# Patient Record
Sex: Male | Born: 1940 | Race: White | Hispanic: No | Smoking: Current every day smoker
Health system: Southern US, Community
[De-identification: ages and names within clinical notes are randomized; demographics above are authoritative.]

## PROBLEM LIST (undated history)

## (undated) DIAGNOSIS — K219 Gastro-esophageal reflux disease without esophagitis: Secondary | ICD-10-CM

## (undated) DIAGNOSIS — N400 Enlarged prostate without lower urinary tract symptoms: Secondary | ICD-10-CM

## (undated) DIAGNOSIS — D494 Neoplasm of unspecified behavior of bladder: Secondary | ICD-10-CM

## (undated) DIAGNOSIS — J449 Chronic obstructive pulmonary disease, unspecified: Secondary | ICD-10-CM

## (undated) DIAGNOSIS — Z8719 Personal history of other diseases of the digestive system: Secondary | ICD-10-CM

## (undated) DIAGNOSIS — Z8711 Personal history of peptic ulcer disease: Secondary | ICD-10-CM

## (undated) DIAGNOSIS — F419 Anxiety disorder, unspecified: Secondary | ICD-10-CM

## (undated) DIAGNOSIS — M549 Dorsalgia, unspecified: Secondary | ICD-10-CM

## (undated) DIAGNOSIS — R51 Headache: Secondary | ICD-10-CM

## (undated) DIAGNOSIS — G8929 Other chronic pain: Secondary | ICD-10-CM

## (undated) DIAGNOSIS — E7800 Pure hypercholesterolemia, unspecified: Secondary | ICD-10-CM

## (undated) DIAGNOSIS — Z720 Tobacco use: Secondary | ICD-10-CM

## (undated) DIAGNOSIS — E785 Hyperlipidemia, unspecified: Secondary | ICD-10-CM

## (undated) DIAGNOSIS — C801 Malignant (primary) neoplasm, unspecified: Secondary | ICD-10-CM

## (undated) DIAGNOSIS — R911 Solitary pulmonary nodule: Secondary | ICD-10-CM

## (undated) DIAGNOSIS — F32A Depression, unspecified: Secondary | ICD-10-CM

## (undated) DIAGNOSIS — J439 Emphysema, unspecified: Secondary | ICD-10-CM

## (undated) DIAGNOSIS — F329 Major depressive disorder, single episode, unspecified: Secondary | ICD-10-CM

## (undated) HISTORY — PX: CHOLECYSTECTOMY: SHX55

## (undated) HISTORY — DX: Major depressive disorder, single episode, unspecified: F32.9

## (undated) HISTORY — DX: Personal history of other diseases of the digestive system: Z87.19

## (undated) HISTORY — PX: FRACTURE SURGERY: SHX138

## (undated) HISTORY — DX: Depression, unspecified: F32.A

## (undated) HISTORY — DX: Personal history of peptic ulcer disease: Z87.11

---

## 1999-01-22 ENCOUNTER — Encounter: Admission: RE | Admit: 1999-01-22 | Discharge: 1999-02-01 | Payer: Self-pay

## 2000-08-18 ENCOUNTER — Emergency Department (HOSPITAL_COMMUNITY): Admission: EM | Admit: 2000-08-18 | Discharge: 2000-08-19 | Payer: Self-pay | Admitting: Emergency Medicine

## 2000-08-19 ENCOUNTER — Encounter (INDEPENDENT_AMBULATORY_CARE_PROVIDER_SITE_OTHER): Payer: Self-pay | Admitting: Specialist

## 2000-08-19 ENCOUNTER — Inpatient Hospital Stay (HOSPITAL_COMMUNITY): Admission: EM | Admit: 2000-08-19 | Discharge: 2000-08-22 | Payer: Self-pay

## 2000-08-19 ENCOUNTER — Encounter: Payer: Self-pay | Admitting: Pulmonary Disease

## 2000-08-20 ENCOUNTER — Encounter: Payer: Self-pay | Admitting: Pulmonary Disease

## 2000-09-18 ENCOUNTER — Ambulatory Visit: Admission: RE | Admit: 2000-09-18 | Discharge: 2000-09-18 | Payer: Self-pay | Admitting: Pulmonary Disease

## 2000-09-18 ENCOUNTER — Encounter: Payer: Self-pay | Admitting: Pulmonary Disease

## 2002-07-06 ENCOUNTER — Encounter: Payer: Self-pay | Admitting: *Deleted

## 2002-07-07 ENCOUNTER — Encounter: Payer: Self-pay | Admitting: Pulmonary Disease

## 2002-07-07 ENCOUNTER — Inpatient Hospital Stay (HOSPITAL_COMMUNITY): Admission: EM | Admit: 2002-07-07 | Discharge: 2002-07-11 | Payer: Self-pay | Admitting: Emergency Medicine

## 2002-07-08 ENCOUNTER — Encounter (INDEPENDENT_AMBULATORY_CARE_PROVIDER_SITE_OTHER): Payer: Self-pay | Admitting: *Deleted

## 2002-07-08 ENCOUNTER — Encounter: Payer: Self-pay | Admitting: Pulmonary Disease

## 2002-07-22 ENCOUNTER — Encounter: Payer: Self-pay | Admitting: Orthopedic Surgery

## 2002-07-22 ENCOUNTER — Observation Stay (HOSPITAL_COMMUNITY): Admission: RE | Admit: 2002-07-22 | Discharge: 2002-07-23 | Payer: Self-pay | Admitting: Orthopedic Surgery

## 2003-06-02 ENCOUNTER — Observation Stay (HOSPITAL_COMMUNITY): Admission: EM | Admit: 2003-06-02 | Discharge: 2003-06-02 | Payer: Self-pay | Admitting: Emergency Medicine

## 2003-12-29 ENCOUNTER — Observation Stay (HOSPITAL_COMMUNITY): Admission: EM | Admit: 2003-12-29 | Discharge: 2003-12-30 | Payer: Self-pay | Admitting: Emergency Medicine

## 2004-07-15 ENCOUNTER — Observation Stay (HOSPITAL_COMMUNITY): Admission: EM | Admit: 2004-07-15 | Discharge: 2004-07-16 | Payer: Self-pay | Admitting: Emergency Medicine

## 2004-08-03 ENCOUNTER — Ambulatory Visit: Payer: Self-pay | Admitting: Internal Medicine

## 2004-08-03 ENCOUNTER — Emergency Department (HOSPITAL_COMMUNITY): Admission: EM | Admit: 2004-08-03 | Discharge: 2004-08-03 | Payer: Self-pay | Admitting: Emergency Medicine

## 2004-08-10 ENCOUNTER — Encounter: Payer: Self-pay | Admitting: Pulmonary Disease

## 2004-08-10 ENCOUNTER — Ambulatory Visit: Payer: Self-pay | Admitting: Internal Medicine

## 2005-04-17 ENCOUNTER — Inpatient Hospital Stay (HOSPITAL_COMMUNITY): Admission: EM | Admit: 2005-04-17 | Discharge: 2005-04-18 | Payer: Self-pay | Admitting: Emergency Medicine

## 2005-04-17 ENCOUNTER — Ambulatory Visit: Payer: Self-pay | Admitting: Cardiology

## 2009-12-16 ENCOUNTER — Encounter: Payer: Self-pay | Admitting: Pulmonary Disease

## 2009-12-16 ENCOUNTER — Emergency Department (HOSPITAL_COMMUNITY): Admission: EM | Admit: 2009-12-16 | Discharge: 2009-12-16 | Payer: Self-pay | Admitting: Emergency Medicine

## 2009-12-21 ENCOUNTER — Encounter: Payer: Self-pay | Admitting: Pulmonary Disease

## 2009-12-24 ENCOUNTER — Emergency Department (HOSPITAL_COMMUNITY): Admission: EM | Admit: 2009-12-24 | Discharge: 2009-12-24 | Payer: Self-pay | Admitting: Emergency Medicine

## 2009-12-26 DIAGNOSIS — J449 Chronic obstructive pulmonary disease, unspecified: Secondary | ICD-10-CM

## 2009-12-26 DIAGNOSIS — I10 Essential (primary) hypertension: Secondary | ICD-10-CM | POA: Insufficient documentation

## 2009-12-26 DIAGNOSIS — J4489 Other specified chronic obstructive pulmonary disease: Secondary | ICD-10-CM | POA: Insufficient documentation

## 2009-12-26 DIAGNOSIS — M1990 Unspecified osteoarthritis, unspecified site: Secondary | ICD-10-CM | POA: Insufficient documentation

## 2009-12-27 ENCOUNTER — Ambulatory Visit: Payer: Self-pay | Admitting: Pulmonary Disease

## 2009-12-27 DIAGNOSIS — J984 Other disorders of lung: Secondary | ICD-10-CM

## 2010-01-01 ENCOUNTER — Ambulatory Visit (HOSPITAL_COMMUNITY): Admission: RE | Admit: 2010-01-01 | Discharge: 2010-01-01 | Payer: Self-pay | Admitting: Pulmonary Disease

## 2010-01-03 ENCOUNTER — Encounter: Payer: Self-pay | Admitting: Pulmonary Disease

## 2010-01-09 ENCOUNTER — Ambulatory Visit (HOSPITAL_COMMUNITY): Admission: RE | Admit: 2010-01-09 | Discharge: 2010-01-09 | Payer: Self-pay | Admitting: Pulmonary Disease

## 2010-01-15 ENCOUNTER — Ambulatory Visit: Payer: Self-pay | Admitting: Pulmonary Disease

## 2010-01-19 ENCOUNTER — Ambulatory Visit: Payer: Self-pay | Admitting: Pulmonary Disease

## 2010-01-22 ENCOUNTER — Telehealth: Payer: Self-pay | Admitting: Pulmonary Disease

## 2010-01-29 ENCOUNTER — Telehealth: Payer: Self-pay | Admitting: Pulmonary Disease

## 2010-01-30 ENCOUNTER — Ambulatory Visit: Payer: Self-pay | Admitting: Cardiology

## 2010-01-31 ENCOUNTER — Ambulatory Visit: Payer: Self-pay | Admitting: Emergency Medicine

## 2010-01-31 ENCOUNTER — Ambulatory Visit (HOSPITAL_COMMUNITY): Admission: RE | Admit: 2010-01-31 | Discharge: 2010-01-31 | Payer: Self-pay | Admitting: Emergency Medicine

## 2010-02-08 ENCOUNTER — Telehealth (INDEPENDENT_AMBULATORY_CARE_PROVIDER_SITE_OTHER): Payer: Self-pay | Admitting: *Deleted

## 2010-02-09 ENCOUNTER — Ambulatory Visit: Payer: Self-pay | Admitting: Pulmonary Disease

## 2010-05-04 ENCOUNTER — Other Ambulatory Visit: Payer: Self-pay | Admitting: Pulmonary Disease

## 2010-05-04 DIAGNOSIS — R911 Solitary pulmonary nodule: Secondary | ICD-10-CM

## 2010-05-05 ENCOUNTER — Encounter: Payer: Self-pay | Admitting: Pulmonary Disease

## 2010-05-17 NOTE — Miscellaneous (Signed)
Summary: Orders Update   Clinical Lists Changes  Orders: Added new Referral order of Radiology Referral (Radiology) - Signed 

## 2010-05-17 NOTE — Assessment & Plan Note (Signed)
Summary: consult for RUL nodule.   Visit Type:  Initial Consult Copy to:  Prudy Feeler Primary Provider/Referring Provider:  Prudy Feeler MD  CC:  pulmonary consult. Pt finished prednisone taper yesterday.  History of Present Illness: The pt is a 70y/o male who I have been asked to see for a pulmonary nodule.  He recently presented to the ER for hoarseness, and underwent a ct neck that including a few cuts thru the lung apices.  This revealed a 7mm nodular density in the right apex with surrounding significant emphysematous changes.  He was subsequently referred for consultation.  The pt has a long h/o smoking, and continues to do so.  He has had a history of hemoptysis in the distant past, but has not been an issue recently.  He denies any weight loss or anorexia.  He denies any h/o known TB exposure, and has had a negative skin test in the past.  He has never lived outside of [place].  He has known moderate emphysema by pfts in 2002.  He has not been tested since.    Preventive Screening-Counseling & Management  Alcohol-Tobacco     Smoking Status: current     Smoking Cessation Counseling: yes     Packs/Day: 0.5     Tobacco Counseling: to quit use of tobacco products  Current Medications (verified): 1)  Xanax 1 Mg Tabs (Alprazolam) .... One Tab Two Times A Day 2)  Nitrostat 0.4 Mg Subl (Nitroglycerin) .... As Directed 3)  Prilosec 20 Mg Cpdr (Omeprazole) .... One Tab Two Times A Day 4)  Lortab 10-500 Mg Tabs (Hydrocodone-Acetaminophen) .... One Tab Three Times A Day 5)  Combivent 18-103 Mcg/act Aero (Ipratropium-Albuterol) .... 2 Puffs Four Times A Day As Needed 6)  Symbicort 160-4.5 Mcg/act Aero (Budesonide-Formoterol Fumarate) .... 2 Puffs Two Times A Day 7)  Mobic 7.5 Mg Tabs (Meloxicam) .... One Tab Once Daily 8)  Albuterol Sulfate (2.5 Mg/52ml) 0.083% Nebu (Albuterol Sulfate) .... Use One Vial Once Daily 9)  Amitriptyline Hcl 50 Mg Tabs (Amitriptyline Hcl) .Marland Kitchen.. 1 Tablet At Bedtime 10)   Calcium-Vitamin D 600-200 Mg-Unit Tabs (Calcium-Vitamin D) .... Once Daily 11)  Vitamin D3 1000 Unit Caps (Cholecalciferol) .... Once Daily 12)  Simvastatin 40 Mg Tabs (Simvastatin) .... Once Daily 13)  Garlic Oil 1000 Mg Caps (Garlic) .... Once Daily  Allergies (verified): 1)  ! * Ivp Dye  Past History:  Past Medical History: OSTEOARTHRITIS (ICD-715.90) HYPERTENSION (ICD-401.9) Moderate emphysema--pfts 2002 with Fev1% 60.  Past Surgical History: Finger surgery--pin placed in finger  Family History: Reviewed history and no changes required. Asthma--daughter  Social History: Reviewed history and no changes required. 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 Start 1952. 2-3 ppd. Pt states currently smokes <1/2ppd Smoking Status:  current Packs/Day:  0.5  Review of Systems       The patient complains of shortness of breath with activity, shortness of breath at rest, productive cough, sore throat, itching, and rash.  The patient denies non-productive cough, coughing up blood, chest pain, irregular heartbeats, acid heartburn, indigestion, loss of appetite, weight change, abdominal pain, difficulty swallowing, tooth/dental problems, headaches, nasal congestion/difficulty breathing through nose, sneezing, ear ache, anxiety, depression, hand/feet swelling, joint stiffness or pain, change in color of mucus, and fever.    Vital Signs:  Patient profile:   70 year old male Height:      64 inches Weight:      166 pounds BMI:     28.60 O2 Sat:  93 % on Room air Temp:     97.9 degrees F oral Pulse rate:   76 / minute BP sitting:   110 / 60  (right arm) Cuff size:   regular  Vitals Entered By: Carver Fila (December 27, 2009 2:13 PM)  O2 Flow:  Room air CC: pulmonary consult. Pt finished prednisone taper yesterday Comments meds and allergies updated Phone number updated Carver Fila  December 27, 2009 2:13 PM    Physical Exam  General:   wd male in nad Eyes:  PERRLA and EOMI.   Nose:  deviation to left without purulence or obstruction Mouth:  clear  Neck:  no jvd, tmg, LN Lungs:  decreased bs throughout, no wheezing  mild rhonchi Heart:  rrr, no mrg Abdomen:  soft and nontender, bs+ Extremities:  no edema noted, no cyanosis  pulses decreased but present Neurologic:  alert and oriented, moves all 4.   Impression & Recommendations:  Problem # 1:  PULMONARY NODULE (ICD-518.89) the pt has a 7mm nodule in right apex on ct neck as an incidental finding.  It is unclear whether this is a scar vs possible early bronchogenic cancer.  I have outlined the 2 paths for management, including surveillance vs biopsy.  It is too small to be accurately characterized by PET, but the pt does need to have a complete ct chest to make sure no other abnormal findings in the chest.  If his abnormalities are isolated to those seen on neck ct, I have recommened a f/u ct chest at 4mos, then 6mos, then one year.  If he wished to be more aggressive, I would be hesitant to do TTNA due to high ptx risk, would not do surgical wedge resection without knowing it was definitely a cancer, and could approach with ENB (which would still have ptx risk with surrounding emphysematous changes).  After a long discussion with the pt and his son, the decision was made to do full ct chest, then do surveillance if nothing new is found.  He understands that we cannot exclude cancer, and that he needs close f/u.  I have also talked with him about his smoking, and asked him to quit.  Medications Added to Medication List This Visit: 1)  Amitriptyline Hcl 50 Mg Tabs (Amitriptyline hcl) .Marland Kitchen.. 1 tablet at bedtime 2)  Calcium-vitamin D 600-200 Mg-unit Tabs (Calcium-vitamin d) .... Once daily 3)  Vitamin D3 1000 Unit Caps (Cholecalciferol) .... Once daily 4)  Simvastatin 40 Mg Tabs (Simvastatin) .... Once daily 5)  Garlic Oil 1000 Mg Caps (Garlic) .... Once daily  Other  Orders: Consultation Level IV (66440) Radiology Referral (Radiology)  Patient Instructions: 1)  will set up for full ct chest.  Will call you with results.  If unrremarkable except for your known spot, will do followup scan in 4mos.  Will call to discuss with you 2)  stop smoking.   Immunization History:  Influenza Immunization History:    Influenza:  historical (01/13/2009)   Appended Document: consult for RUL nodule. the pt's ct neck showed the pulmonary nodule was 11mm in diameter, not 7mm (that was thyroid lesion). await complete ct chest.

## 2010-05-17 NOTE — Miscellaneous (Signed)
Summary: Orders Update-pft charges//jwr  Clinical Lists Changes  Orders: Added new Service order of Carbon Monoxide diffusing w/capacity (94720) - Signed Added new Service order of Lung Volumes (94240) - Signed Added new Service order of Spirometry (Pre & Post) (94060) - Signed 

## 2010-05-17 NOTE — Assessment & Plan Note (Signed)
Summary: rov for copd, RUL nodules   Visit Type:  Follow-up Copy to:  Prudy Feeler Primary Yanni Quiroa/Referring Coretta Leisey:  Prudy Feeler MD  CC:  pt here to discuss options. pt is not having any problems. pt currently smokes 1/2 ppd. .  History of Present Illness: The pt comes in today for f/u of his RUL abnormality on ct chest.  He has recently undergone ENB, which was nondiagnostic, and is here to review further options for treatment. He has not had any complications from his recent procedure, and feels he is breathing well.  Current Medications (verified): 1)  Xanax 1 Mg Tabs (Alprazolam) .... One Tab Two Times A Day 2)  Nitrostat 0.4 Mg Subl (Nitroglycerin) .... As Directed 3)  Prilosec 20 Mg Cpdr (Omeprazole) .... One Tab Two Times A Day 4)  Lortab 10-500 Mg Tabs (Hydrocodone-Acetaminophen) .... One Tab Three Times A Day 5)  Combivent 18-103 Mcg/act Aero (Ipratropium-Albuterol) .... 2 Puffs Four Times A Day As Needed 6)  Symbicort 160-4.5 Mcg/act Aero (Budesonide-Formoterol Fumarate) .... 2 Puffs Two Times A Day 7)  Mobic 7.5 Mg Tabs (Meloxicam) .... One Tab Once Daily 8)  Albuterol Sulfate (2.5 Mg/26ml) 0.083% Nebu (Albuterol Sulfate) .... Use One Vial Once Daily 9)  Amitriptyline Hcl 50 Mg Tabs (Amitriptyline Hcl) .Marland Kitchen.. 1 Tablet At Bedtime 10)  Calcium-Vitamin D 600-200 Mg-Unit Tabs (Calcium-Vitamin D) .... Once Daily 11)  Vitamin D3 1000 Unit Caps (Cholecalciferol) .... Once Daily 12)  Simvastatin 40 Mg Tabs (Simvastatin) .... Once Daily 13)  Garlic Oil 1000 Mg Caps (Garlic) .... Once Daily  Allergies (verified): 1)  ! * Ivp Dye  Review of Systems       The patient complains of productive cough, non-productive cough, and nasal congestion/difficulty breathing through nose.  The patient denies shortness of breath with activity, shortness of breath at rest, coughing up blood, chest pain, irregular heartbeats, acid heartburn, indigestion, loss of appetite, weight change, abdominal pain,  difficulty swallowing, sore throat, tooth/dental problems, headaches, sneezing, itching, ear ache, anxiety, depression, hand/feet swelling, joint stiffness or pain, rash, change in color of mucus, and fever.    Vital Signs:  Patient profile:   70 year old male Height:      64 inches Weight:      163.50 pounds BMI:     28.17 O2 Sat:      93 % on Room air Temp:     97.7 degrees F oral Pulse rate:   75 / minute BP sitting:   120 / 70  (left arm) Cuff size:   regular  Vitals Entered By: Carver Fila (February 09, 2010 2:40 PM)  O2 Flow:  Room air CC: pt here to discuss options. pt is not having any problems. pt currently smokes 1/2 ppd.  Comments meds and allergies updated Phone number updated Carver Fila  February 09, 2010 2:43 PM    Physical Exam  General:  wd male in nad Lungs:  decreased bs but no wheezing Heart:  rrr Extremities:  no edema or cyanosis  Neurologic:  alert and oriented, moves all 4.   Impression & Recommendations:  Problem # 1:  PULMONARY NODULE (ICD-518.89) the pt has a RUL nodule that appears more inflammatory on ct chest, but does have an increased PET signature (though at lower level).  His ENB was nondiagnostic, and would not do a TTNA given his severe emphysematous changes with high risk for PTX and BPF.  He is not a surgical candidate currently given his severe  obstruction on pfts, but these may improve if he were to totally quit smoking.  At this point, would do surveillance in a short time interval.  I am still not convinced this represents a malignancy, but will follow closely.  The pt is totally agreeable to this approach   Problem # 2:  C O P D (ICD-496) the pt has severe disease by pfts, but is still smoking.  I have suggested to him that his pfts would probably improve with smoking cessation, and may make him more of a surgical candidate in the future.  I have asked him to continue on his current meds.  Other Orders: Est. Patient Level III  (62952) Radiology Referral (Radiology)  Patient Instructions: 1)  will do a followup ct chest in 4mos.  Will call you with results. 2)  stop smoking.

## 2010-05-17 NOTE — Assessment & Plan Note (Signed)
Summary: rov to discuss pet scan/ms   Primary Provider/Referring Provider:  Prudy Feeler MD  CC:  pt is here to discuss his pet scan results. Pt states he smoke 2-3 cigarettes a day. Marland Kitchen  History of Present Illness: the pt comes in today for f/u of his recent PET scan.  He has 2 areas in the RUL, but the more concerning one more anteriorly in RUL was not PET +,while the lesion more posteriorly in RUL did have increased uptake.  I have discussed this at great length with the pt, and answered all of his questions.  I have also reviewed the images with him and his family member.  Current Medications (verified): 1)  Xanax 1 Mg Tabs (Alprazolam) .... One Tab Two Times A Day 2)  Nitrostat 0.4 Mg Subl (Nitroglycerin) .... As Directed 3)  Prilosec 20 Mg Cpdr (Omeprazole) .... One Tab Two Times A Day 4)  Lortab 10-500 Mg Tabs (Hydrocodone-Acetaminophen) .... One Tab Three Times A Day 5)  Combivent 18-103 Mcg/act Aero (Ipratropium-Albuterol) .... 2 Puffs Four Times A Day As Needed 6)  Symbicort 160-4.5 Mcg/act Aero (Budesonide-Formoterol Fumarate) .... 2 Puffs Two Times A Day 7)  Mobic 7.5 Mg Tabs (Meloxicam) .... One Tab Once Daily 8)  Albuterol Sulfate (2.5 Mg/69ml) 0.083% Nebu (Albuterol Sulfate) .... Use One Vial Once Daily 9)  Amitriptyline Hcl 50 Mg Tabs (Amitriptyline Hcl) .Marland Kitchen.. 1 Tablet At Bedtime 10)  Calcium-Vitamin D 600-200 Mg-Unit Tabs (Calcium-Vitamin D) .... Once Daily 11)  Vitamin D3 1000 Unit Caps (Cholecalciferol) .... Once Daily 12)  Simvastatin 40 Mg Tabs (Simvastatin) .... Once Daily 13)  Garlic Oil 1000 Mg Caps (Garlic) .... Once Daily  Allergies (verified): 1)  ! * Ivp Dye  Review of Systems       The patient complains of productive cough and sneezing.  The patient denies shortness of breath with activity, shortness of breath at rest, non-productive cough, coughing up blood, chest pain, irregular heartbeats, acid heartburn, indigestion, loss of appetite, weight change, abdominal  pain, difficulty swallowing, sore throat, tooth/dental problems, headaches, nasal congestion/difficulty breathing through nose, ear ache, anxiety, depression, hand/feet swelling, joint stiffness or pain, rash, change in color of mucus, and fever.    Vital Signs:  Patient profile:   70 year old male Height:      64 inches Weight:      162.38 pounds O2 Sat:      95 % on Room air Temp:     97.8 degrees F oral Pulse rate:   81 / minute BP sitting:   118 / 72  (right arm) Cuff size:   regular  Vitals Entered By: Carver Fila (January 15, 2010 1:26 PM)  O2 Flow:  Room air CC: pt is here to discuss his pet scan results. Pt states he smoke 2-3 cigarettes a day.  Comments meds and allergies updated Phone number updated Carver Fila  January 15, 2010 1:31 PM    Physical Exam  General:  wd male in nad   Impression & Recommendations:  Problem # 1:  PULMONARY NODULE (ICD-518.89) the pt has 2 abnormal areas in the RUL, with one showing increased uptake on PET.  This is very concerning for a malignancy.  I have reviewed the various approaches with the pt, including surveillance, TTNA, ENB, and surgery.  He obviously has significant emphysema by xray and on clinical grounds, therefore will need a tissue diagnosis if possible before considering any type of surgical intervention (will also need  pfts to see if even a candidate).  I think he is at high risk for ptx and possible persistent air leak with TTNA, given the surrounding blebs btw lesion and chest wall.  I think the safest and best approach is ENB.  I have reviewed the procedure with him, and he agrees.  He understands there is still a risk of ptx, but much less than TTNA.  Time spent with pt discussing the above was .  Other Orders: Est. Patient Level III (96295) Pulmonary Referral (Pulmonary)  Patient Instructions: 1)  will schedule for breathing studies, and will let you know the results. 2)  will schedule for bronchoscopy with GPS  guidance.  I will call you with the scheduling date.   Immunization History:  Pneumovax Immunization History:    Pneumovax:  historical (01/13/2009)

## 2010-05-17 NOTE — Progress Notes (Signed)
Summary: pt coming in for ov  Phone Note Call from Patient Call back at Home Phone [phone]   Caller: Patient Call For: clance Summary of Call: returning call to mindy Initial call taken by: Lacinda Axon,  February 08, 2010 12:54 PM  Follow-up for Phone Call        called and spoke with pt an trying to figure out a time for pt to come in and see [facility]. sent a flag to k[facility] and awaiting for reply.  Carver Fila  February 08, 2010 1:41 PM   Additional Follow-up for Phone Call Additional follow up Details #1::        pt coming in on 10/28 at 2:45. pt aware of ov Carver Fila  February 08, 2010 4:46 PM

## 2010-05-17 NOTE — Progress Notes (Signed)
  Phone Note Call from Patient Call back at Home Phone [phone]   Caller: Archie Patten- FRIEND OF PT Call For: [facility] Summary of Call: REQUESTS TO SPEAK TO LIBBY. PT SAYS SOMEONE FROM [facility] CALLED RE: "LUNG" SURGERY DATE FOR 10/19/? AND SAYS THAT PT IS UNAWARE OF PENDING SURGERY.( PT WAS TALKING IN THE BACKGROUND AS TANYA WAS SPEAKING TO ME) Initial call taken by: Tivis Ringer, CNA,  January 22, 2010 12:07 PM  Follow-up for Phone Call        someone fr[facility] called this am and needed info from him for surgery on 01/31/10 and they are upset don't know what is going on ct [email or web address]  01/25/10 Follow-up by: Oneita Jolly,  January 22, 2010 12:15 PM  Additional Follow-up for Phone Call Additional follow up Details #1::        called and left message for patient on machine explaining that surgery didn't mean an operation.  explained this was his bronchoscopy that we have already discussed.  Will try to call him back. Additional Follow-up by: Barbaraann Share MD,  January 22, 2010 4:48 PM

## 2010-05-17 NOTE — Progress Notes (Signed)
Summary: ? about appt-pt and son called  Phone Note Call from Patient Call back at [facility] Phone [phone]   Caller: Patient Call For: clance Summary of Call: pt have questions about were he is suppose to have appt and labs. Initial call taken by: Rickard Patience,  January 29, 2010 11:13 AM  Follow-up for Phone Call        son tuff clabo called back re: same. says pt believes he is to have labs done today. is this correct? call same # (pt. home). Tivis Ringer, CNA  January 29, 2010 11:38 AM  Dr. Shelle Iron is aware of this and is takin gcare of this pt.Jennifer [facility] CMA  January 29, 2010 12:45 PM

## 2010-05-17 NOTE — Progress Notes (Signed)
Summary: [facility]   Imported By: Sherian Rein 12/28/2009 09:15:33  _____________________________________________________________________  External Attachment:    Type:   Image     Comment:   External Document

## 2010-05-25 ENCOUNTER — Telehealth (INDEPENDENT_AMBULATORY_CARE_PROVIDER_SITE_OTHER): Payer: Self-pay | Admitting: *Deleted

## 2010-05-28 ENCOUNTER — Other Ambulatory Visit (HOSPITAL_COMMUNITY): Payer: Self-pay

## 2010-05-31 NOTE — Progress Notes (Signed)
Summary: needs physician orders faxed for CT on 2.13.12  Phone Note Other Incoming Call back at [phone]   Caller: Rebecka Apley w/  [facility] radiology Summary of Call: CT scheduled for Monday @ 1130.  needs physician orders faxed to [phone].  per EMR, order was faxed on 1.30.12, but Rebecka Apley states with all the "transitions" it was not received.  [facility] pt. Initial call taken by: Boone Master CNA/MA,  May 25, 2010 2:38 PM  Follow-up for Phone Call        will forward message to [facility] to resend order for CT chest.  Arman Filter LPN  May 25, 2010 3:00 PM   Additional Follow-up for Phone Call Additional follow up Details #1::        order was faxed 02/09/10 but refaxed it to [phone] Additional Follow-up by: Oneita Jolly,  May 25, 2010 3:24 PM

## 2010-06-15 ENCOUNTER — Other Ambulatory Visit (HOSPITAL_COMMUNITY): Payer: Self-pay

## 2010-06-21 ENCOUNTER — Ambulatory Visit (HOSPITAL_COMMUNITY)
Admission: RE | Admit: 2010-06-21 | Discharge: 2010-06-21 | Disposition: A | Discharge status: Home / Self Care | Payer: Medicare Other | Source: Ambulatory Visit | Attending: Pulmonary Disease | Admitting: Pulmonary Disease

## 2010-06-21 DIAGNOSIS — J984 Other disorders of lung: Secondary | ICD-10-CM | POA: Insufficient documentation

## 2010-06-21 DIAGNOSIS — R911 Solitary pulmonary nodule: Secondary | ICD-10-CM

## 2010-06-21 DIAGNOSIS — J438 Other emphysema: Secondary | ICD-10-CM | POA: Insufficient documentation

## 2010-06-27 LAB — BASIC METABOLIC PANEL
BUN: 5 mg/dL — ABNORMAL LOW (ref 6–23)
CO2: 31 mEq/L (ref 19–32)
Calcium: 9.7 mg/dL (ref 8.4–10.5)
GFR calc non Af Amer: >60 mL/min (ref >60)
Glucose, Bld: 86 mg/dL (ref 70–99)
Potassium: 4.3 mEq/L (ref 3.5–5.1)

## 2010-06-27 LAB — CBC
HCT: 47 % (ref 39.0–52.0)
Hemoglobin: 16.2 g/dL (ref 13.0–17.0)
MCH: 32.3 pg (ref 26.0–34.0)
MCHC: 34.5 g/dL (ref 30.0–36.0)
RDW: 13.9 % (ref 11.5–15.5)

## 2010-06-27 LAB — AFB STAIN

## 2010-06-27 LAB — PROTIME-INR: INR: 0.88 (ref 0.00–1.49)

## 2010-06-28 LAB — BASIC METABOLIC PANEL
BUN: 13 mg/dL (ref 6–23)
Creatinine, Ser: 0.89 mg/dL (ref 0.4–1.5)
GFR calc non Af Amer: >60 mL/min (ref >60)
Potassium: 3.5 mEq/L (ref 3.5–5.1)

## 2010-06-28 LAB — GLUCOSE, CAPILLARY: Glucose-Capillary: 109 mg/dL — ABNORMAL HIGH (ref 70–99)

## 2010-06-28 LAB — CBC
HCT: 46 % (ref 39.0–52.0)
Platelets: 208 10*3/uL (ref 150–400)
RDW: 14.6 % (ref 11.5–15.5)
WBC: 12 10*3/uL — ABNORMAL HIGH (ref 4.0–10.5)

## 2010-06-28 LAB — DIFFERENTIAL
Basophils Absolute: 0.1 10*3/uL (ref 0.0–0.1)
Lymphocytes Relative: 32 % (ref 12–46)
Neutro Abs: 6.9 10*3/uL (ref 1.7–7.7)
Neutrophils Relative %: 58 % (ref 43–77)

## 2010-07-11 ENCOUNTER — Emergency Department (HOSPITAL_COMMUNITY)
Admission: EM | Admit: 2010-07-11 | Discharge: 2010-07-11 | Disposition: A | Discharge status: Home / Self Care | Payer: Medicare Other | Attending: Emergency Medicine | Admitting: Emergency Medicine

## 2010-07-11 ENCOUNTER — Emergency Department (HOSPITAL_COMMUNITY): Payer: Medicare Other

## 2010-07-11 DIAGNOSIS — E7800 Pure hypercholesterolemia, unspecified: Secondary | ICD-10-CM | POA: Insufficient documentation

## 2010-07-11 DIAGNOSIS — Z79899 Other long term (current) drug therapy: Secondary | ICD-10-CM | POA: Insufficient documentation

## 2010-07-11 DIAGNOSIS — R0602 Shortness of breath: Secondary | ICD-10-CM | POA: Insufficient documentation

## 2010-07-11 DIAGNOSIS — R209 Unspecified disturbances of skin sensation: Secondary | ICD-10-CM | POA: Insufficient documentation

## 2010-07-11 DIAGNOSIS — J438 Other emphysema: Secondary | ICD-10-CM | POA: Insufficient documentation

## 2010-07-11 DIAGNOSIS — F411 Generalized anxiety disorder: Secondary | ICD-10-CM | POA: Insufficient documentation

## 2010-07-11 DIAGNOSIS — J45909 Unspecified asthma, uncomplicated: Secondary | ICD-10-CM | POA: Insufficient documentation

## 2010-07-11 DIAGNOSIS — K219 Gastro-esophageal reflux disease without esophagitis: Secondary | ICD-10-CM | POA: Insufficient documentation

## 2010-07-11 DIAGNOSIS — R079 Chest pain, unspecified: Secondary | ICD-10-CM | POA: Insufficient documentation

## 2010-07-11 LAB — POCT CARDIAC MARKERS
CKMB, poc: 1.8 ng/mL (ref 1.0–8.0)
CKMB, poc: 1.1 ng/mL (ref 1.0–8.0)
Myoglobin, poc: 93.3 ng/mL (ref 12–200)
Troponin i, poc: <0.05 ng/mL (ref 0.00–0.09)

## 2010-07-11 LAB — CBC
MCHC: 35.2 g/dL (ref 30.0–36.0)
MCV: 90.9 fL (ref 78.0–100.0)
Platelets: 215 10*3/uL (ref 150–400)
RDW: 14.4 % (ref 11.5–15.5)
WBC: 9.4 10*3/uL (ref 4.0–10.5)

## 2010-07-11 LAB — BASIC METABOLIC PANEL
BUN: 9 mg/dL (ref 6–23)
Creatinine, Ser: 0.82 mg/dL (ref 0.4–1.5)
GFR calc non Af Amer: >60 mL/min (ref >60)

## 2010-07-11 LAB — DIFFERENTIAL
Basophils Relative: 1 % (ref 0–1)
Eosinophils Absolute: 0.3 10*3/uL (ref 0.0–0.7)
Lymphs Abs: 3.6 10*3/uL (ref 0.7–4.0)
Monocytes Absolute: 1.2 10*3/uL — ABNORMAL HIGH (ref 0.1–1.0)
Neutrophils Relative %: 45 % (ref 43–77)

## 2010-07-12 ENCOUNTER — Emergency Department (HOSPITAL_COMMUNITY)
Admission: EM | Admit: 2010-07-12 | Discharge: 2010-07-12 | Disposition: A | Discharge status: Home / Self Care | Payer: Medicare Other | Attending: Emergency Medicine | Admitting: Emergency Medicine

## 2010-07-12 DIAGNOSIS — R071 Chest pain on breathing: Secondary | ICD-10-CM | POA: Insufficient documentation

## 2010-07-12 DIAGNOSIS — F411 Generalized anxiety disorder: Secondary | ICD-10-CM | POA: Insufficient documentation

## 2010-07-12 DIAGNOSIS — J9801 Acute bronchospasm: Secondary | ICD-10-CM | POA: Insufficient documentation

## 2010-07-12 LAB — COMPREHENSIVE METABOLIC PANEL
BUN: 9 mg/dL (ref 6–23)
CO2: 33 mEq/L — ABNORMAL HIGH (ref 19–32)
Calcium: 9.8 mg/dL (ref 8.4–10.5)
Creatinine, Ser: 0.82 mg/dL (ref 0.4–1.5)
GFR calc non Af Amer: >60 mL/min (ref >60)
Glucose, Bld: 112 mg/dL — ABNORMAL HIGH (ref 70–99)
Sodium: 128 mEq/L — ABNORMAL LOW (ref 135–145)
Total Protein: 7 g/dL (ref 6.0–8.3)

## 2010-07-12 LAB — DIFFERENTIAL
Basophils Absolute: 0 10*3/uL (ref 0.0–0.1)
Eosinophils Relative: 1 % (ref 0–5)
Lymphocytes Relative: 30 % (ref 12–46)
Monocytes Absolute: 0.8 10*3/uL (ref 0.1–1.0)
Monocytes Relative: 8 % (ref 3–12)

## 2010-07-12 LAB — CBC
HCT: 46.6 % (ref 39.0–52.0)
MCH: 31.5 pg (ref 26.0–34.0)
MCHC: 35.2 g/dL (ref 30.0–36.0)
MCV: 89.4 fL (ref 78.0–100.0)
RDW: 14 % (ref 11.5–15.5)

## 2010-07-12 LAB — POCT CARDIAC MARKERS
CKMB, poc: 1.3 ng/mL (ref 1.0–8.0)
Myoglobin, poc: 97.6 ng/mL (ref 12–200)
Troponin i, poc: <0.05 ng/mL (ref 0.00–0.09)

## 2010-08-31 NOTE — Cardiovascular Report (Signed)
NAMEUSBALDO, PANNONE                            ACCOUNT NO.:  [id number]   MEDICAL RECORD NO.:  [id number]                   PATIENT TYPE:  INP   LOCATION:  6524                                 FACILITY:  [facility]   PHYSICIAN:  Richard A. Alanda Amass, M.D.          DATE OF BIRTH:  1941/01/17   DATE OF PROCEDURE:  12/29/2003  DATE OF DISCHARGE:                              CARDIAC CATHETERIZATION   PROCEDURE:  Retrograde central aortic catheterization, left coronary  angiography via Judkins technique, left coronary angiogram, RAO LAO  projection, abdominal angiogram hand injection.  IC nitroglycerin  administration.   BRIEF HISTORY:  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 Sung Amabile with hemoptysis March 2004.  He is  followed by Dr. Michell Heinrich for atypical chest pain, that has been  intermittent.  He also has a history of hyperlipidemia on medical therapy,  and depression and anxiety.  He had a negative Cardiolite for ischemia  February 2005.  He was admitted to the hospital by Dr. Effie Shy on December 29, 2003 for prolonged substernal pressure and burning, myocardial  infarction was ruled out by serial enzymes and EKGs and he was referred for  a catheterization.  Informed consent was obtained, preoperative laboratory  was normal.  The patient was brought to the Second Floor CP Lab in a post  absorptive state after 5 mg of Valium  p.o.  He was given 2 mg of Versed for  sedation in the laboratory.  1% Xylocaine was used for local anesthesia.  The CFRA was entered with single anterior puncture using an 18 thin wall  needle and a 6 [place] __________ sheath was inserted without difficulty.  Catheterization was done with 6 French 4 cm taper, preformed coronary and  pigtail catheters.  LV angiogram was done in the RAO and LAO projection at  25 mL, 14 mL per second and 20 mL,  12 mL per second.  Pullback pressure to  CA was performed and showed no gradient across the aortic valve.  Hand  injection of the abdominal aorta showed single normal patent renal arteries  bilaterally with mild infrarenal atherosclerotic disease, but no evidence of  aneurysm or stenosis, unlimited injection.  The patient was given IC  nitroglycerin 200 mcg during the diagnostic procedure followed with repeat  left coronary angiogram.  The patient tolerated the procedure well.  He was  transferred to the holding area for sheath removal with pressure hemostasis  in stable condition.   Pressures:  LV:  120/0; LVEDP 16 mmHg   CA:  120/60/80 mmHg.   There was no gradient across the aortic valve on catheter pullback.   LV angiogram in the RAO and LAO projection showing a vigorously contracting  ventricle with EF approximately 60% and no mitral regurgitation.   Fluoroscopy showed +1 calcification of  the LAD and proximal RCA.  There was  no intracardiac or valvular calcification.   The main left coronary was normal.   The left anterior descending artery had 40-50% fairly smooth, mildly  segmental, systolic narrowing.  In diastole there was approximately 20-30%  in systole, there was 40-50%.  There was good flow to the distal LAD which  coursed to the apex of the heart and bifurcated and was large.   The first diagonal branch was large and bifurcated twice and had no  significant stenosis arising after the first septal perforator.   The circumflex was moderate size, but nondominant.  There was a normal small  OM-1.  There was 30-40% narrowing that was fairly smooth between OM-1 and OM-  2 and the proximal circumflex and another 40% or less narrowing of the  circumflex beyond OM-2.  The remainder of the vessel bifurcated distally  into two moderately large marginal branches that were normal.  The OM-2 had  40% proximal narrowing.   The right coronary was a large dominant vessel.  There  was triple tandem 30%  eccentric, but relatively smooth lesions in the proximal third with  excellent residual lumen in the large vessel.  The remainder of the vessel  was large, widely patent with a normal bifurcating PLA and a normal PDA and  RV branches.   DISCUSSION:  This 70 year old gentleman has chronic sinusitis and a history  of chest pains some of which are compatible with reflux.  He has a  longstanding history of smoking and has chronic bronchitis and chronic  persistent cough.   Catheterization shows noncritical coronary disease with minor disease of the  proximal right circumflex and OM-2 and minor disease of the mid LAD with 40-  50% systolic narrowing (possibly systolic bridging) of the mid LAD.  I would  try to treat this patient with low dose beta-blockers for his midsystolic  left anterior descending artery narrowing if he can tolerate it from the  chronic obstructive pulmonary disease standpoint.  If he cannot, then we  might want to try a low dose calcium blocker.  I do not think this is the  cause of his chest pain however, and I think his chest pain is more likely  of other etiology, and possibly gastrointestinal.  We will institute empiric  therapy now.  Strong counseling of discontinuation of smoking which is  obviously has also been done in the past.   CATHETERIZATION DIAGNOSES:  1.  Chest pain etiology not determined.  2.  Noncritical coronary disease as outlined above.  3.  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  4.  Normal left ventricular function.  5.  Chronic obstructive pulmonary disease, chronic bronchitis -- chronic      cough, past history of hemoptysis felt to be asthmatic bronchitis with      hospitalization March 2004.  6.  Hyperlipidemia.  7.  Continued cigarette abuse.  8.  Gastroesophageal reflux disease.                                              Richard A. Alanda Amass, M.D.    RAW/MEDQ   D:  12/29/2003  T:  12/29/2003  Job:  [id number]   cc:   Oley Balm. Sung Amabile, M.D. [facility]   Prudy Feeler, MD  Kristine Garbe  [place]   Dani Gobble, MD  Fax: [phone]

## 2010-08-31 NOTE — Discharge Summary (Signed)
NAMEMarland Kitchen  [name], [name] NO.:  [id number]   MEDICAL RECORD NO.:  [id number]          PATIENT TYPE:  OBV   LOCATION:  2037                         FACILITY:  [facility]   PHYSICIAN:  Lezlie Octave, N.P.     DATE OF BIRTH:  [birth year]   DATE OF ADMISSION:  07/15/2004  DATE OF DISCHARGE:  07/16/2004                                 DISCHARGE SUMMARY   HISTORY OF PRESENT ILLNESS:  [name] is a 70 year old, white male  patient with a history of hyperlipidemia, ongoing tobacco use, COPD, and  chronic bronchitis.  He went to the emergency room because of on and off  chest pain for three days.  He was seen by Dr. Lenise Herald in the  emergency room.  It was decided that he should be admitted and ruled out for  an MI.  If his CK-MBs were negative, he would undergo outpatient cardiac  Cardiolite test.  If they were positive, he would undergo a cardiac  catheterization.  His pain was not typical for coronary artery disease.  His  CK-MBs and troponins all came back negative, and he was set for outpatient  Cardiolite test.   LABORATORY DATA:  Hemoglobin 14.6, hematocrit 42, WBCs 9, platelets 234.  Sodium 139, potassium 4.2, BUN 12, creatinine 1.2.  Chest x-ray showed  chronic-appearing pulmonary changes with no evidence of acute abnormality.   DISCHARGE MEDICATIONS:  1.  Coated aspirin once a day.  2.  Combivent inhaler two puffs four times per day.  3.  Tricor 145 mg once a day.  4.  Imdur 30 mg once a day.  5.  Advair Discus as taken at home.  6.  Nitroglycerin if needed.  7.  Protonix 40 mg once a day x1 month.   SPECIAL INSTRUCTIONS:  1.  Persantine-Cardiolite shall be done on Thursday at 1 p.m., July 19, 2004, in our office.  2.  He should stop his Prevacid while he is on the Protonix.  3.  He is to see Dr. Domingo Sep in followup on July 23, 2004, at 1:45 p.m. in      the [place] office.   DISCHARGE DIAGNOSES:  1.  Chest pain, somewhat atypical, negative  cardiac enzymes and troponin.  2.  History of tobacco use.  3.  Chronic obstructive pulmonary disease with chronic bronchitis.  4.  History of hyperlipidemia.  5.  Gastroesophageal reflux disease.       BB/MEDQ  D:  10/03/2004  T:  10/03/2004  Job:  [id number]   cc:   Prudy Feeler, M.D.

## 2010-08-31 NOTE — H&P (Signed)
[name] [name], WENKER NO.:  [id number]   MEDICAL RECORD NO.:  [id number]                   PATIENT TYPE:  INP   LOCATION:  5733                                 FACILITY:  [facility]   PHYSICIAN:  Danice Goltz, M.D. [facility]            DATE OF BIRTH:  01/12/1941   DATE OF ADMISSION:  07/07/2002  DATE OF DISCHARGE:                                HISTORY & PHYSICAL   HISTORY OF PRESENT ILLNESS:  This is a 70 year old white male, a second  admission for this patient with a chief complaint of hemoptysis.  The  patient has had a similar admission in May of 2002.  The patient apparently  had been in his usual state of relatively good health, until approximately  2200 hours on the night of admission, when he noticed that he was coughing  up bright red blood.  He states that he felt the blood pool in his throat,  and then coughed it up.  He has had only streaky hemoptysis since arriving  at the ED, but did have another episode of approximately a quarter of a cup  of blood documented by the ED physician.   The patient underwent evaluation in the ED, showing him to be mildly  hypoxemic with saturations of 88%; he also was noted to have significant  wheezing.  Chest x-ray revealed no active infiltrate, however, CT scan of  the chest showed no PE, however, the patient did have peribronchial  infiltrates and questionable area of alveolar filling which could be early  pneumonitis versus alveolitis.   The patient denies any other symptomatology.  As noted above, the patient  has had a prior admission for the same finding in May of 2002 and was  admitted by Dr. Onalee Hua B. Simonds.  He had evaluation of the airways at that  time which showed only copious blood and no definite endobronchial lesion.  The patient did require ICU-level admission at that time.   The patient denies any chest pain.  He has, however, had some  gastroesophageal reflux symptoms.  He denies any  hematemesis.  He also had  no epistaxis.  Denies any recent nasal congestion.  Also, the patient has  had no fevers, chills or sweats.   PAST MEDICAL HISTORY:  Past medical history is remarkable for  hyperlipidemia.  The patient also has had a prior history of hemoptysis.  He  does have a history of asthmatic bronchitis and chronic bronchitis.   CURRENT MEDICATIONS:  1. Tricor 160 mg daily.  2. Baby aspirin one daily.  3. Centrum vitamins -- one daily.  4. The patient uses Combivent two inhalations twice a day.   SOCIAL HISTORY:  The patient is a Haematologist, he works with [employer].  He  denies alcohol abuse.  He, however, smokes 1-1/2 to 2 packs of cigarettes  per day and has done so of longstanding and has  an over 50-pack-year history  of smoking. 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   REVIEW OF SYSTEMS:  Review of systems is positive for injury to the right  hand which occurred at work prior to arriving at the emergency room; this  hand now is swollen and painful; this apparently occurred while the patient  was working and struck the hand with a steel pipe.   FAMILY HISTORY:  Noncontributory.  In particular, there is no bleeding  source and no history of chronic lung diseases in the family.   PHYSICAL EXAMINATION:  GENERAL:  Physical examination reveals an alert, well-  developed, well-nourished white male who appears somewhat younger than his  70 years of age.  VITAL SIGNS:  Blood pressure is 161/80, pulse is 73, respiratory rate of 20,  initial oxygen saturation on room air is 88% and a temperature of 97.2.  HEENT:  Examination is remarkable for the patient being edentulous.  Pharynx  is clear.  NECK:  Neck is supple.  No adenopathy noted.  No JVD.  LUNGS:  Lungs are with end-expiratory wheezes throughout.  There are faint  crackles of upper lobes.  CARDIAC:  Regular rate and rhythm.  Normal upstrokes with Doppler are heard.  ABDOMEN:   Benign.  EXTREMITIES:  No cyanosis, clubbing or edema noted.  NEUROLOGIC:  Examination is grossly nonfocal.   LABORATORY DATA:  CT scan revealed no PE.  The patient did have  peribronchial infiltrates.   Chest x-ray was clear, as noted previously.   White count was 9.9, H&H were 15.1 and 43.2, platelet count was 251,000;  differential was benign.  PT was 12.0 with an INR of 0.8.  Chemistries were  within normal limits.  Arterial blood gas revealed a PACO2 of 56.7, pH was  normal.   IMPRESSION:  1. Hemoptysis of undetermined etiology, likely secondary to pneumonia versus     hemorrhagic bronchitis versus early alveolitis.  The patient has had     negative bronchoscopy in the past.  2. Chronic obstructive pulmonary disease with asthmatic bronchitic component     and exacerbation likely secondary to tracheobronchitis versus early     pneumonitis.  3. Tobacco abuse and dependence.  4. Injury to the right hand, rule out fracture; this occurred as a work-     related injury.   PLAN:  1. Plan will be to admit the patient for IV antibiotics and IV steroids.  2. We will obtain serologies with regards to his vasculitis.  3. We will also offer the patient antitussives.  4. We will also evaluate the patient's right hand injury.  5. Also obtain arterial blood gas on room air.  6. The patient will likely need reexamination of his airway.  7. Further plans pending prognosis from the above.                                               Danice Goltz, M.D. [facility]    LG/MEDQ  D:  07/07/2002  T:  07/07/2002  Job:  [id number]

## 2010-08-31 NOTE — H&P (Signed)
NAMEMarland Kitchen  [name] [name], [name] [name] NO.:  [id number]   MEDICAL RECORD NO.:  [id number]          PATIENT TYPE:  EMS   LOCATION:  MAJO                         FACILITY:  [facility]   PHYSICIAN:  Willa Rough, M.D.     DATE OF BIRTH:  1941/03/19   DATE OF ADMISSION:  04/17/2005  DATE OF DISCHARGE:                                HISTORY & PHYSICAL   PRIMARY CARDIOLOGIST:  Willa Rough, M.D. (new)   REASON FOR ADMISSION:  [name] [name] is a 70 year old male, with history of  nonobstructive coronary artery disease by prior cardiac catheterization  September 2005 by Dr. Susa Griffins, who now presents to the emergency  room with complaint of left upper extremity and left upper chest pain.   The patient was hospitalized here at [facility] in March 2006.  He  presented with intermittent chest pain, reported relieved with  nitroglycerin, and ruled out for myocardial infarction.  He was referred for  followup outpatient Persantine Cardiolite which he reports today as being  reportedly negative.  Of note, he has since been discharged from the  [facility] practice and is now referred to [place] for  admission.   The patient presents with a history of longstanding intermittent left upper  chest and arm pain.  However, he seems to feel that this was somewhat worse  these past 2 days and, given his concern, he presented himself to the  emergency room.  Specifically, however, he states that these symptoms ARE  NOT precipitated by exertion; i.e., walking.  They seem to occur with use of  his left arm.  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  He also reports that this  can happen when walking his dog and using the left arm to hold the leash.  He also had an episode today while driving his girlfriend to the doctor's  office.  In all of these cases, his symptoms resolve when changing from  using his left  arm to his right hand.  However, he also uses oxycodone and  reports that the symptoms resolve faster with the pain medicine than with  rest.   Of note, the patient does not use nitroglycerin, reportedly being fearful of  this.  He does not, however, report any history of allergic reaction to this  or associated significant drop in blood pressure.   On presenting to the emergency room, the patient states that his symptoms  had already resolved.  He has not been treated with any nitroglycerin.  His  initial electrocardiogram shows normal sinus rhythm with no ischemic  changes.  Initial cardiac markers are all within normal limits.   ALLERGIES:  No known drug allergies.   MEDICATIONS PRIOR TO ADMISSION:  1.  Aspirin 81 daily.  2.  Advair twice daily.  3.  Nexium.  4.  Combivent MDI p.r.n.  5.  Oxycodone/APAP p.r.n.  6.  Lipitor 40 daily.  7.  TriCor 145 daily.   PAST MEDICAL HISTORY:  1.  Nonobstructive coronary artery disease.  1.  Status post cardiac catheterization September 2005: 40-50% mid left          anterior descending artery disease (question systolic bridging);          less than 40% circumflex; multiple 30% RCA lesion.      2.  EF 60%; no mitral regurgitation.  2.  COPD.  3.  Ongoing tobacco.  4.  Gastroesophageal reflux disease.  5.  Hyperlipidemia.   PAST SURGICAL HISTORY:  The patient denies any surgeries.   SOCIAL HISTORY:  The patient lives in [place] with his girlfriend.  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  He stands on his legs for 12  hours at a time.  He has been smoking anywhere from 1 to 2 packs a day for  the past 45 years.  He denies alcohol or illicit drug use.   FAMILY HISTORY:  Mother deceased at age 2, complications of kidney failure.  Father deceased age 79, undetermined causes.  Siblings have no known  coronary artery disease.   REVIEW OF SYSTEMS:  The patient denies any remote or recent history of  exertional  chest discomfort.  He denies paroxysmal nocturnal dyspnea,  orthopnea, or lower extremity edema.  He has intermittent discomfort of both  legs with prolonged standing at work for which he takes oxycodone.  He has  occasional heartburn and is on Nexium, but also takes Tums as needed.  Remainder of Review of Systems is negative.   PHYSICAL EXAMINATION:  VITAL SIGNS:  Blood pressure 109/70, pulse 75 and  regular, respirations 20, temperature 97, O2 saturation 96% on room air.  GENERAL: A 70 year old male in no apparent distress.  HEENT:  Normocephalic and atraumatic.  NECK:  Equal carotid pulses without bruits.  No infraclavicular or  supraclavicular bruits.  LUNGS:  Diminished breath sounds throughout with faint wheezes anteriorly,  no crackles.  HEART:  Regular rate and rhythm (diminished heart sounds) with no  appreciable murmur.  ABDOMEN: Soft, nontender with intact bowel sounds.  EXTREMITIES:  Preserved bilateral femoral and distal pulses without edema.  Preserved bilateral brachial and radial pulses.  NEUROLOGIC:  No focal deficits.   Admission electrocardiogram: Normal sinus rhythm at 70 beats per minute with  normal axis; no ischemic changes.   LABORATORY DATA:  Cardiac enzymes (point-of-care): CPK-MB 1.1, less than 1;  troponin I less than 0.05 (x2).  Hemoglobin 15.7, hematocrit 46, WBC 9,  platelets 251.  Sodium 141, potassium 4.3, BUN 9, creatinine 1, glucose 95.  INR 0.9.   IMPRESSION:  [name] [name] is a 70 year old male, with history of nonobstructive  coronary artery disease and normal left ventricular function by prior  cardiac catheterization in September 2005, with a subsequent reportedly  negative pharmacologic stress Cardiolite in April 2006, with multiple  cardiac risk factors including ongoing tobacco smoking, who presents to the  emergency room with atypical left upper extremity and chest pain, which was not precipitated by exertion such as walking, but is  associated with the use  of the left arm.  He states that this is a chronic condition, and he  typically takes oxycodone for relief.  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   On presentation to the emergency room, the patient reported no further left  upper chest or  arm discomfort.  Admission electrocardiogram is normal, and  initial cardiac markers are normal as well.   PLAN:  The patient will be admitted for 24-hour observation and rule out of  myocardial infarction.  If serial cardiac markers are negative, then  recommendation is for patient to be discharged the following morning with  instructions to follow up with his primary care giver in [place].  If,  however, cardiac markers are abnormal, the recommendation is to proceed with  cardiac catheterization.      Gene Serpe, P.A. [facility]    ______________________________  Willa Rough, M.D.    GS/MEDQ  D:  04/17/2005  T:  04/17/2005  Job:  [id number]   cc:   Prudy Feeler, M.D.  [employer]

## 2010-08-31 NOTE — Op Note (Signed)
   [name], [name] [name]                            ACCOUNT NO.:  [id number]   MEDICAL RECORD NO.:  [id number]                   PATIENT TYPE:  INP   LOCATION:  5733                                 FACILITY:  [facility]   PHYSICIAN:  Oley Balm. Sung Amabile, M.D. [facility]          DATE OF BIRTH:  Aug 03, 1940   DATE OF PROCEDURE:  07/08/2002  DATE OF DISCHARGE:  07/11/2002                                 OPERATIVE REPORT   PROCEDURE:  Bronchoscopy.   INDICATIONS FOR PROCEDURE:  Hemoptysis.   PREMEDICATION:  1. Fentanyl 100 microgram IV.  2. Versed 4 mg IV.   ANESTHESIA:  Topical anesthesia was applied to the nose and throat and 40 to  50 cc of 1% Lidocaine were used during the course of the procedure.   DESCRIPTION OF PROCEDURE:  After adequate sedation and anesthesia the  bronchoscope was introduced via the right nares and advanced into the  posterior pharynx. This demonstrated normal upper airway anatomy. Vocal  cords moved symmetrically. Further anesthesia was achieved with 1% Lidocaine  and the scope was advanced into the trachea.   Complete airway anesthesia was achieved with 1% Lidocaine and an airway  examination was performed. This demonstrated normal segmental airway  anatomy. There were diffuse changes of chronic bronchitis. There was also  old blood pooling in dependent lung zones bilaterally. There was no evidence  of endobronchial tumors, masses or foreign bodies. There was no evidence of  active bleeding.   Throughout the course of the procedure airways were washed with normal  saline. The washings were collected in a specimen trap and a specimen was  sent for cytology. The patient tolerated the procedure well without  complications.                                               Oley Balm Sung Amabile, M.D. [facility]    DBS/MEDQ  D:  07/30/2002  T:  07/31/2002  Job:  [id number]

## 2010-08-31 NOTE — Discharge Summary (Signed)
[name] [name], [name] [name] NO.:  [id number]   MEDICAL RECORD NO.:  [id number]                   PATIENT TYPE:  OBV   LOCATION:  6524                                 FACILITY:  [facility]   PHYSICIAN:  Nanetta Batty, M.D.                DATE OF BIRTH:  August 05, 1940   DATE OF ADMISSION:  12/28/2003  DATE OF DISCHARGE:  12/30/2003                                 DISCHARGE SUMMARY   [name] is a 70 year old white male patient of Dr. Michell Heinrich  who comes to the hospital because of chest pain, he was seen by Dr. Effie Shy  who was on call, he was admitted to rule out MI and further cardiac  evaluation.  He was put on IV heparin, his cardiac markers were negative x3,  he underwent cardiac catheterization on December 29, 2003, he had some left  main irregulars 30-40% proximal circumflex, a dominant RCA with 30% proximal  stenosis, and LAD had 40-50% stenosis in the mid.  It was decided that his  chest pain was probably not cardiac in origin and then he was placed on  increased PPI medicine, with recommendations to be seen by GI as an  outpatient.  On the morning of December 30, 2003 his blood pressure was  100/52, his heart rate is 64, his O2 sats were 91%.  He was also recommended  to quit smoking.  He is already on two inhalers.  His total cholesterol was  133, triglycerides 73, HDL 47 and LDL was 71.   DISCHARGE MEDICATIONS:  1.  Aspirin 81 mg 1 tablet per day.  2.  Tricor 145 mg 1 tablet a day.  3.  Lipitor 20 mg 1 tablet a day.  4.  Toprol XL 12.5 mg 1 tablet per day.  5.  Advair and Combivent MDI.  6.  Prevacid 30 mg twice per day.   ACTIVITY:  As tolerated.   FOLLOW UP:  He should follow up with his primary care doctor, Dr. Prudy Feeler and he will follow up with Dr. Laurena Slimmer on January 19, 2004 at 2:30.  He was recommended to see a GI doctor, he has not seen one in the past, as  an outpatient, he prefers to see Dr. Laurena Slimmer first.   DISCHARGE DIAGNOSES:  1.  Chest pain not cardiac ischemia in etiology.  Status post cardiac      catheterization without any high grade stenosis.  2.  Coronary artery disease.  3.  Hyperlipidemia controlled on current medications of Tricor and Lipitor.  4.  Chronic obstructive pulmonary disease.  5.  Indigestion, gastroesophageal reflux disease.       BB/MEDQ  D:  12/30/2003  T:  01/01/2004  Job:  [id number]   cc:   Dani Gobble, MD  Fax: [phone]   Prudy Feeler, MD   Oley Balm Sung Amabile, M.D. [facility]

## 2010-08-31 NOTE — Discharge Summary (Signed)
[name] [name], [name] [name]                            ACCOUNT NO.:  [id number]   MEDICAL RECORD NO.:  [id number]                   PATIENT TYPE:  INP   LOCATION:  5733                                 FACILITY:  [facility]   PHYSICIAN:  Oley Balm. Sung Amabile, M.D. [facility]          DATE OF BIRTH:  [birth year]   DATE OF ADMISSION:  07/06/2002  DATE OF DISCHARGE:  07/11/2002                                 DISCHARGE SUMMARY   ADMISSION DIAGNOSIS:  Hemoptysis of unclear etiology.   DISCHARGE DIAGNOSIS:  Hemoptysis secondary to acute exacerbation of chronic  asthmatic bronchitis.   HISTORY OF PRESENT ILLNESS:  Please refer to the admission History and  Physical for this patient's initial presentation.  Briefly, he is a 70-year-  old smoker admitted by Dr. Jayme Cloud for hemoptysis.  He had a similar  episode two years ago.  At that time, I had taken care of him and,  therefore, I assumed his care on the day following admission.  His chest x-  ray was nonspecific and demonstrated diffuse increased markings and changes  of COPD on admission.   HOSPITAL COURSE:  He was treated as a COPD exacerbation with  steroids,antibiotics, nebulized bronchodilators.  A CT scan of the chest  demonstrated patchy bilateral, undefined, ground glass opacities.  ANCA was  negative.  Sed rate was normal. ANA was normal.  Anti-glomerular basement  membrane antibody was normal.  He underwent bronchoscopy, and this  demonstrated changes of chronic bronchitis with no tumors or masses.  There  was noted to be old blood emanating from dependent lung cells bilaterally.  Blood clots were suctioned out effectively.  The patient did have further  hemoptysis after the bronchoscopy including on the day prior to discharge.  However at this point, all the blood he is coughing up appears to be very  old blood with no fresh hemoptysis.  His respiratory status has been stable,  and room air saturations are 92 and 93%.  Therefore, he is discharged  in  much improved condition with the above diagnoses.  During the  hospitalization, I did counsel him extensively regarding the need for  smoking cessation.  He seems committed to remaining abstinent at this time.   DISCHARGE MEDICATIONS:  1. TriCor 160 mg p.o. daily.  2. Advair 250/50 one inhalation q.12h.  3. Combivent q.4-6h. p.r.n.  4. Avelox 400 mg p.o. daily for 3 days.  5. Prednisone 60 mg a day x 3, 40 mg a day x 3, 20 mg a day x 3, stop.  6. Percocet 1 to 2 p.o. q.4-6h. p.r.n. pain.  7. Wellbutrin XL 300 mg p.o. q.a.m. for smoking cessation.   FOLLOW UP:  He is instructed to call my office on the day following this  discharge to arrange an appointment with me in two to four weeks.  At that  time, a chest x-ray will be obtained.  He will also need  a repeat CT scan to  ensure that the changes seen previously have resolved.                                               Oley Balm Sung Amabile, M.D. [facility]    DBS/MEDQ  D:  07/11/2002  T:  07/12/2002  Job:  [id number]

## 2010-08-31 NOTE — H&P (Signed)
[place]. [facility]  Patient:    [name] [name], [name] [name]                         MRN: [id number] Adm. Date:  [id number] Attending:  Merwyn Katos                         History and Physical  DATE OF BIRTH: [birth year]  ADMISSION DIAGNOSIS: Hemoptysis.  HISTORY OF PRESENT ILLNESS: Mr. Pheasant is a 70 year old gentleman with an extensive smoking history, but no prior pulmonary history, who presented to the emergency department the evening of Aug 18, 2000 with hemoptysis.  He had difficulty quantifying the exact amount.  Apparently he had been coughing up blood over the course of the day; however, he had been in the emergency room for several hours without any recurrence and was discharged home with arrangements to follow up with [facility] pulmonology.  Unfortunately, he had recurrent hemoptysis and returned to the emergency department, at which time I was called to see him.  An arterial blood gas was performed in the emergency department which also demonstrated hypoxemia with a pO2 of 46 on room air. The patient denies fever and chest pain.  He has had no lower extremity edema or calf tenderness.  No nausea, vomiting, or diarrhea.  He thinks that maybe there was an episode of epistaxis but is pretty certain that most of the blood that he has coughed up has come from his lungs.  PAST MEDICAL HISTORY:  1. Hyperlipidemia.  2. "Nerves."  3. Dyspepsia.  CURRENT MEDICATIONS:  1. Tricor 160 mg q.d.  2. Paxil 30 mg q.d.  3. Prevacid 15 mg b.i.d.  4. Enteric-coated aspirin.  SOCIAL HISTORY: He smokes up to two packs of cigarettes per day.  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  FAMILY HISTORY: This has been reviewed and it notable only for emphysema in two brothers.  REVIEW OF SYSTEMS: As per the History of Present Illness.  PHYSICAL EXAMINATION:  VITAL SIGNS: Afebrile.  Normal vital signs.  GENERAL: He is in  no acute cardiac or respiratory distress.  HEENT: No acute abnormalities.  Nasal passages are patent without evidence of blood accumulated in the nares.  Oropharynx reveals no erythema or exudate. The tongue is slightly discolored from recent hemoptysis.  NECK: Supple, without adenopathy or jugular venous distention.  CHEST: Moderately diminished breath sounds, with very faint crackles in both bases; otherwise, no adventitious sounds.  CARDIAC: Regular rate and rhythm with no murmurs.  ABDOMEN: Soft, nontender, normal bowel sounds.  EXTREMITIES: Without clubbing, cyanosis, or edema.  NEUROLOGIC: Grossly intact.  LABORATORY DATA: Chest x-ray reveals an extremely vague left lower lobe infiltrate, which probably represents blood pooling in the left lower lobe.  IMPRESSION/PLAN: Hemoptysis in a heavy smoker - this most likely represents bronchitis.  However, he has significant compromise in gas exchange and has had recurrent episodes throughout the course of the day.  Therefore he needs hospitalization for treatment of bronchitis and for airway examination.  I have counseled him extensively regarding smoking cessation, and he seems to be fairly committed to such given the scare experienced earlier today. DD:  08/19/00 TD:  08/19/00 Job: [id number]

## 2010-08-31 NOTE — Op Note (Signed)
[place]. [facility]  Patient:    [name] [name], RUNKLES                         MRN: [id number] Proc. Date: 08/19/00 Adm. Date:  [id number] Disc. Date: [id number] Attending:  Doug Sou CC:         Oley Balm. Sung Amabile, M.D. [facility]   Operative Report  PROCEDURE PERFORMED:  Bronchoscopy.  OPERATOR:  Charlcie Cradle. Delford Field, M.D. [facility]  CHIEF COMPLAINT:  Hemoptysis.  ANESTHESIA:  1% Xylocaine local.  PREOP MEDICATIONS:  Demerol 50 mg IV, Phenergan 12.5 mg IV, Versed 3 mg IV push.  DESCRIPTION OF PROCEDURE:  The Olympus video bronchoscope was introduced through the oropharynx.  The nares were too small to allow for passage of the scope.  This procedure was very difficult.  The patient was very agitated, desaturating to the low 80% range despite 100% nonrebreather mask given to the patient.  The patient had diffuse airway bleeding seen in the lower airway. No specific endobronchial lesion was seen.  There was blood seen oozing from all orifices including specifically the right upper lobe apical segment, right middle lobe, right lower lobe, superior segment, left upper lobe apical posterior segment, left lower lobe superior segment.  No endobronchial lesions were identified.  We were only able to wash the lungs out and give topical epinephrine.  Bronchial washings were obtained for microbiologic and cytologic data.  No biopsies were obtained.  The case was very difficult and was suspended prematurely due to significant desaturation and agitation on the patients behalf.  COMPLICATIONS:  Significant agitation and desaturation resulting in early termination of the procedure.  IMPRESSION:  Diffuse airway bleeding of unknown cause.  No real evidence of active purulence of tracheobronchitis.  No evidence of endobronchial lesion.  RECOMMENDATIONS:  Because of significant desaturation and fear of alveolar hemorrhage syndrome, will move patient to intensive care on a stat  basis. Support with oxygen administration.  May yet require mechanical ventilation. Recheck coagulation studies.  Check antiglomerulo ____________ membrane serology and follow up with this patient. DD:  08/19/00 TD:  08/19/00 Job: [id number]

## 2010-08-31 NOTE — Discharge Summary (Signed)
[place]. [facility]  Patient:    [name] [name], [name] [name]                         MRN: [id number] Adm. Date:  [id number] Disc. Date: [id number] Attending:  Merwyn Katos                           Discharge Summary  DATE OF BIRTH:  05-25-1940  ADMISSION DIAGNOSES: 1. Hemoptysis. 2. Smoker. 3. Hypoxemia.  DISCHARGE DIAGNOSES: 1. Hemoptysis, exacerbation of bronchitis versus alveolar hemorrhage syndrome. 2. Smoker. 3. Hypoxemia.  HISTORY OF PRESENT ILLNESS:  Please refer to Admission History and Physical for this patients initial presentation.  He is a 70 year old gentleman with no prior pulmonary history who presented to the emergency department on the evening of May 6 with scant hemoptysis.  He was sent home with instructions to follow up with [facility] Pulmonology; however, he had recurrent hemoptysis which, per his description, was large volume and presented to the emergency department again on the morning of May 7.  He was also found to be hypoxemic with a pO2 of 46 on room air.  Therefore, he was admitted for treatment of a presumed exacerbation of chronic bronchitis and also so that a bronchoscopic airway examination could be performed.  HOSPITAL COURSE:  His initial treatment included antibiotics, nebulized bronchodilators, oxygen, and intravenous corticosteroids.  He underwent bronchoscopy performed by Dr. Delford Field on the day of Aug 19, 2000, and there were no endobronchial lesions seen.  Dr. Delford Field did find bleeding from multiple segments and raised the concern for alveolar hemorrhage.  There was significant enough bleeding that the patient required transfer to the ICU postbronchoscopy for stabilization.  A CT scan of the chest was performed which simply demonstrated patchy infiltrates in both lungs, more on the left than the right.  The patients condition stabilized nicely, and on the morning of Aug 20, 2000, he was transferred to a regular floor.  The  rest of his hospitalization was uncomplicated, and he had no further hemoptysis.  He was discharged home on Aug 22, 2000, in markedly improved condition with a room air oxygen saturation of 94%.  It is noted that an antiglomerular basement membrane antibody is pending at the time of this discharge.  DISCHARGE MEDICATIONS: 1. Tricor 160 mg p.o. q.d. 2. Paxil 30 mg p.o. q.d. 3. Prevacid 15 mg p.o. b.i.d. 4. Enteric-coated aspirin - hold until seen in the office in two to three    weeks. 5. Ceftin 250 mg p.o. q.12h. 6. Prednisone 20 mg - three p.o. q.d. x 3, two q.d. x 3, one q.d. x 3, stop.  DISCHARGE INSTRUCTIONS:  He is to call Dr. Sung Amabile to arrange for a follow-up in two to three weeks.  We have discussed the smoking issue, and he has been counseled extensively regarding the need of smoking cessation.  He seemed quite committed to quitting. DD:  08/22/00 TD:  08/25/00 Job: [id number]

## 2010-08-31 NOTE — Discharge Summary (Signed)
[name] [name], [name] [name] NO.:  [id number]   MEDICAL RECORD NO.:  [id number]                   PATIENT TYPE:  INP   LOCATION:  A224                                 FACILITY:  [facility]   PHYSICIAN:  Vania Rea, M.D.              DATE OF BIRTH:  July 14, 1940   DATE OF ADMISSION:  06/01/2003  DATE OF DISCHARGE:  06/02/2003                                 DISCHARGE SUMMARY   PRIMARY CARE PHYSICIAN:  Dalene Seltzer, D.O.   DISCHARGE DIAGNOSES:  1. Acute exacerbation of chronic obstructive pulmonary disease.  2. History of hyperlipidemia.  3. Chronic obstructive pulmonary disease.  4. History of recurrent hemoptysis.  5. Tobacco abuse.   DISPOSITION:  Discharged home.   DISCHARGE CONDITION:  Stable.   DISCHARGE MEDICATIONS:  1. Tricor 180 mg daily.  2. Prevacid 30 mg daily.  3. Aspirin 81 mg daily.  4. Combivent inhaler 2 puffs twice daily.  5. Advair 1 puff twice daily.  6. Zithromax 500 mg daily for 1 week.  7. Prednisone tapering dose, starting at 50 mg reduce by 10 mg daily.  8. Nicotine patch 21 mg daily.   HOSPITAL COURSE:  Please refer to history and physical earlier today.  This  is a 70 year old Caucasian man with a 75-pack-year tobacco history and  hyperlipidemia.  He has been investigated previously for recurrent episodes  of hemoptysis, but who is in a physically demanding job. He says that he can  walk 1-4 miles quite easily, goes up and down 2 flights of stairs at work  every day.  Goes up and down stairs at home without difficulty and without  chest pain.  While driving he had a sudden flash of chest pain across his  chest lasting 1 second.  He got panicky and decided to come to the emergency  room.  In the emergency room he was evaluated for chest pain and started on  the chest pain rule out protocol.  His admission was called in.   Examination of the patient revealed that he had prolonged expirations  and  diffuse  wheezing.  The patient was comfortable and having no pains. He gave  no history of nausea, diaphoresis, sweating, or radiation of the pain.  Apart from the 1-second flash of pain there has been no recurrence.   The patient's 3 sets of cardiac enzymes were all completely negative. His  serial EKGs showed no evidence of ischemia.  The patient was treated for a  mild acute exacerbation of COPD.  He was anxious to go home and was  discharged within 24 hours with advice to follow up with his primary care  physician and to make an appointment to see Dr. Domingo Sep of the [facility] to follow up on his cardiac status.     ___________________________________________  Vania Rea, M.D.   LC/MEDQ  D:  06/06/2003  T:  06/06/2003  Job:  [id number]

## 2010-08-31 NOTE — H&P (Signed)
NAMEMarland Kitchen  [name] [name], [name] [name] NO.:  [id number]   MEDICAL RECORD NO.:  [id number]                   PATIENT TYPE:  INP   LOCATION:  ED99                                 FACILITY:  [facility]   PHYSICIAN:  Vania Rea, M.D.              DATE OF BIRTH:  15-May-1940   DATE OF ADMISSION:  06/02/2003  DATE OF DISCHARGE:                                HISTORY & PHYSICAL   PRIMARY CARE PHYSICIAN:  Aniceto Boss, P.A./Barry Dewaine Conger, D.O.   CHIEF COMPLAINT:  Sudden flash of chest pain.   HISTORY OF PRESENT ILLNESS:  This is a 70 year old Caucasian man with a 75  pack year tobacco history, COPD and hyperlipidemia, who is in a physically  demanding job, is up and down stairs daily without difficulty, who had a  sudden flash of pain across his chest lasting one second.  Occurred while he  was driving, caused him to become scared and he came to the emergency room  for evaluation.  There was no associated dizziness, sweating, palpitations  or nausea.  He denies orthopnea, PND, or leg edema.   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  He denies any fever but has a chronic cough from smoking.   He has a past history of being investigated for hemoptysis in 2002 and 2004.  Evaluation was negative for anything specific and it was diagnosed as  related to acute exacerbation of COPD.  He has had no chest pain since  admission.  He denies nausea, vomiting, diarrhea or constipation or weight  changes. He denies any urinary symptoms.  He has occasional headaches and  occasional attacks of sinusitis. The patient uses Viagra occasionally but is  unwilling to state his last use since his girlfriend is in the room.   PAST MEDICAL HISTORY:  1. Hyperlipidemia.  2. Emphysema diagnosed five years ago.  3. Recurrent episodes of hemoptysis 2002, 2004.  4. Tobacco abuse.   MEDICATIONS:  1. Aspirin 81 mg daily.  2. Prevacid 30 mg daily.  3.  Tricor 180 mg daily.  4. Centrum vitamins daily.  5. Albuterol inhaler p.r.n. about once or twice per week.  6. Viagra p.r.n.   ALLERGIES:  No known drug allergies.   SOCIAL HISTORY:  Tobacco 1 1/2 to 2 packs per day for the past 50 years.  Denies alcohol or drug use.  Lives with his girlfriend, has never been  married, has five children ages 24 to 5 who are  healthy and never had  heart disease, hypertension or diabetes.  He has four siblings alive and  three siblings who had died. The four siblings alive as far as he knows are  healthy, no high blood pressure, diabetes or heart disease.  Three siblings  all died from emphysema.  His mother died at the age of 74 from kidney  failure  and he denies hypertension, diabetes or heart disease in this  parent.  His father was physically very active up to age 16, died at age 88  from old age.  No history of hypertension, diabetes or heart disease.   REVIEW OF SYMPTOMS:  Otherwise noncontributory.   PHYSICAL EXAMINATION:  GENERAL:  A middle-aged Caucasian man sitting up in  the stretcher in no acute distress.  He is able to lie completely flat  without any dyspnea.  VITAL SIGNS:  When first seen in the emergency room, temperature 97.2, blood  pressure 118/72, pulse 73, respirations 22.  Currently having received some  metoprolol, his blood pressure is 99/66, his pulse is 66, his respirations  is 18, he is saturating at 92% on 2 liters, 95% on room air.  HEENT:  Pink and anicteric.  Pupils are equal and reactive to light. There  is no lymphadenopathy, no JVD.  CHEST:  He has a barrel chest, prolonged expiration, diffuse wheezing, mild  bronchospasm, no crackles.  CARDIOVASCULAR:  He has distant heart sounds.  No rubs, gallops or murmurs.  ABDOMEN:  Soft, obese, nontender, no organomegaly.  EXTREMITIES:  No edema, 3+ pulses.  CNS:  He is alert and oriented x3. No focal deficit.   LABORATORY DATA:  Hemoglobin 16.2, hematocrit 46.6, white  count 11.1,  platelets 276.  His LFTs are completely normal. His chem 7, sodium 136,  potassium 4.2, chloride 102, CO2 28, BUN 15, creatinine 0.9, glucose 106,  calcium 9.9.  He had two sets of cardiac enzymes so far. The CK total has  moved from 133 to 96, MB 4.1 to 3.1, and the troponin persists at 0.01.   His EKG, the first one showed normal sinus rhythm with right axis  __________with axis. The second EKG eight hours later after having received  metoprolol, the only new change was a first degree AV block otherwise the  same.   ASSESSMENT:  An elderly man with a history of hyperlipidemia, tobacco abuse,  chronic obstructive pulmonary disease presents with a flash of chest pain  which is not typical for cardiac pain and physical exam reveals evidence of  a mild acute exacerbation of chronic obstructive pulmonary disease.  We will  treat his acute chronic obstructive pulmonary disease exacerbation. We will  complete his rule out myocardial infarction protocol and this gentleman will  be discharged after he is ruled out with outpatient medication for chronic  obstructive pulmonary disease and referral to cardiologist and his primary  care physician.  We will discontinue the metoprolol and nitroglycerin  because of the Viagra and the chronic obstructive pulmonary disease.  We  will continue his other medications.  He will probably go out on Zithromax  and Humibid.     ___________________________________________                                         Vania Rea, M.D.   LC/MEDQ  D:  06/02/2003  T:  06/02/2003  Job:  [id number]

## 2010-08-31 NOTE — H&P (Signed)
NAMEPHILBERT, OCALLAGHAN                            ACCOUNT NO.:  [id number]   MEDICAL RECORD NO.:  [id number]                   PATIENT TYPE:  EMS   LOCATION:  MAJO                                 FACILITY:  [facility]   PHYSICIAN:  Quita Skye. Waldon Reining, MD             DATE OF BIRTH:  June 08, 1940   DATE OF ADMISSION:  12/28/2003  DATE OF DISCHARGE:                                HISTORY & PHYSICAL   [name] [name] is a 70 year old white male who is admitted to [facility] for further evaluation of chest pain.   The patient, who has no past history of documented cardiac disease,  presented to the emergency department with a two-day history of chest pain.  He has experienced intermittent episodes of chest pain over these last two  days. The chest pain is described as a burning in both sides of his anterior  chest. It radiates down both legs. The episodes occur at random and appear  not to be related to position, activity, meals, or respirations. There were  no exacerbating or ameliorating factors. It appears not to be related to  position, activity, meals, or respirations. The episodes last five to ten  minutes each and resolve spontaneously. He finds it is difficult to  quantitate how many episodes he has experienced in the last two days. The  patient is free of chest pain at this time.  There is no dyspnea or nausea  associated with the episodes, though he has noted some diaphoresis with  them.   As noted, the patient has no past history of cardiac disease including no  history of myocardial infarction, coronary artery disease, congestive heart  failure, or arrhythmia. The patient was hospitalized at [facility] for chest  pain approximately six months ago. A follow-up nuclear stress test at Dr.  Roque Lias office, according to the patient, was normal.   The patient has a number of risk factors for coronary artery disease  including dyslipidemia and smoking. He continues to smoke one  and a half  packs of cigarettes per day. There is no history of diabetes mellitus,  hypertension, or family history of early coronary artery disease. The  patient has a history of gastroesophageal reflux and emphysema.   MEDICATIONS:  Doxycycline, aspirin, Lipitor, Advair, and Prevacid.   MEDICATION ALLERGIES:  None.   The patient works in Sales promotion account executive. He lives with his wife. He does  not drink. He smokes one and a half packs of cigarettes per day as described  above.  He does not use any illicit drugs.   FAMILY HISTORY:  His father's medical history is unknown. His mother died of  renal failure. His siblings are alive and well.   REVIEW OF SYSTEMS:  No new problems related to his head, eyes, ears, nose,  mouth, throat, lungs, gastrointestinal system, genitourinary system, or  extremities. There is no history of neurologic or psychiatric disorder.  There is no fever, chills, or weight loss.   PHYSICAL EXAMINATION:  VITAL SIGNS: Blood pressure is 131/87, pulse 70 and  regular, respirations 18, temperature 98.0.  GENERAL: The patient is an elderly white male in no discomfort. He is alert,  oriented, appropriate, and responsive.  HEENT: Normal.  NECK: Without thyromegaly or adenopathy. Carotid pulses palpable bilaterally  without bruits.  CARDIAC: Normal S1 and S2. There is no S3, S4, murmur, rub, or click.  Cardiac rhythm is regular. No chest wall tenderness noted.  LUNGS: Clear.  ABDOMEN: Soft and nontender. There is no mass, hepatosplenomegaly, bruits,  distention, rebound, guarding, or rigidity. Bowel sounds normal.  RECTAL/GENITAL EXAM: Not performed and not pertinent to reason for acute  care hospitalization.  EXTREMITIES: Without edema, deviation, or deformity. Radial and dorsalis  pedis pulses are palpable bilaterally. Brief screening neurologic survey is  unremarkable.   The electrocardiogram is normal. The chest radiograph report is pending at  the time of this  dictation. The initial set of cardiac markers revealed a  myoglobin of 74.4, CK-MB 1.1, and troponin less than 0.05. Potassium was  3.7, BUN 13, creatinine 1.4. The remaining studies are pending at the time  of this dictation.   IMPRESSION:  1.  Chest pain, rule out coronary artery disease.  2.  Dyslipidemia.  3.  Gastroesophageal reflux disease.  4.  Emphysema.   PLAN:  1.  Telemetry.  2.  Serial cardiac enzymes.  3.  Aspirin.  4.  Intravenous heparin.  5.  Intravenous nitroglycerin.  6.  Discontinue smoking.  7.  Further measures per Dr. Domingo Sep.                                                Quita Skye. Waldon Reining, MD    MSC/MEDQ  D:  12/29/2003  T:  12/29/2003  Job:  [id number]   cc:   Dani Gobble, MD  Fax: [phone]

## 2010-08-31 NOTE — Discharge Summary (Signed)
NAMEMarland [name]  MAXIMILIEN, HAYASHI NO.:  [id number]   MEDICAL RECORD NO.:  [id number]          PATIENT TYPE:  OBV   LOCATION:  2037                         FACILITY:  [facility]   PHYSICIAN:  Darlin Priestly, MD  DATE OF BIRTH:  December 16, 1940   DATE OF ADMISSION:  07/15/2004  DATE OF DISCHARGE:  07/16/2004                                 DISCHARGE SUMMARY   Mr. [name] is a 70 year old male patient with COPD, chronic  bronchitis. He apparently was having chest pain on and off for about three  days. He came to the emergency room. He did get relief with sublingual  nitroglycerin. He did have a catheterization in September 2005 revealing  some CAD, LAD had 50% mid, left circumflex had 40%, RCA 30% with a normal  EF. Last Cardiolite was February 2005 without any ischemia and a normal EF.  His initial markers in the ER were negative. It was decided that he should  be admitted to rule out MI. If he had a negative CK-MB then he could be  discharged the following day with an outpatient Cardiolite. Dr. Jenne Campus  wanted to change his Prevacid to Protonix. On July 16, 2004, he was seen by  Dr. Elsie Lincoln. His CK-MB and troponin were negative. His blood pressure was  98/66 to 110/70, heart rate was 60s to 70s, sinus rhythm, and he was  considered to be stable to be discharged home and have an outpatient  Cardiolite.   DISCHARGE MEDICATIONS:  Enteric-coated aspirin once a day, Combivent inhaler  two puffs 4 times a day, Tricor 145 mg once a day, Imdur 30 mg once a day,  Advair Diskus as tolerated, home nitroglycerin as needed, Protonix 40 mg  once a day for a month. He will undergo a Persantine Cardiolite on Thursday,  July 19, 2004, at 1 p.m., in our office and he will  follow up with Dr.  Domingo Sep on July 23, 2004, at 1:45 in the regional office.   DISCHARGE DIAGNOSES:  1.  Chest pain with negative CK-MBs in the past. Only had moderate coronary      artery disease. Thus, he will be worked  up as an outpatient.  2.  Positive gastroesophageal reflux disease. Protonix replaced by Protonix.  3.  Chronic obstructive pulmonary disease.  4.  Hyperlipidemia.       BB/MEDQ  D:  10/05/2004  T:  10/06/2004  Job:  [id number]

## 2010-08-31 NOTE — Discharge Summary (Signed)
NAMEOBINNA, EHRESMAN                ACCOUNT NO.:  [id number]   MEDICAL RECORD NO.:  [id number]          PATIENT TYPE:  INP   LOCATION:  4733                         FACILITY:  [facility]   PHYSICIAN:  Willa Rough, M.D.     DATE OF BIRTH:  26-Nov-1940   DATE OF ADMISSION:  04/17/2005  DATE OF DISCHARGE:  04/18/2005                                 DISCHARGE SUMMARY   PRINCIPAL DIAGNOSIS:  Atypical left arm and chest pain.   OTHER DIAGNOSES:  1.  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  2.  Chronic obstructive pulmonary disease.  3.  Normal left ventricular function with ejection fraction 60%.  4.  Ongoing tobacco abuse.  5.  Gastroesophageal reflux disease.  6.  Hyperlipidemia.   ALLERGIES:  NO KNOWN DRUG ALLERGIES.   PROCEDURES:  None.   HISTORY OF PRESENT ILLNESS:  A 70 year old male with a prior history of  nonobstructive coronary artery disease by cardiac catheterization September  2005 with subsequent negative functional study in March 2006 performed  secondary to intermittent chest pain.  The patient presented to the ED on  April 17, 2005 with a longstanding history of intermittent left upper chest  and arm pain which had been somewhat worse in the two days prior to  admission.  The patient reported that symptoms were specifically associated  with use of the left arm such as holding a leash when walking a dog, or  steering his steering wheel with his left hand, and completely resolved by  either switching hands/arms to the right, or taking oxycodone.  The  patient's EKG in the ER showed no acute abnormalities and he was admitted  for observation.   HOSPITAL COURSE:  Cardiac markers were negative x3, and without use of the  left arm the patient has not had any recurrent symptoms.  He is being  discharged home today and has been advised to follow-up with his primary  care physician in  [place].   DISCHARGE LABS:  Hemoglobin 16.3, hematocrit 48.0, WBC 9.0, platelets 251,  sodium 141, potassium 3.6, chloride 105, CO2 29, BUN 8, creatinine 1.1,  glucose 90, alkaline phosphatase 68, total bilirubin 1.0, AST 26, ALT 24,  total protein 6.5, albumin 4.0, calcium 9.6, cardiac markers negative x3,  TSH 0.655.   DISPOSITION:  The patient is being discharged home today in good condition.   FOLLOWUP PLANS AND APPOINTMENTS:  The patient is advised to follow-up with  his primary care physician Dr. Prudy Feeler in [place], in the next week.  We would also recommend pulmonology followup and smoking cessation for  significant COPD.  We will also schedule him with an appointment to follow-  up with Dr. Willa Rough in the next 1-2 weeks (the patient may be seen by  Dr. Henrietta Hoover __________ PA).   DISCHARGE MEDICATIONS:  1.  Tricor 145 mg daily.  2.  Aspirin 81 mg daily.  3.  Lipitor 40 mg daily.  4.  Oxycodone/APAP 5/500 mg as previously prescribed.  5.  Nexium 40 mg daily.  6.  Advair as previously prescribed.  7.  Combivent as previously prescribed.  8.  Nitroglycerin 0.4 mg sublingual p.r.n. chest pain.   OUTSTANDING LAB STUDIES:  None.   DURATION OF DISCHARGE ENCOUNTER:  20 minutes including physician time.      Ok Anis, NP    ______________________________  Willa Rough, M.D.    CRB/MEDQ  D:  04/18/2005  T:  04/18/2005  Job:  [id number]

## 2010-08-31 NOTE — Op Note (Signed)
NAMEEMONTE, DIEUJUSTE                            ACCOUNT NO.:  [id number]   MEDICAL RECORD NO.:  [id number]                   PATIENT TYPE:  OBV   LOCATION:  0444                                 [facility]ACILITY[facility]  [facility]   PHYSICIAN:  Dionne Ano. Everlene Other, M.D.         DATE OF BIRTH:  October 21, 1940   DATE OF PROCEDURE:  07/22/2002  DATE OF DISCHARGE:  07/23/2002                                 OPERATIVE REPORT   PREOPERATIVE DIAGNOSIS:  Displaced right ring finger metacarpal fracture.   POSTOPERATIVE DIAGNOSIS:  Displaced right ring finger metacarpal fracture.   PROCEDURES:  1. ORIF right ring finger metacarpal fracture with intermedullary fixation.  2. Stress x-rays.   SURGEON:  Dionne Ano. Amanda Pea, M.D.   ASSISTANT:  Karie Chimera, P.A.-C.   COMPLICATIONS:  None.   ANESTHESIA:  Wrist block with IV sedation.   TOURNIQUET TIME:  Zero.   DRAINS:  None.   OPERATIVE INDICATIONS:  This patient is a 70 year old male, who presents  with the above-mentioned diagnosis.  He has a poorly-angulated ring finger  metacarpal fracture.  He has failed closed reduction attempts and presents  for ORIF.  He understands the risks and benefits of surgery, including the  risk of infection, bleeding, anesthesia, damage to normal structures, and  failure of the surgery to accomplish its intended goals of relieving  symptoms and restoring function.  With this in mind, he desires to proceed.  All questions have been encouraged and answered, preoperative radiograph.   OPERATIVE FINDINGS:  The patient had a ring finger metacarpal fracture,  transverse in nature.  This was  significantly displaced and angulated.  He  underwent ORIF with intermedullary fixation without difficulty.  He  tolerated the procedure well, and there were no complications.  Skin  integrity was excellent throughout the case.   OPERATION IN DETAIL:  The patient was seen by myself and anesthesia, given  preoperative antibiotics and a  breathing treatment.  Following this, I  administered the wrist block for anesthetic purposes.  Following this, he  was taken to the operative suite and underwent IV sedation and then  underwent a thorough prep and drape with Betadine scrub and paint, followed  by securing a sterile field with drapes.  Following this, a longitudinal  incision was made at the base of the [facility] joint every fourth CMC region.  Dissection was carried down to the proximal portion of the metacarpal base.  A drill hole was made with drill bit and following this, a 2 mm blunt tip  Kirschner wire, long in nature, was placed.  It was pre-bent.  It was  threaded intermedullary across the fracture site and was tapped and then  seated well nicely distally.  I bent it 90 degrees and then gauged it so  that there would be no pointy or sharp ends against the extensor apparatus.  This was then turned 90 degrees and placed flush against the  bone.  Following this, x-rays were taken, AP, lateral, and oblique for permanent  documentation and hard copies.  This was noted to have excellent position  and splay of the metacarpals.  The metacarpal was nicely reduced.  Fingers  closed excellent, and there were no complications.  The patient then had the  wound copiously irrigated and closed with interrupted Prolene suture.  I did  not enter the fracture site during the course of the dissection, reduction,  and fixation attempts.  The patient had an excellent reduction.  He  introduced __________ perfectly and had a nice finger splay.  Following  closure of the wound, he was splinted in a volar plaster splint.  He was  taken to the recovery room.  He will be elevated  and admitted overnight for  IV antibiotics, elevation, and postop pain management.  We will allow him to  go home tomorrow if he is doing well.  All questions have been encouraged  and answered.                                               Dionne Ano. Everlene Other,  M.D.    Nash Mantis  D:  07/22/2002  T:  07/23/2002  Job:  [id number]

## 2010-11-11 ENCOUNTER — Emergency Department (HOSPITAL_COMMUNITY)
Admission: EM | Admit: 2010-11-11 | Discharge: 2010-11-12 | Disposition: A | Discharge status: Home / Self Care | Payer: Medicare Other | Attending: Emergency Medicine | Admitting: Emergency Medicine

## 2010-11-11 ENCOUNTER — Encounter (HOSPITAL_COMMUNITY): Payer: Self-pay | Admitting: Emergency Medicine

## 2010-11-11 ENCOUNTER — Other Ambulatory Visit: Payer: Self-pay

## 2010-11-11 ENCOUNTER — Emergency Department (HOSPITAL_COMMUNITY): Payer: Medicare Other

## 2010-11-11 DIAGNOSIS — R062 Wheezing: Secondary | ICD-10-CM | POA: Insufficient documentation

## 2010-11-11 DIAGNOSIS — F17200 Nicotine dependence, unspecified, uncomplicated: Secondary | ICD-10-CM | POA: Insufficient documentation

## 2010-11-11 DIAGNOSIS — R0602 Shortness of breath: Secondary | ICD-10-CM | POA: Insufficient documentation

## 2010-11-11 DIAGNOSIS — J441 Chronic obstructive pulmonary disease with (acute) exacerbation: Secondary | ICD-10-CM | POA: Insufficient documentation

## 2010-11-11 DIAGNOSIS — Z79899 Other long term (current) drug therapy: Secondary | ICD-10-CM | POA: Insufficient documentation

## 2010-11-11 HISTORY — DX: Chronic obstructive pulmonary disease, unspecified: J44.9

## 2010-11-11 LAB — BASIC METABOLIC PANEL
BUN: 12 mg/dL (ref 6–23)
CO2: 31 mEq/L (ref 19–32)
Chloride: 93 mEq/L — ABNORMAL LOW (ref 96–112)
Creatinine, Ser: 0.73 mg/dL (ref 0.50–1.35)
Glucose, Bld: 151 mg/dL — ABNORMAL HIGH (ref 70–99)
Potassium: 4.9 mEq/L (ref 3.5–5.1)

## 2010-11-11 LAB — CBC
HCT: 45.9 % (ref 39.0–52.0)
Hemoglobin: 15.5 g/dL (ref 13.0–17.0)
MCHC: 33.8 g/dL (ref 30.0–36.0)
RBC: 4.85 MIL/uL (ref 4.22–5.81)
WBC: 15.3 10*3/uL — ABNORMAL HIGH (ref 4.0–10.5)

## 2010-11-11 LAB — URINALYSIS, ROUTINE W REFLEX MICROSCOPIC
Bilirubin Urine: NEGATIVE
Hgb urine dipstick: NEGATIVE
Specific Gravity, Urine: <1.005 — ABNORMAL LOW (ref 1.005–1.030)
pH: 6.5 (ref 5.0–8.0)

## 2010-11-11 LAB — DIFFERENTIAL
Lymphocytes Relative: 16 % (ref 12–46)
Lymphs Abs: 2.4 10*3/uL (ref 0.7–4.0)
Monocytes Absolute: 1.7 10*3/uL — ABNORMAL HIGH (ref 0.1–1.0)
Monocytes Relative: 11 % (ref 3–12)
Neutro Abs: 11.1 10*3/uL — ABNORMAL HIGH (ref 1.7–7.7)
Neutrophils Relative %: 73 % (ref 43–77)

## 2010-11-11 MED ORDER — ALBUTEROL SULFATE (5 MG/ML) 0.5% IN NEBU: 2.5000 mg | INHALATION_SOLUTION | Freq: Once | RESPIRATORY_TRACT | Status: DC

## 2010-11-11 MED ORDER — METHYLPREDNISOLONE SODIUM SUCC 125 MG IJ SOLR
125.0000 mg | Freq: Once | INTRAMUSCULAR | Status: AC
  Administered 2010-11-11: 125 mg via INTRAVENOUS
  Filled 2010-11-11: qty 2

## 2010-11-11 MED ORDER — ALBUTEROL SULFATE (5 MG/ML) 0.5% IN NEBU
20.0000 mg | INHALATION_SOLUTION | Freq: Once | RESPIRATORY_TRACT | Status: AC
  Administered 2010-11-11: 20 mg via RESPIRATORY_TRACT
  Filled 2010-11-11 (×2): qty 4

## 2010-11-11 NOTE — ED Notes (Signed)
Resting quietly in bed. Continues to deny pain. Resp even and unlabored. Breathing tx in progress.

## 2010-11-11 NOTE — ED Notes (Signed)
Pt states he has had difficulty breathing today.  Pt family member stated he was cyanotic around the lips before she brought him in.  Pt has expiratory wheezes.

## 2010-11-11 NOTE — ED Provider Notes (Signed)
Scribed for Dr. Effie Shy, the patient was seen in room 14. This chart was scribed by Hillery Hunter. This patient's care was started at 20:32.   History     Chief Complaint  Patient presents with  . Shortness of Breath  . Wheezing   Patient is a 70 y.o. male presenting with shortness of breath. The history is provided by the patient.  Shortness of Breath  The current episode started 5 to 7 days ago. The onset was gradual. The problem occurs frequently. Progression since onset: waxing and waning. Associated symptoms include cough, shortness of breath and wheezing. Pertinent negatives include no chest pain and no fever.   Patient reports shortness of breath over the last six days. He was diagnosed with pneumonia at that time and given prednisone taper and avalox and has been compliant with medications but feels the symptoms have not improved. He reports shortness of breath is persistent, non-exertional, waxes and wanes without apparent modifying factors.  At home treatments: Takes Albuterol breathing treatment every four hours (Combivent and Simbacort). On supplemental O2 at home. He reports hx COPD, emphysema, CAD dx five years ago. Continues to smoke despite these issues and being told by his physicians that he must quit.  Past Medical History  Diagnosis Date  . COPD (chronic obstructive pulmonary disease)   . Asthma     Past Surgical History  Procedure Date  . Fracture surgery     No family history on file.  History  Substance Use Topics  . Smoking status: Current Everyday Smoker -- 0.5 packs/day  . Smokeless tobacco: Never Used  . Alcohol Use: No      Review of Systems  Constitutional: Negative for fever.  HENT: Negative for congestion.        Daughter reports his lips turned blue earlier today, now resolved.  Respiratory: Positive for cough, shortness of breath and wheezing.   Cardiovascular: Negative for chest pain, palpitations and leg swelling.    Gastrointestinal: Negative for vomiting and abdominal pain.  Genitourinary: Negative for dysuria and difficulty urinating.  Musculoskeletal: Negative for back pain.  Neurological: Negative for dizziness and light-headedness.  Psychiatric/Behavioral: Negative for confusion.  All other systems reviewed and are negative.    Physical Exam  BP 150/84  Pulse 98  Temp(Src) 98.2 F (36.8 C) (Oral)  Resp 24  Ht 5\' 4"  (1.626 m)  Wt 168 lb (76.204 kg)  BMI 28.84 kg/m2  SpO2 97%  Physical Exam  Constitutional: He is oriented to person, place, and time. He appears well-developed and well-nourished. No distress.  HENT:  Head: Normocephalic and atraumatic.  Mouth/Throat: Oropharynx is clear and moist.  Eyes: Conjunctivae are normal. Pupils are equal, round, and reactive to light. No scleral icterus.  Neck: Neck supple. No thyromegaly present.  Cardiovascular: Regular rhythm and normal heart sounds.  Tachycardia present.   Pulmonary/Chest: He has wheezes (diffuse). He has no rales.  Abdominal: Soft. There is no tenderness.  Musculoskeletal: He exhibits no edema and no tenderness.  Neurological: He is alert and oriented to person, place, and time.  Skin: Skin is warm and dry. He is not diaphoretic.  Psychiatric: He has a normal mood and affect. His behavior is normal.    ED Course  Procedures  OTHER DATA REVIEWED: Nursing notes, vital signs, and past medical records reviewed.   DIAGNOSTIC STUDIES: Oxygen Saturation is 88% on RA, hypoxia by my interpretation.   Ordered breathing treatment and supplemental O2, see course of care for repeat pulse ox  interpretation.   Date: 11/11/2010  Rate: 87  Rhythm: normal sinus rhythm  QRS Axis: normal  Intervals: normal  ST/T Wave abnormalities: normal  Conduction Disutrbances:none  Narrative Interpretation: Left atrial enlargement  Old EKG Reviewed: unchanged and compared with 07/12/10   LABS / RADIOLOGY:  Results for orders placed  during the hospital encounter of 11/11/10  CBC      Component Value Range   WBC 15.3 (*) 4.0 - 10.5 (K/uL)   RBC 4.85  4.22 - 5.81 (MIL/uL)   Hemoglobin 15.5  13.0 - 17.0 (g/dL)   HCT 40.9  81.1 - 91.4 (%)   MCV 94.6  78.0 - 100.0 (fL)   MCH 32.0  26.0 - 34.0 (pg)   MCHC 33.8  30.0 - 36.0 (g/dL)   RDW 78.2  95.6 - 21.3 (%)   Platelets 281  150 - 400 (K/uL)  DIFFERENTIAL      Component Value Range   Neutrophils Relative 73  43 - 77 (%)   Neutro Abs 11.1 (*) 1.7 - 7.7 (K/uL)   Lymphocytes Relative 16  12 - 46 (%)   Lymphs Abs 2.4  0.7 - 4.0 (K/uL)   Monocytes Relative 11  3 - 12 (%)   Monocytes Absolute 1.7 (*) 0.1 - 1.0 (K/uL)   Eosinophils Relative 0  0 - 5 (%)   Eosinophils Absolute 0.0  0.0 - 0.7 (K/uL)   Basophils Relative 1  0 - 1 (%)   Basophils Absolute 0.1  0.0 - 0.1 (K/uL)  BASIC METABOLIC PANEL      Component Value Range   Sodium 134 (*) 135 - 145 (mEq/L)   Potassium 4.9  3.5 - 5.1 (mEq/L)   Chloride 93 (*) 96 - 112 (mEq/L)   CO2 31  19 - 32 (mEq/L)   Glucose, Bld 151 (*) 70 - 99 (mg/dL)   BUN 12  6 - 23 (mg/dL)   Creatinine, Ser 0.86  0.50 - 1.35 (mg/dL)   Calcium 57.8  8.4 - 10.5 (mg/dL)   GFR calc non Af Amer >60  >60 (mL/min)   GFR calc Af Amer >60  >60 (mL/min)  TROPONIN I      Component Value Range   Troponin I <0.30  <0.30 (ng/mL)  URINALYSIS, ROUTINE W REFLEX MICROSCOPIC      Component Value Range   Color, Urine YELLOW  YELLOW    Appearance CLEAR  CLEAR    Specific Gravity, Urine <1.005 (*) 1.005 - 1.030    pH 6.5  5.0 - 8.0    Glucose, UA NEGATIVE  NEGATIVE (mg/dL)   Hgb urine dipstick NEGATIVE  NEGATIVE    Bilirubin Urine NEGATIVE  NEGATIVE    Ketones, ur NEGATIVE  NEGATIVE (mg/dL)   Protein, ur NEGATIVE  NEGATIVE (mg/dL)   Urobilinogen, UA 0.2  0.0 - 1.0 (mg/dL)   Nitrite NEGATIVE  NEGATIVE    Leukocytes, UA NEGATIVE  NEGATIVE    XR CHEST - 2 VIEW  Comparison: 07/11/2010  Findings: Chronic changes of COPD remains stable. No evidence  of acute or superimposed infiltrate. No evidence of pleural effusion. Heart size is normal. No mass or lymphadenopathy identified.  IMPRESSION: Stable COPD. No acute findings.  Original Report Authenticated By: Danae Orleans, M.D.   ED COURSE / COORDINATION OF CARE: 19:42. RA pulse ox 88% per nurse, ordered supplemental O2, will repeat 20:56. Ordered CBC, BMP, Troponin I, U/A, 2-view CXR, Albuterol nebulizer given by RT, Solu-Medrol 125mg   1:52 AM Reevaluation with update  and discussion. After initial assessment and treatment, an updated evaluation reveals patient is improved, feels comfortable and feels like he is ready to go home. Repeat vital signs are normal and stable with oxygen saturation in the mid 90s on 4 L nasal cannula oxygen. Patient has oxygen and medications at home to treat his acute problem. He is currently on Avelox for infection. His family is with him and his given the instructions. Patient is strongly encouraged to stop smoking.Mancel Bale L   MDM: COPD with acute bronchitis, improved in the ED with aggressive nebulizer therapy. Doubt pneumonia ACS PE or metabolic instability.    IMPRESSION:     Final diagnoses: COPD exaerbation     PLAN: discharge The patient is to return the emergency department if there is any worsening of symptoms. I have reviewed the discharge instructions with the patient and family.   CONDITION ON DISCHARGE: stable   MEDICATIONS GIVEN IN THE E.D.  Medications  omega-3 fish oil (MAXEPA) 1000 MG CAPS capsule (not administered)  Cholecalciferol (VITAMIN D3) 1000 UNITS CAPS (not administered)  Garlic 1000 MG CAPS (not administered)  budesonide-formoterol (SYMBICORT) 160-4.5 MCG/ACT inhaler (not administered)  albuterol-ipratropium (COMBIVENT) 18-103 MCG/ACT inhaler (not administered)  omeprazole (PRILOSEC) 20 MG capsule (not administered)  Multiple Vitamins-Minerals (CENTRUM) tablet (not administered)  ALPRAZolam (XANAX) 1 MG  tablet (not administered)  HYDROcodone-acetaminophen (NORCO) 10-325 MG per tablet (not administered)  simvastatin (ZOCOR) 40 MG tablet (not administered)  methylPREDNISolone sodium succinate (SOLU-MEDROL) injection 125 mg (125 mg Intravenous Given 11/11/10 2100)  albuterol (PROVENTIL) (5 MG/ML) 0.5% nebulizer solution 20 mg (20 mg Nebulization Given 11/11/10 2141)     DISCHARGE MEDICATIONS: New Prescriptions   No medications on file         Flint Melter, MD 11/12/10 0201

## 2011-04-21 ENCOUNTER — Other Ambulatory Visit: Payer: Self-pay

## 2011-04-21 ENCOUNTER — Inpatient Hospital Stay (HOSPITAL_COMMUNITY)
Admission: EM | Admit: 2011-04-21 | Discharge: 2011-04-25 | DRG: 189 | Disposition: A | Discharge status: Home / Self Care | Payer: Medicare Other | Attending: Internal Medicine | Admitting: Internal Medicine

## 2011-04-21 ENCOUNTER — Emergency Department (HOSPITAL_COMMUNITY): Payer: Medicare Other

## 2011-04-21 ENCOUNTER — Encounter (HOSPITAL_COMMUNITY): Payer: Self-pay | Admitting: *Deleted

## 2011-04-21 DIAGNOSIS — I10 Essential (primary) hypertension: Secondary | ICD-10-CM | POA: Diagnosis present

## 2011-04-21 DIAGNOSIS — J9620 Acute and chronic respiratory failure, unspecified whether with hypoxia or hypercapnia: Principal | ICD-10-CM | POA: Diagnosis present

## 2011-04-21 DIAGNOSIS — I2510 Atherosclerotic heart disease of native coronary artery without angina pectoris: Secondary | ICD-10-CM | POA: Diagnosis present

## 2011-04-21 DIAGNOSIS — F411 Generalized anxiety disorder: Secondary | ICD-10-CM | POA: Diagnosis present

## 2011-04-21 DIAGNOSIS — F17200 Nicotine dependence, unspecified, uncomplicated: Secondary | ICD-10-CM | POA: Diagnosis present

## 2011-04-21 DIAGNOSIS — R0602 Shortness of breath: Secondary | ICD-10-CM | POA: Diagnosis present

## 2011-04-21 DIAGNOSIS — J4489 Other specified chronic obstructive pulmonary disease: Secondary | ICD-10-CM | POA: Diagnosis present

## 2011-04-21 DIAGNOSIS — J449 Chronic obstructive pulmonary disease, unspecified: Secondary | ICD-10-CM | POA: Diagnosis present

## 2011-04-21 DIAGNOSIS — R911 Solitary pulmonary nodule: Secondary | ICD-10-CM | POA: Diagnosis present

## 2011-04-21 DIAGNOSIS — J441 Chronic obstructive pulmonary disease with (acute) exacerbation: Secondary | ICD-10-CM | POA: Diagnosis present

## 2011-04-21 LAB — COMPREHENSIVE METABOLIC PANEL
ALT: 44 U/L (ref 0–53)
Alkaline Phosphatase: 84 U/L (ref 39–117)
BUN: 12 mg/dL (ref 6–23)
CO2: 38 mEq/L — ABNORMAL HIGH (ref 19–32)
GFR calc Af Amer: >90 mL/min (ref >90)
GFR calc non Af Amer: >90 mL/min (ref >90)
Glucose, Bld: 184 mg/dL — ABNORMAL HIGH (ref 70–99)
Potassium: 4.2 mEq/L (ref 3.5–5.1)
Sodium: 135 mEq/L (ref 135–145)
Total Bilirubin: 0.2 mg/dL — ABNORMAL LOW (ref 0.3–1.2)
Total Protein: 7.1 g/dL (ref 6.0–8.3)

## 2011-04-21 LAB — CBC
Hemoglobin: 15.3 g/dL (ref 13.0–17.0)
MCH: 31.7 pg (ref 26.0–34.0)
MCV: 98.3 fL (ref 78.0–100.0)
Platelets: 215 10*3/uL (ref 150–400)
RBC: 4.83 MIL/uL (ref 4.22–5.81)
WBC: 15.8 10*3/uL — ABNORMAL HIGH (ref 4.0–10.5)

## 2011-04-21 LAB — BLOOD GAS, ARTERIAL
Acid-Base Excess: 9.5 mmol/L — ABNORMAL HIGH (ref 0.0–2.0)
O2 Content: 2 L/min
O2 Saturation: 86.8 %
Patient temperature: 37
TCO2: 32 mmol/L (ref 0–100)

## 2011-04-21 LAB — DIFFERENTIAL
Eosinophils Absolute: 0 10*3/uL (ref 0.0–0.7)
Lymphocytes Relative: 13 % (ref 12–46)
Lymphs Abs: 2 10*3/uL (ref 0.7–4.0)
Monocytes Relative: 5 % (ref 3–12)
Neutrophils Relative %: 82 % — ABNORMAL HIGH (ref 43–77)

## 2011-04-21 MED ORDER — ALBUTEROL SULFATE (5 MG/ML) 0.5% IN NEBU
2.5000 mg | INHALATION_SOLUTION | Freq: Once | RESPIRATORY_TRACT | Status: AC
  Administered 2011-04-21: 2.5 mg via RESPIRATORY_TRACT
  Filled 2011-04-21: qty 0.5

## 2011-04-21 MED ORDER — IPRATROPIUM BROMIDE 0.02 % IN SOLN
0.5000 mg | Freq: Once | RESPIRATORY_TRACT | Status: AC
  Administered 2011-04-21: 0.5 mg via RESPIRATORY_TRACT
  Filled 2011-04-21: qty 2.5

## 2011-04-21 MED ORDER — METHYLPREDNISOLONE SODIUM SUCC 125 MG IJ SOLR
125.0000 mg | Freq: Once | INTRAMUSCULAR | Status: AC
  Administered 2011-04-21: 125 mg via INTRAVENOUS
  Filled 2011-04-21: qty 2

## 2011-04-21 MED ORDER — MOXIFLOXACIN HCL IN NACL 400 MG/250ML IV SOLN
400.0000 mg | Freq: Once | INTRAVENOUS | Status: AC
  Administered 2011-04-22: 400 mg via INTRAVENOUS
  Filled 2011-04-21: qty 250

## 2011-04-21 NOTE — ED Notes (Signed)
Pt reporting some increased ease of breathing following nebulizer treatment.

## 2011-04-21 NOTE — ED Provider Notes (Addendum)
History   This chart was scribed for [employer]. Colon Branch, MD by Sofie Rower. The patient was seen in room APA16A/APA16A and the patient's care was started at 9:18PM.    CSN: [id number]  Arrival date & time 04/21/11  2022   First MD Initiated Contact with Patient 04/21/11 2044      Chief Complaint  Patient presents with  . Shortness of Breath    (Consider location/radiation/quality/duration/timing/severity/associated sxs/prior treatment) HPI  [name] [name] is a 71 y.o. male who presents to the Emergency Department complaining of moderate, constant SOB onset three days with associated symptoms of vomiting, productive cough, fever, sweats. Pt is on 2 L of oxygen at home. Pt states that he is able to sleep with three pillows. Pt has taken prednisone pills yesterday. Pt was seen previously for similar symptoms by Dr. Prudy Feeler. Pt denies any other medical problems.  PCP is Dr. Prudy Feeler.   Past Medical History  Diagnosis Date  . COPD (chronic obstructive pulmonary disease)   . Asthma     Past Surgical History  Procedure Date  . Fracture surgery     History reviewed. No pertinent family history.  History  Substance Use Topics  . Smoking status: Current Everyday Smoker -- 0.5 packs/day  . Smokeless tobacco: Never Used  . Alcohol Use: No      Review of Systems  10 Systems reviewed and are negative for acute change except as noted in the HPI.   Allergies  Iohexol  Home Medications   Current Outpatient Rx  Name Route Sig Dispense Refill  . IPRATROPIUM-ALBUTEROL 18-103 MCG/ACT IN AERO Inhalation Inhale 2 puffs into the lungs 4 (four) times daily.      Marland Kitchen ALPRAZOLAM 1 MG PO TABS Oral Take 1 mg by mouth 2 (two) times daily as needed. anxiety     . BUDESONIDE-FORMOTEROL FUMARATE 160-4.5 MCG/ACT IN AERO Inhalation Inhale 2 puffs into the lungs 2 (two) times daily.      Marland Kitchen VITAMIN D3 1000 UNITS PO CAPS Oral Take 1 capsule by mouth daily.      Marland Kitchen GARLIC 1000 MG PO CAPS Oral Take  1 capsule by mouth daily.      Marland Kitchen HYDROCODONE-ACETAMINOPHEN 10-325 MG PO TABS Oral Take 1 tablet by mouth 4 (four) times daily as needed. pain     . CENTRUM PO TABS Oral Take 1 tablet by mouth daily.      . OMEGA-3 FISH OIL 1000 MG PO CAPS Oral Take 1 capsule by mouth daily.      Marland Kitchen OMEPRAZOLE 20 MG PO CPDR Oral Take 20 mg by mouth 2 (two) times daily.      Marland Kitchen SIMVASTATIN 40 MG PO TABS Oral Take 40 mg by mouth at bedtime.        BP 135/92  Temp(Src) 97.8 F (36.6 C) (Oral)  Resp 24  Ht 5\' 5"  (1.651 m)  Wt 164 lb (74.39 kg)  BMI 27.29 kg/m2  SpO2 97%  Physical Exam  Nursing note and vitals reviewed. Constitutional: He is oriented to person, place, and time. He appears well-developed and well-nourished. No distress.  HENT:  Head: Normocephalic and atraumatic.  Nose: Nose normal.  Eyes: EOM are normal. Pupils are equal, round, and reactive to light.  Neck: Normal range of motion. Neck supple. No tracheal deviation present.  Cardiovascular: Normal rate.        Tachycardic.   Pulmonary/Chest: Effort normal. No respiratory distress. He has wheezes.  Increased effort, use of expiratory muscles, diffuse wheezing throughout all fields, productive cough.  Abdominal: Soft. He exhibits no distension.  Musculoskeletal: Normal range of motion. He exhibits no edema.  Neurological: He is alert and oriented to person, place, and time. No sensory deficit.  Skin: Skin is warm and dry.  Psychiatric: He has a normal mood and affect. His behavior is normal.    ED Course  Procedures (including critical care time)  DIAGNOSTIC STUDIES: Oxygen Saturation is 97% on [place], adequate by my interpretation.    COORDINATION OF CARE:  Results for orders placed during the hospital encounter of 04/21/11  CBC      Component Value Range   WBC 15.8 (*) 4.0 - 10.5 (K/uL)   RBC 4.83  4.22 - 5.81 (MIL/uL)   Hemoglobin 15.3  13.0 - 17.0 (g/dL)   HCT 16.1  09.6 - 04.5 (%)   MCV 98.3  78.0 - 100.0 (fL)   MCH  31.7  26.0 - 34.0 (pg)   MCHC 32.2  30.0 - 36.0 (g/dL)   RDW 40.9  81.1 - 91.4 (%)   Platelets 215  150 - 400 (K/uL)  DIFFERENTIAL      Component Value Range   Neutrophils Relative 82 (*) 43 - 77 (%)   Neutro Abs 13.0 (*) 1.7 - 7.7 (K/uL)   Lymphocytes Relative 13  12 - 46 (%)   Lymphs Abs 2.0  0.7 - 4.0 (K/uL)   Monocytes Relative 5  3 - 12 (%)   Monocytes Absolute 0.8  0.1 - 1.0 (K/uL)   Eosinophils Relative 0  0 - 5 (%)   Eosinophils Absolute 0.0  0.0 - 0.7 (K/uL)   Basophils Relative 0  0 - 1 (%)   Basophils Absolute 0.0  0.0 - 0.1 (K/uL)  COMPREHENSIVE METABOLIC PANEL      Component Value Range   Sodium 135  135 - 145 (mEq/L)   Potassium 4.2  3.5 - 5.1 (mEq/L)   Chloride 94 (*) 96 - 112 (mEq/L)   CO2 38 (*) 19 - 32 (mEq/L)   Glucose, Bld 184 (*) 70 - 99 (mg/dL)   BUN 12  6 - 23 (mg/dL)   Creatinine, Ser 7.82  0.50 - 1.35 (mg/dL)   Calcium 95.6  8.4 - 10.5 (mg/dL)   Total Protein 7.1  6.0 - 8.3 (g/dL)   Albumin 3.7  3.5 - 5.2 (g/dL)   AST 30  0 - 37 (U/L)   ALT 44  0 - 53 (U/L)   Alkaline Phosphatase 84  39 - 117 (U/L)   Total Bilirubin 0.2 (*) 0.3 - 1.2 (mg/dL)   GFR calc non Af Amer >90  >90 (mL/min)   GFR calc Af Amer >90  >90 (mL/min)  BLOOD GAS, ARTERIAL      Component Value Range   O2 Content 2.0     Delivery systems NASAL CANNULA     pH, Arterial 7.293 (*) 7.350 - 7.450    pCO2 arterial 76.9 (*) 35.0 - 45.0 (mmHg)   pO2, Arterial 54.1 (*) 80.0 - 100.0 (mmHg)   Bicarbonate 36.0 (*) 20.0 - 24.0 (mEq/L)   TCO2 32.0  0 - 100 (mmol/L)   Acid-Base Excess 9.5 (*) 0.0 - 2.0 (mmol/L)   O2 Saturation 86.8     Patient temperature 37.0     Collection site LEFT RADIAL     Drawn by COLLECTED BY RT     Sample type ARTERIAL     Allens test (pass/fail)  PASS  PASS    Dg Chest Portable 1 View  04/21/2011  *RADIOLOGY REPORT*  Clinical Data: Short of breath.  Chest pain.  PORTABLE CHEST - 1 VIEW  Comparison: 11/11/2010.  Findings: Emphysema is present with the chronically  increased interstitial markings.  There is no superimposed airspace disease. No pleural effusion.  Lung volumes slightly lower than on the prior exam. Monitoring leads are projected over the chest.  The cardiopericardial silhouette appears within normal limits. Saber sheath trachea is present.  IMPRESSION: Emphysema and chronic interstitial changes without acute cardiopulmonary disease.  Original Report Authenticated By: Andreas Newport, M.D.    Date: 04/21/2011  2029  [id number]  Rhythm: normal sinus rhythm  QRS Axis: normal  Intervals: normal  ST/T Wave abnormalities: normal  Conduction Disutrbances:none  Narrative Interpretation:   Old EKG Reviewed: unchanged c/w 11/11/10   MDM  Patient with COPD here with shortness of breath that has gotten worse over several days. Has used nebulizer at home with no change. Here with increased work of breathing. ABG reflects O2 retention. Given nebulizer treatment and begun on BIPAP with improvement.Arranged for admission.Pt feels improved after observation and/or treatment in ED.Patient / Family / Caregiver informed of clinical course, understand medical decision-making process, and agree with plan.Pt stable in ED with no significant deterioration in condition.The patient appears reasonably stabilized for admission considering the current resources, flow, and capabilities available in the ED at this time, and I doubt any other [facility] requiring further screening and/or treatment in the ED prior to admission.  9:25PM- EDP at bedside discusses treatment plan. 2350 Spoke with Dr.David, hospitalist who will admit the patient to the hospital.  CRITICAL CARE Performed by: Annamarie Dawley.   Total critical care time: 50 Critical care time was exclusive of separately billable procedures and treating other patients.  Critical care was necessary to treat or prevent imminent or life-threatening deterioration.  Critical care was time spent personally by me on the following  activities: development of treatment plan with patient and/or surrogate as well as nursing, discussions with consultants, evaluation of patient's response to treatment, examination of patient, obtaining history from patient or surrogate, ordering and performing treatments and interventions, ordering and review of laboratory studies, ordering and review of radiographic studies, pulse oximetry and re-evaluation of patient's condition.      Nicoletta Dress. Colon Branch, MD 04/22/11 0134  Nicoletta Dress. Colon Branch, MD 04/22/11 3329

## 2011-04-21 NOTE — ED Notes (Signed)
Respiratory therapy at bedside.

## 2011-04-21 NOTE — ED Notes (Addendum)
Pt reports SOB began about 3 days ago.  Bronchitis has been in household. Pt reports began taking breathing treatments and steroids 3 days ago , after seeing PCP.  Denies any improvement in symptoms.  Per EMS, pt has had 3 breathing treatments in last 2 hours with no relief.

## 2011-04-22 ENCOUNTER — Encounter (HOSPITAL_COMMUNITY): Payer: Self-pay

## 2011-04-22 LAB — BASIC METABOLIC PANEL
BUN: 11 mg/dL (ref 6–23)
Chloride: 95 mEq/L — ABNORMAL LOW (ref 96–112)
GFR calc Af Amer: >90 mL/min (ref >90)
GFR calc non Af Amer: >90 mL/min (ref >90)
Glucose, Bld: 141 mg/dL — ABNORMAL HIGH (ref 70–99)
Potassium: 4.4 mEq/L (ref 3.5–5.1)
Sodium: 137 mEq/L (ref 135–145)

## 2011-04-22 LAB — CBC
HCT: 47.7 % (ref 39.0–52.0)
Hemoglobin: 15.2 g/dL (ref 13.0–17.0)
MCH: 31.1 pg (ref 26.0–34.0)
RBC: 4.88 MIL/uL (ref 4.22–5.81)

## 2011-04-22 MED ORDER — ALPRAZOLAM 0.5 MG PO TABS
1.0000 mg | ORAL_TABLET | Freq: Once | ORAL | Status: AC
  Administered 2011-04-22: 1 mg via ORAL
  Filled 2011-04-22: qty 2

## 2011-04-22 MED ORDER — IPRATROPIUM BROMIDE 0.02 % IN SOLN
0.5000 mg | Freq: Four times a day (QID) | RESPIRATORY_TRACT | Status: DC
  Administered 2011-04-22 – 2011-04-25 (×13): 0.5 mg via RESPIRATORY_TRACT
  Filled 2011-04-22 (×13): qty 2.5

## 2011-04-22 MED ORDER — SODIUM CHLORIDE 0.9 % IJ SOLN
3.0000 mL | Freq: Two times a day (BID) | INTRAMUSCULAR | Status: DC
  Administered 2011-04-22 – 2011-04-24 (×3): 3 mL via INTRAVENOUS
  Filled 2011-04-22 (×3): qty 3

## 2011-04-22 MED ORDER — PANTOPRAZOLE SODIUM 40 MG PO TBEC
40.0000 mg | DELAYED_RELEASE_TABLET | Freq: Every day | ORAL | Status: DC
  Administered 2011-04-22 – 2011-04-24 (×3): 40 mg via ORAL
  Filled 2011-04-22 (×3): qty 1

## 2011-04-22 MED ORDER — METHYLPREDNISOLONE SODIUM SUCC 125 MG IJ SOLR
125.0000 mg | Freq: Four times a day (QID) | INTRAMUSCULAR | Status: DC
  Administered 2011-04-22 – 2011-04-23 (×4): 125 mg via INTRAVENOUS
  Filled 2011-04-22 (×4): qty 2

## 2011-04-22 MED ORDER — METHYLPREDNISOLONE SODIUM SUCC 125 MG IJ SOLR
80.0000 mg | Freq: Four times a day (QID) | INTRAMUSCULAR | Status: DC
  Administered 2011-04-22: 80 mg via INTRAVENOUS
  Filled 2011-04-22: qty 2

## 2011-04-22 MED ORDER — ALBUTEROL SULFATE (5 MG/ML) 0.5% IN NEBU
2.5000 mg | INHALATION_SOLUTION | Freq: Four times a day (QID) | RESPIRATORY_TRACT | Status: DC
  Administered 2011-04-22 – 2011-04-25 (×13): 2.5 mg via RESPIRATORY_TRACT
  Filled 2011-04-22 (×13): qty 0.5

## 2011-04-22 MED ORDER — HYDROCODONE-ACETAMINOPHEN 10-325 MG PO TABS
1.0000 | ORAL_TABLET | ORAL | Status: DC | PRN
  Administered 2011-04-22 – 2011-04-24 (×7): 1 via ORAL
  Filled 2011-04-22 (×7): qty 1

## 2011-04-22 MED ORDER — SODIUM CHLORIDE 0.9 % IV SOLN: 250.0000 mL | INTRAVENOUS | Status: DC | PRN

## 2011-04-22 MED ORDER — ALPRAZOLAM 0.5 MG PO TABS
1.0000 mg | ORAL_TABLET | Freq: Two times a day (BID) | ORAL | Status: DC | PRN
  Administered 2011-04-22 – 2011-04-23 (×4): 1 mg via ORAL
  Filled 2011-04-22: qty 2
  Filled 2011-04-22 (×2): qty 1
  Filled 2011-04-22 (×2): qty 2

## 2011-04-22 MED ORDER — ALBUTEROL SULFATE (5 MG/ML) 0.5% IN NEBU
2.5000 mg | INHALATION_SOLUTION | RESPIRATORY_TRACT | Status: DC | PRN
  Administered 2011-04-25: 2.5 mg via RESPIRATORY_TRACT
  Filled 2011-04-22: qty 0.5

## 2011-04-22 MED ORDER — MOXIFLOXACIN HCL IN NACL 400 MG/250ML IV SOLN
400.0000 mg | INTRAVENOUS | Status: DC
  Administered 2011-04-23: 400 mg via INTRAVENOUS
  Filled 2011-04-22: qty 250

## 2011-04-22 MED ORDER — SIMVASTATIN 20 MG PO TABS
40.0000 mg | ORAL_TABLET | Freq: Every day | ORAL | Status: DC
  Administered 2011-04-22 – 2011-04-24 (×3): 40 mg via ORAL
  Filled 2011-04-22 (×3): qty 2

## 2011-04-22 MED ORDER — GUAIFENESIN-DM 100-10 MG/5ML PO SYRP
5.0000 mL | ORAL_SOLUTION | ORAL | Status: DC | PRN
  Administered 2011-04-22 – 2011-04-25 (×2): 5 mL via ORAL
  Filled 2011-04-22 (×2): qty 5

## 2011-04-22 MED ORDER — SODIUM CHLORIDE 0.9 % IJ SOLN: 3.0000 mL | INTRAMUSCULAR | Status: DC | PRN

## 2011-04-22 NOTE — H&P (Signed)
Chief Complaint:  Worsening shortness of breath and wheezing for several days  HPI: 71 year old male with chronic respiratory failure secondary COPD and tobacco abuse was on 2 L nasal cannula at home chronically who comes in with 2-3 days of worsening shortness of breath and wheezing. He denies any fevers nausea vomiting chest pain or abdominal pain. He denies any sick contacts. He continues to smoke tobacco products. He's received Solu-Medrol frequent mural nebs and BiPAP in the emergency department and feels much better already.  Review of Systems:  Otherwise negative and  Past Medical History: Past Medical History  Diagnosis Date  . COPD (chronic obstructive pulmonary disease)   . Asthma    Past Surgical History  Procedure Date  . Fracture surgery     Medications: Prior to Admission medications   Medication Sig Start Date End Date Taking? Authorizing Provider  albuterol-ipratropium (COMBIVENT) 18-103 MCG/ACT inhaler Inhale 2 puffs into the lungs 4 (four) times daily.      Historical Provider, MD  ALPRAZolam Prudy Feeler) 1 MG tablet Take 1 mg by mouth 2 (two) times daily as needed. anxiety     Historical Provider, MD  budesonide-formoterol (SYMBICORT) 160-4.5 MCG/ACT inhaler Inhale 2 puffs into the lungs 2 (two) times daily.      Historical Provider, MD  Cholecalciferol (VITAMIN D3) 1000 UNITS CAPS Take 1 capsule by mouth daily.      Historical Provider, MD  Garlic 1000 MG CAPS Take 1 capsule by mouth daily.      Historical Provider, MD  HYDROcodone-acetaminophen (NORCO) 10-325 MG per tablet Take 1 tablet by mouth 4 (four) times daily as needed. pain     Historical Provider, MD  Multiple Vitamins-Minerals (CENTRUM) tablet Take 1 tablet by mouth daily.      Historical Provider, MD  omega-3 fish oil (MAXEPA) 1000 MG CAPS capsule Take 1 capsule by mouth daily.      Historical Provider, MD  omeprazole (PRILOSEC) 20 MG capsule Take 20 mg by mouth 2 (two) times daily.      Historical  Provider, MD  simvastatin (ZOCOR) 40 MG tablet Take 40 mg by mouth at bedtime.      Historical Provider, MD    Allergies:   Allergies  Allergen Reactions  . Iohexol      Code: HIVES, Desc: PT STATES HE BROKE OUT IN HIVES AND RASH AFTER CT NECK EARLY SEPT 2011; NO RESP PROBLEMS; NEEDS PRE-MEDS; MKS, Onset Date: [id number]     Social History:  reports that he has been smoking.  He has never used smokeless tobacco. He reports that he uses illicit drugs about 3 times per week. He reports that he does not drink alcohol.  Family History: History reviewed. No pertinent family history.  Physical Exam: Filed Vitals:   04/21/11 2023 04/21/11 2135 04/21/11 2234 04/22/11 0124  BP: 135/92  162/87 143/73  Pulse:   91 96  Temp: 97.8 F (36.6 C)   97.7 F (36.5 C)  TempSrc: Oral   Oral  Resp: 24  40 22  Height: 5\' 5"  (1.651 m)     Weight: 74.39 kg (164 lb)     SpO2: 97% 100% 99% 92%   BP 143/73  Pulse 96  Temp(Src) 97.7 F (36.5 C) (Oral)  Resp 22  Ht 5\' 5"  (1.651 m)  Wt 74.39 kg (164 lb)  BMI 27.29 kg/m2  SpO2 92% General appearance: alert, cooperative and mild distress Lungs: wheezes bilaterally Heart: tachycardic RR, no m/r/g Abdomen: soft, non-tender; bowel sounds normal;  no masses,  no organomegaly Extremities: extremities normal, atraumatic, no cyanosis or edema Pulses: 2+ and symmetric Skin: Skin color, texture, turgor normal. No rashes or lesions Neurologic: Grossly normal    Labs on Admission:   [facility] 04/21/11 2142  NA 135  K 4.2  CL 94*  CO2 38*  GLUCOSE 184*  BUN 12  CREATININE 0.72  CALCIUM 10.1  MG --  PHOS --    Basename 04/21/11 2142  AST 30  ALT 44  ALKPHOS 84  BILITOT 0.2*  PROT 7.1  ALBUMIN 3.7    Basename 04/21/11 2142  WBC 15.8*  NEUTROABS 13.0*  HGB 15.3  HCT 47.5  MCV 98.3  PLT 215     Radiological Exams on Admission: Dg Chest Portable 1 View  04/21/2011  *RADIOLOGY REPORT*  Clinical Data: Short of breath.  Chest pain.   PORTABLE CHEST - 1 VIEW  Comparison: 11/11/2010.  Findings: Emphysema is present with the chronically increased interstitial markings.  There is no superimposed airspace disease. No pleural effusion.  Lung volumes slightly lower than on the prior exam. Monitoring leads are projected over the chest.  The cardiopericardial silhouette appears within normal limits. Saber sheath trachea is present.  IMPRESSION: Emphysema and chronic interstitial changes without acute cardiopulmonary disease.  Original Report Authenticated By: Andreas Newport, M.D.    Assessment/Plan Present on Admission:  71 year old male with acute on chronic respiratory failure secondary COPD exacerbation  .Acute and chronic respiratory failure (acute-on-chronic) IV Solu-Medrol frequent nebulizers Avelox BiPAP placed in stepped-down overnight for close observation patient is full code  .HYPERTENSION .C O P D .SOB (shortness of breath)  [name] [name] A [phone] 04/22/2011, 2:29 AM

## 2011-04-22 NOTE — ED Notes (Signed)
Pt's son provided his contact information.  Greely Atiyeh:  Home [phone].  Cell [phone]

## 2011-04-22 NOTE — Progress Notes (Signed)
Subjective: This man feels better than when he was admitted already. He describes a 2 day history of cough productive of yellow/brown sputum associated with dyspnea/wheezing. He has continued to be a smoker. He also has a right upper lobe nodule which is being monitored closely by his pulmonologist.           Physical Exam: Blood pressure 148/100, pulse 88, temperature 97.4 F (36.3 C), temperature source Oral, resp. rate 26, height 5\' 5"  (1.651 m), weight 75.6 kg (166 lb 10.7 oz), SpO2 94.00%. He looks systemically well and is not toxic or septic. He appears to have slight increased work of breathing. He is able to talk in sentences. There is no peripheral or central cyanosis. Lung fields show bilateral wheezing which is somewhat tight. Heart sounds are present and normal and in sinus rhythm. He is alert and orientated.   Investigations:  Recent Results (from the past 240 hour(s))  MRSA PCR SCREENING     Status: Normal   Collection Time   04/22/11  2:27 AM      Component Value Range Status Comment   MRSA by PCR NEGATIVE  NEGATIVE  Final      Basic Metabolic Panel:  Basename 04/22/11 0445 04/21/11 2142  NA 137 135  K 4.4 4.2  CL 95* 94*  CO2 39* 38*  GLUCOSE 141* 184*  BUN 11 12  CREATININE 0.66 0.72  CALCIUM 10.3 10.1  MG -- --  PHOS -- --   Liver Function Tests:  [facility] 04/21/11 2142  AST 30  ALT 44  ALKPHOS 84  BILITOT 0.2*  PROT 7.1  ALBUMIN 3.7     CBC:  Basename 04/22/11 0445 04/21/11 2142  WBC 11.8* 15.8*  NEUTROABS -- 13.0*  HGB 15.2 15.3  HCT 47.7 47.5  MCV 97.7 98.3  PLT 210 215    Dg Chest Portable 1 View  04/21/2011  *RADIOLOGY REPORT*  Clinical Data: Short of breath.  Chest pain.  PORTABLE CHEST - 1 VIEW  Comparison: 11/11/2010.  Findings: Emphysema is present with the chronically increased interstitial markings.  There is no superimposed airspace disease. No pleural effusion.  Lung volumes slightly lower than on the prior exam. Monitoring  leads are projected over the chest.  The cardiopericardial silhouette appears within normal limits. Saber sheath trachea is present.  IMPRESSION: Emphysema and chronic interstitial changes without acute cardiopulmonary disease.  Original Report Authenticated By: Andreas Newport, M.D.      Medications: I have reviewed the patient's current medications.  Impression: 1. Acute exacerbation of COPD. 2. Hypertension. 3. Right upper lobe pulmonary nodule, stable. 4. Nonobstructive coronary artery disease, stable.     Plan: 1. Increase IV steroids for the time being. 2. Continue with all other medications.     LOS: 1 day   Wilson Singer Pager [phone]  04/22/2011, 7:52 AM

## 2011-04-23 LAB — COMPREHENSIVE METABOLIC PANEL
ALT: 45 U/L (ref 0–53)
BUN: 15 mg/dL (ref 6–23)
CO2: 41 mEq/L (ref 19–32)
Calcium: 9.9 mg/dL (ref 8.4–10.5)
GFR calc Af Amer: >90 mL/min (ref >90)
GFR calc non Af Amer: >90 mL/min (ref >90)
Glucose, Bld: 182 mg/dL — ABNORMAL HIGH (ref 70–99)
Total Protein: 6.1 g/dL (ref 6.0–8.3)

## 2011-04-23 LAB — CBC
HCT: 46.7 % (ref 39.0–52.0)
Hemoglobin: 15.1 g/dL (ref 13.0–17.0)
MCH: 31.1 pg (ref 26.0–34.0)
MCHC: 32.3 g/dL (ref 30.0–36.0)
MCV: 96.1 fL (ref 78.0–100.0)
RBC: 4.86 MIL/uL (ref 4.22–5.81)

## 2011-04-23 LAB — INFLUENZA PANEL BY PCR (TYPE A & B)
H1N1 flu by pcr: NOT DETECTED
Influenza A By PCR: NEGATIVE

## 2011-04-23 MED ORDER — METHYLPREDNISOLONE SODIUM SUCC 125 MG IJ SOLR
125.0000 mg | Freq: Two times a day (BID) | INTRAMUSCULAR | Status: DC
  Administered 2011-04-23 (×2): 125 mg via INTRAVENOUS
  Filled 2011-04-23 (×2): qty 2

## 2011-04-23 MED ORDER — MOXIFLOXACIN HCL 400 MG PO TABS
400.0000 mg | ORAL_TABLET | Freq: Every day | ORAL | Status: DC
  Administered 2011-04-23 – 2011-04-24 (×2): 400 mg via ORAL
  Filled 2011-04-23 (×2): qty 1

## 2011-04-23 MED ORDER — MOXIFLOXACIN HCL IN NACL 400 MG/250ML IV SOLN
INTRAVENOUS | Status: AC
  Filled 2011-04-23: qty 250

## 2011-04-23 NOTE — Progress Notes (Addendum)
Subjective: This man continues to feel better although he still continues to wheeze significantly. He says that he is not back to his baseline yet.           Physical Exam: Blood pressure 136/76, pulse 77, temperature 97.7 F (36.5 C), temperature source Oral, resp. rate 23, height 5\' 5"  (1.651 m), weight 75.3 kg (166 lb 0.1 oz), SpO2 96.00%. He looks systemically well and is not toxic or septic. He appears to have slight increased work of breathing. He is able to talk in sentences. There is no peripheral or central cyanosis. Lung fields show bilateral wheezing which is now not as tight as yesterday. Heart sounds are present and normal and in sinus rhythm. He is alert and orientated.   Investigations:  Recent Results (from the past 240 hour(s))  MRSA PCR SCREENING     Status: Normal   Collection Time   04/22/11  2:27 AM      Component Value Range Status Comment   MRSA by PCR NEGATIVE  NEGATIVE  Final      Basic Metabolic Panel:  Basename 04/23/11 0450 04/22/11 0445  NA 142 137  K 3.9 4.4  CL 97 95*  CO2 41* 39*  GLUCOSE 182* 141*  BUN 15 11  CREATININE 0.66 0.66  CALCIUM 9.9 10.3  MG -- --  PHOS -- --   Liver Function Tests:  [facility] 04/23/11 0450 04/21/11 2142  AST 24 30  ALT 45 44  ALKPHOS 68 84  BILITOT 0.3 0.2*  PROT 6.1 7.1  ALBUMIN 3.1* 3.7     CBC:  Basename 04/23/11 0450 04/22/11 0445 04/21/11 2142  WBC 14.9* 11.8* --  NEUTROABS -- -- 13.0*  HGB 15.1 15.2 --  HCT 46.7 47.7 --  MCV 96.1 97.7 --  PLT 195 210 --    Dg Chest Portable 1 View  04/21/2011  *RADIOLOGY REPORT*  Clinical Data: Short of breath.  Chest pain.  PORTABLE CHEST - 1 VIEW  Comparison: 11/11/2010.  Findings: Emphysema is present with the chronically increased interstitial markings.  There is no superimposed airspace disease. No pleural effusion.  Lung volumes slightly lower than on the prior exam. Monitoring leads are projected over the chest.  The cardiopericardial silhouette  appears within normal limits. Saber sheath trachea is present.  IMPRESSION: Emphysema and chronic interstitial changes without acute cardiopulmonary disease.  Original Report Authenticated By: Andreas Newport, M.D.      Medications: I have reviewed the patient's current medications.  Impression: 1. Acute exacerbation of COPD, clinically improving. 2. Hypertension. 3. Right upper lobe pulmonary nodule, stable. 4. Nonobstructive coronary artery disease, stable.     Plan: 1. Reduce IV steroids to every 12 hours now. Continue with all other medications. Convert Avelox to oral. 2. Check influenza test. 3. Possible discharge home tomorrow if continues to improve in this fashion.     LOS: 2 days   Wilson Singer Pager [phone]  04/23/2011, 7:32 AM

## 2011-04-23 NOTE — Progress Notes (Signed)
Paged Critical to Onalee Hua at [phone]. CO2 41. Awaiting return call.

## 2011-04-24 MED ORDER — PREDNISONE 20 MG PO TABS
40.0000 mg | ORAL_TABLET | Freq: Every day | ORAL | Status: DC
  Administered 2011-04-24: 40 mg via ORAL
  Filled 2011-04-24: qty 2

## 2011-04-24 MED ORDER — FLUTICASONE-SALMETEROL 250-50 MCG/DOSE IN AEPB
1.0000 | INHALATION_SPRAY | Freq: Two times a day (BID) | RESPIRATORY_TRACT | Status: DC
  Administered 2011-04-24 (×2): 1 via RESPIRATORY_TRACT
  Filled 2011-04-24: qty 14

## 2011-04-24 MED ORDER — ALPRAZOLAM 0.5 MG PO TABS
1.0000 mg | ORAL_TABLET | Freq: Two times a day (BID) | ORAL | Status: DC | PRN
  Administered 2011-04-24 (×2): 1 mg via ORAL
  Filled 2011-04-24 (×2): qty 2

## 2011-04-24 NOTE — Progress Notes (Addendum)
Subjective: This man is stabilizing and somewhat improving but slowly. He has anxiety and he is actually hoping to delay discharge because of his home situation. His wife is sick and he also has a sick grandchild at home.           Physical Exam: Blood pressure 126/74, pulse 65, temperature 98.2 F (36.8 C), temperature source Oral, resp. rate 23, height 5\' 5"  (1.651 m), weight 74.9 kg (165 lb 2 oz), SpO2 94.00%. He looks systemically well and does not appear to have any significant increased work of breathing at rest. His saturations are 92-93% on 2-3 L of oxygen at rest. There is no peripheral central cyanosis. Lung fields show bilateral wheezing which is not particular type. His resting heart rate is in the 60s and 70s. He is alert and orientated.  Investigations:  Recent Results (from the past 240 hour(s))  MRSA PCR SCREENING     Status: Normal   Collection Time   04/22/11  2:27 AM      Component Value Range Status Comment   MRSA by PCR NEGATIVE  NEGATIVE  Final      Basic Metabolic Panel:  Basename 04/23/11 0450 04/22/11 0445  NA 142 137  K 3.9 4.4  CL 97 95*  CO2 41* 39*  GLUCOSE 182* 141*  BUN 15 11  CREATININE 0.66 0.66  CALCIUM 9.9 10.3  MG -- --  PHOS -- --   Liver Function Tests:  [facility] 04/23/11 0450 04/21/11 2142  AST 24 30  ALT 45 44  ALKPHOS 68 84  BILITOT 0.3 0.2*  PROT 6.1 7.1  ALBUMIN 3.1* 3.7     CBC:  Basename 04/23/11 0450 04/22/11 0445 04/21/11 2142  WBC 14.9* 11.8* --  NEUTROABS -- -- 13.0*  HGB 15.1 15.2 --  HCT 46.7 47.7 --  MCV 96.1 97.7 --  PLT 195 210 --    No results found.    Medications: I have reviewed the patient's current medications.  Impression: 1. Acute exacerbation of COPD, clinically improving. 2. Hypertension. 3. Right upper lobe pulmonary nodule, stable. 4. Nonobstructive coronary artery disease, stable.     Plan: 1. Discontinue IV steroids. Start oral steroids. Start Advair inhaler. 2. If  clinically stable, hopefully discharge home tomorrow.     LOS: 3 days   Wilson Singer Pager [phone]  04/24/2011, 7:36 AM

## 2011-04-25 MED ORDER — GUAIFENESIN ER 600 MG PO TB12: 1200.0000 mg | ORAL_TABLET | Freq: Two times a day (BID) | ORAL | Status: DC

## 2011-04-25 MED ORDER — MOXIFLOXACIN HCL 400 MG PO TABS: 400.0000 mg | ORAL_TABLET | Freq: Every day | ORAL | Status: DC

## 2011-04-25 MED ORDER — PREDNISONE 20 MG PO TABS: ORAL_TABLET | ORAL | Status: DC

## 2011-04-25 MED ORDER — SODIUM CHLORIDE 0.9 % IN NEBU
INHALATION_SOLUTION | RESPIRATORY_TRACT | Status: AC
  Administered 2011-04-25: 3 mL
  Filled 2011-04-25: qty 3

## 2011-04-25 NOTE — Discharge Summary (Signed)
Physician Discharge Summary  Patient ID: [name] [name] MRN: [id number] DOB/AGE: 70/28/1942 70 y.o. Primary Care Physician:Dr Prudy Feeler Admit date: 04/21/2011 Discharge date: 04/25/2011    Discharge Diagnoses:  1. Exacerbation of COPD. 2. Ongoing tobacco abuse. 3. Hypertension. 4. Anxiety.  Current Discharge Medication List    START taking these medications   Details  guaiFENesin (MUCINEX) 600 MG 12 hr tablet Take 2 tablets (1,200 mg total) by mouth 2 (two) times daily. Qty: 30 tablet, Refills: 0    predniSONE (DELTASONE) 20 MG tablet Take 2 tablets daily for 3 days, then 1 tablet daily for 3 days, then half tablet daily for 3 days, then STOP. Qty: 12 tablet, Refills: 0      CONTINUE these medications which have CHANGED   Details  moxifloxacin (AVELOX) 400 MG tablet Take 1 tablet (400 mg total) by mouth daily. For 10 days started on 04-20-11 Qty: 5 tablet, Refills: 0      CONTINUE these medications which have NOT CHANGED   Details  albuterol (PROVENTIL) (2.5 MG/3ML) 0.083% nebulizer solution Take 2.5 mg by nebulization every 6 (six) hours as needed. For shortness of breath     albuterol-ipratropium (COMBIVENT) 18-103 MCG/ACT inhaler Inhale 2 puffs into the lungs 4 (four) times daily.      ALPRAZolam (XANAX) 1 MG tablet Take 1 mg by mouth 2 (two) times daily as needed. anxiety    aspirin EC 81 MG tablet Take 81 mg by mouth daily.      budesonide-formoterol (SYMBICORT) 160-4.5 MCG/ACT inhaler Inhale 2 puffs into the lungs 2 (two) times daily.      Cholecalciferol (VITAMIN D3) 1000 UNITS CAPS Take 1 capsule by mouth daily.      Garlic 1000 MG CAPS Take 1 capsule by mouth daily.      HYDROcodone-acetaminophen (NORCO) 10-325 MG per tablet Take 1 tablet by mouth 4 (four) times daily as needed. pain     Multiple Vitamins-Minerals (CENTRUM) tablet Take 1 tablet by mouth daily.     omega-3 fish oil (MAXEPA) 1000 MG CAPS capsule Take 1 capsule by mouth daily.      omeprazole  (PRILOSEC) 20 MG capsule Take 20 mg by mouth 2 (two) times daily.      simvastatin (ZOCOR) 40 MG tablet Take 40 mg by mouth at bedtime.        STOP taking these medications     predniSONE (STERAPRED UNI-PAK) 10 MG tablet         Discharged Condition: Stable and improved.    Consults: None.  Significant Diagnostic Studies: Dg Chest Portable 1 View  04/21/2011  *RADIOLOGY REPORT*  Clinical Data: Short of breath.  Chest pain.  PORTABLE CHEST - 1 VIEW  Comparison: 11/11/2010.  Findings: Emphysema is present with the chronically increased interstitial markings.  There is no superimposed airspace disease. No pleural effusion.  Lung volumes slightly lower than on the prior exam. Monitoring leads are projected over the chest.  The cardiopericardial silhouette appears within normal limits. Saber sheath trachea is present.  IMPRESSION: Emphysema and chronic interstitial changes without acute cardiopulmonary disease.  Original Report Authenticated By: Andreas Newport, M.D.    Lab Results: Basic Metabolic Panel:  Basename 04/23/11 0450  NA 142  K 3.9  CL 97  CO2 41*  GLUCOSE 182*  BUN 15  CREATININE 0.66  CALCIUM 9.9  MG --  PHOS --   Liver Function Tests:  Basename 04/23/11 0450  AST 24  ALT 45  ALKPHOS 68  BILITOT  0.3  PROT 6.1  ALBUMIN 3.1*     CBC:  Basename 04/23/11 0450  WBC 14.9*  NEUTROABS --  HGB 15.1  HCT 46.7  MCV 96.1  PLT 195    Recent Results (from the past 240 hour(s))  MRSA PCR SCREENING     Status: Normal   Collection Time   04/22/11  2:27 AM      Component Value Range Status Comment   MRSA by PCR NEGATIVE  NEGATIVE  Final      Hospital Course: This 71 year old man was admitted with symptoms of dyspnea and wheezing for several days. He has had a cough but has not been able to expectorate much. He normally uses 2 L oxygen per minute at home for his COPD. When he came to the emergency room he apparently required BiPAP and soon after felt better. He  was admitted to the intensive care unit and progressively did better, albeit in a steady fashion. He was treated with intravenous steroids and antibiotics. Yesterday was converted to oral steroids. He still continues to wheeze but I think this is probably his baseline. He is requiring 3-4 L oxygen per minute but I suspect this can be turned further down without any major problems. He certainly does not have increased work of breathing at rest and he is not tachycardic at rest. He has been counseled again about tobacco abuse and he is committed to trying to quit smoking now.  Discharge Exam: Blood pressure 131/91, pulse 79, temperature 98.3 F (36.8 C), temperature source Oral, resp. rate 24, height 5\' 5"  (1.651 m), weight 77 kg (169 lb 12.1 oz), SpO2 95.00%. He looks systemically well. Is not tachycardic at rest. He does not have increased work of breathing. There is no peripheral central cyanosis. He is alert and orientated and able to talk in sentences. Lung fields show bilateral wheezing which I believe is his chronic state. There are no crackles, bronchial breathing. Heart sounds are present and normal.  Disposition: Home. He will have a tapering course of prednisone and a further 5 day course of Avelox. She will need to followup with his primary care physician as well as his pulmonologist in [place].  Discharge Orders    Future Orders Please Complete By Expires   Diet - low sodium heart healthy      Increase activity slowly           SignedWilson Singer Pager [phone]  04/25/2011, 7:27 AM

## 2011-04-25 NOTE — Progress Notes (Signed)
04/25/2011  Pt d/c home with friend and his son. Pt has home o2.spoke to pts son who requested home health servies . Arranged ahc to follow for rn, copd management.

## 2011-04-25 NOTE — Progress Notes (Signed)
Pt discharged to home with son today. Instructions given in reference to diet, activity, and medications, and infection prevention.  Pt expressed understanding. Prescriptions given for mucinex, prednisone, and avelox. I.V removed prior to discharge.

## 2011-04-25 NOTE — Plan of Care (Signed)
Problem: Phase I Progression Outcomes Goal: Dyspnea controlled at rest Outcome: Progressing Improved since admission, but anxiety level can become high requiring higher oxygen demand Goal: Pain controlled Outcome: Completed/Met Date Met:  04/25/11 No c/o pain Goal: Progress activity as tolerated unless otherwise ordered Outcome: Progressing Patient oob to void on his own, steady Goal: Discharge plan established Outcome: Progressing Possible d/c in am.  Son to pick up patient, transfer to home

## 2011-04-29 NOTE — Progress Notes (Signed)
Utilization review completed.  

## 2011-06-10 ENCOUNTER — Encounter (HOSPITAL_COMMUNITY): Payer: Self-pay | Admitting: *Deleted

## 2011-06-10 ENCOUNTER — Emergency Department (HOSPITAL_COMMUNITY)
Admission: EM | Admit: 2011-06-10 | Discharge: 2011-06-10 | Disposition: A | Discharge status: Home / Self Care | Payer: Medicare Other | Attending: Emergency Medicine | Admitting: Emergency Medicine

## 2011-06-10 DIAGNOSIS — IMO0002 Reserved for concepts with insufficient information to code with codable children: Secondary | ICD-10-CM

## 2011-06-10 DIAGNOSIS — F17200 Nicotine dependence, unspecified, uncomplicated: Secondary | ICD-10-CM | POA: Insufficient documentation

## 2011-06-10 DIAGNOSIS — S51809A Unspecified open wound of unspecified forearm, initial encounter: Secondary | ICD-10-CM | POA: Insufficient documentation

## 2011-06-10 DIAGNOSIS — M79609 Pain in unspecified limb: Secondary | ICD-10-CM | POA: Insufficient documentation

## 2011-06-10 DIAGNOSIS — Z79899 Other long term (current) drug therapy: Secondary | ICD-10-CM | POA: Insufficient documentation

## 2011-06-10 DIAGNOSIS — J449 Chronic obstructive pulmonary disease, unspecified: Secondary | ICD-10-CM | POA: Insufficient documentation

## 2011-06-10 DIAGNOSIS — Z7982 Long term (current) use of aspirin: Secondary | ICD-10-CM | POA: Insufficient documentation

## 2011-06-10 DIAGNOSIS — W1809XA Striking against other object with subsequent fall, initial encounter: Secondary | ICD-10-CM | POA: Insufficient documentation

## 2011-06-10 DIAGNOSIS — J4489 Other specified chronic obstructive pulmonary disease: Secondary | ICD-10-CM | POA: Insufficient documentation

## 2011-06-10 MED ORDER — BACITRACIN-NEOMYCIN-POLYMYXIN 400-5-5000 EX OINT: TOPICAL_OINTMENT | Freq: Once | CUTANEOUS | Status: DC

## 2011-06-10 MED ORDER — BACITRACIN ZINC 500 UNIT/GM EX OINT
TOPICAL_OINTMENT | CUTANEOUS | Status: AC
  Administered 2011-06-10: 15:00:00
  Filled 2011-06-10: qty 0.9

## 2011-06-10 NOTE — ED Notes (Signed)
Pt is on 02.  Fell on Friday 3am when getting out of bed to check on his wife.  Skin tear to rt upper arm and contusion to rt flank.  Concerned he may get infection in skin tear.  Jim Desanctis

## 2011-06-10 NOTE — Discharge Instructions (Signed)
Gently wash once daily with mild soap and water,  Then pat dry,  Recover with neosporin ointment and cover as directed,  Using a new layer of the (yellow) xeroform dressing,  Then cover with the cotton cling.  Get rechecked if this does not start healing or if you develop any signs of infection (redness,  Swelling, or drainage of pus from the wound).

## 2011-06-10 NOTE — ED Notes (Signed)
Fell Friday night on furniture at home, denies hitting head, noted dsg to right upper arm due to skin tear per pt., c/o pain to right abdomen, wants an antibiotic for skin tear

## 2011-06-12 NOTE — ED Provider Notes (Signed)
History     CSN: [id number]  Arrival date & time 06/10/11  1125   First MD Initiated Contact with Patient 06/10/11 1405      Chief Complaint  Patient presents with  . Fall  . Arm Pain    (Consider location/radiation/quality/duration/timing/severity/associated sxs/prior treatment) HPI Comments: Patient presents for evaluation of a skin tear he sustained after a fall 3 days ago.  He has been using dressings at home, which he states stick to the wound, and causes increased pain and bleeding when he changes his dressings.  He is also concerned he may be developing an infection at the site and would like an antibiotic for treatment.  Patient is a 71 y.o. male presenting with fall. The history is provided by the patient.  Fall Incident onset: 3 days ago. Incident: Patient was trying to assist an ill family member with ambulation when he fell landing again a piece of furniture, causing a deep abrasion to his right upper arm. He landed on carpet. The volume of blood lost was minimal. Point of impact: Right arm. Pain location: Right arm. The pain is at a severity of 5/10. The pain is moderate. He was ambulatory at the scene. Pertinent negatives include no fever, no numbness, no abdominal pain, no nausea, no headaches, no loss of consciousness and no tingling. Associated symptoms comments: He did not injure his head during this fall and denies any other pain symptoms.. Treatments tried: Dressings. The treatment provided mild relief.    Past Medical History  Diagnosis Date  . COPD (chronic obstructive pulmonary disease)   . Asthma     Past Surgical History  Procedure Date  . Fracture surgery     History reviewed. No pertinent family history.  History  Substance Use Topics  . Smoking status: Current Everyday Smoker -- 0.5 packs/day    Types: Cigarettes  . Smokeless tobacco: Never Used  . Alcohol Use: No      Review of Systems  Constitutional: Negative for fever.  HENT: Negative  for congestion, sore throat and neck pain.   Eyes: Negative.   Respiratory: Negative for chest tightness and shortness of breath.   Cardiovascular: Negative for chest pain.  Gastrointestinal: Negative for nausea and abdominal pain.  Genitourinary: Negative.   Musculoskeletal: Negative for joint swelling and arthralgias.  Skin: Positive for wound. Negative for rash.  Neurological: Negative for dizziness, tingling, loss of consciousness, weakness, light-headedness, numbness and headaches.  Hematological: Negative.   Psychiatric/Behavioral: Negative.     Allergies  Iohexol  Home Medications   Current Outpatient Rx  Name Route Sig Dispense Refill  . ALBUTEROL SULFATE (2.5 MG/3ML) 0.083% IN NEBU Nebulization Take 2.5 mg by nebulization every 6 (six) hours as needed. For shortness of breath     . IPRATROPIUM-ALBUTEROL 18-103 MCG/ACT IN AERO Inhalation Inhale 2 puffs into the lungs 4 (four) times daily.      Marland Kitchen ALPRAZOLAM 1 MG PO TABS Oral Take 1 mg by mouth 2 (two) times daily as needed. anxiety    . ASPIRIN EC 81 MG PO TBEC Oral Take 81 mg by mouth daily.      . BUDESONIDE-FORMOTEROL FUMARATE 160-4.5 MCG/ACT IN AERO Inhalation Inhale 2 puffs into the lungs 2 (two) times daily.      Marland Kitchen VITAMIN D3 1000 UNITS PO CAPS Oral Take 1 capsule by mouth daily.      Marland Kitchen GARLIC 1000 MG PO CAPS Oral Take 1 capsule by mouth daily.      . GUAIFENESIN  ER 600 MG PO TB12 Oral Take 2 tablets (1,200 mg total) by mouth 2 (two) times daily. 30 tablet 0  . HYDROCODONE-ACETAMINOPHEN 10-325 MG PO TABS Oral Take 1 tablet by mouth 4 (four) times daily as needed. pain     . MOXIFLOXACIN HCL 400 MG PO TABS Oral Take 1 tablet (400 mg total) by mouth daily. For 10 days started on 04-20-11 5 tablet 0  . CENTRUM PO TABS Oral Take 1 tablet by mouth daily.     . OMEGA-3 FISH OIL 1000 MG PO CAPS Oral Take 1 capsule by mouth daily.      Marland Kitchen OMEPRAZOLE 20 MG PO CPDR Oral Take 20 mg by mouth 2 (two) times daily.      Marland Kitchen PREDNISONE 20 MG  PO TABS  Take 2 tablets daily for 3 days, then 1 tablet daily for 3 days, then half tablet daily for 3 days, then STOP. 12 tablet 0  . SIMVASTATIN 40 MG PO TABS Oral Take 40 mg by mouth at bedtime.        BP 132/82  Pulse 78  Temp(Src) 97.8 F (36.6 C) (Oral)  Resp 20  Ht 5\' 5"  (1.651 m)  Wt 173 lb (78.472 kg)  BMI 28.79 kg/m2  SpO2 92%  Physical Exam  Nursing note and vitals reviewed. Constitutional: He is oriented to person, place, and time. He appears well-developed and well-nourished.  HENT:  Head: Normocephalic and atraumatic.  Eyes: Conjunctivae are normal.  Neck: Normal range of motion.  Cardiovascular: Normal rate and intact distal pulses.   Pulmonary/Chest: Effort normal. He has no wheezes.  Abdominal: He exhibits no distension.  Musculoskeletal: Normal range of motion.  Neurological: He is alert and oriented to person, place, and time.  Skin: Skin is warm and dry.       3 cm x 1 cm skin tear with no retained flap on right lower lateral arm.  Hemostatic.  No drainage or surrounding erythema suggestive of significant infection.  No induration present.  Psychiatric: He has a normal mood and affect.    ED Course  Procedures (including critical care time)  Labs Reviewed - No data to display No results found.   1. Skin tear     Wound was cleansed with wound cleanser spray, then bacitracin antibiotic ointment applied.  Xeroform was placed over the lesion site followed by Telfa and Kling wrap.  Patient advised to clean wound daily, reapplying a small strip of Xeroform given until the scab has formed, then he may leave open as desired.  Encouraged to follow up with PCP if not improved over the next week.  MDM          Candis Musa, PA 06/12/11 1120

## 2011-06-14 NOTE — ED Provider Notes (Signed)
Medical screening examination/treatment/procedure(s) were performed by non-physician practitioner and as supervising physician I was immediately available for consultation/collaboration.  Nicoletta Dress. Colon Branch, MD 06/14/11 1735

## 2011-07-04 ENCOUNTER — Encounter (HOSPITAL_COMMUNITY): Payer: Self-pay | Admitting: *Deleted

## 2011-07-04 ENCOUNTER — Emergency Department (HOSPITAL_COMMUNITY)
Admission: EM | Admit: 2011-07-04 | Discharge: 2011-07-04 | Disposition: A | Discharge status: Home / Self Care | Payer: Medicare Other | Attending: Emergency Medicine | Admitting: Emergency Medicine

## 2011-07-04 ENCOUNTER — Emergency Department (HOSPITAL_COMMUNITY): Payer: Medicare Other

## 2011-07-04 ENCOUNTER — Other Ambulatory Visit: Payer: Self-pay

## 2011-07-04 DIAGNOSIS — J449 Chronic obstructive pulmonary disease, unspecified: Secondary | ICD-10-CM

## 2011-07-04 DIAGNOSIS — J4489 Other specified chronic obstructive pulmonary disease: Secondary | ICD-10-CM | POA: Insufficient documentation

## 2011-07-04 DIAGNOSIS — R0602 Shortness of breath: Secondary | ICD-10-CM | POA: Insufficient documentation

## 2011-07-04 DIAGNOSIS — F17200 Nicotine dependence, unspecified, uncomplicated: Secondary | ICD-10-CM | POA: Insufficient documentation

## 2011-07-04 DIAGNOSIS — Z79899 Other long term (current) drug therapy: Secondary | ICD-10-CM | POA: Insufficient documentation

## 2011-07-04 LAB — CBC
HCT: 43 % (ref 39.0–52.0)
MCHC: 33.7 g/dL (ref 30.0–36.0)
MCV: 92.9 fL (ref 78.0–100.0)
Platelets: 239 10*3/uL (ref 150–400)
RDW: 15 % (ref 11.5–15.5)
WBC: 13 10*3/uL — ABNORMAL HIGH (ref 4.0–10.5)

## 2011-07-04 LAB — DIFFERENTIAL
Basophils Absolute: 0.1 10*3/uL (ref 0.0–0.1)
Basophils Relative: 0 % (ref 0–1)
Eosinophils Relative: 0 % (ref 0–5)
Lymphocytes Relative: 16 % (ref 12–46)
Monocytes Absolute: 1.9 10*3/uL — ABNORMAL HIGH (ref 0.1–1.0)
Neutro Abs: 9 10*3/uL — ABNORMAL HIGH (ref 1.7–7.7)

## 2011-07-04 LAB — BASIC METABOLIC PANEL
Calcium: 9.8 mg/dL (ref 8.4–10.5)
Creatinine, Ser: 0.69 mg/dL (ref 0.50–1.35)
GFR calc Af Amer: >90 mL/min (ref >90)
Sodium: 134 mEq/L — ABNORMAL LOW (ref 135–145)

## 2011-07-04 MED ORDER — SODIUM CHLORIDE 0.9 % IV SOLN
Freq: Once | INTRAVENOUS | Status: AC
  Administered 2011-07-04: 20:00:00 via INTRAVENOUS

## 2011-07-04 MED ORDER — OXYCODONE-ACETAMINOPHEN 5-325 MG PO TABS
1.0000 | ORAL_TABLET | Freq: Once | ORAL | Status: AC
  Administered 2011-07-04: 1 via ORAL
  Filled 2011-07-04: qty 1

## 2011-07-04 MED ORDER — ALBUTEROL SULFATE (5 MG/ML) 0.5% IN NEBU
5.0000 mg | INHALATION_SOLUTION | Freq: Once | RESPIRATORY_TRACT | Status: AC
  Administered 2011-07-04: 5 mg via RESPIRATORY_TRACT
  Filled 2011-07-04: qty 1

## 2011-07-04 MED ORDER — IPRATROPIUM BROMIDE 0.02 % IN SOLN
0.5000 mg | Freq: Once | RESPIRATORY_TRACT | Status: AC
  Administered 2011-07-04: 0.5 mg via RESPIRATORY_TRACT
  Filled 2011-07-04: qty 2.5

## 2011-07-04 MED ORDER — METHYLPREDNISOLONE SODIUM SUCC 125 MG IJ SOLR
125.0000 mg | Freq: Once | INTRAMUSCULAR | Status: AC
  Administered 2011-07-04: 125 mg via INTRAVENOUS
  Filled 2011-07-04: qty 2

## 2011-07-04 NOTE — Discharge Instructions (Signed)

## 2011-07-04 NOTE — ED Notes (Signed)
Pt reports he has over exerted himself over the past couple of days, reports tonight he is having increased sob with exertion, pt uses 2lpm 02 at all times

## 2011-07-04 NOTE — ED Notes (Signed)
Pt reports he has taken 2 breathing tx at home without relief

## 2011-07-04 NOTE — ED Provider Notes (Addendum)
History     CSN: [id number]  Arrival date & time 07/04/11  7829   First MD Initiated Contact with Patient 07/04/11 1911      Chief Complaint  Patient presents with  . Shortness of Breath    (Consider location/radiation/quality/duration/timing/severity/associated sxs/prior treatment) Patient is a 71 y.o. male presenting with shortness of breath. The history is provided by the patient.  Shortness of Breath  Associated symptoms include shortness of breath.   patient here short of breath x3 days. History of COPD and has been using his home nebulizers without improvement. Nonproductive cough without fever. No chest pain or chest pressure. Denies any vomiting or diarrhea. Patient does have transient relief with nebulizers and nothing makes symptoms worse. History of multiple exacerbations of COPD.  Past Medical History  Diagnosis Date  . COPD (chronic obstructive pulmonary disease)   . Asthma     Past Surgical History  Procedure Date  . Fracture surgery     No family history on file.  History  Substance Use Topics  . Smoking status: Current Everyday Smoker -- 0.5 packs/day    Types: Cigarettes  . Smokeless tobacco: Never Used  . Alcohol Use: No      Review of Systems  Respiratory: Positive for shortness of breath.   All other systems reviewed and are negative.    Allergies  Iohexol  Home Medications   Current Outpatient Rx  Name Route Sig Dispense Refill  . ALBUTEROL SULFATE (2.5 MG/3ML) 0.083% IN NEBU Nebulization Take 2.5 mg by nebulization every 6 (six) hours as needed. For shortness of breath     . IPRATROPIUM-ALBUTEROL 18-103 MCG/ACT IN AERO Inhalation Inhale 2 puffs into the lungs 4 (four) times daily.      Marland Kitchen ALPRAZOLAM 1 MG PO TABS Oral Take 1 mg by mouth 2 (two) times daily as needed. anxiety    . ASPIRIN EC 81 MG PO TBEC Oral Take 81 mg by mouth daily.      . BUDESONIDE-FORMOTEROL FUMARATE 160-4.5 MCG/ACT IN AERO Inhalation Inhale 2 puffs into the lungs  2 (two) times daily.      Marland Kitchen VITAMIN D3 1000 UNITS PO CAPS Oral Take 1 capsule by mouth daily.      Marland Kitchen GARLIC 1000 MG PO CAPS Oral Take 1 capsule by mouth daily.      . GUAIFENESIN ER 600 MG PO TB12 Oral Take 2 tablets (1,200 mg total) by mouth 2 (two) times daily. 30 tablet 0  . HYDROCODONE-ACETAMINOPHEN 10-325 MG PO TABS Oral Take 1 tablet by mouth 4 (four) times daily as needed. pain     . MOXIFLOXACIN HCL 400 MG PO TABS Oral Take 1 tablet (400 mg total) by mouth daily. For 10 days started on 04-20-11 5 tablet 0  . CENTRUM PO TABS Oral Take 1 tablet by mouth daily.     . OMEGA-3 FISH OIL 1000 MG PO CAPS Oral Take 1 capsule by mouth daily.      Marland Kitchen OMEPRAZOLE 20 MG PO CPDR Oral Take 20 mg by mouth 2 (two) times daily.      Marland Kitchen PREDNISONE 20 MG PO TABS  Take 2 tablets daily for 3 days, then 1 tablet daily for 3 days, then half tablet daily for 3 days, then STOP. 12 tablet 0  . SIMVASTATIN 40 MG PO TABS Oral Take 40 mg by mouth at bedtime.        BP 136/72  Pulse 96  Temp(Src) 97.9 F (36.6 C) (Oral)  Resp  20  Ht 5\' 5"  (1.651 m)  Wt 168 lb (76.204 kg)  BMI 27.96 kg/m2  SpO2 94%  Physical Exam  Nursing note and vitals reviewed. Constitutional: He is oriented to person, place, and time. He appears well-developed and well-nourished.  Non-toxic appearance. No distress.  HENT:  Head: Normocephalic and atraumatic.  Eyes: Conjunctivae, EOM and lids are normal. Pupils are equal, round, and reactive to light.  Neck: Normal range of motion. Neck supple. No tracheal deviation present. No mass present.  Cardiovascular: Normal rate, regular rhythm and normal heart sounds.  Exam reveals no gallop.   No murmur heard. Pulmonary/Chest: No stridor. He is in respiratory distress. He has no decreased breath sounds. He has wheezes. He has no rhonchi. He has no rales.  Abdominal: Soft. Normal appearance and bowel sounds are normal. He exhibits no distension. There is no tenderness. There is no rebound and no CVA  tenderness.  Musculoskeletal: Normal range of motion. He exhibits no edema and no tenderness.  Neurological: He is alert and oriented to person, place, and time. He has normal strength. No cranial nerve deficit or sensory deficit. GCS eye subscore is 4. GCS verbal subscore is 5. GCS motor subscore is 6.  Skin: Skin is warm and dry. No abrasion and no rash noted.  Psychiatric: He has a normal mood and affect. His speech is normal and behavior is normal.    ED Course  Procedures (including critical care time)   Labs Reviewed  CBC  DIFFERENTIAL  BASIC METABOLIC PANEL   No results found.   No diagnosis found.    MDM  Patient given albuterol treatments here and Solu-Medrol. Patient will be admitted for COPD exacerbation   9:24 PM Patient seen by Dr. Orvan Falconer from the tract hospitalist service and patient now feels like he is at his baseline and does not want to be admitted to the hospital. He was given something for his headache. Lung exam repeated and lungs are much improved with only faint wheezing. Patient is chronically on 2 L of oxygen and we discharged home on that and he feels back to his baseline   Toy Baker, MD 07/04/11 2038  Toy Baker, MD 07/04/11 2125

## 2011-09-14 DIAGNOSIS — R911 Solitary pulmonary nodule: Secondary | ICD-10-CM

## 2011-09-14 HISTORY — DX: Solitary pulmonary nodule: R91.1

## 2011-09-28 ENCOUNTER — Emergency Department (HOSPITAL_COMMUNITY): Payer: Medicare Other

## 2011-09-28 ENCOUNTER — Encounter (HOSPITAL_COMMUNITY): Payer: Self-pay | Admitting: *Deleted

## 2011-09-28 ENCOUNTER — Inpatient Hospital Stay (HOSPITAL_COMMUNITY)
Admission: EM | Admit: 2011-09-28 | Discharge: 2011-10-02 | DRG: 669 | Disposition: A | Discharge status: Home / Self Care | Payer: Medicare Other | Attending: Internal Medicine | Admitting: Internal Medicine

## 2011-09-28 DIAGNOSIS — J4489 Other specified chronic obstructive pulmonary disease: Secondary | ICD-10-CM | POA: Diagnosis present

## 2011-09-28 DIAGNOSIS — Z7982 Long term (current) use of aspirin: Secondary | ICD-10-CM

## 2011-09-28 DIAGNOSIS — D494 Neoplasm of unspecified behavior of bladder: Secondary | ICD-10-CM

## 2011-09-28 DIAGNOSIS — F411 Generalized anxiety disorder: Secondary | ICD-10-CM | POA: Diagnosis present

## 2011-09-28 DIAGNOSIS — J9610 Chronic respiratory failure, unspecified whether with hypoxia or hypercapnia: Secondary | ICD-10-CM

## 2011-09-28 DIAGNOSIS — R310 Gross hematuria: Secondary | ICD-10-CM | POA: Diagnosis present

## 2011-09-28 DIAGNOSIS — M549 Dorsalgia, unspecified: Secondary | ICD-10-CM | POA: Diagnosis present

## 2011-09-28 DIAGNOSIS — J438 Other emphysema: Secondary | ICD-10-CM

## 2011-09-28 DIAGNOSIS — Z79899 Other long term (current) drug therapy: Secondary | ICD-10-CM

## 2011-09-28 DIAGNOSIS — M1990 Unspecified osteoarthritis, unspecified site: Secondary | ICD-10-CM | POA: Diagnosis present

## 2011-09-28 DIAGNOSIS — G8929 Other chronic pain: Secondary | ICD-10-CM | POA: Diagnosis present

## 2011-09-28 DIAGNOSIS — E785 Hyperlipidemia, unspecified: Secondary | ICD-10-CM | POA: Diagnosis present

## 2011-09-28 DIAGNOSIS — C679 Malignant neoplasm of bladder, unspecified: Secondary | ICD-10-CM

## 2011-09-28 DIAGNOSIS — D72829 Elevated white blood cell count, unspecified: Secondary | ICD-10-CM | POA: Diagnosis present

## 2011-09-28 DIAGNOSIS — J984 Other disorders of lung: Secondary | ICD-10-CM | POA: Diagnosis present

## 2011-09-28 DIAGNOSIS — F17200 Nicotine dependence, unspecified, uncomplicated: Secondary | ICD-10-CM | POA: Diagnosis present

## 2011-09-28 DIAGNOSIS — N400 Enlarged prostate without lower urinary tract symptoms: Secondary | ICD-10-CM | POA: Diagnosis present

## 2011-09-28 DIAGNOSIS — Z720 Tobacco use: Secondary | ICD-10-CM | POA: Diagnosis present

## 2011-09-28 DIAGNOSIS — R319 Hematuria, unspecified: Secondary | ICD-10-CM

## 2011-09-28 DIAGNOSIS — F1120 Opioid dependence, uncomplicated: Secondary | ICD-10-CM | POA: Diagnosis present

## 2011-09-28 DIAGNOSIS — J449 Chronic obstructive pulmonary disease, unspecified: Secondary | ICD-10-CM | POA: Diagnosis present

## 2011-09-28 HISTORY — DX: Pure hypercholesterolemia, unspecified: E78.00

## 2011-09-28 HISTORY — DX: Gastro-esophageal reflux disease without esophagitis: K21.9

## 2011-09-28 HISTORY — DX: Headache: R51

## 2011-09-28 HISTORY — DX: Tobacco use: Z72.0

## 2011-09-28 HISTORY — DX: Dorsalgia, unspecified: M54.9

## 2011-09-28 HISTORY — DX: Solitary pulmonary nodule: R91.1

## 2011-09-28 HISTORY — DX: Anxiety disorder, unspecified: F41.9

## 2011-09-28 HISTORY — DX: Neoplasm of unspecified behavior of bladder: D49.4

## 2011-09-28 HISTORY — DX: Other chronic pain: G89.29

## 2011-09-28 HISTORY — DX: Benign prostatic hyperplasia without lower urinary tract symptoms: N40.0

## 2011-09-28 LAB — BASIC METABOLIC PANEL
BUN: 10 mg/dL (ref 6–23)
CO2: 29 mEq/L (ref 19–32)
Calcium: 9.7 mg/dL (ref 8.4–10.5)
Creatinine, Ser: 0.68 mg/dL (ref 0.50–1.35)
Glucose, Bld: 99 mg/dL (ref 70–99)
Sodium: 135 mEq/L (ref 135–145)

## 2011-09-28 LAB — HEPATIC FUNCTION PANEL
Albumin: 3.9 g/dL (ref 3.5–5.2)
Alkaline Phosphatase: 79 U/L (ref 39–117)
Total Bilirubin: 0.2 mg/dL — ABNORMAL LOW (ref 0.3–1.2)

## 2011-09-28 LAB — PROTIME-INR: INR: 0.91 (ref 0.00–1.49)

## 2011-09-28 LAB — DIFFERENTIAL
Eosinophils Relative: 2 % (ref 0–5)
Lymphocytes Relative: 37 % (ref 12–46)
Lymphs Abs: 3.7 10*3/uL (ref 0.7–4.0)
Monocytes Absolute: 0.8 10*3/uL (ref 0.1–1.0)
Monocytes Relative: 9 % (ref 3–12)

## 2011-09-28 LAB — URINE MICROSCOPIC-ADD ON

## 2011-09-28 LAB — CBC
HCT: 44.4 % (ref 39.0–52.0)
Hemoglobin: 14.7 g/dL (ref 13.0–17.0)
MCV: 93.1 fL (ref 78.0–100.0)
RBC: 4.77 MIL/uL (ref 4.22–5.81)
WBC: 9.8 10*3/uL (ref 4.0–10.5)

## 2011-09-28 LAB — URINALYSIS, ROUTINE W REFLEX MICROSCOPIC
Ketones, ur: NEGATIVE mg/dL
Leukocytes, UA: NEGATIVE
Nitrite: NEGATIVE
Protein, ur: NEGATIVE mg/dL
Urobilinogen, UA: 0.2 mg/dL (ref 0.0–1.0)

## 2011-09-28 MED ORDER — DIPHENHYDRAMINE HCL 25 MG PO CAPS
50.0000 mg | ORAL_CAPSULE | Freq: Once | ORAL | Status: AC
  Administered 2011-09-28: 50 mg via ORAL
  Filled 2011-09-28: qty 2

## 2011-09-28 MED ORDER — HYDROMORPHONE HCL PF 1 MG/ML IJ SOLN
0.5000 mg | INTRAMUSCULAR | Status: DC | PRN
  Administered 2011-10-01 – 2011-10-02 (×4): 0.5 mg via INTRAVENOUS
  Filled 2011-09-28 (×4): qty 1

## 2011-09-28 MED ORDER — ALBUTEROL SULFATE (5 MG/ML) 0.5% IN NEBU: 2.5000 mg | INHALATION_SOLUTION | RESPIRATORY_TRACT | Status: DC | PRN

## 2011-09-28 MED ORDER — ALBUTEROL SULFATE (5 MG/ML) 0.5% IN NEBU
2.5000 mg | INHALATION_SOLUTION | RESPIRATORY_TRACT | Status: DC
  Administered 2011-09-28 – 2011-10-02 (×15): 2.5 mg via RESPIRATORY_TRACT
  Filled 2011-09-28 (×15): qty 0.5

## 2011-09-28 MED ORDER — OMEGA-3 FISH OIL 1000 MG PO CAPS: 1.0000 | ORAL_CAPSULE | Freq: Every day | ORAL | Status: DC

## 2011-09-28 MED ORDER — ACETAMINOPHEN 325 MG PO TABS: 650.0000 mg | ORAL_TABLET | ORAL | Status: DC | PRN

## 2011-09-28 MED ORDER — METHYLPREDNISOLONE SODIUM SUCC 125 MG IJ SOLR
125.0000 mg | Freq: Once | INTRAMUSCULAR | Status: DC
  Filled 2011-09-28: qty 2

## 2011-09-28 MED ORDER — ONDANSETRON HCL 4 MG PO TABS: 4.0000 mg | ORAL_TABLET | Freq: Four times a day (QID) | ORAL | Status: DC | PRN

## 2011-09-28 MED ORDER — BUDESONIDE-FORMOTEROL FUMARATE 160-4.5 MCG/ACT IN AERO
2.0000 | INHALATION_SPRAY | Freq: Two times a day (BID) | RESPIRATORY_TRACT | Status: DC
  Administered 2011-09-29 – 2011-10-02 (×7): 2 via RESPIRATORY_TRACT
  Filled 2011-09-28: qty 6

## 2011-09-28 MED ORDER — POTASSIUM CHLORIDE IN NACL 20-0.9 MEQ/L-% IV SOLN
INTRAVENOUS | Status: DC
  Administered 2011-09-28 – 2011-09-29 (×2): via INTRAVENOUS
  Administered 2011-09-30: 1000 mL via INTRAVENOUS

## 2011-09-28 MED ORDER — BISACODYL 5 MG PO TBEC: 5.0000 mg | DELAYED_RELEASE_TABLET | Freq: Every day | ORAL | Status: DC | PRN

## 2011-09-28 MED ORDER — TRAZODONE HCL 50 MG PO TABS: 50.0000 mg | ORAL_TABLET | Freq: Every evening | ORAL | Status: DC | PRN

## 2011-09-28 MED ORDER — ACETAMINOPHEN 650 MG RE SUPP: 650.0000 mg | Freq: Four times a day (QID) | RECTAL | Status: DC | PRN

## 2011-09-28 MED ORDER — BIOTENE DRY MOUTH MT LIQD
15.0000 mL | Freq: Two times a day (BID) | OROMUCOSAL | Status: DC
Start: 2011-09-29 — End: 2011-10-02
  Administered 2011-09-29 – 2011-10-02 (×7): 15 mL via OROMUCOSAL

## 2011-09-28 MED ORDER — OXYCODONE HCL 5 MG PO TABS
5.0000 mg | ORAL_TABLET | ORAL | Status: DC | PRN
  Administered 2011-09-29: 5 mg via ORAL
  Filled 2011-09-28: qty 1

## 2011-09-28 MED ORDER — DIPHENHYDRAMINE HCL 25 MG PO CAPS: 50.0000 mg | ORAL_CAPSULE | Freq: Once | ORAL | Status: DC

## 2011-09-28 MED ORDER — ONDANSETRON HCL 4 MG/2ML IJ SOLN: 4.0000 mg | Freq: Four times a day (QID) | INTRAMUSCULAR | Status: DC | PRN

## 2011-09-28 MED ORDER — PANTOPRAZOLE SODIUM 40 MG PO TBEC
40.0000 mg | DELAYED_RELEASE_TABLET | Freq: Two times a day (BID) | ORAL | Status: DC
  Administered 2011-09-29 – 2011-10-02 (×7): 40 mg via ORAL
  Filled 2011-09-28 (×8): qty 1

## 2011-09-28 MED ORDER — IPRATROPIUM BROMIDE 0.02 % IN SOLN
0.5000 mg | RESPIRATORY_TRACT | Status: DC | PRN
  Filled 2011-09-28: qty 2.5

## 2011-09-28 MED ORDER — OMEGA-3-ACID ETHYL ESTERS 1 G PO CAPS
1.0000 g | ORAL_CAPSULE | Freq: Every day | ORAL | Status: DC
  Administered 2011-09-29 – 2011-10-02 (×4): 1 g via ORAL
  Filled 2011-09-28 (×4): qty 1

## 2011-09-28 MED ORDER — IPRATROPIUM BROMIDE 0.02 % IN SOLN
0.5000 mg | RESPIRATORY_TRACT | Status: DC
  Administered 2011-09-28 – 2011-10-02 (×15): 0.5 mg via RESPIRATORY_TRACT
  Filled 2011-09-28 (×14): qty 2.5

## 2011-09-28 MED ORDER — SIMVASTATIN 20 MG PO TABS
40.0000 mg | ORAL_TABLET | Freq: Every day | ORAL | Status: DC
  Administered 2011-09-29 – 2011-10-01 (×3): 40 mg via ORAL
  Filled 2011-09-28 (×3): qty 2

## 2011-09-28 MED ORDER — PREDNISONE 10 MG PO TABS: 50.0000 mg | ORAL_TABLET | Freq: Four times a day (QID) | ORAL | Status: DC

## 2011-09-28 MED ORDER — FLEET ENEMA 7-19 GM/118ML RE ENEM: 1.0000 | ENEMA | Freq: Once | RECTAL | Status: AC | PRN

## 2011-09-28 NOTE — ED Notes (Signed)
States he has been passing blood in his urine for over a week, states he has been taking antibiotics without relief
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
## 2011-09-28 NOTE — ED Notes (Signed)
Patient refused IV contrast due to allergic reaction. Patient agreed to have it with premedication after explained. Radiology tech had told EDP patient refused, CT changed. Patient given option of oral contrast or IV contrast. Patient to have oral contrast instead. Solu-medrol wasted. Benadryl given prior to CT being changed.

## 2011-09-28 NOTE — ED Provider Notes (Signed)
History     CSN: [id number]  Arrival date & time 09/28/11  1417   First MD Initiated Contact with Patient 09/28/11 1427      Chief Complaint  Patient presents with  . Hematuria    (Consider location/radiation/quality/duration/timing/severity/associated sxs/prior treatment) HPI......Marland Kitchenhematuria for 2 weeks.  Seen by primary care dr.  Malena Peer "antibiotc".  Not helping.  No fever, sweat, chills, weight loss.  Nothing makes sxs better/worse. sxs are moderate  Past Medical History  Diagnosis Date  . COPD (chronic obstructive pulmonary disease)   . Asthma     Past Surgical History  Procedure Date  . Fracture surgery     No family history on file.  History  Substance Use Topics  . Smoking status: Current Everyday Smoker -- 0.5 packs/day    Types: Cigarettes  . Smokeless tobacco: Never Used  . Alcohol Use: No      Review of Systems  All other systems reviewed and are negative.    Allergies  Iohexol  Home Medications   Current Outpatient Rx  Name Route Sig Dispense Refill  . ALBUTEROL SULFATE (2.5 MG/3ML) 0.083% IN NEBU Nebulization Take 2.5 mg by nebulization every 6 (six) hours as needed. For shortness of breath     . IPRATROPIUM-ALBUTEROL 18-103 MCG/ACT IN AERO Inhalation Inhale 2 puffs into the lungs 4 (four) times daily.      Marland Kitchen ALPRAZOLAM 1 MG PO TABS Oral Take 1 mg by mouth 2 (two) times daily as needed. anxiety    . ASPIRIN EC 81 MG PO TBEC Oral Take 81 mg by mouth daily.      . BUDESONIDE-FORMOTEROL FUMARATE 160-4.5 MCG/ACT IN AERO Inhalation Inhale 2 puffs into the lungs 2 (two) times daily.      . CEPHALEXIN 500 MG PO CAPS Oral Take 500 mg by mouth 2 (two) times daily.    Marland Kitchen VITAMIN D3 1000 UNITS PO CAPS Oral Take 1 capsule by mouth daily.      Marland Kitchen GARLIC 1000 MG PO CAPS Oral Take 1 capsule by mouth daily.     . CENTRUM PO TABS Oral Take 1 tablet by mouth daily.     . OMEGA-3 FISH OIL 1000 MG PO CAPS Oral Take 1 capsule by mouth daily.      Marland Kitchen OMEPRAZOLE 20 MG PO  CPDR Oral Take 20 mg by mouth 2 (two) times daily.      Marland Kitchen SIMVASTATIN 40 MG PO TABS Oral Take 40 mg by mouth daily.       BP 140/79  Pulse 81  Temp 97.9 F (36.6 C) (Oral)  Resp 16  Ht 5\' 5"  (1.651 m)  Wt 170 lb (77.111 kg)  BMI 28.29 kg/m2  SpO2 98%  Physical Exam  Nursing note and vitals reviewed. Constitutional: He is oriented to person, place, and time. He appears well-developed and well-nourished.  HENT:  Head: Normocephalic and atraumatic.  Eyes: Conjunctivae and EOM are normal. Pupils are equal, round, and reactive to light.  Neck: Normal range of motion. Neck supple.  Cardiovascular: Normal rate and regular rhythm.   Pulmonary/Chest: Effort normal and breath sounds normal.  Abdominal: Soft. Bowel sounds are normal.       No suprapubic tenderness  Genitourinary:       No flank tenderness  Musculoskeletal: Normal range of motion.  Neurological: He is alert and oriented to person, place, and time.  Skin: Skin is warm and dry.  Psychiatric: He has a normal mood and affect.    ED  Course  Procedures (including critical care time)  Labs Reviewed  URINALYSIS, ROUTINE W REFLEX MICROSCOPIC - Abnormal; Notable for the following:    APPearance HAZY (*)     Specific Gravity, Urine <1.005 (*)     Hgb urine dipstick LARGE (*)     All other components within normal limits  URINE MICROSCOPIC-ADD ON  CBC  DIFFERENTIAL  BASIC METABOLIC PANEL   Ct Abdomen Pelvis Wo Contrast  09/28/2011  *RADIOLOGY REPORT*  Clinical Data: Hematuria for 2 weeks.  CT ABDOMEN AND PELVIS WITHOUT CONTRAST  Technique:  Multidetector CT imaging of the abdomen and pelvis was performed following the standard protocol without intravenous contrast.  Comparison: PET CT scan dated 01/09/2010  Findings: There is an irregular 2 cm mass in the right posterior lateral aspect of the bladder consistent with carcinoma.  There is diffuse chronic thickening of the bladder wall.  The prostate gland is enlarged.  There is  no visible abdominal or pelvic adenopathy.  The patient has chronic obstructive changes at the lung bases. Small nodule in the right middle lobe is stable since the prior PET CT scan.  The liver, spleen, pancreas, adrenal glands, and kidneys are normal.  The bowel is normal including the terminal ileum and appendix.  No diverticular disease.  No significant osseous abnormality.  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  Original Report Authenticated By: Gwynn Burly, M.D.     1. Hematuria       MDM  Ct scan suggests bladder ca.  Discussed c Dr Jerre Simon.  Admit to Hospitalist.        Donnetta Hutching, MD 09/28/11 2018

## 2011-09-28 NOTE — ED Notes (Signed)
Pt c/o blood in his urine and burning with urination for 2 weeks ago, denies any pain at present, pt and family updated on plan of care.

## 2011-09-29 ENCOUNTER — Encounter (HOSPITAL_COMMUNITY): Payer: Self-pay | Admitting: Internal Medicine

## 2011-09-29 DIAGNOSIS — R310 Gross hematuria: Secondary | ICD-10-CM

## 2011-09-29 DIAGNOSIS — J9610 Chronic respiratory failure, unspecified whether with hypoxia or hypercapnia: Secondary | ICD-10-CM

## 2011-09-29 DIAGNOSIS — J438 Other emphysema: Secondary | ICD-10-CM

## 2011-09-29 DIAGNOSIS — Z720 Tobacco use: Secondary | ICD-10-CM

## 2011-09-29 DIAGNOSIS — C679 Malignant neoplasm of bladder, unspecified: Secondary | ICD-10-CM

## 2011-09-29 HISTORY — DX: Tobacco use: Z72.0

## 2011-09-29 LAB — BASIC METABOLIC PANEL
CO2: 30 mEq/L (ref 19–32)
Chloride: 100 mEq/L (ref 96–112)
Potassium: 3.9 mEq/L (ref 3.5–5.1)
Sodium: 137 mEq/L (ref 135–145)

## 2011-09-29 LAB — CBC
HCT: 47.2 % (ref 39.0–52.0)
Hemoglobin: 15.9 g/dL (ref 13.0–17.0)
MCV: 92.7 fL (ref 78.0–100.0)
RBC: 5.09 MIL/uL (ref 4.22–5.81)
WBC: 8.5 10*3/uL (ref 4.0–10.5)

## 2011-09-29 LAB — PSA: PSA: 1.84 ng/mL (ref <=4.00)

## 2011-09-29 MED ORDER — NICOTINE 21 MG/24HR TD PT24
21.0000 mg | MEDICATED_PATCH | Freq: Every day | TRANSDERMAL | Status: DC
  Administered 2011-09-29 – 2011-10-02 (×4): 21 mg via TRANSDERMAL
  Filled 2011-09-29 (×4): qty 1

## 2011-09-29 MED ORDER — HYDROCODONE-ACETAMINOPHEN 5-325 MG PO TABS
1.0000 | ORAL_TABLET | Freq: Two times a day (BID) | ORAL | Status: DC
  Administered 2011-09-29 – 2011-10-02 (×7): 1 via ORAL
  Filled 2011-09-29 (×8): qty 1

## 2011-09-29 MED ORDER — ALPRAZOLAM 1 MG PO TABS
1.0000 mg | ORAL_TABLET | Freq: Two times a day (BID) | ORAL | Status: DC
  Administered 2011-09-29 – 2011-10-02 (×7): 1 mg via ORAL
  Filled 2011-09-29 (×7): qty 1

## 2011-09-29 NOTE — Consult Note (Signed)
[phone]

## 2011-09-29 NOTE — Progress Notes (Signed)
Subjective: Patient says his urine is not bloody this morning. He has no complaints of abdominal pain. He does have chronic low back pain from arthritis and is requesting his hydrocodone. He is also requesting Xanax for anxiousness.  Objective: Vital signs in last 24 hours: Filed Vitals:   09/28/11 2300 09/28/11 2306 09/29/11 0442 09/29/11 0451  BP: 121/75  121/83   Pulse: 73  80   Temp: 97.9 F (36.6 C)  97.7 F (36.5 C)   TempSrc: Oral  Oral   Resp: 18  20   Height:  5\' 5"  (1.651 m)    Weight:  66.6 kg (146 lb 13.2 oz) 72.303 kg (159 lb 6.4 oz)   SpO2: 95%  94% 95%    Intake/Output Summary (Last 24 hours) at 09/29/11 0856 Last data filed at 09/29/11 0500  Gross per 24 hour  Intake    806 ml  Output    600 ml  Net    206 ml    Weight change:   General: Pleasant 71 year old Caucasian man sitting up in bed, in no acute distress. Lungs: Faint bilateral wheezes. Breathing nonlabored. Heart: S1, S2, with no murmurs rubs or gallops. Abdomen: Positive bowel sounds, soft, nontender, nondistended. Extremities: No pedal edema. Neurologic: He is alert and oriented x3. Cranial nerves II through XII are intact. Psychiatric: He is slightly anxious but pleasant. His speech is clear and not pressured.  Lab Results: Basic Metabolic Panel:  Basename 09/29/11 0553 09/28/11 1653  NA 137 135  K 3.9 3.9  CL 100 97  CO2 30 29  GLUCOSE 102* 99  BUN 7 10  CREATININE 0.64 0.68  CALCIUM 9.8 9.7  MG -- --  PHOS -- --   Liver Function Tests:  Basename 09/28/11 1653  AST 20  ALT 17  ALKPHOS 79  BILITOT 0.2*  PROT 7.0  ALBUMIN 3.9   No results found for this basename: LIPASE:2,AMYLASE:2 in the last 72 hours No results found for this basename: AMMONIA:2 in the last 72 hours CBC:  Basename 09/29/11 0553 09/28/11 1653  WBC 8.5 9.8  NEUTROABS -- 5.1  HGB 15.9 14.7  HCT 47.2 44.4  MCV 92.7 93.1  PLT 195 183   Cardiac Enzymes: No results found for this basename:  CKTOTAL:3,CKMB:3,CKMBINDEX:3,TROPONINI:3 in the last 72 hours BNP: No results found for this basename: PROBNP:3 in the last 72 hours D-Dimer: No results found for this basename: DDIMER:2 in the last 72 hours CBG: No results found for this basename: GLUCAP:6 in the last 72 hours Hemoglobin A1C: No results found for this basename: HGBA1C in the last 72 hours Fasting Lipid Panel: No results found for this basename: CHOL,HDL,LDLCALC,TRIG,CHOLHDL,LDLDIRECT in the last 72 hours Thyroid Function Tests: No results found for this basename: TSH,T4TOTAL,FREET4,T3FREE,THYROIDAB in the last 72 hours Anemia Panel: No results found for this basename: VITAMINB12,FOLATE,FERRITIN,TIBC,IRON,RETICCTPCT in the last 72 hours Coagulation:  Basename 09/28/11 1653  LABPROT 12.4  INR 0.91   Urine Drug Screen: Drugs of Abuse  No results found for this basename: labopia,  cocainscrnur,  labbenz,  amphetmu,  thcu,  labbarb    Alcohol Level: No results found for this basename: ETH:2 in the last 72 hours Urinalysis:  Basename 09/28/11 1441  COLORURINE YELLOW  LABSPEC <1.005*  PHURINE 6.5  GLUCOSEU NEGATIVE  HGBUR LARGE*  BILIRUBINUR NEGATIVE  KETONESUR NEGATIVE  PROTEINUR NEGATIVE  UROBILINOGEN 0.2  NITRITE NEGATIVE  LEUKOCYTESUR NEGATIVE   Misc. Labs:   Micro: No results found for this or any previous visit (  from the past 240 hour(s)).  Studies/Results: Ct Abdomen Pelvis Wo Contrast  09/28/2011  *RADIOLOGY REPORT*  Clinical Data: Hematuria for 2 weeks.  CT ABDOMEN AND PELVIS WITHOUT CONTRAST  Technique:  Multidetector CT imaging of the abdomen and pelvis was performed following the standard protocol without intravenous contrast.  Comparison: PET CT scan dated 01/09/2010  Findings: There is an irregular 2 cm mass in the right posterior lateral aspect of the bladder consistent with carcinoma.  There is diffuse chronic thickening of the bladder wall.  The prostate gland is enlarged.  There is no  visible abdominal or pelvic adenopathy.  The patient has chronic obstructive changes at the lung bases. Small nodule in the right middle lobe is stable since the prior PET CT scan.  The liver, spleen, pancreas, adrenal glands, and kidneys are normal.  The bowel is normal including the terminal ileum and appendix.  No diverticular disease.  No significant osseous abnormality.  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  Original Report Authenticated By: Gwynn Burly, M.D.    Medications:  Scheduled:    . albuterol  2.5 mg Nebulization Q4H WA  . ALPRAZolam  1 mg Oral BID  . antiseptic oral rinse  15 mL Mouth Rinse BID  . budesonide-formoterol  2 puff Inhalation BID  . diphenhydrAMINE  50 mg Oral Once  . HYDROcodone-acetaminophen  1 tablet Oral BID  . ipratropium  0.5 mg Nebulization Q4H WA  . methylPREDNISolone (SOLU-MEDROL) injection  125 mg Intravenous Once  . nicotine  21 mg Transdermal Daily  . omega-3 acid ethyl esters  1 g Oral Daily  . pantoprazole  40 mg Oral BID AC  . simvastatin  40 mg Oral Daily  . DISCONTD: diphenhydrAMINE  50 mg Oral Once  . DISCONTD: omega-3 fish oil  1 capsule Oral Daily  . DISCONTD: predniSONE  50 mg Oral Q6H   Continuous:    . 0.9 % NaCl with KCl 20 mEq / L 50 mL/hr at 09/28/11 2356   HQI:[id number], acetaminophen, albuterol, bisacodyl, HYDROmorphone (DILAUDID) injection, ipratropium, ondansetron (ZOFRAN) IV, ondansetron, oxyCODONE, sodium phosphate, traZODone  Assessment: Principal Problem:  *Bladder tumor Active Problems:  C O P D  PULMONARY NODULE  OSTEOARTHRITIS  Chronic respiratory failure  Gross hematuria  Tobacco abuse   1. Bladder tumor, consistent with malignancy. Urology has been consulted.  Gross hematuria. Secondary to #1. Per his account, his urine is clear this morning. I imagine his gross hematuria is intermittent.  Chronic hypoxic respiratory failure secondary to COPD.  We'll continue oxygen. We'll continue every 4 hour albuterol/Atrovent nebulizer and twice a day dosing of Symbicort.   Tobacco abuse. The patient was strongly advised to quit. Nicotine replacement therapy will be ordered.  Stable lung nodule.   Chronic low back pain/osteoarthritis. We'll continue hydrocodone.  Chronic anxiety. We'll continue Xanax.  Plan: 1. Await evaluation by Dr. Jerre Simon. 2. We'll add nicotine patch. Tobacco cessation counseling will be ordered. 3. Continue supportive treatment. 4. We'll hold off on further staging until after Dr. Rudean Haskell evaluation and potential biopsy of the bladder mass.     LOS: 1 day   Maudene Stotler 09/29/2011, 8:56 AM

## 2011-09-30 ENCOUNTER — Encounter (HOSPITAL_COMMUNITY): Payer: Self-pay | Admitting: *Deleted

## 2011-09-30 ENCOUNTER — Encounter (HOSPITAL_COMMUNITY)
Admission: EM | Disposition: A | Discharge status: Home / Self Care | Payer: Self-pay | Source: Home / Self Care | Attending: Internal Medicine

## 2011-09-30 DIAGNOSIS — C679 Malignant neoplasm of bladder, unspecified: Secondary | ICD-10-CM

## 2011-09-30 DIAGNOSIS — J438 Other emphysema: Secondary | ICD-10-CM

## 2011-09-30 DIAGNOSIS — J9610 Chronic respiratory failure, unspecified whether with hypoxia or hypercapnia: Secondary | ICD-10-CM

## 2011-09-30 DIAGNOSIS — R310 Gross hematuria: Secondary | ICD-10-CM

## 2011-09-30 HISTORY — PX: CYSTOSCOPY: SHX5120

## 2011-09-30 LAB — SURGICAL PCR SCREEN: Staphylococcus aureus: NEGATIVE

## 2011-09-30 SURGERY — CYSTOSCOPY, FLEXIBLE
Anesthesia: LOCAL | Site: Bladder | Wound class: Clean Contaminated

## 2011-09-30 MED ORDER — SODIUM CHLORIDE 0.9 % IR SOLN
Status: DC | PRN
  Administered 2011-09-30: 3000 mL

## 2011-09-30 MED ORDER — LIDOCAINE HCL 2 % EX GEL
CUTANEOUS | Status: DC | PRN
  Administered 2011-09-30: 1 via URETHRAL

## 2011-09-30 MED ORDER — STERILE WATER FOR IRRIGATION IR SOLN
Status: DC | PRN
  Administered 2011-09-30: 1000 mL

## 2011-09-30 MED ORDER — LIDOCAINE HCL (CARDIAC) 20 MG/ML IV SOLN
INTRAVENOUS | Status: AC
  Filled 2011-09-30: qty 5

## 2011-09-30 SURGICAL SUPPLY — 18 items
CLOTH BEACON ORANGE TIMEOUT ST (SAFETY) ×2 IMPLANT
DRAPE LAPAROTOMY TRNSV 102X78 (DRAPE) ×2 IMPLANT
DRAPE PROXIMA HALF (DRAPES) ×2 IMPLANT
GLOVE BIO SURGEON STRL SZ7 (GLOVE) ×2 IMPLANT
GLOVE BIOGEL PI IND STRL 7.0 (GLOVE) ×2 IMPLANT
GLOVE BIOGEL PI INDICATOR 7.0 (GLOVE) ×2
GLOVE EXAM NITRILE MD LF STRL (GLOVE) ×1 IMPLANT
GLOVE OPTIFIT SS 6.5 STRL BRWN (GLOVE) ×2 IMPLANT
GOWN STRL REIN XL XLG (GOWN DISPOSABLE) ×4 IMPLANT
IV NS IRRIG 3000ML ARTHROMATIC (IV SOLUTION) ×2 IMPLANT
MARKER SKIN DUAL TIP RULER LAB (MISCELLANEOUS) ×2 IMPLANT
SET IRRIG Y TYPE TUR BLADDER L (SET/KITS/TRAYS/PACK) ×2 IMPLANT
SPONGE GAUZE 4X4 12PLY (GAUZE/BANDAGES/DRESSINGS) ×2 IMPLANT
TOWEL OR 17X26 4PK STRL BLUE (TOWEL DISPOSABLE) ×2 IMPLANT
TRAY FOLEY CATH 14FR (SET/KITS/TRAYS/PACK) IMPLANT
VALVE DISPOSABLE (MISCELLANEOUS) ×2 IMPLANT
WATER STERILE IRR 1000ML POUR (IV SOLUTION) ×2 IMPLANT
YANKAUER SUCT BULB TIP 10FT TU (MISCELLANEOUS) ×2 IMPLANT

## 2011-09-30 NOTE — Progress Notes (Signed)
UR Chart Review Completed  

## 2011-09-30 NOTE — Progress Notes (Signed)
Subjective: Patient says his urine is not bloody this morning. No complaints of urinary hesitancy. He has no complaints of abdominal pain.  Objective: Vital signs in last 24 hours: Filed Vitals:   09/30/11 0530 09/30/11 0746 09/30/11 0749 09/30/11 1130  BP: 123/82     Pulse: 90     Temp: 98.2 F (36.8 C)     TempSrc: Oral     Resp: 22     Height:      Weight:      SpO2: 91% 94% 94% 95%    Intake/Output Summary (Last 24 hours) at 09/30/11 1219 Last data filed at 09/30/11 1005  Gross per 24 hour  Intake 2003.5 ml  Output    430 ml  Net 1573.5 ml    Weight change: -5.011 kg (-11 lb 0.8 oz)  General: Pleasant 71 year old Caucasian man sitting up in bed, in no acute distress. Lungs: Clear to auscultation. Heart: S1, S2, with no murmurs rubs or gallops. Abdomen: Positive bowel sounds, soft, nontender, nondistended. Extremities: No pedal edema.  Lab Results: Basic Metabolic Panel:  Basename 09/29/11 0553 09/28/11 1653  NA 137 135  K 3.9 3.9  CL 100 97  CO2 30 29  GLUCOSE 102* 99  BUN 7 10  CREATININE 0.64 0.68  CALCIUM 9.8 9.7  MG -- --  PHOS -- --   Liver Function Tests:  Basename 09/28/11 1653  AST 20  ALT 17  ALKPHOS 79  BILITOT 0.2*  PROT 7.0  ALBUMIN 3.9   No results found for this basename: LIPASE:2,AMYLASE:2 in the last 72 hours No results found for this basename: AMMONIA:2 in the last 72 hours CBC:  Basename 09/29/11 0553 09/28/11 1653  WBC 8.5 9.8  NEUTROABS -- 5.1  HGB 15.9 14.7  HCT 47.2 44.4  MCV 92.7 93.1  PLT 195 183   Cardiac Enzymes: No results found for this basename: CKTOTAL:3,CKMB:3,CKMBINDEX:3,TROPONINI:3 in the last 72 hours BNP: No results found for this basename: PROBNP:3 in the last 72 hours D-Dimer: No results found for this basename: DDIMER:2 in the last 72 hours CBG: No results found for this basename: GLUCAP:6 in the last 72 hours Hemoglobin A1C: No results found for this basename: HGBA1C in the last 72  hours Fasting Lipid Panel: No results found for this basename: CHOL,HDL,LDLCALC,TRIG,CHOLHDL,LDLDIRECT in the last 72 hours Thyroid Function Tests:  Basename 09/29/11 0553  TSH 0.837  T4TOTAL --  FREET4 --  T3FREE --  THYROIDAB --   Anemia Panel: No results found for this basename: VITAMINB12,FOLATE,FERRITIN,TIBC,IRON,RETICCTPCT in the last 72 hours Coagulation:  Basename 09/28/11 1653  LABPROT 12.4  INR 0.91   Urine Drug Screen: Drugs of Abuse  No results found for this basename: labopia,  cocainscrnur,  labbenz,  amphetmu,  thcu,  labbarb    Alcohol Level: No results found for this basename: ETH:2 in the last 72 hours Urinalysis:  Basename 09/28/11 1441  COLORURINE YELLOW  LABSPEC <1.005*  PHURINE 6.5  GLUCOSEU NEGATIVE  HGBUR LARGE*  BILIRUBINUR NEGATIVE  KETONESUR NEGATIVE  PROTEINUR NEGATIVE  UROBILINOGEN 0.2  NITRITE NEGATIVE  LEUKOCYTESUR NEGATIVE   Misc. Labs:   Micro: Recent Results (from the past 240 hour(s))  SURGICAL PCR SCREEN     Status: Normal   Collection Time   09/30/11 12:32 AM      Component Value Range Status Comment   MRSA, PCR NEGATIVE  NEGATIVE Final    Staphylococcus aureus NEGATIVE  NEGATIVE Final     Studies/Results: Ct Abdomen Pelvis Wo Contrast  09/28/2011  *RADIOLOGY REPORT*  Clinical Data: Hematuria for 2 weeks.  CT ABDOMEN AND PELVIS WITHOUT CONTRAST  Technique:  Multidetector CT imaging of the abdomen and pelvis was performed following the standard protocol without intravenous contrast.  Comparison: PET CT scan dated 01/09/2010  Findings: There is an irregular 2 cm mass in the right posterior lateral aspect of the bladder consistent with carcinoma.  There is diffuse chronic thickening of the bladder wall.  The prostate gland is enlarged.  There is no visible abdominal or pelvic adenopathy.  The patient has chronic obstructive changes at the lung bases. Small nodule in the right middle lobe is stable since the prior PET CT scan.   The liver, spleen, pancreas, adrenal glands, and kidneys are normal.  The bowel is normal including the terminal ileum and appendix.  No diverticular disease.  No significant osseous abnormality.  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  Original Report Authenticated By: Gwynn Burly, M.D.    Medications:  Scheduled:    . albuterol  2.5 mg Nebulization Q4H WA  . ALPRAZolam  1 mg Oral BID  . antiseptic oral rinse  15 mL Mouth Rinse BID  . budesonide-formoterol  2 puff Inhalation BID  . HYDROcodone-acetaminophen  1 tablet Oral BID  . ipratropium  0.5 mg Nebulization Q4H WA  . methylPREDNISolone (SOLU-MEDROL) injection  125 mg Intravenous Once  . nicotine  21 mg Transdermal Daily  . omega-3 acid ethyl esters  1 g Oral Daily  . pantoprazole  40 mg Oral BID AC  . simvastatin  40 mg Oral Daily   Continuous:    . 0.9 % NaCl with KCl 20 mEq / L 50 mL/hr at 09/29/11 2026   ZOX:[id number], acetaminophen, albuterol, bisacodyl, HYDROmorphone (DILAUDID) injection, ipratropium, ondansetron (ZOFRAN) IV, ondansetron, oxyCODONE, traZODone  Assessment: Principal Problem:  *Bladder tumor Active Problems:  C O P D  PULMONARY NODULE  OSTEOARTHRITIS  Chronic respiratory failure  Gross hematuria  Tobacco abuse   1. Bladder tumor, consistent with malignancy. Dr. Jerre Simon plans a cystoscopy today. Appreciate his assistance.  Gross hematuria. Secondary to #1. Per his account, his urine is clear this morning. I imagine his gross hematuria is intermittent.  Chronic hypoxic respiratory failure secondary to COPD. We'll continue oxygen. We'll continue every 4 hour albuterol/Atrovent nebulizer and twice a day dosing of Symbicort.   Tobacco abuse. The patient was strongly advised to quit. Nicotine replacement therapy will be ordered.  Stable lung nodule.   Chronic low back pain/osteoarthritis. We'll continue hydrocodone.  Chronic anxiety.  We'll continue Xanax.  Plan: 1. Await the results of the cystoscopy.     LOS: 2 days   Everson Mott 09/30/2011, 12:19 PM

## 2011-09-30 NOTE — Op Note (Signed)
NAMEKASYN, ROLPH NO.:  [id number]  MEDICAL RECORD NO.:  [id number]  LOCATION:  A319                          FACILITY:  [facility]  PHYSICIAN:  Ky Barban, M.D.DATE OF BIRTH:  05/20/1940  DATE OF PROCEDURE: DATE OF DISCHARGE:                              OPERATIVE REPORT   SURGEON:  Ky Barban, MD  PREOPERATIVE DIAGNOSES:  Gross hematuria.  Possible bladder tumor.  POSTOPERATIVE DIAGNOSES:  Bladder tumor.  Benign prostatic hypertrophy.  PROCEDURE:  Cystoscopy.  ANESTHESIA:  Local.  PROCEDURE:  The patient in supine position.  After usual prep and drape, Xylocaine jelly was instilled into the urethra after waiting adequate time.  Flexible cystoscope was introduced under direct vision.  Anterior urethra looks normal.  Prostatic urethra was completely obstructed with the lateral lobe hypertrophy.  The bladder was 2+ trabeculated.  There was a solid tumor located on the right bladder wall just on the area of the right intramural ureter.  The rest of the bladder grossly looks normal.  Cystoscope was removed.  The patient left the operating room in satisfactory condition.     Ky Barban, M.D.     MIJ/MEDQ  D:  09/30/2011  T:  09/30/2011  Job:  [id number]

## 2011-09-30 NOTE — Care Management Note (Signed)
    Page 1 of 1   10/02/2011     3:10:46 PM   CARE MANAGEMENT NOTE 10/02/2011  Patient:  [name] [name], [name] [name]   Account Number:  [id number]  Date Initiated:  09/30/2011  Documentation initiated by:  Rosemary Holms  Subjective/Objective Assessment:   Pt admitted from home where he lives with his wife. Admitted with blood in his urine and a bladder tumor.Per Pt he has home Ow.     Action/Plan:   Per patient and confirmed in notes, cysto is scheduled for today. Pt will handle getting O2 here for DC home. No needs identified from CM   Anticipated DC Date:  10/02/2011   Anticipated DC Plan:  HOME W HOME HEALTH SERVICES      DC Planning Services  CM consult      Choice offered to / List presented to:          [facility] arranged  HH-1 RN      [facility] agency  [facility]   Status of service:  Completed, signed off Medicare Important Message given?   (If response is "NO", the following Medicare IM given date fields will be blank) Date Medicare IM given:   Date Additional Medicare IM given:    Discharge Disposition:  HOME W HOME HEALTH SERVICES  Per UR Regulation:    If discussed at Long Length of Stay Meetings, dates discussed:    Comments:  10/02/11 1300 Venissa Nappi RN BSN CM Pt dc'd with [facility] for foley care.  10/01/11 1330 Annmarie Plemmons RN BSN Cm Pt having a procedure to remove a mass found during his cysto yesterday. CM to follow.  09/30/11 1100 [place] Leanord Hawking RN BSN CM

## 2011-09-30 NOTE — Brief Op Note (Signed)
09/28/2011 - 09/30/2011  2:05 PM  PATIENT:  [name] [name]  71 y.o. male  PRE-OPERATIVE DIAGNOSIS:  bladder tumor  POST-OPERATIVE DIAGNOSIS:  bladder tumor, benign prostatic hypertrophy  PROCEDURE:  Procedure(s) (LRB): CYSTOSCOPY FLEXIBLE (N/A)  SURGEON:  Surgeon(s) and Role:    * Ky Barban, MD - Primary  PHYSICIAN ASSISTANT:   ASSISTANTS: none   ANESTHESIA:   local  EBL:  Total I/O In: -  Out: 430 [Urine:300; Stool:130]  BLOOD ADMINISTERED:none  DRAINS: none   LOCAL MEDICATIONS USED:  XYLOCAINE   SPECIMEN:  No Specimen  DISPOSITION OF SPECIMEN:  N/A  COUNTS:  YES  TOURNIQUET:  * No tourniquets in log *  DICTATION: .Other Dictation: Dictation Number  [id number]  PLAN OF CARE: Admit to inpatient   PATIENT DISPOSITION:  PACU - hemodynamically stable.   Delay start of Pharmacological VTE agent (>24hrs) due to surgical blood loss or risk of bleeding:

## 2011-09-30 NOTE — Consult Note (Signed)
[name] [name], [name] [name]                ACCOUNT NO.:  [id number]  MEDICAL RECORD NO.:  [id number]  LOCATION:                                 FACILITY:  PHYSICIAN:  Ky Barban, M.D.DATE OF BIRTH:  [birth year]  DATE OF CONSULTATION:  09/29/2011 DATE OF DISCHARGE:                                CONSULTATION   CHIEF COMPLAINT:  Gross total painless hematuria.  HISTORY:  A 71 year old gentleman was passing blood in his urine for the last one week, now he has stopped passing any blood.  He has no voiding difficulty.  No fever or chills.  No history of similar problems in the past.  He has a history of having smoking 50 pack a year since the age of 80.  He went to see his primary care physician, who did some blood workup and told him it was normal, he was started on antibiotics.  When the bleeding was getting worse, so he came to the emergency room yesterday.  CT of abdomen and pelvis without contrast showed 2 cm bladder tumor suggested of carcinoma.  The patient is admitted for further management and workup.  PAST MEDICAL HISTORY:  He denies history of having diabetes or hypertension.  He does have COPD and bronchial asthma.  MEDICATION:  He takes medicines for his anxiety and also takes medicine for high cholesterol and acid reflux and takes 1 aspirin every day.  No other medications.  REVIEW OF SYSTEMS:  Unremarkable.  EXAMINATION:  GENERAL: Moderately built male, not in acute distress, fully conscious. VITAL SIGNS: On examination, blood pressure 140/79, temperature 97.9. ABDOMEN: Soft, flat.  Liver, spleen, kidneys not palpable. BACK: No CVA tenderness. GU: External genitalia is unremarkable.  Rectal examination is deferred. EXTREMITIES: Normal.  LAB DATA:  His sodium is 137, potassium 3.9, chloride 100, CO2 is 30. BUN is 7, creatinine 0.64.  WBC count is 8.5, hematocrit 47.2.  IMPRESSION:  Gross hematuria, possible bladder tumor.  Recommend cystoscopy.  We will  cystoscope him tomorrow under local anesthesia.  I discussed this with the patient.  He understands and wants me to go ahead and proceed with that.     Ky Barban, M.D.     MIJ/MEDQ  D:  09/29/2011  T:  09/29/2011  Job:  [id number]

## 2011-10-01 ENCOUNTER — Encounter (HOSPITAL_COMMUNITY)
Admission: EM | Disposition: A | Discharge status: Home / Self Care | Payer: Self-pay | Source: Home / Self Care | Attending: Internal Medicine

## 2011-10-01 ENCOUNTER — Encounter (HOSPITAL_COMMUNITY): Payer: Self-pay | Admitting: Internal Medicine

## 2011-10-01 ENCOUNTER — Observation Stay (HOSPITAL_COMMUNITY): Payer: Medicare Other | Admitting: Anesthesiology

## 2011-10-01 ENCOUNTER — Encounter (HOSPITAL_COMMUNITY): Payer: Self-pay | Admitting: Anesthesiology

## 2011-10-01 DIAGNOSIS — J9610 Chronic respiratory failure, unspecified whether with hypoxia or hypercapnia: Secondary | ICD-10-CM

## 2011-10-01 DIAGNOSIS — N400 Enlarged prostate without lower urinary tract symptoms: Secondary | ICD-10-CM

## 2011-10-01 DIAGNOSIS — C679 Malignant neoplasm of bladder, unspecified: Secondary | ICD-10-CM

## 2011-10-01 DIAGNOSIS — R310 Gross hematuria: Secondary | ICD-10-CM

## 2011-10-01 DIAGNOSIS — J438 Other emphysema: Secondary | ICD-10-CM

## 2011-10-01 HISTORY — PX: TRANSURETHRAL RESECTION OF BLADDER TUMOR: SHX2575

## 2011-10-01 HISTORY — DX: Benign prostatic hyperplasia without lower urinary tract symptoms: N40.0

## 2011-10-01 LAB — CBC
Hemoglobin: 14.8 g/dL (ref 13.0–17.0)
MCH: 31 pg (ref 26.0–34.0)
MCV: 92.5 fL (ref 78.0–100.0)
RBC: 4.77 MIL/uL (ref 4.22–5.81)

## 2011-10-01 LAB — BASIC METABOLIC PANEL
CO2: 28 mEq/L (ref 19–32)
GFR calc non Af Amer: >90 mL/min (ref >90)
Glucose, Bld: 97 mg/dL (ref 70–99)
Potassium: 3.7 mEq/L (ref 3.5–5.1)
Sodium: 141 mEq/L (ref 135–145)

## 2011-10-01 SURGERY — TURBT (TRANSURETHRAL RESECTION OF BLADDER TUMOR)
Anesthesia: General | Site: Bladder | Wound class: Clean Contaminated

## 2011-10-01 MED ORDER — ROCURONIUM BROMIDE 50 MG/5ML IV SOLN
INTRAVENOUS | Status: AC
  Filled 2011-10-01: qty 1

## 2011-10-01 MED ORDER — NEOSTIGMINE METHYLSULFATE 1 MG/ML IJ SOLN
INTRAMUSCULAR | Status: AC
  Filled 2011-10-01: qty 10

## 2011-10-01 MED ORDER — MORPHINE SULFATE 4 MG/ML IJ SOLN: 2.0000 mg | INTRAMUSCULAR | Status: DC | PRN

## 2011-10-01 MED ORDER — ONDANSETRON HCL 4 MG/2ML IJ SOLN: 4.0000 mg | Freq: Once | INTRAMUSCULAR | Status: DC | PRN

## 2011-10-01 MED ORDER — SUCCINYLCHOLINE CHLORIDE 20 MG/ML IJ SOLN
INTRAMUSCULAR | Status: DC | PRN
  Administered 2011-10-01: 140 mg via INTRAVENOUS

## 2011-10-01 MED ORDER — LACTATED RINGERS IV SOLN
INTRAVENOUS | Status: DC
  Administered 2011-10-01: 10:00:00 via INTRAVENOUS

## 2011-10-01 MED ORDER — SUCCINYLCHOLINE CHLORIDE 20 MG/ML IJ SOLN
INTRAMUSCULAR | Status: AC
  Filled 2011-10-01: qty 1

## 2011-10-01 MED ORDER — FENTANYL CITRATE 0.05 MG/ML IJ SOLN
25.0000 ug | INTRAMUSCULAR | Status: DC | PRN
  Administered 2011-10-01 (×2): 50 ug via INTRAVENOUS

## 2011-10-01 MED ORDER — STERILE WATER FOR IRRIGATION IR SOLN
Status: DC | PRN
  Administered 2011-10-01: 1000 mL

## 2011-10-01 MED ORDER — MIDAZOLAM HCL 2 MG/2ML IJ SOLN
INTRAMUSCULAR | Status: AC
  Administered 2011-10-01: 2 mg via INTRAVENOUS
  Filled 2011-10-01: qty 2

## 2011-10-01 MED ORDER — LIDOCAINE HCL 1 % IJ SOLN
INTRAMUSCULAR | Status: DC | PRN
  Administered 2011-10-01: 30 mg via INTRADERMAL

## 2011-10-01 MED ORDER — GLYCOPYRROLATE 0.2 MG/ML IJ SOLN
INTRAMUSCULAR | Status: DC | PRN
  Administered 2011-10-01: 0.2 mg via INTRAVENOUS

## 2011-10-01 MED ORDER — DEXTROSE-NACL 5-0.45 % IV SOLN
INTRAVENOUS | Status: DC
  Administered 2011-10-01: 1000 mL via INTRAVENOUS
  Administered 2011-10-01 – 2011-10-02 (×2): via INTRAVENOUS

## 2011-10-01 MED ORDER — ROCURONIUM BROMIDE 100 MG/10ML IV SOLN
INTRAVENOUS | Status: DC | PRN
  Administered 2011-10-01: 10 mg via INTRAVENOUS
  Administered 2011-10-01: 20 mg via INTRAVENOUS

## 2011-10-01 MED ORDER — GLYCOPYRROLATE 0.2 MG/ML IJ SOLN
INTRAMUSCULAR | Status: AC
  Filled 2011-10-01: qty 1

## 2011-10-01 MED ORDER — MIDAZOLAM HCL 5 MG/5ML IJ SOLN
INTRAMUSCULAR | Status: DC | PRN
  Administered 2011-10-01: 2 mg via INTRAVENOUS

## 2011-10-01 MED ORDER — PROPOFOL 10 MG/ML IV BOLUS
INTRAVENOUS | Status: DC | PRN
  Administered 2011-10-01: 150 mg via INTRAVENOUS

## 2011-10-01 MED ORDER — MIDAZOLAM HCL 2 MG/2ML IJ SOLN
1.0000 mg | INTRAMUSCULAR | Status: DC | PRN
  Administered 2011-10-01: 2 mg via INTRAVENOUS

## 2011-10-01 MED ORDER — LIDOCAINE HCL (PF) 1 % IJ SOLN
INTRAMUSCULAR | Status: AC
  Filled 2011-10-01: qty 5

## 2011-10-01 MED ORDER — FENTANYL CITRATE 0.05 MG/ML IJ SOLN
INTRAMUSCULAR | Status: DC | PRN
  Administered 2011-10-01 (×2): 50 ug via INTRAVENOUS

## 2011-10-01 MED ORDER — FENTANYL CITRATE 0.05 MG/ML IJ SOLN
INTRAMUSCULAR | Status: AC
  Administered 2011-10-01: 50 ug via INTRAVENOUS
  Filled 2011-10-01: qty 2

## 2011-10-01 MED ORDER — MIDAZOLAM HCL 2 MG/2ML IJ SOLN
INTRAMUSCULAR | Status: AC
  Filled 2011-10-01: qty 2

## 2011-10-01 MED ORDER — GLYCINE 1.5 % IR SOLN
Status: DC | PRN
  Administered 2011-10-01 (×5): 3000 mL

## 2011-10-01 MED ORDER — NEOSTIGMINE METHYLSULFATE 1 MG/ML IJ SOLN
INTRAMUSCULAR | Status: DC | PRN
  Administered 2011-10-01: 3 mg via INTRAVENOUS

## 2011-10-01 MED ORDER — PROPOFOL 10 MG/ML IV EMUL
INTRAVENOUS | Status: AC
  Filled 2011-10-01: qty 20

## 2011-10-01 MED ORDER — FENTANYL CITRATE 0.05 MG/ML IJ SOLN
INTRAMUSCULAR | Status: AC
  Filled 2011-10-01: qty 2

## 2011-10-01 SURGICAL SUPPLY — 30 items
BAG DECANTER FOR FLEXI CONT (MISCELLANEOUS) ×2 IMPLANT
BAG DRAIN URO TABLE W/ADPT NS (DRAPE) ×2 IMPLANT
BAG DRN 8 ADPR NS SKTRN CSTL (DRAPE) ×1
BAG DRN URN TUBE DRIP CHMBR (OSTOMY) ×1
BAG URINE DRAIN TURP 4L (OSTOMY) ×1 IMPLANT
BAG URINE DRAINAGE (UROLOGICAL SUPPLIES) ×2 IMPLANT
CABLE HI FREQUENCY MONOPOLAR (ELECTROSURGICAL) ×2 IMPLANT
CATH FOLEY 2WAY SLVR  5CC 20FR (CATHETERS)
CATH FOLEY 2WAY SLVR 5CC 20FR (CATHETERS) ×1 IMPLANT
CLOTH BEACON ORANGE TIMEOUT ST (SAFETY) ×2 IMPLANT
CONNECTOR 5 IN 1 STRAIGHT STRL (MISCELLANEOUS) ×2 IMPLANT
ELECT CUT LOOP C-MAX 27FR .012 (CUTTING LOOP) ×2
ELECTRODE CUT LP CMX 27FR .012 (CUTTING LOOP) ×1 IMPLANT
FORMALIN 10 PREFIL 120ML (MISCELLANEOUS) IMPLANT
GLOVE BIO SURGEON STRL SZ7 (GLOVE) ×2 IMPLANT
GLOVE INDICATOR 6.5 STRL GRN (GLOVE) ×1 IMPLANT
GLOVE SKINSENSE NS SZ7.0 (GLOVE) ×1
GLOVE SKINSENSE STRL SZ7.0 (GLOVE) IMPLANT
GLYCINE 1.5% IRRIG UROMATIC (IV SOLUTION) ×10 IMPLANT
GOWN STRL REIN XL XLG (GOWN DISPOSABLE) ×2 IMPLANT
IV NS IRRIG 3000ML ARTHROMATIC (IV SOLUTION) ×2 IMPLANT
KIT ROOM TURNOVER AP CYSTO (KITS) ×2 IMPLANT
MANIFOLD NEPTUNE II (INSTRUMENTS) ×2 IMPLANT
PACK CYSTO (CUSTOM PROCEDURE TRAY) ×2 IMPLANT
PAD ARMBOARD 7.5X6 YLW CONV (MISCELLANEOUS) ×2 IMPLANT
PAD TELFA 3X4 1S STER (GAUZE/BANDAGES/DRESSINGS) IMPLANT
SET IRRIGATING DISP (SET/KITS/TRAYS/PACK) ×2 IMPLANT
SYR 30ML LL (SYRINGE) ×2 IMPLANT
TOWEL OR 17X26 4PK STRL BLUE (TOWEL DISPOSABLE) ×2 IMPLANT
YANKAUER SUCT BULB TIP 10FT TU (MISCELLANEOUS) ×2 IMPLANT

## 2011-10-01 NOTE — Progress Notes (Signed)
Subjective: The patient is back from surgery. He has some bladder discomfort. He denies nausea and vomiting.  Objective: Vital signs in last 24 hours: Filed Vitals:   10/01/11 1215 10/01/11 1226 10/01/11 1240 10/01/11 1325  BP: 104/67 95/66 104/64 102/63  Pulse: 65 62 63 62  Temp:  98.3 F (36.8 C) 98 F (36.7 C) 97.4 F (36.3 C)  TempSrc:   Oral Oral  Resp: 18 18 18 18   Height:      Weight:      SpO2: 90% 92% 91% 93%    Intake/Output Summary (Last 24 hours) at 10/01/11 1445 Last data filed at 10/01/11 1145  Gross per 24 hour  Intake    700 ml  Output    300 ml  Net    400 ml    Weight change:   General: Pleasant 71 year old Caucasian man sitting up in bed, in no acute distress. Lungs: Clear to auscultation. Heart: S1, S2, with no murmurs rubs or gallops. Abdomen/suprapubic: Positive bowel sounds, soft, moderately tender over the bladder/bilateral lower quadrant. GU: catheter is in place. CBI started. Extremities: No pedal edema.  Lab Results: Basic Metabolic Panel:  Basename 10/01/11 0520 09/29/11 0553  NA 141 137  K 3.7 3.9  CL 105 100  CO2 28 30  GLUCOSE 97 102*  BUN 10 7  CREATININE 0.71 0.64  CALCIUM 9.6 9.8  MG -- --  PHOS -- --   Liver Function Tests:  Basename 09/28/11 1653  AST 20  ALT 17  ALKPHOS 79  BILITOT 0.2*  PROT 7.0  ALBUMIN 3.9   No results found for this basename: LIPASE:2,AMYLASE:2 in the last 72 hours No results found for this basename: AMMONIA:2 in the last 72 hours CBC:  Basename 10/01/11 0520 09/29/11 0553 09/28/11 1653  WBC 13.6* 8.5 --  NEUTROABS -- -- 5.1  HGB 14.8 15.9 --  HCT 44.1 47.2 --  MCV 92.5 92.7 --  PLT 190 195 --   Cardiac Enzymes: No results found for this basename: CKTOTAL:3,CKMB:3,CKMBINDEX:3,TROPONINI:3 in the last 72 hours BNP: No results found for this basename: PROBNP:3 in the last 72 hours D-Dimer: No results found for this basename: DDIMER:2 in the last 72 hours CBG: No results found for  this basename: GLUCAP:6 in the last 72 hours Hemoglobin A1C: No results found for this basename: HGBA1C in the last 72 hours Fasting Lipid Panel: No results found for this basename: CHOL,HDL,LDLCALC,TRIG,CHOLHDL,LDLDIRECT in the last 72 hours Thyroid Function Tests:  Basename 09/29/11 0553  TSH 0.837  T4TOTAL --  FREET4 --  T3FREE --  THYROIDAB --   Anemia Panel: No results found for this basename: VITAMINB12,FOLATE,FERRITIN,TIBC,IRON,RETICCTPCT in the last 72 hours Coagulation:  Basename 09/28/11 1653  LABPROT 12.4  INR 0.91   Urine Drug Screen: Drugs of Abuse  No results found for this basename: labopia,  cocainscrnur,  labbenz,  amphetmu,  thcu,  labbarb    Alcohol Level: No results found for this basename: ETH:2 in the last 72 hours Urinalysis: No results found for this basename: COLORURINE:2,APPERANCEUR:2,LABSPEC:2,PHURINE:2,GLUCOSEU:2,HGBUR:2,BILIRUBINUR:2,KETONESUR:2,PROTEINUR:2,UROBILINOGEN:2,NITRITE:2,LEUKOCYTESUR:2 in the last 72 hours Misc. Labs:   Micro: Recent Results (from the past 240 hour(s))  SURGICAL PCR SCREEN     Status: Normal   Collection Time   09/30/11 12:32 AM      Component Value Range Status Comment   MRSA, PCR NEGATIVE  NEGATIVE Final    Staphylococcus aureus NEGATIVE  NEGATIVE Final     Studies/Results: No results found.  Medications:  Scheduled:    .  albuterol  2.5 mg Nebulization Q4H WA  . ALPRAZolam  1 mg Oral BID  . antiseptic oral rinse  15 mL Mouth Rinse BID  . budesonide-formoterol  2 puff Inhalation BID  . fentaNYL      . glycopyrrolate      . HYDROcodone-acetaminophen  1 tablet Oral BID  . ipratropium  0.5 mg Nebulization Q4H WA  . lidocaine      . methylPREDNISolone (SOLU-MEDROL) injection  125 mg Intravenous Once  . midazolam      . neostigmine      . nicotine  21 mg Transdermal Daily  . omega-3 acid ethyl esters  1 g Oral Daily  . pantoprazole  40 mg Oral BID AC  . propofol      . rocuronium      . simvastatin   40 mg Oral Daily  . succinylcholine       Continuous:    . 0.9 % NaCl with KCl 20 mEq / L 1,000 mL (09/30/11 2224)  . dextrose 5 % and 0.45% NaCl 100 mL/hr at 10/01/11 1409  . DISCONTD: lactated ringers 75 mL/hr at 10/01/11 1014   ZOX:WRUEAVWUJWJXB, acetaminophen, albuterol, bisacodyl, HYDROmorphone (DILAUDID) injection, ipratropium, morphine injection, ondansetron (ZOFRAN) IV, ondansetron, traZODone, DISCONTD: fentaNYL, DISCONTD: glycine, DISCONTD: midazolam, DISCONTD: ondansetron (ZOFRAN) IV, DISCONTD: oxyCODONE, DISCONTD: sodium chloride irrigation, DISCONTD: sterile water for irrigation  Assessment: Principal Problem:  *Bladder tumor Active Problems:  C O P D  PULMONARY NODULE  OSTEOARTHRITIS  Chronic respiratory failure  Gross hematuria  Tobacco abuse  BPH (benign prostatic hyperplasia)   1. Bladder tumor, consistent with malignancy, but pathology pending. The patient is status post bladder resection today and status post cystoscopy yesterday.  BPH. As above.  Gross hematuria. Secondary to #1.   Chronic hypoxic respiratory failure secondary to COPD. We'll continue oxygen. We'll continue every 4 hour albuterol/Atrovent nebulizer and twice a day dosing of Symbicort.   Tobacco abuse. The patient was strongly advised to quit. We'll continue the nicotine patch.  Stable lung nodule.   Chronic low back pain/osteoarthritis. We'll continue hydrocodone.  Chronic anxiety. We'll continue Xanax.  Plan: 1. Continue pain management. 2. Await results of pathology on the bladder tumor. 3. Further recommendations per Dr. Jerre Simon.     LOS: 3 days   Jeevan Kalla 10/01/2011, 2:45 PM

## 2011-10-01 NOTE — Brief Op Note (Signed)
09/28/2011 - 10/01/2011  11:40 AM  PATIENT:  [name] [name]  72 y.o. male  PRE-OPERATIVE DIAGNOSIS:  bladder tumor  POST-OPERATIVE DIAGNOSIS:  bladder tumor  PROCEDURE:  Procedure(s) (LRB): TRANSURETHRAL RESECTION OF BLADDER TUMOR (TURBT) (N/A)  SURGEON:  Surgeon(s) and Role:    * Ky Barban, MD - Primary  PHYSICIAN ASSISTANT:   ASSISTANTS: none   ANESTHESIA:   general  EBL:  Total I/O In: 500 [I.V.:500] Out: 300 [Urine:300]  BLOOD ADMINISTERED:none  DRAINS: Urinary Catheter (Foley)   LOCAL MEDICATIONS USED:  NONE  SPECIMEN:  Source of Specimen:  bladder tumor  DISPOSITION OF SPECIMEN:  PATHOLOGY  COUNTS:  YES  TOURNIQUET:  * No tourniquets in log *  DICTATION: .Other Dictation: Dictation Number report#[id number]  PLAN OF CARE: Admit to inpatient   PATIENT DISPOSITION:  PACU - hemodynamically stable.   Delay start of Pharmacological VTE agent (>24hrs) due to surgical blood loss or risk of bleeding:

## 2011-10-01 NOTE — Anesthesia Preprocedure Evaluation (Signed)
Anesthesia Evaluation  Patient identified by MRN, date of birth, ID band Patient awake    Reviewed: Allergy & Precautions, H&P , NPO status , Patient's Chart, lab work & pertinent test results, reviewed documented beta blocker date and time   History of Anesthesia Complications Negative for: history of anesthetic complications  Airway Mallampati: I      Dental  (+) Edentulous Upper and Edentulous Lower   Pulmonary shortness of breath and Long-Term Oxygen Therapy, asthma , COPD   + decreased breath sounds      Cardiovascular hypertension, Pt. on medications Rhythm:Regular Rate:Normal     Neuro/Psych  Headaches,    GI/Hepatic GERD-  Medicated and Controlled,  Endo/Other    Renal/GU      Musculoskeletal   Abdominal   Peds  Hematology   Anesthesia Other Findings   Reproductive/Obstetrics                           Anesthesia Physical Anesthesia Plan  ASA: III  Anesthesia Plan: General   Post-op Pain Management:    Induction: Intravenous, Rapid sequence and Cricoid pressure planned  Airway Management Planned: Oral ETT  Additional Equipment:   Intra-op Plan:   Post-operative Plan: Extubation in OR  Informed Consent: I have reviewed the patients History and Physical, chart, labs and discussed the procedure including the risks, benefits and alternatives for the proposed anesthesia with the patient or authorized representative who has indicated his/her understanding and acceptance.     Plan Discussed with:   Anesthesia Plan Comments:         Anesthesia Quick Evaluation

## 2011-10-01 NOTE — Anesthesia Postprocedure Evaluation (Addendum)
  Anesthesia Post-op Note  Patient: [name] [name]  Procedure(s) Performed: Procedure(s) (LRB): TRANSURETHRAL RESECTION OF BLADDER TUMOR (TURBT) (N/A)  Patient Location: PACU  Anesthesia Type: General  Level of Consciousness: awake, alert , oriented and patient cooperative  Airway and Oxygen Therapy: Patient Spontanous Breathing  Post-op Pain: none  Post-op Assessment: Post-op Vital signs reviewed, Patient's Cardiovascular Status Stable, Respiratory Function Stable, Patent Airway, No signs of Nausea or vomiting and Pain level controlled  Post-op Vital Signs: Reviewed and stable  Complications: No apparent anesthesia complications  10/02/11  Post-op follow up-  Patient doing well.  Denies recall or PONV.  VSS.  No apparent anesthesia complications.

## 2011-10-01 NOTE — Anesthesia Procedure Notes (Signed)
Procedure Name: Intubation Date/Time: 10/01/2011 10:59 AM Performed by: Despina Hidden Pre-anesthesia Checklist: Emergency Drugs available, Suction available, Patient being monitored and Patient identified Patient Re-evaluated:Patient Re-evaluated prior to inductionOxygen Delivery Method: Circle system utilized Preoxygenation: Pre-oxygenation with 100% oxygen Intubation Type: IV induction, Cricoid Pressure applied and Rapid sequence Ventilation: Mask ventilation without difficulty Laryngoscope Size: Mac and 3 Grade View: Grade I Tube type: Oral Tube size: 8.0 mm Number of attempts: 1 Airway Equipment and Method: Stylet Placement Confirmation: ETT inserted through vocal cords under direct vision,  positive ETCO2 and breath sounds checked- equal and bilateral Secured at: 22 cm Tube secured with: Tape Dental Injury: Teeth and Oropharynx as per pre-operative assessment

## 2011-10-01 NOTE — Op Note (Signed)
[name] [name], [name] [name]                ACCOUNT NO.:  [id number]  MEDICAL RECORD NO.:  [id number]  LOCATION:  [facility]                          FACILITY:  [facility]  PHYSICIAN:  Ky Barban, M.D.DATE OF BIRTH:  29-Sep-1940  DATE OF PROCEDURE: DATE OF DISCHARGE:                              OPERATIVE REPORT   PREOPERATIVE DIAGNOSIS:  Bladder tumor.  POSTOPERATIVE DIAGNOSIS:  Bladder procedure.  PROCEDURE:  TUR, bladder tumor.  ANESTHESIA:  General.  PROCEDURE:  The patient under general endotracheal anesthesia in lithotomy position.  After usual prep and drape, a #28 Iglesias resectoscope was introduced into the bladder.  It was inspected again. Bladder has multiple spicules and it is 2 to 3+ trabeculated.  There is a solid and papillary tumor located on the right bladder wall just above the left ureteral orifice.  I proceeded to resect the tumor.  The tumor was resected completely toward the base of the tumor and overlying muscle was completely cleared off the tumor.  Chips were evacuated. Bleeders were coagulated.  At the end, there is no tumor which has been completely resected and after making sure all the chips were out, prostatic urethra was partially obstructed with lateral lobe hypertrophy of the prostate.  Cystoscope was removed.  A 22 three-way Foley catheter left in for drainage.  CBI is clear.  The patient left the operating room in satisfactory condition.     Ky Barban, M.D.     MIJ/MEDQ  D:  10/01/2011  T:  10/01/2011  Job:  [id number]

## 2011-10-01 NOTE — Transfer of Care (Signed)
Immediate Anesthesia Transfer of Care Note  Patient: [name] [name]  Procedure(s) Performed: Procedure(s) (LRB): TRANSURETHRAL RESECTION OF BLADDER TUMOR (TURBT) (N/A)  Patient Location: PACU  Anesthesia Type: General  Level of Consciousness: awake, alert , oriented and patient cooperative  Airway & Oxygen Therapy: Patient Spontanous Breathing and Patient connected to face mask oxygen  Post-op Assessment: Report given to PACU RN, Post -op Vital signs reviewed and stable and Patient moving all extremities  Post vital signs: Reviewed and stable  Complications: No apparent anesthesia complications

## 2011-10-02 ENCOUNTER — Encounter (HOSPITAL_COMMUNITY): Payer: Self-pay | Admitting: Internal Medicine

## 2011-10-02 DIAGNOSIS — F411 Generalized anxiety disorder: Secondary | ICD-10-CM

## 2011-10-02 LAB — BASIC METABOLIC PANEL
Calcium: 9.4 mg/dL (ref 8.4–10.5)
Creatinine, Ser: 0.69 mg/dL (ref 0.50–1.35)
GFR calc non Af Amer: >90 mL/min (ref >90)
Sodium: 139 mEq/L (ref 135–145)

## 2011-10-02 LAB — CBC
MCH: 30.7 pg (ref 26.0–34.0)
MCHC: 33 g/dL (ref 30.0–36.0)
MCV: 93.2 fL (ref 78.0–100.0)
Platelets: 187 10*3/uL (ref 150–400)

## 2011-10-02 MED ORDER — CIPROFLOXACIN HCL 500 MG PO TABS: 500.0000 mg | ORAL_TABLET | Freq: Two times a day (BID) | ORAL | Status: AC

## 2011-10-02 MED ORDER — HYDROCODONE-ACETAMINOPHEN 10-500 MG PO TABS: 1.0000 | ORAL_TABLET | Freq: Four times a day (QID) | ORAL | Status: AC | PRN

## 2011-10-02 MED ORDER — ASPIRIN EC 81 MG PO TBEC: 81.0000 mg | DELAYED_RELEASE_TABLET | Freq: Every day | ORAL | Status: DC

## 2011-10-02 MED ORDER — CIPROFLOXACIN HCL 250 MG PO TABS
500.0000 mg | ORAL_TABLET | Freq: Two times a day (BID) | ORAL | Status: DC
  Administered 2011-10-02: 500 mg via ORAL
  Filled 2011-10-02: qty 2

## 2011-10-02 NOTE — Discharge Summary (Signed)
Physician Discharge Summary  [name] [name] MRN: [id number] DOB/AGE: 1940-10-22 71 y.o.  PCP: No primary provider on file.   Admit date: 09/28/2011 Discharge date: 10/02/2011  Discharge Diagnoses:  1. Newly diagnosed high-grade urothelial carcinoma of the bladder.          Status post cystoscopy on 09/30/2011. Status post TUR, bladder tumor on 10/01/2011 by Dr. Jerre Simon. 2. Gross hematuria secondary to #1. 3. Chronic hypoxic respiratory failure secondary to severe COPD. 4. BPH. 5. Tobacco abuse. The patient was advised to stop smoking. 6. Chronic anxiety. Benzodiazepine dependent. 7. Chronic low back pain, opiate dependent.    Medication List  As of 10/02/2011  3:41 PM   STOP taking these medications         cephALEXin 500 MG capsule         TAKE these medications         albuterol (2.5 MG/3ML) 0.083% nebulizer solution   Commonly known as: PROVENTIL   Take 2.5 mg by nebulization every 6 (six) hours as needed. For shortness of breath      albuterol-ipratropium 18-103 MCG/ACT inhaler   Commonly known as: COMBIVENT   Inhale 2 puffs into the lungs 4 (four) times daily.      ALPRAZolam 1 MG tablet   Commonly known as: XANAX   Take 1 mg by mouth 2 (two) times daily as needed. anxiety      aspirin EC 81 MG tablet   Take 1 tablet (81 mg total) by mouth daily. Do not take aspirin until after you see Dr. Jerre Simon.      budesonide-formoterol 160-4.5 MCG/ACT inhaler   Commonly known as: SYMBICORT   Inhale 2 puffs into the lungs 2 (two) times daily.      CENTRUM tablet   Take 1 tablet by mouth daily.      ciprofloxacin 500 MG tablet   Commonly known as: CIPRO   Take 1 tablet (500 mg total) by mouth 2 (two) times daily. Antibiotic.      Garlic 1000 MG Caps   Take 1 capsule by mouth daily.      HYDROcodone-acetaminophen 10-500 MG per tablet   Commonly known as: LORTAB   Take 1 tablet by mouth every 6 (six) hours as needed for pain.      omega-3 fish oil 1000 MG Caps  capsule   Commonly known as: MAXEPA   Take 1 capsule by mouth daily.      omeprazole 20 MG capsule   Commonly known as: PRILOSEC   Take 20 mg by mouth 2 (two) times daily.      simvastatin 40 MG tablet   Commonly known as: ZOCOR   Take 40 mg by mouth daily.      Vitamin D3 1000 UNITS Caps   Take 1 capsule by mouth daily.            Discharge Condition: Improved.  Disposition: 01-Home or Self Care   Consults: Urologist, Dr. Jerre Simon.   Significant Diagnostic Studies: Ct Abdomen Pelvis Wo Contrast  09/28/2011  *RADIOLOGY REPORT*  Clinical Data: Hematuria for 2 weeks.  CT ABDOMEN AND PELVIS WITHOUT CONTRAST  Technique:  Multidetector CT imaging of the abdomen and pelvis was performed following the standard protocol without intravenous contrast.  Comparison: PET CT scan dated 01/09/2010  Findings: There is an irregular 2 cm mass in the right posterior lateral aspect of the bladder consistent with carcinoma.  There is diffuse chronic thickening of the bladder wall.  The  prostate gland is enlarged.  There is no visible abdominal or pelvic adenopathy.  The patient has chronic obstructive changes at the lung bases. Small nodule in the right middle lobe is stable since the prior PET CT scan.  The liver, spleen, pancreas, adrenal glands, and kidneys are normal.  The bowel is normal including the terminal ileum and appendix.  No diverticular disease.  No significant osseous abnormality.  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  Original Report Authenticated By: Gwynn Burly, M.D.     Microbiology: Recent Results (from the past 240 hour(s))  SURGICAL PCR SCREEN     Status: Normal   Collection Time   09/30/11 12:32 AM      Component Value Range Status Comment   MRSA, PCR NEGATIVE  NEGATIVE Final    Staphylococcus aureus NEGATIVE  NEGATIVE Final      Labs: Results for orders placed during the hospital encounter of 09/28/11 (from the  past 48 hour(s))  BASIC METABOLIC PANEL     Status: Normal   Collection Time   10/01/11  5:20 AM      Component Value Range Comment   Sodium 141  135 - 145 mEq/L    Potassium 3.7  3.5 - 5.1 mEq/L    Chloride 105  96 - 112 mEq/L    CO2 28  19 - 32 mEq/L    Glucose, Bld 97  70 - 99 mg/dL    BUN 10  6 - 23 mg/dL    Creatinine, Ser 1.61  0.50 - 1.35 mg/dL    Calcium 9.6  8.4 - 09.6 mg/dL    GFR calc non Af Amer >90  >90 mL/min    GFR calc Af Amer >90  >90 mL/min   CBC     Status: Abnormal   Collection Time   10/01/11  5:20 AM      Component Value Range Comment   WBC 13.6 (*) 4.0 - 10.5 K/uL    RBC 4.77  4.22 - 5.81 MIL/uL    Hemoglobin 14.8  13.0 - 17.0 g/dL    HCT 04.5  40.9 - 81.1 %    MCV 92.5  78.0 - 100.0 fL    MCH 31.0  26.0 - 34.0 pg    MCHC 33.6  30.0 - 36.0 g/dL    RDW 91.4  78.2 - 95.6 %    Platelets 190  150 - 400 K/uL   CBC     Status: Abnormal   Collection Time   10/02/11  5:16 AM      Component Value Range Comment   WBC 13.1 (*) 4.0 - 10.5 K/uL    RBC 4.82  4.22 - 5.81 MIL/uL    Hemoglobin 14.8  13.0 - 17.0 g/dL    HCT 21.3  08.6 - 57.8 %    MCV 93.2  78.0 - 100.0 fL    MCH 30.7  26.0 - 34.0 pg    MCHC 33.0  30.0 - 36.0 g/dL    RDW 46.9  62.9 - 52.8 %    Platelets 187  150 - 400 K/uL   BASIC METABOLIC PANEL     Status: Abnormal   Collection Time   10/02/11  5:16 AM      Component Value Range Comment   Sodium 139  135 - 145 mEq/L    Potassium 3.8  3.5 - 5.1 mEq/L    Chloride 104  96 - 112  mEq/L    CO2 28  19 - 32 mEq/L    Glucose, Bld 114 (*) 70 - 99 mg/dL    BUN 8  6 - 23 mg/dL    Creatinine, Ser 4.09  0.50 - 1.35 mg/dL    Calcium 9.4  8.4 - 81.1 mg/dL    GFR calc non Af Amer >90  >90 mL/min    GFR calc Af Amer >90  >90 mL/min      HPI : The patient is a 71 year old man with a history significant for tobacco abuse, hyperlipidemia, and chronic low back pain, who presented to the emergency department on 09/28/2011 with a chief complaint of blood in his  urine for 12 days. In the emergency department, he was hemodynamically stable and afebrile. His urinalysis revealed large hemoglobin and 21-50 RBCs. CT scan of his abdomen revealed a 2 cm irregular mass in the right posterior lateral aspect of the bladder consistent with carcinoma and prostatic enlargement. He was admitted for further evaluation and management.  HOSPITAL COURSE: The patient was started on supportive treatment and IV fluids for volume repletion. He had bronchospasms on exam. He was given IV Solu-Medrol in the emergency department. No further steroids were initiated. He was continued on bronchodilator therapy. A nicotine patch was placed. He was strongly advised to stop smoking. Shortly following hospital admission, gross hematuria completely resolved. Urologist, Dr. Jerre Simon was consulted. Subsequently, he performed a cystoscopy. The results revealed a bladder tumor and benign prostatic hypertrophy. The following day, Dr. Jerre Simon performed a TUR of the bladder tumor. He was started on continuous bladder irrigation for 24 hours. His urine did completely clear of blood, however, when he ambulated, it became slightly blood tinged before discharge.Marland Kitchen He had some discomfort over his bladder which was treated appropriately with analgesics. The pathology results did not become apparent until after the patient was discharged. The patient wanted the results called to him via telephone call when they became available.  He was started on Cipro empirically postoperatively. He was discharged to home in improved and stable condition. Although he had a mild leukocytosis, he was hemodynamically stable and afebrile.   Discharge Exam: Blood pressure 116/72, pulse 82, temperature 98 F (36.7 C), temperature source Oral, resp. rate 18, height 5\' 5"  (1.651 m), weight 73.029 kg (161 lb), SpO2 96.00%.  Lungs: Occasional wheezes, breathing nonlabored. Heart: S1, S2, with no murmurs rubs or gallops. Abdomen: Positive  bowel sounds, soft, nontender, nondistended. GU: Foley catheter inserted with no surrounding erythema or bloody discharge around the urethra. Mild blood-tinged urine in the Foley bag. Extremities: No pedal edema.   Discharge Orders    Future Orders Please Complete By Expires   Diet - low sodium heart healthy      Increase activity slowly      Discharge instructions      Comments:   Leave the Foley catheter in until you see Dr. Jerre Simon.      Follow-up Information    Follow up with [facility]. (RN for foley Care [phone])       Follow up with Ky Barban, MD on 10/07/2011. (At 11:30 am)    Contact information:   [address] [place] [address] [phone]         Total discharge time: 35 minutes.   Signed: Tarren Sabree 10/02/2011, 3:41 PM

## 2011-10-02 NOTE — Plan of Care (Signed)
Problem: Discharge Progression Outcomes Goal: Other Discharge Outcomes/Goals Outcome: Completed/Met Date Met:  10/02/11 Bladder irrigation discontinued Foley collecting clear light pink urine pt without C/O pain Discharge instructions read to pt I demonstrated conection of leg bag.  Written instructions give as well Pt and family member verbalized understanding  Discharged to home with family

## 2011-10-02 NOTE — Addendum Note (Signed)
Addendum  created 10/02/11 0901 by Moshe Salisbury, CRNA   Modules edited:Notes Section

## 2011-10-07 ENCOUNTER — Encounter (HOSPITAL_COMMUNITY): Payer: Self-pay | Admitting: Urology

## 2012-02-22 ENCOUNTER — Encounter (HOSPITAL_COMMUNITY): Payer: Self-pay | Admitting: *Deleted

## 2012-02-22 ENCOUNTER — Emergency Department (HOSPITAL_COMMUNITY)
Admission: EM | Admit: 2012-02-22 | Discharge: 2012-02-22 | Disposition: A | Discharge status: Home / Self Care | Payer: Medicare Other | Attending: Emergency Medicine | Admitting: Emergency Medicine

## 2012-02-22 DIAGNOSIS — Z8551 Personal history of malignant neoplasm of bladder: Secondary | ICD-10-CM | POA: Insufficient documentation

## 2012-02-22 DIAGNOSIS — E7800 Pure hypercholesterolemia, unspecified: Secondary | ICD-10-CM | POA: Insufficient documentation

## 2012-02-22 DIAGNOSIS — G8929 Other chronic pain: Secondary | ICD-10-CM | POA: Insufficient documentation

## 2012-02-22 DIAGNOSIS — B360 Pityriasis versicolor: Secondary | ICD-10-CM | POA: Insufficient documentation

## 2012-02-22 DIAGNOSIS — F411 Generalized anxiety disorder: Secondary | ICD-10-CM | POA: Insufficient documentation

## 2012-02-22 DIAGNOSIS — J4489 Other specified chronic obstructive pulmonary disease: Secondary | ICD-10-CM | POA: Insufficient documentation

## 2012-02-22 DIAGNOSIS — J45909 Unspecified asthma, uncomplicated: Secondary | ICD-10-CM | POA: Insufficient documentation

## 2012-02-22 DIAGNOSIS — K219 Gastro-esophageal reflux disease without esophagitis: Secondary | ICD-10-CM | POA: Insufficient documentation

## 2012-02-22 DIAGNOSIS — J449 Chronic obstructive pulmonary disease, unspecified: Secondary | ICD-10-CM | POA: Insufficient documentation

## 2012-02-22 DIAGNOSIS — Z87891 Personal history of nicotine dependence: Secondary | ICD-10-CM | POA: Insufficient documentation

## 2012-02-22 DIAGNOSIS — Z79899 Other long term (current) drug therapy: Secondary | ICD-10-CM | POA: Insufficient documentation

## 2012-02-22 MED ORDER — SELENIUM SULFIDE 2.5 % EX LOTN: TOPICAL_LOTION | Freq: Every day | CUTANEOUS | Status: DC

## 2012-02-22 NOTE — ED Notes (Signed)
Pt states he used boric acid two days ago without using a mask. States pain to his tongue with redness and rash to chest noticed yesterday. Pt denies pain or itching to area.

## 2012-02-22 NOTE — ED Provider Notes (Addendum)
History   This chart was scribed for [name] Lyons, MD, by Marcina Millard scribe. The patient was seen in room APA03/APA03 and the patient's care was started at 1811.    CSN: [id number]  Arrival date & time 02/22/12  1757   First MD Initiated Contact with Patient 02/22/12 1811      Chief Complaint  Patient presents with  . Allergic Reaction    (Consider location/radiation/quality/duration/timing/severity/associated sxs/prior treatment) HPI Comments: Ercil Gurkin [place] is a 71 y.o. male who presents to the Emergency Department complaining of constant, gradually worsening rash on his chest that began yesterday. He denies any associated itching, pain, fever, or SOB. He also reports pain to his tongue that began yesterday after using boric acid as an insecticide without a mask.       Patient is a 71 y.o. male presenting with allergic reaction.  Allergic Reaction    Past Medical History  Diagnosis Date  . COPD (chronic obstructive pulmonary disease)   . Asthma   . Tobacco abuse 09/29/2011  . Hypercholesteremia   . GERD (gastroesophageal reflux disease)   . Headache   . BPH (benign prostatic hyperplasia) 10/01/2011    Per cystoscopy  . 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  . Pulmonary nodule 09/2011    Stable appearance  . Chronic back pain   . Chronic anxiety     Past Surgical History  Procedure Date  . Cardiac catheterization 2003    [facility]  . Fracture surgery     R ring finger, pin placed  . Cystoscopy 09/30/2011    Procedure: CYSTOSCOPY FLEXIBLE;  Surgeon: Ky Barban, MD;  Location: [facility];  Service: Urology;  Laterality: N/A;  . Transurethral resection of bladder tumor 10/01/2011    Procedure: TRANSURETHRAL RESECTION OF BLADDER TUMOR (TURBT);  Surgeon: Ky Barban, MD;  Location: [facility];  Service: Urology;  Laterality: N/A;    Family History  Problem Relation Age of Onset  . Emphysema Brother     was not close to family; does not  truly know their medical problems.  . Emphysema Brother   . Emphysema Brother   . Cancer Neg Hx     History  Substance Use Topics  . Smoking status: Former Smoker -- 1.0 packs/day for 58 years    Types: Cigarettes    Quit date: 07/04/2011  . Smokeless tobacco: Former Neurosurgeon  . Alcohol Use: No      Review of Systems  All other systems reviewed and are negative.    Allergies  Iohexol  Home Medications   Current Outpatient Rx  Name  Route  Sig  Dispense  Refill  . ALBUTEROL SULFATE (2.5 MG/3ML) 0.083% IN NEBU   Nebulization   Take 2.5 mg by nebulization every 6 (six) hours as needed. For shortness of breath          . IPRATROPIUM-ALBUTEROL 18-103 MCG/ACT IN AERO   Inhalation   Inhale 2 puffs into the lungs 4 (four) times daily.           Marland Kitchen ALPRAZOLAM 1 MG PO TABS   Oral   Take 1 mg by mouth 2 (two) times daily as needed. anxiety         . ASPIRIN EC 81 MG PO TBEC   Oral   Take 1 tablet (81 mg total) by mouth daily. Do not take aspirin until after you see Dr. Jerre Simon.         . BUDESONIDE-FORMOTEROL FUMARATE  160-4.5 MCG/ACT IN AERO   Inhalation   Inhale 2 puffs into the lungs 2 (two) times daily.           Marland Kitchen VITAMIN D3 1000 UNITS PO CAPS   Oral   Take 1 capsule by mouth daily.           Marland Kitchen GARLIC 1000 MG PO CAPS   Oral   Take 1 capsule by mouth daily.          . CENTRUM PO TABS   Oral   Take 1 tablet by mouth daily.          . OMEGA-3 FISH OIL 1000 MG PO CAPS   Oral   Take 1 capsule by mouth daily.           Marland Kitchen OMEPRAZOLE 20 MG PO CPDR   Oral   Take 20 mg by mouth 2 (two) times daily.           Marland Kitchen SIMVASTATIN 40 MG PO TABS   Oral   Take 40 mg by mouth daily.            BP 147/93  Pulse 76  Temp 98 F (36.7 C) (Oral)  Resp 20  Ht 5\' 5"  (1.651 m)  Wt 163 lb (73.936 kg)  BMI 27.12 kg/m2  SpO2 99%  Physical Exam  Nursing note and vitals reviewed. Constitutional: He is oriented to person, place, and time. He appears  well-developed and well-nourished. No distress.  HENT:  Head: Normocephalic and atraumatic.  Neck: Normal range of motion. Neck supple.  Abdominal: Soft. Bowel sounds are normal.  Neurological: He is alert and oriented to person, place, and time.  Skin: He is not diaphoretic.       There are multiple areas of raised, pale lesions to the chest.     A complete 10 system review of systems was obtained and all systems are negative except as noted in the HPI and PMH.   ED Course  Procedures (including critical care time)  DIAGNOSTIC STUDIES: Oxygen Saturation is 99% on [place], normal by my interpretation.    COORDINATION OF CARE:  18:16- Discussed planned course of treatment with the patient, including selenium sulfide, who is agreeable at this time.    Labs Reviewed - No data to display No results found.   No diagnosis found.    MDM  This appears to be tinea versicolor.  I seriously doubt any relation to using boric acid.  Will treat with selenium sulfide shampoo, follow up prn.    I personally performed the services described in this documentation, which was scribed in my presence. The recorded information has been reviewed and is accurate.          [name] Lyons, MD 02/22/12 [id number]  [name] Lyons, MD 03/06/12 [phone]

## 2012-03-11 ENCOUNTER — Emergency Department (HOSPITAL_COMMUNITY): Payer: Medicare Other

## 2012-03-11 ENCOUNTER — Inpatient Hospital Stay (HOSPITAL_COMMUNITY)
Admission: EM | Admit: 2012-03-11 | Discharge: 2012-03-17 | DRG: 189 | Disposition: A | Discharge status: Home / Self Care | Payer: Medicare Other | Attending: Internal Medicine | Admitting: Internal Medicine

## 2012-03-11 DIAGNOSIS — Z9181 History of falling: Secondary | ICD-10-CM

## 2012-03-11 DIAGNOSIS — Z7982 Long term (current) use of aspirin: Secondary | ICD-10-CM

## 2012-03-11 DIAGNOSIS — R310 Gross hematuria: Secondary | ICD-10-CM

## 2012-03-11 DIAGNOSIS — F17200 Nicotine dependence, unspecified, uncomplicated: Secondary | ICD-10-CM | POA: Diagnosis present

## 2012-03-11 DIAGNOSIS — N400 Enlarged prostate without lower urinary tract symptoms: Secondary | ICD-10-CM | POA: Diagnosis present

## 2012-03-11 DIAGNOSIS — F411 Generalized anxiety disorder: Secondary | ICD-10-CM | POA: Diagnosis present

## 2012-03-11 DIAGNOSIS — E7800 Pure hypercholesterolemia, unspecified: Secondary | ICD-10-CM | POA: Diagnosis present

## 2012-03-11 DIAGNOSIS — F419 Anxiety disorder, unspecified: Secondary | ICD-10-CM | POA: Diagnosis present

## 2012-03-11 DIAGNOSIS — Z79899 Other long term (current) drug therapy: Secondary | ICD-10-CM

## 2012-03-11 DIAGNOSIS — Z720 Tobacco use: Secondary | ICD-10-CM | POA: Diagnosis present

## 2012-03-11 DIAGNOSIS — Z9981 Dependence on supplemental oxygen: Secondary | ICD-10-CM

## 2012-03-11 DIAGNOSIS — M1990 Unspecified osteoarthritis, unspecified site: Secondary | ICD-10-CM

## 2012-03-11 DIAGNOSIS — K219 Gastro-esophageal reflux disease without esophagitis: Secondary | ICD-10-CM | POA: Diagnosis present

## 2012-03-11 DIAGNOSIS — Z66 Do not resuscitate: Secondary | ICD-10-CM | POA: Diagnosis present

## 2012-03-11 DIAGNOSIS — E873 Alkalosis: Secondary | ICD-10-CM

## 2012-03-11 DIAGNOSIS — E669 Obesity, unspecified: Secondary | ICD-10-CM | POA: Diagnosis present

## 2012-03-11 DIAGNOSIS — Z6825 Body mass index (BMI) 25.0-25.9, adult: Secondary | ICD-10-CM

## 2012-03-11 DIAGNOSIS — E785 Hyperlipidemia, unspecified: Secondary | ICD-10-CM | POA: Diagnosis present

## 2012-03-11 DIAGNOSIS — I10 Essential (primary) hypertension: Secondary | ICD-10-CM

## 2012-03-11 DIAGNOSIS — J984 Other disorders of lung: Secondary | ICD-10-CM

## 2012-03-11 DIAGNOSIS — J9610 Chronic respiratory failure, unspecified whether with hypoxia or hypercapnia: Secondary | ICD-10-CM

## 2012-03-11 DIAGNOSIS — Z23 Encounter for immunization: Secondary | ICD-10-CM

## 2012-03-11 DIAGNOSIS — J441 Chronic obstructive pulmonary disease with (acute) exacerbation: Secondary | ICD-10-CM | POA: Diagnosis present

## 2012-03-11 DIAGNOSIS — J449 Chronic obstructive pulmonary disease, unspecified: Secondary | ICD-10-CM

## 2012-03-11 DIAGNOSIS — C679 Malignant neoplasm of bladder, unspecified: Secondary | ICD-10-CM | POA: Diagnosis present

## 2012-03-11 DIAGNOSIS — J45901 Unspecified asthma with (acute) exacerbation: Secondary | ICD-10-CM | POA: Diagnosis present

## 2012-03-11 DIAGNOSIS — J9620 Acute and chronic respiratory failure, unspecified whether with hypoxia or hypercapnia: Principal | ICD-10-CM | POA: Diagnosis present

## 2012-03-11 DIAGNOSIS — D494 Neoplasm of unspecified behavior of bladder: Secondary | ICD-10-CM

## 2012-03-11 HISTORY — DX: Hyperlipidemia, unspecified: E78.5

## 2012-03-11 HISTORY — DX: Anxiety disorder, unspecified: F41.9

## 2012-03-11 LAB — COMPREHENSIVE METABOLIC PANEL
ALT: 26 U/L (ref 0–53)
Alkaline Phosphatase: 75 U/L (ref 39–117)
BUN: 11 mg/dL (ref 6–23)
CO2: 36 mEq/L — ABNORMAL HIGH (ref 19–32)
GFR calc Af Amer: >90 mL/min (ref >90)
GFR calc non Af Amer: 85 mL/min — ABNORMAL LOW (ref >90)
Glucose, Bld: 121 mg/dL — ABNORMAL HIGH (ref 70–99)
Potassium: 4.1 mEq/L (ref 3.5–5.1)
Sodium: 140 mEq/L (ref 135–145)

## 2012-03-11 LAB — CBC WITH DIFFERENTIAL/PLATELET
Eosinophils Relative: 3 % (ref 0–5)
Hemoglobin: 13.9 g/dL (ref 13.0–17.0)
Lymphocytes Relative: 27 % (ref 12–46)
Lymphs Abs: 2.3 10*3/uL (ref 0.7–4.0)
MCV: 96.6 fL (ref 78.0–100.0)
Monocytes Relative: 5 % (ref 3–12)
Neutrophils Relative %: 65 % (ref 43–77)
Platelets: 152 10*3/uL (ref 150–400)
RBC: 4.42 MIL/uL (ref 4.22–5.81)
WBC: 8.5 10*3/uL (ref 4.0–10.5)

## 2012-03-11 MED ORDER — ALBUTEROL SULFATE (5 MG/ML) 0.5% IN NEBU
INHALATION_SOLUTION | RESPIRATORY_TRACT | Status: AC
  Filled 2012-03-11: qty 1

## 2012-03-11 MED ORDER — SODIUM CHLORIDE 0.9 % IV SOLN
INTRAVENOUS | Status: DC
  Administered 2012-03-11: 22:00:00 via INTRAVENOUS

## 2012-03-11 MED ORDER — ALBUTEROL (5 MG/ML) CONTINUOUS INHALATION SOLN
15.0000 mg/h | INHALATION_SOLUTION | Freq: Once | RESPIRATORY_TRACT | Status: AC
  Administered 2012-03-11: 15 mg/h via RESPIRATORY_TRACT
  Filled 2012-03-11: qty 20

## 2012-03-11 MED ORDER — ALBUTEROL SULFATE (5 MG/ML) 0.5% IN NEBU
5.0000 mg | INHALATION_SOLUTION | Freq: Once | RESPIRATORY_TRACT | Status: AC
  Administered 2012-03-11: 5 mg via RESPIRATORY_TRACT

## 2012-03-11 MED ORDER — IPRATROPIUM BROMIDE 0.02 % IN SOLN
0.5000 mg | Freq: Once | RESPIRATORY_TRACT | Status: AC
  Administered 2012-03-11: 0.5 mg via RESPIRATORY_TRACT

## 2012-03-11 MED ORDER — IPRATROPIUM BROMIDE 0.02 % IN SOLN
RESPIRATORY_TRACT | Status: AC
  Filled 2012-03-11: qty 2.5

## 2012-03-11 MED ORDER — IPRATROPIUM BROMIDE 0.02 % IN SOLN
0.5000 mg | Freq: Once | RESPIRATORY_TRACT | Status: AC
  Administered 2012-03-11: 0.5 mg via RESPIRATORY_TRACT
  Filled 2012-03-11: qty 2.5

## 2012-03-11 NOTE — ED Notes (Signed)
Pt doing well with BiPap in place, sats now at 98% on 40% FiO2.

## 2012-03-11 NOTE — ED Notes (Signed)
Placed on 15 lpm NRB o2 with improvement in 02 sats to 99%, decreased to 10 lpm via NRB with sats remaining 98%.

## 2012-03-11 NOTE — ED Notes (Signed)
Son at bedside states that the patient has been having increasing sleepiness at home, as well as weakness at times.  Son states that the patient fell while at home yesterday, states he did not appear to have any injuries and did not complain of anything later that day.  Pt makes no mention of this occurrence during assessment.  Son also states that he is the patients POA, but is unable to produce any documentation proving such.  Son states that the patient neglects himself to take care of his significant other.

## 2012-03-11 NOTE — ED Provider Notes (Signed)
History   This chart was scribed for [name], MD scribed by [name]. The patient was seen in room  at 22:02   CSN: [id number]  Arrival date & time     Chief Complaint  Patient presents with  . Shortness of Breath   Level 5 caveat for breathing problems.  (Consider location/radiation/quality/duration/timing/severity/associated sxs/prior treatment) Patient is a 71 y.o. male presenting with shortness of breath. The history is provided by the patient. No language interpreter was used.  Shortness of Breath  Associated symptoms include shortness of breath.  [name] [name] is a 71 y.o. male who presents to the Emergency Department via EMS complaining of constant SOB onset three weeks ago with associated wheezing, productive cough with yellow colored sputum, occasional CP at the left central and lower  chest, and occasional diarrhea. The patient also notes that he believes he had a fever, but did not take his temperature at home.   The patient reports that her wife has been sick at home and he has been taking care of her.  The patient says he uses a nebulizer at home, but explains it only is effective occasionally. He is reportedly on oxygen all the time, stating he uses between 2L-3L and that he will occasionally increase it at night. The patient was  last hospitalized in January 2013 for COPD.  The patient denies diaphoresis, nausea, and vomiting.  EMS gave patient 1 nebulizer of albuterol, one combivent and Solumedrol 125 mg IV en route to the ED.    PCP: Dr. Samuel Jester   Past Medical History  Diagnosis Date  . COPD (chronic obstructive pulmonary disease)   . Asthma   . Tobacco abuse 09/29/2011  . Hypercholesteremia   . GERD (gastroesophageal reflux disease)   . Headache   . BPH (benign prostatic hyperplasia) 10/01/2011    Per cystoscopy  . 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  . Pulmonary nodule 09/2011    Stable appearance  . Chronic back  pain   . Chronic anxiety     Past Surgical History  Procedure Date  . Cardiac catheterization 2003    [facility]  . Fracture surgery     R ring finger, pin placed  . Cystoscopy 09/30/2011    Procedure: CYSTOSCOPY FLEXIBLE;  Surgeon: Ky Barban, MD;  Location: [facility];  Service: Urology;  Laterality: N/A;  . Transurethral resection of bladder tumor 10/01/2011    Procedure: TRANSURETHRAL RESECTION OF BLADDER TUMOR (TURBT);  Surgeon: Ky Barban, MD;  Location: [facility];  Service: Urology;  Laterality: N/A;    Family History  Problem Relation Age of Onset  . Emphysema Brother     was not close to family; does not truly know their medical problems.  . Emphysema Brother   . Emphysema Brother   . Cancer Neg Hx     History  Substance Use Topics  . Smoking status: Former Smoker -- 1.0 packs/day for 58 years    Types: Cigarettes    Quit date: 07/04/2011  . Smokeless tobacco: Former Neurosurgeon  . Alcohol Use: No  The patient smokes 1-2 packs a day, but does not drink any alcohol.  Lives at home Lives with spouse Home oxygen 2.5-3 lpm [place] 24/7   Review of Systems  Respiratory: Positive for shortness of breath.   10 Systems reviewed and are negative for acute change except as noted in the HPI.  Allergies  Iohexol  Home Medications   Current  Outpatient Rx  Name  Route  Sig  Dispense  Refill  . ALBUTEROL SULFATE (2.5 MG/3ML) 0.083% IN NEBU   Nebulization   Take 2.5 mg by nebulization every 6 (six) hours as needed. For shortness of breath          . IPRATROPIUM-ALBUTEROL 18-103 MCG/ACT IN AERO   Inhalation   Inhale 2 puffs into the lungs 4 (four) times daily.           Marland Kitchen ALPRAZOLAM 1 MG PO TABS   Oral   Take 1 mg by mouth 2 (two) times daily as needed. anxiety         . ASPIRIN EC 81 MG PO TBEC   Oral   Take 1 tablet (81 mg total) by mouth daily. Do not take aspirin until after you see Dr. Jerre Simon.         . BUDESONIDE-FORMOTEROL FUMARATE 160-4.5 MCG/ACT IN AERO    Inhalation   Inhale 2 puffs into the lungs 2 (two) times daily.           Marland Kitchen VITAMIN D3 1000 UNITS PO CAPS   Oral   Take 1 capsule by mouth daily.           Marland Kitchen GARLIC 1000 MG PO CAPS   Oral   Take 1 capsule by mouth daily.          . CENTRUM PO TABS   Oral   Take 1 tablet by mouth daily.          . OMEGA-3 FISH OIL 1000 MG PO CAPS   Oral   Take 1 capsule by mouth daily.           Marland Kitchen OMEPRAZOLE 20 MG PO CPDR   Oral   Take 20 mg by mouth 2 (two) times daily.           . SELENIUM SULFIDE 2.5 % EX LOTN   Topical   Apply topically daily. Apply locally, leave on for one hour, then rinse off.  Repeat daily until gone.   118 mL   2   . SIMVASTATIN 40 MG PO TABS   Oral   Take 40 mg by mouth daily.            BP 111/75  Pulse 93  Temp 98.5 F (36.9 C) (Oral)  Resp 27  SpO2 100%  Vital signs normal except nurses report initial pulse ox was 78% en route air which is hypoxic   Physical Exam  Nursing note and vitals reviewed. Constitutional: He is oriented to person, place, and time. He appears well-developed and well-nourished. He appears distressed.       At time of exam patient had had 2 nebulizers by EMS and one in the ED.  HENT:  Head: Normocephalic and atraumatic.  Right Ear: External ear normal.  Left Ear: External ear normal.  Nose: Nose normal.  Mouth/Throat: Oropharynx is clear and moist.       Dry tongue  Eyes: Conjunctivae normal and EOM are normal. Pupils are equal, round, and reactive to light.  Neck: Normal range of motion. Neck supple. No tracheal deviation present.  Cardiovascular: Normal rate.  Exam reveals no friction rub.   Murmur heard. Pulmonary/Chest: He is in respiratory distress. He has wheezes.       Audible wheezing. Diffused wheezes in all lung fields and retractions  Abdominal: Soft. Bowel sounds are normal. He exhibits no distension. There is no tenderness. There is no rebound and no guarding.  Musculoskeletal: Normal range of  motion. He exhibits no edema and no tenderness.  Neurological: He is alert and oriented to person, place, and time. No sensory deficit.  Skin: Skin is warm and dry.       Very warm to touch  Psychiatric: He has a normal mood and affect. His behavior is normal.    ED Course  Procedures (including critical care time) DIAGNOSTIC STUDIES: Oxygen Saturation is 100% on room air, normal by my interpretation.    COORDINATION OF CARE: 22:08: Physical exam performed.  Pt started on continous nebulizer and started on BiPap by respiratory therapist.   23:12 Pt sleeping on bipap, still has diffuse expir wheezing on continuous nebulizer, sons in room, discussed need to be admitted.   23:33 Dr Oralia Manis, admit to ICU    Results for orders placed during the hospital encounter of 03/11/12  CBC WITH DIFFERENTIAL      Component Value Range   WBC 8.5  4.0 - 10.5 K/uL   RBC 4.42  4.22 - 5.81 MIL/uL   Hemoglobin 13.9  13.0 - 17.0 g/dL   HCT 16.1  09.6 - 04.5 %   MCV 96.6  78.0 - 100.0 fL   MCH 31.4  26.0 - 34.0 pg   MCHC 32.6  30.0 - 36.0 g/dL   RDW 40.9  81.1 - 91.4 %   Platelets 152  150 - 400 K/uL   Neutrophils Relative 65  43 - 77 %   Neutro Abs 5.5  1.7 - 7.7 K/uL   Lymphocytes Relative 27  12 - 46 %   Lymphs Abs 2.3  0.7 - 4.0 K/uL   Monocytes Relative 5  3 - 12 %   Monocytes Absolute 0.4  0.1 - 1.0 K/uL   Eosinophils Relative 3  0 - 5 %   Eosinophils Absolute 0.2  0.0 - 0.7 K/uL   Basophils Relative 0  0 - 1 %   Basophils Absolute 0.0  0.0 - 0.1 K/uL  COMPREHENSIVE METABOLIC PANEL      Component Value Range   Sodium 140  135 - 145 mEq/L   Potassium 4.1  3.5 - 5.1 mEq/L   Chloride 97  96 - 112 mEq/L   CO2 36 (*) 19 - 32 mEq/L   Glucose, Bld 121 (*) 70 - 99 mg/dL   BUN 11  6 - 23 mg/dL   Creatinine, Ser 7.82  0.50 - 1.35 mg/dL   Calcium 9.9  8.4 - 95.6 mg/dL   Total Protein 7.0  6.0 - 8.3 g/dL   Albumin 3.8  3.5 - 5.2 g/dL   AST 26  0 - 37 U/L   ALT 26  0 - 53 U/L   Alkaline  Phosphatase 75  39 - 117 U/L   Total Bilirubin 0.2 (*) 0.3 - 1.2 mg/dL   GFR calc non Af Amer 85 (*) >90 mL/min   GFR calc Af Amer >90  >90 mL/min   Laboratory interpretation all normal except metabolic alkalosis    Dg Chest Portable 1 View  03/11/2012  *RADIOLOGY REPORT*  Clinical Data: Shortness of breath.  PORTABLE CHEST - 1 VIEW  Comparison: Chest radiograph performed 07/04/2011  Findings: The lungs are well-aerated.  Mild chronic vascular congestion is noted, with superimposed chronic interstitial lung changes.  This is grossly unchanged from the prior study.  There is no evidence of pleural effusion or pneumothorax.  The cardiomediastinal silhouette is within normal limits.  No acute osseous abnormalities are  seen.  IMPRESSION: Chronic lung changes and underlying vascular congestion again noted; no acute cardiopulmonary process seen.   Original Report Authenticated By: Tonia Ghent, M.D.      Date: 03/11/2012  Rate: 96  Rhythm: normal sinus rhythm  QRS Axis: normal  Intervals: normal  ST/T Wave abnormalities: normal  Conduction Disutrbances:none  Narrative Interpretation:   Old EKG Reviewed: unchanged from 07/04/2011    1. COPD with acute exacerbation   2. Compensated metabolic alkalosis    Plan admission  Devoria Albe, MD, FACEP   CRITICAL CARE Performed by: Devoria Albe L   Total critical care time: 37 min Critical care time was exclusive of separately billable procedures and treating other patients.  Critical care was necessary to treat or prevent imminent or life-threatening deterioration.  Critical care was time spent personally by me on the following activities: development of treatment plan with patient and/or surrogate as well as nursing, discussions with consultants, evaluation of patient's response to treatment, examination of patient, obtaining history from patient or surrogate, ordering and performing treatments and interventions, ordering and review of  laboratory studies, ordering and review of radiographic studies, pulse oximetry and re-evaluation of patient's condition.    MDM   I personally performed the services described in this documentation, which was scribed in my presence. The recorded information has been reviewed and considered.  Devoria Albe, MD, Armando Gang         [name], MD 03/11/12 [phone]

## 2012-03-11 NOTE — ED Notes (Signed)
States he has been having increasing shortness of breath for 1 week.

## 2012-03-12 ENCOUNTER — Encounter (HOSPITAL_COMMUNITY): Payer: Self-pay | Admitting: Intensive Care

## 2012-03-12 DIAGNOSIS — F17200 Nicotine dependence, unspecified, uncomplicated: Secondary | ICD-10-CM

## 2012-03-12 DIAGNOSIS — I10 Essential (primary) hypertension: Secondary | ICD-10-CM

## 2012-03-12 DIAGNOSIS — J9620 Acute and chronic respiratory failure, unspecified whether with hypoxia or hypercapnia: Principal | ICD-10-CM

## 2012-03-12 DIAGNOSIS — J441 Chronic obstructive pulmonary disease with (acute) exacerbation: Secondary | ICD-10-CM

## 2012-03-12 LAB — COMPREHENSIVE METABOLIC PANEL
ALT: 24 U/L (ref 0–53)
AST: 23 U/L (ref 0–37)
Albumin: 3.6 g/dL (ref 3.5–5.2)
CO2: 34 mEq/L — ABNORMAL HIGH (ref 19–32)
Chloride: 96 mEq/L (ref 96–112)
Creatinine, Ser: 0.64 mg/dL (ref 0.50–1.35)
GFR calc non Af Amer: >90 mL/min (ref >90)
Potassium: 4.4 mEq/L (ref 3.5–5.1)
Sodium: 138 mEq/L (ref 135–145)
Total Bilirubin: 0.2 mg/dL — ABNORMAL LOW (ref 0.3–1.2)

## 2012-03-12 LAB — CBC
MCH: 31.3 pg (ref 26.0–34.0)
MCV: 95.9 fL (ref 78.0–100.0)
Platelets: 162 10*3/uL (ref 150–400)
RDW: 13.5 % (ref 11.5–15.5)
WBC: 5.1 10*3/uL (ref 4.0–10.5)

## 2012-03-12 LAB — URINALYSIS, ROUTINE W REFLEX MICROSCOPIC
Bilirubin Urine: NEGATIVE
Hgb urine dipstick: NEGATIVE
Nitrite: NEGATIVE
Protein, ur: NEGATIVE mg/dL
Specific Gravity, Urine: 1.02 (ref 1.005–1.030)
Urobilinogen, UA: 0.2 mg/dL (ref 0.0–1.0)

## 2012-03-12 LAB — HEMOGLOBIN A1C
Hgb A1c MFr Bld: 6.2 % — ABNORMAL HIGH (ref <5.7)
Mean Plasma Glucose: 131 mg/dL — ABNORMAL HIGH (ref <117)

## 2012-03-12 MED ORDER — SIMVASTATIN 20 MG PO TABS
40.0000 mg | ORAL_TABLET | Freq: Every day | ORAL | Status: DC
  Administered 2012-03-12 – 2012-03-17 (×6): 40 mg via ORAL
  Filled 2012-03-12 (×6): qty 2

## 2012-03-12 MED ORDER — OMEGA-3-ACID ETHYL ESTERS 1 G PO CAPS
1.0000 g | ORAL_CAPSULE | Freq: Every day | ORAL | Status: DC
  Administered 2012-03-12 – 2012-03-17 (×6): 1 g via ORAL
  Filled 2012-03-12 (×6): qty 1

## 2012-03-12 MED ORDER — ALPRAZOLAM 0.5 MG PO TABS
1.0000 mg | ORAL_TABLET | Freq: Two times a day (BID) | ORAL | Status: DC | PRN
  Administered 2012-03-12 – 2012-03-13 (×4): 1 mg via ORAL
  Filled 2012-03-12 (×4): qty 2

## 2012-03-12 MED ORDER — ACETAMINOPHEN 325 MG PO TABS: 650.0000 mg | ORAL_TABLET | Freq: Four times a day (QID) | ORAL | Status: DC | PRN

## 2012-03-12 MED ORDER — METHYLPREDNISOLONE SODIUM SUCC 125 MG IJ SOLR
125.0000 mg | Freq: Four times a day (QID) | INTRAMUSCULAR | Status: DC
  Administered 2012-03-12: 125 mg via INTRAVENOUS
  Filled 2012-03-12: qty 2

## 2012-03-12 MED ORDER — IPRATROPIUM BROMIDE 0.02 % IN SOLN
0.5000 mg | RESPIRATORY_TRACT | Status: DC
  Administered 2012-03-12 – 2012-03-17 (×24): 0.5 mg via RESPIRATORY_TRACT
  Filled 2012-03-12 (×23): qty 2.5

## 2012-03-12 MED ORDER — ENOXAPARIN SODIUM 40 MG/0.4ML ~~LOC~~ SOLN
40.0000 mg | SUBCUTANEOUS | Status: DC
  Administered 2012-03-12 – 2012-03-16 (×5): 40 mg via SUBCUTANEOUS
  Filled 2012-03-12 (×6): qty 0.4

## 2012-03-12 MED ORDER — POTASSIUM CHLORIDE IN NACL 20-0.9 MEQ/L-% IV SOLN
INTRAVENOUS | Status: DC
  Administered 2012-03-12: 1000 mL via INTRAVENOUS

## 2012-03-12 MED ORDER — PNEUMOCOCCAL VAC POLYVALENT 25 MCG/0.5ML IJ INJ
0.5000 mL | INJECTION | INTRAMUSCULAR | Status: AC
  Administered 2012-03-13: 0.5 mL via INTRAMUSCULAR
  Filled 2012-03-12: qty 0.5

## 2012-03-12 MED ORDER — LEVOFLOXACIN IN D5W 750 MG/150ML IV SOLN
INTRAVENOUS | Status: AC
  Filled 2012-03-12: qty 150

## 2012-03-12 MED ORDER — HYDROMORPHONE HCL PF 1 MG/ML IJ SOLN
0.5000 mg | INTRAMUSCULAR | Status: DC | PRN
  Administered 2012-03-12 – 2012-03-14 (×5): 0.5 mg via INTRAVENOUS
  Filled 2012-03-12 (×6): qty 1

## 2012-03-12 MED ORDER — LEVOFLOXACIN 750 MG PO TABS
750.0000 mg | ORAL_TABLET | Freq: Every day | ORAL | Status: AC
  Administered 2012-03-12 – 2012-03-15 (×4): 750 mg via ORAL
  Filled 2012-03-12 (×6): qty 1

## 2012-03-12 MED ORDER — ALBUTEROL SULFATE (5 MG/ML) 0.5% IN NEBU: 2.5000 mg | INHALATION_SOLUTION | RESPIRATORY_TRACT | Status: DC | PRN

## 2012-03-12 MED ORDER — TRAZODONE HCL 50 MG PO TABS
25.0000 mg | ORAL_TABLET | Freq: Every evening | ORAL | Status: DC | PRN
  Administered 2012-03-12: 25 mg via ORAL
  Filled 2012-03-12: qty 1

## 2012-03-12 MED ORDER — BISACODYL 5 MG PO TBEC
5.0000 mg | DELAYED_RELEASE_TABLET | Freq: Every day | ORAL | Status: DC | PRN
  Administered 2012-03-14: 5 mg via ORAL
  Filled 2012-03-12: qty 1

## 2012-03-12 MED ORDER — NICOTINE 21 MG/24HR TD PT24
21.0000 mg | MEDICATED_PATCH | Freq: Every day | TRANSDERMAL | Status: DC
  Administered 2012-03-12 – 2012-03-14 (×3): 21 mg via TRANSDERMAL
  Filled 2012-03-12 (×3): qty 1

## 2012-03-12 MED ORDER — BUDESONIDE-FORMOTEROL FUMARATE 160-4.5 MCG/ACT IN AERO
2.0000 | INHALATION_SPRAY | Freq: Two times a day (BID) | RESPIRATORY_TRACT | Status: DC
  Administered 2012-03-12 – 2012-03-17 (×11): 2 via RESPIRATORY_TRACT
  Filled 2012-03-12: qty 6

## 2012-03-12 MED ORDER — PANTOPRAZOLE SODIUM 40 MG PO TBEC
80.0000 mg | DELAYED_RELEASE_TABLET | Freq: Every day | ORAL | Status: DC
  Administered 2012-03-12 – 2012-03-17 (×6): 80 mg via ORAL
  Filled 2012-03-12 (×6): qty 2

## 2012-03-12 MED ORDER — ALBUTEROL SULFATE (5 MG/ML) 0.5% IN NEBU
2.5000 mg | INHALATION_SOLUTION | RESPIRATORY_TRACT | Status: DC
Start: 2012-03-12 — End: 2012-03-17
  Administered 2012-03-12 – 2012-03-17 (×24): 2.5 mg via RESPIRATORY_TRACT
  Filled 2012-03-12 (×22): qty 0.5

## 2012-03-12 MED ORDER — METHYLPREDNISOLONE SODIUM SUCC 125 MG IJ SOLR
60.0000 mg | Freq: Two times a day (BID) | INTRAMUSCULAR | Status: DC
Start: 2012-03-12 — End: 2012-03-14
  Administered 2012-03-12 – 2012-03-14 (×4): 60 mg via INTRAVENOUS
  Filled 2012-03-12 (×5): qty 2

## 2012-03-12 MED ORDER — OMEGA-3 FISH OIL 1000 MG PO CAPS: 1.0000 | ORAL_CAPSULE | Freq: Every day | ORAL | Status: DC

## 2012-03-12 MED ORDER — LEVOFLOXACIN IN D5W 750 MG/150ML IV SOLN
750.0000 mg | Freq: Every day | INTRAVENOUS | Status: DC
  Administered 2012-03-12: 750 mg via INTRAVENOUS
  Filled 2012-03-12 (×2): qty 150

## 2012-03-12 MED ORDER — FLEET ENEMA 7-19 GM/118ML RE ENEM: 1.0000 | ENEMA | Freq: Once | RECTAL | Status: AC | PRN

## 2012-03-12 MED ORDER — ASPIRIN EC 81 MG PO TBEC
81.0000 mg | DELAYED_RELEASE_TABLET | Freq: Every day | ORAL | Status: DC
  Administered 2012-03-12 – 2012-03-17 (×6): 81 mg via ORAL
  Filled 2012-03-12 (×6): qty 1

## 2012-03-12 MED ORDER — GUAIFENESIN ER 600 MG PO TB12
1200.0000 mg | ORAL_TABLET | Freq: Two times a day (BID) | ORAL | Status: DC
  Administered 2012-03-12 – 2012-03-17 (×12): 1200 mg via ORAL
  Filled 2012-03-12 (×12): qty 2

## 2012-03-12 MED ORDER — ONDANSETRON HCL 4 MG/2ML IJ SOLN: 4.0000 mg | Freq: Four times a day (QID) | INTRAMUSCULAR | Status: DC | PRN

## 2012-03-12 NOTE — H&P (Signed)
[facility] History and Physical  [name] [name]  WUJ:[id number]  DOB: December 29, 1940   DOA: 03/12/2012   PCP:   Remus Loffler, PA   Chief Complaint:  Progressively worsening shortness of breath for 2 weeks  HPI: [name] [name] is an 71 y.o. male.   Obese Caucasian gentleman with known COPD and and chronic 2 pack per day smoker, maintained on home oxygen and home nebulizer, has been having worsening cough productive of white-yellow sputum for the past 2 weeks. Not relieved by home nebulizers, and has been getting progressively. Fell at home today and did not sustain any injuries but was simply too weak to get up unaided because of his progressive. About 12 hours later he was persuaded to allow EMS to the called to be evaluated for his persistent cough and dyspnea. He received treatment for COPD exacerbation but by the time of arrival in the emergency room he required BiPAP for stabilization and continues to wheeze despite nebulizations and steroids..  He has been having chills but has not measured a fever. Denies frequency or dysuria denies nausea vomiting diarrhea constipation or jaundice.  Rewiew of Systems:   All systems negative except as marked bold or noted in the HPI;  Constitutional: positive positive for malaise, fever and chills. ;  Eyes: Negative for eye pain, redness and discharge. ;  ENMT: Negative for ear pain, hoarseness, nasal congestion, sinus pressure and sore throat. ;  Cardiovascular: Negative for chest pain, palpitations, diaphoresis, and peripheral edema. ;  Respiratory: Negative for, hemoptysis, wheezing . ;  Gastrointestinal: Negative for nausea, vomiting, diarrhea, constipation, abdominal pain, melena, blood in stool, hematemesis, jaundice and rectal bleeding. unusual weight loss..   Genitourinary: Negative for frequency, dysuria, incontinence,flank pain and hematuria; Musculoskeletal: Negative for swelling and trauma.;  Skin: . Negative for pruritus, rash,  abrasions, bruising and skin lesion.; ulcerations Neuro: Negative for headache, lightheadedness and neck stiffness altered level of consciousness , altered mental status, extremity weakness, burning feet, involuntary movement, seizure and syncope.  Psych: negative for depression, insomnia, tearfulness, panic attacks, hallucinations, paranoia, suicidal or homicidal ideation   Past Medical History  Diagnosis Date  . COPD (chronic obstructive pulmonary disease)   . Asthma   . Tobacco abuse 09/29/2011  . Hypercholesteremia   . GERD (gastroesophageal reflux disease)   . Headache   . BPH (benign prostatic hyperplasia) 10/01/2011    Per cystoscopy  . 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  . Pulmonary nodule 09/2011    Stable appearance  . Chronic back pain   . Chronic anxiety     Past Surgical History  Procedure Date  . Cardiac catheterization 2003    [facility]  . Fracture surgery     R ring finger, pin placed  . Cystoscopy 09/30/2011    Procedure: CYSTOSCOPY FLEXIBLE;  Surgeon: Ky Barban, MD;  Location: [facility];  Service: Urology;  Laterality: N/A;  . Transurethral resection of bladder tumor 10/01/2011    Procedure: TRANSURETHRAL RESECTION OF BLADDER TUMOR (TURBT);  Surgeon: Ky Barban, MD;  Location: [facility];  Service: Urology;  Laterality: N/A;    Medications:  HOME MEDS: Prior to Admission medications   Medication Sig Start Date End Date Taking? Authorizing Provider  albuterol (PROVENTIL) (2.5 MG/3ML) 0.083% nebulizer solution Take 2.5 mg by nebulization every 6 (six) hours as needed. For shortness of breath    Yes Historical Provider, MD  albuterol-ipratropium (COMBIVENT) 18-103 MCG/ACT inhaler Inhale 2 puffs into the lungs  4 (four) times daily.     Yes Historical Provider, MD  ALPRAZolam Prudy Feeler) 1 MG tablet Take 1 mg by mouth 2 (two) times daily as needed. anxiety   Yes Historical Provider, MD  aspirin EC 81 MG tablet Take 81 mg by mouth daily.  10/02/11  Yes Elliot Cousin, MD  budesonide-formoterol [facility]) 160-4.5 MCG/ACT inhaler Inhale 2 puffs into the lungs 2 (two) times daily.     Yes Historical Provider, MD  Cholecalciferol (VITAMIN D3) 1000 UNITS CAPS Take 1 capsule by mouth daily.     Yes Historical Provider, MD  Garlic 1000 MG CAPS Take 1 capsule by mouth daily.    Yes Historical Provider, MD  Multiple Vitamins-Minerals (CENTRUM) tablet Take 1 tablet by mouth daily.    Yes Historical Provider, MD  omega-3 fish oil (MAXEPA) 1000 MG CAPS capsule Take 1 capsule by mouth daily.     Yes Historical Provider, MD  omeprazole (PRILOSEC) 20 MG capsule Take 20 mg by mouth 2 (two) times daily.     Yes Historical Provider, MD  simvastatin (ZOCOR) 40 MG tablet Take 40 mg by mouth daily.    Yes Historical Provider, MD  selenium sulfide (SELSUN) 2.5 % shampoo Apply topically daily. Apply locally, leave on for one hour, then rinse off.  Repeat daily until gone. 02/22/12   Geoffery Lyons, MD     Allergies:  Allergies  Allergen Reactions  . Iohexol      Code: HIVES, Desc: PT STATES HE BROKE OUT IN HIVES AND RASH AFTER CT NECK EARLY SEPT 2011; NO RESP PROBLEMS; NEEDS PRE-MEDS; MKS, Onset Date: [id number]     Social History:   r. His smoking use included Cigarettes. He has a 58 pack-year smoking history. He has quit using smokeless tobacco. He reports that he does not drink alcohol or use illicit drugs.he has resumed smoking one to 2 packs per day  Family History: Family History  Problem Relation Age of Onset  . Emphysema Brother     was not close to family; does not truly know their medical problems.  . Emphysema Brother   . Emphysema Brother   . Cancer Neg Hx      Physical Exam: Filed Vitals:   03/11/12 2315 03/11/12 2319 03/11/12 2330 03/11/12 2345  BP: 119/72 119/72 116/69 119/68  Pulse: 92 91 91 88  Temp:      TempSrc:      Resp: 27 24 26 22   SpO2: 95% 93% 95% 95%   Blood pressure 119/68, pulse 88, temperature 98.1 F (36.7  C), temperature source Oral, resp. rate 22, SpO2 95.00%.  GEN:  Pleasant elderly Caucasian man lying in the stretcher in no acute distress, on BiPAP 12/6; cooperative with exam PSYCH:  alert and oriented x4; does not appear anxious or depressed; affect is appropriate. HEENT: Mucous membranes pink and anicteric; PERRLA; EOM intact; no cervical lymphadenopathy nor thyromegaly or carotid bruit; ; Breasts:: Not examined CHEST WALL: No tenderness CHEST: Tachypnea, diffuse bilateral wheezing. HEART:no CVA tenderness ABDOMEN: Obese, soft non-tender; no masses, no organomegaly, normal abdominal bowel sounds; ; no intertriginous candida. Rectal Exam: Not done EXTREMITIES:; age-appropriate arthropathy of the hands and knees; no edema; no ulcerations. Genitalia: not examined PULSES: 2+ and symmetric SKIN: Normal hydration no rash or ulceration CNS: Cranial nerves 2-12 grossly intact no focal lateralizing neurologic deficit   Labs on Admission:  Basic Metabolic Panel:  Lab 03/11/12 5409  NA 140  K 4.1  CL 97  CO2 36*  GLUCOSE  121*  BUN 11  CREATININE 0.88  CALCIUM 9.9  MG --  PHOS --   Liver Function Tests:  Lab 03/11/12 2221  AST 26  ALT 26  ALKPHOS 75  BILITOT 0.2*  PROT 7.0  ALBUMIN 3.8   No results found for this basename: LIPASE:5,AMYLASE:5 in the last 168 hours No results found for this basename: AMMONIA:5 in the last 168 hours CBC:  Lab 03/11/12 2221  WBC 8.5  NEUTROABS 5.5  HGB 13.9  HCT 42.7  MCV 96.6  PLT 152   Cardiac Enzymes:  Lab 03/11/12 2308  CKTOTAL --  CKMB --  CKMBINDEX --  TROPONINI <0.30   BNP: No components found with this basename: POCBNP:5 D-dimer: No components found with this basename: D-DIMER:5 CBG: No results found for this basename: GLUCAP:5 in the last 168 hours  Radiological Exams on Admission: Dg Chest Portable 1 View  03/11/2012  *RADIOLOGY REPORT*  Clinical Data: Shortness of breath.  PORTABLE CHEST - 1 VIEW  Comparison:  Chest radiograph performed 07/04/2011  Findings: The lungs are well-aerated.  Mild chronic vascular congestion is noted, with superimposed chronic interstitial lung changes.  This is grossly unchanged from the prior study.  There is no evidence of pleural effusion or pneumothorax.  The cardiomediastinal silhouette is within normal limits.  No acute osseous abnormalities are seen.  IMPRESSION: Chronic lung changes and underlying vascular congestion again noted; no acute cardiopulmonary process seen.   Original Report Authenticated By: Tonia Ghent, M.D.     EKG: Independently reviewed. Normal sinus rhythm, no ST segment changes   Assessment/Plan Present on Admission:  . Acute and chronic respiratory failure (acute-on-chronic) . Tobacco abuse . COPD exacerbation   PLAN: We'll continue this patient on BiPAP and admitted to the step down unit for oxygen supplementation, serial nebulizations, antibiotics and steroids  Other plans as per orders.  Code Status: DO NOT INTUBATE, status, confirmed. Family Communication: His son and his housemates are present at the interview and examination.; His power of attorney remains his girlfriend noted in the H&P of June 15 Disposition Plan: Depending on response to therapy  Critical care time: 60 minutes.   Coreyon Nicotra Nocturnist [facility] Pager [phone]  03/12/2012, 12:37 AM

## 2012-03-12 NOTE — ED Notes (Signed)
Contact number for son - cell phone [phone]

## 2012-03-12 NOTE — ED Notes (Signed)
Pt appears much more comfortable, respiratory rate decreased to 22, BiPap in place and patient tolerating well.

## 2012-03-12 NOTE — Progress Notes (Signed)
Patient admitted after midnight.  Chart reviewed. Patient examined. Patient feels better and is off BiPAP. Continue step on monitoring, steroids, antibiotics, bronchodilators.

## 2012-03-12 NOTE — Progress Notes (Signed)
Patient is stilling doing well off the BIPAP and will continue to monitor him.If patient status changes, he will be placed back on BIPAP.

## 2012-03-13 ENCOUNTER — Encounter (HOSPITAL_COMMUNITY): Payer: Self-pay | Admitting: Internal Medicine

## 2012-03-13 DIAGNOSIS — E785 Hyperlipidemia, unspecified: Secondary | ICD-10-CM

## 2012-03-13 DIAGNOSIS — F419 Anxiety disorder, unspecified: Secondary | ICD-10-CM

## 2012-03-13 DIAGNOSIS — F411 Generalized anxiety disorder: Secondary | ICD-10-CM

## 2012-03-13 HISTORY — DX: Anxiety disorder, unspecified: F41.9

## 2012-03-13 HISTORY — DX: Hyperlipidemia, unspecified: E78.5

## 2012-03-13 NOTE — Progress Notes (Signed)
Pt stated he only wanted to wear BIPAP if he became SOB or if his sats dropped.

## 2012-03-13 NOTE — Progress Notes (Signed)
Remained off BiPAP overnight. Per nursing staff, was breathing easily until he got up to wash up.  Subjective: Felt confused last night. Breathing easier until he got out of bed recently. Now very short of breath.  Objective: Vital signs in last 24 hours: Filed Vitals:   03/13/12 0733 03/13/12 0800 03/13/12 0900 03/13/12 1000  BP:  83/49 112/60 123/71  Pulse:  90 86 89  Temp:  97.9 F (36.6 C)    TempSrc:  Oral    Resp:  21 28 20   Height:      Weight:      SpO2: 97% 93% 97% 95%   Weight change: 0 kg (0 lb)  Intake/Output Summary (Last 24 hours) at 03/13/12 1116 Last data filed at 03/13/12 1000  Gross per 24 hour  Intake   1310 ml  Output    400 ml  Net    910 ml   General: Moderate respiratory distress with truncated sentences. Alert and oriented. Appears uncomfortable. Lungs: Diminished throughout with a prolonged expiratory phase and bilateral wheezes. No rhonchi or rales Cardiovascular regular rate rhythm without murmurs gallops rubs Abdomen soft nontender nondistended Extremities no clubbing cyanosis or edema  Lab Results: Basic Metabolic Panel:  Lab 03/12/12 1610 03/11/12 2221  NA 138 140  K 4.4 4.1  CL 96 97  CO2 34* 36*  GLUCOSE 196* 121*  BUN 11 11  CREATININE 0.64 0.88  CALCIUM 9.7 9.9  MG 2.0 --  PHOS -- --   Liver Function Tests:  Lab 03/12/12 0447 03/11/12 2221  AST 23 26  ALT 24 26  ALKPHOS 73 75  BILITOT 0.2* 0.2*  PROT 7.0 7.0  ALBUMIN 3.6 3.8   No results found for this basename: LIPASE:2,AMYLASE:2 in the last 168 hours No results found for this basename: AMMONIA:2 in the last 168 hours CBC:  Lab 03/12/12 0447 03/11/12 2221  WBC 5.1 8.5  NEUTROABS -- 5.5  HGB 13.6 13.9  HCT 41.6 42.7  MCV 95.9 96.6  PLT 162 152   Cardiac Enzymes:  Lab 03/11/12 2308  CKTOTAL --  CKMB --  CKMBINDEX --  TROPONINI <0.30   BNP:  Lab 03/11/12 2308  PROBNP 77.4   D-Dimer: No results found for this basename: DDIMER:2 in the last 168  hours CBG: No results found for this basename: GLUCAP:6 in the last 168 hours Hemoglobin A1C:  Lab 03/12/12 0146  HGBA1C 6.2*   Fasting Lipid Panel: No results found for this basename: CHOL,HDL,LDLCALC,TRIG,CHOLHDL,LDLDIRECT in the last 960 hours Thyroid Function Tests: No results found for this basename: TSH,T4TOTAL,FREET4,T3FREE,THYROIDAB in the last 168 hours Coagulation: No results found for this basename: LABPROT:4,INR:4 in the last 168 hours Anemia Panel: No results found for this basename: VITAMINB12,FOLATE,FERRITIN,TIBC,IRON,RETICCTPCT in the last 168 hours Urine Drug Screen: Drugs of Abuse  No results found for this basename: labopia, cocainscrnur, labbenz, amphetmu, thcu, labbarb    Alcohol Level: No results found for this basename: ETH:2 in the last 168 hours Urinalysis:  Lab 03/12/12 0354  COLORURINE YELLOW  LABSPEC 1.020  PHURINE 6.0  GLUCOSEU NEGATIVE  HGBUR NEGATIVE  BILIRUBINUR NEGATIVE  KETONESUR NEGATIVE  PROTEINUR NEGATIVE  UROBILINOGEN 0.2  NITRITE NEGATIVE  LEUKOCYTESUR NEGATIVE   Micro Results: Recent Results (from the past 240 hour(s))  MRSA PCR SCREENING     Status: Normal   Collection Time   03/12/12  2:02 AM      Component Value Range Status Comment   MRSA by PCR NEGATIVE  NEGATIVE Final  Studies/Results: Dg Chest Portable 1 View  03/11/2012  *RADIOLOGY REPORT*  Clinical Data: Shortness of breath.  PORTABLE CHEST - 1 VIEW  Comparison: Chest radiograph performed 07/04/2011  Findings: The lungs are well-aerated.  Mild chronic vascular congestion is noted, with superimposed chronic interstitial lung changes.  This is grossly unchanged from the prior study.  There is no evidence of pleural effusion or pneumothorax.  The cardiomediastinal silhouette is within normal limits.  No acute osseous abnormalities are seen.  IMPRESSION: Chronic lung changes and underlying vascular congestion again noted; no acute cardiopulmonary process seen.   Original  Report Authenticated By: Tonia Ghent, M.D.    Scheduled Meds:   . albuterol  2.5 mg Nebulization Q4H WA  . aspirin EC  81 mg Oral Daily  . budesonide-formoterol  2 puff Inhalation BID  . enoxaparin (LOVENOX) injection  40 mg Subcutaneous Q24H  . guaiFENesin  1,200 mg Oral BID  . ipratropium  0.5 mg Nebulization Q4H WA  . levofloxacin  750 mg Oral Daily  . methylPREDNISolone (SOLU-MEDROL) injection  60 mg Intravenous Q12H  . nicotine  21 mg Transdermal Daily  . omega-3 acid ethyl esters  1 g Oral Daily  . pantoprazole  80 mg Oral Daily  . [COMPLETED] pneumococcal 23 valent vaccine  0.5 mL Intramuscular Tomorrow-1000  . simvastatin  40 mg Oral Daily   Continuous Infusions:  PRN Meds:.acetaminophen, albuterol, ALPRAZolam, bisacodyl, HYDROmorphone (DILAUDID) injection, ondansetron (ZOFRAN) IV, [EXPIRED] sodium phosphate, traZODone Assessment/Plan: Principal Problem:  *Acute and chronic respiratory failure (acute-on-chronic): Work of breathing much worse today after minimal exertion. May need to go back on BiPAP, but patient refuses at this time. Continue steroids, antibiotics, step down monitoring, bronchodilators Active Problems:  COPD exacerbation  Tobacco abuse  Anxiety: Nurse has given patient a dose of Xanax. Hopefully this will help with his respiratory distress per  Hyperlipidemia   LOS: 2 days   Conner Muegge L 03/13/2012, 11:16 AM

## 2012-03-13 NOTE — Progress Notes (Signed)
UR chart review completed.  

## 2012-03-14 MED ORDER — TRAZODONE HCL 50 MG PO TABS
50.0000 mg | ORAL_TABLET | Freq: Every evening | ORAL | Status: DC | PRN
  Administered 2012-03-14: 50 mg via ORAL
  Filled 2012-03-14: qty 1

## 2012-03-14 MED ORDER — ALPRAZOLAM 1 MG PO TABS
1.0000 mg | ORAL_TABLET | Freq: Three times a day (TID) | ORAL | Status: DC | PRN
  Administered 2012-03-14 – 2012-03-17 (×7): 1 mg via ORAL
  Filled 2012-03-14: qty 2
  Filled 2012-03-14 (×2): qty 1
  Filled 2012-03-14: qty 2
  Filled 2012-03-14 (×2): qty 1
  Filled 2012-03-14: qty 2

## 2012-03-14 MED ORDER — HYDROCODONE-ACETAMINOPHEN 5-325 MG PO TABS
1.0000 | ORAL_TABLET | Freq: Four times a day (QID) | ORAL | Status: DC | PRN
  Administered 2012-03-14 – 2012-03-17 (×9): 1 via ORAL
  Filled 2012-03-14 (×9): qty 1

## 2012-03-14 MED ORDER — PREDNISONE 20 MG PO TABS
40.0000 mg | ORAL_TABLET | Freq: Two times a day (BID) | ORAL | Status: DC
  Administered 2012-03-14 – 2012-03-17 (×7): 40 mg via ORAL
  Filled 2012-03-14 (×7): qty 2

## 2012-03-14 NOTE — Progress Notes (Signed)
PT REQUESTED THAT DR SULLIVAN BE CALLED TO DISCUSS W/ HIM THE POSSIBILITY THAT HIS NICODERM PATCH MAY BE CAUSING HIM TO HAVE NIGHTMARES. HE IS CONCERNED THAT THE DOSE MAY BE TOO HIGH.DR SULLIVAN NOTIFIED BY TEXT. PT  TOLD BY THIS RN THAT IF HIS CONCERN WAS SEVERE,THE NICODERM PATCH COULD BE REMOVED. PT DECIDED TO LEAVE IT ON AND SEE IF NIGHTMARES REOCCURRED TONIGHT.

## 2012-03-14 NOTE — Progress Notes (Signed)
Patient was on BiPAP for a brief time yesterday. He was off it last night.  Subjective: Dyspnea improved. He has been able to go to the bedside commode and had less respiratory distress per his report. Slept well off BiPAP. Patient wishes to quit smoking. Takes hydrocodone as needed for leg pain at home  Objective: Vital signs in last 24 hours: Filed Vitals:   03/14/12 0600 03/14/12 0615 03/14/12 0620 03/14/12 0718  BP: 124/62 131/74 147/91   Pulse: 65 66 89   Temp:      TempSrc:      Resp: 24 22 27    Height:      Weight:      SpO2: 96% 96% 91% 98%   Weight change: -0.5 kg (-1 lb 1.6 oz)  Intake/Output Summary (Last 24 hours) at 03/14/12 0844 Last data filed at 03/14/12 0600  Gross per 24 hour  Intake  40981 ml  Output   1500 ml  Net  12120 ml   General: Appears more comfortable. Breathing easy. Visiting with family. Alert and oriented. Lungs: Diminished throughout with a prolonged expiratory phase and bilateral wheezes. No rhonchi or rales Cardiovascular regular rate rhythm without murmurs gallops rubs Abdomen soft nontender nondistended Extremities no clubbing cyanosis or edema  Lab Results: Basic Metabolic Panel:  Lab 03/12/12 1914 03/11/12 2221  NA 138 140  K 4.4 4.1  CL 96 97  CO2 34* 36*  GLUCOSE 196* 121*  BUN 11 11  CREATININE 0.64 0.88  CALCIUM 9.7 9.9  MG 2.0 --  PHOS -- --   Liver Function Tests:  Lab 03/12/12 0447 03/11/12 2221  AST 23 26  ALT 24 26  ALKPHOS 73 75  BILITOT 0.2* 0.2*  PROT 7.0 7.0  ALBUMIN 3.6 3.8   No results found for this basename: LIPASE:2,AMYLASE:2 in the last 168 hours No results found for this basename: AMMONIA:2 in the last 168 hours CBC:  Lab 03/12/12 0447 03/11/12 2221  WBC 5.1 8.5  NEUTROABS -- 5.5  HGB 13.6 13.9  HCT 41.6 42.7  MCV 95.9 96.6  PLT 162 152   Cardiac Enzymes:  Lab 03/11/12 2308  CKTOTAL --  CKMB --  CKMBINDEX --  TROPONINI <0.30   BNP:  Lab 03/11/12 2308  PROBNP 77.4   D-Dimer: No  results found for this basename: DDIMER:2 in the last 168 hours CBG: No results found for this basename: GLUCAP:6 in the last 168 hours Hemoglobin A1C:  Lab 03/12/12 0146  HGBA1C 6.2*   Fasting Lipid Panel: No results found for this basename: CHOL,HDL,LDLCALC,TRIG,CHOLHDL,LDLDIRECT in the last 782 hours Thyroid Function Tests: No results found for this basename: TSH,T4TOTAL,FREET4,T3FREE,THYROIDAB in the last 168 hours Coagulation: No results found for this basename: LABPROT:4,INR:4 in the last 168 hours Anemia Panel: No results found for this basename: VITAMINB12,FOLATE,FERRITIN,TIBC,IRON,RETICCTPCT in the last 168 hours Urine Drug Screen: Drugs of Abuse  No results found for this basename: labopia,  cocainscrnur,  labbenz,  amphetmu,  thcu,  labbarb    Alcohol Level: No results found for this basename: ETH:2 in the last 168 hours Urinalysis:  Lab 03/12/12 0354  COLORURINE YELLOW  LABSPEC 1.020  PHURINE 6.0  GLUCOSEU NEGATIVE  HGBUR NEGATIVE  BILIRUBINUR NEGATIVE  KETONESUR NEGATIVE  PROTEINUR NEGATIVE  UROBILINOGEN 0.2  NITRITE NEGATIVE  LEUKOCYTESUR NEGATIVE   Micro Results: Recent Results (from the past 240 hour(s))  MRSA PCR SCREENING     Status: Normal   Collection Time   03/12/12  2:02 AM  Component Value Range Status Comment   MRSA by PCR NEGATIVE  NEGATIVE Final    Studies/Results: No results found. Scheduled Meds:    . albuterol  2.5 mg Nebulization Q4H WA  . aspirin EC  81 mg Oral Daily  . budesonide-formoterol  2 puff Inhalation BID  . enoxaparin (LOVENOX) injection  40 mg Subcutaneous Q24H  . guaiFENesin  1,200 mg Oral BID  . ipratropium  0.5 mg Nebulization Q4H WA  . levofloxacin  750 mg Oral Daily  . methylPREDNISolone (SOLU-MEDROL) injection  60 mg Intravenous Q12H  . nicotine  21 mg Transdermal Daily  . omega-3 acid ethyl esters  1 g Oral Daily  . pantoprazole  80 mg Oral Daily  . [COMPLETED] pneumococcal 23 valent vaccine  0.5 mL  Intramuscular Tomorrow-1000  . simvastatin  40 mg Oral Daily   Continuous Infusions:  PRN Meds:.acetaminophen, albuterol, ALPRAZolam, bisacodyl, HYDROmorphone (DILAUDID) injection, ondansetron (ZOFRAN) IV, traZODone Assessment/Plan: Principal Problem:  *Acute and chronic respiratory failure (acute-on-chronic): Slightly improved today. Will increase activity as tolerated. Change steroids to by mouth. Continue other management. Continue step down monitoring Active Problems:  COPD exacerbation  Tobacco abuse, will have nursing staff provide tobacco cessation counseling.  Anxiety: Change Xanax to 1 mg every 8 hours as needed.  Hyperlipidemia   LOS: 3 days   [name] [name] 03/14/2012, 8:44 AM

## 2012-03-15 MED ORDER — NICOTINE 14 MG/24HR TD PT24
14.0000 mg | MEDICATED_PATCH | Freq: Every day | TRANSDERMAL | Status: DC
  Administered 2012-03-15 – 2012-03-17 (×3): 14 mg via TRANSDERMAL
  Filled 2012-03-15 (×3): qty 1

## 2012-03-15 NOTE — Progress Notes (Signed)
Pt not on bipap  

## 2012-03-15 NOTE — Progress Notes (Signed)
Pt transfering to room 311. Pt alert and oriented. Breath sounds are diminished. No wheezing at this time. o2 in progress at 3.5l/min via [place]. Rt forearm nsl patent.headache is beginning to subside.transfer report given to [facility] on 300. Transferred via w/c

## 2012-03-15 NOTE — Progress Notes (Signed)
Off BiPAP for over 36 hours.  Subjective: Feels better. Was able to sit in the chair. Believes the nicotine patches giving him nightmares and requesting it be decreased to 14 mg daily. Has not ambulated much.  Objective: Vital signs in last 24 hours: Filed Vitals:   03/15/12 0600 03/15/12 0700 03/15/12 0714 03/15/12 0800  BP: 120/67 125/61  126/85  Pulse: 56 59  101  Temp:    97.9 F (36.6 C)  TempSrc:    Oral  Resp: 25 21  25   Height:      Weight:      SpO2: 96% 96% 96% 93%   Weight change:   Intake/Output Summary (Last 24 hours) at 03/15/12 0844 Last data filed at 03/15/12 0700  Gross per 24 hour  Intake   2040 ml  Output      0 ml  Net   2040 ml   General: Appears more comfortable. Breathing easy. Visiting with family. Alert and oriented. Lungs: Better air movement today. Diminished throughout. Prolonged expiratory phase. No rhonchi or rales or wheeze. Cardiovascular regular rate rhythm without murmurs gallops rubs Abdomen soft nontender nondistended Extremities no clubbing cyanosis or edema  Lab Results: Basic Metabolic Panel:  Lab 03/12/12 8119 03/11/12 2221  NA 138 140  K 4.4 4.1  CL 96 97  CO2 34* 36*  GLUCOSE 196* 121*  BUN 11 11  CREATININE 0.64 0.88  CALCIUM 9.7 9.9  MG 2.0 --  PHOS -- --   Liver Function Tests:  Lab 03/12/12 0447 03/11/12 2221  AST 23 26  ALT 24 26  ALKPHOS 73 75  BILITOT 0.2* 0.2*  PROT 7.0 7.0  ALBUMIN 3.6 3.8   No results found for this basename: LIPASE:2,AMYLASE:2 in the last 168 hours No results found for this basename: AMMONIA:2 in the last 168 hours CBC:  Lab 03/12/12 0447 03/11/12 2221  WBC 5.1 8.5  NEUTROABS -- 5.5  HGB 13.6 13.9  HCT 41.6 42.7  MCV 95.9 96.6  PLT 162 152   Cardiac Enzymes:  Lab 03/11/12 2308  CKTOTAL --  CKMB --  CKMBINDEX --  TROPONINI <0.30   BNP:  Lab 03/11/12 2308  PROBNP 77.4   D-Dimer: No results found for this basename: DDIMER:2 in the last 168 hours CBG: No results  found for this basename: GLUCAP:6 in the last 168 hours Hemoglobin A1C:  Lab 03/12/12 0146  HGBA1C 6.2*   Urinalysis:  Lab 03/12/12 0354  COLORURINE YELLOW  LABSPEC 1.020  PHURINE 6.0  GLUCOSEU NEGATIVE  HGBUR NEGATIVE  BILIRUBINUR NEGATIVE  KETONESUR NEGATIVE  PROTEINUR NEGATIVE  UROBILINOGEN 0.2  NITRITE NEGATIVE  LEUKOCYTESUR NEGATIVE   Micro Results: Recent Results (from the past 240 hour(s))  MRSA PCR SCREENING     Status: Normal   Collection Time   03/12/12  2:02 AM      Component Value Range Status Comment   MRSA by PCR NEGATIVE  NEGATIVE Final    Studies/Results: No results found. Scheduled Meds:    . albuterol  2.5 mg Nebulization Q4H WA  . aspirin EC  81 mg Oral Daily  . budesonide-formoterol  2 puff Inhalation BID  . enoxaparin (LOVENOX) injection  40 mg Subcutaneous Q24H  . guaiFENesin  1,200 mg Oral BID  . ipratropium  0.5 mg Nebulization Q4H WA  . levofloxacin  750 mg Oral Daily  . nicotine  21 mg Transdermal Daily  . omega-3 acid ethyl esters  1 g Oral Daily  . pantoprazole  80  mg Oral Daily  . predniSONE  40 mg Oral BID  . simvastatin  40 mg Oral Daily  . [DISCONTINUED] methylPREDNISolone (SOLU-MEDROL) injection  60 mg Intravenous Q12H   Continuous Infusions:  PRN Meds:.acetaminophen, albuterol, ALPRAZolam, bisacodyl, HYDROcodone-acetaminophen, HYDROmorphone (DILAUDID) injection, ondansetron (ZOFRAN) IV, traZODone, [DISCONTINUED] ALPRAZolam, [DISCONTINUED] traZODone Assessment/Plan: Principal Problem:  *Acute and chronic respiratory failure (acute-on-chronic): Transferred to MedSurg. Increase activity. Continue steroids. Last day of levofloxacin should be today. Active Problems:  COPD exacerbation improving  Tobacco abuse, will have nursing staff provide tobacco cessation counseling. Decreased nicotine patch to 14 mg per patient request.  Anxiety: Change Xanax to 1 mg every 8 hours as needed.  Hyperlipidemia   LOS: 4 days   [name] [name]  L 03/15/2012, 8:44 AM

## 2012-03-16 LAB — BASIC METABOLIC PANEL
BUN: 20 mg/dL (ref 6–23)
CO2: 31 mEq/L (ref 19–32)
Calcium: 10.1 mg/dL (ref 8.4–10.5)
Glucose, Bld: 147 mg/dL — ABNORMAL HIGH (ref 70–99)
Sodium: 142 mEq/L (ref 135–145)

## 2012-03-16 LAB — CBC
Hemoglobin: 14.9 g/dL (ref 13.0–17.0)
MCH: 31.1 pg (ref 26.0–34.0)
MCV: 92.7 fL (ref 78.0–100.0)
RBC: 4.79 MIL/uL (ref 4.22–5.81)
RDW: 13.3 % (ref 11.5–15.5)

## 2012-03-16 NOTE — Progress Notes (Signed)
Pt provided with information on smoking cessation.  Pt stated that I could "put it in the trash".  Nurse left the information with the patient, in case he changed his mind.  Pt stated "ok".  Nurse asked patient about ambulating in hallway before lunch.  Pt stated that he preferred to wait until after lunch to ambulate.  Nurse stated that she would check back with patient after lunch to ambulate in hallway.

## 2012-03-16 NOTE — Care Management Note (Signed)
    Page 1 of 1   03/17/2012     2:07:28 PM   CARE MANAGEMENT NOTE 03/17/2012  Patient:  [name] [name], [name] [name]   Account Number:  [id number]  Date Initiated:  03/16/2012  Documentation initiated by:  Rosemary Holms  Subjective/Objective Assessment:   Pt admitted with COPD needing BIPAP. Now on telemetry and slow to progress. Lives at home with wife.     Action/Plan:   Anticipated DC Date:  03/17/2012   Anticipated DC Plan:  HOME/SELF CARE      DC Planning Services  CM consult      Choice offered to / List presented to:  C-3 Spouse        HH arranged  HH-1 RN  HH-10 DISEASE MANAGEMENT  HH-2 PT      HH agency  [facility]   Status of service:  Completed, signed off Medicare Important Message given?  YES (If response is "NO", the following Medicare IM given date fields will be blank) Date Medicare IM given:  03/16/2012 Date Additional Medicare IM given:    Discharge Disposition:  HOME W HOME HEALTH SERVICES  Per UR Regulation:    If discussed at Long Length of Stay Meetings, dates discussed:   03/17/2012    Comments:  03/17/12 Rosemary Holms RN BSN CM Verified with pt that he still wants [facility] PT and RN. [facility] notified  03/16/12 1410 Jaquelinne Glendening Leanord Hawking RN BSN CM Spoke to significant other who selected [facility] for RN and PT. Pt states he is not ready for DC. Was able to walk some but requests additional assistance at DC

## 2012-03-16 NOTE — Progress Notes (Addendum)
Subjective: Shortness of breath continues to improve slowly, but does not yet feel well enough to go home. He has not yet felt well enough to ambulate in the hallway. She reports that he is usually fairly ambulatory with oxygen at home. Nightmares have improved on a lower dose of nicotine patch.  Objective: Vital signs in last 24 hours: Filed Vitals:   03/16/12 0205 03/16/12 0358 03/16/12 0723 03/16/12 1130  BP: 128/71 120/83    Pulse: 63 77    Temp: 98.5 F (36.9 C) 98.1 F (36.7 C)    TempSrc: Oral Oral    Resp: 20 20    Height:      Weight:  72.3 kg (159 lb 6.3 oz)    SpO2: 95% 95% 94% 94%   Weight change:   Intake/Output Summary (Last 24 hours) at 03/16/12 1248 Last data filed at 03/16/12 0800  Gross per 24 hour  Intake    960 ml  Output    425 ml  Net    535 ml   General: Fairly comfortable sitting in a chair. Does get slightly tachypneic with speaking, appears occasionally anxious. Lungs: Moderate air movement. Diminished throughout. Prolonged expiratory phase. No rhonchi or rales. Mild wheeze Cardiovascular regular rate rhythm without murmurs gallops rubs Abdomen soft nontender nondistended Extremities no clubbing cyanosis or edema  Lab Results: Basic Metabolic Panel:  Lab 03/16/12 1610 03/12/12 0447  NA 142 138  K 3.8 4.4  CL 100 96  CO2 31 34*  GLUCOSE 147* 196*  BUN 20 11  CREATININE 0.79 0.64  CALCIUM 10.1 9.7  MG -- 2.0  PHOS -- --   Liver Function Tests:  Lab 03/12/12 0447 03/11/12 2221  AST 23 26  ALT 24 26  ALKPHOS 73 75  BILITOT 0.2* 0.2*  PROT 7.0 7.0  ALBUMIN 3.6 3.8   No results found for this basename: LIPASE:2,AMYLASE:2 in the last 168 hours No results found for this basename: AMMONIA:2 in the last 168 hours CBC:  Lab 03/16/12 0511 03/12/12 0447 03/11/12 2221  WBC 15.0* 5.1 --  NEUTROABS -- -- 5.5  HGB 14.9 13.6 --  HCT 44.4 41.6 --  MCV 92.7 95.9 --  PLT 217 162 --   Cardiac Enzymes:  Lab 03/11/12 2308  CKTOTAL --    CKMB --  CKMBINDEX --  TROPONINI <0.30   BNP:  Lab 03/11/12 2308  PROBNP 77.4   D-Dimer: No results found for this basename: DDIMER:2 in the last 168 hours CBG: No results found for this basename: GLUCAP:6 in the last 168 hours Hemoglobin A1C:  Lab 03/12/12 0146  HGBA1C 6.2*   Urinalysis:  Lab 03/12/12 0354  COLORURINE YELLOW  LABSPEC 1.020  PHURINE 6.0  GLUCOSEU NEGATIVE  HGBUR NEGATIVE  BILIRUBINUR NEGATIVE  KETONESUR NEGATIVE  PROTEINUR NEGATIVE  UROBILINOGEN 0.2  NITRITE NEGATIVE  LEUKOCYTESUR NEGATIVE   Micro Results: Recent Results (from the past 240 hour(s))  MRSA PCR SCREENING     Status: Normal   Collection Time   03/12/12  2:02 AM      Component Value Range Status Comment   MRSA by PCR NEGATIVE  NEGATIVE Final    Studies/Results: No results found. Scheduled Meds:    . albuterol  2.5 mg Nebulization Q4H WA  . aspirin EC  81 mg Oral Daily  . budesonide-formoterol  2 puff Inhalation BID  . enoxaparin (LOVENOX) injection  40 mg Subcutaneous Q24H  . guaiFENesin  1,200 mg Oral BID  . ipratropium  0.5  mg Nebulization Q4H WA  . [COMPLETED] levofloxacin  750 mg Oral Daily  . nicotine  14 mg Transdermal Daily  . omega-3 acid ethyl esters  1 g Oral Daily  . pantoprazole  80 mg Oral Daily  . predniSONE  40 mg Oral BID  . simvastatin  40 mg Oral Daily   Continuous Infusions:  PRN Meds:.acetaminophen, albuterol, ALPRAZolam, bisacodyl, HYDROcodone-acetaminophen, HYDROmorphone (DILAUDID) injection, ondansetron (ZOFRAN) IV, traZODone Assessment/Plan: Principal Problem:  *Acute and chronic respiratory failure (acute-on-chronic): Patient has completed a course of levofloxacin. Continue steroids, bronchodilators. Encouraged patient to ambulate in the hall today with oxygen. Hopefully, if patient continues to improve, home in a day or 2. Active Problems:  COPD exacerbation improving  Tobacco abuse, will have nursing staff provide tobacco cessation counseling.  Decreased nicotine patch to 14 mg per patient request. Patient interested in quitting.  Anxiety: Seems to play a part in his dyspnea. Continue Xanax as needed. . Hyperlipidemia Will consult PT for home needs.   LOS: 5 days   Ayson Cherubini L 03/16/2012, 12:48 PM

## 2012-03-17 MED ORDER — PREDNISONE 20 MG PO TABS: ORAL_TABLET | ORAL | Status: DC

## 2012-03-17 MED ORDER — NICOTINE 14 MG/24HR TD PT24: 1.0000 | MEDICATED_PATCH | Freq: Every day | TRANSDERMAL | Status: DC

## 2012-03-17 NOTE — Progress Notes (Signed)
Pt discharged home today per Dr. Karilyn Cota. Pt's IV site D/C'd and WNL. Pt's VS stable at this time. Pt provided with home medication list, discharge instructions and prescriptions. Pt verbalized understanding. Pt left floor via WC in stable condition accompanied by NT.

## 2012-03-17 NOTE — Discharge Summary (Signed)
Physician Discharge Summary  [name] [name] GNF:[id number] DOB: [birth year] DOA: 03/11/2012  PCP: Remus Loffler, PA  Admit date: 03/11/2012 Discharge date: 03/17/2012  Time spent: Greater than 30 minutes  Recommendations for Outpatient Follow-up:  1. Follow with primary care physician in a week or 2.   Discharge Diagnoses:  1. Acute COPD exacerbation, improved. 2. Tobacco abuse. 3. Anxiety.   Discharge Condition: Stable and improved.  Diet recommendation: Regular.  Filed Weights   03/14/12 0500 03/16/12 0358 03/17/12 0621  Weight: 71.9 kg (158 lb 8.2 oz) 72.3 kg (159 lb 6.3 oz) 70.761 kg (156 lb)    History of present illness:  This 71 year old anxious man came to the hospital with symptoms of aggressive dyspnea for 2 weeks. Please see initial history as outlined below: [name] [name] is an 71 y.o. male. Obese Caucasian gentleman with known COPD and and chronic 2 pack per day smoker, maintained on home oxygen and home nebulizer, has been having worsening cough productive of white-yellow sputum for the past 2 weeks. Not relieved by home nebulizers, and has been getting progressively. Fell at home today and did not sustain any injuries but was simply too weak to get up unaided because of his progressive. About 12 hours later he was persuaded to allow EMS to the called to be evaluated for his persistent cough and dyspnea. He received treatment for COPD exacerbation but by the time of arrival in the emergency room he required BiPAP for stabilization and continues to wheeze despite nebulizations and steroids..  He has been having chills but has not measured a fever. Denies frequency or dysuria denies nausea vomiting diarrhea constipation or jaundice.  Hospital Course:  The patient was admitted and started on intravenous steroids and antibiotics as well as bronchodilators. He has made a slow and gradual improvement. I think there is a significant anxiety component to his dyspnea. He is now  back at his baseline oxygen requirement which he has at home. He feels stronger and is keen to go home. He has finished a course of antibiotics and remains on oral  steroids. He will need a tapering course of steroids. He is very keen to quit smoking now. There is no evidence of pneumonia on chest x-ray or clinically.  Procedures:  None.   Consultations:  None.  Discharge Exam: Filed Vitals:   03/17/12 0125 03/17/12 0621 03/17/12 0734 03/17/12 1018  BP: 108/70 118/77  129/81  Pulse: 71 76  95  Temp: 98.2 F (36.8 C) 97.2 F (36.2 C)  97.9 F (36.6 C)  TempSrc: Oral Oral  Oral  Resp: 19 20  20   Height:      Weight:  70.761 kg (156 lb)    SpO2: 93% 95% 94% 93%    General: He looks systemically well. There is no increased work of breathing. There is no peripheral central cyanosis. Cardiovascular: Heart sounds are present without gallop rhythm. Respiratory: Lung fields are clear with scattered wheezing which is probably his baseline. There is no bronchial breathing or crackles. He is alert and oriented.  Discharge Instructions  Discharge Orders    Future Orders Please Complete By Expires   Diet - low sodium heart healthy      Increase activity slowly          Medication List     As of 03/17/2012 10:19 AM    TAKE these medications         albuterol (2.5 MG/3ML) 0.083% nebulizer solution   Commonly known  as: PROVENTIL   Take 2.5 mg by nebulization every 6 (six) hours as needed. For shortness of breath      albuterol-ipratropium 18-103 MCG/ACT inhaler   Commonly known as: COMBIVENT   Inhale 2 puffs into the lungs 4 (four) times daily.      ALPRAZolam 1 MG tablet   Commonly known as: XANAX   Take 1 mg by mouth 2 (two) times daily as needed. anxiety      aspirin EC 81 MG tablet   Take 81 mg by mouth daily.      budesonide-formoterol 160-4.5 MCG/ACT inhaler   Commonly known as: SYMBICORT   Inhale 2 puffs into the lungs 2 (two) times daily.      CENTRUM tablet    Take 1 tablet by mouth daily.      Garlic 1000 MG Caps   Take 1 capsule by mouth daily.      nicotine 14 mg/24hr patch   Commonly known as: NICODERM CQ - dosed in mg/24 hours   Place 1 patch onto the skin daily.      omega-3 fish oil 1000 MG Caps capsule   Commonly known as: MAXEPA   Take 1 capsule by mouth daily.      omeprazole 20 MG capsule   Commonly known as: PRILOSEC   Take 20 mg by mouth 2 (two) times daily.      predniSONE 20 MG tablet   Commonly known as: DELTASONE   Take 2 tablets daily for 3 days, then 1 tablet daily for 3 days, then half tablet daily for 3 days, then STOP.      selenium sulfide 2.5 % shampoo   Commonly known as: SELSUN   Apply topically daily. Apply locally, leave on for one hour, then rinse off.  Repeat daily until gone.      simvastatin 40 MG tablet   Commonly known as: ZOCOR   Take 40 mg by mouth daily.      Vitamin D3 1000 UNITS Caps   Take 1 capsule by mouth daily.          The results of significant diagnostics from this hospitalization (including imaging, microbiology, ancillary and laboratory) are listed below for reference.    Significant Diagnostic Studies: Dg Chest Portable 1 View  03/11/2012  *RADIOLOGY REPORT*  Clinical Data: Shortness of breath.  PORTABLE CHEST - 1 VIEW  Comparison: Chest radiograph performed 07/04/2011  Findings: The lungs are well-aerated.  Mild chronic vascular congestion is noted, with superimposed chronic interstitial lung changes.  This is grossly unchanged from the prior study.  There is no evidence of pleural effusion or pneumothorax.  The cardiomediastinal silhouette is within normal limits.  No acute osseous abnormalities are seen.  IMPRESSION: Chronic lung changes and underlying vascular congestion again noted; no acute cardiopulmonary process seen.   Original Report Authenticated By: Tonia Ghent, M.D.     Microbiology: Recent Results (from the past 240 hour(s))  MRSA PCR SCREENING     Status:  Normal   Collection Time   03/12/12  2:02 AM      Component Value Range Status Comment   MRSA by PCR NEGATIVE  NEGATIVE Final      Labs: Basic Metabolic Panel:  Lab 03/16/12 4098 03/12/12 0447 03/11/12 2221  NA 142 138 140  K 3.8 4.4 4.1  CL 100 96 97  CO2 31 34* 36*  GLUCOSE 147* 196* 121*  BUN 20 11 11   CREATININE 0.79 0.64 0.88  CALCIUM 10.1 9.7  9.9  MG -- 2.0 --  PHOS -- -- --   Liver Function Tests:  Lab 03/12/12 0447 03/11/12 2221  AST 23 26  ALT 24 26  ALKPHOS 73 75  BILITOT 0.2* 0.2*  PROT 7.0 7.0  ALBUMIN 3.6 3.8     CBC:  Lab 03/16/12 0511 03/12/12 0447 03/11/12 2221  WBC 15.0* 5.1 8.5  NEUTROABS -- -- 5.5  HGB 14.9 13.6 13.9  HCT 44.4 41.6 42.7  MCV 92.7 95.9 96.6  PLT 217 162 152   Cardiac Enzymes:  Lab 03/11/12 2308  CKTOTAL --  CKMB --  CKMBINDEX --  TROPONINI <0.30   BNP: BNP (last 3 results)  Basename 03/11/12 2308  PROBNP 77.4         Signed:  GOSRANI,NIMISH C  [facility] 03/17/2012, 10:19 AM

## 2012-10-18 ENCOUNTER — Emergency Department (HOSPITAL_COMMUNITY): Payer: Medicare Other

## 2012-10-18 ENCOUNTER — Encounter (HOSPITAL_COMMUNITY): Payer: Self-pay | Admitting: *Deleted

## 2012-10-18 ENCOUNTER — Emergency Department (HOSPITAL_COMMUNITY)
Admission: EM | Admit: 2012-10-18 | Discharge: 2012-10-18 | Disposition: A | Discharge status: Home / Self Care | Payer: Medicare Other | Attending: Emergency Medicine | Admitting: Emergency Medicine

## 2012-10-18 DIAGNOSIS — K219 Gastro-esophageal reflux disease without esophagitis: Secondary | ICD-10-CM | POA: Insufficient documentation

## 2012-10-18 DIAGNOSIS — R05 Cough: Secondary | ICD-10-CM | POA: Insufficient documentation

## 2012-10-18 DIAGNOSIS — H539 Unspecified visual disturbance: Secondary | ICD-10-CM | POA: Insufficient documentation

## 2012-10-18 DIAGNOSIS — Z79899 Other long term (current) drug therapy: Secondary | ICD-10-CM | POA: Insufficient documentation

## 2012-10-18 DIAGNOSIS — Z87448 Personal history of other diseases of urinary system: Secondary | ICD-10-CM | POA: Insufficient documentation

## 2012-10-18 DIAGNOSIS — Z87891 Personal history of nicotine dependence: Secondary | ICD-10-CM | POA: Insufficient documentation

## 2012-10-18 DIAGNOSIS — Z8669 Personal history of other diseases of the nervous system and sense organs: Secondary | ICD-10-CM | POA: Insufficient documentation

## 2012-10-18 DIAGNOSIS — F411 Generalized anxiety disorder: Secondary | ICD-10-CM | POA: Insufficient documentation

## 2012-10-18 DIAGNOSIS — IMO0002 Reserved for concepts with insufficient information to code with codable children: Secondary | ICD-10-CM | POA: Insufficient documentation

## 2012-10-18 DIAGNOSIS — E785 Hyperlipidemia, unspecified: Secondary | ICD-10-CM | POA: Insufficient documentation

## 2012-10-18 DIAGNOSIS — J45901 Unspecified asthma with (acute) exacerbation: Secondary | ICD-10-CM | POA: Insufficient documentation

## 2012-10-18 DIAGNOSIS — J441 Chronic obstructive pulmonary disease with (acute) exacerbation: Secondary | ICD-10-CM | POA: Insufficient documentation

## 2012-10-18 DIAGNOSIS — Z7982 Long term (current) use of aspirin: Secondary | ICD-10-CM | POA: Insufficient documentation

## 2012-10-18 DIAGNOSIS — R079 Chest pain, unspecified: Secondary | ICD-10-CM | POA: Insufficient documentation

## 2012-10-18 DIAGNOSIS — Z9889 Other specified postprocedural states: Secondary | ICD-10-CM | POA: Insufficient documentation

## 2012-10-18 DIAGNOSIS — E7800 Pure hypercholesterolemia, unspecified: Secondary | ICD-10-CM | POA: Insufficient documentation

## 2012-10-18 DIAGNOSIS — G8929 Other chronic pain: Secondary | ICD-10-CM | POA: Insufficient documentation

## 2012-10-18 DIAGNOSIS — R61 Generalized hyperhidrosis: Secondary | ICD-10-CM | POA: Insufficient documentation

## 2012-10-18 DIAGNOSIS — R059 Cough, unspecified: Secondary | ICD-10-CM | POA: Insufficient documentation

## 2012-10-18 MED ORDER — ALBUTEROL SULFATE (5 MG/ML) 0.5% IN NEBU
2.5000 mg | INHALATION_SOLUTION | Freq: Once | RESPIRATORY_TRACT | Status: AC
  Administered 2012-10-18: 2.5 mg via RESPIRATORY_TRACT
  Filled 2012-10-18: qty 0.5

## 2012-10-18 MED ORDER — IPRATROPIUM BROMIDE 0.02 % IN SOLN
0.5000 mg | Freq: Once | RESPIRATORY_TRACT | Status: AC
  Administered 2012-10-18: 0.5 mg via RESPIRATORY_TRACT
  Filled 2012-10-18: qty 2.5

## 2012-10-18 MED ORDER — PREDNISONE 10 MG PO TABS: 40.0000 mg | ORAL_TABLET | Freq: Every day | ORAL | Status: DC

## 2012-10-18 MED ORDER — PREDNISONE 50 MG PO TABS
60.0000 mg | ORAL_TABLET | Freq: Once | ORAL | Status: AC
  Administered 2012-10-18: 60 mg via ORAL
  Filled 2012-10-18: qty 1

## 2012-10-18 NOTE — ED Provider Notes (Signed)
History    CSN: [id number] Arrival date & time 10/18/12  2023  First MD Initiated Contact with Patient 10/18/12 2152     Chief Complaint  Patient presents with  . Shortness of Breath   (Consider location/radiation/quality/duration/timing/severity/associated sxs/prior Treatment) HPI HPI Comments: [name] [name] is a 72 y.o. male who presents to the Emergency Department complaining of SOB onset of today. O2 levels little. Pt reports NP cough. Pt uses albuterol when at home and had a dose this morning when symptoms presented.  Per relative, uses Combivent and symbicort. Pt reports having a dose and feeling relief . Pt hx of sym* and f   Dr Yetta Barre of [facility] [place]  Past Medical History  Diagnosis Date  . COPD (chronic obstructive pulmonary disease)   . Asthma   . Tobacco abuse 09/29/2011  . Hypercholesteremia   . GERD (gastroesophageal reflux disease)   . Headache(784.0)   . BPH (benign prostatic hyperplasia) 10/01/2011    Per cystoscopy  . 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  . Pulmonary nodule 09/2011    Stable appearance  . Chronic back pain   . Chronic anxiety   . Hyperlipidemia 03/13/2012  . Anxiety 03/13/2012   Past Surgical History  Procedure Laterality Date  . Cardiac catheterization  2003    [facility]  . Fracture surgery      R ring finger, pin placed  . Cystoscopy  09/30/2011    Procedure: CYSTOSCOPY FLEXIBLE;  Surgeon: Ky Barban, MD;  Location: [facility];  Service: Urology;  Laterality: N/A;  . Transurethral resection of bladder tumor  10/01/2011    Procedure: TRANSURETHRAL RESECTION OF BLADDER TUMOR (TURBT);  Surgeon: Ky Barban, MD;  Location: [facility];  Service: Urology;  Laterality: N/A;   Family History  Problem Relation Age of Onset  . Emphysema Brother     was not close to family; does not truly know their medical problems.  . Emphysema Brother   . Emphysema Brother   . Cancer Neg Hx    History  Substance Use Topics  .  Smoking status: Former Smoker -- 1.00 packs/day for 58 years    Types: Cigarettes    Quit date: 07/04/2011  . Smokeless tobacco: Former Neurosurgeon  . Alcohol Use: No    Review of Systems  Constitutional: Negative for fever.  HENT: Negative for congestion and rhinorrhea.   Eyes: Positive for visual disturbance.  Respiratory: Positive for cough (intermitten film).   Cardiovascular: Positive for chest pain.  Gastrointestinal: Negative for vomiting, abdominal pain, constipation, blood in stool and rectal pain.  Skin: Negative for rash.  Neurological: Negative for dizziness and headaches.  Hematological: Bruises/bleeds easily (bruises easily).    Allergies  Iohexol  Home Medications   Current Outpatient Rx  Name  Route  Sig  Dispense  Refill  . albuterol (PROVENTIL) (2.5 MG/3ML) 0.083% nebulizer solution   Nebulization   Take 2.5 mg by nebulization every 6 (six) hours as needed. For shortness of breath          . albuterol-ipratropium (COMBIVENT) 18-103 MCG/ACT inhaler   Inhalation   Inhale 2 puffs into the lungs 4 (four) times daily.           Marland Kitchen ALPRAZolam (XANAX) 1 MG tablet   Oral   Take 1 mg by mouth 2 (two) times daily as needed. anxiety         . aspirin EC 81 MG tablet   Oral   Take  81 mg by mouth daily.         . budesonide-formoterol (SYMBICORT) 160-4.5 MCG/ACT inhaler   Inhalation   Inhale 2 puffs into the lungs 2 (two) times daily.           . Cholecalciferol (VITAMIN D3) 1000 UNITS CAPS   Oral   Take 1 capsule by mouth daily.           . Garlic 1000 MG CAPS   Oral   Take 1 capsule by mouth daily.          Marland Kitchen HYDROcodone-acetaminophen (NORCO) 10-325 MG per tablet   Oral   Take 1 tablet by mouth 4 (four) times daily as needed for pain.         . Multiple Vitamins-Minerals (CENTRUM) tablet   Oral   Take 1 tablet by mouth daily.          Marland Kitchen omega-3 fish oil (MAXEPA) 1000 MG CAPS capsule   Oral   Take 1 capsule by mouth daily.            Marland Kitchen omeprazole (PRILOSEC) 20 MG capsule   Oral   Take 20 mg by mouth 2 (two) times daily.           . simvastatin (ZOCOR) 40 MG tablet   Oral   Take 40 mg by mouth daily.          . predniSONE (DELTASONE) 10 MG tablet   Oral   Take 4 tablets (40 mg total) by mouth daily.   20 tablet   0    BP 123/84  Pulse 83  Temp(Src) 97.8 F (36.6 C) (Oral)  Resp 22  Ht 5\' 5"  (1.651 m)  Wt 160 lb (72.576 kg)  BMI 26.63 kg/m2  SpO2 93% Physical Exam  Constitutional: He is oriented to person, place, and time. He appears well-developed and well-nourished. He appears distressed.  HENT:  Head: Normocephalic and atraumatic.  Right Ear: External ear normal.  Left Ear: External ear normal.  Eyes: EOM are normal. Pupils are equal, round, and reactive to light.  Neck: Normal range of motion. Neck supple.  Cardiovascular: Normal rate, regular rhythm and normal heart sounds.   Pulmonary/Chest: He has wheezes (bilateral).  Stats varies between 86-91 on 2L of O2  Abdominal: Soft. Bowel sounds are normal. There is no tenderness.  Musculoskeletal: Normal range of motion. He exhibits no edema.  Neurological: He is alert and oriented to person, place, and time. No cranial nerve deficit. He exhibits normal muscle tone. Coordination normal.  Skin: Skin is warm. No rash noted. He is diaphoretic.  Psychiatric: He has a normal mood and affect.    ED Course  Procedures (including critical care time) Labs Reviewed - No data to display Dg Chest Portable 1 View  10/18/2012   *RADIOLOGY REPORT*  Clinical Data: Shortness of breath.  Hypertension.  COPD. Pulmonary nodules.  Acute and chronic respiratory failure.  PORTABLE CHEST - 1 VIEW  Comparison: 03/11/2012  Findings: Borderline heart size and pulmonary vascularity.  Diffuse interstitial changes throughout the lungs is similar to previous study and probably represents chronic fibrosis.  Scattered calcified granulomas in the lungs.  No definite evidence of  any superimposed consolidation.  No blunting of costophrenic angles. No pneumothorax.  Stable appearance since previous study.  IMPRESSION: Diffuse pulmonary fibrosis, stable since previous study.  No definite acute changes.   Original Report Authenticated By: Burman Nieves, M.D.   1. COPD exacerbation     MDM  After second albuterol Atrovent breathing treatment. Patient's wheezing completely resolved. Patient satting in the 90s on his 2 L of oxygen. Patient feels much better. Patient started on prednisone here tonight given 60 mg. Will continue on prednisone for the next 5 days. Will use his albuterol Combivent inhaler every 6 hours for the next 7 days. Followup with his regular Dr. if not better in a few days to return here for any new or worse symptoms or any worse breathing problems. Patient is nontoxic no acute distress at discharge. Patient feeling much better.  Shelda Jakes, MD 10/18/12 2322

## 2012-10-18 NOTE — Progress Notes (Signed)
Decreased PT's Oxygen to 3 lpm/[place] from 4 as pt has a history of high PCO2. Pt appears to have been smoking.

## 2012-10-18 NOTE — ED Notes (Signed)
Pt reporting increased SOB today.  Reports he has used home breathing treatments with no relief.  States he was previously on 2 LPM Oxygen, but increased it to 4 LPM due to SOB.

## 2012-10-18 NOTE — ED Notes (Signed)
Pt with SOB all day, per wife states sats was 80% at home, pt admits to increasing his home O2 from 2 L/M to 4 L/M, denies pain

## 2013-02-16 ENCOUNTER — Inpatient Hospital Stay (HOSPITAL_COMMUNITY)
Admission: EM | Admit: 2013-02-16 | Discharge: 2013-02-20 | DRG: 871 | Disposition: A | Discharge status: Home Health | Payer: Medicare Other | Attending: Internal Medicine | Admitting: Internal Medicine

## 2013-02-16 ENCOUNTER — Emergency Department (HOSPITAL_COMMUNITY): Payer: Medicare Other

## 2013-02-16 ENCOUNTER — Encounter (HOSPITAL_COMMUNITY): Payer: Self-pay | Admitting: General Practice

## 2013-02-16 DIAGNOSIS — Z8559 Personal history of malignant neoplasm of other urinary tract organ: Secondary | ICD-10-CM

## 2013-02-16 DIAGNOSIS — Z720 Tobacco use: Secondary | ICD-10-CM

## 2013-02-16 DIAGNOSIS — D494 Neoplasm of unspecified behavior of bladder: Secondary | ICD-10-CM

## 2013-02-16 DIAGNOSIS — K219 Gastro-esophageal reflux disease without esophagitis: Secondary | ICD-10-CM | POA: Diagnosis present

## 2013-02-16 DIAGNOSIS — E785 Hyperlipidemia, unspecified: Secondary | ICD-10-CM | POA: Diagnosis present

## 2013-02-16 DIAGNOSIS — Z9981 Dependence on supplemental oxygen: Secondary | ICD-10-CM

## 2013-02-16 DIAGNOSIS — F419 Anxiety disorder, unspecified: Secondary | ICD-10-CM

## 2013-02-16 DIAGNOSIS — J441 Chronic obstructive pulmonary disease with (acute) exacerbation: Secondary | ICD-10-CM | POA: Diagnosis present

## 2013-02-16 DIAGNOSIS — E7800 Pure hypercholesterolemia, unspecified: Secondary | ICD-10-CM | POA: Diagnosis present

## 2013-02-16 DIAGNOSIS — J189 Pneumonia, unspecified organism: Secondary | ICD-10-CM | POA: Diagnosis present

## 2013-02-16 DIAGNOSIS — J4489 Other specified chronic obstructive pulmonary disease: Secondary | ICD-10-CM | POA: Diagnosis present

## 2013-02-16 DIAGNOSIS — J449 Chronic obstructive pulmonary disease, unspecified: Secondary | ICD-10-CM | POA: Diagnosis present

## 2013-02-16 DIAGNOSIS — J9620 Acute and chronic respiratory failure, unspecified whether with hypoxia or hypercapnia: Secondary | ICD-10-CM | POA: Diagnosis present

## 2013-02-16 DIAGNOSIS — F411 Generalized anxiety disorder: Secondary | ICD-10-CM

## 2013-02-16 DIAGNOSIS — I10 Essential (primary) hypertension: Secondary | ICD-10-CM | POA: Diagnosis present

## 2013-02-16 DIAGNOSIS — A419 Sepsis, unspecified organism: Principal | ICD-10-CM | POA: Diagnosis present

## 2013-02-16 DIAGNOSIS — Z87891 Personal history of nicotine dependence: Secondary | ICD-10-CM

## 2013-02-16 DIAGNOSIS — N400 Enlarged prostate without lower urinary tract symptoms: Secondary | ICD-10-CM | POA: Diagnosis present

## 2013-02-16 DIAGNOSIS — M1990 Unspecified osteoarthritis, unspecified site: Secondary | ICD-10-CM | POA: Diagnosis present

## 2013-02-16 LAB — BASIC METABOLIC PANEL
CO2: 29 mEq/L (ref 19–32)
Calcium: 9.8 mg/dL (ref 8.4–10.5)
Chloride: 95 mEq/L — ABNORMAL LOW (ref 96–112)
Creatinine, Ser: 0.9 mg/dL (ref 0.50–1.35)
GFR calc non Af Amer: 84 mL/min — ABNORMAL LOW (ref >90)
Glucose, Bld: 129 mg/dL — ABNORMAL HIGH (ref 70–99)

## 2013-02-16 LAB — BLOOD GAS, ARTERIAL
Acid-Base Excess: 2 mmol/L (ref 0.0–2.0)
Bicarbonate: 26.7 mEq/L — ABNORMAL HIGH (ref 20.0–24.0)
O2 Saturation: 94.2 %
TCO2: 23.8 mmol/L (ref 0–100)
pCO2 arterial: 47 mmHg — ABNORMAL HIGH (ref 35.0–45.0)
pH, Arterial: 7.373 (ref 7.350–7.450)
pO2, Arterial: 71.3 mmHg — ABNORMAL LOW (ref 80.0–100.0)

## 2013-02-16 LAB — CBC
MCH: 31.7 pg (ref 26.0–34.0)
MCHC: 33.4 g/dL (ref 30.0–36.0)
MCV: 95 fL (ref 78.0–100.0)
Platelets: 187 10*3/uL (ref 150–400)
RBC: 4.41 MIL/uL (ref 4.22–5.81)
RDW: 14.2 % (ref 11.5–15.5)
WBC: 22.7 10*3/uL — ABNORMAL HIGH (ref 4.0–10.5)

## 2013-02-16 LAB — TROPONIN I: Troponin I: <0.3 ng/mL (ref <0.30)

## 2013-02-16 MED ORDER — METHYLPREDNISOLONE SODIUM SUCC 125 MG IJ SOLR: 125.0000 mg | Freq: Once | INTRAMUSCULAR | Status: DC

## 2013-02-16 MED ORDER — IPRATROPIUM BROMIDE 0.02 % IN SOLN
0.5000 mg | RESPIRATORY_TRACT | Status: DC | PRN
  Administered 2013-02-16: 0.5 mg via RESPIRATORY_TRACT
  Filled 2013-02-16: qty 2.5

## 2013-02-16 MED ORDER — ONDANSETRON HCL 4 MG PO TABS: 4.0000 mg | ORAL_TABLET | Freq: Four times a day (QID) | ORAL | Status: DC | PRN

## 2013-02-16 MED ORDER — ACETAMINOPHEN 650 MG RE SUPP: 650.0000 mg | Freq: Four times a day (QID) | RECTAL | Status: DC | PRN

## 2013-02-16 MED ORDER — MORPHINE SULFATE 2 MG/ML IJ SOLN: 2.0000 mg | INTRAMUSCULAR | Status: DC | PRN

## 2013-02-16 MED ORDER — ALBUTEROL SULFATE (5 MG/ML) 0.5% IN NEBU: 2.5000 mg | INHALATION_SOLUTION | RESPIRATORY_TRACT | Status: AC | PRN

## 2013-02-16 MED ORDER — LEVOFLOXACIN IN D5W 500 MG/100ML IV SOLN
500.0000 mg | Freq: Once | INTRAVENOUS | Status: AC
  Administered 2013-02-16: 500 mg via INTRAVENOUS
  Filled 2013-02-16: qty 100

## 2013-02-16 MED ORDER — ACETAMINOPHEN 325 MG PO TABS: 650.0000 mg | ORAL_TABLET | Freq: Four times a day (QID) | ORAL | Status: DC | PRN

## 2013-02-16 MED ORDER — ALBUTEROL SULFATE (5 MG/ML) 0.5% IN NEBU: 2.5000 mg | INHALATION_SOLUTION | RESPIRATORY_TRACT | Status: DC | PRN

## 2013-02-16 MED ORDER — ALBUTEROL SULFATE (5 MG/ML) 0.5% IN NEBU
2.5000 mg | INHALATION_SOLUTION | RESPIRATORY_TRACT | Status: DC | PRN
  Administered 2013-02-16: 2.5 mg via RESPIRATORY_TRACT
  Filled 2013-02-16: qty 0.5

## 2013-02-16 MED ORDER — IPRATROPIUM BROMIDE 0.02 % IN SOLN
0.5000 mg | RESPIRATORY_TRACT | Status: DC
  Administered 2013-02-16 – 2013-02-20 (×21): 0.5 mg via RESPIRATORY_TRACT
  Filled 2013-02-16 (×21): qty 2.5

## 2013-02-16 MED ORDER — IPRATROPIUM BROMIDE 0.02 % IN SOLN
0.5000 mg | Freq: Once | RESPIRATORY_TRACT | Status: AC
  Administered 2013-02-16: 0.5 mg via RESPIRATORY_TRACT
  Filled 2013-02-16: qty 2.5

## 2013-02-16 MED ORDER — SODIUM CHLORIDE 0.9 % IJ SOLN
3.0000 mL | Freq: Two times a day (BID) | INTRAMUSCULAR | Status: DC
  Administered 2013-02-18: 3 mL via INTRAVENOUS

## 2013-02-16 MED ORDER — ACETAMINOPHEN 650 MG RE SUPP
650.0000 mg | Freq: Once | RECTAL | Status: AC
  Administered 2013-02-16: 650 mg via RECTAL

## 2013-02-16 MED ORDER — IPRATROPIUM BROMIDE 0.02 % IN SOLN: 0.5000 mg | RESPIRATORY_TRACT | Status: DC

## 2013-02-16 MED ORDER — HEPARIN SODIUM (PORCINE) 5000 UNIT/ML IJ SOLN
5000.0000 [IU] | Freq: Three times a day (TID) | INTRAMUSCULAR | Status: DC
  Administered 2013-02-16: 5000 [IU] via SUBCUTANEOUS
  Filled 2013-02-16 (×2): qty 1

## 2013-02-16 MED ORDER — ACETAMINOPHEN 650 MG RE SUPP
RECTAL | Status: AC
  Administered 2013-02-16: 650 mg via RECTAL
  Filled 2013-02-16: qty 1

## 2013-02-16 MED ORDER — ONDANSETRON HCL 4 MG/2ML IJ SOLN: 4.0000 mg | Freq: Four times a day (QID) | INTRAMUSCULAR | Status: DC | PRN

## 2013-02-16 MED ORDER — ALBUTEROL SULFATE (5 MG/ML) 0.5% IN NEBU
2.5000 mg | INHALATION_SOLUTION | RESPIRATORY_TRACT | Status: DC
  Administered 2013-02-16 – 2013-02-20 (×21): 2.5 mg via RESPIRATORY_TRACT
  Filled 2013-02-16 (×21): qty 0.5

## 2013-02-16 MED ORDER — HYDROCODONE-ACETAMINOPHEN 10-325 MG PO TABS
1.0000 | ORAL_TABLET | Freq: Four times a day (QID) | ORAL | Status: DC | PRN
  Administered 2013-02-16 – 2013-02-20 (×10): 1 via ORAL
  Filled 2013-02-16 (×10): qty 1

## 2013-02-16 MED ORDER — LEVOFLOXACIN IN D5W 500 MG/100ML IV SOLN
500.0000 mg | INTRAVENOUS | Status: DC
  Administered 2013-02-17: 500 mg via INTRAVENOUS
  Filled 2013-02-16: qty 100

## 2013-02-16 MED ORDER — ALPRAZOLAM 1 MG PO TABS
1.0000 mg | ORAL_TABLET | Freq: Two times a day (BID) | ORAL | Status: DC | PRN
  Administered 2013-02-16 – 2013-02-20 (×8): 1 mg via ORAL
  Filled 2013-02-16 (×8): qty 1

## 2013-02-16 MED ORDER — PANTOPRAZOLE SODIUM 40 MG PO TBEC
40.0000 mg | DELAYED_RELEASE_TABLET | Freq: Every day | ORAL | Status: DC
  Administered 2013-02-16 – 2013-02-20 (×5): 40 mg via ORAL
  Filled 2013-02-16 (×5): qty 1

## 2013-02-16 MED ORDER — PREDNISONE 20 MG PO TABS
60.0000 mg | ORAL_TABLET | Freq: Every day | ORAL | Status: DC
  Administered 2013-02-17: 60 mg via ORAL
  Filled 2013-02-16: qty 3

## 2013-02-16 MED ORDER — ALBUTEROL SULFATE (5 MG/ML) 0.5% IN NEBU
2.5000 mg | INHALATION_SOLUTION | Freq: Once | RESPIRATORY_TRACT | Status: AC
  Administered 2013-02-16: 2.5 mg via RESPIRATORY_TRACT
  Filled 2013-02-16: qty 0.5

## 2013-02-16 MED ORDER — SODIUM CHLORIDE 0.9 % IV SOLN
INTRAVENOUS | Status: DC
  Administered 2013-02-18: 22:00:00 via INTRAVENOUS

## 2013-02-16 MED ORDER — ASPIRIN EC 81 MG PO TBEC
81.0000 mg | DELAYED_RELEASE_TABLET | Freq: Every day | ORAL | Status: DC
  Administered 2013-02-17 – 2013-02-20 (×4): 81 mg via ORAL
  Filled 2013-02-16 (×4): qty 1

## 2013-02-16 MED ORDER — SIMVASTATIN 20 MG PO TABS
40.0000 mg | ORAL_TABLET | Freq: Every day | ORAL | Status: DC
  Administered 2013-02-16 – 2013-02-19 (×4): 40 mg via ORAL
  Filled 2013-02-16 (×4): qty 2

## 2013-02-16 NOTE — ED Notes (Signed)
Pt confused at this time. Per family the AMS is new that just started today.

## 2013-02-16 NOTE — ED Provider Notes (Signed)
CSN: [id number]     Arrival date & time 02/16/13  1315 History   This chart was scribed for Glynn Octave, MD, by Yevette Edwards, ED Scribe. This patient was seen in room [facility] and the patient's care was started at 1:50 PM.  First MD Initiated Contact with Patient 02/16/13 1338     Chief Complaint  Patient presents with  . Shortness of Breath  . Cough  . Altered Mental Status    The history is provided by the patient and a relative. No language interpreter was used.   HPI Comments: [name] [name] is a 72 y.o. male, with a h/o COPD and asthma,  who was brought in via EMS to the Emergency Department, complaining of two days of gradually-increasing SOB associated with increased confusion, a non-productive cough, fever, headache, and a decreased appetite. He denies any pain to his legs or swelling to his legs. He states he is continually on 2 liters of oxygen at home.  The pt was admitted to the hospital last year for similar symptoms. The pt recently returned from [place].  He denies any sick contacts. He is a former smoker.   Dr. Prudy Feeler is his PCP.  Past Medical History  Diagnosis Date  . COPD (chronic obstructive pulmonary disease)   . Asthma   . Tobacco abuse 09/29/2011  . Hypercholesteremia   . GERD (gastroesophageal reflux disease)   . Headache(784.0)   . BPH (benign prostatic hyperplasia) 10/01/2011    Per cystoscopy  . 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  . Pulmonary nodule 09/2011    Stable appearance  . Chronic back pain   . Chronic anxiety   . Hyperlipidemia 03/13/2012  . Anxiety 03/13/2012   Past Surgical History  Procedure Laterality Date  . Cardiac catheterization  2003    [facility]  . Fracture surgery      R ring finger, pin placed  . Cystoscopy  09/30/2011    Procedure: CYSTOSCOPY FLEXIBLE;  Surgeon: Ky Barban, MD;  Location: [facility];  Service: Urology;  Laterality: N/A;  . Transurethral resection of bladder tumor   10/01/2011    Procedure: TRANSURETHRAL RESECTION OF BLADDER TUMOR (TURBT);  Surgeon: Ky Barban, MD;  Location: [facility];  Service: Urology;  Laterality: N/A;   Family History  Problem Relation Age of Onset  . Emphysema Brother     was not close to family; does not truly know their medical problems.  . Emphysema Brother   . Emphysema Brother   . Cancer Neg Hx    History  Substance Use Topics  . Smoking status: Former Smoker -- 1.00 packs/day for 58 years    Types: Cigarettes    Quit date: 07/04/2011  . Smokeless tobacco: Former Neurosurgeon  . Alcohol Use: No    Review of Systems  Respiratory: Positive for cough and shortness of breath.   Cardiovascular: Negative for leg swelling.  Musculoskeletal: Negative for myalgias.  Neurological: Positive for headaches.    Allergies  Iohexol  Home Medications   No current outpatient prescriptions on file. Triage Vitals: BP 129/90  Pulse 111  Temp(Src) 104.5 F (40.3 C) (Rectal)  Resp 26  Ht 5\' 7"  (1.702 m)  Wt 160 lb (72.576 kg)  BMI 25.05 kg/m2  SpO2 96%  Physical Exam  Nursing note and vitals reviewed. Constitutional: He is oriented to person, place, and time. He appears well-developed and well-nourished. No distress.  HENT:  Head: Normocephalic and atraumatic.  Eyes: EOM are normal.  Neck: Neck supple. No tracheal deviation present.  Cardiovascular: Regular rhythm and normal heart sounds.   Tachycardic.   Pulmonary/Chest: No respiratory distress. He has wheezes.  Increased work at breathing with increased expiratory wheezing. Tachypnic.   Musculoskeletal: Normal range of motion. He exhibits no edema.  No lower extremity edema.   Neurological: He is alert and oriented to person, place, and time.  Skin: Skin is warm and dry.  Psychiatric: He has a normal mood and affect. His behavior is normal.    ED Course  Procedures (including critical care time)  DIAGNOSTIC STUDIES: Oxygen Saturation is 96% on room air, normal  by my interpretation.    COORDINATION OF CARE:  1:52 PM- Discussed treatment plan with patient, and the patient agreed to the plan.   Labs Review Labs Reviewed  CBC - Abnormal; Notable for the following:    WBC 22.7 (*)    All other components within normal limits  BASIC METABOLIC PANEL - Abnormal; Notable for the following:    Chloride 95 (*)    Glucose, Bld 129 (*)    GFR calc non Af Amer 84 (*)    All other components within normal limits  BLOOD GAS, ARTERIAL - Abnormal; Notable for the following:    pCO2 arterial 47.0 (*)    pO2, Arterial 71.3 (*)    Bicarbonate 26.7 (*)    All other components within normal limits  CULTURE, EXPECTORATED SPUTUM-ASSESSMENT  CULTURE, BLOOD (ROUTINE X 2)  CULTURE, BLOOD (ROUTINE X 2)  TROPONIN I  COMPREHENSIVE METABOLIC PANEL  CBC   Imaging Review Dg Chest Portable 1 View  02/16/2013   CLINICAL DATA:  Cough.  Altered mental status.  EXAM: PORTABLE CHEST - 1 VIEW  COMPARISON:  10/18/2012.  FINDINGS: Prominent new infiltrate present in the right lower lobe consistent with pneumonia. Mild diffuse interstitial prominence is again noted. Heart size is borderline enlarged, no pulmonary venous congestion or pleural effusion.  IMPRESSION: Right lower lobe pneumonia. This is a new finding from prior study of 10/18/2012.   Electronically Signed   By: Maisie Fus  Register   On: 02/16/2013 14:30    EKG Interpretation     Ventricular Rate:  113 PR Interval:  166 QRS Duration: 80 QT Interval:  298 QTC Calculation: 408 R Axis:   94 Text Interpretation:  Sinus tachycardia Rightward axis Borderline ECG When compared with ECG of 12-Mar-2012 04:15, No significant change was found No significant change was found            MDM   1. COPD exacerbation   2. CAP (community acquired pneumonia)   3. Acute and chronic respiratory failure (acute-on-chronic)   4. Anxiety   5. Bladder tumor   6. BPH (benign prostatic hyperplasia)    2 days of cough, SOB,  fever.  Hx COPD, no increase in O2 requirement. EKG with sinus tachycardia, fever 104. Wheezing on exam.  Nebs, steroids, O2, ABG, CXR. PNA on CXR.  No Co2 retention on ABG. Levaquin started. Wheezing and WOB improved after nebs, PE considered but clinical picture more consistent with pneumonia.   CRITICAL CARE Performed by: Glynn Octave Total critical care time: 30 Critical care time was exclusive of separately billable procedures and treating other patients. Critical care was necessary to treat or prevent imminent or life-threatening deterioration. Critical care was time spent personally by me on the following activities: development of treatment plan with patient and/or surrogate as well as nursing, discussions with consultants,  evaluation of patient's response to treatment, examination of patient, obtaining history from patient or surrogate, ordering and performing treatments and interventions, ordering and review of laboratory studies, ordering and review of radiographic studies, pulse oximetry and re-evaluation of patient's condition.    I personally performed the services described in this documentation, which was scribed in my presence. The recorded information has been reviewed and is accurate.    Glynn Octave, MD 02/16/13 [phone]

## 2013-02-16 NOTE — ED Notes (Signed)
Pt has HX of COPD, complains of SOB and productive cough x1 week. Pt on chronic 2L [place] O2, Pt sats 95% on 4L, pt given 125mg  Solumedrol IV by EMS and attempted 1 neb, pt unable to tolerate, RR 45.

## 2013-02-16 NOTE — H&P (Signed)
[facility] History and Physical  JEX STRAUSBAUGH BJY:[id number] DOB: 08-04-1940 DOA: 02/16/2013  Referring physician: Emergency department PCP: Remus Loffler, PA-C  Specialists:   Chief Complaint: SOB  HPI: [name] [name] is a 72 y.o. male  With a hx of COPD who presents to the hospital with coughing, sob, wheezing and fevers. Pt is on 2L home o2. In the Ed, the patient was found to have a RLL pneumonia with a wbc of over 20K, fevers, and tachycardia. The patient was started on empiric abx and the hospitalist consulted for admission.  Review of Systems: Per above, the remainder of the 10pt ros reviewed and are neg  Past Medical History  Diagnosis Date  . COPD (chronic obstructive pulmonary disease)   . Asthma   . Tobacco abuse 09/29/2011  . Hypercholesteremia   . GERD (gastroesophageal reflux disease)   . Headache(784.0)   . BPH (benign prostatic hyperplasia) 10/01/2011    Per cystoscopy  . 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  . Pulmonary nodule 09/2011    Stable appearance  . Chronic back pain   . Chronic anxiety   . Hyperlipidemia 03/13/2012  . Anxiety 03/13/2012   Past Surgical History  Procedure Laterality Date  . Cardiac catheterization  2003    [facility]  . Fracture surgery      R ring finger, pin placed  . Cystoscopy  09/30/2011    Procedure: CYSTOSCOPY FLEXIBLE;  Surgeon: Ky Barban, MD;  Location: [facility];  Service: Urology;  Laterality: N/A;  . Transurethral resection of bladder tumor  10/01/2011    Procedure: TRANSURETHRAL RESECTION OF BLADDER TUMOR (TURBT);  Surgeon: Ky Barban, MD;  Location: [facility];  Service: Urology;  Laterality: N/A;   Social History:  reports that he quit smoking about 19 months ago. His smoking use included Cigarettes. He has a 58 pack-year smoking history. He has quit using smokeless tobacco. He reports that he does not drink alcohol or use illicit drugs.  where does patient live--home, ALF, SNF? and  with whom if at home?  Can patient participate in ADLs?  Allergies  Allergen Reactions  . Iohexol Hives     Code: HIVES, Desc: PT STATES HE BROKE OUT IN HIVES AND RASH AFTER CT NECK EARLY SEPT 2011; NO RESP PROBLEMS; NEEDS PRE-MEDS; MKS, Onset Date: [id number]     Family History  Problem Relation Age of Onset  . Emphysema Brother     was not close to family; does not truly know their medical problems.  . Emphysema Brother   . Emphysema Brother   . Cancer Neg Hx     (be sure to complete)  Prior to Admission medications   Medication Sig Start Date End Date Taking? Authorizing Provider  albuterol (PROVENTIL) (2.5 MG/3ML) 0.083% nebulizer solution Take 2.5 mg by nebulization every 4 (four) hours as needed. For shortness of breath   Yes Historical Provider, MD  albuterol-ipratropium (COMBIVENT) 18-103 MCG/ACT inhaler Inhale 2 puffs into the lungs 4 (four) times daily.     Yes Historical Provider, MD  ALPRAZolam Prudy Feeler) 1 MG tablet Take 1 mg by mouth 2 (two) times daily as needed. anxiety   Yes Historical Provider, MD  aspirin EC 81 MG tablet Take 81 mg by mouth daily. 10/02/11  Yes Elliot Cousin, MD  budesonide-formoterol [facility]) 160-4.5 MCG/ACT inhaler Inhale 2 puffs into the lungs 2 (two) times daily.     Yes Historical Provider, MD  Cholecalciferol (VITAMIN D3) 1000  UNITS CAPS Take 1 capsule by mouth daily.    Yes Historical Provider, MD  Garlic 1000 MG CAPS Take 1 capsule by mouth daily.    Yes Historical Provider, MD  HYDROcodone-acetaminophen (NORCO) 10-325 MG per tablet Take 1 tablet by mouth 4 (four) times daily as needed for pain.   Yes Historical Provider, MD  Multiple Vitamins-Minerals (CENTRUM) tablet Take 1 tablet by mouth daily.    Yes Historical Provider, MD  Omega-3 Fatty Acids (FISH OIL PO) Take 1 capsule by mouth daily.   Yes Historical Provider, MD  pantoprazole (PROTONIX) 40 MG tablet Take 40 mg by mouth daily.   Yes Historical Provider, MD  simvastatin (ZOCOR) 40 MG  tablet Take 40 mg by mouth daily.    Yes Historical Provider, MD   Physical Exam: Filed Vitals:   02/16/13 1324 02/16/13 1349  BP: 129/90   Pulse: 111   Temp: 104.5 F (40.3 C)   TempSrc: Rectal   Resp: 26   Height: 5\' 7"  (1.702 m)   Weight: 72.576 kg (160 lb)   SpO2: 96% 95%     General:  Awake, in nad  Eyes: PERRL B  ENT: membranes dry, dentition fair  Neck: trachea midline, neck supple  Cardiovascular: regular, s1, s2  Respiratory: normal resp effort, mod wheezing  Abdomen: soft, nondistended  Skin: normal skin turgor, no abnormal skin lesions seen  Musculoskeletal: perfused, no clubbing  Psychiatric: mood/affect normal // no auditory/visual hallucinations  Neurologic: cn2-12 grossly intact, strength/sensation intact  Labs on Admission:  Basic Metabolic Panel:  Recent Labs Lab 02/16/13 1350  NA 135  K 3.9  CL 95*  CO2 29  GLUCOSE 129*  BUN 11  CREATININE 0.90  CALCIUM 9.8   Liver Function Tests: No results found for this basename: AST, ALT, ALKPHOS, BILITOT, PROT, ALBUMIN,  in the last 168 hours No results found for this basename: LIPASE, AMYLASE,  in the last 168 hours No results found for this basename: AMMONIA,  in the last 168 hours CBC:  Recent Labs Lab 02/16/13 1350  WBC 22.7*  HGB 14.0  HCT 41.9  MCV 95.0  PLT 187   Cardiac Enzymes:  Recent Labs Lab 02/16/13 1350  TROPONINI <0.30    BNP (last 3 results)  Recent Labs  03/11/12 2308  PROBNP 77.4   CBG: No results found for this basename: GLUCAP,  in the last 168 hours  Radiological Exams on Admission: Dg Chest Portable 1 View  02/16/2013   CLINICAL DATA:  Cough.  Altered mental status.  EXAM: PORTABLE CHEST - 1 VIEW  COMPARISON:  10/18/2012.  FINDINGS: Prominent new infiltrate present in the right lower lobe consistent with pneumonia. Mild diffuse interstitial prominence is again noted. Heart size is borderline enlarged, no pulmonary venous congestion or pleural  effusion.  IMPRESSION: Right lower lobe pneumonia. This is a new finding from prior study of 10/18/2012.   Electronically Signed   By: Maisie Fus  Register   On: 02/16/2013 14:30    Assessment/Plan Principal Problem:   Sepsis Active Problems:   HYPERTENSION   C O P D   OSTEOARTHRITIS   BPH (benign prostatic hyperplasia)   CAP (community acquired pneumonia)   1. Sepsis with CAP 1. Admit to med/tele 2. Cont on levaquin as tolerated 3. Follow pan cultures 4. Cont o2 as needed 2. HTN 1. BP stable and controlled 2. Cont home meds 3. COPD 1. Wheezing on exam 2. Will cont on PRN duonebs 3. Cont on PO prednisone 4.  BPH 1. Stable 5. Osteoarthritis 1. Stable 6. DVT prophylaxis 1. Heparin subQ  Code Status: Full (must indicate code status--if unknown or must be presumed, indicate so) Family Communication: Pt in room (indicate person spoken with, if applicable, with phone number if by telephone) Disposition Plan: Pending (indicate anticipated LOS)  Time spent:  Zykeem Bauserman K [facility] Pager [phone]  If 7PM-7AM, please contact night-coverage [email or web address] Password [email or web address] 02/16/2013, 3:09 PM

## 2013-02-17 DIAGNOSIS — A419 Sepsis, unspecified organism: Secondary | ICD-10-CM

## 2013-02-17 DIAGNOSIS — J189 Pneumonia, unspecified organism: Secondary | ICD-10-CM

## 2013-02-17 DIAGNOSIS — F17200 Nicotine dependence, unspecified, uncomplicated: Secondary | ICD-10-CM

## 2013-02-17 LAB — COMPREHENSIVE METABOLIC PANEL
ALT: 20 U/L (ref 0–53)
Albumin: 3.1 g/dL — ABNORMAL LOW (ref 3.5–5.2)
BUN: 13 mg/dL (ref 6–23)
CO2: 31 mEq/L (ref 19–32)
Calcium: 9.8 mg/dL (ref 8.4–10.5)
Chloride: 98 mEq/L (ref 96–112)
Creatinine, Ser: 0.78 mg/dL (ref 0.50–1.35)
GFR calc Af Amer: >90 mL/min (ref >90)
GFR calc non Af Amer: 89 mL/min — ABNORMAL LOW (ref >90)
Glucose, Bld: 179 mg/dL — ABNORMAL HIGH (ref 70–99)
Total Bilirubin: 0.5 mg/dL (ref 0.3–1.2)
Total Protein: 7.1 g/dL (ref 6.0–8.3)

## 2013-02-17 LAB — CBC
MCH: 31.5 pg (ref 26.0–34.0)
MCHC: 33.1 g/dL (ref 30.0–36.0)
Platelets: 186 10*3/uL (ref 150–400)
RBC: 4.16 MIL/uL — ABNORMAL LOW (ref 4.22–5.81)
WBC: 25.2 10*3/uL — ABNORMAL HIGH (ref 4.0–10.5)

## 2013-02-17 MED ORDER — METHYLPREDNISOLONE SODIUM SUCC 125 MG IJ SOLR
60.0000 mg | Freq: Two times a day (BID) | INTRAMUSCULAR | Status: DC
  Administered 2013-02-17 – 2013-02-18 (×3): 60 mg via INTRAVENOUS
  Filled 2013-02-17 (×3): qty 2

## 2013-02-17 NOTE — Progress Notes (Signed)
[facility] PROGRESS NOTE  [name] [name] UJW:[id number] DOB: [birth year] DOA: 02/16/2013 PCP: Remus Loffler, PA-C  Assessment/Plan: 1. Sepsis with CAP  -improving, cut down IVF, fevers down -Cont levaquin -cultures negative to date -wean O2  2. HTN  -BP stable and controlled -Cont home meds  3. COPD with exacerbation -change prednisone to IV solumedrol,  -nebs-scheduled and PRN  4. BPH  - Stable  5. Osteoarthritis -Stable  DVT prophylaxis  -Heparin subQ   Antibiotics:  levaquin  HPI/Subjective: Breathing a little better  Objective: Filed Vitals:   02/17/13 1430  BP: 104/68  Pulse: 86  Temp: 97.6 F (36.4 C)  Resp: 20    Intake/Output Summary (Last 24 hours) at 02/17/13 1634 Last data filed at 02/17/13 1240  Gross per 24 hour  Intake 2501.67 ml  Output    900 ml  Net 1601.67 ml   Filed Weights   02/16/13 1324 02/16/13 1630  Weight: 72.576 kg (160 lb) 74.2 kg (163 lb 9.3 oz)    Exam:   General: AAOx3  Cardiovascular: S1S2/RRR  Respiratory: scattered ronchi  Abdomen:soft, Nt, BS present  Musculoskeletal: no edema c/c  Data Reviewed: Basic Metabolic Panel:  Recent Labs Lab 02/16/13 1350 02/17/13 0545  NA 135 137  K 3.9 3.6  CL 95* 98  CO2 29 31  GLUCOSE 129* 179*  BUN 11 13  CREATININE 0.90 0.78  CALCIUM 9.8 9.8   Liver Function Tests:  Recent Labs Lab 02/17/13 0545  AST 26  ALT 20  ALKPHOS 87  BILITOT 0.5  PROT 7.1  ALBUMIN 3.1*   No results found for this basename: LIPASE, AMYLASE,  in the last 168 hours No results found for this basename: AMMONIA,  in the last 168 hours CBC:  Recent Labs Lab 02/16/13 1350 02/17/13 0545  WBC 22.7* 25.2*  HGB 14.0 13.1  HCT 41.9 39.6  MCV 95.0 95.2  PLT 187 186   Cardiac Enzymes:  Recent Labs Lab 02/16/13 1350  TROPONINI <0.30   BNP (last 3 results)  Recent Labs  03/11/12 2308  PROBNP 77.4   CBG: No results found for this basename: GLUCAP,  in the last  168 hours  Recent Results (from the past 240 hour(s))  CULTURE, BLOOD (ROUTINE X 2)     Status: None   Collection Time    02/16/13  3:58 PM      Result Value Range Status   Specimen Description BLOOD RIGHT HAND   Final   Special Requests BOTTLES DRAWN AEROBIC AND ANAEROBIC 5CC   Final   Culture NO GROWTH 1 DAY   Final   Report Status PENDING   Incomplete  CULTURE, BLOOD (ROUTINE X 2)     Status: None   Collection Time    02/16/13  3:58 PM      Result Value Range Status   Specimen Description BLOOD RIGHT ANTECUBITAL   Final   Special Requests     Final   Value: BOTTLES DRAWN AEROBIC AND ANAEROBIC AEB=8CC ANA=6CC   Culture NO GROWTH 1 DAY   Final   Report Status PENDING   Incomplete     Studies: Dg Chest Portable 1 View  02/16/2013   CLINICAL DATA:  Cough.  Altered mental status.  EXAM: PORTABLE CHEST - 1 VIEW  COMPARISON:  10/18/2012.  FINDINGS: Prominent new infiltrate present in the right lower lobe consistent with pneumonia. Mild diffuse interstitial prominence is again noted. Heart size is borderline enlarged, no pulmonary venous congestion or  pleural effusion.  IMPRESSION: Right lower lobe pneumonia. This is a new finding from prior study of 10/18/2012.   Electronically Signed   By: Maisie Fus  Register   On: 02/16/2013 14:30    Scheduled Meds: . albuterol  2.5 mg Nebulization Q4H   And  . ipratropium  0.5 mg Nebulization Q4H  . aspirin EC  81 mg Oral Daily  . levofloxacin (LEVAQUIN) IV  500 mg Intravenous Q24H  . methylPREDNISolone (SOLU-MEDROL) injection  60 mg Intravenous Q12H  . pantoprazole  40 mg Oral Daily  . simvastatin  40 mg Oral q1800  . sodium chloride  3 mL Intravenous Q12H   Continuous Infusions: . sodium chloride 100 mL/hr at 02/16/13 1515    Principal Problem:   Sepsis Active Problems:   HYPERTENSION   C O P D   OSTEOARTHRITIS   BPH (benign prostatic hyperplasia)   CAP (community acquired pneumonia)    Time spent:     [facility]  [facility] Pager [phone]. If 7PM-7AM, please contact night-coverage at [email or web address], password [facility] 02/17/2013, 4:34 PM  LOS: 1 day

## 2013-02-17 NOTE — Evaluation (Signed)
Physical Therapy Evaluation Patient Details Name: [name] [name] MRN: [id number] DOB: 1941-01-02 Today's Date: 02/17/2013 Time: 1191-4782 PT Time Calculation (min): 23 min  PT Assessment / Plan / Recommendation History of Present Illness  Pt with a hx of COPD/asthma and O2 dependent on 2 L O2 is admitted with pneumonia.  He is now on 4 L O2.  He lives with a friend and is independent at home.  Clinical Impression  Pt was seen for evaluation.  He is very pleasant and cooperative, feels fairly well.  O2 sat on 4 L O2 was 93%.  His strength and mobility was found to be WNL.  He should not have any difficulty transitioning to home.  Will ask nursing service to ambulate him in the hall.   PT Assessment  Patent does not need any further PT services    Follow Up Recommendations  No PT follow up    Does the patient have the potential to tolerate intense rehabilitation      Barriers to Discharge        Equipment Recommendations  None recommended by PT    Recommendations for Other Services     Frequency      Precautions / Restrictions Precautions Precautions: None Restrictions Weight Bearing Restrictions: No   Pertinent Vitals/Pain       Mobility  Bed Mobility Bed Mobility: Supine to Sit;Sit to Supine Supine to Sit: 7: Independent Sit to Supine: 7: Independent Transfers Transfers: Sit to Stand;Stand to Sit Sit to Stand: 7: Independent;From bed;With upper extremity assist Stand to Sit: 7: Independent;To bed;With upper extremity assist Ambulation/Gait Ambulation/Gait Assistance: 6: Modified independent (Device/Increase time) Ambulation Distance (Feet): 200 Feet Assistive device: None Gait Pattern: Within Functional Limits Gait velocity: WNL Stairs: No Wheelchair Mobility Wheelchair Mobility: No    Exercises     PT Diagnosis:    PT Problem List:   PT Treatment Interventions:       PT Goals(Current goals can be found in the care plan section) Acute Rehab PT  Goals PT Goal Formulation: No goals set, d/c therapy  Visit Information  Last PT Received On: 02/17/13 History of Present Illness: Pt with a hx of COPD/asthma and O2 dependent on 2 L O2 is admitted with pneumonia.  He is now on 4 L O2.  He lives with a friend and is independent at home.       Prior Functioning  Home Living Family/patient expects to be discharged to:: Private residence Living Arrangements: Spouse/significant other;Children Available Help at Discharge: Family;Available 24 hours/day Type of Home: House Home Access: Ramped entrance Home Layout: One level Home Equipment: None Prior Function Level of Independence: Independent Communication Communication: No difficulties    Cognition  Cognition Arousal/Alertness: Awake/alert Behavior During Therapy: WFL for tasks assessed/performed Overall Cognitive Status: Within Functional Limits for tasks assessed    Extremity/Trunk Assessment Lower Extremity Assessment Lower Extremity Assessment: Overall WFL for tasks assessed Cervical / Trunk Assessment Cervical / Trunk Assessment: Normal   Balance Balance Balance Assessed: No (WNL by functional observation)  End of Session PT - End of Session Equipment Utilized During Treatment: Gait belt Activity Tolerance: Patient tolerated treatment well Patient left: in bed;with call bell/phone within reach;with bed alarm set;with family/visitor present (declines sitting in chair)  GP     Myrlene Broker L 02/17/2013, 2:04 PM

## 2013-02-18 MED ORDER — LEVOFLOXACIN 500 MG PO TABS
500.0000 mg | ORAL_TABLET | Freq: Every day | ORAL | Status: DC
  Administered 2013-02-18 – 2013-02-20 (×3): 500 mg via ORAL
  Filled 2013-02-18 (×3): qty 1

## 2013-02-18 MED ORDER — PREDNISONE 20 MG PO TABS
50.0000 mg | ORAL_TABLET | Freq: Every day | ORAL | Status: DC
  Administered 2013-02-19: 50 mg via ORAL
  Filled 2013-02-18: qty 1
  Filled 2013-02-18: qty 2

## 2013-02-18 MED ORDER — METHYLPREDNISOLONE SODIUM SUCC 125 MG IJ SOLR: 60.0000 mg | Freq: Three times a day (TID) | INTRAMUSCULAR | Status: DC

## 2013-02-18 MED ORDER — METHYLPREDNISOLONE SODIUM SUCC 125 MG IJ SOLR
60.0000 mg | Freq: Three times a day (TID) | INTRAMUSCULAR | Status: AC
  Administered 2013-02-18 (×2): 60 mg via INTRAVENOUS
  Filled 2013-02-18 (×2): qty 2

## 2013-02-18 NOTE — Progress Notes (Signed)
UR Chart Review Completed  

## 2013-02-18 NOTE — Progress Notes (Addendum)
[facility] PROGRESS NOTE  [name] [name] ZOX:[id number] DOB: Sep 16, 1940 DOA: 02/16/2013 PCP: Remus Loffler, PA-C  Assessment/Plan: 1. Sepsis with CAP  -improving, cut down IVF, fevers down -Cont levaquin, change to PO -cultures negative to date -wean O2  2. HTN  -BP stable and controlled -Cont home meds  3. COPD with exacerbation -continue IV solumedrol today, change to Prednisone tomorrow -nebs-scheduled and PRN -uses 2.5L at home  4. BPH  - Stable  5. Leukocytosis -worse due to increased steroids, otherwise clinically improving  DVT prophylaxis  -Heparin subQ   Antibiotics:  levaquin  HPI/Subjective: Breathing a little better  Objective: Filed Vitals:   02/18/13 0530  BP: 111/63  Pulse: 65  Temp: 97.5 F (36.4 C)  Resp: 20    Intake/Output Summary (Last 24 hours) at 02/18/13 1155 Last data filed at 02/17/13 1240  Gross per 24 hour  Intake    240 ml  Output      0 ml  Net    240 ml   Filed Weights   02/16/13 1324 02/16/13 1630  Weight: 72.576 kg (160 lb) 74.2 kg (163 lb 9.3 oz)    Exam:   General: AAOx3  Cardiovascular: S1S2/RRR  Respiratory: scattered ronchi  Abdomen:soft, Nt, BS present  Musculoskeletal: no edema c/c  Data Reviewed: Basic Metabolic Panel:  Recent Labs Lab 02/16/13 1350 02/17/13 0545  NA 135 137  K 3.9 3.6  CL 95* 98  CO2 29 31  GLUCOSE 129* 179*  BUN 11 13  CREATININE 0.90 0.78  CALCIUM 9.8 9.8   Liver Function Tests:  Recent Labs Lab 02/17/13 0545  AST 26  ALT 20  ALKPHOS 87  BILITOT 0.5  PROT 7.1  ALBUMIN 3.1*   No results found for this basename: LIPASE, AMYLASE,  in the last 168 hours No results found for this basename: AMMONIA,  in the last 168 hours CBC:  Recent Labs Lab 02/16/13 1350 02/17/13 0545  WBC 22.7* 25.2*  HGB 14.0 13.1  HCT 41.9 39.6  MCV 95.0 95.2  PLT 187 186   Cardiac Enzymes:  Recent Labs Lab 02/16/13 1350  TROPONINI <0.30   BNP (last 3  results)  Recent Labs  03/11/12 2308  PROBNP 77.4   CBG: No results found for this basename: GLUCAP,  in the last 168 hours  Recent Results (from the past 240 hour(s))  CULTURE, BLOOD (ROUTINE X 2)     Status: None   Collection Time    02/16/13  3:58 PM      Result Value Range Status   Specimen Description BLOOD RIGHT HAND   Final   Special Requests BOTTLES DRAWN AEROBIC AND ANAEROBIC 5CC   Final   Culture NO GROWTH 2 DAYS   Final   Report Status PENDING   Incomplete  CULTURE, BLOOD (ROUTINE X 2)     Status: None   Collection Time    02/16/13  3:58 PM      Result Value Range Status   Specimen Description BLOOD RIGHT ANTECUBITAL   Final   Special Requests     Final   Value: BOTTLES DRAWN AEROBIC AND ANAEROBIC AEB=8CC ANA=6CC   Culture NO GROWTH 2 DAYS   Final   Report Status PENDING   Incomplete     Studies: Dg Chest Portable 1 View  02/16/2013   CLINICAL DATA:  Cough.  Altered mental status.  EXAM: PORTABLE CHEST - 1 VIEW  COMPARISON:  10/18/2012.  FINDINGS: Prominent new infiltrate present in  the right lower lobe consistent with pneumonia. Mild diffuse interstitial prominence is again noted. Heart size is borderline enlarged, no pulmonary venous congestion or pleural effusion.  IMPRESSION: Right lower lobe pneumonia. This is a new finding from prior study of 10/18/2012.   Electronically Signed   By: Maisie Fus  Register   On: 02/16/2013 14:30    Scheduled Meds: . albuterol  2.5 mg Nebulization Q4H   And  . ipratropium  0.5 mg Nebulization Q4H  . aspirin EC  81 mg Oral Daily  . levofloxacin  500 mg Oral Daily  . methylPREDNISolone (SOLU-MEDROL) injection  60 mg Intravenous TID  . pantoprazole  40 mg Oral Daily  . simvastatin  40 mg Oral q1800  . sodium chloride  3 mL Intravenous Q12H   Continuous Infusions: . sodium chloride 100 mL/hr at 02/16/13 1515    Principal Problem:   Sepsis Active Problems:   HYPERTENSION   C O P D   OSTEOARTHRITIS   BPH (benign prostatic  hyperplasia)   CAP (community acquired pneumonia)    Time spent:    [facility]  [facility] Pager [phone]. If 7PM-7AM, please contact night-coverage at [email or web address], password [facility] 02/18/2013, 11:55 AM  LOS: 2 days

## 2013-02-19 LAB — CBC
HCT: 37.2 % — ABNORMAL LOW (ref 39.0–52.0)
Hemoglobin: 12.4 g/dL — ABNORMAL LOW (ref 13.0–17.0)
MCH: 31.7 pg (ref 26.0–34.0)
Platelets: 216 10*3/uL (ref 150–400)
RBC: 3.91 MIL/uL — ABNORMAL LOW (ref 4.22–5.81)
WBC: 19.3 10*3/uL — ABNORMAL HIGH (ref 4.0–10.5)

## 2013-02-19 MED ORDER — METHYLPREDNISOLONE SODIUM SUCC 125 MG IJ SOLR
60.0000 mg | Freq: Four times a day (QID) | INTRAMUSCULAR | Status: DC
  Administered 2013-02-19 – 2013-02-20 (×4): 60 mg via INTRAVENOUS
  Filled 2013-02-19 (×4): qty 2

## 2013-02-19 NOTE — Progress Notes (Signed)
[facility] PROGRESS NOTE  [name] [name] ZOX:[id number] DOB: 12-25-1940 DOA: 02/16/2013 PCP: Remus Loffler, PA-C  Assessment/Plan: 1. Sepsis with CAP  -improving, cut down IVF, fevers down -Cont levaquin, change to PO -cultures negative to date -wean O2, uses 2.5L at home  2. HTN  -BP stable and controlled -Cont home meds  3. COPD with exacerbation -finally improving -continue IV solumedrol today, 60mg  Q6 -nebs-scheduled and PRN -uses 2.5L at home  4. BPH  - Stable  5. Leukocytosis -improving - due to increased steroids, otherwise clinically improving  DVT prophylaxis  -Heparin subQ   Antibiotics:  levaquin  HPI/Subjective: Breathing a little better  Objective: Filed Vitals:   02/19/13 0652  BP: 149/75  Pulse: 66  Temp: 98 F (36.7 C)  Resp: 20    Intake/Output Summary (Last 24 hours) at 02/19/13 1254 Last data filed at 02/19/13 0800  Gross per 24 hour  Intake    720 ml  Output    400 ml  Net    320 ml   Filed Weights   02/16/13 1324 02/16/13 1630  Weight: 72.576 kg (160 lb) 74.2 kg (163 lb 9.3 oz)    Exam:   General: AAOx3  Cardiovascular: S1S2/RRR  Respiratory: scattered ronchi  Abdomen:soft, Nt, BS present  Musculoskeletal: no edema c/c  Data Reviewed: Basic Metabolic Panel:  Recent Labs Lab 02/16/13 1350 02/17/13 0545  NA 135 137  K 3.9 3.6  CL 95* 98  CO2 29 31  GLUCOSE 129* 179*  BUN 11 13  CREATININE 0.90 0.78  CALCIUM 9.8 9.8   Liver Function Tests:  Recent Labs Lab 02/17/13 0545  AST 26  ALT 20  ALKPHOS 87  BILITOT 0.5  PROT 7.1  ALBUMIN 3.1*   No results found for this basename: LIPASE, AMYLASE,  in the last 168 hours No results found for this basename: AMMONIA,  in the last 168 hours CBC:  Recent Labs Lab 02/16/13 1350 02/17/13 0545 02/19/13 0527  WBC 22.7* 25.2* 19.3*  HGB 14.0 13.1 12.4*  HCT 41.9 39.6 37.2*  MCV 95.0 95.2 95.1  PLT 187 186 216   Cardiac Enzymes:  Recent  Labs Lab 02/16/13 1350  TROPONINI <0.30   BNP (last 3 results)  Recent Labs  03/11/12 2308  PROBNP 77.4   CBG: No results found for this basename: GLUCAP,  in the last 168 hours  Recent Results (from the past 240 hour(s))  CULTURE, BLOOD (ROUTINE X 2)     Status: None   Collection Time    02/16/13  3:58 PM      Result Value Range Status   Specimen Description BLOOD RIGHT HAND   Final   Special Requests BOTTLES DRAWN AEROBIC AND ANAEROBIC 5CC   Final   Culture NO GROWTH 3 DAYS   Final   Report Status PENDING   Incomplete  CULTURE, BLOOD (ROUTINE X 2)     Status: None   Collection Time    02/16/13  3:58 PM      Result Value Range Status   Specimen Description BLOOD RIGHT ANTECUBITAL   Final   Special Requests     Final   Value: BOTTLES DRAWN AEROBIC AND ANAEROBIC AEB=8CC ANA=6CC   Culture NO GROWTH 3 DAYS   Final   Report Status PENDING   Incomplete     Studies: No results found.  Scheduled Meds: . albuterol  2.5 mg Nebulization Q4H   And  . ipratropium  0.5 mg Nebulization Q4H  .  aspirin EC  81 mg Oral Daily  . levofloxacin  500 mg Oral Daily  . methylPREDNISolone (SOLU-MEDROL) injection  60 mg Intravenous Q6H  . pantoprazole  40 mg Oral Daily  . simvastatin  40 mg Oral q1800  . sodium chloride  3 mL Intravenous Q12H   Continuous Infusions: . sodium chloride 20 mL/hr at 02/18/13 2137    Principal Problem:   Sepsis Active Problems:   HYPERTENSION   C O P D   OSTEOARTHRITIS   BPH (benign prostatic hyperplasia)   CAP (community acquired pneumonia)    Time spent:    [facility]  [facility] Pager [phone]. If 7PM-7AM, please contact night-coverage at [email or web address], password [facility] 02/19/2013, 12:54 PM  LOS: 3 days

## 2013-02-19 NOTE — Care Management Note (Unsigned)
    Page 1 of 2   02/19/2013     5:06:08 PM   CARE MANAGEMENT NOTE 02/19/2013  Patient:  [name] [name]   Account Number:  [id number]  Date Initiated:  02/19/2013  Documentation initiated by:  [name] [name]  Subjective/Objective Assessment:   patient admitted from home with PNA. patient lives with family wife and children.He is on 3 L of oxygen at  home. he has a nebulizer machine that this is broken and he would like to have a new one.     Action/Plan:   patient would benefit from an RN to monitor O2 sats and cardiopulmonary status following discharge   Anticipated DC Date:  02/20/2013   Anticipated DC Plan:  HOME W HOME HEALTH SERVICES      DC Planning Services  CM consult      [facility] Choice  HOME HEALTH  DURABLE MEDICAL EQUIPMENT   Choice offered to / List presented to:  C-1 Patient   DME arranged  NEBULIZER MACHINE      DME agency  [facility]     [facility] arranged  HH-1 RN      [facility] agency  [facility]   Status of service:  Completed, signed off Medicare Important Message given?   (If response is "NO", the following Medicare IM given date fields will be blank) Date Medicare IM given:   Date Additional Medicare IM given:    Discharge Disposition:  HOME W HOME HEALTH SERVICES  Per UR Regulation:    If discussed at Long Length of Stay Meetings, dates discussed:    Comments:  02/19/13 1640 [name] Cobbs RN/CM

## 2013-02-20 MED ORDER — ALPRAZOLAM 1 MG PO TABS: 1.0000 mg | ORAL_TABLET | Freq: Two times a day (BID) | ORAL | Status: DC | PRN

## 2013-02-20 MED ORDER — HYDROCODONE-ACETAMINOPHEN 10-325 MG PO TABS: 1.0000 | ORAL_TABLET | Freq: Four times a day (QID) | ORAL | Status: DC | PRN

## 2013-02-20 MED ORDER — PREDNISONE 20 MG PO TABS: ORAL_TABLET | ORAL | Status: DC

## 2013-02-20 MED ORDER — LEVOFLOXACIN 500 MG PO TABS: 500.0000 mg | ORAL_TABLET | Freq: Every day | ORAL | Status: DC

## 2013-02-20 NOTE — Progress Notes (Signed)
IV removed, site WNL.  Pt given d/c instructions and new prescriptions.  Discussed home care with patient and discussed home medications, patient verbalizes understanding, teachback completed. F/U appointment to be made by pt when offices open, pt state he will make and keep appointment. Pt is stable at this time.

## 2013-02-20 NOTE — Discharge Summary (Signed)
Physician Discharge Summary  [name] [name]:[id number] DOB: 1940-07-05 DOA: 02/16/2013  PCP: Remus Loffler, PA-C  Admit date: 02/16/2013 Discharge date: 02/20/2013  Time spent: 45 minutes  Recommendations for Outpatient Follow-up:  1. 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 male  With a hx of COPD who presents to the hospital with coughing, sob, wheezing and fevers. Pt is on 2L home o2. In the Ed, the patient was found to have a RLL pneumonia with a wbc of over 20K, fevers, and tachycardia. The patient was started on empiric abx and the hospitalist consulted for admission.  Hospital Course:  1. Sepsis with CAP  -improving, cut down IVF, fevers down  -Cont levaquin, change to PO, continue at discharge for 3 more days -cultures negative to date  -wean O2, uses 2.5L at home  -O2 requirement back to baseline  2. HTN  -BP stable and controlled  -Cont home meds   3. COPD with exacerbation/chronic resp failure on 2.5L Home O2 -finally improving  -treated with IV solumedrol then transitioned to PO prednisone  -nebs-scheduled and PRN  -uses 2.5L O2 at home   4. BPH  - Stable   5. Leukocytosis  -improving  - due to increased steroids, otherwise clinically improving      Discharge Exam: Filed Vitals:   02/20/13 0700  BP: 161/90  Pulse: 78  Temp: 97.8 F (36.6 C)  Resp: 18    General: AAOx3,  Cardiovascular: S1S2/RRR Respiratory: CTAB  Discharge Instructions  Discharge Orders   Future Orders  Complete By Expires   Diet - low sodium heart healthy  As directed    Increase activity slowly  As directed        Medication List         albuterol (2.5 MG/3ML) 0.083% nebulizer solution  Commonly known as:  PROVENTIL  Take 2.5 mg by nebulization every 4 (four) hours as needed. For shortness of breath     albuterol-ipratropium 18-103 MCG/ACT inhaler  Commonly known as:  COMBIVENT  Inhale 2 puffs into the lungs 4 (four) times daily.     ALPRAZolam 1 MG tablet  Commonly known as:  XANAX  Take 1 tablet (1 mg total) by mouth 2 (two) times daily as needed. anxiety     aspirin EC 81 MG tablet  Take 81 mg by mouth daily.     budesonide-formoterol 160-4.5 MCG/ACT inhaler  Commonly known as:  SYMBICORT  Inhale 2 puffs into the lungs 2 (two) times daily.     CENTRUM tablet  Take 1 tablet by mouth daily.     FISH OIL PO  Take 1 capsule by mouth daily.     Garlic 1000 MG Caps  Take 1 capsule by mouth daily.     HYDROcodone-acetaminophen 10-325 MG per tablet  Commonly known as:  NORCO  Take 1 tablet by mouth every 6 (six) hours as needed.     levofloxacin 500 MG tablet  Commonly  known as:  LEVAQUIN  Take 1 tablet (500 mg total) by mouth daily. For 3 days     pantoprazole 40 MG tablet  Commonly known as:  PROTONIX  Take 40 mg by mouth daily.     predniSONE 20 MG tablet  Commonly known as:  DELTASONE  Take 60mg  for 2 days then 40mg  for 2 days then 20mg  for 2 days then STOP     simvastatin 40 MG tablet  Commonly known as:  ZOCOR  Take 40 mg by mouth daily.     Vitamin D3 1000 UNITS Caps  Take 1 capsule by mouth daily.       Allergies  Allergen Reactions  . Iohexol Hives     Code: HIVES, Desc: PT STATES HE BROKE OUT IN HIVES AND RASH AFTER CT NECK EARLY SEPT 2011; NO RESP PROBLEMS; NEEDS PRE-MEDS; MKS, Onset Date: [id number]        Follow-up Information   Follow up with Remus Loffler, PA-C. Schedule an appointment as soon as possible for a visit in 1 week.    Specialty:  General Practice   Contact information:   [address] [place] [address] [phone]        The results of significant diagnostics from this hospitalization (including imaging, microbiology, ancillary and laboratory) are listed below for reference.    Significant Diagnostic Studies: Dg Chest Portable 1 View  02/16/2013   CLINICAL DATA:  Cough.  Altered mental status.  EXAM: PORTABLE CHEST - 1 VIEW  COMPARISON:  10/18/2012.  FINDINGS: Prominent new infiltrate present in the right lower lobe consistent with pneumonia. Mild diffuse interstitial prominence is again noted. Heart size is borderline enlarged, no pulmonary venous congestion or pleural effusion.  IMPRESSION: Right lower lobe pneumonia. This is a new finding from prior study of 10/18/2012.   Electronically Signed   By: Maisie Fus  Register   On: 02/16/2013 14:30    Microbiology: Recent Results (from the past 240 hour(s))  CULTURE, BLOOD (ROUTINE X 2)     Status: None   Collection Time    02/16/13  3:58 PM      Result Value Range Status   Specimen Description BLOOD RIGHT HAND   Final   Special Requests BOTTLES DRAWN AEROBIC AND ANAEROBIC 5CC   Final   Culture NO GROWTH 3 DAYS   Final   Report Status PENDING   Incomplete  CULTURE, BLOOD (ROUTINE X 2)     Status: None   Collection Time    02/16/13  3:58 PM      Result Value Range Status   Specimen Description BLOOD RIGHT ANTECUBITAL   Final   Special Requests     Final   Value: BOTTLES DRAWN AEROBIC AND ANAEROBIC AEB=8CC ANA=6CC   Culture NO GROWTH 3 DAYS   Final   Report Status PENDING   Incomplete     Labs: Basic Metabolic Panel:  Recent Labs Lab 02/16/13 1350 02/17/13 0545  NA 135 137  K 3.9 3.6  CL 95* 98  CO2 29 31  GLUCOSE 129* 179*  BUN 11 13  CREATININE 0.90 0.78  CALCIUM 9.8 9.8   Liver Function Tests:  Recent Labs Lab 02/17/13 0545  AST 26  ALT 20  ALKPHOS 87  BILITOT 0.5  PROT 7.1  ALBUMIN 3.1*   No results found for this  basename: LIPASE, AMYLASE,  in the last 168 hours No results found for this basename: AMMONIA,  in the last 168 hours CBC:  Recent  Labs Lab 02/16/13 1350 02/17/13 0545 02/19/13 0527  WBC 22.7* 25.2* 19.3*  HGB 14.0 13.1 12.4*  HCT 41.9 39.6 37.2*  MCV 95.0 95.2 95.1  PLT 187 186 216   Cardiac Enzymes:  Recent Labs Lab 02/16/13 1350  TROPONINI <0.30   BNP: BNP (last 3 results)  Recent Labs  03/11/12 2308  PROBNP 77.4   CBG: No results found for this basename: GLUCAP,  in the last 168 hours     Signed:  Hennesy Sobalvarro  [facility] 02/20/2013, 9:54 AM

## 2013-02-21 LAB — CULTURE, BLOOD (ROUTINE X 2)
Culture: NO GROWTH
Culture: NO GROWTH

## 2013-03-25 ENCOUNTER — Inpatient Hospital Stay (HOSPITAL_COMMUNITY)
Admission: EM | Admit: 2013-03-25 | Discharge: 2013-03-30 | DRG: 190 | Disposition: A | Discharge status: Home / Self Care | Payer: Medicare Other | Attending: Internal Medicine | Admitting: Internal Medicine

## 2013-03-25 ENCOUNTER — Encounter (HOSPITAL_COMMUNITY): Payer: Self-pay | Admitting: Emergency Medicine

## 2013-03-25 ENCOUNTER — Emergency Department (HOSPITAL_COMMUNITY): Payer: Medicare Other

## 2013-03-25 DIAGNOSIS — R7309 Other abnormal glucose: Secondary | ICD-10-CM | POA: Diagnosis present

## 2013-03-25 DIAGNOSIS — J40 Bronchitis, not specified as acute or chronic: Secondary | ICD-10-CM

## 2013-03-25 DIAGNOSIS — J9620 Acute and chronic respiratory failure, unspecified whether with hypoxia or hypercapnia: Secondary | ICD-10-CM

## 2013-03-25 DIAGNOSIS — J4489 Other specified chronic obstructive pulmonary disease: Secondary | ICD-10-CM

## 2013-03-25 DIAGNOSIS — R0902 Hypoxemia: Secondary | ICD-10-CM

## 2013-03-25 DIAGNOSIS — J449 Chronic obstructive pulmonary disease, unspecified: Secondary | ICD-10-CM

## 2013-03-25 DIAGNOSIS — M549 Dorsalgia, unspecified: Secondary | ICD-10-CM | POA: Diagnosis present

## 2013-03-25 DIAGNOSIS — F419 Anxiety disorder, unspecified: Secondary | ICD-10-CM

## 2013-03-25 DIAGNOSIS — T380X5A Adverse effect of glucocorticoids and synthetic analogues, initial encounter: Secondary | ICD-10-CM | POA: Diagnosis present

## 2013-03-25 DIAGNOSIS — E7800 Pure hypercholesterolemia, unspecified: Secondary | ICD-10-CM | POA: Diagnosis present

## 2013-03-25 DIAGNOSIS — Z9981 Dependence on supplemental oxygen: Secondary | ICD-10-CM

## 2013-03-25 DIAGNOSIS — F411 Generalized anxiety disorder: Secondary | ICD-10-CM | POA: Diagnosis present

## 2013-03-25 DIAGNOSIS — D494 Neoplasm of unspecified behavior of bladder: Secondary | ICD-10-CM

## 2013-03-25 DIAGNOSIS — Z79899 Other long term (current) drug therapy: Secondary | ICD-10-CM

## 2013-03-25 DIAGNOSIS — Z8559 Personal history of malignant neoplasm of other urinary tract organ: Secondary | ICD-10-CM

## 2013-03-25 DIAGNOSIS — J9610 Chronic respiratory failure, unspecified whether with hypoxia or hypercapnia: Secondary | ICD-10-CM

## 2013-03-25 DIAGNOSIS — J441 Chronic obstructive pulmonary disease with (acute) exacerbation: Principal | ICD-10-CM

## 2013-03-25 DIAGNOSIS — J984 Other disorders of lung: Secondary | ICD-10-CM

## 2013-03-25 DIAGNOSIS — J189 Pneumonia, unspecified organism: Secondary | ICD-10-CM

## 2013-03-25 DIAGNOSIS — Z8701 Personal history of pneumonia (recurrent): Secondary | ICD-10-CM

## 2013-03-25 DIAGNOSIS — E785 Hyperlipidemia, unspecified: Secondary | ICD-10-CM

## 2013-03-25 DIAGNOSIS — A419 Sepsis, unspecified organism: Secondary | ICD-10-CM

## 2013-03-25 DIAGNOSIS — K219 Gastro-esophageal reflux disease without esophagitis: Secondary | ICD-10-CM | POA: Diagnosis present

## 2013-03-25 DIAGNOSIS — I10 Essential (primary) hypertension: Secondary | ICD-10-CM

## 2013-03-25 DIAGNOSIS — R310 Gross hematuria: Secondary | ICD-10-CM

## 2013-03-25 DIAGNOSIS — F17200 Nicotine dependence, unspecified, uncomplicated: Secondary | ICD-10-CM | POA: Diagnosis present

## 2013-03-25 DIAGNOSIS — M1990 Unspecified osteoarthritis, unspecified site: Secondary | ICD-10-CM

## 2013-03-25 DIAGNOSIS — G8929 Other chronic pain: Secondary | ICD-10-CM

## 2013-03-25 DIAGNOSIS — R739 Hyperglycemia, unspecified: Secondary | ICD-10-CM

## 2013-03-25 DIAGNOSIS — N400 Enlarged prostate without lower urinary tract symptoms: Secondary | ICD-10-CM

## 2013-03-25 DIAGNOSIS — Z720 Tobacco use: Secondary | ICD-10-CM

## 2013-03-25 LAB — COMPREHENSIVE METABOLIC PANEL
ALT: 12 U/L (ref 0–53)
AST: 15 U/L (ref 0–37)
CO2: 32 mEq/L (ref 19–32)
Calcium: 10 mg/dL (ref 8.4–10.5)
Chloride: 95 mEq/L — ABNORMAL LOW (ref 96–112)
GFR calc Af Amer: >90 mL/min (ref >90)
GFR calc non Af Amer: 88 mL/min — ABNORMAL LOW (ref >90)
Glucose, Bld: 97 mg/dL (ref 70–99)
Potassium: 3.9 mEq/L (ref 3.5–5.1)
Sodium: 137 mEq/L (ref 135–145)
Total Bilirubin: 0.4 mg/dL (ref 0.3–1.2)
Total Protein: 7.7 g/dL (ref 6.0–8.3)

## 2013-03-25 LAB — CBC WITH DIFFERENTIAL/PLATELET
Basophils Absolute: 0 10*3/uL (ref 0.0–0.1)
Eosinophils Relative: 0 % (ref 0–5)
HCT: 40.6 % (ref 39.0–52.0)
Lymphocytes Relative: 15 % (ref 12–46)
Lymphs Abs: 3.2 10*3/uL (ref 0.7–4.0)
MCV: 96.2 fL (ref 78.0–100.0)
Monocytes Absolute: 2.5 10*3/uL — ABNORMAL HIGH (ref 0.1–1.0)
Neutro Abs: 15.4 10*3/uL — ABNORMAL HIGH (ref 1.7–7.7)
Platelets: 247 10*3/uL (ref 150–400)
RBC: 4.22 MIL/uL (ref 4.22–5.81)
WBC: 21.2 10*3/uL — ABNORMAL HIGH (ref 4.0–10.5)

## 2013-03-25 MED ORDER — ALBUTEROL SULFATE (5 MG/ML) 0.5% IN NEBU: 2.5000 mg | INHALATION_SOLUTION | RESPIRATORY_TRACT | Status: DC | PRN

## 2013-03-25 MED ORDER — VANCOMYCIN HCL IN DEXTROSE 1-5 GM/200ML-% IV SOLN
1000.0000 mg | Freq: Two times a day (BID) | INTRAVENOUS | Status: DC
  Administered 2013-03-26 – 2013-03-28 (×5): 1000 mg via INTRAVENOUS
  Filled 2013-03-25 (×7): qty 200

## 2013-03-25 MED ORDER — ACETAMINOPHEN 650 MG RE SUPP: 650.0000 mg | Freq: Four times a day (QID) | RECTAL | Status: DC | PRN

## 2013-03-25 MED ORDER — VANCOMYCIN HCL IN DEXTROSE 1-5 GM/200ML-% IV SOLN
INTRAVENOUS | Status: AC
  Filled 2013-03-25: qty 200

## 2013-03-25 MED ORDER — PIPERACILLIN-TAZOBACTAM 3.375 G IVPB
3.3750 g | Freq: Once | INTRAVENOUS | Status: AC
  Administered 2013-03-25: 3.375 g via INTRAVENOUS
  Filled 2013-03-25: qty 50

## 2013-03-25 MED ORDER — BUDESONIDE-FORMOTEROL FUMARATE 160-4.5 MCG/ACT IN AERO
2.0000 | INHALATION_SPRAY | Freq: Two times a day (BID) | RESPIRATORY_TRACT | Status: DC
  Administered 2013-03-26 – 2013-03-30 (×9): 2 via RESPIRATORY_TRACT
  Filled 2013-03-25: qty 6

## 2013-03-25 MED ORDER — SODIUM CHLORIDE 0.9 % IV SOLN: 250.0000 mL | INTRAVENOUS | Status: DC | PRN

## 2013-03-25 MED ORDER — SODIUM CHLORIDE 0.9 % IJ SOLN: 3.0000 mL | INTRAMUSCULAR | Status: DC | PRN

## 2013-03-25 MED ORDER — ACETAMINOPHEN 325 MG PO TABS: 650.0000 mg | ORAL_TABLET | Freq: Four times a day (QID) | ORAL | Status: DC | PRN

## 2013-03-25 MED ORDER — NITROGLYCERIN 0.4 MG SL SUBL: 0.4000 mg | SUBLINGUAL_TABLET | SUBLINGUAL | Status: DC | PRN

## 2013-03-25 MED ORDER — IPRATROPIUM BROMIDE 0.02 % IN SOLN
0.5000 mg | Freq: Four times a day (QID) | RESPIRATORY_TRACT | Status: DC
  Administered 2013-03-25 – 2013-03-27 (×7): 0.5 mg via RESPIRATORY_TRACT
  Filled 2013-03-25 (×7): qty 2.5

## 2013-03-25 MED ORDER — ALBUTEROL (5 MG/ML) CONTINUOUS INHALATION SOLN
15.0000 mg/h | INHALATION_SOLUTION | Freq: Once | RESPIRATORY_TRACT | Status: AC
  Administered 2013-03-25: 15 mg/h via RESPIRATORY_TRACT
  Filled 2013-03-25: qty 20

## 2013-03-25 MED ORDER — VANCOMYCIN HCL IN DEXTROSE 1-5 GM/200ML-% IV SOLN
1000.0000 mg | Freq: Once | INTRAVENOUS | Status: AC
  Administered 2013-03-25: 1000 mg via INTRAVENOUS
  Filled 2013-03-25: qty 200

## 2013-03-25 MED ORDER — PIPERACILLIN-TAZOBACTAM 3.375 G IVPB
3.3750 g | Freq: Three times a day (TID) | INTRAVENOUS | Status: DC
  Administered 2013-03-26 – 2013-03-30 (×13): 3.375 g via INTRAVENOUS
  Filled 2013-03-25 (×18): qty 50

## 2013-03-25 MED ORDER — SODIUM CHLORIDE 0.9 % IJ SOLN
3.0000 mL | Freq: Two times a day (BID) | INTRAMUSCULAR | Status: DC
  Administered 2013-03-26 – 2013-03-29 (×3): 3 mL via INTRAVENOUS

## 2013-03-25 MED ORDER — SODIUM CHLORIDE 0.9 % IV SOLN
INTRAVENOUS | Status: DC
Start: 2013-03-25 — End: 2013-03-25
  Administered 2013-03-25: 16:00:00 via INTRAVENOUS

## 2013-03-25 MED ORDER — ALPRAZOLAM 1 MG PO TABS
1.0000 mg | ORAL_TABLET | Freq: Two times a day (BID) | ORAL | Status: DC | PRN
  Administered 2013-03-26 – 2013-03-29 (×5): 1 mg via ORAL
  Filled 2013-03-25 (×5): qty 1

## 2013-03-25 MED ORDER — METHYLPREDNISOLONE SODIUM SUCC 125 MG IJ SOLR
125.0000 mg | Freq: Once | INTRAMUSCULAR | Status: AC
  Administered 2013-03-25: 125 mg via INTRAVENOUS
  Filled 2013-03-25: qty 2

## 2013-03-25 MED ORDER — PANTOPRAZOLE SODIUM 40 MG PO TBEC
40.0000 mg | DELAYED_RELEASE_TABLET | Freq: Every day | ORAL | Status: DC
  Administered 2013-03-26 – 2013-03-30 (×5): 40 mg via ORAL
  Filled 2013-03-25 (×5): qty 1

## 2013-03-25 MED ORDER — ASPIRIN EC 81 MG PO TBEC
81.0000 mg | DELAYED_RELEASE_TABLET | Freq: Every day | ORAL | Status: DC
  Administered 2013-03-26 – 2013-03-30 (×5): 81 mg via ORAL
  Filled 2013-03-25 (×5): qty 1

## 2013-03-25 MED ORDER — BUDESONIDE-FORMOTEROL FUMARATE 160-4.5 MCG/ACT IN AERO
INHALATION_SPRAY | RESPIRATORY_TRACT | Status: AC
  Filled 2013-03-25: qty 6

## 2013-03-25 MED ORDER — PIPERACILLIN-TAZOBACTAM 3.375 G IVPB
INTRAVENOUS | Status: AC
  Filled 2013-03-25: qty 50

## 2013-03-25 MED ORDER — SODIUM CHLORIDE 0.9 % IV BOLUS (SEPSIS)
500.0000 mL | Freq: Once | INTRAVENOUS | Status: AC
  Administered 2013-03-25: 500 mL via INTRAVENOUS

## 2013-03-25 MED ORDER — IPRATROPIUM BROMIDE 0.02 % IN SOLN
0.5000 mg | Freq: Once | RESPIRATORY_TRACT | Status: AC
  Administered 2013-03-25: 0.5 mg via RESPIRATORY_TRACT
  Filled 2013-03-25: qty 2.5

## 2013-03-25 MED ORDER — SIMVASTATIN 20 MG PO TABS
40.0000 mg | ORAL_TABLET | Freq: Every day | ORAL | Status: DC
  Administered 2013-03-25 – 2013-03-29 (×5): 40 mg via ORAL
  Filled 2013-03-25 (×5): qty 2

## 2013-03-25 MED ORDER — ALBUTEROL SULFATE (5 MG/ML) 0.5% IN NEBU
2.5000 mg | INHALATION_SOLUTION | Freq: Four times a day (QID) | RESPIRATORY_TRACT | Status: DC
  Administered 2013-03-25 – 2013-03-27 (×7): 2.5 mg via RESPIRATORY_TRACT
  Filled 2013-03-25 (×7): qty 0.5

## 2013-03-25 NOTE — Progress Notes (Addendum)
ANTIBIOTIC CONSULT NOTE - INITIAL  Pharmacy Consult for Vancomycin & Zosyn Indication: pneumonia  Allergies  Allergen Reactions  . Iohexol Hives     Code: HIVES, Desc: PT STATES HE BROKE OUT IN HIVES AND RASH AFTER CT NECK EARLY SEPT 2011; NO RESP PROBLEMS; NEEDS PRE-MEDS; MKS, Onset Date: [id number]     Patient Measurements: Height: 5\' 5"  (165.1 cm) Weight: 167 lb (75.751 kg) IBW/kg (Calculated) : 61.5  Vital Signs: Temp: 98.1 F (36.7 C) (12/11 2224) Temp src: Oral (12/11 2224) BP: 105/66 mmHg (12/11 2224) Pulse Rate: 91 (12/11 2224) Intake/Output from previous day:   Intake/Output from this shift:    Labs:  Recent Labs  03/25/13 1550  WBC 21.2*  HGB 13.2  PLT 247  CREATININE 0.79   Estimated Creatinine Clearance: 80.5 ml/min (by C-G formula based on Cr of 0.79). No results found for this basename: VANCOTROUGH, VANCOPEAK, VANCORANDOM, GENTTROUGH, GENTPEAK, GENTRANDOM, TOBRATROUGH, TOBRAPEAK, TOBRARND, AMIKACINPEAK, AMIKACINTROU, AMIKACIN,  in the last 72 hours   Microbiology: No results found for this or any previous visit (from the past 720 hour(s)).  Medical History: Past Medical History  Diagnosis Date  . COPD (chronic obstructive pulmonary disease)   . Asthma   . Tobacco abuse 09/29/2011  . Hypercholesteremia   . GERD (gastroesophageal reflux disease)   . Headache(784.0)   . BPH (benign prostatic hyperplasia) 10/01/2011    Per cystoscopy  . 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  . Pulmonary nodule 09/2011    Stable appearance  . Chronic back pain   . Chronic anxiety   . Hyperlipidemia 03/13/2012  . Anxiety 03/13/2012    Medications:  Scheduled:  . albuterol  2.5 mg Nebulization Q6H  . [START ON 03/26/2013] aspirin EC  81 mg Oral Daily  . budesonide-formoterol  2 puff Inhalation BID  . ipratropium  0.5 mg Nebulization Q6H  . [START ON 03/26/2013] pantoprazole  40 mg Oral Daily  . piperacillin-tazobactam  3.375 g Intravenous  Once  . simvastatin  40 mg Oral q1800  . sodium chloride  3 mL Intravenous Q12H   Assessment: 72 yo M with RLL PNA per CXR.  He was admitted at [facility] ~ 1 month ago with PNA.   Today he has low grade fever & elevated WBC on admission.   Renal function is at patient's baseline.   Vancomycin 12/11>> Zosyn 12/11>>  Goal of Therapy:  Vancomycin trough level 15-20 mcg/ml  Plan:  Zosyn 3.375gm IV Q8h to be infused over 4hrs Vancomycin 1gm IV q12h Check Vancomycin trough at steady state Monitor renal function and cx data   Elson Clan 03/25/2013,10:42 PM

## 2013-03-25 NOTE — Progress Notes (Signed)
Sputum specimen cup given to patient, explained to patient when he felt like he could cough up something to cough up in the trap.

## 2013-03-25 NOTE — ED Notes (Signed)
Pt ambulated on 3L O2 [place], sats constant at 87%

## 2013-03-25 NOTE — H&P (Signed)
PCP:   Remus Loffler, PA-C   Chief Complaint:  sob  HPI: 72 yo male h/o CRP from copd on 3 L [place] at home continuous comes in with worsening sob, low grade fever, coughing.  On steroids and abx a lot.  No n/v/d.  No dysuria.  Feels better with several nebs in ED.  No rashes.  No nasal congestion.  Review of Systems:  Positive and negative as per HPI otherwise all other systems are negative  Past Medical History: Past Medical History  Diagnosis Date  . COPD (chronic obstructive pulmonary disease)   . Asthma   . Tobacco abuse 09/29/2011  . Hypercholesteremia   . GERD (gastroesophageal reflux disease)   . Headache(784.0)   . BPH (benign prostatic hyperplasia) 10/01/2011    Per cystoscopy  . 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  . Pulmonary nodule 09/2011    Stable appearance  . Chronic back pain   . Chronic anxiety   . Hyperlipidemia 03/13/2012  . Anxiety 03/13/2012   Past Surgical History  Procedure Laterality Date  . Cardiac catheterization  2003    [facility]  . Fracture surgery      R ring finger, pin placed  . Cystoscopy  09/30/2011    Procedure: CYSTOSCOPY FLEXIBLE;  Surgeon: Ky Barban, MD;  Location: [facility];  Service: Urology;  Laterality: N/A;  . Transurethral resection of bladder tumor  10/01/2011    Procedure: TRANSURETHRAL RESECTION OF BLADDER TUMOR (TURBT);  Surgeon: Ky Barban, MD;  Location: [facility];  Service: Urology;  Laterality: N/A;    Medications: Prior to Admission medications   Medication Sig Start Date End Date Taking? Authorizing Provider  albuterol (PROVENTIL) (2.5 MG/3ML) 0.083% nebulizer solution Take 2.5 mg by nebulization every 4 (four) hours as needed. For shortness of breath   Yes Historical Provider, MD  albuterol-ipratropium (COMBIVENT) 18-103 MCG/ACT inhaler Inhale 2 puffs into the lungs 4 (four) times daily.     Yes Historical Provider, MD  ALPRAZolam Prudy Feeler) 1 MG tablet Take 1 tablet (1 mg total) by mouth 2  (two) times daily as needed. anxiety 02/20/13  Yes Zannie Cove, MD  aspirin EC 81 MG tablet Take 81 mg by mouth daily. 10/02/11  Yes Elliot Cousin, MD  budesonide-formoterol [facility]) 160-4.5 MCG/ACT inhaler Inhale 2 puffs into the lungs 2 (two) times daily.     Yes Historical Provider, MD  Cholecalciferol (VITAMIN D3) 1000 UNITS CAPS Take 1 capsule by mouth daily.    Yes Historical Provider, MD  Garlic 1000 MG CAPS Take 1 capsule by mouth daily.    Yes Historical Provider, MD  Multiple Vitamins-Minerals (CENTRUM) tablet Take 1 tablet by mouth daily.    Yes Historical Provider, MD  Omega-3 Fatty Acids (FISH OIL PO) Take 1 capsule by mouth daily.   Yes Historical Provider, MD  pantoprazole (PROTONIX) 40 MG tablet Take 40 mg by mouth daily.   Yes Historical Provider, MD  simvastatin (ZOCOR) 40 MG tablet Take 40 mg by mouth daily.    Yes Historical Provider, MD  NITROSTAT 0.4 MG SL tablet Place 1 tablet under the tongue as needed for chest pain.  02/22/13   Historical Provider, MD    Allergies:   Allergies  Allergen Reactions  . Iohexol Hives     Code: HIVES, Desc: PT STATES HE BROKE OUT IN HIVES AND RASH AFTER CT NECK EARLY SEPT 2011; NO RESP PROBLEMS; NEEDS PRE-MEDS; MKS, Onset Date: [id number]  Social History:  reports that he quit smoking about 20 months ago. His smoking use included Cigarettes. He has a 58 pack-year smoking history. He has quit using smokeless tobacco. He reports that he does not drink alcohol or use illicit drugs.  Family History: Family History  Problem Relation Age of Onset  . Emphysema Brother     was not close to family; does not truly know their medical problems.  . Emphysema Brother   . Emphysema Brother   . Cancer Neg Hx     Physical Exam: Filed Vitals:   03/25/13 1451 03/25/13 1618 03/25/13 1931  BP: 114/80  111/67  Pulse: 92  98  Temp: 98 F (36.7 C)  98.2 F (36.8 C)  TempSrc: Oral  Oral  Resp: 18  24  Height: 5\' 5"  (1.651 m)    Weight:  75.751 kg (167 lb)    SpO2: 95% 92% 92%   General appearance: alert, cooperative and no distress Head: Normocephalic, without obvious abnormality, atraumatic Eyes: negative Nose: Nares normal. Septum midline. Mucosa normal. No drainage or sinus tenderness. Neck: no JVD and supple, symmetrical, trachea midline Lungs: diminished breath sounds bilaterally Heart: regular rate and rhythm, S1, S2 normal, no murmur, click, rub or gallop Abdomen: soft, non-tender; bowel sounds normal; no masses,  no organomegaly Extremities: extremities normal, atraumatic, no cyanosis or edema Pulses: 2+ and symmetric Skin: Skin color, texture, turgor normal. No rashes or lesions Neurologic: Grossly normal    Labs on Admission:   Recent Labs  03/25/13 1550  NA 137  K 3.9  CL 95*  CO2 32  GLUCOSE 97  BUN 7  CREATININE 0.79  CALCIUM 10.0    Recent Labs  03/25/13 1550  AST 15  ALT 12  ALKPHOS 88  BILITOT 0.4  PROT 7.7  ALBUMIN 3.4*    Recent Labs  03/25/13 1550  WBC 21.2*  NEUTROABS 15.4*  HGB 13.2  HCT 40.6  MCV 96.2  PLT 247    Radiological Exams on Admission: Dg Chest 2 View  03/25/2013   CLINICAL DATA:  Cough, shortness of breath, and fever.  EXAM: CHEST  2 VIEW  COMPARISON:  02/16/2013  FINDINGS: The cardiomediastinal silhouette is within normal limits. The patient has taken a slightly greater inspiration. Right lower lobe airspace opacity on the prior study has nearly completely resolved. Diffuse interstitial opacity throughout both lungs is similar to the 10/18/2012 exam and compatible with chronic underlying lung disease. There is no evidence of new segmental airspace consolidation, edema, or pleural effusion. No acute osseous abnormality is seen.  IMPRESSION: Interval clearing right lower lobe pneumonia. Chronic interstitial changes without definite evidence of new airspace disease.   Electronically Signed   By: Sebastian Ache   On: 03/25/2013 16:12    Assessment/Plan  72 yo  male with acute on chronic resp failure  Principal Problem:   Acute and chronic respiratory failure (acute-on-chronic)- cover empirically for pna with vanco and zosyn.  sput cx pending.  Nebs. Lungs sound quite clear now, will hold of on any further steroids at this time.  Oxygen.    Active Problems:   HYPERTENSION   C O P D   COPD exacerbation   Anxiety   Hyperlipidemia   Chronic back pain    Lyncoln Maskell A 03/25/2013, 7:51 PM

## 2013-03-25 NOTE — ED Provider Notes (Signed)
CSN: [id number]     Arrival date & time 03/25/13  1430 History   First MD Initiated Contact with Patient 03/25/13 1456   This chart was scribed for [name], MD by Valera Castle, ED Scribe. This patient was seen in room APA19/APA19 and the patient's care was started at 3:38 PM.    No chief complaint on file.   The history is provided by the patient. No language interpreter was used.   HPI Comments: [name] [name] is a 72 y.o. male brought in with his wife, with h/o COPD who presents to the Emergency Department complaining of wheezing, weakness, and fatigue, onset 2 days ago. He states his nebulizer helps but it doesn't last long His wife reports pt has had fever, max temperature today 100.4. He reports cough for some time, productive of gray, white sputum chronically, but the past few days it is yellow. He reports rhinorrhea, with white discharge, along with sneezing. He reports he has been eating and drinking normally. He reports being discharged from the hospital last month for pneumonia. He reports being worried he will be told today his has pneumonia. He reports being on O2 at home, and wearing 3-4 LPM [place], onset 2 days ago due to trouble breathing. He reports he is usually on 2 LPM [place]. He denies being on Prednisone regularly, stating he only takes it when he is sick. He denies chills, emesis, diarrhea, sore throat, LE edema, and any other associated symptoms. He reports smoking 1 PPD.   PCP - Remus Loffler PA-C   Past Medical History  Diagnosis Date  . COPD (chronic obstructive pulmonary disease)   . Asthma   . Tobacco abuse 09/29/2011  . Hypercholesteremia   . GERD (gastroesophageal reflux disease)   . Headache(784.0)   . BPH (benign prostatic hyperplasia) 10/01/2011    Per cystoscopy  . 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  . Pulmonary nodule 09/2011    Stable appearance  . Chronic back pain   . Chronic anxiety   . Hyperlipidemia 03/13/2012  . Anxiety  03/13/2012   Past Surgical History  Procedure Laterality Date  . Cardiac catheterization  2003    [facility]  . Fracture surgery      R ring finger, pin placed  . Cystoscopy  09/30/2011    Procedure: CYSTOSCOPY FLEXIBLE;  Surgeon: Ky Barban, MD;  Location: [facility];  Service: Urology;  Laterality: N/A;  . Transurethral resection of bladder tumor  10/01/2011    Procedure: TRANSURETHRAL RESECTION OF BLADDER TUMOR (TURBT);  Surgeon: Ky Barban, MD;  Location: [facility];  Service: Urology;  Laterality: N/A;   Family History  Problem Relation Age of Onset  . Emphysema Brother     was not close to family; does not truly know their medical problems.  . Emphysema Brother   . Emphysema Brother   . Cancer Neg Hx    History  Substance Use Topics  . Smoking status: Former Smoker -- 1.00 packs/day for 58 years    Types: Cigarettes    Quit date: 07/04/2011  . Smokeless tobacco: Former Neurosurgeon  . Alcohol Use: No  lives at home Lives with spouse Uses oxygen 2 lpm  Smokes 1 ppd  Review of Systems  Constitutional: Positive for fever. Negative for chills and appetite change.  HENT: Positive for rhinorrhea (white discharge) and sneezing. Negative for sore throat.   Respiratory: Positive for cough (gray, white sputum), shortness of breath and wheezing.  Cardiovascular: Negative for leg swelling.  Gastrointestinal: Negative for nausea, vomiting and diarrhea.  All other systems reviewed and are negative.    Allergies  Iohexol  Home Medications   Current Outpatient Rx  Name  Route  Sig  Dispense  Refill  . albuterol (PROVENTIL) (2.5 MG/3ML) 0.083% nebulizer solution   Nebulization   Take 2.5 mg by nebulization every 4 (four) hours as needed. For shortness of breath         . albuterol-ipratropium (COMBIVENT) 18-103 MCG/ACT inhaler   Inhalation   Inhale 2 puffs into the lungs 4 (four) times daily.           Marland Kitchen ALPRAZolam (XANAX) 1 MG tablet   Oral   Take 1 tablet (1 mg total)  by mouth 2 (two) times daily as needed. anxiety   20 tablet   0   . aspirin EC 81 MG tablet   Oral   Take 81 mg by mouth daily.         . budesonide-formoterol (SYMBICORT) 160-4.5 MCG/ACT inhaler   Inhalation   Inhale 2 puffs into the lungs 2 (two) times daily.           . Cholecalciferol (VITAMIN D3) 1000 UNITS CAPS   Oral   Take 1 capsule by mouth daily.          . Garlic 1000 MG CAPS   Oral   Take 1 capsule by mouth daily.          Marland Kitchen HYDROcodone-acetaminophen (NORCO) 10-325 MG per tablet   Oral   Take 1 tablet by mouth every 6 (six) hours as needed.   30 tablet   0   . levofloxacin (LEVAQUIN) 500 MG tablet   Oral   Take 1 tablet (500 mg total) by mouth daily. For 3 days   3 tablet   0   . Multiple Vitamins-Minerals (CENTRUM) tablet   Oral   Take 1 tablet by mouth daily.          . Omega-3 Fatty Acids (FISH OIL PO)   Oral   Take 1 capsule by mouth daily.         . pantoprazole (PROTONIX) 40 MG tablet   Oral   Take 40 mg by mouth daily.         . predniSONE (DELTASONE) 20 MG tablet      Take 60mg  for 2 days then 40mg  for 2 days then 20mg  for 2 days then STOP   12 tablet   0   . simvastatin (ZOCOR) 40 MG tablet   Oral   Take 40 mg by mouth daily.           BP 114/80  Pulse 92  Temp(Src) 98 F (36.7 C) (Oral)  Resp 18  Ht 5\' 5"  (1.651 m)  Wt 167 lb (75.751 kg)  BMI 27.79 kg/m2  SpO2 95%  Vital signs normal    Physical Exam  Nursing note and vitals reviewed. Constitutional: He is oriented to person, place, and time. He appears well-developed and well-nourished.  Non-toxic appearance. He does not appear ill. No distress.  HENT:  Head: Normocephalic and atraumatic.  Right Ear: External ear normal.  Left Ear: External ear normal.  Nose: Nose normal. No mucosal edema or rhinorrhea.  Mouth/Throat: Oropharynx is clear and moist and mucous membranes are normal. No dental abscesses or uvula swelling.  Tongue is dry.  Eyes:  Conjunctivae and EOM are normal. Pupils are equal, round, and reactive to light.  Neck: Normal range of motion and full passive range of motion without pain. Neck supple.  Cardiovascular: Normal rate, regular rhythm and normal heart sounds.  Exam reveals no gallop and no friction rub.   No murmur heard. Pulmonary/Chest: Effort normal. No respiratory distress. He has wheezes (diffuse wheezing). He has no rhonchi. He has no rales. He exhibits no tenderness and no crepitus.  Abdominal: Soft. Normal appearance and bowel sounds are normal. He exhibits no distension. There is no tenderness. There is no rebound and no guarding.  Musculoskeletal: Normal range of motion. He exhibits no edema and no tenderness.  Moves all extremities well.   Neurological: He is alert and oriented to person, place, and time. He has normal strength. No cranial nerve deficit.  Skin: Skin is warm, dry and intact. No rash noted. No erythema. No pallor.  Psychiatric: He has a normal mood and affect. His speech is normal and behavior is normal. His mood appears not anxious.    ED Course  Procedures (including critical care time)  Medications  0.9 %  sodium chloride infusion ( Intravenous New Bag/Given 03/25/13 1558)  vancomycin (VANCOCIN) IVPB 1000 mg/200 mL premix (1,000 mg Intravenous New Bag/Given 03/25/13 1928)  piperacillin-tazobactam (ZOSYN) IVPB 3.375 g (not administered)  methylPREDNISolone sodium succinate (SOLU-MEDROL) 125 mg/2 mL injection 125 mg (125 mg Intravenous Given 03/25/13 1558)  albuterol (PROVENTIL,VENTOLIN) solution continuous neb (15 mg/hr Nebulization Given 03/25/13 1618)  ipratropium (ATROVENT) nebulizer solution 0.5 mg (0.5 mg Nebulization Given 03/25/13 1617)  sodium chloride 0.9 % bolus 500 mL (0 mLs Intravenous Stopped 03/25/13 1834)     DIAGNOSTIC STUDIES: Oxygen Saturation is 95% on oxygen, normal by my interpretation.    COORDINATION OF CARE: 3:46 PM-Discussed treatment plan which  includes blood work, CXR, IV steriods, IV fluids, and breathing treatment with pt at bedside and pt agreed to plan.   4:38 PM - Discussed imaging results with pt, stating xray negative for pneumonia. Discussed with pt that his WBC is high.   Pt is feeling better after the nebulizer. He has improved air movement, rare scattered wheeze.   6:27 PM - Pt ambulated by nursing staff. Pt's highest pulse ox when walking was 87% on 3 lpm [place], he states his baseline is 90%.   19:27 Dr Onalee Hua, will see patient and do her own orders  Labs Review Results for orders placed during the hospital encounter of 03/25/13  CBC WITH DIFFERENTIAL      Result Value Range   WBC 21.2 (*) 4.0 - 10.5 K/uL   RBC 4.22  4.22 - 5.81 MIL/uL   Hemoglobin 13.2  13.0 - 17.0 g/dL   HCT 46.9  62.9 - 52.8 %   MCV 96.2  78.0 - 100.0 fL   MCH 31.3  26.0 - 34.0 pg   MCHC 32.5  30.0 - 36.0 g/dL   RDW 41.3  24.4 - 01.0 %   Platelets 247  150 - 400 K/uL   Neutrophils Relative % 73  43 - 77 %   Neutro Abs 15.4 (*) 1.7 - 7.7 K/uL   Lymphocytes Relative 15  12 - 46 %   Lymphs Abs 3.2  0.7 - 4.0 K/uL   Monocytes Relative 12  3 - 12 %   Monocytes Absolute 2.5 (*) 0.1 - 1.0 K/uL   Eosinophils Relative 0  0 - 5 %   Eosinophils Absolute 0.1  0.0 - 0.7 K/uL   Basophils Relative 0  0 - 1 %   Basophils  Absolute 0.0  0.0 - 0.1 K/uL  COMPREHENSIVE METABOLIC PANEL      Result Value Range   Sodium 137  135 - 145 mEq/L   Potassium 3.9  3.5 - 5.1 mEq/L   Chloride 95 (*) 96 - 112 mEq/L   CO2 32  19 - 32 mEq/L   Glucose, Bld 97  70 - 99 mg/dL   BUN 7  6 - 23 mg/dL   Creatinine, Ser 1.61  0.50 - 1.35 mg/dL   Calcium 09.6  8.4 - 04.5 mg/dL   Total Protein 7.7  6.0 - 8.3 g/dL   Albumin 3.4 (*) 3.5 - 5.2 g/dL   AST 15  0 - 37 U/L   ALT 12  0 - 53 U/L   Alkaline Phosphatase 88  39 - 117 U/L   Total Bilirubin 0.4  0.3 - 1.2 mg/dL   GFR calc non Af Amer 88 (*) >90 mL/min   GFR calc Af Amer >90  >90 mL/min   Laboratory interpretation all  normal except leukocytosis (elevated for the past year).    Imaging Review Dg Chest 2 View  03/25/2013   CLINICAL DATA:  Cough, shortness of breath, and fever.  EXAM: CHEST  2 VIEW  COMPARISON:  02/16/2013  FINDINGS: The cardiomediastinal silhouette is within normal limits. The patient has taken a slightly greater inspiration. Right lower lobe airspace opacity on the prior study has nearly completely resolved. Diffuse interstitial opacity throughout both lungs is similar to the 10/18/2012 exam and compatible with chronic underlying lung disease. There is no evidence of new segmental airspace consolidation, edema, or pleural effusion. No acute osseous abnormality is seen.  IMPRESSION: Interval clearing right lower lobe pneumonia. Chronic interstitial changes without definite evidence of new airspace disease.   Electronically Signed   By: Sebastian Ache   On: 03/25/2013 16:12    EKG Interpretation   None         MDM   1. COPD with exacerbation   2. Bronchitis   3. Hypoxia     Plan admission  Devoria Albe, MD, FACEP   I personally performed the services described in this documentation, which was scribed in my presence. The recorded information has been reviewed and considered.  Devoria Albe, MD, Armando Gang   [name], MD 03/25/13 [phone]

## 2013-03-25 NOTE — ED Notes (Signed)
Temp of 100, and wife paniced per pt. Pt states thermostat was setting on 80. Pt states he seems to be breathing harder than usual. Recently discharged from hospital for pneumonia. Pt states he is on O2 at 3L at all times. NAD at this time.

## 2013-03-26 DIAGNOSIS — R739 Hyperglycemia, unspecified: Secondary | ICD-10-CM | POA: Diagnosis present

## 2013-03-26 DIAGNOSIS — J9620 Acute and chronic respiratory failure, unspecified whether with hypoxia or hypercapnia: Secondary | ICD-10-CM

## 2013-03-26 DIAGNOSIS — T50904A Poisoning by unspecified drugs, medicaments and biological substances, undetermined, initial encounter: Secondary | ICD-10-CM

## 2013-03-26 DIAGNOSIS — J441 Chronic obstructive pulmonary disease with (acute) exacerbation: Secondary | ICD-10-CM

## 2013-03-26 DIAGNOSIS — J189 Pneumonia, unspecified organism: Secondary | ICD-10-CM | POA: Diagnosis present

## 2013-03-26 DIAGNOSIS — R7309 Other abnormal glucose: Secondary | ICD-10-CM

## 2013-03-26 LAB — CBC
HCT: 37.5 % — ABNORMAL LOW (ref 39.0–52.0)
Platelets: 268 10*3/uL (ref 150–400)
RBC: 3.91 MIL/uL — ABNORMAL LOW (ref 4.22–5.81)
RDW: 14 % (ref 11.5–15.5)
WBC: 17.8 10*3/uL — ABNORMAL HIGH (ref 4.0–10.5)

## 2013-03-26 LAB — EXPECTORATED SPUTUM ASSESSMENT W GRAM STAIN, RFLX TO RESP C

## 2013-03-26 LAB — GLUCOSE, CAPILLARY: Glucose-Capillary: 216 mg/dL — ABNORMAL HIGH (ref 70–99)

## 2013-03-26 LAB — HEMOGLOBIN A1C: Hgb A1c MFr Bld: 5.7 % — ABNORMAL HIGH (ref <5.7)

## 2013-03-26 LAB — BASIC METABOLIC PANEL
CO2: 29 mEq/L (ref 19–32)
Calcium: 9.8 mg/dL (ref 8.4–10.5)
Chloride: 99 mEq/L (ref 96–112)
GFR calc Af Amer: >90 mL/min (ref >90)
GFR calc non Af Amer: >90 mL/min (ref >90)
Glucose, Bld: 208 mg/dL — ABNORMAL HIGH (ref 70–99)
Potassium: 4.8 mEq/L (ref 3.5–5.1)
Sodium: 136 mEq/L (ref 135–145)

## 2013-03-26 MED ORDER — METHYLPREDNISOLONE SODIUM SUCC 125 MG IJ SOLR
60.0000 mg | Freq: Four times a day (QID) | INTRAMUSCULAR | Status: DC
  Administered 2013-03-26 – 2013-03-30 (×16): 60 mg via INTRAVENOUS
  Filled 2013-03-26 (×16): qty 2

## 2013-03-26 MED ORDER — HYDROCODONE-ACETAMINOPHEN 10-325 MG PO TABS
1.0000 | ORAL_TABLET | Freq: Four times a day (QID) | ORAL | Status: DC | PRN
  Administered 2013-03-26 – 2013-03-30 (×7): 1 via ORAL
  Filled 2013-03-26 (×7): qty 1

## 2013-03-26 MED ORDER — INSULIN ASPART 100 UNIT/ML ~~LOC~~ SOLN
0.0000 [IU] | Freq: Every day | SUBCUTANEOUS | Status: DC
  Administered 2013-03-26: 2 [IU] via SUBCUTANEOUS

## 2013-03-26 MED ORDER — INSULIN ASPART 100 UNIT/ML ~~LOC~~ SOLN
0.0000 [IU] | Freq: Three times a day (TID) | SUBCUTANEOUS | Status: DC
  Administered 2013-03-26: 3 [IU] via SUBCUTANEOUS
  Administered 2013-03-26: 1 [IU] via SUBCUTANEOUS
  Administered 2013-03-27: 3 [IU] via SUBCUTANEOUS
  Administered 2013-03-27: 2 [IU] via SUBCUTANEOUS

## 2013-03-26 NOTE — Progress Notes (Signed)
Utilization Review Complete  

## 2013-03-26 NOTE — Care Management Note (Addendum)
    Page 1 of 1   03/29/2013     1:15:27 PM   CARE MANAGEMENT NOTE 03/29/2013  Patient:  [name] [name], [name] [name]   Account Number:  [id number]  Date Initiated:  03/26/2013  Documentation initiated by:  Rosemary Holms  Subjective/Objective Assessment:   Pt admitted from home where he lives with his wife. Pt declined home health. "Waste of money"     Action/Plan:   Anticipated DC Date:  03/28/2013   Anticipated DC Plan:  HOME/SELF CARE      DC Planning Services  CM consult      Choice offered to / List presented to:             Status of service:  Completed, signed off Medicare Important Message given?   (If response is "NO", the following Medicare IM given date fields will be blank) Date Medicare IM given:   Date Additional Medicare IM given:    Discharge Disposition:    Per UR Regulation:    If discussed at Long Length of Stay Meetings, dates discussed:    Comments:  03/29/13 Rosemary Holms RN BSN CM Pt still declines HH. [facility] does home O2.  03/26/13 Rosemary Holms RN BSN CM

## 2013-03-26 NOTE — Progress Notes (Signed)
[facility] PROGRESS NOTE  [name] [name] ZOX:[id number] DOB: 09-10-1940 DOA: 03/25/2013 PCP: Remus Loffler, PA-C    Code Status: Full code Family Communication: Family not available Disposition Plan: Discharge to home when clinically appropriate.   Consultants:  None  Procedures:  None  Antibiotics:  Vancomycin 03/26/2013  Zosyn 03/26/2013  HPI/Subjective: The patient says he is breathing better. He still has some chest congestion. He has a productive cough with yellow sputum. He denies subjective fever or chills.  Objective: Filed Vitals:   03/26/13 1439  BP: 142/63  Pulse: 90  Temp: 97.8 F (36.6 C)  Resp: 20   Oxygen saturation 94% on 2.5 L.   Intake/Output Summary (Last 24 hours) at 03/26/13 1608 Last data filed at 03/26/13 1300  Gross per 24 hour  Intake    483 ml  Output   1750 ml  Net  -1267 ml   Filed Weights   03/25/13 1451 03/25/13 2224  Weight: 75.751 kg (167 lb) 76.9 kg (169 lb 8.5 oz)    Exam:   General:  Alert 72 year old Caucasian man sitting up in bed, in no acute distress.  Cardiovascular: S1, S2, with no murmurs rubs or gallops.  Respiratory: Scattered crackles and wheezes, good air movement down to the bases.  Abdomen: Positive bowel sounds, soft, nontender, nondistended.  Musculoskeletal/extremities: Pedal pulses palpable. No pedal edema.  Neurologic: He is alert and oriented x3. Cranial nerves II through XII are intact.  Data Reviewed: Basic Metabolic Panel:  Recent Labs Lab 03/25/13 1550 03/26/13 0532  NA 137 136  K 3.9 4.8  CL 95* 99  CO2 32 29  GLUCOSE 97 208*  BUN 7 11  CREATININE 0.79 0.71  CALCIUM 10.0 9.8   Liver Function Tests:  Recent Labs Lab 03/25/13 1550  AST 15  ALT 12  ALKPHOS 88  BILITOT 0.4  PROT 7.7  ALBUMIN 3.4*   No results found for this basename: LIPASE, AMYLASE,  in the last 168 hours No results found for this basename: AMMONIA,  in the last 168 hours CBC:  Recent  Labs Lab 03/25/13 1550 03/26/13 0532  WBC 21.2* 17.8*  NEUTROABS 15.4*  --   HGB 13.2 12.8*  HCT 40.6 37.5*  MCV 96.2 95.9  PLT 247 268   Cardiac Enzymes: No results found for this basename: CKTOTAL, CKMB, CKMBINDEX, TROPONINI,  in the last 168 hours BNP (last 3 results) No results found for this basename: PROBNP,  in the last 8760 hours CBG:  Recent Labs Lab 03/26/13 1306  GLUCAP 216*    Recent Results (from the past 240 hour(s))  CULTURE, EXPECTORATED SPUTUM-ASSESSMENT     Status: None   Collection Time    03/26/13  9:33 AM      Result Value Range Status   Specimen Description SPU   Final   Special Requests NONE   Final   Sputum evaluation     Final   Value: THIS SPECIMEN IS ACCEPTABLE. RESPIRATORY CULTURE REPORT TO FOLLOW.   Report Status 03/26/2013 FINAL   Final     Studies: Dg Chest 2 View  03/25/2013   CLINICAL DATA:  Cough, shortness of breath, and fever.  EXAM: CHEST  2 VIEW  COMPARISON:  02/16/2013  FINDINGS: The cardiomediastinal silhouette is within normal limits. The patient has taken a slightly greater inspiration. Right lower lobe airspace opacity on the prior study has nearly completely resolved. Diffuse interstitial opacity throughout both lungs is similar to the 10/18/2012 exam and compatible with  chronic underlying lung disease. There is no evidence of new segmental airspace consolidation, edema, or pleural effusion. No acute osseous abnormality is seen.  IMPRESSION: Interval clearing right lower lobe pneumonia. Chronic interstitial changes without definite evidence of new airspace disease.   Electronically Signed   By: Sebastian Ache   On: 03/25/2013 16:12    Scheduled Meds: . albuterol  2.5 mg Nebulization Q6H  . aspirin EC  81 mg Oral Daily  . budesonide-formoterol  2 puff Inhalation BID  . insulin aspart  0-5 Units Subcutaneous QHS  . insulin aspart  0-9 Units Subcutaneous TID WC  . ipratropium  0.5 mg Nebulization Q6H  . methylPREDNISolone  (SOLU-MEDROL) injection  60 mg Intravenous Q6H  . pantoprazole  40 mg Oral Daily  . piperacillin-tazobactam (ZOSYN)  IV  3.375 g Intravenous Q8H  . simvastatin  40 mg Oral q1800  . sodium chloride  3 mL Intravenous Q12H  . vancomycin  1,000 mg Intravenous Q12H   Continuous Infusions:   Assessment: The patient has improved clinically and symptomatically compared to last night. He is afebrile and his white blood cell count is elevated, but decreasing. In part, the leukocytosis is secondary to steroid therapy. His chest x-ray reveals interval clearing of right lower lobe pneumonia, but with chronic interstitial changes. He was given Solu-Medrol on admission. Solu-Medrol has been restarted as the patient has scattered bronchospasms on exam.  Principal Problem:   Acute and chronic respiratory failure (acute-on-chronic) Active Problems:   C O P D   COPD exacerbation   Anxiety   HYPERTENSION   Hyperlipidemia   Chronic back pain   Hyperglycemia, drug-induced    Plan:  1. As above. We'll continue IV Solu-Medrol, broad-spectrum antibiotic therapy with Zosyn and vancomycin. We'll continue nebulizers and change to every 4 hours. Continue Symbicort. 2. Sliding scale NovoLog has been added for steroid-induced hyperglycemia. 3. Sputum culture pending.  Time spent: 35 minutes.    [facility]  [facility] Pager [phone]. If 7PM-7AM, please contact night-coverage at [email or web address], password [facility] 03/26/2013, 4:08 PM  LOS: 1 day

## 2013-03-27 LAB — CBC
HCT: 36.6 % — ABNORMAL LOW (ref 39.0–52.0)
MCHC: 33.6 g/dL (ref 30.0–36.0)
Platelets: 283 10*3/uL (ref 150–400)
RBC: 3.86 MIL/uL — ABNORMAL LOW (ref 4.22–5.81)
RDW: 14.1 % (ref 11.5–15.5)

## 2013-03-27 LAB — GLUCOSE, CAPILLARY: Glucose-Capillary: 239 mg/dL — ABNORMAL HIGH (ref 70–99)

## 2013-03-27 LAB — BASIC METABOLIC PANEL
GFR calc Af Amer: >90 mL/min (ref >90)
GFR calc non Af Amer: >90 mL/min (ref >90)
Potassium: 4.3 mEq/L (ref 3.5–5.1)
Sodium: 140 mEq/L (ref 135–145)

## 2013-03-27 MED ORDER — DM-GUAIFENESIN ER 30-600 MG PO TB12
1.0000 | ORAL_TABLET | Freq: Two times a day (BID) | ORAL | Status: DC
  Administered 2013-03-27 – 2013-03-30 (×7): 1 via ORAL
  Filled 2013-03-27 (×7): qty 1

## 2013-03-27 MED ORDER — IPRATROPIUM BROMIDE 0.02 % IN SOLN
0.5000 mg | RESPIRATORY_TRACT | Status: DC
  Administered 2013-03-27 – 2013-03-30 (×13): 0.5 mg via RESPIRATORY_TRACT
  Filled 2013-03-27 (×17): qty 2.5

## 2013-03-27 MED ORDER — BENZONATATE 100 MG PO CAPS
100.0000 mg | ORAL_CAPSULE | Freq: Three times a day (TID) | ORAL | Status: DC
  Administered 2013-03-27 – 2013-03-30 (×8): 100 mg via ORAL
  Filled 2013-03-27 (×8): qty 1

## 2013-03-27 MED ORDER — INSULIN ASPART 100 UNIT/ML ~~LOC~~ SOLN
0.0000 [IU] | Freq: Three times a day (TID) | SUBCUTANEOUS | Status: DC
  Administered 2013-03-27: 2 [IU] via SUBCUTANEOUS
  Administered 2013-03-28: 5 [IU] via SUBCUTANEOUS
  Administered 2013-03-28: 3 [IU] via SUBCUTANEOUS

## 2013-03-27 MED ORDER — INSULIN ASPART 100 UNIT/ML ~~LOC~~ SOLN
0.0000 [IU] | Freq: Every day | SUBCUTANEOUS | Status: DC
  Administered 2013-03-27: 2 [IU] via SUBCUTANEOUS

## 2013-03-27 MED ORDER — ALBUTEROL SULFATE (5 MG/ML) 0.5% IN NEBU
2.5000 mg | INHALATION_SOLUTION | RESPIRATORY_TRACT | Status: DC
  Administered 2013-03-27 – 2013-03-30 (×13): 2.5 mg via RESPIRATORY_TRACT
  Filled 2013-03-27 (×17): qty 0.5

## 2013-03-27 MED ORDER — INSULIN GLARGINE 100 UNIT/ML ~~LOC~~ SOLN
12.0000 [IU] | Freq: Two times a day (BID) | SUBCUTANEOUS | Status: DC
  Administered 2013-03-27 – 2013-03-28 (×3): 12 [IU] via SUBCUTANEOUS
  Filled 2013-03-27 (×5): qty 0.12

## 2013-03-27 NOTE — Progress Notes (Signed)
[facility] PROGRESS NOTE  [name] [name] AVW:[id number] DOB: 06/12/1940 DOA: 03/25/2013 PCP: Remus Loffler, PA-C    Code Status: Full code Family Communication: Family not available Disposition Plan: Discharge to home when clinically appropriate.   Consultants:  None  Procedures:  None  Antibiotics:  Vancomycin 03/26/2013  Zosyn 03/26/2013  HPI/Subjective: The patient says he is breathing better. He still has a rattly cough. He is less short of breath overall.  Objective: Filed Vitals:   03/27/13 0435  BP: 116/63  Pulse: 68  Temp: 98 F (36.7 C)  Resp: 20   Oxygen saturation 96% on 2 L of oxygen.   Intake/Output Summary (Last 24 hours) at 03/27/13 1327 Last data filed at 03/27/13 0500  Gross per 24 hour  Intake   1380 ml  Output      0 ml  Net   1380 ml   Filed Weights   03/25/13 1451 03/25/13 2224  Weight: 75.751 kg (167 lb) 76.9 kg (169 lb 8.5 oz)    Exam:   General:  Alert 72 year old Caucasian man sitting up in bed, in no acute distress.  Cardiovascular: S1, S2, with no murmurs rubs or gallops.  Respiratory: fine wheezes throughout all lung fields. Breathing is nonlabored.  Abdomen: Positive bowel sounds, soft, nontender, nondistended.  Musculoskeletal/extremities: Pedal pulses palpable. No pedal edema.  Neurologic: He is alert and oriented x3. Cranial nerves II through XII are intact.  Data Reviewed: Basic Metabolic Panel:  Recent Labs Lab 03/25/13 1550 03/26/13 0532 03/27/13 0540  NA 137 136 140  K 3.9 4.8 4.3  CL 95* 99 100  CO2 32 29 31  GLUCOSE 97 208* 233*  BUN 7 11 12   CREATININE 0.79 0.71 0.71  CALCIUM 10.0 9.8 9.9   Liver Function Tests:  Recent Labs Lab 03/25/13 1550  AST 15  ALT 12  ALKPHOS 88  BILITOT 0.4  PROT 7.7  ALBUMIN 3.4*   No results found for this basename: LIPASE, AMYLASE,  in the last 168 hours No results found for this basename: AMMONIA,  in the last 168 hours CBC:  Recent  Labs Lab 03/25/13 1550 03/26/13 0532 03/27/13 0540  WBC 21.2* 17.8* 23.8*  NEUTROABS 15.4*  --   --   HGB 13.2 12.8* 12.3*  HCT 40.6 37.5* 36.6*  MCV 96.2 95.9 94.8  PLT 247 268 283   Cardiac Enzymes: No results found for this basename: CKTOTAL, CKMB, CKMBINDEX, TROPONINI,  in the last 168 hours BNP (last 3 results) No results found for this basename: PROBNP,  in the last 8760 hours CBG:  Recent Labs Lab 03/26/13 1306 03/26/13 1635 03/26/13 2120 03/27/13 1155  GLUCAP 216* 132* 220* 223*    Recent Results (from the past 240 hour(s))  CULTURE, EXPECTORATED SPUTUM-ASSESSMENT     Status: None   Collection Time    03/26/13  9:33 AM      Result Value Range Status   Specimen Description SPU   Final   Special Requests NONE   Final   Sputum evaluation     Final   Value: THIS SPECIMEN IS ACCEPTABLE. RESPIRATORY CULTURE REPORT TO FOLLOW.   Report Status 03/26/2013 FINAL   Final  CULTURE, RESPIRATORY (NON-EXPECTORATED)     Status: None   Collection Time    03/26/13  9:33 AM      Result Value Range Status   Specimen Description SPU   Final   Special Requests NONE   Final   Gram Stain  Final   Value: ABUNDANT WBC PRESENT,BOTH PMN AND MONONUCLEAR     RARE SQUAMOUS EPITHELIAL CELLS PRESENT     MODERATE GRAM POSITIVE COCCI IN PAIRS     RARE GRAM POSITIVE RODS     Performed at [facility]   Culture PENDING   Incomplete   Report Status PENDING   Incomplete     Studies: Dg Chest 2 View  03/25/2013   CLINICAL DATA:  Cough, shortness of breath, and fever.  EXAM: CHEST  2 VIEW  COMPARISON:  02/16/2013  FINDINGS: The cardiomediastinal silhouette is within normal limits. The patient has taken a slightly greater inspiration. Right lower lobe airspace opacity on the prior study has nearly completely resolved. Diffuse interstitial opacity throughout both lungs is similar to the 10/18/2012 exam and compatible with chronic underlying lung disease. There is no evidence of new  segmental airspace consolidation, edema, or pleural effusion. No acute osseous abnormality is seen.  IMPRESSION: Interval clearing right lower lobe pneumonia. Chronic interstitial changes without definite evidence of new airspace disease.   Electronically Signed   By: Sebastian Ache   On: 03/25/2013 16:12    Scheduled Meds: . albuterol  2.5 mg Nebulization Q6H  . aspirin EC  81 mg Oral Daily  . benzonatate  100 mg Oral TID  . budesonide-formoterol  2 puff Inhalation BID  . dextromethorphan-guaiFENesin  1 tablet Oral BID  . insulin aspart  0-5 Units Subcutaneous QHS  . insulin aspart  0-9 Units Subcutaneous TID WC  . ipratropium  0.5 mg Nebulization Q6H  . methylPREDNISolone (SOLU-MEDROL) injection  60 mg Intravenous Q6H  . pantoprazole  40 mg Oral Daily  . piperacillin-tazobactam (ZOSYN)  IV  3.375 g Intravenous Q8H  . simvastatin  40 mg Oral q1800  . sodium chloride  3 mL Intravenous Q12H  . vancomycin  1,000 mg Intravenous Q12H   Continuous Infusions:   Assessment:   Principal Problem:   Acute and chronic respiratory failure (acute-on-chronic) Active Problems:   C O P D   COPD exacerbation   Anxiety   HYPERTENSION   Hyperlipidemia   Chronic back pain   Hyperglycemia, drug-induced    1. Acute on chronic respiratory failure with hypoxia, secondary to COPD with exacerbation. He is currently stable without much improvement compared to yesterday. We'll continue Solu-Medrol, Zosyn, vancomycin (given for recent hospitalization and recent pneumonia), bronchodilators, and oxygen. Continue Symbicort. Will increase the frequency of albuterol/Atrovent to every 4 hours. Sputum culture pending.  Cough. Have already ordered Mucinex and Tessalon Perles.  Hyperglycemia, steroid induced. Sliding scale NovoLog started yesterday. Will increase the scale to moderate and add Lantus twice a day.  Leukocytosis, secondary to steroid therapy in part.  Hypertension. Currently controlled.  Chronic  anxiety. Will continue when necessary Xanax.  Plan:  1. As above. We'll increase the frequency of albuterol/Atrovent every 4 hours.  Time spent: 25 minutes.    [facility]  [facility] Pager [phone]. If 7PM-7AM, please contact night-coverage at [email or web address], password [facility] 03/27/2013, 1:27 PM  LOS: 2 days

## 2013-03-28 DIAGNOSIS — I10 Essential (primary) hypertension: Secondary | ICD-10-CM

## 2013-03-28 LAB — GLUCOSE, CAPILLARY
Glucose-Capillary: 163 mg/dL — ABNORMAL HIGH (ref 70–99)
Glucose-Capillary: 211 mg/dL — ABNORMAL HIGH (ref 70–99)
Glucose-Capillary: 188 mg/dL — ABNORMAL HIGH (ref 70–99)
Glucose-Capillary: 161 mg/dL — ABNORMAL HIGH (ref 70–99)

## 2013-03-28 MED ORDER — INSULIN GLARGINE 100 UNIT/ML ~~LOC~~ SOLN
10.0000 [IU] | Freq: Two times a day (BID) | SUBCUTANEOUS | Status: DC
  Administered 2013-03-28 – 2013-03-29 (×2): 10 [IU] via SUBCUTANEOUS
  Filled 2013-03-28 (×4): qty 0.1

## 2013-03-28 MED ORDER — INSULIN ASPART 100 UNIT/ML ~~LOC~~ SOLN
0.0000 [IU] | Freq: Three times a day (TID) | SUBCUTANEOUS | Status: DC
  Administered 2013-03-28: 4 [IU] via SUBCUTANEOUS
  Administered 2013-03-29: 11 [IU] via SUBCUTANEOUS
  Administered 2013-03-29 (×2): 4 [IU] via SUBCUTANEOUS
  Administered 2013-03-30: 7 [IU] via SUBCUTANEOUS
  Administered 2013-03-30: 3 [IU] via SUBCUTANEOUS

## 2013-03-28 MED ORDER — INSULIN ASPART 100 UNIT/ML ~~LOC~~ SOLN: 0.0000 [IU] | Freq: Every day | SUBCUTANEOUS | Status: DC

## 2013-03-28 MED ORDER — VANCOMYCIN HCL 10 G IV SOLR
1250.0000 mg | Freq: Two times a day (BID) | INTRAVENOUS | Status: DC
  Administered 2013-03-28 – 2013-03-30 (×4): 1250 mg via INTRAVENOUS
  Filled 2013-03-28 (×6): qty 1250

## 2013-03-28 NOTE — Progress Notes (Signed)
[facility] PROGRESS NOTE  [name] [name] WNU:[id number] DOB: 1940-07-05 DOA: 03/25/2013 PCP: Remus Loffler, PA-C    Code Status: Full code Family Communication: Family not available Disposition Plan: Discharge to home when clinically appropriate.   Consultants:  None  Procedures:  None  Antibiotics:  Vancomycin 03/26/2013  Zosyn 03/26/2013  HPI/Subjective: The patient says he is breathing about the same. He had 2 loose bowel movements this morning and 3 loose bowel movements yesterday. He denies abdominal pain. He denies black tarry stools or bright red blood in the stools. He denies nausea vomiting.  Objective: Filed Vitals:   03/28/13 0425  BP: 118/83  Pulse: 57  Temp: 97.5 F (36.4 C)  Resp: 20   Oxygen saturation 93% on 2 L of oxygen.   Intake/Output Summary (Last 24 hours) at 03/28/13 1210 Last data filed at 03/27/13 1930  Gross per 24 hour  Intake    240 ml  Output      0 ml  Net    240 ml   Filed Weights   03/25/13 1451 03/25/13 2224  Weight: 75.751 kg (167 lb) 76.9 kg (169 lb 8.5 oz)    Exam:   General:  Alert 72 year old Caucasian man sitting up in bed, in no acute distress.  Cardiovascular: S1, S2, with no murmurs rubs or gallops.  Respiratory: Scattered fine wheezes bilaterally. Breathing is nonlabored.  Abdomen: Positive bowel sounds, soft, nontender, nondistended.  Musculoskeletal/extremities: Pedal pulses palpable. No pedal edema.  Neurologic: He is alert and oriented x3. Cranial nerves II through XII are intact.  Data Reviewed: Basic Metabolic Panel:  Recent Labs Lab 03/25/13 1550 03/26/13 0532 03/27/13 0540  NA 137 136 140  K 3.9 4.8 4.3  CL 95* 99 100  CO2 32 29 31  GLUCOSE 97 208* 233*  BUN 7 11 12   CREATININE 0.79 0.71 0.71  CALCIUM 10.0 9.8 9.9   Liver Function Tests:  Recent Labs Lab 03/25/13 1550  AST 15  ALT 12  ALKPHOS 88  BILITOT 0.4  PROT 7.7  ALBUMIN 3.4*   No results found for this  basename: LIPASE, AMYLASE,  in the last 168 hours No results found for this basename: AMMONIA,  in the last 168 hours CBC:  Recent Labs Lab 03/25/13 1550 03/26/13 0532 03/27/13 0540  WBC 21.2* 17.8* 23.8*  NEUTROABS 15.4*  --   --   HGB 13.2 12.8* 12.3*  HCT 40.6 37.5* 36.6*  MCV 96.2 95.9 94.8  PLT 247 268 283   Cardiac Enzymes: No results found for this basename: CKTOTAL, CKMB, CKMBINDEX, TROPONINI,  in the last 168 hours BNP (last 3 results) No results found for this basename: PROBNP,  in the last 8760 hours CBG:  Recent Labs Lab 03/26/13 2120 03/27/13 1155 03/27/13 1629 03/27/13 2104 03/28/13 0752  GLUCAP 220* 223* 148* 239* 163*    Recent Results (from the past 240 hour(s))  CULTURE, EXPECTORATED SPUTUM-ASSESSMENT     Status: None   Collection Time    03/26/13  9:33 AM      Result Value Range Status   Specimen Description SPU   Final   Special Requests NONE   Final   Sputum evaluation     Final   Value: THIS SPECIMEN IS ACCEPTABLE. RESPIRATORY CULTURE REPORT TO FOLLOW.   Report Status 03/26/2013 FINAL   Final  CULTURE, RESPIRATORY (NON-EXPECTORATED)     Status: None   Collection Time    03/26/13  9:33 AM  Result Value Range Status   Specimen Description SPU   Final   Special Requests NONE   Final   Gram Stain     Final   Value: ABUNDANT WBC PRESENT,BOTH PMN AND MONONUCLEAR     RARE SQUAMOUS EPITHELIAL CELLS PRESENT     MODERATE GRAM POSITIVE COCCI IN PAIRS     RARE GRAM POSITIVE RODS     Performed at [facility]   Culture     Final   Value: Culture reincubated for better growth     Performed at [facility]   Report Status PENDING   Incomplete     Studies: No results found.  Scheduled Meds: . albuterol  2.5 mg Nebulization Q4H  . aspirin EC  81 mg Oral Daily  . benzonatate  100 mg Oral TID  . budesonide-formoterol  2 puff Inhalation BID  . dextromethorphan-guaiFENesin  1 tablet Oral BID  . insulin aspart  0-15 Units  Subcutaneous TID WC  . insulin aspart  0-5 Units Subcutaneous QHS  . insulin glargine  12 Units Subcutaneous BID  . ipratropium  0.5 mg Nebulization Q4H  . methylPREDNISolone (SOLU-MEDROL) injection  60 mg Intravenous Q6H  . pantoprazole  40 mg Oral Daily  . piperacillin-tazobactam (ZOSYN)  IV  3.375 g Intravenous Q8H  . simvastatin  40 mg Oral q1800  . sodium chloride  3 mL Intravenous Q12H  . vancomycin  1,250 mg Intravenous Q12H   Continuous Infusions:   Assessment:   Principal Problem:   Acute and chronic respiratory failure (acute-on-chronic) Active Problems:   C O P D   COPD exacerbation   Anxiety   HYPERTENSION   Hyperlipidemia   Chronic back pain   Hyperglycemia, drug-induced    1. Acute on chronic respiratory failure with hypoxia, secondary to COPD with exacerbation. He is currently stable without much improvement compared to yesterday. We'll continue Solu-Medrol, Zosyn, vancomycin (given for recent hospitalization and recent pneumonia), bronchodilators, and oxygen. Continue Symbicort.  Frequency of albuterol/Atrovent increased to every 4 hours. Sputum culture pending.  Cough. Continue Mucinex and Tessalon Perles.  Hyperglycemia, steroid induced. Sliding scale NovoLog started. Will increase the scale to resistant and continue Lantus twice a day.  Leukocytosis, secondary to steroid therapy in part.  Hypertension. Currently controlled.  Chronic anxiety. Will continue when necessary Xanax.  Loose stools. Will order a C. difficile PCR.  Plan:  1. We'll order a C. difficile PCR. Consider narrowing antibiotic therapy tomorrow.  2. Will increase sliding scale NovoLog to resistive scale..  Time spent: 25 minutes.    [facility]  [facility] Pager [phone]. If 7PM-7AM, please contact night-coverage at [email or web address], password [facility] 03/28/2013, 12:10 PM  LOS: 3 days

## 2013-03-28 NOTE — Progress Notes (Signed)
ANTIBIOTIC CONSULT NOTE  Pharmacy Consult for Vancomycin & Zosyn Indication: pneumonia  Allergies  Allergen Reactions  . Iohexol Hives     Code: HIVES, Desc: PT STATES HE BROKE OUT IN HIVES AND RASH AFTER CT NECK EARLY SEPT 2011; NO RESP PROBLEMS; NEEDS PRE-MEDS; MKS, Onset Date: [id number]     Patient Measurements: Height: 5\' 5"  (165.1 cm) Weight: 169 lb 8.5 oz (76.9 kg) IBW/kg (Calculated) : 61.5  Vital Signs: Temp: 97.5 F (36.4 C) (12/14 0425) Temp src: Oral (12/14 0425) BP: 118/83 mmHg (12/14 0425) Pulse Rate: 57 (12/14 0425) Intake/Output from previous day: 12/13 0701 - 12/14 0700 In: 720 [P.O.:720] Out: -  Intake/Output from this shift:    Labs:  Recent Labs  03/25/13 1550 03/26/13 0532 03/27/13 0540  WBC 21.2* 17.8* 23.8*  HGB 13.2 12.8* 12.3*  PLT 247 268 283  CREATININE 0.79 0.71 0.71   Estimated Creatinine Clearance: 81.1 ml/min (by C-G formula based on Cr of 0.71).  Recent Labs  03/27/13 1912  VANCOTROUGH 10.7     Microbiology: Recent Results (from the past 720 hour(s))  CULTURE, EXPECTORATED SPUTUM-ASSESSMENT     Status: None   Collection Time    03/26/13  9:33 AM      Result Value Range Status   Specimen Description SPU   Final   Special Requests NONE   Final   Sputum evaluation     Final   Value: THIS SPECIMEN IS ACCEPTABLE. RESPIRATORY CULTURE REPORT TO FOLLOW.   Report Status 03/26/2013 FINAL   Final  CULTURE, RESPIRATORY (NON-EXPECTORATED)     Status: None   Collection Time    03/26/13  9:33 AM      Result Value Range Status   Specimen Description SPU   Final   Special Requests NONE   Final   Gram Stain     Final   Value: ABUNDANT WBC PRESENT,BOTH PMN AND MONONUCLEAR     RARE SQUAMOUS EPITHELIAL CELLS PRESENT     MODERATE GRAM POSITIVE COCCI IN PAIRS     RARE GRAM POSITIVE RODS     Performed at [facility]   Culture     Final   Value: Culture reincubated for better growth     Performed at [facility]   Report Status PENDING   Incomplete    Medical History: Past Medical History  Diagnosis Date  . COPD (chronic obstructive pulmonary disease)   . Asthma   . Tobacco abuse 09/29/2011  . Hypercholesteremia   . GERD (gastroesophageal reflux disease)   . Headache(784.0)   . BPH (benign prostatic hyperplasia) 10/01/2011    Per cystoscopy  . 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  . Pulmonary nodule 09/2011    Stable appearance  . Chronic back pain   . Chronic anxiety   . Hyperlipidemia 03/13/2012  . Anxiety 03/13/2012    Medications:  Scheduled:  . albuterol  2.5 mg Nebulization Q4H  . aspirin EC  81 mg Oral Daily  . benzonatate  100 mg Oral TID  . budesonide-formoterol  2 puff Inhalation BID  . dextromethorphan-guaiFENesin  1 tablet Oral BID  . insulin aspart  0-15 Units Subcutaneous TID WC  . insulin aspart  0-5 Units Subcutaneous QHS  . insulin glargine  12 Units Subcutaneous BID  . ipratropium  0.5 mg Nebulization Q4H  . methylPREDNISolone (SOLU-MEDROL) injection  60 mg Intravenous Q6H  . pantoprazole  40 mg Oral Daily  . piperacillin-tazobactam (ZOSYN)  IV  3.375 g Intravenous Q8H  . simvastatin  40 mg Oral q1800  . sodium chloride  3 mL Intravenous Q12H  . vancomycin  1,000 mg Intravenous Q12H   Assessment: 72 yo M with RLL PNA per CXR.  He was admitted at [facility] ~ 1 month ago with PNA.   Continued elevated WBC, afebrile.   Renal function is at patient's baseline. Trough below goal.  Vancomycin 12/11>> Zosyn 12/11>>  Goal of Therapy:  Vancomycin trough level 15-20 mcg/ml  Plan:  Continue Zosyn 3.375gm IV Q8h to be infused over 4 hrs. Increase Vancomycin to 1250mg  IV q 12 h Repeat Vancomycin trough at steady state Monitor renal function and cx data   Mady Gemma 03/28/2013,10:51 AM

## 2013-03-29 LAB — BASIC METABOLIC PANEL
Chloride: 98 mEq/L (ref 96–112)
Creatinine, Ser: 0.81 mg/dL (ref 0.50–1.35)
GFR calc Af Amer: >90 mL/min (ref >90)
GFR calc non Af Amer: 87 mL/min — ABNORMAL LOW (ref >90)
Potassium: 3.4 mEq/L — ABNORMAL LOW (ref 3.5–5.1)

## 2013-03-29 LAB — CULTURE, RESPIRATORY W GRAM STAIN: Culture: NORMAL

## 2013-03-29 LAB — URINALYSIS, ROUTINE W REFLEX MICROSCOPIC
Bilirubin Urine: NEGATIVE
Glucose, UA: NEGATIVE mg/dL
Ketones, ur: NEGATIVE mg/dL
Leukocytes, UA: NEGATIVE
Nitrite: NEGATIVE
Protein, ur: NEGATIVE mg/dL
Specific Gravity, Urine: 1.02 (ref 1.005–1.030)
Urobilinogen, UA: 0.2 mg/dL (ref 0.0–1.0)

## 2013-03-29 LAB — CBC
HCT: 37.1 % — ABNORMAL LOW (ref 39.0–52.0)
Hemoglobin: 12.1 g/dL — ABNORMAL LOW (ref 13.0–17.0)
MCH: 30.9 pg (ref 26.0–34.0)
MCHC: 32.6 g/dL (ref 30.0–36.0)
Platelets: 270 10*3/uL (ref 150–400)
RDW: 14.4 % (ref 11.5–15.5)
WBC: 22.4 10*3/uL — ABNORMAL HIGH (ref 4.0–10.5)

## 2013-03-29 LAB — GLUCOSE, CAPILLARY
Glucose-Capillary: 163 mg/dL — ABNORMAL HIGH (ref 70–99)
Glucose-Capillary: 189 mg/dL — ABNORMAL HIGH (ref 70–99)
Glucose-Capillary: 281 mg/dL — ABNORMAL HIGH (ref 70–99)
Glucose-Capillary: 158 mg/dL — ABNORMAL HIGH (ref 70–99)

## 2013-03-29 LAB — CULTURE, RESPIRATORY

## 2013-03-29 MED ORDER — INSULIN GLARGINE 100 UNIT/ML ~~LOC~~ SOLN
17.0000 [IU] | Freq: Two times a day (BID) | SUBCUTANEOUS | Status: DC
  Administered 2013-03-29 – 2013-03-30 (×2): 17 [IU] via SUBCUTANEOUS
  Filled 2013-03-29 (×4): qty 0.17

## 2013-03-29 MED ORDER — POTASSIUM CHLORIDE CRYS ER 20 MEQ PO TBCR
20.0000 meq | EXTENDED_RELEASE_TABLET | Freq: Two times a day (BID) | ORAL | Status: AC
  Administered 2013-03-29 (×2): 20 meq via ORAL
  Filled 2013-03-29 (×2): qty 1

## 2013-03-29 NOTE — Progress Notes (Signed)
Inpatient Diabetes Program Recommendations  AACE/ADA: New Consensus Statement on Inpatient Glycemic Control (2013)  Target Ranges:  Prepandial:   less than 140 mg/dL      Peak postprandial:   less than 180 mg/dL (1-2 hours)      Critically ill patients:  140 - 180 mg/dL  Results for DELQUAN, POUCHER (MRN [id number]) as of 03/29/2013 08:05  Ref. Range 03/28/2013 07:52 03/28/2013 12:00 03/28/2013 17:10 03/28/2013 21:01 03/29/2013 07:18  Glucose-Capillary Latest Range: 70-99 mg/dL 782 (H) 956 (H) 213 (H) 161 (H) 189 (H)    Inpatient Diabetes Program Recommendations Insulin - Basal: If steroids are not decreased, please consider increasing Lantus to 11 units BID. Diet: May want to consider adding Carb Modified to current heart healthy diet.  Note: Patient does not have a documented history of diabetes and A1C was 5.7% on 03/26/13.  Noted Lantus was decreased from 12 units BID to 10 units BID yesterday and Novolog correction was increased to resistant correction scale yesterday.  Fasting glucose this morning noted to be 189 mg/dl.  Please consider increasing Lantus to 11 units BID (if steroids are not decreased today) and may want to consider adding Carb Modified to current heart healthy diet.  Will continue to follow.  Thanks, Orlando Penner, RN, MSN, CCRN Diabetes Coordinator Inpatient Diabetes Program [phone] (Team Pager) [phone] ([facility] office) [phone] [facility] office)

## 2013-03-29 NOTE — Progress Notes (Signed)
03/29/13 0904 Patient denies loose stools this morning. c-diff PCR sent to lab per night shift RN, result pending. Educated patient on enteric precautions until c-diff PCR resulted. Pt stated "i'm not contagious", but understands precautions. Earnstine Regal, RN

## 2013-03-29 NOTE — Progress Notes (Signed)
[facility] PROGRESS NOTE  BECKY BERBERIAN ZOX:[id number] DOB: Aug 25, 1940 DOA: 03/25/2013 PCP: Remus Loffler, PA-C    Code Status: Full code Family Communication: Family not available Disposition Plan: Discharge to home when clinically appropriate.   Consultants:  None  Procedures:  None  Antibiotics:  Vancomycin 03/26/2013  Zosyn 03/26/2013  HPI/Subjective: The patient says he is breathing better this morning. He has no more loose stools. He says that his urine is malodorous, but he denies pain with urination.  Objective: Filed Vitals:   03/29/13 0406  BP: 137/67  Pulse: 78  Temp: 97.9 F (36.6 C)  Resp: 20   Oxygen saturation 89-91 % on 2 L of oxygen.   Intake/Output Summary (Last 24 hours) at 03/29/13 1239 Last data filed at 03/29/13 1204  Gross per 24 hour  Intake   1603 ml  Output      0 ml  Net   1603 ml   Filed Weights   03/25/13 1451 03/25/13 2224 03/29/13 0406  Weight: 75.751 kg (167 lb) 76.9 kg (169 lb 8.5 oz) 76.9 kg (169 lb 8.5 oz)    Exam:   General:  Alert 72 year old Caucasian man sitting up in bed, in no acute distress.  Cardiovascular: S1, S2, with no murmurs rubs or gallops.  Respiratory: Fewer scattered fine wheezes compared to yesterday. Breathing is nonlabored.  Abdomen: Positive bowel sounds, soft, nontender, nondistended.  Musculoskeletal/extremities: Pedal pulses palpable. No pedal edema.  Neurologic: He is alert and oriented x3. Cranial nerves II through XII are intact.  Data Reviewed: Basic Metabolic Panel:  Recent Labs Lab 03/25/13 1550 03/26/13 0532 03/27/13 0540 03/29/13 0534  NA 137 136 140 139  K 3.9 4.8 4.3 3.4*  CL 95* 99 100 98  CO2 32 29 31 33*  GLUCOSE 97 208* 233* 217*  BUN 7 11 12 19   CREATININE 0.79 0.71 0.71 0.81  CALCIUM 10.0 9.8 9.9 9.7   Liver Function Tests:  Recent Labs Lab 03/25/13 1550  AST 15  ALT 12  ALKPHOS 88  BILITOT 0.4  PROT 7.7  ALBUMIN 3.4*   No results found  for this basename: LIPASE, AMYLASE,  in the last 168 hours No results found for this basename: AMMONIA,  in the last 168 hours CBC:  Recent Labs Lab 03/25/13 1550 03/26/13 0532 03/27/13 0540 03/29/13 0534  WBC 21.2* 17.8* 23.8* 22.4*  NEUTROABS 15.4*  --   --   --   HGB 13.2 12.8* 12.3* 12.1*  HCT 40.6 37.5* 36.6* 37.1*  MCV 96.2 95.9 94.8 94.6  PLT 247 268 283 270   Cardiac Enzymes: No results found for this basename: CKTOTAL, CKMB, CKMBINDEX, TROPONINI,  in the last 168 hours BNP (last 3 results) No results found for this basename: PROBNP,  in the last 8760 hours CBG:  Recent Labs Lab 03/28/13 1200 03/28/13 1710 03/28/13 2101 03/29/13 0718 03/29/13 1118  GLUCAP 211* 188* 161* 189* 281*    Recent Results (from the past 240 hour(s))  CULTURE, EXPECTORATED SPUTUM-ASSESSMENT     Status: None   Collection Time    03/26/13  9:33 AM      Result Value Range Status   Specimen Description SPU   Final   Special Requests NONE   Final   Sputum evaluation     Final   Value: THIS SPECIMEN IS ACCEPTABLE. RESPIRATORY CULTURE REPORT TO FOLLOW.   Report Status 03/26/2013 FINAL   Final  CULTURE, RESPIRATORY (NON-EXPECTORATED)     Status: None  Collection Time    03/26/13  9:33 AM      Result Value Range Status   Specimen Description SPU   Final   Special Requests NONE   Final   Gram Stain     Final   Value: ABUNDANT WBC PRESENT,BOTH PMN AND MONONUCLEAR     RARE SQUAMOUS EPITHELIAL CELLS PRESENT     MODERATE GRAM POSITIVE COCCI IN PAIRS     RARE GRAM POSITIVE RODS     Performed at [facility]   Culture     Final   Value: NORMAL OROPHARYNGEAL FLORA     Performed at [facility]   Report Status 03/29/2013 FINAL   Final  CLOSTRIDIUM DIFFICILE BY PCR     Status: None   Collection Time    03/29/13  5:55 AM      Result Value Range Status   C difficile by pcr NEGATIVE  NEGATIVE Final     Studies: No results found.  Scheduled Meds: . albuterol  2.5 mg  Nebulization Q4H  . aspirin EC  81 mg Oral Daily  . benzonatate  100 mg Oral TID  . budesonide-formoterol  2 puff Inhalation BID  . dextromethorphan-guaiFENesin  1 tablet Oral BID  . insulin aspart  0-20 Units Subcutaneous TID WC  . insulin aspart  0-5 Units Subcutaneous QHS  . insulin glargine  10 Units Subcutaneous BID  . ipratropium  0.5 mg Nebulization Q4H  . methylPREDNISolone (SOLU-MEDROL) injection  60 mg Intravenous Q6H  . pantoprazole  40 mg Oral Daily  . piperacillin-tazobactam (ZOSYN)  IV  3.375 g Intravenous Q8H  . potassium chloride  20 mEq Oral BID  . simvastatin  40 mg Oral q1800  . sodium chloride  3 mL Intravenous Q12H  . vancomycin  1,250 mg Intravenous Q12H   Continuous Infusions:   Assessment:   Principal Problem:   Acute and chronic respiratory failure (acute-on-chronic) Active Problems:   C O P D   COPD exacerbation   Anxiety   HYPERTENSION   Hyperlipidemia   Chronic back pain   Hyperglycemia, drug-induced    1. Acute on chronic respiratory failure with hypoxia, secondary to COPD with exacerbation. He is improving clinically and symptomatically. We'll continue Solu-Medrol, Zosyn, vancomycin (given for recent hospitalization and recent pneumonia), bronchodilators, and oxygen. Continue Symbicort.  Frequency of albuterol/Atrovent increased to every 4 hours. Sputum culture revealed normal flora.  Cough. Continue Mucinex and Tessalon Perles.  Hyperglycemia, steroid induced. Sliding scale NovoLog started increased to the resistive scale and Lantus was increased. Will adjust accordingly.  Leukocytosis, secondary to steroid therapy in part.  Hypertension. Currently controlled.  Chronic anxiety. Will continue when necessary Xanax.  Loose stools.  C. difficile PCR was negative.  Plan:  1.Will increase Lantus to 17 units twice a day. 2. Replete/supplement potassium chloride. 3. Check a urinalysis to rule out infection.  Time spent: 25  minutes.    [facility]  [facility] Pager [phone]. If 7PM-7AM, please contact night-coverage at [email or web address], password [facility] 03/29/2013, 12:39 PM  LOS: 4 days

## 2013-03-30 LAB — BASIC METABOLIC PANEL
BUN: 19 mg/dL (ref 6–23)
Chloride: 97 mEq/L (ref 96–112)
Creatinine, Ser: 0.81 mg/dL (ref 0.50–1.35)
GFR calc Af Amer: >90 mL/min (ref >90)
GFR calc non Af Amer: 87 mL/min — ABNORMAL LOW (ref >90)
Glucose, Bld: 188 mg/dL — ABNORMAL HIGH (ref 70–99)
Potassium: 3.2 mEq/L — ABNORMAL LOW (ref 3.5–5.1)

## 2013-03-30 LAB — GLUCOSE, CAPILLARY
Glucose-Capillary: 125 mg/dL — ABNORMAL HIGH (ref 70–99)
Glucose-Capillary: 205 mg/dL — ABNORMAL HIGH (ref 70–99)

## 2013-03-30 LAB — CBC
HCT: 39.1 % (ref 39.0–52.0)
Hemoglobin: 13.1 g/dL (ref 13.0–17.0)
MCH: 31.3 pg (ref 26.0–34.0)
MCHC: 33.5 g/dL (ref 30.0–36.0)
MCV: 93.3 fL (ref 78.0–100.0)
Platelets: 277 10*3/uL (ref 150–400)
RDW: 14 % (ref 11.5–15.5)
WBC: 22.4 10*3/uL — ABNORMAL HIGH (ref 4.0–10.5)

## 2013-03-30 MED ORDER — BENZONATATE 100 MG PO CAPS: 100.0000 mg | ORAL_CAPSULE | Freq: Three times a day (TID) | ORAL | Status: DC

## 2013-03-30 MED ORDER — PREDNISONE 10 MG PO TABS: ORAL_TABLET | ORAL | Status: DC

## 2013-03-30 MED ORDER — POTASSIUM CHLORIDE CRYS ER 20 MEQ PO TBCR
40.0000 meq | EXTENDED_RELEASE_TABLET | Freq: Two times a day (BID) | ORAL | Status: DC
  Administered 2013-03-30: 40 meq via ORAL
  Filled 2013-03-30: qty 2

## 2013-03-30 MED ORDER — IPRATROPIUM-ALBUTEROL 18-103 MCG/ACT IN AERO: 2.0000 | INHALATION_SPRAY | Freq: Four times a day (QID) | RESPIRATORY_TRACT | Status: DC

## 2013-03-30 MED ORDER — LEVOFLOXACIN 750 MG PO TABS: 750.0000 mg | ORAL_TABLET | Freq: Every day | ORAL | Status: DC

## 2013-03-30 MED ORDER — PREDNISONE 20 MG PO TABS: 60.0000 mg | ORAL_TABLET | Freq: Two times a day (BID) | ORAL | Status: DC

## 2013-03-30 MED ORDER — POTASSIUM CHLORIDE CRYS ER 20 MEQ PO TBCR: 20.0000 meq | EXTENDED_RELEASE_TABLET | Freq: Two times a day (BID) | ORAL | Status: DC

## 2013-03-30 MED ORDER — BUDESONIDE-FORMOTEROL FUMARATE 160-4.5 MCG/ACT IN AERO: 2.0000 | INHALATION_SPRAY | Freq: Two times a day (BID) | RESPIRATORY_TRACT | Status: DC

## 2013-03-30 NOTE — Discharge Summary (Signed)
Physician Discharge Summary  [name] [name] JYN:[id number] DOB: 12-Jan-1941 DOA: 03/25/2013  PCP: Remus Loffler, PA-C  Admit date: 03/25/2013 Discharge date: 03/30/2013  Time spent: Greater than 30 minutes  Recommendations for Outpatient Follow-up:  1. He will need his WBC reassessed at the hospital follow up.  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   Discharge Condition: Improved  Diet recommendation: Heart healthy  Filed Weights   03/25/13 2224 03/29/13 0406 03/30/13 0519  Weight: 76.9 kg (169 lb 8.5 oz) 76.9 kg (169 lb 8.5 oz) 76.4 kg (168 lb 6.9 oz)    History of present illness:  The patient is a 72 yo male with h/o chronic respiratory failure with 02 dependent COPD requiring 3-4 liters of 02, who presented with worsening sob, low grade fever, and productive coughing. He is frequently treated with steroids and abx in the outpatient setting and during hospitalizations. No n/v/d. No dysuria. No rashes. No nasal congestion. His CXR revealed chronic changes and clearing of RLL pneumonia. He was admitted for further treatment.   Hospital Course:   1. Acute on chronic respiratory failure with hypoxia, secondary to COPD with exacerbation. The patient was started on Solu-Medrol, Zosyn, vancomycin (given for recent hospitalization and recent pneumonia), bronchodilators, and oxygen. Symbicort was continued. His cough was treated with Tessalon Perles and Mucinex. His sputum culture revealed normal flora. He improved clinically and symptomatically He was discharged on Levaquin, prednisone taper, 02, and chronic bronchodilators.   Hyperglycemia, steroid induced.  Sliding scale NovoLog and Lantus were given and adjusted. His HbA1c was 5.7.    Leukocytosis. His WBC was 21.2 on admission after receiving IV Solumedrol. It increased to 22.4, but he remained afebrile. The leukocytosis was thought to be secondary to steroid therapy in part.   Loose stools. The patient developed loose stools for 1 day. C. difficile PCR was negative.   Procedures:  None  Consultations:  None  Discharge Exam: Filed Vitals:   03/30/13 0519  BP: 146/72  Pulse: 63  Temp: 98 F (36.7 C)  Resp: 20   General: Alert 72 year old Caucasian man sitting up in bed, in no acute distress.  Cardiovascular: S1, S2, with no murmurs rubs or gallops.  Respiratory: Rare scattered fine wheezes. Breathing is nonlabored.  Abdomen: Positive bowel sounds, soft, nontender, nondistended.  Musculoskeletal/extremities: Pedal pulses palpable. No pedal edema.  Neurologic: He is alert and oriented x3. Cranial nerves II through XII are intact.   Discharge Instructions  Discharge Orders   Future Orders Complete By Expires   Diet - low sodium heart healthy  As directed    Discharge instructions  As directed    Comments:     While you're on prednisone, eat a low sugar diet. Avoid drinking sugary beverages.   Increase activity slowly  As directed        Medication List         albuterol (2.5 MG/3ML) 0.083% nebulizer solution  Commonly known as:  PROVENTIL  Take 2.5 mg by nebulization every 4 (four) hours as needed. For shortness of breath     albuterol-ipratropium 18-103 MCG/ACT inhaler  Commonly known as:  COMBIVENT  Inhale 2 puffs into the lungs 4 (four) times daily.     ALPRAZolam 1 MG tablet  Commonly  known as:  XANAX  Take 1 tablet (1 mg total) by mouth 2 (two) times daily as needed. anxiety     aspirin EC 81 MG tablet  Take 81 mg by mouth daily.     benzonatate 100 MG capsule  Commonly known as:  TESSALON  Take 1 capsule (100 mg total) by mouth 3 (three) times daily.     budesonide-formoterol 160-4.5 MCG/ACT inhaler  Commonly known as:   SYMBICORT  Inhale 2 puffs into the lungs 2 (two) times daily.     CENTRUM tablet  Take 1 tablet by mouth daily.     FISH OIL PO  Take 1 capsule by mouth daily.     Garlic 1000 MG Caps  Take 1 capsule by mouth daily.     levofloxacin 750 MG tablet  Commonly known as:  LEVAQUIN  Take 1 tablet (750 mg total) by mouth daily. Antibiotic. Start taking tomorrow.     NITROSTAT 0.4 MG SL tablet  Generic drug:  nitroGLYCERIN  Place 1 tablet under the tongue as needed for chest pain.     pantoprazole 40 MG tablet  Commonly known as:  PROTONIX  Take 40 mg by mouth daily.     potassium chloride SA 20 MEQ tablet  Commonly known as:  K-DUR,KLOR-CON  Take 1 tablet (20 mEq total) by mouth 2 (two) times daily. Take potassium supplement for 2 more weeks for your low potassium level.     predniSONE 10 MG tablet  Commonly known as:  DELTASONE  Take prednisone taper as follows: Take 6 tablets daily for 2 days in a row starting tomorrow; then take 5 tablets daily for 2 days in a row; then take 4 tablets the next day for one day; then take 3 tablets the next day for one day; then take 2 tablets the next day for one day ; then take 1 tablet the next day for one day; then stop.     simvastatin 40 MG tablet  Commonly known as:  ZOCOR  Take 40 mg by mouth daily.     Vitamin D3 1000 UNITS Caps  Take 1 capsule by mouth daily.       Allergies  Allergen Reactions  . Iohexol Hives     Code: HIVES, Desc: PT STATES HE BROKE OUT IN HIVES AND RASH AFTER CT NECK EARLY SEPT 2011; NO RESP PROBLEMS; NEEDS PRE-MEDS; MKS, Onset Date: [id number]        Follow-up Information   Follow up with Remus Loffler, PA-C On 04/06/2013. (at 2:00 pm)    Specialty:  General Practice   Contact information:   [address] [place] [address] [phone]        The results of significant diagnostics from this hospitalization (including imaging, microbiology, ancillary and laboratory) are listed below for  reference.    Significant Diagnostic Studies: Dg Chest 2 View  03/25/2013   CLINICAL DATA:  Cough, shortness of breath, and fever.  EXAM: CHEST  2 VIEW  COMPARISON:  02/16/2013  FINDINGS: The cardiomediastinal silhouette is within normal limits. The patient has taken a slightly greater inspiration. Right lower lobe airspace opacity on the prior study has nearly completely resolved. Diffuse interstitial opacity throughout both lungs is similar to the 10/18/2012 exam and compatible with chronic underlying lung disease. There is no evidence of new segmental airspace consolidation, edema, or pleural effusion. No acute osseous abnormality is seen.  IMPRESSION: Interval clearing right lower lobe pneumonia. Chronic interstitial changes without definite evidence  of new airspace disease.   Electronically Signed   By: Sebastian Ache   On: 03/25/2013 16:12    Microbiology: Recent Results (from the past 240 hour(s))  CULTURE, EXPECTORATED SPUTUM-ASSESSMENT     Status: None   Collection Time    03/26/13  9:33 AM      Result Value Range Status   Specimen Description SPU   Final   Special Requests NONE   Final   Sputum evaluation     Final   Value: THIS SPECIMEN IS ACCEPTABLE. RESPIRATORY CULTURE REPORT TO FOLLOW.   Report Status 03/26/2013 FINAL   Final  CULTURE, RESPIRATORY (NON-EXPECTORATED)     Status: None   Collection Time    03/26/13  9:33 AM      Result Value Range Status   Specimen Description SPU   Final   Special Requests NONE   Final   Gram Stain     Final   Value: ABUNDANT WBC PRESENT,BOTH PMN AND MONONUCLEAR     RARE SQUAMOUS EPITHELIAL CELLS PRESENT     MODERATE GRAM POSITIVE COCCI IN PAIRS     RARE GRAM POSITIVE RODS     Performed at [facility]   Culture     Final   Value: NORMAL OROPHARYNGEAL FLORA     Performed at [facility]   Report Status 03/29/2013 FINAL   Final  CLOSTRIDIUM DIFFICILE BY PCR     Status: None   Collection Time    03/29/13  5:55 AM       Result Value Range Status   C difficile by pcr NEGATIVE  NEGATIVE Final     Labs: Basic Metabolic Panel:  Recent Labs Lab 03/25/13 1550 03/26/13 0532 03/27/13 0540 03/29/13 0534 03/30/13 0542  NA 137 136 140 139 141  K 3.9 4.8 4.3 3.4* 3.2*  CL 95* 99 100 98 97  CO2 32 29 31 33* 31  GLUCOSE 97 208* 233* 217* 188*  BUN 7 11 12 19 19   CREATININE 0.79 0.71 0.71 0.81 0.81  CALCIUM 10.0 9.8 9.9 9.7 9.8  MG  --   --   --   --  2.3   Liver Function Tests:  Recent Labs Lab 03/25/13 1550  AST 15  ALT 12  ALKPHOS 88  BILITOT 0.4  PROT 7.7  ALBUMIN 3.4*   No results found for this basename: LIPASE, AMYLASE,  in the last 168 hours No results found for this basename: AMMONIA,  in the last 168 hours CBC:  Recent Labs Lab 03/25/13 1550 03/26/13 0532 03/27/13 0540 03/29/13 0534 03/30/13 0542  WBC 21.2* 17.8* 23.8* 22.4* 22.4*  NEUTROABS 15.4*  --   --   --   --   HGB 13.2 12.8* 12.3* 12.1* 13.1  HCT 40.6 37.5* 36.6* 37.1* 39.1  MCV 96.2 95.9 94.8 94.6 93.3  PLT 247 268 283 270 277   Cardiac Enzymes: No results found for this basename: CKTOTAL, CKMB, CKMBINDEX, TROPONINI,  in the last 168 hours BNP: BNP (last 3 results) No results found for this basename: PROBNP,  in the last 8760 hours CBG:  Recent Labs Lab 03/29/13 1118 03/29/13 1558 03/29/13 2137 03/30/13 0733 03/30/13 1105  GLUCAP 281* 158* 189* 125* 205*       Signed:  Rathana Viveros  [facility] 03/30/2013, 7:29 PM

## 2013-03-30 NOTE — Progress Notes (Signed)
IV removed, site WNL.  Pt given d/c instructions and new prescriptions, instructed when to take next dose.  Discussed home care with patient and discussed home medications, patient verbalizes understanding, teachback completed. F/U appointment in place, pt states they will keep appointment. Pt is stable at this time, expecting his ride to get her around 2pm.

## 2013-06-18 ENCOUNTER — Emergency Department (HOSPITAL_COMMUNITY): Payer: Medicare Other

## 2013-06-18 ENCOUNTER — Emergency Department (HOSPITAL_COMMUNITY)
Admission: EM | Admit: 2013-06-18 | Discharge: 2013-06-18 | Disposition: A | Discharge status: Home / Self Care | Payer: Medicare Other | Attending: Emergency Medicine | Admitting: Emergency Medicine

## 2013-06-18 ENCOUNTER — Encounter (HOSPITAL_COMMUNITY): Payer: Self-pay | Admitting: Emergency Medicine

## 2013-06-18 DIAGNOSIS — R0902 Hypoxemia: Secondary | ICD-10-CM

## 2013-06-18 DIAGNOSIS — E785 Hyperlipidemia, unspecified: Secondary | ICD-10-CM

## 2013-06-18 DIAGNOSIS — F411 Generalized anxiety disorder: Secondary | ICD-10-CM | POA: Insufficient documentation

## 2013-06-18 DIAGNOSIS — J984 Other disorders of lung: Secondary | ICD-10-CM

## 2013-06-18 DIAGNOSIS — E7800 Pure hypercholesterolemia, unspecified: Secondary | ICD-10-CM | POA: Insufficient documentation

## 2013-06-18 DIAGNOSIS — I10 Essential (primary) hypertension: Secondary | ICD-10-CM

## 2013-06-18 DIAGNOSIS — R51 Headache: Secondary | ICD-10-CM | POA: Insufficient documentation

## 2013-06-18 DIAGNOSIS — J9610 Chronic respiratory failure, unspecified whether with hypoxia or hypercapnia: Secondary | ICD-10-CM

## 2013-06-18 DIAGNOSIS — Z720 Tobacco use: Secondary | ICD-10-CM

## 2013-06-18 DIAGNOSIS — G8929 Other chronic pain: Secondary | ICD-10-CM

## 2013-06-18 DIAGNOSIS — M1990 Unspecified osteoarthritis, unspecified site: Secondary | ICD-10-CM

## 2013-06-18 DIAGNOSIS — Z9981 Dependence on supplemental oxygen: Secondary | ICD-10-CM | POA: Insufficient documentation

## 2013-06-18 DIAGNOSIS — J441 Chronic obstructive pulmonary disease with (acute) exacerbation: Secondary | ICD-10-CM

## 2013-06-18 DIAGNOSIS — M549 Dorsalgia, unspecified: Secondary | ICD-10-CM | POA: Insufficient documentation

## 2013-06-18 DIAGNOSIS — J189 Pneumonia, unspecified organism: Secondary | ICD-10-CM

## 2013-06-18 DIAGNOSIS — A419 Sepsis, unspecified organism: Secondary | ICD-10-CM

## 2013-06-18 DIAGNOSIS — J45909 Unspecified asthma, uncomplicated: Secondary | ICD-10-CM | POA: Insufficient documentation

## 2013-06-18 DIAGNOSIS — F419 Anxiety disorder, unspecified: Secondary | ICD-10-CM

## 2013-06-18 DIAGNOSIS — F17200 Nicotine dependence, unspecified, uncomplicated: Secondary | ICD-10-CM | POA: Insufficient documentation

## 2013-06-18 DIAGNOSIS — J449 Chronic obstructive pulmonary disease, unspecified: Secondary | ICD-10-CM

## 2013-06-18 DIAGNOSIS — J9620 Acute and chronic respiratory failure, unspecified whether with hypoxia or hypercapnia: Secondary | ICD-10-CM

## 2013-06-18 DIAGNOSIS — T50905A Adverse effect of unspecified drugs, medicaments and biological substances, initial encounter: Secondary | ICD-10-CM

## 2013-06-18 DIAGNOSIS — J4489 Other specified chronic obstructive pulmonary disease: Secondary | ICD-10-CM

## 2013-06-18 DIAGNOSIS — D494 Neoplasm of unspecified behavior of bladder: Secondary | ICD-10-CM

## 2013-06-18 DIAGNOSIS — K219 Gastro-esophageal reflux disease without esophagitis: Secondary | ICD-10-CM | POA: Insufficient documentation

## 2013-06-18 DIAGNOSIS — R739 Hyperglycemia, unspecified: Secondary | ICD-10-CM

## 2013-06-18 DIAGNOSIS — R911 Solitary pulmonary nodule: Secondary | ICD-10-CM | POA: Insufficient documentation

## 2013-06-18 DIAGNOSIS — R310 Gross hematuria: Secondary | ICD-10-CM

## 2013-06-18 DIAGNOSIS — N400 Enlarged prostate without lower urinary tract symptoms: Secondary | ICD-10-CM

## 2013-06-18 LAB — CBC WITH DIFFERENTIAL/PLATELET
BASOS ABS: 0 10*3/uL (ref 0.0–0.1)
Basophils Relative: 0 % (ref 0–1)
EOS ABS: 0.4 10*3/uL (ref 0.0–0.7)
EOS PCT: 3 % (ref 0–5)
HCT: 44 % (ref 39.0–52.0)
Hemoglobin: 14.2 g/dL (ref 13.0–17.0)
Lymphocytes Relative: 27 % (ref 12–46)
Lymphs Abs: 3.2 10*3/uL (ref 0.7–4.0)
MCH: 31.9 pg (ref 26.0–34.0)
MCHC: 32.3 g/dL (ref 30.0–36.0)
MCV: 98.9 fL (ref 78.0–100.0)
MONO ABS: 0.8 10*3/uL (ref 0.1–1.0)
Monocytes Relative: 7 % (ref 3–12)
Neutro Abs: 7.4 10*3/uL (ref 1.7–7.7)
Neutrophils Relative %: 63 % (ref 43–77)
PLATELETS: 241 10*3/uL (ref 150–400)
RBC: 4.45 MIL/uL (ref 4.22–5.81)
RDW: 14.1 % (ref 11.5–15.5)
WBC: 11.9 10*3/uL — AB (ref 4.0–10.5)

## 2013-06-18 LAB — URINALYSIS, ROUTINE W REFLEX MICROSCOPIC
Bilirubin Urine: NEGATIVE
GLUCOSE, UA: NEGATIVE mg/dL
KETONES UR: NEGATIVE mg/dL
Leukocytes, UA: NEGATIVE
Nitrite: NEGATIVE
PROTEIN: NEGATIVE mg/dL
Specific Gravity, Urine: <1.005 — ABNORMAL LOW (ref 1.005–1.030)
UROBILINOGEN UA: 0.2 mg/dL (ref 0.0–1.0)
pH: 6 (ref 5.0–8.0)

## 2013-06-18 LAB — BASIC METABOLIC PANEL
BUN: 8 mg/dL (ref 6–23)
CALCIUM: 9.8 mg/dL (ref 8.4–10.5)
CO2: 36 mEq/L — ABNORMAL HIGH (ref 19–32)
CREATININE: 0.81 mg/dL (ref 0.50–1.35)
Chloride: 97 mEq/L (ref 96–112)
GFR calc Af Amer: >90 mL/min (ref >90)
GFR, EST NON AFRICAN AMERICAN: 87 mL/min — AB (ref >90)
Glucose, Bld: 111 mg/dL — ABNORMAL HIGH (ref 70–99)
Potassium: 4.8 mEq/L (ref 3.7–5.3)
SODIUM: 140 meq/L (ref 137–147)

## 2013-06-18 LAB — LACTIC ACID, PLASMA: LACTIC ACID, VENOUS: 0.7 mmol/L (ref 0.5–2.2)

## 2013-06-18 LAB — URINE MICROSCOPIC-ADD ON

## 2013-06-18 MED ORDER — PREDNISONE 50 MG PO TABS
60.0000 mg | ORAL_TABLET | Freq: Once | ORAL | Status: AC
  Administered 2013-06-18: 60 mg via ORAL
  Filled 2013-06-18 (×2): qty 1

## 2013-06-18 MED ORDER — METHYLPREDNISOLONE SODIUM SUCC 125 MG IJ SOLR
125.0000 mg | Freq: Once | INTRAMUSCULAR | Status: DC
  Filled 2013-06-18: qty 2

## 2013-06-18 MED ORDER — LEVOFLOXACIN 500 MG PO TABS: 500.0000 mg | ORAL_TABLET | Freq: Every day | ORAL | Status: AC

## 2013-06-18 MED ORDER — IPRATROPIUM BROMIDE 0.02 % IN SOLN
1.0000 mg | Freq: Once | RESPIRATORY_TRACT | Status: AC
  Administered 2013-06-18: 1 mg via RESPIRATORY_TRACT
  Filled 2013-06-18: qty 5

## 2013-06-18 MED ORDER — ALBUTEROL (5 MG/ML) CONTINUOUS INHALATION SOLN
10.0000 mg/h | INHALATION_SOLUTION | Freq: Once | RESPIRATORY_TRACT | Status: AC
  Administered 2013-06-18: 10 mg/h via RESPIRATORY_TRACT
  Filled 2013-06-18: qty 20

## 2013-06-18 MED ORDER — PREDNISONE 20 MG PO TABS: ORAL_TABLET | ORAL | Status: DC

## 2013-06-18 NOTE — Consult Note (Signed)
[facility] Medical Consultation  [name] [name] WGN:[id number] DOB: 02/13/1941 DOA: 06/18/2013 PCP: Theodoro Clock   Requesting physician: Dr Thurnell Garbe  Date of consultation: 06/18/13  Reason for consultation: Dyspnoea  Impression/Recommendations    1. COPD exacerbation,improving,almost back to baseline. 2. Hypertension  In my opinion,this patient can be discharged home with tapering course of steroids and one week course of antibiotics.  Chief Complaint: Dyspnoea with no significant cough,no fever.  HPI:  This is a 73yr old man who has oxygen dependent COPD who has had more wheezing than usual over the last few days.No fever,no productive cough.  Review of Systems:  Apart from symptoms above,no other symptoms in all systems reveiwed.  Past Medical History  Diagnosis Date  . COPD (chronic obstructive pulmonary disease)   . Asthma   . Tobacco abuse 09/29/2011  . Hypercholesteremia   . GERD (gastroesophageal reflux disease)   . Headache(784.0)   . BPH (benign prostatic hyperplasia) 10/01/2011    Per cystoscopy  . 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  . Pulmonary nodule 09/2011    Stable appearance  . Chronic back pain   . Chronic anxiety   . Hyperlipidemia 03/13/2012  . Anxiety 03/13/2012  . On home O2     2L N/C continuous   Past Surgical History  Procedure Laterality Date  . Cystoscopy  09/30/2011    Procedure: CYSTOSCOPY FLEXIBLE;  Surgeon: Marissa Nestle, MD;  Location: [facility];  Service: Urology;  Laterality: N/A;  . Transurethral resection of bladder tumor  10/01/2011    Procedure: TRANSURETHRAL RESECTION OF BLADDER TUMOR (TURBT);  Surgeon: Marissa Nestle, MD;  Location: [facility];  Service: Urology;  Laterality: N/A;  . Fracture surgery      R ring finger, pin placed  . Cardiac catheterization  2003    [facility]   Social History:  reports that he has been smoking Cigarettes.  He has a 58 pack-year smoking history. He does not have  any smokeless tobacco history on file. He reports that he does not drink alcohol or use illicit drugs.  Allergies  Allergen Reactions  . Iohexol Hives     Code: HIVES, Desc: PT STATES HE BROKE OUT IN HIVES AND RASH AFTER CT NECK EARLY SEPT 2011; NO RESP PROBLEMS; NEEDS PRE-MEDS; MKS, Onset Date: [id number]    Family History  Problem Relation Age of Onset  . Emphysema Brother     was not close to family; does not truly know their medical problems.  . Emphysema Brother   . Emphysema Brother   . Cancer Neg Hx     Prior to Admission medications   Medication Sig Start Date End Date Taking? Authorizing Provider  albuterol (PROVENTIL) (2.5 MG/3ML) 0.083% nebulizer solution Take 2.5 mg by nebulization every 4 (four) hours as needed. For shortness of breath   Yes Historical Provider, MD  albuterol-ipratropium (COMBIVENT) 18-103 MCG/ACT inhaler Inhale 2 puffs into the lungs 4 (four) times daily. 03/30/13  Yes Rexene Alberts, MD  ALPRAZolam Duanne Moron) 1 MG tablet Take 1 tablet (1 mg total) by mouth 2 (two) times daily as needed. anxiety 02/20/13  Yes Domenic Polite, MD  aspirin EC 81 MG tablet Take 81 mg by mouth daily. 10/02/11  Yes Rexene Alberts, MD  budesonide-formoterol [facility]) 160-4.5 MCG/ACT inhaler Inhale 2 puffs into the lungs 2 (two) times daily. 03/30/13  Yes Rexene Alberts, MD  Cholecalciferol (VITAMIN D3) 1000 UNITS CAPS Take 1 capsule by mouth daily.  Yes Historical Provider, MD  Garlic 1937 MG CAPS Take 1 capsule by mouth daily.    Yes Historical Provider, MD  HYDROcodone-acetaminophen (NORCO) 10-325 MG per tablet Take 1 tablet by mouth every 6 (six) hours as needed. For pain 06/14/13  Yes Historical Provider, MD  Multiple Vitamins-Minerals (CENTRUM) tablet Take 1 tablet by mouth daily.    Yes Historical Provider, MD  NITROSTAT 0.4 MG SL tablet Place 1 tablet under the tongue as needed for chest pain.  02/22/13  Yes Historical Provider, MD  Omega-3 Fatty Acids (FISH OIL PO) Take 1 capsule  by mouth daily.   Yes Historical Provider, MD  pantoprazole (PROTONIX) 40 MG tablet Take 40 mg by mouth daily.   Yes Historical Provider, MD  simvastatin (ZOCOR) 40 MG tablet Take 40 mg by mouth daily.    Yes Historical Provider, MD  levofloxacin (LEVAQUIN) 500 MG tablet Take 1 tablet (500 mg total) by mouth daily. 06/18/13 06/28/13  Lynelle Weiler Luther Parody, MD  predniSONE (DELTASONE) 20 MG tablet Take 2 tabs daily for 3 days,1 tab daily for 3 days,1/2 tab daily for 3 days,then STOP 06/18/13   Doree Albee, MD   Physical Exam: Blood pressure 111/69, pulse 98, temperature 98.1 F (36.7 C), temperature source Oral, resp. rate 20, height 5\' 5"  (1.651 m), weight 76.204 kg (168 lb), SpO2 89.00%. Filed Vitals:   06/18/13 2021  BP: 111/69  Pulse: 98  Temp:   Resp:      General:  Looks well,non-toxic  Eyes: WNL  ENT: WNL  Neck: No lymphadenopathy  Cardiovascular: Heart sounds normal,no gallup,no rub  Respiratory: Scattered wheezing,not tight,no crackles,no brochial breathing  Abdomen: soft,non-tender  Skin: no rash  Musculoskeletal: no acute abnormalities  Psychiatric: appropriate affect  Neurologic: No focal signs,alert and orientated  Labs on Admission:  Basic Metabolic Panel:  Recent Labs Lab 06/18/13 1713  NA 140  K 4.8  CL 97  CO2 36*  GLUCOSE 111*  BUN 8  CREATININE 0.81  CALCIUM 9.8       CBC:  Recent Labs Lab 06/18/13 1713  WBC 11.9*  NEUTROABS 7.4  HGB 14.2  HCT 44.0  MCV 98.9  PLT 241      Radiological Exams on Admission: Dg Chest 2 View  06/18/2013   CLINICAL DATA:  Shortness of breath, cough.  EXAM: CHEST  2 VIEW  COMPARISON:  03/25/2013  FINDINGS: There is hyperinflation of the lungs compatible with COPD. Chronic increased interstitial markings throughout the lungs. Scarring in the right upper lobe, stable. No acute opacities or effusions. Heart is normal size. No acute bony abnormality.  IMPRESSION: COPD/chronic changes.  No active disease.    Electronically Signed   By: Rolm Baptise M.D.   On: 06/18/2013 17:21      Time spent: 30 mins  [facility]   If 7PM-7AM, please contact night-coverage [email or web address] Password [facility] 06/18/2013, 9:45 PM

## 2013-06-18 NOTE — ED Provider Notes (Signed)
CSN: [id number]     Arrival date & time 06/18/13  1620 History   First MD Initiated Contact with Patient 06/18/13 1801     Chief Complaint  Patient presents with  . Shortness of Breath      HPI Pt was seen at 1825.  Per pt and his family, c/o gradual onset and worsening of persistent cough, wheezing and SOB for the past 2 to 3 days.  Describes his symptoms as "my COPD is acting up."  Has been using home MDI and nebs with transient relief. Has been associated with home fevers to "101," as well as runny/stuffy nose, sinus congestion, ears "congestion" and frontal headache for the past 2 to 3 days. Denies CP/palpitations, no back pain, no abd pain, no N/V/D, no rash.     Past Medical History  Diagnosis Date  . COPD (chronic obstructive pulmonary disease)   . Asthma   . Tobacco abuse 09/29/2011  . Hypercholesteremia   . GERD (gastroesophageal reflux disease)   . Headache(784.0)   . BPH (benign prostatic hyperplasia) 10/01/2011    Per cystoscopy  . 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  . Pulmonary nodule 09/2011    Stable appearance  . Chronic back pain   . Chronic anxiety   . Hyperlipidemia 03/13/2012  . Anxiety 03/13/2012  . On home O2     2L N/C continuous   Past Surgical History  Procedure Laterality Date  . Cystoscopy  09/30/2011    Procedure: CYSTOSCOPY FLEXIBLE;  Surgeon: Marissa Nestle, MD;  Location: [facility];  Service: Urology;  Laterality: N/A;  . Transurethral resection of bladder tumor  10/01/2011    Procedure: TRANSURETHRAL RESECTION OF BLADDER TUMOR (TURBT);  Surgeon: Marissa Nestle, MD;  Location: [facility];  Service: Urology;  Laterality: N/A;  . Fracture surgery      R ring finger, pin placed  . Cardiac catheterization  2003    [facility]   Family History  Problem Relation Age of Onset  . Emphysema Brother     was not close to family; does not truly know their medical problems.  . Emphysema Brother   . Emphysema Brother   . Cancer Neg Hx     History  Substance Use Topics  . Smoking status: Current Every Day Smoker -- 1.00 packs/day for 58 years    Types: Cigarettes  . Smokeless tobacco: Not on file  . Alcohol Use: No    Review of Systems ROS: Statement: All systems negative except as marked or noted in the HPI; Constitutional: +fever and chills. ; ; Eyes: Negative for eye pain, redness and discharge. ; ; ENMT: Negative for ear pain, hoarseness, sore throat. +ears congestion, rhinorrhea, nasal congestion, sinus pressure. ; ; Cardiovascular: Negative for chest pain, palpitations, diaphoresis and peripheral edema. ; ; Respiratory: +cough, wheezing, SOB. Negative for stridor. ; ; Gastrointestinal: Negative for nausea, vomiting, diarrhea, abdominal pain, blood in stool, hematemesis, jaundice and rectal bleeding. . ; ; Genitourinary: Negative for dysuria, flank pain and hematuria. ; ; Musculoskeletal: Negative for back pain and neck pain. Negative for swelling and trauma.; ; Skin: Negative for pruritus, rash, abrasions, blisters, bruising and skin lesion.; ; Neuro: Negative for headache, lightheadedness and neck stiffness. Negative for weakness, altered level of consciousness , altered mental status, extremity weakness, paresthesias, involuntary movement, seizure and syncope.      Allergies  Iohexol  Home Medications   Current Outpatient Rx  Name  Route  Sig  Dispense  Refill  . albuterol (PROVENTIL) (2.5 MG/3ML) 0.083% nebulizer solution   Nebulization   Take 2.5 mg by nebulization every 4 (four) hours as needed. For shortness of breath         . albuterol-ipratropium (COMBIVENT) 18-103 MCG/ACT inhaler   Inhalation   Inhale 2 puffs into the lungs 4 (four) times daily.   1 Inhaler   12   . ALPRAZolam (XANAX) 1 MG tablet   Oral   Take 1 tablet (1 mg total) by mouth 2 (two) times daily as needed. anxiety   20 tablet   0   . aspirin EC 81 MG tablet   Oral   Take 81 mg by mouth daily.         . budesonide-formoterol  (SYMBICORT) 160-4.5 MCG/ACT inhaler   Inhalation   Inhale 2 puffs into the lungs 2 (two) times daily.   1 Inhaler   12   . Cholecalciferol (VITAMIN D3) 1000 UNITS CAPS   Oral   Take 1 capsule by mouth daily.          . Garlic 123XX123 MG CAPS   Oral   Take 1 capsule by mouth daily.          Marland Kitchen HYDROcodone-acetaminophen (NORCO) 10-325 MG per tablet   Oral   Take 1 tablet by mouth every 6 (six) hours as needed. For pain         . Multiple Vitamins-Minerals (CENTRUM) tablet   Oral   Take 1 tablet by mouth daily.          Marland Kitchen NITROSTAT 0.4 MG SL tablet   Sublingual   Place 1 tablet under the tongue as needed for chest pain.          . Omega-3 Fatty Acids (FISH OIL PO)   Oral   Take 1 capsule by mouth daily.         . pantoprazole (PROTONIX) 40 MG tablet   Oral   Take 40 mg by mouth daily.         . simvastatin (ZOCOR) 40 MG tablet   Oral   Take 40 mg by mouth daily.           BP 95/58  Pulse 88  Temp(Src) 98.1 F (36.7 C) (Oral)  Resp 20  Ht 5\' 5"  (1.651 m)  Wt 168 lb (76.204 kg)  BMI 27.96 kg/m2  SpO2 100% Physical Exam 1830: Physical examination:  Nursing notes reviewed; Vital signs and O2 SAT reviewed;  Constitutional: Well developed, Well nourished, Well hydrated, In no acute distress; Head:  Normocephalic, atraumatic; Eyes: EOMI, PERRL, No scleral icterus; ENMT: TM's clear bilat. +edemetous nasal turbinates bilat with clear rhinorrhea. Mouth and pharynx normal, Mucous membranes moist; Neck: Supple, Full range of motion, No lymphadenopathy; Cardiovascular: Regular rate and rhythm, No gallop; Respiratory: Breath sounds diminished & equal bilaterally, scattered insp/exp wheezes, occasional audible wheezing. Speaking short sentences. Sitting upright. Mild tachypnea. No retrax or access mm use.; Chest: Nontender, Movement normal; Abdomen: Soft, Nontender, Nondistended, Normal bowel sounds; Genitourinary: No CVA tenderness; Extremities: Pulses normal, No  tenderness, No edema, No calf edema or asymmetry.; Neuro: AA&Ox3, Major CN grossly intact.  Speech clear. No gross focal motor or sensory deficits in extremities.; Skin: Color normal, Warm, Dry.   ED Course  Procedures    [id number]:  Pt has significant hx of COPD, on home O2 2L N/C. Pt sitting upright, wheezing on arrival: will start hour long neb and dose IV solumedrol.  1930:  Pt  refuses IV placement for solumedrol. Will dose PO prednisone.   2030:  Neb completed. Pt ambulated with his usual O2 2L N/C: Sats dropped to 86%, and pt had increasing RR and insp/exp wheezing bilat. Remains afebrile. Dx and testing d/w pt and family.  Questions answered.  Verb understanding, agreeable to observation admit. T/C to [facility] Dr. Anastasio Champion, case discussed, including:  HPI, pertinent PM/SHx, VS/PE, dx testing, ED course and treatment:  Agreeable to come to ED for eval to admit.       EKG Interpretation None      MDM  MDM Reviewed: previous chart, nursing note and vitals Reviewed previous: labs Interpretation: labs and x-ray Total time providing critical care: 30-74 minutes. This excludes time spent performing separately reportable procedures and services. Consults: admitting MD   CRITICAL CARE Performed by: Alfonzo Feller Total critical care time: 35 Critical care time was exclusive of separately billable procedures and treating other patients. Critical care was necessary to treat or prevent imminent or life-threatening deterioration. Critical care was time spent personally by me on the following activities: development of treatment plan with patient and/or surrogate as well as nursing, discussions with consultants, evaluation of patient's response to treatment, examination of patient, obtaining history from patient or surrogate, ordering and performing treatments and interventions, ordering and review of laboratory studies, ordering and review of radiographic studies, pulse oximetry and  re-evaluation of patient's condition.   Results for orders placed during the hospital encounter of 06/18/13  CBC WITH DIFFERENTIAL      Result Value Ref Range   WBC 11.9 (*) 4.0 - 10.5 K/uL   RBC 4.45  4.22 - 5.81 MIL/uL   Hemoglobin 14.2  13.0 - 17.0 g/dL   HCT 44.0  39.0 - 52.0 %   MCV 98.9  78.0 - 100.0 fL   MCH 31.9  26.0 - 34.0 pg   MCHC 32.3  30.0 - 36.0 g/dL   RDW 14.1  11.5 - 15.5 %   Platelets 241  150 - 400 K/uL   Neutrophils Relative % 63  43 - 77 %   Neutro Abs 7.4  1.7 - 7.7 K/uL   Lymphocytes Relative 27  12 - 46 %   Lymphs Abs 3.2  0.7 - 4.0 K/uL   Monocytes Relative 7  3 - 12 %   Monocytes Absolute 0.8  0.1 - 1.0 K/uL   Eosinophils Relative 3  0 - 5 %   Eosinophils Absolute 0.4  0.0 - 0.7 K/uL   Basophils Relative 0  0 - 1 %   Basophils Absolute 0.0  0.0 - 0.1 K/uL  BASIC METABOLIC PANEL      Result Value Ref Range   Sodium 140  137 - 147 mEq/L   Potassium 4.8  3.7 - 5.3 mEq/L   Chloride 97  96 - 112 mEq/L   CO2 36 (*) 19 - 32 mEq/L   Glucose, Bld 111 (*) 70 - 99 mg/dL   BUN 8  6 - 23 mg/dL   Creatinine, Ser 0.81  0.50 - 1.35 mg/dL   Calcium 9.8  8.4 - 10.5 mg/dL   GFR calc non Af Amer 87 (*) >90 mL/min   GFR calc Af Amer >90  >90 mL/min  URINALYSIS, ROUTINE W REFLEX MICROSCOPIC      Result Value Ref Range   Color, Urine YELLOW  YELLOW   APPearance CLEAR  CLEAR   Specific Gravity, Urine <1.005 (*) 1.005 - 1.030   pH 6.0  5.0 -  8.0   Glucose, UA NEGATIVE  NEGATIVE mg/dL   Hgb urine dipstick TRACE (*) NEGATIVE   Bilirubin Urine NEGATIVE  NEGATIVE   Ketones, ur NEGATIVE  NEGATIVE mg/dL   Protein, ur NEGATIVE  NEGATIVE mg/dL   Urobilinogen, UA 0.2  0.0 - 1.0 mg/dL   Nitrite NEGATIVE  NEGATIVE   Leukocytes, UA NEGATIVE  NEGATIVE  LACTIC ACID, PLASMA      Result Value Ref Range   Lactic Acid, Venous 0.7  0.5 - 2.2 mmol/L  URINE MICROSCOPIC-ADD ON      Result Value Ref Range   RBC / HPF 0-2  <3 RBC/hpf   Dg Chest 2 View 06/18/2013   CLINICAL DATA:   Shortness of breath, cough.  EXAM: CHEST  2 VIEW  COMPARISON:  03/25/2013  FINDINGS: There is hyperinflation of the lungs compatible with COPD. Chronic increased interstitial markings throughout the lungs. Scarring in the right upper lobe, stable. No acute opacities or effusions. Heart is normal size. No acute bony abnormality.  IMPRESSION: COPD/chronic changes.  No active disease.   Electronically Signed   By: Rolm Baptise M.D.   On: 06/18/2013 17:21      Alfonzo Feller, DO 06/21/13 1542

## 2013-06-18 NOTE — ED Notes (Signed)
Dr. Anastasio Champion gave this nurse prescriptions and stated patient was okay to be discharged home at this time. This nurse instructed by Dr. Thurnell Garbe for patient to follow up with MD on Monday and to return to ED for new or worsening symptoms.

## 2013-06-18 NOTE — ED Notes (Signed)
Sob, headache, fever.cough,  Home 02 at 3 L

## 2013-06-18 NOTE — ED Notes (Signed)
Pt. States he is unable to void due to lack of fluids today.

## 2013-07-24 ENCOUNTER — Emergency Department (HOSPITAL_COMMUNITY)
Admission: EM | Admit: 2013-07-24 | Discharge: 2013-07-24 | Disposition: A | Discharge status: Home / Self Care | Payer: Medicare Other | Attending: Emergency Medicine | Admitting: Emergency Medicine

## 2013-07-24 ENCOUNTER — Encounter (HOSPITAL_COMMUNITY): Payer: Self-pay | Admitting: Emergency Medicine

## 2013-07-24 ENCOUNTER — Emergency Department (HOSPITAL_COMMUNITY): Payer: Medicare Other

## 2013-07-24 ENCOUNTER — Other Ambulatory Visit: Payer: Self-pay

## 2013-07-24 DIAGNOSIS — Z8781 Personal history of (healed) traumatic fracture: Secondary | ICD-10-CM | POA: Insufficient documentation

## 2013-07-24 DIAGNOSIS — J441 Chronic obstructive pulmonary disease with (acute) exacerbation: Secondary | ICD-10-CM | POA: Insufficient documentation

## 2013-07-24 DIAGNOSIS — R51 Headache: Secondary | ICD-10-CM | POA: Insufficient documentation

## 2013-07-24 DIAGNOSIS — J45901 Unspecified asthma with (acute) exacerbation: Principal | ICD-10-CM

## 2013-07-24 DIAGNOSIS — Z79899 Other long term (current) drug therapy: Secondary | ICD-10-CM | POA: Insufficient documentation

## 2013-07-24 DIAGNOSIS — Z87448 Personal history of other diseases of urinary system: Secondary | ICD-10-CM | POA: Insufficient documentation

## 2013-07-24 DIAGNOSIS — G8929 Other chronic pain: Secondary | ICD-10-CM | POA: Insufficient documentation

## 2013-07-24 DIAGNOSIS — J449 Chronic obstructive pulmonary disease, unspecified: Secondary | ICD-10-CM

## 2013-07-24 DIAGNOSIS — Z7982 Long term (current) use of aspirin: Secondary | ICD-10-CM | POA: Insufficient documentation

## 2013-07-24 DIAGNOSIS — E785 Hyperlipidemia, unspecified: Secondary | ICD-10-CM | POA: Insufficient documentation

## 2013-07-24 DIAGNOSIS — F17200 Nicotine dependence, unspecified, uncomplicated: Secondary | ICD-10-CM | POA: Insufficient documentation

## 2013-07-24 DIAGNOSIS — F411 Generalized anxiety disorder: Secondary | ICD-10-CM | POA: Insufficient documentation

## 2013-07-24 DIAGNOSIS — M549 Dorsalgia, unspecified: Secondary | ICD-10-CM | POA: Insufficient documentation

## 2013-07-24 DIAGNOSIS — J40 Bronchitis, not specified as acute or chronic: Secondary | ICD-10-CM

## 2013-07-24 DIAGNOSIS — K219 Gastro-esophageal reflux disease without esophagitis: Secondary | ICD-10-CM | POA: Insufficient documentation

## 2013-07-24 DIAGNOSIS — Z9981 Dependence on supplemental oxygen: Secondary | ICD-10-CM | POA: Insufficient documentation

## 2013-07-24 DIAGNOSIS — Z9889 Other specified postprocedural states: Secondary | ICD-10-CM | POA: Insufficient documentation

## 2013-07-24 HISTORY — DX: Emphysema, unspecified: J43.9

## 2013-07-24 LAB — COMPREHENSIVE METABOLIC PANEL
ALBUMIN: 3.8 g/dL (ref 3.5–5.2)
ALT: 16 U/L (ref 0–53)
AST: 20 U/L (ref 0–37)
Alkaline Phosphatase: 82 U/L (ref 39–117)
BUN: 11 mg/dL (ref 6–23)
CO2: 34 mEq/L — ABNORMAL HIGH (ref 19–32)
CREATININE: 0.84 mg/dL (ref 0.50–1.35)
Calcium: 9.6 mg/dL (ref 8.4–10.5)
Chloride: 98 mEq/L (ref 96–112)
GFR calc Af Amer: >90 mL/min (ref >90)
GFR calc non Af Amer: 86 mL/min — ABNORMAL LOW (ref >90)
Glucose, Bld: 104 mg/dL — ABNORMAL HIGH (ref 70–99)
Potassium: 4.5 mEq/L (ref 3.7–5.3)
Sodium: 139 mEq/L (ref 137–147)
Total Bilirubin: 0.3 mg/dL (ref 0.3–1.2)
Total Protein: 7.2 g/dL (ref 6.0–8.3)

## 2013-07-24 LAB — CBC WITH DIFFERENTIAL/PLATELET
BASOS ABS: 0.1 10*3/uL (ref 0.0–0.1)
BASOS PCT: 1 % (ref 0–1)
EOS PCT: 3 % (ref 0–5)
Eosinophils Absolute: 0.3 10*3/uL (ref 0.0–0.7)
HEMATOCRIT: 45.5 % (ref 39.0–52.0)
Hemoglobin: 15 g/dL (ref 13.0–17.0)
Lymphocytes Relative: 42 % (ref 12–46)
Lymphs Abs: 3.6 10*3/uL (ref 0.7–4.0)
MCH: 31.6 pg (ref 26.0–34.0)
MCHC: 33 g/dL (ref 30.0–36.0)
MCV: 96 fL (ref 78.0–100.0)
MONO ABS: 0.9 10*3/uL (ref 0.1–1.0)
Monocytes Relative: 11 % (ref 3–12)
Neutro Abs: 3.8 10*3/uL (ref 1.7–7.7)
Neutrophils Relative %: 44 % (ref 43–77)
Platelets: 232 10*3/uL (ref 150–400)
RBC: 4.74 MIL/uL (ref 4.22–5.81)
RDW: 13.9 % (ref 11.5–15.5)
WBC: 8.6 10*3/uL (ref 4.0–10.5)

## 2013-07-24 MED ORDER — LEVOFLOXACIN 500 MG PO TABS
500.0000 mg | ORAL_TABLET | Freq: Once | ORAL | Status: AC
  Administered 2013-07-24: 500 mg via ORAL
  Filled 2013-07-24: qty 1

## 2013-07-24 MED ORDER — ALBUTEROL SULFATE (2.5 MG/3ML) 0.083% IN NEBU
2.5000 mg | INHALATION_SOLUTION | Freq: Once | RESPIRATORY_TRACT | Status: AC
  Administered 2013-07-24: 2.5 mg via RESPIRATORY_TRACT
  Filled 2013-07-24: qty 3

## 2013-07-24 MED ORDER — IPRATROPIUM-ALBUTEROL 0.5-2.5 (3) MG/3ML IN SOLN
3.0000 mL | Freq: Once | RESPIRATORY_TRACT | Status: AC
Start: 2013-07-24 — End: 2013-07-24
  Administered 2013-07-24: 3 mL via RESPIRATORY_TRACT
  Filled 2013-07-24: qty 3

## 2013-07-24 MED ORDER — PREDNISONE 10 MG PO TABS: 20.0000 mg | ORAL_TABLET | Freq: Every day | ORAL | Status: DC

## 2013-07-24 MED ORDER — LEVOFLOXACIN 500 MG PO TABS: 500.0000 mg | ORAL_TABLET | Freq: Every day | ORAL | Status: DC

## 2013-07-24 MED ORDER — IPRATROPIUM-ALBUTEROL 0.5-2.5 (3) MG/3ML IN SOLN
3.0000 mL | Freq: Once | RESPIRATORY_TRACT | Status: AC
  Administered 2013-07-24: 3 mL via RESPIRATORY_TRACT
  Filled 2013-07-24: qty 3

## 2013-07-24 MED ORDER — METHYLPREDNISOLONE SODIUM SUCC 125 MG IJ SOLR
INTRAMUSCULAR | Status: AC
  Filled 2013-07-24: qty 2

## 2013-07-24 MED ORDER — METHYLPREDNISOLONE SODIUM SUCC 125 MG IJ SOLR
125.0000 mg | Freq: Once | INTRAMUSCULAR | Status: AC
  Administered 2013-07-24: 125 mg via INTRAVENOUS
  Filled 2013-07-24: qty 2

## 2013-07-24 NOTE — Discharge Instructions (Signed)
Follow up with your md this week for recheck.  Return this weekend if needed

## 2013-07-24 NOTE — ED Provider Notes (Signed)
CSN: [id number]     Arrival date & time 07/24/13  1113 History  This chart was scribed for Maudry Diego, MD by Terressa Koyanagi, ED Scribe. This patient was seen in room APA19/APA19 and the patient's care was started at 12:28 PM.  PCP: [name], PA-C   Chief Complaint  Patient presents with  . Shortness of Breath   Patient is a 73 y.o. male presenting with shortness of breath. The history is provided by the patient (the pt complains of sob). No language interpreter was used.  Shortness of Breath Severity:  Moderate Onset quality:  Sudden Timing:  Constant Progression:  Waxing and waning Chronicity:  Recurrent Context: activity   Relieved by:  Inhaler Worsened by:  Nothing tried Associated symptoms: cough   Associated symptoms: no abdominal pain, no chest pain, no headaches and no rash    HPI Comments: [name] [name] is a 73 y.o. male, with a history of COPD, emphysema, asthma, tobacco use, HLD, HA, high cholesterol, and high grade urothelial carcinoma, who presents to the Emergency Department complaining of worsening SOB onset this morning. Pt reports that when he woke up this morning he had a very difficult time breathing. Pt also complains of an associated productive cough. Pt reports that he has had similar symptoms before and has been hospitalized in the past for the same.   Pt is a current every day smoker that smokes 1 ppd.   Past Medical History  Diagnosis Date  . COPD (chronic obstructive pulmonary disease)   . Asthma   . Tobacco abuse 09/29/2011  . Hypercholesteremia   . GERD (gastroesophageal reflux disease)   . Headache(784.0)   . BPH (benign prostatic hyperplasia) 10/01/2011    Per cystoscopy  . 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  . Pulmonary nodule 09/2011    Stable appearance  . Chronic back pain   . Chronic anxiety   . Hyperlipidemia 03/13/2012  . Anxiety 03/13/2012  . On home O2     2L N/C continuous  . Emphysema lung    Past  Surgical History  Procedure Laterality Date  . Cystoscopy  09/30/2011    Procedure: CYSTOSCOPY FLEXIBLE;  Surgeon: Marissa Nestle, MD;  Location: [facility];  Service: Urology;  Laterality: N/A;  . Transurethral resection of bladder tumor  10/01/2011    Procedure: TRANSURETHRAL RESECTION OF BLADDER TUMOR (TURBT);  Surgeon: Marissa Nestle, MD;  Location: [facility];  Service: Urology;  Laterality: N/A;  . Fracture surgery      R ring finger, pin placed  . Cardiac catheterization  2003    [facility]   Family History  Problem Relation Age of Onset  . Emphysema Brother     was not close to family; does not truly know their medical problems.  . Emphysema Brother   . Emphysema Brother   . Cancer Neg Hx    History  Substance Use Topics  . Smoking status: Current Every Day Smoker -- 1.00 packs/day for 58 years    Types: Cigarettes  . Smokeless tobacco: Not on file  . Alcohol Use: No    Review of Systems  Constitutional: Negative for appetite change and fatigue.  HENT: Negative for congestion, ear discharge and sinus pressure.   Eyes: Negative for discharge.  Respiratory: Positive for cough and shortness of breath.   Cardiovascular: Negative for chest pain.  Gastrointestinal: Negative for abdominal pain and diarrhea.  Genitourinary: Negative for frequency and hematuria.  Musculoskeletal:  Negative for back pain.  Skin: Negative for rash.  Neurological: Negative for seizures and headaches.  Psychiatric/Behavioral: Negative for hallucinations.    A complete 10 system review of systems was obtained and all systems are negative except as noted in the HPI and PMH.   Allergies  Iohexol  Home Medications   Current Outpatient Rx  Name  Route  Sig  Dispense  Refill  . albuterol (PROVENTIL) (2.5 MG/3ML) 0.083% nebulizer solution   Nebulization   Take 2.5 mg by nebulization every 4 (four) hours as needed. For shortness of breath         . albuterol-ipratropium (COMBIVENT) 18-103 MCG/ACT  inhaler   Inhalation   Inhale 2 puffs into the lungs 4 (four) times daily.   1 Inhaler   12   . ALPRAZolam (XANAX) 1 MG tablet   Oral   Take 1 tablet (1 mg total) by mouth 2 (two) times daily as needed. anxiety   20 tablet   0   . aspirin EC 81 MG tablet   Oral   Take 81 mg by mouth daily.         . budesonide-formoterol (SYMBICORT) 160-4.5 MCG/ACT inhaler   Inhalation   Inhale 2 puffs into the lungs 2 (two) times daily.   1 Inhaler   12   . Cholecalciferol (VITAMIN D3) 1000 UNITS CAPS   Oral   Take 1 capsule by mouth daily.          . Garlic 0630 MG CAPS   Oral   Take 1 capsule by mouth daily.          Marland Kitchen HYDROcodone-acetaminophen (NORCO) 10-325 MG per tablet   Oral   Take 1 tablet by mouth every 6 (six) hours as needed. For pain         . Multiple Vitamins-Minerals (CENTRUM) tablet   Oral   Take 1 tablet by mouth daily.          Marland Kitchen NITROSTAT 0.4 MG SL tablet   Sublingual   Place 1 tablet under the tongue as needed for chest pain.          . Omega-3 Fatty Acids (FISH OIL PO)   Oral   Take 1 capsule by mouth daily.         . pantoprazole (PROTONIX) 40 MG tablet   Oral   Take 40 mg by mouth daily.         . predniSONE (DELTASONE) 20 MG tablet      Take 2 tabs daily for 3 days,1 tab daily for 3 days,1/2 tab daily for 3 days,then STOP   12 tablet   0   . simvastatin (ZOCOR) 40 MG tablet   Oral   Take 40 mg by mouth daily.           Triage Vitals: BP 121/83  Pulse 88  Temp(Src) 98 F (36.7 C) (Oral)  Resp 27  Ht 5\' 5"  (1.651 m)  Wt 165 lb (74.844 kg)  BMI 27.46 kg/m2  SpO2 96%  Physical Exam  Nursing note and vitals reviewed. Constitutional: He is oriented to person, place, and time. He appears well-developed and well-nourished. No distress.  HENT:  Head: Normocephalic and atraumatic.  Eyes: EOM are normal.  Neck: Neck supple. No tracheal deviation present.  Cardiovascular: Normal rate.   Pulmonary/Chest: Effort normal. No  respiratory distress. He has wheezes (moderate wheezing bilaterally ).  Musculoskeletal: Normal range of motion.  Neurological: He is alert and oriented to person,  place, and time.  Skin: Skin is warm and dry.  Psychiatric: He has a normal mood and affect. His behavior is normal.    ED Course  Procedures (including critical care time) DIAGNOSTIC STUDIES: Oxygen Saturation is 96% on RA, adequate by my interpretation.    COORDINATION OF CARE:  12:30 PM-Discussed treatment plan which includes meds, imaging and labs with pt at bedside and pt agreed to plan.   Labs Review Labs Reviewed - No data to display Imaging Review Dg Chest 2 View  07/24/2013   CLINICAL DATA:  Dyspnea, cough, and congestion with history of COPD on home oxygen ; currently still smoking.  EXAM: CHEST  2 VIEW  COMPARISON:  DG CHEST 2 VIEW dated 06/18/2013  FINDINGS: The lungs are mildly hyperinflated with hemidiaphragm flattening and increased AP dimension of the thorax. The interstitial markings are diffusely increased but stable. The cardiopericardial silhouette is normal in size. The pulmonary vascularity is not engorged. There is no pleural effusion or pneumothorax. The observed portions of the bony thorax appear normal.  IMPRESSION: There are chronic changes consistent with COPD and mild pulmonary fibrosis. There is no evidence of pneumonia nor CHF or other acute cardiopulmonary abnormality.   Electronically Signed   By: David  Martinique   On: 07/24/2013 11:58     EKG Interpretation None      MDM   Final diagnoses:  None    Pt improved with tx         The chart was scribed for me under my direct supervision.  I personally performed the history, physical, and medical decision making and all procedures in the evaluation of this patient.Marland Kitchen tx with steroids and antibiotics             The chart was scribed for me under my direct supervision.  I personally performed the history, physical, and medical decision making and  all procedures in the evaluation of this patient.Maudry Diego, MD 07/24/13 [phone]

## 2013-07-24 NOTE — ED Notes (Signed)
Pt c/o increased sob x 3 days. 

## 2013-07-30 ENCOUNTER — Other Ambulatory Visit: Payer: Self-pay

## 2013-07-30 ENCOUNTER — Emergency Department (HOSPITAL_COMMUNITY)
Admission: EM | Admit: 2013-07-30 | Discharge: 2013-07-30 | Disposition: A | Discharge status: Home / Self Care | Payer: Medicare Other | Attending: Emergency Medicine | Admitting: Emergency Medicine

## 2013-07-30 ENCOUNTER — Emergency Department (HOSPITAL_COMMUNITY): Payer: Medicare Other

## 2013-07-30 ENCOUNTER — Encounter (HOSPITAL_COMMUNITY): Payer: Self-pay | Admitting: Emergency Medicine

## 2013-07-30 DIAGNOSIS — Z9981 Dependence on supplemental oxygen: Secondary | ICD-10-CM | POA: Insufficient documentation

## 2013-07-30 DIAGNOSIS — E7800 Pure hypercholesterolemia, unspecified: Secondary | ICD-10-CM | POA: Insufficient documentation

## 2013-07-30 DIAGNOSIS — IMO0002 Reserved for concepts with insufficient information to code with codable children: Secondary | ICD-10-CM | POA: Insufficient documentation

## 2013-07-30 DIAGNOSIS — G8929 Other chronic pain: Secondary | ICD-10-CM | POA: Insufficient documentation

## 2013-07-30 DIAGNOSIS — J438 Other emphysema: Secondary | ICD-10-CM | POA: Insufficient documentation

## 2013-07-30 DIAGNOSIS — B370 Candidal stomatitis: Secondary | ICD-10-CM | POA: Insufficient documentation

## 2013-07-30 DIAGNOSIS — J449 Chronic obstructive pulmonary disease, unspecified: Secondary | ICD-10-CM | POA: Insufficient documentation

## 2013-07-30 DIAGNOSIS — F411 Generalized anxiety disorder: Secondary | ICD-10-CM | POA: Insufficient documentation

## 2013-07-30 DIAGNOSIS — J4489 Other specified chronic obstructive pulmonary disease: Secondary | ICD-10-CM | POA: Insufficient documentation

## 2013-07-30 DIAGNOSIS — Z7982 Long term (current) use of aspirin: Secondary | ICD-10-CM | POA: Insufficient documentation

## 2013-07-30 DIAGNOSIS — K219 Gastro-esophageal reflux disease without esophagitis: Secondary | ICD-10-CM | POA: Insufficient documentation

## 2013-07-30 DIAGNOSIS — F17200 Nicotine dependence, unspecified, uncomplicated: Secondary | ICD-10-CM | POA: Insufficient documentation

## 2013-07-30 DIAGNOSIS — D303 Benign neoplasm of bladder: Secondary | ICD-10-CM | POA: Insufficient documentation

## 2013-07-30 DIAGNOSIS — Z9889 Other specified postprocedural states: Secondary | ICD-10-CM | POA: Insufficient documentation

## 2013-07-30 LAB — URINALYSIS, ROUTINE W REFLEX MICROSCOPIC
Bilirubin Urine: NEGATIVE
Glucose, UA: NEGATIVE mg/dL
Hgb urine dipstick: NEGATIVE
Ketones, ur: NEGATIVE mg/dL
LEUKOCYTES UA: NEGATIVE
NITRITE: NEGATIVE
PH: 5.5 (ref 5.0–8.0)
Protein, ur: NEGATIVE mg/dL
Specific Gravity, Urine: 1.01 (ref 1.005–1.030)
Urobilinogen, UA: 0.2 mg/dL (ref 0.0–1.0)

## 2013-07-30 LAB — BASIC METABOLIC PANEL
BUN: 10 mg/dL (ref 6–23)
CO2: 32 meq/L (ref 19–32)
Calcium: 9.8 mg/dL (ref 8.4–10.5)
Chloride: 97 mEq/L (ref 96–112)
Creatinine, Ser: 0.79 mg/dL (ref 0.50–1.35)
GFR calc Af Amer: >90 mL/min (ref >90)
GFR, EST NON AFRICAN AMERICAN: 88 mL/min — AB (ref >90)
GLUCOSE: 125 mg/dL — AB (ref 70–99)
POTASSIUM: 4.1 meq/L (ref 3.7–5.3)
Sodium: 139 mEq/L (ref 137–147)

## 2013-07-30 LAB — CBC WITH DIFFERENTIAL/PLATELET
Basophils Absolute: 0 10*3/uL (ref 0.0–0.1)
Basophils Relative: 0 % (ref 0–1)
EOS ABS: 0 10*3/uL (ref 0.0–0.7)
Eosinophils Relative: 0 % (ref 0–5)
HCT: 43.2 % (ref 39.0–52.0)
HEMOGLOBIN: 14.8 g/dL (ref 13.0–17.0)
LYMPHS PCT: 20 % (ref 12–46)
Lymphs Abs: 2.7 10*3/uL (ref 0.7–4.0)
MCH: 32 pg (ref 26.0–34.0)
MCHC: 34.3 g/dL (ref 30.0–36.0)
MCV: 93.5 fL (ref 78.0–100.0)
MONOS PCT: 4 % (ref 3–12)
Monocytes Absolute: 0.6 10*3/uL (ref 0.1–1.0)
Neutro Abs: 10.4 10*3/uL — ABNORMAL HIGH (ref 1.7–7.7)
Neutrophils Relative %: 76 % (ref 43–77)
Platelets: 241 10*3/uL (ref 150–400)
RBC: 4.62 MIL/uL (ref 4.22–5.81)
RDW: 13.9 % (ref 11.5–15.5)
WBC: 13.6 10*3/uL — AB (ref 4.0–10.5)

## 2013-07-30 LAB — TROPONIN I

## 2013-07-30 LAB — PRO B NATRIURETIC PEPTIDE: Pro B Natriuretic peptide (BNP): 101.9 pg/mL (ref 0–125)

## 2013-07-30 MED ORDER — HYDROCODONE-ACETAMINOPHEN 5-325 MG PO TABS
2.0000 | ORAL_TABLET | Freq: Once | ORAL | Status: AC
  Administered 2013-07-30: 2 via ORAL
  Filled 2013-07-30: qty 2

## 2013-07-30 MED ORDER — NYSTATIN 100000 UNIT/ML MT SUSP: 500000.0000 [IU] | Freq: Four times a day (QID) | OROMUCOSAL | Status: DC

## 2013-07-30 MED ORDER — ALBUTEROL SULFATE (2.5 MG/3ML) 0.083% IN NEBU
5.0000 mg | INHALATION_SOLUTION | Freq: Once | RESPIRATORY_TRACT | Status: AC
  Administered 2013-07-30: 5 mg via RESPIRATORY_TRACT
  Filled 2013-07-30: qty 6

## 2013-07-30 MED ORDER — IPRATROPIUM BROMIDE 0.02 % IN SOLN
0.5000 mg | Freq: Once | RESPIRATORY_TRACT | Status: AC
  Administered 2013-07-30: 0.5 mg via RESPIRATORY_TRACT
  Filled 2013-07-30: qty 2.5

## 2013-07-30 MED ORDER — HYDROCODONE-ACETAMINOPHEN 5-325 MG PO TABS: 2.0000 | ORAL_TABLET | ORAL | Status: DC | PRN

## 2013-07-30 NOTE — ED Provider Notes (Signed)
CSN: [id number]     Arrival date & time 07/30/13  1704 History   First MD Initiated Contact with Patient 07/30/13 1733     Chief Complaint  Patient presents with  . Weakness     (Consider location/radiation/quality/duration/timing/severity/associated sxs/prior Treatment) HPI Comments: Patient presents to the ER for evaluation of multiple complaints. Patient has a history of COPD, was seen in the emergency room several days ago and started on steroids for COPD exacerbation. He was also placed on Levaquin. He has not seen any improvement. He has had progressively worsening shortness of breath and dyspnea on exertion. Now only walk across the room before he gets short of breath. He is not experiencing chest pain with the symptoms.  She reports that he has had dry mouth, soreness in his mouth. He has been gargling with Listerine and salt water it is not helping. He has not had any teeth. There is no swelling or lesions.  She reports generalized weakness. He has had a slight weight loss. He is simply not feeling like his normal self.  Patient is a 73 y.o. male presenting with weakness.  Weakness Associated symptoms include shortness of breath.    Past Medical History  Diagnosis Date  . COPD (chronic obstructive pulmonary disease)   . Asthma   . Tobacco abuse 09/29/2011  . Hypercholesteremia   . GERD (gastroesophageal reflux disease)   . Headache(784.0)   . BPH (benign prostatic hyperplasia) 10/01/2011    Per cystoscopy  . 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  . Pulmonary nodule 09/2011    Stable appearance  . Chronic back pain   . Chronic anxiety   . Hyperlipidemia 03/13/2012  . Anxiety 03/13/2012  . On home O2     2L N/C continuous  . Emphysema lung    Past Surgical History  Procedure Laterality Date  . Cystoscopy  09/30/2011    Procedure: CYSTOSCOPY FLEXIBLE;  Surgeon: Marissa Nestle, MD;  Location: [facility];  Service: Urology;  Laterality: N/A;  .  Transurethral resection of bladder tumor  10/01/2011    Procedure: TRANSURETHRAL RESECTION OF BLADDER TUMOR (TURBT);  Surgeon: Marissa Nestle, MD;  Location: [facility];  Service: Urology;  Laterality: N/A;  . Fracture surgery      R ring finger, pin placed  . Cardiac catheterization  2003    [facility]   Family History  Problem Relation Age of Onset  . Emphysema Brother     was not close to family; does not truly know their medical problems.  . Emphysema Brother   . Emphysema Brother   . Cancer Neg Hx    History  Substance Use Topics  . Smoking status: Current Every Day Smoker -- 1.00 packs/day for 58 years    Types: Cigarettes  . Smokeless tobacco: Not on file  . Alcohol Use: No    Review of Systems  Constitutional: Positive for unexpected weight change.  Respiratory: Positive for cough and shortness of breath.   Neurological: Positive for weakness.  All other systems reviewed and are negative.     Allergies  Iohexol  Home Medications   Prior to Admission medications   Medication Sig Start Date End Date Taking? Authorizing Provider  albuterol (PROVENTIL) (2.5 MG/3ML) 0.083% nebulizer solution Take 2.5 mg by nebulization every 4 (four) hours as needed. For shortness of breath    Historical Provider, MD  albuterol-ipratropium (COMBIVENT) 18-103 MCG/ACT inhaler Inhale 2 puffs into the lungs 4 (four) times daily.  03/30/13   Rexene Alberts, MD  ALPRAZolam Duanne Moron) 1 MG tablet Take 0.5-1 mg by mouth 2 (two) times daily as needed for anxiety. anxiety 02/20/13   Domenic Polite, MD  aspirin EC 81 MG tablet Take 81 mg by mouth daily. 10/02/11   Rexene Alberts, MD  budesonide-formoterol [facility]) 160-4.5 MCG/ACT inhaler Inhale 2 puffs into the lungs 2 (two) times daily. 03/30/13   Rexene Alberts, MD  Cholecalciferol (VITAMIN D3) 1000 UNITS CAPS Take 1 capsule by mouth daily.     Historical Provider, MD  Garlic 3329 MG CAPS Take 1 capsule by mouth daily.     Historical Provider, MD    HYDROcodone-acetaminophen (NORCO) 10-325 MG per tablet Take 1 tablet by mouth every 6 (six) hours as needed. For pain 06/14/13   Historical Provider, MD  levofloxacin (LEVAQUIN) 500 MG tablet Take 1 tablet (500 mg total) by mouth daily. X 7 days 07/24/13   Maudry Diego, MD  Multiple Vitamins-Minerals (CENTRUM) tablet Take 1 tablet by mouth daily.     Historical Provider, MD  NITROSTAT 0.4 MG SL tablet Place 1 tablet under the tongue as needed for chest pain.  02/22/13   Historical Provider, MD  pantoprazole (PROTONIX) 40 MG tablet Take 40 mg by mouth daily.    Historical Provider, MD  predniSONE (DELTASONE) 10 MG tablet Take 2 tablets (20 mg total) by mouth daily. 07/24/13   Maudry Diego, MD  simvastatin (ZOCOR) 40 MG tablet Take 40 mg by mouth daily.     Historical Provider, MD  tamsulosin (FLOMAX) 0.4 MG CAPS capsule Take 0.4 mg by mouth daily. 07/06/13   Historical Provider, MD   BP 118/69  Pulse 80  Temp(Src) 97.9 F (36.6 C) (Oral)  Resp 22  Ht 5\' 5"  (1.651 m)  Wt 167 lb 6 oz (75.921 kg)  BMI 27.85 kg/m2  SpO2 92% Physical Exam  Constitutional: He is oriented to person, place, and time. He appears well-developed and well-nourished. No distress.  HENT:  Head: Normocephalic and atraumatic.  Right Ear: Hearing normal.  Left Ear: Hearing normal.  Nose: Nose normal.  Mouth/Throat: Oropharynx is clear and moist and mucous membranes are normal.    Eyes: Conjunctivae and EOM are normal. Pupils are equal, round, and reactive to light.  Neck: Normal range of motion. Neck supple.  Cardiovascular: Regular rhythm, S1 normal and S2 normal.  Exam reveals no gallop and no friction rub.   No murmur heard. Pulmonary/Chest: Effort normal. No respiratory distress. He has wheezes. He exhibits no tenderness.  Abdominal: Soft. Normal appearance and bowel sounds are normal. There is no hepatosplenomegaly. There is no tenderness. There is no rebound, no guarding, no tenderness at McBurney's point and  negative Murphy's sign. No hernia.  Musculoskeletal: Normal range of motion.  Neurological: He is alert and oriented to person, place, and time. He has normal strength. No cranial nerve deficit or sensory deficit. Coordination normal. GCS eye subscore is 4. GCS verbal subscore is 5. GCS motor subscore is 6.  Skin: Skin is warm, dry and intact. No rash noted. No cyanosis.  Psychiatric: He has a normal mood and affect. His speech is normal and behavior is normal. Thought content normal.    ED Course  Procedures (including critical care time) Labs Review Labs Reviewed  CBC WITH DIFFERENTIAL - Abnormal; Notable for the following:    WBC 13.6 (*)    Neutro Abs 10.4 (*)    All other components within normal limits  BASIC METABOLIC PANEL -  Abnormal; Notable for the following:    Glucose, Bld 125 (*)    GFR calc non Af Amer 88 (*)    All other components within normal limits  PRO B NATRIURETIC PEPTIDE  TROPONIN I  URINALYSIS, ROUTINE W REFLEX MICROSCOPIC    Imaging Review Dg Chest Portable 1 View  07/30/2013   CLINICAL DATA:  Shortness of breath, weakness.  EXAM: PORTABLE CHEST - 1 VIEW  COMPARISON:  DG CHEST 2 VIEW dated 07/24/2013; CT CHEST W/O CM dated 06/21/2010; DG CHEST 1V PORT dated 07/11/2010  FINDINGS: Trachea is midline. Heart size stable. Lungs are emphysematous with probable scarring in the right upper lobe. No airspace consolidation or pleural fluid.  IMPRESSION: 1. Emphysema with probable scarring in the right upper lobe, similar to 07/11/2010. 2. No acute findings.   Electronically Signed   By: Lorin Picket M.D.   On: 07/30/2013 17:43     EKG Interpretation None      Date: 07/30/2013  Rate: 88  Rhythm: normal sinus rhythm  QRS Axis: indeterminate  Intervals: normal  ST/T Wave abnormalities: normal  Conduction Disutrbances:none  Narrative Interpretation:   Old EKG Reviewed: unchanged   MDM   Final diagnoses:  Thrush  COPD (chronic obstructive pulmonary disease)     Patient presented to ER for multiple complaints. Patient has had ongoing COPD exacerbation symptoms for 2 weeks. He was recently started on prednisone for this. He uses Symbicort, Combivent and albuterol nebulizers. He did have active wheezing upon arrival, but this resolved with albuterol 5 mg, Atrovent 0.5 mg. She is breathing much more comfortably, is normal oxygen. No hypoxia. Chest x-ray was clear, no pneumonia. The concern for congestive heart failure.  Patient is complaining of mouth and tongue pain. Examination reveals thrush. He is taking Symbicort as well as prednisone, likely the cause. He is not experiencing uncontrolled hyperglycemia at this time. We'll treat with nystatin.  Patient has a headache as well as other pains. He reports that he has been on Vicodin for for years intermittently, has none at home currently. Was given hydrocodone here in the ER with complete resolution of his headache.      Orpah Greek, MD 07/30/13 2200

## 2013-07-30 NOTE — ED Notes (Signed)
Ongoing weakness, cough x 2 wks.  Seen PCP x 3 days ago and placed on steroids.  Family reports pt has had increased irritability since starting med.  States is not feeling any better.

## 2013-07-30 NOTE — Discharge Instructions (Signed)
Chronic Obstructive Pulmonary Disease °Chronic obstructive pulmonary disease (COPD) is a common lung condition in which airflow from the lungs is limited. COPD is a general term that can be used to describe many different lung problems that limit airflow, including both chronic bronchitis and emphysema.  If you have COPD, your lung function will probably never return to normal, but there are measures you can take to improve lung function and make yourself feel better.  °CAUSES  °· Smoking (common).   °· Exposure to secondhand smoke.   °· Genetic problems. °· Chronic inflammatory lung diseases or recurrent infections. °SYMPTOMS  °· Shortness of breath, especially with physical activity.   °· Deep, persistent (chronic) cough with a large amount of thick mucus.   °· Wheezing.   °· Rapid breaths (tachypnea).   °· Gray or bluish discoloration (cyanosis) of the skin, especially in fingers, toes, or lips.   °· Fatigue.   °· Weight loss.   °· Frequent infections or episodes when breathing symptoms become much worse (exacerbations).   °· Chest tightness. °DIAGNOSIS  °Your healthcare provider will take a medical history and perform a physical examination to make the initial diagnosis.  Additional tests for COPD may include:  °· Lung (pulmonary) function tests. °· Chest X-ray. °· CT scan. °· Blood tests. °TREATMENT  °Treatment available to help you feel better when you have COPD include:  °· Inhaler and nebulizer medicines. These help manage the symptoms of COPD and make your breathing more comfortable °· Supplemental oxygen. Supplemental oxygen is only helpful if you have a low oxygen level in your blood.   °· Exercise and physical activity. These are beneficial for nearly all people with COPD. Some people may also benefit from a pulmonary rehabilitation program. °HOME CARE INSTRUCTIONS  °· Take all medicines (inhaled or pills) as directed by your health care provider. °· Only take over-the-counter or prescription medicines  for pain, fever, or discomfort as directed by your health care provider.   °· Avoid over-the-counter medicines or cough syrups that dry up your airway (such as antihistamines) and slow down the elimination of secretions unless instructed otherwise by your healthcare provider.   °· If you are a smoker, the most important thing that you can do is stop smoking. Continuing to smoke will cause further lung damage and breathing trouble. Ask your health care provider for help with quitting smoking. He or she can direct you to community resources or hospitals that provide support. °· Avoid exposure to irritants such as smoke, chemicals, and fumes that aggravate your breathing. °· Use oxygen therapy and pulmonary rehabilitation if directed by your health care provider. If you require home oxygen therapy, ask your healthcare provider whether you should purchase a pulse oximeter to measure your oxygen level at home.   °· Avoid contact with individuals who have a contagious illness. °· Avoid extreme temperature and humidity changes. °· Eat healthy foods. Eating smaller, more frequent meals and resting before meals may help you maintain your strength. °· Stay active, but balance activity with periods of rest. Exercise and physical activity will help you maintain your ability to do things you want to do. °· Preventing infection and hospitalization is very important when you have COPD. Make sure to receive all the vaccines your health care provider recommends, especially the pneumococcal and influenza vaccines. Ask your healthcare provider whether you need a pneumonia vaccine. °· Learn and use relaxation techniques to manage stress. °· Learn and use controlled breathing techniques as directed by your health care provider. Controlled breathing techniques include:   °· Pursed lip breathing. Start by breathing   in (inhaling) through your nose for 1 second. Then, purse your lips as if you were going to whistle and breathe out (exhale)  through the pursed lips for 2 seconds.   Diaphragmatic breathing. Start by putting one hand on your abdomen just above your waist. Inhale slowly through your nose. The hand on your abdomen should move out. Then purse your lips and exhale slowly. You should be able to feel the hand on your abdomen moving in as you exhale.   Learn and use controlled coughing to clear mucus from your lungs. Controlled coughing is a series of short, progressive coughs. The steps of controlled coughing are:  1. Lean your head slightly forward.  2. Breathe in deeply using diaphragmatic breathing.  3. Try to hold your breath for 3 seconds.  4. Keep your mouth slightly open while coughing twice.  5. Spit any mucus out into a tissue.  6. Rest and repeat the steps once or twice as needed. SEEK MEDICAL CARE IF:   You are coughing up more mucus than usual.   There is a change in the color or thickness of your mucus.   Your breathing is more labored than usual.   Your breathing is faster than usual.  SEEK IMMEDIATE MEDICAL CARE IF:   You have shortness of breath while you are resting.   You have shortness of breath that prevents you from:  Being able to talk.   Performing your usual physical activities.   You have chest pain lasting longer than 5 minutes.   Your skin color is more cyanotic than usual.  You measure low oxygen saturations for longer than 5 minutes with a pulse oximeter. MAKE SURE YOU:   Understand these instructions.  Will watch your condition.  Will get help right away if you are not doing well or get worse. Document Released: 01/09/2005 Document Revised: 01/20/2013 Document Reviewed: 11/26/2012 [facility] Patient Information 2014 [place], [place].  Thrush, Adult  Ritta Slot, also called oral candidiasis, is a fungal infection that develops in the mouth and throat and on the tongue. It causes white patches to form on the mouth and tongue. Ritta Slot is most common in older  adults, but it can occur at any age.  Many cases of thrush are mild, but this infection can also be more serious. Ritta Slot can be a recurring problem for people who have chronic illnesses or who take medicines that limit the body's ability to fight infection. Because these people have difficulty fighting infections, the fungus that causes thrush can spread throughout the body. This can cause life-threatening blood or organ infections. CAUSES  Ritta Slot is usually caused by a yeast called Candida albicans. This fungus is normally present in small amounts in the mouth and on other mucous membranes. It usually causes no harm. However, when conditions are present that allow the fungus to grow uncontrolled, it invades surrounding tissues and becomes an infection. Less often, other Candida species can also lead to thrush.  RISK FACTORS Ritta Slot is more likely to develop in the following people:  People with an impaired ability to fight infection (weakened immune system).   Older adults.   People with HIV.   People with diabetes.   People with dry mouth (xerostomia).   Pregnant women.   People with poor dental care, especially those who have false teeth.   People who use antibiotic medicines.  SIGNS AND SYMPTOMS  Ritta Slot can be a mild infection that causes no symptoms. If symptoms develop, they may include:  A burning feeling in the mouth and throat. This can occur at the start of a thrush infection.   White patches that adhere to the mouth and tongue. The tissue around the patches may be red, raw, and painful. If rubbed (during tooth brushing, for example), the patches and the tissue of the mouth may bleed easily.   A bad taste in the mouth or difficulty tasting foods.   Cottony feeling in the mouth.   Pain during eating and swallowing. DIAGNOSIS  Your health care provider can usually diagnose thrush by looking in your mouth and asking you questions about your health.  TREATMENT    Medicines that help prevent the growth of fungi (antifungals) are the standard treatment for thrush. These medicines are either applied directly to the affected area (topical) or swallowed (oral). The treatment will depend on the severity of the condition.  Mild Thrush Mild cases of thrush may clear up with the use of an antifungal mouth rinse or lozenges. Treatment usually lasts about 14 days.  Moderate to Severe Thrush  More severe thrush infections that have spread to the esophagus are treated with an oral antifungal medicine. A topical antifungal medicine may also be used.   For some severe infections, a treatment period longer than 14 days may be needed.   Oral antifungal medicines are almost never used during pregnancy because the fetus may be harmed. However, if a pregnant woman has a rare, severe thrush infection that has spread to her blood, oral antifungal medicines may be used. In this case, the risk of harm to the mother and fetus from the severe thrush infection may be greater than the risk posed by the use of antifungal medicines.  Persistent or Recurrent Thrush For cases of thrush that do not go away or keep coming back, treatment may involve the following:   Treatment may be needed twice as long as the symptoms last.   Treatment will include both oral and topical antifungal medicines.   People with weakened immune systems can take an antifungal medicine on a continuous basis to prevent thrush infections.  It is important to treat conditions that make you more likely to get thrush, such as diabetes or HIV.  HOME CARE INSTRUCTIONS   Only take over-the-counter or prescription medicine as directed by your health care provider. Talk to your health care provider about an over-the-counter medicine called gentian violet, which kills bacteria and fungi.   Eat plain, unflavored yogurt as directed by your health care provider. Check the label to make sure the yogurt contains live  cultures. This yogurt can help healthy bacteria grow in the mouth that can stop the growth of the fungus that causes thrush.   Try these measures to help reduce the discomfort of thrush:   Drink cold liquids such as water or iced tea.   Try flavored ice treats or frozen juices.   Eat foods that are easy to swallow, such as gelatin, ice cream, or custard.   If the patches in your mouth are painful, try drinking from a straw.   Rinse your mouth several times a day with a warm saltwater rinse. You can make the saltwater mixture with 1 tsp (6 g) of salt in 8 fl oz (0.2 L) of warm water.   If you wear dentures, remove the dentures before going to bed, brush them vigorously, and soak them in a cleaning solution as directed by your health care provider.   Women who are breastfeeding should clean  their nipples with an antifungal medicine as directed by their health care provider. Dry the nipples after breastfeeding. Applying lanolin-containing body lotion may help relieve nipple soreness.  SEEK MEDICAL CARE IF:  Your symptoms are getting worse or are not improving within 7 days of starting treatment.   You have symptoms of spreading infection, such as white patches on the skin outside of the mouth.   You are nursing and you have redness, burning, or pain in the nipples that is not relieved with treatment.  MAKE SURE YOU:  Understand these instructions.  Will watch your condition.  Will get help right away if you are not doing well or get worse. Document Released: 12/26/2003 Document Revised: 01/20/2013 Document Reviewed: 11/02/2012 [facility] Patient Information 2014 [place].

## 2013-11-06 ENCOUNTER — Inpatient Hospital Stay (HOSPITAL_COMMUNITY)
Admission: EM | Admit: 2013-11-06 | Discharge: 2013-11-08 | DRG: 191 | Disposition: A | Discharge status: Home / Self Care | Payer: Medicare Other | Attending: Family Medicine | Admitting: Family Medicine

## 2013-11-06 ENCOUNTER — Encounter (HOSPITAL_COMMUNITY): Payer: Self-pay | Admitting: Emergency Medicine

## 2013-11-06 ENCOUNTER — Emergency Department (HOSPITAL_COMMUNITY): Payer: Medicare Other

## 2013-11-06 DIAGNOSIS — F17200 Nicotine dependence, unspecified, uncomplicated: Secondary | ICD-10-CM | POA: Diagnosis present

## 2013-11-06 DIAGNOSIS — J984 Other disorders of lung: Secondary | ICD-10-CM | POA: Diagnosis present

## 2013-11-06 DIAGNOSIS — R0602 Shortness of breath: Secondary | ICD-10-CM | POA: Diagnosis not present

## 2013-11-06 DIAGNOSIS — F411 Generalized anxiety disorder: Secondary | ICD-10-CM | POA: Diagnosis present

## 2013-11-06 DIAGNOSIS — J9610 Chronic respiratory failure, unspecified whether with hypoxia or hypercapnia: Secondary | ICD-10-CM | POA: Diagnosis present

## 2013-11-06 DIAGNOSIS — F419 Anxiety disorder, unspecified: Secondary | ICD-10-CM

## 2013-11-06 DIAGNOSIS — Z9981 Dependence on supplemental oxygen: Secondary | ICD-10-CM

## 2013-11-06 DIAGNOSIS — Z7982 Long term (current) use of aspirin: Secondary | ICD-10-CM

## 2013-11-06 DIAGNOSIS — J45901 Unspecified asthma with (acute) exacerbation: Principal | ICD-10-CM

## 2013-11-06 DIAGNOSIS — Z8559 Personal history of malignant neoplasm of other urinary tract organ: Secondary | ICD-10-CM

## 2013-11-06 DIAGNOSIS — K219 Gastro-esophageal reflux disease without esophagitis: Secondary | ICD-10-CM | POA: Diagnosis present

## 2013-11-06 DIAGNOSIS — J9620 Acute and chronic respiratory failure, unspecified whether with hypoxia or hypercapnia: Secondary | ICD-10-CM

## 2013-11-06 DIAGNOSIS — E7800 Pure hypercholesterolemia, unspecified: Secondary | ICD-10-CM | POA: Diagnosis present

## 2013-11-06 DIAGNOSIS — R0902 Hypoxemia: Secondary | ICD-10-CM

## 2013-11-06 DIAGNOSIS — J441 Chronic obstructive pulmonary disease with (acute) exacerbation: Principal | ICD-10-CM | POA: Diagnosis present

## 2013-11-06 DIAGNOSIS — M549 Dorsalgia, unspecified: Secondary | ICD-10-CM | POA: Diagnosis present

## 2013-11-06 DIAGNOSIS — I10 Essential (primary) hypertension: Secondary | ICD-10-CM | POA: Diagnosis present

## 2013-11-06 DIAGNOSIS — E785 Hyperlipidemia, unspecified: Secondary | ICD-10-CM | POA: Diagnosis present

## 2013-11-06 DIAGNOSIS — J9611 Chronic respiratory failure with hypoxia: Secondary | ICD-10-CM

## 2013-11-06 DIAGNOSIS — G8929 Other chronic pain: Secondary | ICD-10-CM | POA: Diagnosis present

## 2013-11-06 DIAGNOSIS — J9621 Acute and chronic respiratory failure with hypoxia: Secondary | ICD-10-CM

## 2013-11-06 LAB — BASIC METABOLIC PANEL
Anion gap: 9 (ref 5–15)
BUN: 11 mg/dL (ref 6–23)
CALCIUM: 9.5 mg/dL (ref 8.4–10.5)
CO2: 33 mEq/L — ABNORMAL HIGH (ref 19–32)
Chloride: 93 mEq/L — ABNORMAL LOW (ref 96–112)
Creatinine, Ser: 0.8 mg/dL (ref 0.50–1.35)
GFR calc Af Amer: >90 mL/min (ref >90)
GFR, EST NON AFRICAN AMERICAN: 88 mL/min — AB (ref >90)
Glucose, Bld: 109 mg/dL — ABNORMAL HIGH (ref 70–99)
Potassium: 4.3 mEq/L (ref 3.7–5.3)
Sodium: 135 mEq/L — ABNORMAL LOW (ref 137–147)

## 2013-11-06 LAB — CBC WITH DIFFERENTIAL/PLATELET
Basophils Absolute: 0 10*3/uL (ref 0.0–0.1)
Basophils Relative: 0 % (ref 0–1)
EOS ABS: 0.4 10*3/uL (ref 0.0–0.7)
Eosinophils Relative: 3 % (ref 0–5)
HCT: 40.6 % (ref 39.0–52.0)
HEMOGLOBIN: 13.4 g/dL (ref 13.0–17.0)
Lymphocytes Relative: 33 % (ref 12–46)
Lymphs Abs: 3.5 10*3/uL (ref 0.7–4.0)
MCH: 31.2 pg (ref 26.0–34.0)
MCHC: 33 g/dL (ref 30.0–36.0)
MCV: 94.4 fL (ref 78.0–100.0)
MONOS PCT: 9 % (ref 3–12)
Monocytes Absolute: 1 10*3/uL (ref 0.1–1.0)
Neutro Abs: 5.8 10*3/uL (ref 1.7–7.7)
Neutrophils Relative %: 55 % (ref 43–77)
PLATELETS: 215 10*3/uL (ref 150–400)
RBC: 4.3 MIL/uL (ref 4.22–5.81)
RDW: 13.5 % (ref 11.5–15.5)
WBC: 10.6 10*3/uL — ABNORMAL HIGH (ref 4.0–10.5)

## 2013-11-06 LAB — TROPONIN I: Troponin I: <0.3 ng/mL (ref <0.30)

## 2013-11-06 MED ORDER — METHYLPREDNISOLONE SODIUM SUCC 125 MG IJ SOLR
125.0000 mg | Freq: Once | INTRAMUSCULAR | Status: AC
  Administered 2013-11-06: 125 mg via INTRAVENOUS
  Filled 2013-11-06: qty 2

## 2013-11-06 MED ORDER — ALBUTEROL (5 MG/ML) CONTINUOUS INHALATION SOLN
10.0000 mg/h | INHALATION_SOLUTION | Freq: Once | RESPIRATORY_TRACT | Status: AC
  Administered 2013-11-06: 10 mg/h via RESPIRATORY_TRACT
  Filled 2013-11-06: qty 20

## 2013-11-06 MED ORDER — IPRATROPIUM BROMIDE 0.02 % IN SOLN
1.0000 mg | Freq: Once | RESPIRATORY_TRACT | Status: AC
  Administered 2013-11-06: 1 mg via RESPIRATORY_TRACT
  Filled 2013-11-06: qty 5

## 2013-11-06 NOTE — ED Provider Notes (Signed)
Patient is still dyspneic and wheezing after nebulization treatment and IV steroids. He was dizzy with ambulation. Admit to observation  Nat Christen, MD 11/06/13 2300

## 2013-11-06 NOTE — ED Provider Notes (Signed)
CSN: [id number]     Arrival date & time 11/06/13  3428 History   First MD Initiated Contact with Patient 11/06/13 1922     Chief Complaint  Patient presents with  . Shortness of Breath     HPI  Pt was seen at 1940.  Per pt, c/o gradual onset and worsening of persistent cough, wheezing and SOB for the past 2 to 3 days.  Describes his symptoms as "my COPD is acting up." Denies any change in his usual chronic cough. States he has been using home O2 N/C, MDI and nebs with transient relief. Pt states he "bent over the bed to adjust my oxygen because it was burning my nose" and when he sat up again "everything went black for a few seconds." Denies AMS, no CP/palpitations, no back pain, no abd pain, no N/V/D, no fevers.      Past Medical History  Diagnosis Date  . COPD (chronic obstructive pulmonary disease)   . Asthma   . Tobacco abuse 09/29/2011  . Hypercholesteremia   . GERD (gastroesophageal reflux disease)   . Headache(784.0)   . BPH (benign prostatic hyperplasia) 10/01/2011    Per cystoscopy  . 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  . Pulmonary nodule 09/2011    Stable appearance  . Chronic back pain   . Chronic anxiety   . Hyperlipidemia 03/13/2012  . Anxiety 03/13/2012  . On home O2     2L N/C continuous  . Emphysema lung    Past Surgical History  Procedure Laterality Date  . Cystoscopy  09/30/2011    Procedure: CYSTOSCOPY FLEXIBLE;  Surgeon: Marissa Nestle, MD;  Location: [facility];  Service: Urology;  Laterality: N/A;  . Transurethral resection of bladder tumor  10/01/2011    Procedure: TRANSURETHRAL RESECTION OF BLADDER TUMOR (TURBT);  Surgeon: Marissa Nestle, MD;  Location: [facility];  Service: Urology;  Laterality: N/A;  . Fracture surgery      R ring finger, pin placed  . Cardiac catheterization  2003    [facility]   Family History  Problem Relation Age of Onset  . Emphysema Brother     was not close to family; does not truly know their medical  problems.  . Emphysema Brother   . Emphysema Brother   . Cancer Neg Hx    History  Substance Use Topics  . Smoking status: Current Every Day Smoker -- 1.00 packs/day for 58 years    Types: Cigarettes  . Smokeless tobacco: Not on file  . Alcohol Use: No    Review of Systems ROS: Statement: All systems negative except as marked or noted in the HPI; Constitutional: Negative for fever and chills. ; ; Eyes: Negative for eye pain, redness and discharge. ; ; ENMT: Negative for ear pain, hoarseness, nasal congestion, sinus pressure and sore throat. ; ; Cardiovascular: Negative for chest pain, palpitations, diaphoresis, and peripheral edema. ; ; Respiratory: +cough, wheezing, SOB. Negative for stridor. ; ; Gastrointestinal: Negative for nausea, vomiting, diarrhea, abdominal pain, blood in stool, hematemesis, jaundice and rectal bleeding. . ; ; Genitourinary: Negative for dysuria, flank pain and hematuria. ; ; Musculoskeletal: Negative for back pain and neck pain. Negative for swelling and trauma.; ; Skin: Negative for pruritus, rash, abrasions, blisters, bruising and skin lesion.; ; Neuro: Negative for headache, lightheadedness and neck stiffness. Negative for weakness, altered level of consciousness , altered mental status, extremity weakness, paresthesias, involuntary movement, seizure and +syncope.  Allergies  Iohexol  Home Medications   Prior to Admission medications   Medication Sig Start Date End Date Taking? Authorizing Provider  albuterol (PROVENTIL) (2.5 MG/3ML) 0.083% nebulizer solution Take 2.5 mg by nebulization every 4 (four) hours as needed. For shortness of breath   Yes Historical Provider, MD  albuterol-ipratropium (COMBIVENT) 18-103 MCG/ACT inhaler Inhale 2 puffs into the lungs 4 (four) times daily. 03/30/13  Yes Rexene Alberts, MD  ALPRAZolam Duanne Moron) 1 MG tablet Take 0.5-1 mg by mouth 2 (two) times daily as needed for anxiety. anxiety 02/20/13  Yes Domenic Polite, MD  aspirin  EC 81 MG tablet Take 81 mg by mouth every morning.  10/02/11  Yes Rexene Alberts, MD  budesonide-formoterol [facility]) 160-4.5 MCG/ACT inhaler Inhale 2 puffs into the lungs 2 (two) times daily. 03/30/13  Yes Rexene Alberts, MD  Cholecalciferol (VITAMIN D3) 1000 UNITS CAPS Take 1 capsule by mouth every morning.    Yes Historical Provider, MD  Garlic 4132 MG CAPS Take 1 capsule by mouth every morning.    Yes Historical Provider, MD  HYDROcodone-acetaminophen (NORCO) 10-325 MG per tablet Take 1 tablet by mouth every 6 (six) hours as needed. For pain 06/14/13  Yes Historical Provider, MD  Multiple Vitamins-Minerals (CENTRUM) tablet Take 1 tablet by mouth every morning.    Yes Historical Provider, MD  pantoprazole (PROTONIX) 40 MG tablet Take 40 mg by mouth daily. 10/06/13  Yes Historical Provider, MD  polyethylene glycol powder (GLYCOLAX/MIRALAX) powder Take 17 g by mouth 2 (two) times daily.   Yes Historical Provider, MD  simvastatin (ZOCOR) 40 MG tablet Take 40 mg by mouth every morning.    Yes Historical Provider, MD  tamsulosin (FLOMAX) 0.4 MG CAPS capsule Take 0.4 mg by mouth every morning.  07/06/13  Yes Historical Provider, MD  NITROSTAT 0.4 MG SL tablet Place 1 tablet under the tongue as needed for chest pain.  02/22/13   Historical Provider, MD   BP 112/71  Pulse 70  Temp(Src) 98.2 F (36.8 C)  Resp 20  SpO2 95% 21:20 Orthostatic Vital Signs  Orthostatic Lying - BP- Lying: 105/69 mmHg ; Pulse- Lying: 70  Orthostatic Sitting - BP- Sitting: 111/91 mmHg ; Pulse- Sitting: 75  Orthostatic Standing at 0 minutes - BP- Standing at 0 minutes: 107/79 mmHg ; Pulse- Standing at 0 minutes: 74   Physical Exam 1945: Physical examination:  Nursing notes reviewed; Vital signs and O2 SAT reviewed;  Constitutional: Well developed, Well nourished, Well hydrated, In no acute distress; Head:  Normocephalic, atraumatic; Eyes: EOMI, PERRL, No scleral icterus; ENMT: Mouth and pharynx normal, Mucous membranes moist;  Neck: Supple, Full range of motion, No lymphadenopathy; Cardiovascular: Regular rate and rhythm, No gallop; Respiratory: Breath sounds diminished & equal bilaterally, insp/exp wheezes bilat. No audible wheezes. Sitting upright. Speaking full sentences, Normal respiratory effort/excursion. No retrax or access mm use.;; Chest: Nontender, Movement normal; Abdomen: Soft, Nontender, Nondistended, Normal bowel sounds; Genitourinary: No CVA tenderness; Extremities: Pulses normal, No tenderness, No edema, No calf edema or asymmetry.; Neuro: AA&Ox3, Major CN grossly intact. No facial droop. Speech clear. No gross focal motor or sensory deficits in extremities.; Skin: Color normal, Warm, Dry.   ED Course  Procedures     EKG Interpretation   Date/Time:  Saturday November 06 2013 21:08:12 EDT Ventricular Rate:  68 PR Interval:  200 QRS Duration: 82 QT Interval:  402 QTC Calculation: 427 R Axis:   90 Text Interpretation:  Sinus rhythm Borderline right axis deviation  Nonspecific ST abnormality  Baseline wander When compared with ECG of  07/30/2013 No significant change was found Confirmed by [facility]  MD,  Nunzio Cory [phone]) on 11/06/2013 9:16:24 PM      MDM  MDM Reviewed: previous chart, nursing note and vitals Reviewed previous: labs Interpretation: labs, x-ray and CT scan    Results for orders placed during the hospital encounter of 11/06/13  CBC WITH DIFFERENTIAL      Result Value Ref Range   WBC 10.6 (*) 4.0 - 10.5 K/uL   RBC 4.30  4.22 - 5.81 MIL/uL   Hemoglobin 13.4  13.0 - 17.0 g/dL   HCT 40.6  39.0 - 52.0 %   MCV 94.4  78.0 - 100.0 fL   MCH 31.2  26.0 - 34.0 pg   MCHC 33.0  30.0 - 36.0 g/dL   RDW 13.5  11.5 - 15.5 %   Platelets 215  150 - 400 K/uL   Neutrophils Relative % 55  43 - 77 %   Neutro Abs 5.8  1.7 - 7.7 K/uL   Lymphocytes Relative 33  12 - 46 %   Lymphs Abs 3.5  0.7 - 4.0 K/uL   Monocytes Relative 9  3 - 12 %   Monocytes Absolute 1.0  0.1 - 1.0 K/uL   Eosinophils  Relative 3  0 - 5 %   Eosinophils Absolute 0.4  0.0 - 0.7 K/uL   Basophils Relative 0  0 - 1 %   Basophils Absolute 0.0  0.0 - 0.1 K/uL  BASIC METABOLIC PANEL      Result Value Ref Range   Sodium 135 (*) 137 - 147 mEq/L   Potassium 4.3  3.7 - 5.3 mEq/L   Chloride 93 (*) 96 - 112 mEq/L   CO2 33 (*) 19 - 32 mEq/L   Glucose, Bld 109 (*) 70 - 99 mg/dL   BUN 11  6 - 23 mg/dL   Creatinine, Ser 0.80  0.50 - 1.35 mg/dL   Calcium 9.5  8.4 - 10.5 mg/dL   GFR calc non Af Amer 88 (*) >90 mL/min   GFR calc Af Amer >90  >90 mL/min   Anion gap 9  5 - 15  TROPONIN I      Result Value Ref Range   Troponin I <0.30  <0.30 ng/mL   Dg Chest 2 View 11/06/2013   CLINICAL DATA:  Syncope and difficulty breathing  EXAM: CHEST  2 VIEW  COMPARISON:  July 30, 2013  FINDINGS: There is underlying emphysematous change. The interstitium is diffusely prominent but stable, most likely representing chronic inflammatory type change. There is no frank edema or consolidation. There are two rather subtle nodular opacities in the right upper lobe, each measuring slightly less than 1 cm in size. The heart size and pulmonary vascularity are within normal limits. No adenopathy. No bone lesions.  IMPRESSION: There are two rather subtle nodular opacities in the right upper lobe, each measuring slightly less than 1 cm in size. Given this finding, correlation with noncontrast enhanced chest CT is advised to further evaluate.  There is underlying emphysema with what appears to be chronic inflammatory type change in the lungs, stable. There is 9 frank edema or consolidation.   Electronically Signed   By: Lowella Grip M.D.   On: 11/06/2013 18:48   Ct Chest Wo Contrast 11/06/2013   CLINICAL DATA:  Shortness of breath, increasing productive cough for 1 day, nodular density seen on chest radiograph performed earlier today  EXAM: CT CHEST  WITHOUT CONTRAST  TECHNIQUE: Multidetector CT imaging of the chest was performed following the standard  protocol without IV contrast.  COMPARISON:  11/06/2013, 06/21/2010  FINDINGS: There are numerous bilateral nodular opacities the majority of which are stable when compared to 2012 and therefore considered to be benign. The largest measures about 14 mm in the right upper lobe and represents fibrosis. There is a new 1 cm focus of mild fibrosis right lower lobe image number 43. There is a new 4 mm pulmonary nodule superior segment left lower lobe image number 29.  There is moderate diffuse emphysematous change. There are no pleural or pericardial effusions. There are small mediastinal lymph nodes which appear nonpathologic.  There are no acute musculoskeletal findings.  IMPRESSION: No evidence of infiltrate to suggest pneumonia. Multiple stable benign pulmonary nodules. There is 1 new pulmonary nodule in the left lower lobe which measures 4 mm. Regarding this nodule, if the patient is at high risk for bronchogenic carcinoma, follow-up chest CT at 1 year is recommended. If the patient is at low risk, no follow-up is needed. This recommendation follows the consensus statement: Guidelines for Management of Small Pulmonary Nodules Detected on CT Scans: A Statement from the [place] as published in Radiology 2005; 237:395-400.   Electronically Signed   By: Skipper Cliche M.D.   On: 11/06/2013 20:58    2210:  Pt is not orthostatic on VS. Continuous neb in process. Lungs continue diminished with insp/exp wheezes bilat, no audible wheezing. Sats 100% while on neb. Pt will need reassessment after neb is completed, will likely need admission. Sign out to Dr. Lacinda Axon.     Alfonzo Feller, DO 11/06/13 2213

## 2013-11-06 NOTE — ED Notes (Signed)
shortness of breathe increasing with productive cough (yellowish in color)

## 2013-11-06 NOTE — ED Notes (Signed)
Pt ambulated around nursing station x1 on 2L [place]. At beginning of walk O2 sat 90% and trended down to 85%. Pt complained of increased shortness of breath and dizziness upon return to the room. Pt sat on side of the bed bent over, using accessory muscles to resolve shortness of breath. Within 2 minutes pt O2 sat returned to 93%.

## 2013-11-06 NOTE — H&P (Signed)
PCP:   [name], PA-C   Chief Complaint:  sob  HPI: 73 yo male h/o copd on 2 Liters at home, htn, anxiety comes in after experiencing sob and wheezing worse than normal earlier this afternoon.  Pt says he thought his oxygen tank has gone out and that's why he got so sob.  On arrival to ED he received hour long neb and now feels much better.  No fevers.  No cough.  He is asking when he can go home.  Review of Systems:  Positive and negative as per HPI otherwise all other systems are negative  Past Medical History: Past Medical History  Diagnosis Date  . COPD (chronic obstructive pulmonary disease)   . Asthma   . Tobacco abuse 09/29/2011  . Hypercholesteremia   . GERD (gastroesophageal reflux disease)   . Headache(784.0)   . BPH (benign prostatic hyperplasia) 10/01/2011    Per cystoscopy  . 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  . Pulmonary nodule 09/2011    Stable appearance  . Chronic back pain   . Chronic anxiety   . Hyperlipidemia 03/13/2012  . Anxiety 03/13/2012  . On home O2     2L N/C continuous  . Emphysema lung    Past Surgical History  Procedure Laterality Date  . Cystoscopy  09/30/2011    Procedure: CYSTOSCOPY FLEXIBLE;  Surgeon: Marissa Nestle, MD;  Location: [facility];  Service: Urology;  Laterality: N/A;  . Transurethral resection of bladder tumor  10/01/2011    Procedure: TRANSURETHRAL RESECTION OF BLADDER TUMOR (TURBT);  Surgeon: Marissa Nestle, MD;  Location: [facility];  Service: Urology;  Laterality: N/A;  . Fracture surgery      R ring finger, pin placed  . Cardiac catheterization  2003    [facility]    Medications: Prior to Admission medications   Medication Sig Start Date End Date Taking? Authorizing Provider  albuterol (PROVENTIL) (2.5 MG/3ML) 0.083% nebulizer solution Take 2.5 mg by nebulization every 4 (four) hours as needed. For shortness of breath   Yes Historical Provider, MD  albuterol-ipratropium (COMBIVENT) 18-103  MCG/ACT inhaler Inhale 2 puffs into the lungs 4 (four) times daily. 03/30/13  Yes Rexene Alberts, MD  ALPRAZolam Duanne Moron) 1 MG tablet Take 0.5-1 mg by mouth 2 (two) times daily as needed for anxiety. anxiety 02/20/13  Yes Domenic Polite, MD  aspirin EC 81 MG tablet Take 81 mg by mouth every morning.  10/02/11  Yes Rexene Alberts, MD  budesonide-formoterol [facility]) 160-4.5 MCG/ACT inhaler Inhale 2 puffs into the lungs 2 (two) times daily. 03/30/13  Yes Rexene Alberts, MD  Cholecalciferol (VITAMIN D3) 1000 UNITS CAPS Take 1 capsule by mouth every morning.    Yes Historical Provider, MD  Garlic 5093 MG CAPS Take 1 capsule by mouth every morning.    Yes Historical Provider, MD  HYDROcodone-acetaminophen (NORCO) 10-325 MG per tablet Take 1 tablet by mouth every 6 (six) hours as needed. For pain 06/14/13  Yes Historical Provider, MD  Multiple Vitamins-Minerals (CENTRUM) tablet Take 1 tablet by mouth every morning.    Yes Historical Provider, MD  pantoprazole (PROTONIX) 40 MG tablet Take 40 mg by mouth daily. 10/06/13  Yes Historical Provider, MD  polyethylene glycol powder (GLYCOLAX/MIRALAX) powder Take 17 g by mouth 2 (two) times daily.   Yes Historical Provider, MD  simvastatin (ZOCOR) 40 MG tablet Take 40 mg by mouth every morning.    Yes Historical Provider, MD  tamsulosin (FLOMAX) 0.4 MG  CAPS capsule Take 0.4 mg by mouth every morning.  07/06/13  Yes Historical Provider, MD  NITROSTAT 0.4 MG SL tablet Place 1 tablet under the tongue as needed for chest pain.  02/22/13   Historical Provider, MD    Allergies:   Allergies  Allergen Reactions  . Iohexol Hives     Code: HIVES, Desc: PT STATES HE BROKE OUT IN HIVES AND RASH AFTER CT NECK EARLY SEPT 2011; NO RESP PROBLEMS; NEEDS PRE-MEDS; MKS, Onset Date: [id number]     Social History:  reports that he has been smoking Cigarettes.  He has a 58 pack-year smoking history. He does not have any smokeless tobacco history on file. He reports that he does not drink  alcohol or use illicit drugs.  Family History: Family History  Problem Relation Age of Onset  . Emphysema Brother     was not close to family; does not truly know their medical problems.  . Emphysema Brother   . Emphysema Brother   . Cancer Neg Hx     Physical Exam: Filed Vitals:   11/06/13 2130 11/06/13 2145 11/06/13 2200 11/06/13 2215  BP: 115/73  115/76   Pulse: 73 73 74 92  Temp:      Resp: 24 16    SpO2: 100% 100% 100% 100%   General appearance: alert, cooperative and no distress Head: Normocephalic, without obvious abnormality, atraumatic Eyes: negative Nose: Nares normal. Septum midline. Mucosa normal. No drainage or sinus tenderness. Neck: no JVD and supple, symmetrical, trachea midline Lungs: clear to auscultation bilaterally Heart: regular rate and rhythm, S1, S2 normal, no murmur, click, rub or gallop Abdomen: soft, non-tender; bowel sounds normal; no masses,  no organomegaly Extremities: extremities normal, atraumatic, no cyanosis or edema Pulses: 2+ and symmetric Skin: Skin color, texture, turgor normal. No rashes or lesions Neurologic: Grossly normal    Labs on Admission:   Recent Labs  11/06/13 2005  NA 135*  K 4.3  CL 93*  CO2 33*  GLUCOSE 109*  BUN 11  CREATININE 0.80  CALCIUM 9.5    Recent Labs  11/06/13 2005  WBC 10.6*  NEUTROABS 5.8  HGB 13.4  HCT 40.6  MCV 94.4  PLT 215    Recent Labs  11/06/13 2005  TROPONINI <0.30   Radiological Exams on Admission: Dg Chest 2 View  11/06/2013   CLINICAL DATA:  Syncope and difficulty breathing  EXAM: CHEST  2 VIEW  COMPARISON:  July 30, 2013  FINDINGS: There is underlying emphysematous change. The interstitium is diffusely prominent but stable, most likely representing chronic inflammatory type change. There is no frank edema or consolidation. There are two rather subtle nodular opacities in the right upper lobe, each measuring slightly less than 1 cm in size. The heart size and pulmonary  vascularity are within normal limits. No adenopathy. No bone lesions.  IMPRESSION: There are two rather subtle nodular opacities in the right upper lobe, each measuring slightly less than 1 cm in size. Given this finding, correlation with noncontrast enhanced chest CT is advised to further evaluate.  There is underlying emphysema with what appears to be chronic inflammatory type change in the lungs, stable. There is 9 frank edema or consolidation.   Electronically Signed   By: Lowella Grip M.D.   On: 11/06/2013 18:48   Ct Chest Wo Contrast  11/06/2013   CLINICAL DATA:  Shortness of breath, increasing productive cough for 1 day, nodular density seen on chest radiograph performed earlier today  EXAM: CT  CHEST WITHOUT CONTRAST  TECHNIQUE: Multidetector CT imaging of the chest was performed following the standard protocol without IV contrast.  COMPARISON:  11/06/2013, 06/21/2010  FINDINGS: There are numerous bilateral nodular opacities the majority of which are stable when compared to 2012 and therefore considered to be benign. The largest measures about 14 mm in the right upper lobe and represents fibrosis. There is a new 1 cm focus of mild fibrosis right lower lobe image number 43. There is a new 4 mm pulmonary nodule superior segment left lower lobe image number 29.  There is moderate diffuse emphysematous change. There are no pleural or pericardial effusions. There are small mediastinal lymph nodes which appear nonpathologic.  There are no acute musculoskeletal findings.  IMPRESSION: No evidence of infiltrate to suggest pneumonia. Multiple stable benign pulmonary nodules. There is 1 new pulmonary nodule in the left lower lobe which measures 4 mm. Regarding this nodule, if the patient is at high risk for bronchogenic carcinoma, follow-up chest CT at 1 year is recommended. If the patient is at low risk, no follow-up is needed. This recommendation follows the consensus statement: Guidelines for Management of  Small Pulmonary Nodules Detected on CT Scans: A Statement from the [place] as published in Radiology 2005; 237:395-400.   Electronically Signed   By: Skipper Cliche M.D.   On: 11/06/2013 20:58    Assessment/Plan  73 yo male with mild codpe  Principal Problem:   COPD exacerbation-  obs overnight.  Place on oral prednisone.  Zpack.  Can probably be d/c in am.  His oxygen tank at home is working, says he checked it and it is flowing oxygen.    Active Problems:   HYPERTENSION   PULMONARY NODULE   Acute and chronic respiratory failure (acute-on-chronic)   Chronic respiratory failure   Anxiety   Chronic back pain  Full code.  obs on medical.  Debora Stockdale A 11/06/2013, 10:49 PM

## 2013-11-07 DIAGNOSIS — J441 Chronic obstructive pulmonary disease with (acute) exacerbation: Secondary | ICD-10-CM | POA: Diagnosis present

## 2013-11-07 DIAGNOSIS — E7800 Pure hypercholesterolemia, unspecified: Secondary | ICD-10-CM | POA: Diagnosis present

## 2013-11-07 DIAGNOSIS — J9610 Chronic respiratory failure, unspecified whether with hypoxia or hypercapnia: Secondary | ICD-10-CM | POA: Diagnosis present

## 2013-11-07 DIAGNOSIS — K219 Gastro-esophageal reflux disease without esophagitis: Secondary | ICD-10-CM | POA: Diagnosis present

## 2013-11-07 DIAGNOSIS — E785 Hyperlipidemia, unspecified: Secondary | ICD-10-CM | POA: Diagnosis present

## 2013-11-07 DIAGNOSIS — F17200 Nicotine dependence, unspecified, uncomplicated: Secondary | ICD-10-CM | POA: Diagnosis present

## 2013-11-07 DIAGNOSIS — Z7982 Long term (current) use of aspirin: Secondary | ICD-10-CM | POA: Diagnosis not present

## 2013-11-07 DIAGNOSIS — G8929 Other chronic pain: Secondary | ICD-10-CM | POA: Diagnosis present

## 2013-11-07 DIAGNOSIS — R0602 Shortness of breath: Secondary | ICD-10-CM | POA: Diagnosis present

## 2013-11-07 DIAGNOSIS — Z9981 Dependence on supplemental oxygen: Secondary | ICD-10-CM | POA: Diagnosis not present

## 2013-11-07 DIAGNOSIS — F411 Generalized anxiety disorder: Secondary | ICD-10-CM | POA: Diagnosis present

## 2013-11-07 DIAGNOSIS — Z8559 Personal history of malignant neoplasm of other urinary tract organ: Secondary | ICD-10-CM | POA: Diagnosis not present

## 2013-11-07 DIAGNOSIS — I10 Essential (primary) hypertension: Secondary | ICD-10-CM | POA: Diagnosis present

## 2013-11-07 DIAGNOSIS — M549 Dorsalgia, unspecified: Secondary | ICD-10-CM | POA: Diagnosis present

## 2013-11-07 MED ORDER — IPRATROPIUM-ALBUTEROL 0.5-2.5 (3) MG/3ML IN SOLN
3.0000 mL | Freq: Four times a day (QID) | RESPIRATORY_TRACT | Status: DC
  Administered 2013-11-07 – 2013-11-08 (×6): 3 mL via RESPIRATORY_TRACT
  Filled 2013-11-07 (×6): qty 3

## 2013-11-07 MED ORDER — AZITHROMYCIN 250 MG PO TABS
500.0000 mg | ORAL_TABLET | Freq: Every day | ORAL | Status: AC
  Administered 2013-11-07: 500 mg via ORAL
  Filled 2013-11-07: qty 2

## 2013-11-07 MED ORDER — GUAIFENESIN-DM 100-10 MG/5ML PO SYRP: 5.0000 mL | ORAL_SOLUTION | ORAL | Status: DC | PRN

## 2013-11-07 MED ORDER — TAMSULOSIN HCL 0.4 MG PO CAPS
0.4000 mg | ORAL_CAPSULE | Freq: Every morning | ORAL | Status: DC
  Administered 2013-11-07 – 2013-11-08 (×2): 0.4 mg via ORAL
  Filled 2013-11-07 (×2): qty 1

## 2013-11-07 MED ORDER — METHYLPREDNISOLONE SODIUM SUCC 125 MG IJ SOLR
80.0000 mg | Freq: Two times a day (BID) | INTRAMUSCULAR | Status: DC
  Filled 2013-11-07: qty 2

## 2013-11-07 MED ORDER — HYDROCODONE-ACETAMINOPHEN 10-325 MG PO TABS
1.0000 | ORAL_TABLET | Freq: Four times a day (QID) | ORAL | Status: DC | PRN
  Administered 2013-11-07 – 2013-11-08 (×5): 1 via ORAL
  Filled 2013-11-07 (×2): qty 1
  Filled 2013-11-07 (×2): qty 2
  Filled 2013-11-07: qty 1

## 2013-11-07 MED ORDER — SODIUM CHLORIDE 0.9 % IJ SOLN: 3.0000 mL | INTRAMUSCULAR | Status: DC | PRN

## 2013-11-07 MED ORDER — PANTOPRAZOLE SODIUM 40 MG PO TBEC
40.0000 mg | DELAYED_RELEASE_TABLET | Freq: Every day | ORAL | Status: DC
  Administered 2013-11-07 – 2013-11-08 (×2): 40 mg via ORAL
  Filled 2013-11-07 (×2): qty 1

## 2013-11-07 MED ORDER — IPRATROPIUM-ALBUTEROL 18-103 MCG/ACT IN AERO: 2.0000 | INHALATION_SPRAY | Freq: Four times a day (QID) | RESPIRATORY_TRACT | Status: DC

## 2013-11-07 MED ORDER — ALBUTEROL SULFATE (2.5 MG/3ML) 0.083% IN NEBU
2.5000 mg | INHALATION_SOLUTION | RESPIRATORY_TRACT | Status: DC | PRN
  Administered 2013-11-07: 2.5 mg via RESPIRATORY_TRACT
  Filled 2013-11-07: qty 3

## 2013-11-07 MED ORDER — NITROGLYCERIN 0.4 MG SL SUBL: 0.4000 mg | SUBLINGUAL_TABLET | SUBLINGUAL | Status: DC | PRN

## 2013-11-07 MED ORDER — SODIUM CHLORIDE 0.9 % IJ SOLN
3.0000 mL | Freq: Two times a day (BID) | INTRAMUSCULAR | Status: DC
  Administered 2013-11-07 – 2013-11-08 (×3): 3 mL via INTRAVENOUS

## 2013-11-07 MED ORDER — ALPRAZOLAM 0.5 MG PO TABS
0.5000 mg | ORAL_TABLET | Freq: Two times a day (BID) | ORAL | Status: DC | PRN
  Administered 2013-11-07: 1 mg via ORAL
  Administered 2013-11-07: 0.5 mg via ORAL
  Filled 2013-11-07: qty 2
  Filled 2013-11-07: qty 1

## 2013-11-07 MED ORDER — PREDNISONE 20 MG PO TABS
20.0000 mg | ORAL_TABLET | Freq: Two times a day (BID) | ORAL | Status: DC
  Administered 2013-11-07: 20 mg via ORAL
  Filled 2013-11-07: qty 1

## 2013-11-07 MED ORDER — AZITHROMYCIN 250 MG PO TABS
250.0000 mg | ORAL_TABLET | Freq: Every day | ORAL | Status: DC
  Administered 2013-11-08: 250 mg via ORAL
  Filled 2013-11-07: qty 1

## 2013-11-07 MED ORDER — IPRATROPIUM BROMIDE 0.02 % IN SOLN: 0.5000 mg | Freq: Four times a day (QID) | RESPIRATORY_TRACT | Status: DC

## 2013-11-07 MED ORDER — POLYETHYLENE GLYCOL 3350 17 GM/SCOOP PO POWD
17.0000 g | Freq: Two times a day (BID) | ORAL | Status: DC
  Filled 2013-11-07: qty 255

## 2013-11-07 MED ORDER — SIMVASTATIN 20 MG PO TABS
40.0000 mg | ORAL_TABLET | Freq: Every day | ORAL | Status: DC
  Administered 2013-11-07: 40 mg via ORAL
  Filled 2013-11-07: qty 2

## 2013-11-07 MED ORDER — ALBUTEROL SULFATE (2.5 MG/3ML) 0.083% IN NEBU: 2.5000 mg | INHALATION_SOLUTION | Freq: Four times a day (QID) | RESPIRATORY_TRACT | Status: DC

## 2013-11-07 MED ORDER — POLYETHYLENE GLYCOL 3350 17 G PO PACK
17.0000 g | PACK | Freq: Two times a day (BID) | ORAL | Status: DC
  Administered 2013-11-07 – 2013-11-08 (×3): 17 g via ORAL
  Filled 2013-11-07 (×3): qty 1

## 2013-11-07 MED ORDER — ASPIRIN EC 81 MG PO TBEC
81.0000 mg | DELAYED_RELEASE_TABLET | Freq: Every morning | ORAL | Status: DC
  Administered 2013-11-07 – 2013-11-08 (×2): 81 mg via ORAL
  Filled 2013-11-07 (×2): qty 1

## 2013-11-07 MED ORDER — METHYLPREDNISOLONE SODIUM SUCC 40 MG IJ SOLR
40.0000 mg | Freq: Four times a day (QID) | INTRAMUSCULAR | Status: DC
  Administered 2013-11-07 – 2013-11-08 (×4): 40 mg via INTRAVENOUS
  Filled 2013-11-07 (×4): qty 1

## 2013-11-07 MED ORDER — MOMETASONE FURO-FORMOTEROL FUM 200-5 MCG/ACT IN AERO
2.0000 | INHALATION_SPRAY | Freq: Two times a day (BID) | RESPIRATORY_TRACT | Status: DC
  Filled 2013-11-07: qty 8.8

## 2013-11-07 MED ORDER — SODIUM CHLORIDE 0.9 % IV SOLN: 250.0000 mL | INTRAVENOUS | Status: DC | PRN

## 2013-11-07 NOTE — Progress Notes (Signed)
PROGRESS NOTE  [name] [name] EQA:[id number] DOB: February 21, 1941 DOA: 11/06/2013 PCP: [name], PA-C  Summary: 73 year old man with history of COPD, chronic hypoxic respiratory failure on home oxygen presented with shortness of breath and wheezing. Admitted for COPD exacerbation.  Assessment/Plan: 1. COPD exacerbation. Improved but not yet ready for discharge. 2. COPD with chronic hypoxic respiratory failure on 2 L nasal cannula at home. 3. Tobacco dependence. Recommend cessation. 4. Chronic back pain 5. Chronic anxiety 6. Chronic pulmonary nodules with one new nodule. Consider repeat CT chest when you to followup.   Stable but still with significant wheezing. Change to oral steroids. Continue bronchodilators, oxygen and antibiotics.  Hopefully home 7/27.  Happy birthday!  Code Status: full code DVT prophylaxis: SCDs Family Communication: none present Disposition Plan: home  [name], MD  [facility]  Pager [phone] If 7PM-7AM, please contact night-coverage at [email or web address], password [facility] 11/07/2013, 9:54 AM  LOS: 1 day   Consultants:    Procedures:    Antibiotics:  Zithromax 7/27 >> 7/31  HPI/Subjective: He is feeling better but not 50% better. Still wheezing. It [birth year].  Objective: Filed Vitals:   11/06/13 2323 11/07/13 0117 11/07/13 0548 11/07/13 0608  BP: 114/69 107/78 105/57   Pulse: 83 75 78   Temp: 97.7 F (36.5 C) 97.9 F (36.6 C) 98 F (36.7 C)   TempSrc: Oral Oral Oral   Resp: 18 20 20    Weight:  78.6 kg (173 lb 4.5 oz)    SpO2: 92% 93% 93% 90%    Intake/Output Summary (Last 24 hours) at 11/07/13 0954 Last data filed at 11/07/13 0905  Gross per 24 hour  Intake      3 ml  Output      0 ml  Net      3 ml     Filed Weights   11/07/13 0117  Weight: 78.6 kg (173 lb 4.5 oz)    Exam:     Afebrile, vital signs stable. Stable hypoxia on 2 L. Gen. He appears calm and comfortable but ill.  Psych. Alert. Speech  fluent and clear. Grossly normal mentation.  Cardiovascular. Regular rate and rhythm. No murmur, gallop, rub. No lower extremity edema.  Respiratory. Bilateral wheezes with relatively poor air movement and prolonged expiration. Able to speak in full sentences. Moderate increased respiratory effort.  Data Reviewed:  Chemistry: Basic metabolic panel unremarkable. Troponin negative.  Heme: CBC with modest leukocytosis.  Other: EKG sinus rhythm. No acute changes.  Imaging: CT of chest no evidence of pneumonia, multiple benign stable pulmonary nodules, one new pulmonary nodule, followup CT in 1 year recommended.  Scheduled Meds: . aspirin EC  81 mg Oral q morning - 10a  . [START ON 11/08/2013] azithromycin  250 mg Oral Daily  . ipratropium-albuterol  3 mL Nebulization QID  . mometasone-formoterol  2 puff Inhalation Q12H  . pantoprazole  40 mg Oral Daily  . polyethylene glycol  17 g Oral BID  . predniSONE  20 mg Oral BID WC  . simvastatin  40 mg Oral QPC supper  . sodium chloride  3 mL Intravenous Q12H  . tamsulosin  0.4 mg Oral q morning - 10a   Continuous Infusions:   Principal Problem:   COPD exacerbation Active Problems:   HYPERTENSION   PULMONARY NODULE   Acute and chronic respiratory failure (acute-on-chronic)   Chronic respiratory failure   Anxiety   Chronic back pain   Tobacco dependence   Time spent 20 minutes

## 2013-11-08 MED ORDER — AZITHROMYCIN 250 MG PO TABS: 250.0000 mg | ORAL_TABLET | Freq: Every day | ORAL | Status: DC

## 2013-11-08 MED ORDER — PREDNISONE 10 MG PO TABS: ORAL_TABLET | ORAL | Status: DC

## 2013-11-08 NOTE — Care Management Note (Signed)
    Page 1 of 1   11/08/2013     10:52:09 AM CARE MANAGEMENT NOTE 11/08/2013  Patient:  [name] [name], [name] [name]   Account Number:  [id number]  Date Initiated:  11/08/2013  Documentation initiated by:  Theophilus Kinds  Subjective/Objective Assessment:   Pt admitted from home with COPD. Pt lives at home with his wife and will return home at discharge. Pt is independent with ADL's. Pt has home O2 with [employer] and neb machine for home use.     Action/Plan:   Pt discharged home today.  No CM needs noted.   Anticipated DC Date:  11/08/2013   Anticipated DC Plan:  [place]  CM consult      Choice offered to / List presented to:             Status of service:  Completed, signed off Medicare Important Message given?  NA - LOS <3 / Initial given by admissions (If response is "NO", the following Medicare IM given date fields will be blank) Date Medicare IM given:   Medicare IM given by:   Date Additional Medicare IM given:   Additional Medicare IM given by:    Discharge Disposition:  HOME/SELF CARE  Per UR Regulation:    If discussed at Long Length of Stay Meetings, dates discussed:    Comments:  11/08/13 [place], RN BSN CM

## 2013-11-08 NOTE — Progress Notes (Signed)
  PROGRESS NOTE  JUN OSMENT RKY:[id number] DOB: [birth year] DOA: 11/06/2013 PCP: [name], PA-C  Summary: 73 year old man with history of COPD, chronic hypoxic respiratory failure on home oxygen presented with shortness of breath and wheezing. Admitted for COPD exacerbation.  Assessment/Plan: 1. COPD exacerbation. Much improved. 2. COPD with chronic hypoxic respiratory failure on 2 L nasal cannula at home. 3. Tobacco dependence. Recommend cessation. 4. Chronic back pain stable. 5. Chronic anxiety. Stable. 6. Chronic pulmonary nodules with one new nodule. Consider repeat CT chest in one year to followup.   Much improved. Plan discharge home today, steroid taper, and Zithromax.  [name], MD  [facility]  Pager [phone] If 7PM-7AM, please contact night-coverage at [email or web address], password [facility] 11/08/2013, 10:31 AM  LOS: 2 days   Consultants:    Procedures:    Antibiotics:  Zithromax 7/27 >> 7/31  HPI/Subjective: Feeling better today. Breathing better today. No new issues.  Objective: Filed Vitals:   11/07/13 2317 11/07/13 2318 11/08/13 0600 11/08/13 0737  BP: 113/62  96/45   Pulse: 72  76   Temp: 98 F (36.7 C)  97.7 F (36.5 C)   TempSrc: Oral     Resp: 20  18   Height:      Weight:      SpO2: 92% 91% 91% 92%    Intake/Output Summary (Last 24 hours) at 11/08/13 1031 Last data filed at 11/07/13 1800  Gross per 24 hour  Intake    960 ml  Output      0 ml  Net    960 ml     Filed Weights   11/07/13 0117  Weight: 78.6 kg (173 lb 4.5 oz)    Exam:     Afebrile, vital signs stable. Stable hypoxia on 2 L. Gen. Appears comfortable, calm. Appears better. Psych. Alert. Speech fluent and clear. Grossly normal mentation. Cardiovascular. Regular rate and rhythm. No murmur, rub or gallop. Respiratory. Clear to auscultation bilaterally with decreased breath sounds. Decreased wheezing today. Decreased respiratory effort. No frank rhonchi or  rales.  Data Reviewed:  Scheduled Meds: . aspirin EC  81 mg Oral q morning - 10a  . azithromycin  250 mg Oral Daily  . ipratropium-albuterol  3 mL Nebulization QID  . methylPREDNISolone (SOLU-MEDROL) injection  40 mg Intravenous Q6H  . pantoprazole  40 mg Oral Daily  . polyethylene glycol  17 g Oral BID  . simvastatin  40 mg Oral QPC supper  . sodium chloride  3 mL Intravenous Q12H  . tamsulosin  0.4 mg Oral q morning - 10a   Continuous Infusions:   Principal Problem:   COPD exacerbation Active Problems:   HYPERTENSION   PULMONARY NODULE   Acute and chronic respiratory failure (acute-on-chronic)   Chronic respiratory failure   Anxiety   Chronic back pain   Tobacco dependence

## 2013-11-08 NOTE — Discharge Summary (Signed)
Physician Discharge Summary  [name] [name]:[id number] DOB: 1941-04-07 DOA: 11/06/2013  PCP: [name], PA-C  Admit date: 11/06/2013 Discharge date: 11/08/2013  Recommendations for Outpatient Follow-up:  1. Resolution of COPD exacerbation. 2. Continue to encourage smoking cessation. 3. Pulmonary nodules. Consider repeat CT 1 year followup.   Follow-up Information   Follow up with [name], PA-C. Schedule an appointment as soon as possible for a visit in 1 week.   Specialty:  General Practice   Contact information:   [address] [place] [address] [phone]      Discharge Diagnoses:  1.  COPD exacerbation 2. Chronic hypoxic respiratory failure on home oxygen 3. Tobacco dependence 4. Chronic pulmonary nodules with one new nodule   Discharge Condition:  improved. Disposition:  home.   Diet recommendation:  regular   Filed Weights   11/07/13 0117  Weight: 78.6 kg (173 lb 4.5 oz)    History of present illness:  73 year old man with history of COPD, chronic hypoxic respiratory failure on home oxygen presented with shortness of breath and wheezing. Admitted for COPD exacerbation.  Hospital Course:  [name] was treated with steroids, bronchodilators and antibiotics. His condition gradually improved and he is now stable for discharge. Hospitalization was uncomplicated. Individual issues as below.  1. COPD exacerbation. Much improved. 2. COPD with chronic hypoxic respiratory failure on 2 L nasal cannula at home. 3. Tobacco dependence. Recommend cessation. 4. Chronic back pain stable. 5. Chronic anxiety. Stable. 6. Chronic pulmonary nodules with one new nodule. Consider repeat CT chest in one year to followup.  Consultants: none Procedures: none Antibiotics:  Zithromax 7/27 >> 7/31  Discharge Instructions  Discharge Instructions   Diet general    Complete by:  As directed      Discharge instructions    Complete by:  As directed   Call  physician or seek immediate medical attention for increased shortness of breath, wheezing, cough or worsening of condition.     Increase activity slowly    Complete by:  As directed             Medication List         albuterol (2.5 MG/3ML) 0.083% nebulizer solution  Commonly known as:  PROVENTIL  Take 2.5 mg by nebulization every 4 (four) hours as needed. For shortness of breath     albuterol-ipratropium 18-103 MCG/ACT inhaler  Commonly known as:  COMBIVENT  Inhale 2 puffs into the lungs 4 (four) times daily.     ALPRAZolam 1 MG tablet  Commonly known as:  XANAX  Take 0.5-1 mg by mouth 2 (two) times daily as needed for anxiety. anxiety     aspirin EC 81 MG tablet  Take 81 mg by mouth every morning.     azithromycin 250 MG tablet  Commonly known as:  ZITHROMAX  Take 1 tablet (250 mg total) by mouth daily. Next dose 7/28 in AM.     budesonide-formoterol 160-4.5 MCG/ACT inhaler  Commonly known as:  SYMBICORT  Inhale 2 puffs into the lungs 2 (two) times daily.     CENTRUM tablet  Take 1 tablet by mouth every morning.     Garlic 6283 MG Caps  Take 1 capsule by mouth every morning.     HYDROcodone-acetaminophen 10-325 MG per tablet  Commonly known as:  NORCO  Take 1 tablet by mouth every 6 (six) hours as needed. For pain     NITROSTAT 0.4 MG SL tablet  Generic drug:  nitroGLYCERIN  Place 1 tablet under the tongue as needed for chest pain.     pantoprazole 40 MG tablet  Commonly known as:  PROTONIX  Take 40 mg by mouth daily.     polyethylene glycol powder powder  Commonly known as:  GLYCOLAX/MIRALAX  Take 17 g by mouth 2 (two) times daily.     predniSONE 10 MG tablet  Commonly known as:  DELTASONE  Start 7/28 in morning . Take 40 mg by mouth daily for 4 days, then take 20 mg by mouth daily for 4 days, then take 10 mg by mouth daily for 4 days, then stop.     simvastatin 40 MG tablet  Commonly known as:  ZOCOR  Take 40 mg by mouth every morning.      tamsulosin 0.4 MG Caps capsule  Commonly known as:  FLOMAX  Take 0.4 mg by mouth every morning.     Vitamin D3 1000 UNITS Caps  Take 1 capsule by mouth every morning.       Allergies  Allergen Reactions  . Iohexol Hives     Code: HIVES, Desc: PT STATES HE BROKE OUT IN HIVES AND RASH AFTER CT NECK EARLY SEPT 2011; NO RESP PROBLEMS; NEEDS PRE-MEDS; MKS, Onset Date: [id number]     The results of significant diagnostics from this hospitalization (including imaging, microbiology, ancillary and laboratory) are listed below for reference.    Significant Diagnostic Studies: Dg Chest 2 View  11/06/2013   CLINICAL DATA:  Syncope and difficulty breathing  EXAM: CHEST  2 VIEW  COMPARISON:  July 30, 2013  FINDINGS: There is underlying emphysematous change. The interstitium is diffusely prominent but stable, most likely representing chronic inflammatory type change. There is no frank edema or consolidation. There are two rather subtle nodular opacities in the right upper lobe, each measuring slightly less than 1 cm in size. The heart size and pulmonary vascularity are within normal limits. No adenopathy. No bone lesions.  IMPRESSION: There are two rather subtle nodular opacities in the right upper lobe, each measuring slightly less than 1 cm in size. Given this finding, correlation with noncontrast enhanced chest CT is advised to further evaluate.  There is underlying emphysema with what appears to be chronic inflammatory type change in the lungs, stable. There is 9 frank edema or consolidation.   Electronically Signed   By: Lowella Grip M.D.   On: 11/06/2013 18:48   Ct Chest Wo Contrast  11/06/2013   CLINICAL DATA:  Shortness of breath, increasing productive cough for 1 day, nodular density seen on chest radiograph performed earlier today  EXAM: CT CHEST WITHOUT CONTRAST  TECHNIQUE: Multidetector CT imaging of the chest was performed following the standard protocol without IV contrast.  COMPARISON:   11/06/2013, 06/21/2010  FINDINGS: There are numerous bilateral nodular opacities the majority of which are stable when compared to 2012 and therefore considered to be benign. The largest measures about 14 mm in the right upper lobe and represents fibrosis. There is a new 1 cm focus of mild fibrosis right lower lobe image number 43. There is a new 4 mm pulmonary nodule superior segment left lower lobe image number 29.  There is moderate diffuse emphysematous change. There are no pleural or pericardial effusions. There are small mediastinal lymph nodes which appear nonpathologic.  There are no acute musculoskeletal findings.  IMPRESSION: No evidence of infiltrate to suggest pneumonia. Multiple stable benign pulmonary nodules. There is 1 new pulmonary nodule in the left lower lobe  which measures 4 mm. Regarding this nodule, if the patient is at high risk for bronchogenic carcinoma, follow-up chest CT at 1 year is recommended. If the patient is at low risk, no follow-up is needed. This recommendation follows the consensus statement: Guidelines for Management of Small Pulmonary Nodules Detected on CT Scans: A Statement from the [employer] as published in Radiology 2005; 237:395-400.   Electronically Signed   By: Skipper Cliche M.D.   On: 11/06/2013 20:58   Labs: Basic Metabolic Panel:  Recent Labs Lab 11/06/13 2005  NA 135*  K 4.3  CL 93*  CO2 33*  GLUCOSE 109*  BUN 11  CREATININE 0.80  CALCIUM 9.5   CBC:  Recent Labs Lab 11/06/13 2005  WBC 10.6*  NEUTROABS 5.8  HGB 13.4  HCT 40.6  MCV 94.4  PLT 215   Cardiac Enzymes:  Recent Labs Lab 11/06/13 2005  TROPONINI <0.30     Principal Problem:   COPD exacerbation Active Problems:   HYPERTENSION   PULMONARY NODULE   Acute and chronic respiratory failure (acute-on-chronic)   Chronic respiratory failure   Anxiety   Chronic back pain   Tobacco dependence   Time coordinating discharge: 20 minutes  Signed:  [name], MD [facility] 11/08/2013, 10:34 AM

## 2014-01-20 ENCOUNTER — Emergency Department (HOSPITAL_COMMUNITY): Payer: Medicare Other

## 2014-01-20 ENCOUNTER — Encounter (HOSPITAL_COMMUNITY): Payer: Self-pay | Admitting: Emergency Medicine

## 2014-01-20 ENCOUNTER — Emergency Department (HOSPITAL_COMMUNITY)
Admission: EM | Admit: 2014-01-20 | Discharge: 2014-01-20 | Disposition: A | Discharge status: Home / Self Care | Payer: Medicare Other | Attending: Emergency Medicine | Admitting: Emergency Medicine

## 2014-01-20 DIAGNOSIS — G8929 Other chronic pain: Secondary | ICD-10-CM | POA: Insufficient documentation

## 2014-01-20 DIAGNOSIS — Z7982 Long term (current) use of aspirin: Secondary | ICD-10-CM | POA: Insufficient documentation

## 2014-01-20 DIAGNOSIS — Z9981 Dependence on supplemental oxygen: Secondary | ICD-10-CM | POA: Insufficient documentation

## 2014-01-20 DIAGNOSIS — J441 Chronic obstructive pulmonary disease with (acute) exacerbation: Secondary | ICD-10-CM | POA: Diagnosis not present

## 2014-01-20 DIAGNOSIS — Z79899 Other long term (current) drug therapy: Secondary | ICD-10-CM | POA: Insufficient documentation

## 2014-01-20 DIAGNOSIS — F419 Anxiety disorder, unspecified: Secondary | ICD-10-CM | POA: Insufficient documentation

## 2014-01-20 DIAGNOSIS — K219 Gastro-esophageal reflux disease without esophagitis: Secondary | ICD-10-CM | POA: Diagnosis not present

## 2014-01-20 DIAGNOSIS — R0602 Shortness of breath: Secondary | ICD-10-CM | POA: Diagnosis present

## 2014-01-20 DIAGNOSIS — Z7951 Long term (current) use of inhaled steroids: Secondary | ICD-10-CM | POA: Diagnosis not present

## 2014-01-20 DIAGNOSIS — E780 Pure hypercholesterolemia: Secondary | ICD-10-CM | POA: Insufficient documentation

## 2014-01-20 DIAGNOSIS — N400 Enlarged prostate without lower urinary tract symptoms: Secondary | ICD-10-CM | POA: Diagnosis not present

## 2014-01-20 LAB — CBC WITH DIFFERENTIAL/PLATELET
BASOS PCT: 0 % (ref 0–1)
Basophils Absolute: 0 10*3/uL (ref 0.0–0.1)
EOS PCT: 3 % (ref 0–5)
Eosinophils Absolute: 0.3 10*3/uL (ref 0.0–0.7)
HEMATOCRIT: 44 % (ref 39.0–52.0)
Hemoglobin: 14.1 g/dL (ref 13.0–17.0)
Lymphocytes Relative: 36 % (ref 12–46)
Lymphs Abs: 3.6 10*3/uL (ref 0.7–4.0)
MCH: 30.7 pg (ref 26.0–34.0)
MCHC: 32 g/dL (ref 30.0–36.0)
MCV: 95.9 fL (ref 78.0–100.0)
MONOS PCT: 9 % (ref 3–12)
Monocytes Absolute: 0.9 10*3/uL (ref 0.1–1.0)
NEUTROS ABS: 5.2 10*3/uL (ref 1.7–7.7)
Neutrophils Relative %: 52 % (ref 43–77)
Platelets: 195 10*3/uL (ref 150–400)
RBC: 4.59 MIL/uL (ref 4.22–5.81)
RDW: 13.5 % (ref 11.5–15.5)
WBC: 10 10*3/uL (ref 4.0–10.5)

## 2014-01-20 LAB — COMPREHENSIVE METABOLIC PANEL
ALBUMIN: 4.1 g/dL (ref 3.5–5.2)
ALT: 23 U/L (ref 0–53)
ANION GAP: 9 (ref 5–15)
AST: 22 U/L (ref 0–37)
Alkaline Phosphatase: 85 U/L (ref 39–117)
BUN: 13 mg/dL (ref 6–23)
CALCIUM: 9.5 mg/dL (ref 8.4–10.5)
CO2: 34 mEq/L — ABNORMAL HIGH (ref 19–32)
Chloride: 96 mEq/L (ref 96–112)
Creatinine, Ser: 0.81 mg/dL (ref 0.50–1.35)
GFR calc non Af Amer: 87 mL/min — ABNORMAL LOW (ref >90)
GLUCOSE: 90 mg/dL (ref 70–99)
Potassium: 4.4 mEq/L (ref 3.7–5.3)
Sodium: 139 mEq/L (ref 137–147)
Total Bilirubin: 0.3 mg/dL (ref 0.3–1.2)
Total Protein: 7.6 g/dL (ref 6.0–8.3)

## 2014-01-20 MED ORDER — IPRATROPIUM-ALBUTEROL 0.5-2.5 (3) MG/3ML IN SOLN
3.0000 mL | Freq: Once | RESPIRATORY_TRACT | Status: AC
  Administered 2014-01-20: 3 mL via RESPIRATORY_TRACT
  Filled 2014-01-20: qty 3

## 2014-01-20 MED ORDER — METHYLPREDNISOLONE SODIUM SUCC 125 MG IJ SOLR
125.0000 mg | Freq: Once | INTRAMUSCULAR | Status: AC
  Administered 2014-01-20: 125 mg via INTRAVENOUS
  Filled 2014-01-20: qty 2

## 2014-01-20 MED ORDER — PREDNISONE 10 MG PO TABS: 40.0000 mg | ORAL_TABLET | Freq: Every day | ORAL | Status: DC

## 2014-01-20 MED ORDER — SODIUM CHLORIDE 0.9 % IV SOLN
INTRAVENOUS | Status: DC
  Administered 2014-01-20: 19:00:00 via INTRAVENOUS

## 2014-01-20 NOTE — ED Provider Notes (Signed)
CSN: [id number]     Arrival date & time 01/20/14  1350 History   First MD Initiated Contact with Patient 01/20/14 1711     No chief complaint on file.    (Consider location/radiation/quality/duration/timing/severity/associated sxs/prior Treatment) The history is provided by the patient and the spouse.   patient with known COPD. Patient on oxygen 2-3 L all the time. Patient has felt tired short of breath for the past 3 days. Has occasional productive cough but no worsening of that. Denies fevers. Patient does use albuterol inhaler and nebulizers at home. Currently not on any steroids. Patient is [facility] by [facility].  Past Medical History  Diagnosis Date  . COPD (chronic obstructive pulmonary disease)   . Asthma   . Tobacco abuse 09/29/2011  . Hypercholesteremia   . GERD (gastroesophageal reflux disease)   . Headache(784.0)   . BPH (benign prostatic hyperplasia) 10/01/2011    Per cystoscopy  . 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  . Pulmonary nodule 09/2011    Stable appearance  . Chronic back pain   . Chronic anxiety   . Hyperlipidemia 03/13/2012  . Anxiety 03/13/2012  . On home O2     2L N/C continuous  . Emphysema lung    Past Surgical History  Procedure Laterality Date  . Cystoscopy  09/30/2011    Procedure: CYSTOSCOPY FLEXIBLE;  Surgeon: Marissa Nestle, MD;  Location: [facility];  Service: Urology;  Laterality: N/A;  . Transurethral resection of bladder tumor  10/01/2011    Procedure: TRANSURETHRAL RESECTION OF BLADDER TUMOR (TURBT);  Surgeon: Marissa Nestle, MD;  Location: [facility];  Service: Urology;  Laterality: N/A;  . Cardiac catheterization  2003    [facility]  . Fracture surgery      R ring finger, pin placed   Family History  Problem Relation Age of Onset  . Emphysema Brother     was not close to family; does not truly know their medical problems.  . Emphysema Brother   . Emphysema Brother   . Cancer Neg Hx    History   Substance Use Topics  . Smoking status: Current Every Day Smoker -- 1.00 packs/day for 58 years    Types: Cigarettes  . Smokeless tobacco: Not on file  . Alcohol Use: No    Review of Systems  Constitutional: Positive for fatigue. Negative for fever.  HENT: Negative for congestion.   Eyes: Negative for visual disturbance.  Respiratory: Positive for cough, shortness of breath and wheezing.   Cardiovascular: Positive for leg swelling. Negative for chest pain.  Gastrointestinal: Negative for nausea, vomiting and abdominal pain.  Genitourinary: Negative for dysuria.  Musculoskeletal: Negative for back pain.  Skin: Negative for rash.  Neurological: Negative for headaches.  Psychiatric/Behavioral: Negative for confusion.      Allergies  Iohexol  Home Medications   Prior to Admission medications   Medication Sig Start Date End Date Taking? Authorizing Provider  albuterol (PROVENTIL) (2.5 MG/3ML) 0.083% nebulizer solution Take 2.5 mg by nebulization every 4 (four) hours as needed. For shortness of breath   Yes Historical Provider, MD  albuterol-ipratropium (COMBIVENT) 18-103 MCG/ACT inhaler Inhale 2 puffs into the lungs 4 (four) times daily. 03/30/13  Yes Rexene Alberts, MD  ALPRAZolam Duanne Moron) 1 MG tablet Take 0.5-1 mg by mouth 2 (two) times daily as needed for anxiety. anxiety 02/20/13  Yes Domenic Polite, MD  aspirin EC 81 MG tablet Take 81 mg by mouth every morning.  10/02/11  Yes Rexene Alberts, MD  budesonide-formoterol [facility]) 160-4.5 MCG/ACT inhaler Inhale 2 puffs into the lungs 2 (two) times daily. 03/30/13  Yes Rexene Alberts, MD  Cholecalciferol (VITAMIN D3) 1000 UNITS CAPS Take 1 capsule by mouth every morning.    Yes Historical Provider, MD  Garlic 7169 MG CAPS Take 1 capsule by mouth every morning.    Yes Historical Provider, MD  HYDROcodone-acetaminophen (NORCO) 10-325 MG per tablet Take 1 tablet by mouth every 6 (six) hours as needed. For pain 06/14/13  Yes Historical  Provider, MD  Multiple Vitamins-Minerals (CENTRUM) tablet Take 1 tablet by mouth every morning.    Yes Historical Provider, MD  NEXIUM 40 MG capsule Take 1 capsule by mouth daily. 01/15/14  Yes Historical Provider, MD  NITROSTAT 0.4 MG SL tablet Place 1 tablet under the tongue as needed for chest pain.  02/22/13  Yes Historical Provider, MD  polyethylene glycol powder (GLYCOLAX/MIRALAX) powder Take 17 g by mouth 2 (two) times daily.   Yes Historical Provider, MD  simvastatin (ZOCOR) 40 MG tablet Take 40 mg by mouth every morning.    Yes Historical Provider, MD  tamsulosin (FLOMAX) 0.4 MG CAPS capsule Take 0.4 mg by mouth every morning.  07/06/13  Yes Historical Provider, MD  predniSONE (DELTASONE) 10 MG tablet Take 4 tablets (40 mg total) by mouth daily. 01/20/14   Fredia Sorrow, MD   BP 121/80  Pulse 67  Temp(Src) 97.6 F (36.4 C) (Oral)  Resp 21  Ht 5\' 5"  (1.651 m)  Wt 171 lb (77.565 kg)  BMI 28.46 kg/m2  SpO2 94% Physical Exam  Nursing note and vitals reviewed. Constitutional: He is oriented to person, place, and time. He appears well-developed and well-nourished. No distress.  HENT:  Head: Normocephalic and atraumatic.  Mouth/Throat: Oropharynx is clear and moist.  Eyes: Conjunctivae and EOM are normal. Pupils are equal, round, and reactive to light.  Neck: Normal range of motion.  Cardiovascular: Normal rate, regular rhythm and normal heart sounds.   No murmur heard. Pulmonary/Chest: Effort normal. He has wheezes.  Abdominal: Soft. Bowel sounds are normal. There is no tenderness.  Musculoskeletal: Normal range of motion. He exhibits no edema.  Neurological: He is alert and oriented to person, place, and time. No cranial nerve deficit. He exhibits normal muscle tone. Coordination normal.  Skin: Skin is warm. No rash noted.    ED Course  Procedures (including critical care time) Labs Review Labs Reviewed  COMPREHENSIVE METABOLIC PANEL - Abnormal; Notable for the following:     CO2 34 (*)    GFR calc non Af Amer 87 (*)    All other components within normal limits  CBC WITH DIFFERENTIAL   Results for orders placed during the hospital encounter of 01/20/14  CBC WITH DIFFERENTIAL      Result Value Ref Range   WBC 10.0  4.0 - 10.5 K/uL   RBC 4.59  4.22 - 5.81 MIL/uL   Hemoglobin 14.1  13.0 - 17.0 g/dL   HCT 44.0  39.0 - 52.0 %   MCV 95.9  78.0 - 100.0 fL   MCH 30.7  26.0 - 34.0 pg   MCHC 32.0  30.0 - 36.0 g/dL   RDW 13.5  11.5 - 15.5 %   Platelets 195  150 - 400 K/uL   Neutrophils Relative % 52  43 - 77 %   Neutro Abs 5.2  1.7 - 7.7 K/uL   Lymphocytes Relative 36  12 - 46 %   Lymphs Abs  3.6  0.7 - 4.0 K/uL   Monocytes Relative 9  3 - 12 %   Monocytes Absolute 0.9  0.1 - 1.0 K/uL   Eosinophils Relative 3  0 - 5 %   Eosinophils Absolute 0.3  0.0 - 0.7 K/uL   Basophils Relative 0  0 - 1 %   Basophils Absolute 0.0  0.0 - 0.1 K/uL   WBC Morphology ATYPICAL LYMPHOCYTES    COMPREHENSIVE METABOLIC PANEL      Result Value Ref Range   Sodium 139  137 - 147 mEq/L   Potassium 4.4  3.7 - 5.3 mEq/L   Chloride 96  96 - 112 mEq/L   CO2 34 (*) 19 - 32 mEq/L   Glucose, Bld 90  70 - 99 mg/dL   BUN 13  6 - 23 mg/dL   Creatinine, Ser 0.81  0.50 - 1.35 mg/dL   Calcium 9.5  8.4 - 10.5 mg/dL   Total Protein 7.6  6.0 - 8.3 g/dL   Albumin 4.1  3.5 - 5.2 g/dL   AST 22  0 - 37 U/L   ALT 23  0 - 53 U/L   Alkaline Phosphatase 85  39 - 117 U/L   Total Bilirubin 0.3  0.3 - 1.2 mg/dL   GFR calc non Af Amer 87 (*) >90 mL/min   GFR calc Af Amer >90  >90 mL/min   Anion gap 9  5 - 15   As an 8  Imaging Review Dg Chest 2 View  01/20/2014   CLINICAL DATA:  Productive cough for 2 weeks, yellow sputum, shortness of breath, wheezing  EXAM: CHEST  2 VIEW  COMPARISON:  CT chest 11/06/2013, 06/21/2010  FINDINGS: There is right upper lobe fibrosis. There is bilateral chronic interstitial thickening. There is no focal parenchymal opacity, pleural effusion, or pneumothorax. The heart  and mediastinal contours are unremarkable.  The osseous structures are unremarkable.  IMPRESSION: No active cardiopulmonary disease.   Electronically Signed   By: Kathreen Devoid   On: 01/20/2014 15:33     EKG Interpretation   Date/Time:  Thursday January 20 2014 18:51:12 EDT Ventricular Rate:  67 PR Interval:  210 QRS Duration: 79 QT Interval:  393 QTC Calculation: 415 R Axis:   94 Text Interpretation:  Sinus rhythm Right axis deviation Abnormal R-wave  progression, early transition Minimal ST elevation, inferior leads No  significant change since last tracing Confirmed by Elfriede Bonini  MD, Lashandra Arauz  ([id number]) on 01/20/2014 7:42:05 PM      MDM   Final diagnoses:  Chronic obstructive pulmonary disease with acute exacerbation   Patient with an exacerbation of COPD. Patient was satting on his normal amount of oxygen at about 88% room air. Following a duo neb patient cleared up significantly he also received 125 mg Solu-Medrol IV. No further wheezing. Patient now satting anywhere from 92% to 97%. Patient states that he feels completely fine. Chest x-ray negative for pneumonia. Labs without significant abnormalities. No significant leukocytosis or anemia. Patient has primary care Dr. to followup with. Will continue using albuterol every 6 hours. And we'll take the prednisone starting tomorrow for the next 5 days. Patient return for any newer worse symptoms.   Fredia Sorrow, MD 01/20/14 2033

## 2014-01-20 NOTE — ED Notes (Signed)
having SOB at rest.  C/o pain to back rates pain 10 when standing and pain decreases when laying down.

## 2014-01-20 NOTE — Discharge Instructions (Signed)
Continue your albuterol treatments every 6 hours. Take prednisone as directed starting tomorrow. Continue to use her oxygen at 2 or 3 L. Return for any new or worse symptoms. Make an appointment to followup with your record Dr.

## 2014-01-20 NOTE — ED Notes (Signed)
Feels weak, tired, for last 3 days.  Pt is on  02 2 L.  No NVD,   Cough,

## 2014-02-09 ENCOUNTER — Encounter (HOSPITAL_COMMUNITY): Payer: Self-pay | Admitting: Emergency Medicine

## 2014-02-09 ENCOUNTER — Emergency Department (HOSPITAL_COMMUNITY): Payer: Medicare Other

## 2014-02-09 ENCOUNTER — Inpatient Hospital Stay (HOSPITAL_COMMUNITY)
Admission: EM | Admit: 2014-02-09 | Discharge: 2014-02-13 | DRG: 190 | Disposition: A | Discharge status: Home / Self Care | Payer: Medicare Other | Attending: Family Medicine | Admitting: Family Medicine

## 2014-02-09 DIAGNOSIS — J9690 Respiratory failure, unspecified, unspecified whether with hypoxia or hypercapnia: Secondary | ICD-10-CM | POA: Diagnosis present

## 2014-02-09 DIAGNOSIS — F17210 Nicotine dependence, cigarettes, uncomplicated: Secondary | ICD-10-CM | POA: Diagnosis present

## 2014-02-09 DIAGNOSIS — E785 Hyperlipidemia, unspecified: Secondary | ICD-10-CM

## 2014-02-09 DIAGNOSIS — Z79899 Other long term (current) drug therapy: Secondary | ICD-10-CM | POA: Diagnosis not present

## 2014-02-09 DIAGNOSIS — J449 Chronic obstructive pulmonary disease, unspecified: Secondary | ICD-10-CM | POA: Diagnosis present

## 2014-02-09 DIAGNOSIS — F419 Anxiety disorder, unspecified: Secondary | ICD-10-CM | POA: Diagnosis present

## 2014-02-09 DIAGNOSIS — R0602 Shortness of breath: Secondary | ICD-10-CM | POA: Diagnosis present

## 2014-02-09 DIAGNOSIS — K219 Gastro-esophageal reflux disease without esophagitis: Secondary | ICD-10-CM | POA: Diagnosis present

## 2014-02-09 DIAGNOSIS — Z825 Family history of asthma and other chronic lower respiratory diseases: Secondary | ICD-10-CM

## 2014-02-09 DIAGNOSIS — T380X5A Adverse effect of glucocorticoids and synthetic analogues, initial encounter: Secondary | ICD-10-CM | POA: Diagnosis present

## 2014-02-09 DIAGNOSIS — E780 Pure hypercholesterolemia: Secondary | ICD-10-CM | POA: Diagnosis present

## 2014-02-09 DIAGNOSIS — Z7982 Long term (current) use of aspirin: Secondary | ICD-10-CM | POA: Diagnosis not present

## 2014-02-09 DIAGNOSIS — Z720 Tobacco use: Secondary | ICD-10-CM

## 2014-02-09 DIAGNOSIS — J441 Chronic obstructive pulmonary disease with (acute) exacerbation: Secondary | ICD-10-CM | POA: Diagnosis not present

## 2014-02-09 DIAGNOSIS — Z8551 Personal history of malignant neoplasm of bladder: Secondary | ICD-10-CM | POA: Diagnosis not present

## 2014-02-09 DIAGNOSIS — G8929 Other chronic pain: Secondary | ICD-10-CM | POA: Diagnosis present

## 2014-02-09 DIAGNOSIS — Z79891 Long term (current) use of opiate analgesic: Secondary | ICD-10-CM | POA: Diagnosis not present

## 2014-02-09 DIAGNOSIS — Z9119 Patient's noncompliance with other medical treatment and regimen: Secondary | ICD-10-CM | POA: Diagnosis present

## 2014-02-09 DIAGNOSIS — Z9981 Dependence on supplemental oxygen: Secondary | ICD-10-CM

## 2014-02-09 DIAGNOSIS — N400 Enlarged prostate without lower urinary tract symptoms: Secondary | ICD-10-CM | POA: Diagnosis present

## 2014-02-09 LAB — BLOOD GAS, ARTERIAL
Acid-Base Excess: 7.8 mmol/L — ABNORMAL HIGH (ref 0.0–2.0)
Bicarbonate: 33.7 mEq/L — ABNORMAL HIGH (ref 20.0–24.0)
Drawn by: 38235
FIO2: 0.4 %
O2 Saturation: 88.2 %
PCO2 ART: 66.9 mmHg — AB (ref 35.0–45.0)
PO2 ART: 54.2 mmHg — AB (ref 80.0–100.0)
Patient temperature: 37
TCO2: 30.6 mmol/L (ref 0–100)
pH, Arterial: 7.323 — ABNORMAL LOW (ref 7.350–7.450)

## 2014-02-09 LAB — CBC WITH DIFFERENTIAL/PLATELET
Basophils Absolute: 0.1 10*3/uL (ref 0.0–0.1)
Basophils Relative: 1 % (ref 0–1)
EOS ABS: 0.4 10*3/uL (ref 0.0–0.7)
Eosinophils Relative: 4 % (ref 0–5)
HCT: 42.8 % (ref 39.0–52.0)
HEMOGLOBIN: 13.5 g/dL (ref 13.0–17.0)
LYMPHS ABS: 3 10*3/uL (ref 0.7–4.0)
Lymphocytes Relative: 28 % (ref 12–46)
MCH: 30.7 pg (ref 26.0–34.0)
MCHC: 31.5 g/dL (ref 30.0–36.0)
MCV: 97.3 fL (ref 78.0–100.0)
MONOS PCT: 8 % (ref 3–12)
Monocytes Absolute: 0.9 10*3/uL (ref 0.1–1.0)
NEUTROS ABS: 6.4 10*3/uL (ref 1.7–7.7)
Neutrophils Relative %: 59 % (ref 43–77)
Platelets: 233 10*3/uL (ref 150–400)
RBC: 4.4 MIL/uL (ref 4.22–5.81)
RDW: 13.7 % (ref 11.5–15.5)
WBC: 10.7 10*3/uL — ABNORMAL HIGH (ref 4.0–10.5)

## 2014-02-09 LAB — BASIC METABOLIC PANEL
Anion gap: 6 (ref 5–15)
BUN: 9 mg/dL (ref 6–23)
CO2: 39 mEq/L — ABNORMAL HIGH (ref 19–32)
Calcium: 9.9 mg/dL (ref 8.4–10.5)
Chloride: 97 mEq/L (ref 96–112)
Creatinine, Ser: 0.78 mg/dL (ref 0.50–1.35)
GFR calc Af Amer: >90 mL/min (ref >90)
GFR calc non Af Amer: 88 mL/min — ABNORMAL LOW (ref >90)
GLUCOSE: 111 mg/dL — AB (ref 70–99)
Potassium: 4.5 mEq/L (ref 3.7–5.3)
Sodium: 142 mEq/L (ref 137–147)

## 2014-02-09 LAB — PRO B NATRIURETIC PEPTIDE: Pro B Natriuretic peptide (BNP): 115.9 pg/mL (ref 0–125)

## 2014-02-09 MED ORDER — ALBUTEROL SULFATE (2.5 MG/3ML) 0.083% IN NEBU
INHALATION_SOLUTION | RESPIRATORY_TRACT | Status: AC
  Filled 2014-02-09: qty 6

## 2014-02-09 MED ORDER — METHYLPREDNISOLONE SODIUM SUCC 125 MG IJ SOLR
60.0000 mg | Freq: Four times a day (QID) | INTRAMUSCULAR | Status: DC
  Administered 2014-02-09 – 2014-02-12 (×9): 60 mg via INTRAVENOUS
  Filled 2014-02-09 (×9): qty 2

## 2014-02-09 MED ORDER — POLYETHYLENE GLYCOL 3350 17 G PO PACK
17.0000 g | PACK | Freq: Every day | ORAL | Status: DC | PRN
Start: 2014-02-09 — End: 2014-02-13

## 2014-02-09 MED ORDER — PANTOPRAZOLE SODIUM 40 MG PO TBEC
80.0000 mg | DELAYED_RELEASE_TABLET | Freq: Every day | ORAL | Status: DC
  Administered 2014-02-10 – 2014-02-12 (×3): 80 mg via ORAL
  Filled 2014-02-09 (×3): qty 2

## 2014-02-09 MED ORDER — SENNA 8.6 MG PO TABS
1.0000 | ORAL_TABLET | Freq: Two times a day (BID) | ORAL | Status: DC
  Administered 2014-02-10 – 2014-02-13 (×6): 8.6 mg via ORAL
  Filled 2014-02-09 (×7): qty 1

## 2014-02-09 MED ORDER — ALBUTEROL SULFATE (2.5 MG/3ML) 0.083% IN NEBU
2.5000 mg | INHALATION_SOLUTION | Freq: Once | RESPIRATORY_TRACT | Status: AC
  Administered 2014-02-09: 2.5 mg via RESPIRATORY_TRACT
  Filled 2014-02-09: qty 3

## 2014-02-09 MED ORDER — ALBUTEROL SULFATE (2.5 MG/3ML) 0.083% IN NEBU
5.0000 mg | INHALATION_SOLUTION | Freq: Once | RESPIRATORY_TRACT | Status: AC
  Administered 2014-02-09: 5 mg via RESPIRATORY_TRACT

## 2014-02-09 MED ORDER — IPRATROPIUM-ALBUTEROL 0.5-2.5 (3) MG/3ML IN SOLN
3.0000 mL | Freq: Once | RESPIRATORY_TRACT | Status: AC
  Administered 2014-02-09: 3 mL via RESPIRATORY_TRACT
  Filled 2014-02-09: qty 3

## 2014-02-09 MED ORDER — SIMVASTATIN 20 MG PO TABS
40.0000 mg | ORAL_TABLET | Freq: Every morning | ORAL | Status: DC
  Administered 2014-02-10 – 2014-02-13 (×4): 40 mg via ORAL
  Filled 2014-02-09 (×4): qty 2

## 2014-02-09 MED ORDER — IPRATROPIUM-ALBUTEROL 0.5-2.5 (3) MG/3ML IN SOLN
3.0000 mL | RESPIRATORY_TRACT | Status: DC
  Administered 2014-02-09 – 2014-02-13 (×21): 3 mL via RESPIRATORY_TRACT
  Filled 2014-02-09 (×22): qty 3

## 2014-02-09 MED ORDER — ROFLUMILAST 500 MCG PO TABS
500.0000 ug | ORAL_TABLET | Freq: Every day | ORAL | Status: DC
  Administered 2014-02-10 – 2014-02-13 (×4): 500 ug via ORAL
  Filled 2014-02-09 (×5): qty 1

## 2014-02-09 MED ORDER — ONDANSETRON HCL 4 MG/2ML IJ SOLN: 4.0000 mg | Freq: Four times a day (QID) | INTRAMUSCULAR | Status: DC | PRN

## 2014-02-09 MED ORDER — HEPARIN SODIUM (PORCINE) 5000 UNIT/ML IJ SOLN
5000.0000 [IU] | Freq: Three times a day (TID) | INTRAMUSCULAR | Status: DC
Start: 2014-02-10 — End: 2014-02-13
  Administered 2014-02-10 – 2014-02-13 (×10): 5000 [IU] via SUBCUTANEOUS
  Filled 2014-02-09 (×10): qty 1

## 2014-02-09 MED ORDER — ASPIRIN EC 81 MG PO TBEC
81.0000 mg | DELAYED_RELEASE_TABLET | Freq: Every morning | ORAL | Status: DC
  Administered 2014-02-10 – 2014-02-13 (×4): 81 mg via ORAL
  Filled 2014-02-09 (×4): qty 1

## 2014-02-09 MED ORDER — METHYLPREDNISOLONE SODIUM SUCC 125 MG IJ SOLR
125.0000 mg | Freq: Once | INTRAMUSCULAR | Status: AC
Start: 2014-02-09 — End: 2014-02-09
  Administered 2014-02-09: 125 mg via INTRAVENOUS
  Filled 2014-02-09: qty 2

## 2014-02-09 MED ORDER — TAMSULOSIN HCL 0.4 MG PO CAPS
0.4000 mg | ORAL_CAPSULE | Freq: Every morning | ORAL | Status: DC
  Administered 2014-02-10 – 2014-02-13 (×4): 0.4 mg via ORAL
  Filled 2014-02-09 (×4): qty 1

## 2014-02-09 MED ORDER — HYDROCODONE-ACETAMINOPHEN 10-325 MG PO TABS
1.0000 | ORAL_TABLET | Freq: Four times a day (QID) | ORAL | Status: DC | PRN
  Administered 2014-02-09 – 2014-02-13 (×9): 1 via ORAL
  Filled 2014-02-09 (×9): qty 1

## 2014-02-09 MED ORDER — ONDANSETRON HCL 4 MG PO TABS: 4.0000 mg | ORAL_TABLET | Freq: Four times a day (QID) | ORAL | Status: DC | PRN

## 2014-02-09 MED ORDER — ALPRAZOLAM 0.5 MG PO TABS
0.5000 mg | ORAL_TABLET | Freq: Two times a day (BID) | ORAL | Status: DC | PRN
  Administered 2014-02-10: 1 mg via ORAL
  Filled 2014-02-09: qty 2

## 2014-02-09 MED ORDER — NICOTINE 21 MG/24HR TD PT24
21.0000 mg | MEDICATED_PATCH | Freq: Every day | TRANSDERMAL | Status: DC
  Filled 2014-02-09: qty 1

## 2014-02-09 NOTE — ED Notes (Signed)
CRITICAL VALUE ALERT  Critical value received:  CO2: 66.9  Date of notification:  02/09/14  Time of notification:  2015  Critical value read back: yes Nurse who received alert:  Cheri Rous  MD notified (1st page):  [place]  Time of first page:  2015  MD notified (2nd page):  Time of second page:  Responding MD:  Roderic Palau  Time MD responded:  2015

## 2014-02-09 NOTE — ED Notes (Addendum)
Pt 81% at home on O2 at 2L per EMS. Pt given 2 albuterol treatments and O2 at 4L, 95% upon ED arrival. Pt alert and oriented x 4 .

## 2014-02-09 NOTE — ED Provider Notes (Signed)
CSN: [id number]     Arrival date & time 02/09/14  1424 History  This chart was scribed for Maudry Diego, MD by Rayfield Citizen, ED Scribe. This patient was seen in room APA07/APA07 and the patient's care was started at 3:53 PM.    Chief Complaint  Patient presents with  . Shortness of Breath   Patient is a 73 y.o. male presenting with shortness of breath. The history is provided by the patient (pt complains of SOB). No language interpreter was used.  Shortness of Breath Severity:  Moderate Onset quality:  Gradual Timing:  Constant Chronicity:  Recurrent Context: activity   Relieved by:  Oxygen Worsened by:  Activity Associated symptoms: wheezing   Associated symptoms: no abdominal pain, no chest pain, no cough, no headaches and no rash     HPI Comments: [name] [name] is a 73 y.o. male with past medical history of COPD who presents to the Emergency Department complaining of wheezing and SOB. Patient's family reports that he has trouble ambulating (is "wobbly"/his "oxygen drops" when walking) and he has fallen twice. Patient is on 2-2.5L O2 at home.  He was previously admitted to the hospital for his breathing in July, 2015.   Past Medical History  Diagnosis Date  . COPD (chronic obstructive pulmonary disease)   . Asthma   . Tobacco abuse 09/29/2011  . Hypercholesteremia   . GERD (gastroesophageal reflux disease)   . Headache(784.0)   . BPH (benign prostatic hyperplasia) 10/01/2011    Per cystoscopy  . 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  . Pulmonary nodule 09/2011    Stable appearance  . Chronic back pain   . Chronic anxiety   . Hyperlipidemia 03/13/2012  . Anxiety 03/13/2012  . On home O2     2L N/C continuous  . Emphysema lung    Past Surgical History  Procedure Laterality Date  . Cystoscopy  09/30/2011    Procedure: CYSTOSCOPY FLEXIBLE;  Surgeon: Marissa Nestle, MD;  Location: [facility];  Service: Urology;  Laterality: N/A;  . Transurethral  resection of bladder tumor  10/01/2011    Procedure: TRANSURETHRAL RESECTION OF BLADDER TUMOR (TURBT);  Surgeon: Marissa Nestle, MD;  Location: [facility];  Service: Urology;  Laterality: N/A;  . Cardiac catheterization  2003    [facility]  . Fracture surgery      R ring finger, pin placed   Family History  Problem Relation Age of Onset  . Emphysema Brother     was not close to family; does not truly know their medical problems.  . Emphysema Brother   . Emphysema Brother   . Cancer Neg Hx    History  Substance Use Topics  . Smoking status: Current Every Day Smoker -- 1.00 packs/day for 58 years    Types: Cigarettes  . Smokeless tobacco: Not on file  . Alcohol Use: No    Review of Systems  Constitutional: Negative for appetite change and fatigue.  HENT: Negative for congestion, ear discharge and sinus pressure.   Eyes: Negative for discharge.  Respiratory: Positive for shortness of breath and wheezing. Negative for cough.   Cardiovascular: Negative for chest pain.  Gastrointestinal: Negative for abdominal pain and diarrhea.  Genitourinary: Negative for frequency and hematuria.  Musculoskeletal: Negative for back pain.  Skin: Negative for rash.  Neurological: Negative for seizures and headaches.  Psychiatric/Behavioral: Negative for hallucinations.    Allergies  Iohexol and Ivp dye  Home Medications   Prior  to Admission medications   Medication Sig Start Date End Date Taking? Authorizing Provider  albuterol (PROVENTIL) (2.5 MG/3ML) 0.083% nebulizer solution Take 2.5 mg by nebulization every 4 (four) hours as needed. For shortness of breath    Historical Provider, MD  albuterol-ipratropium (COMBIVENT) 18-103 MCG/ACT inhaler Inhale 2 puffs into the lungs 4 (four) times daily. 03/30/13   Rexene Alberts, MD  ALPRAZolam Duanne Moron) 1 MG tablet Take 0.5-1 mg by mouth 2 (two) times daily as needed for anxiety. anxiety 02/20/13   Domenic Polite, MD  aspirin EC 81 MG tablet Take 81 mg by mouth  every morning.  10/02/11   Rexene Alberts, MD  budesonide-formoterol [facility]) 160-4.5 MCG/ACT inhaler Inhale 2 puffs into the lungs 2 (two) times daily. 03/30/13   Rexene Alberts, MD  Cholecalciferol (VITAMIN D3) 1000 UNITS CAPS Take 1 capsule by mouth every morning.     Historical Provider, MD  DALIRESP 500 MCG TABS tablet Take 500 mcg by mouth daily. 01/26/14   Historical Provider, MD  Garlic 6440 MG CAPS Take 1 capsule by mouth every morning.     Historical Provider, MD  HYDROcodone-acetaminophen (NORCO) 10-325 MG per tablet Take 1 tablet by mouth every 6 (six) hours as needed. For pain 06/14/13   Historical Provider, MD  Multiple Vitamins-Minerals (CENTRUM) tablet Take 1 tablet by mouth every morning.     Historical Provider, MD  NEXIUM 40 MG capsule Take 1 capsule by mouth daily. 01/15/14   Historical Provider, MD  NITROSTAT 0.4 MG SL tablet Place 1 tablet under the tongue as needed for chest pain.  02/22/13   Historical Provider, MD  polyethylene glycol powder (GLYCOLAX/MIRALAX) powder Take 17 g by mouth 2 (two) times daily.    Historical Provider, MD  predniSONE (DELTASONE) 10 MG tablet Take 4 tablets (40 mg total) by mouth daily. 01/20/14   Fredia Sorrow, MD  simvastatin (ZOCOR) 40 MG tablet Take 40 mg by mouth every morning.     Historical Provider, MD  tamsulosin (FLOMAX) 0.4 MG CAPS capsule Take 0.4 mg by mouth every morning.  07/06/13   Historical Provider, MD   BP 104/65  Pulse 87  Temp(Src) 97.7 F (36.5 C)  Resp 19  Ht 5\' 5"  (1.651 m)  Wt 171 lb (77.565 kg)  BMI 28.46 kg/m2  SpO2 95% Physical Exam  Constitutional: He is oriented to person, place, and time. He appears well-developed.  HENT:  Head: Normocephalic.  Eyes: Conjunctivae and EOM are normal. No scleral icterus.  Neck: Neck supple. No thyromegaly present.  Cardiovascular: Normal rate and regular rhythm.  Exam reveals no gallop and no friction rub.   No murmur heard. Pulmonary/Chest: No stridor. Respiratory distress:  Mild bilateral wheezes. He has wheezes. He has no rales. He exhibits no tenderness.  Abdominal: He exhibits no distension. There is no tenderness. There is no rebound.  Musculoskeletal: Normal range of motion. He exhibits no edema.  Lymphadenopathy:    He has no cervical adenopathy.  Neurological: He is oriented to person, place, and time. He exhibits normal muscle tone. Coordination normal.  Skin: No rash noted. No erythema.  Psychiatric: He has a normal mood and affect. His behavior is normal.    ED Course  Procedures   DIAGNOSTIC STUDIES: Oxygen Saturation is 95% on RA, low by my interpretation.    COORDINATION OF CARE: 3:54 PM Discussed treatment plan with pt at bedside and pt agreed to plan.   Labs Review Labs Reviewed - No data to display  Imaging Review  Dg Chest 2 View  02/09/2014   CLINICAL DATA:  Shortness of breath, productive cough for 3 days. Brown yellow sputum. History of COPD, asthma.  EXAM: CHEST  2 VIEW  COMPARISON:  01/20/2014  FINDINGS: There is hyperinflation of the lungs compatible with COPD. Stable mild interstitial prominence, likely mild chronic interstitial changes. Heart is normal size. No confluent opacities or effusions. No acute bony abnormality.  IMPRESSION: COPD/chronic interstitial changes.  No acute findings.   Electronically Signed   By: Rolm Baptise M.D.   On: 02/09/2014 15:28     EKG Interpretation   Date/Time:  Wednesday February 09 2014 14:30:42 EDT Ventricular Rate:  91 PR Interval:  189 QRS Duration: 72 QT Interval:  348 QTC Calculation: 428 R Axis:   91 Text Interpretation:  Right and left arm electrode reversal,  interpretation assumes no reversal Sinus rhythm Right axis deviation  Minimal ST elevation, inferior leads Baseline wander in lead(s) II  Confirmed by Kaesyn Johnston  MD, Na Waldrip [phone]) on 02/09/2014 7:43:17 PM      MDM   Final diagnoses:  SOB (shortness of breath)    Admit copd  The chart was scribed for me under my direct  supervision.  I personally performed the history, physical, and medical decision making and all procedures in the evaluation of this patient.Maudry Diego, MD 02/09/14 [phone]

## 2014-02-09 NOTE — Progress Notes (Signed)
Patient's O2 sats were 84%, RT increased O2 and put the patient on cpap, Paged on-call MD to make him aware of patient's status, we are going to continue to monitor this patient on cpap, I will notify the MD and RT if there are any changes in the patients status.

## 2014-02-09 NOTE — H&P (Signed)
[facility] History and Physical  NYCHOLAS RAYNER LGX:[id number] DOB: July 04, 1940 DOA: 02/09/2014  Referring physician: Dr Roderic Palau - [facility] PCP: [name], PA-C   Chief Complaint: "losing oxygen'  HPI: [name] [name] is a 73 y.o. male  On oxygen at home, 2.5L. Increased to 3L today. Began feeling SOB this morning. Felt weak on the legs. Worse with ambulation. Pt did not take any additional breathing treatments to try and stop the attack. "Stopped smoking today." 1ppd.    Review of Systems:  Constitutional:  No weight loss, night sweats, Fevers, chills, fatigue.  HEENT:  No headaches, Difficulty swallowing,Tooth/dental problems,Sore throat,  No sneezing, itching, ear ache, nasal congestion, post nasal drip,  Cardio-vascular:  No chest pain, Orthopnea, PND, swelling in lower extremities, anasarca, dizziness, palpitations  GI:  No heartburn, indigestion, abdominal pain, nausea, vomiting, diarrhea, change in bowel habits, loss of appetite  Resp:  SOB at rest. No excess mucus, no productive cough,  Skin:  Intact and warm.  GU:  no dysuria, change in color of urine, no urgency or frequency. No flank pain.  Musculoskeletal:  No joint pain or swelling. No decreased range of motion. No back pain.  Psych:  No change in mood or affect. No depression or anxiety. No memory loss.   Past Medical History  Diagnosis Date  . COPD (chronic obstructive pulmonary disease)   . Asthma   . Tobacco abuse 09/29/2011  . Hypercholesteremia   . GERD (gastroesophageal reflux disease)   . Headache(784.0)   . BPH (benign prostatic hyperplasia) 10/01/2011    Per cystoscopy  . 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  . Pulmonary nodule 09/2011    Stable appearance  . Chronic back pain   . Chronic anxiety   . Hyperlipidemia 03/13/2012  . Anxiety 03/13/2012  . On home O2     2L N/C continuous  . Emphysema lung    Past Surgical History  Procedure Laterality Date  .  Cystoscopy  09/30/2011    Procedure: CYSTOSCOPY FLEXIBLE;  Surgeon: Marissa Nestle, MD;  Location: [facility];  Service: Urology;  Laterality: N/A;  . Transurethral resection of bladder tumor  10/01/2011    Procedure: TRANSURETHRAL RESECTION OF BLADDER TUMOR (TURBT);  Surgeon: Marissa Nestle, MD;  Location: [facility];  Service: Urology;  Laterality: N/A;  . Cardiac catheterization  2003    [facility]  . Fracture surgery      R ring finger, pin placed   Social History:  reports that he has been smoking Cigarettes.  He has a 58 pack-year smoking history. He does not have any smokeless tobacco history on file. He reports that he does not drink alcohol or use illicit drugs.  Allergies  Allergen Reactions  . Iohexol Hives     Code: HIVES, Desc: PT STATES HE BROKE OUT IN HIVES AND RASH AFTER CT NECK EARLY SEPT 2011; NO RESP PROBLEMS; NEEDS PRE-MEDS; MKS, Onset Date: [id number]   . Ivp Dye [Iodinated Diagnostic Agents]     Family History  Problem Relation Age of Onset  . Emphysema Brother     was not close to family; does not truly know their medical problems.  . Emphysema Brother   . Emphysema Brother   . Cancer Neg Hx      Prior to Admission medications   Medication Sig Start Date End Date Taking? Authorizing Provider  albuterol (PROVENTIL) (2.5 MG/3ML) 0.083% nebulizer solution Take 2.5 mg by nebulization every 4 (four) hours  as needed. For shortness of breath   Yes Historical Provider, MD  albuterol-ipratropium (COMBIVENT) 18-103 MCG/ACT inhaler Inhale 2 puffs into the lungs 4 (four) times daily. 03/30/13  Yes Rexene Alberts, MD  ALPRAZolam Duanne Moron) 1 MG tablet Take 0.5-1 mg by mouth 2 (two) times daily as needed for anxiety. anxiety 02/20/13  Yes Domenic Polite, MD  aspirin EC 81 MG tablet Take 81 mg by mouth every morning.  10/02/11  Yes Rexene Alberts, MD  budesonide-formoterol [facility]) 160-4.5 MCG/ACT inhaler Inhale 2 puffs into the lungs 2 (two) times daily. 03/30/13  Yes Rexene Alberts, MD    Cholecalciferol (VITAMIN D3) 1000 UNITS CAPS Take 1 capsule by mouth every morning.    Yes Historical Provider, MD  Cyanocobalamin (B-12) 2500 MCG TABS Take 1 tablet by mouth daily.   Yes Historical Provider, MD  DALIRESP 500 MCG TABS tablet Take 500 mcg by mouth daily. 01/26/14  Yes Historical Provider, MD  esomeprazole (NEXIUM) 40 MG capsule Take 40 mg by mouth every morning.   Yes Historical Provider, MD  Garlic 8657 MG CAPS Take 1 capsule by mouth every morning.    Yes Historical Provider, MD  HYDROcodone-acetaminophen (NORCO) 10-325 MG per tablet Take 1 tablet by mouth every 6 (six) hours as needed. For pain 06/14/13  Yes Historical Provider, MD  loratadine (CLARITIN) 10 MG tablet Take 10 mg by mouth every morning.   Yes Historical Provider, MD  Multiple Vitamin (MULTIVITAMIN WITH MINERALS) TABS tablet Take 1 tablet by mouth daily.   Yes Historical Provider, MD  NITROSTAT 0.4 MG SL tablet Place 1 tablet under the tongue as needed for chest pain.  02/22/13  Yes Historical Provider, MD  polyethylene glycol powder (GLYCOLAX/MIRALAX) powder Take 17 g by mouth daily as needed for mild constipation or moderate constipation.    Yes Historical Provider, MD  simvastatin (ZOCOR) 40 MG tablet Take 40 mg by mouth every morning.    Yes Historical Provider, MD  tamsulosin (FLOMAX) 0.4 MG CAPS capsule Take 0.4 mg by mouth every morning.  07/06/13  Yes Historical Provider, MD   Physical Exam: Filed Vitals:   02/09/14 1930 02/09/14 2000 02/09/14 2030 02/09/14 2114  BP: 127/71 128/86 128/78 107/80  Pulse: 94 96 92 94  Temp:    98.5 F (36.9 C)  TempSrc:    Oral  Resp:    20  Height:    5\' 5"  (1.651 m)  Weight:    77.202 kg (170 lb 3.2 oz)  SpO2: 91% 89% 91% 90%    Wt Readings from Last 3 Encounters:  02/09/14 77.202 kg (170 lb 3.2 oz)  01/20/14 77.565 kg (171 lb)  11/07/13 78.6 kg (173 lb 4.5 oz)    General: Appears calm and comfortable Eyes:  PERRL, normal lids, irises & conjunctiva ENT:   grossly normal hearing, lips & tongue Neck:  no LAD, masses or thyromegaly Cardiovascular: faint heart sounds no m/r/g. No LE edema. Telemetry: SR, no arrhythmias  Respiratory: diffuse wheezing w/o crackles or ronchi. Normal respiratory effort. On 4.5L [place] Abdomen:  soft, ntnd Skin: diffuse ecchymosis.  no rash or induration seen on limited exam Musculoskeletal:  grossly normal tone BUE/BLE Psychiatric:  grossly normal mood and affect, speech fluent and appropriate Neurologic:  grossly non-focal.          Labs on Admission:  Basic Metabolic Panel:  Recent Labs Lab 02/09/14 1611  NA 142  K 4.5  CL 97  CO2 39*  GLUCOSE 111*  BUN 9  CREATININE  0.78  CALCIUM 9.9   Liver Function Tests: No results found for this basename: AST, ALT, ALKPHOS, BILITOT, PROT, ALBUMIN,  in the last 168 hours No results found for this basename: LIPASE, AMYLASE,  in the last 168 hours No results found for this basename: AMMONIA,  in the last 168 hours CBC:  Recent Labs Lab 02/09/14 1611  WBC 10.7*  NEUTROABS 6.4  HGB 13.5  HCT 42.8  MCV 97.3  PLT 233   Cardiac Enzymes: No results found for this basename: CKTOTAL, CKMB, CKMBINDEX, TROPONINI,  in the last 168 hours  BNP (last 3 results)  Recent Labs  07/30/13 1810 02/09/14 1611  PROBNP 101.9 115.9   CBG: No results found for this basename: GLUCAP,  in the last 168 hours  Radiological Exams on Admission: Dg Chest 2 View  02/09/2014   CLINICAL DATA:  Shortness of breath, productive cough for 3 days. Brown yellow sputum. History of COPD, asthma.  EXAM: CHEST  2 VIEW  COMPARISON:  01/20/2014  FINDINGS: There is hyperinflation of the lungs compatible with COPD. Stable mild interstitial prominence, likely mild chronic interstitial changes. Heart is normal size. No confluent opacities or effusions. No acute bony abnormality.  IMPRESSION: COPD/chronic interstitial changes.  No acute findings.   Electronically Signed   By: Rolm Baptise M.D.   On:  02/09/2014 15:28    EKG: Independently reviewed. NSR, no change from previous  Assessment/Plan Active Problems:   COPD (chronic obstructive pulmonary disease) respiratory acidosis Tobacco abuse GERD BPH Anxiety HLD BPH  COPD: ABG on admission pH 7.323, CO2 66.9 and O2 54.2, Bicarb 33.7. Previous ABGs reviewed adn this appears to be more elevated for pt though pt likely is a chronic CO2 retainer. This is a mild exacerbation. Pt w/ an increased O2 requirement but no productive cough. Likely more of an asthma exacerbation. Given solumedrol 125 and 2 breathing treatment in the ED prior to admission.  - Admit - Obs - Solumedrol 60mg  Q6 (consider changing to Prednisone Qday in the am ) - Duonebs Q4 - Tobacco cessation as below - hold Doxy unless develops cough - CPAP prn - continue home Daliresp  Tobacco: Pt states he's going to quit for sure this time. Reasurance given - nicotine patch - cessation counseling  GERD: well controlled - continue PPI  BPH: no urinary retention or hesitency - continue flomax  Anxiety:  - continue PRN Xanax  Code Status: FULL DVT Prophylaxis: Hep [place] TID Family Communication: none Disposition Plan: pending improvement    Andalyn Heckstall, [facility] [email or web address] Password [email or web address]

## 2014-02-09 NOTE — ED Notes (Addendum)
Pt sat 82% on 3L [place]. Pt. O2 increased to 5L . Pt. Instructed to take deep breaths. Pt. O2 90%. Dr. Roderic Palau notified.

## 2014-02-10 ENCOUNTER — Encounter (HOSPITAL_COMMUNITY): Payer: Self-pay

## 2014-02-10 DIAGNOSIS — R0602 Shortness of breath: Secondary | ICD-10-CM

## 2014-02-10 LAB — BLOOD GAS, ARTERIAL
Acid-Base Excess: 7.5 mmol/L — ABNORMAL HIGH (ref 0.0–2.0)
BICARBONATE: 32.5 meq/L — AB (ref 20.0–24.0)
DRAWN BY: 27407
FIO2: 0.55 %
O2 Saturation: 87.8 %
Patient temperature: 37
TCO2: 29 mmol/L (ref 0–100)
pCO2 arterial: 55.2 mmHg — ABNORMAL HIGH (ref 35.0–45.0)
pH, Arterial: 7.387 (ref 7.350–7.450)
pO2, Arterial: 51.6 mmHg — ABNORMAL LOW (ref 80.0–100.0)

## 2014-02-10 LAB — BASIC METABOLIC PANEL
Anion gap: 7 (ref 5–15)
BUN: 11 mg/dL (ref 6–23)
CHLORIDE: 99 meq/L (ref 96–112)
CO2: 35 mEq/L — ABNORMAL HIGH (ref 19–32)
Calcium: 10 mg/dL (ref 8.4–10.5)
Creatinine, Ser: 0.68 mg/dL (ref 0.50–1.35)
GFR calc Af Amer: >90 mL/min (ref >90)
GFR calc non Af Amer: >90 mL/min (ref >90)
GLUCOSE: 186 mg/dL — AB (ref 70–99)
POTASSIUM: 4.8 meq/L (ref 3.7–5.3)
SODIUM: 141 meq/L (ref 137–147)

## 2014-02-10 LAB — CBC
HCT: 40.1 % (ref 39.0–52.0)
HEMOGLOBIN: 13.1 g/dL (ref 13.0–17.0)
MCH: 31.2 pg (ref 26.0–34.0)
MCHC: 32.7 g/dL (ref 30.0–36.0)
MCV: 95.5 fL (ref 78.0–100.0)
Platelets: 223 10*3/uL (ref 150–400)
RBC: 4.2 MIL/uL — ABNORMAL LOW (ref 4.22–5.81)
RDW: 13.5 % (ref 11.5–15.5)
WBC: 7.9 10*3/uL (ref 4.0–10.5)

## 2014-02-10 LAB — MRSA PCR SCREENING: MRSA by PCR: NEGATIVE

## 2014-02-10 MED ORDER — CHLORHEXIDINE GLUCONATE 0.12 % MT SOLN
15.0000 mL | Freq: Two times a day (BID) | OROMUCOSAL | Status: DC
  Administered 2014-02-10 – 2014-02-13 (×5): 15 mL via OROMUCOSAL
  Filled 2014-02-10 (×5): qty 15

## 2014-02-10 MED ORDER — CETYLPYRIDINIUM CHLORIDE 0.05 % MT LIQD
7.0000 mL | Freq: Two times a day (BID) | OROMUCOSAL | Status: DC
  Administered 2014-02-10 – 2014-02-12 (×4): 7 mL via OROMUCOSAL

## 2014-02-10 MED ORDER — DEXTROSE-NACL 5-0.45 % IV SOLN
INTRAVENOUS | Status: DC
  Administered 2014-02-10 – 2014-02-12 (×3): via INTRAVENOUS

## 2014-02-10 MED ORDER — DOXYCYCLINE HYCLATE 100 MG PO TABS
100.0000 mg | ORAL_TABLET | Freq: Two times a day (BID) | ORAL | Status: DC
  Administered 2014-02-10 – 2014-02-13 (×7): 100 mg via ORAL
  Filled 2014-02-10 (×7): qty 1

## 2014-02-10 MED ORDER — ALPRAZOLAM 1 MG PO TABS
1.0000 mg | ORAL_TABLET | Freq: Three times a day (TID) | ORAL | Status: DC
  Administered 2014-02-10 – 2014-02-13 (×9): 1 mg via ORAL
  Filled 2014-02-10 (×5): qty 2
  Filled 2014-02-10: qty 1
  Filled 2014-02-10: qty 2
  Filled 2014-02-10 (×2): qty 1

## 2014-02-10 MED ORDER — ALBUTEROL SULFATE (2.5 MG/3ML) 0.083% IN NEBU: 5.0000 mg | INHALATION_SOLUTION | RESPIRATORY_TRACT | Status: DC

## 2014-02-10 MED ORDER — MOMETASONE FURO-FORMOTEROL FUM 200-5 MCG/ACT IN AERO
2.0000 | INHALATION_SPRAY | Freq: Two times a day (BID) | RESPIRATORY_TRACT | Status: DC
  Administered 2014-02-11 – 2014-02-13 (×5): 2 via RESPIRATORY_TRACT
  Filled 2014-02-10: qty 8.8

## 2014-02-10 NOTE — Care Management Utilization Note (Signed)
UR completed 

## 2014-02-10 NOTE — Progress Notes (Signed)
[name] [name] CXK:[id number] DOB: 1940-09-27 DOA: 02/09/2014 PCP: [name], PA-C  Brief narrative: 73 y/o ? smoker, severe COPD with recent exacerbation 10/2013, steroid induced hyperglycemia, high grade urothelial ca bladder s/p TUR, chronic pain, prior episodic hemoptysis, Cardiac cath 2005 non-osbt, pulm nodule  Past medical history-As per Problem list Chart reviewed as below- Reviewed  Consultants:  None  Procedures:  Chest x-ray  Antibiotics:  Doxycycline 10/29   Subjective  Overnight events noted Patient still working hard to breathe on 15 L nonrebreather but able to verbalize and talk Thinks that his issues started with change of season and then a resultant cold Still smokes less than half pack a day No chest pain No lower extremity swelling No unilateral weakness   Objective    Interim History:   Telemetry: Sinus tachycardia   Objective: Filed Vitals:   02/09/14 2336 02/10/14 0354 02/10/14 0431 02/10/14 0651  BP:   126/64   Pulse: 90  85   Temp:   98.5 F (36.9 C)   TempSrc:   Oral   Resp: 18  18   Height:      Weight:      SpO2: 90% 85% 92% 89%   No intake or output data in the 24 hours ending 02/10/14 0909  Exam:  General: EOMI NCAT Cardiovascular: S1-S2 no murmur rub or gallop Respiratory: Wheeze and rales throughout lung fields posteriorly and anteriorly with increased work of breathing,~breathing 30 times a minute Abdomen: Soft nontender Skin no lower extremity edema Neuro grossly intact  Data Reviewed: Basic Metabolic Panel:  Recent Labs Lab 02/09/14 1611 02/10/14 0535  NA 142 141  K 4.5 4.8  CL 97 99  CO2 39* 35*  GLUCOSE 111* 186*  BUN 9 11  CREATININE 0.78 0.68  CALCIUM 9.9 10.0   Liver Function Tests: No results found for this basename: AST, ALT, ALKPHOS, BILITOT, PROT, ALBUMIN,  in the last 168 hours No results found for this basename: LIPASE, AMYLASE,  in the last 168 hours No results found for this  basename: AMMONIA,  in the last 168 hours CBC:  Recent Labs Lab 02/09/14 1611 02/10/14 0535  WBC 10.7* 7.9  NEUTROABS 6.4  --   HGB 13.5 13.1  HCT 42.8 40.1  MCV 97.3 95.5  PLT 233 223   Cardiac Enzymes: No results found for this basename: CKTOTAL, CKMB, CKMBINDEX, TROPONINI,  in the last 168 hours BNP: No components found with this basename: POCBNP,  CBG: No results found for this basename: GLUCAP,  in the last 168 hours  No results found for this or any previous visit (from the past 240 hour(s)).   Studies:              All Imaging reviewed and is as per above notation   Scheduled Meds: . antiseptic oral rinse  7 mL Mouth Rinse q12n4p  . aspirin EC  81 mg Oral q morning - 10a  . chlorhexidine  15 mL Mouth Rinse BID  . heparin  5,000 Units Subcutaneous 3 times per day  . ipratropium-albuterol  3 mL Nebulization Q4H  . methylPREDNISolone (SOLU-MEDROL) injection  60 mg Intravenous Q6H  . nicotine  21 mg Transdermal Daily  . pantoprazole  80 mg Oral Q1200  . roflumilast  500 mcg Oral Daily  . senna  1 tablet Oral BID  . simvastatin  40 mg Oral q morning - 10a  . tamsulosin  0.4 mg Oral q morning - 10a  Continuous Infusions:    Assessment/Plan: 1. Acute mixed Respiratory failure-patient to be sent to the stepdown unit for continuous BiPAP. Repeat ABG and reassess in a.m.. 2. Severe COPD still smoker-continue ipratropium nebs 3 minutes every 4 hourly, Solu-Medrol 60 mg every 6 hourly. hepatic every 4 hourly neb albuterol 5 mg. He will also continue his Roflumist 500 mcg daily.  Will add LABA-long acting steorid Dulera as well to meds.  Started doxycycline as he is having sputum  3.  smoker-patient is now on nicotine patch as he gets hallucinations. We'll withhold this. 4. History urothelial cancer-continue Flomax. Outpatient follow-up with urologist 5. History pulmonary nodule-recent CT scan 10/2013 1 new nodule in left lower lobe 4 mm-needs one-year follow-up 6. Fen-keep  NPO for now.  Re-evalaute in pm and may be able to give diet.  Saline 50 cc/hr 7. Contraction alkalosis-monitor  Code Status: [place] Family Communication: None present at bedside Disposition plan:  Transfer to step down unit.   Verneita Griffes, MD  [facility] Pager [phone] 02/10/2014, 9:09 AM    LOS: 1 day

## 2014-02-10 NOTE — Progress Notes (Signed)
Late entry:  Patient requested to notify his significant other Shirlean Mylar) whose name is on chart.  Called several times.  Left message to call on cell phone.

## 2014-02-10 NOTE — Plan of Care (Signed)
Problem: ICU Phase Progression Outcomes Goal: O2 sats trending toward baseline Outcome: Progressing Patient on Bipap tonight, tolerating well Goal: Pain controlled with appropriate interventions Outcome: Progressing Chronic back pain being relieved by ordered medication

## 2014-02-10 NOTE — Progress Notes (Signed)
Inpatient Diabetes Program Recommendations  AACE/ADA: New Consensus Statement on Inpatient Glycemic Control (2013)  Target Ranges:  Prepandial:   less than 140 mg/dL      Peak postprandial:   less than 180 mg/dL (1-2 hours)      Critically ill patients:  140 - 180 mg/dL   Results for ASHTEN, PRATS (MRN [id number]) as of 02/10/2014 08:41  Ref. Range 02/09/2014 16:11 02/10/2014 05:35  Glucose Latest Range: 70-99 mg/dL 111 (H) 186 (H)   Diabetes history: No  Outpatient Diabetes medications: NA Current orders for Inpatient glycemic control: None  Inpatient Diabetes Program Recommendations Correction (SSI): Patient has no documented history of diabetes. Fasting glucose 186 mg/dl this morning likely from steroids. While inpatient and ordered steroids, may want to consider ordering CBGs with Novolog correction if needed.  Thanks, Barnie Alderman, RN, MSN, CCRN Diabetes Coordinator Inpatient Diabetes Program [phone] (Team Pager) [phone] ([facility] office) [phone] [facility] office)

## 2014-02-10 NOTE — Progress Notes (Signed)
Dr. Verlon Au notified of ABG results via text page.

## 2014-02-11 LAB — BASIC METABOLIC PANEL
Anion gap: 10 (ref 5–15)
BUN: 16 mg/dL (ref 6–23)
CALCIUM: 9.9 mg/dL (ref 8.4–10.5)
CO2: 31 mEq/L (ref 19–32)
Chloride: 97 mEq/L (ref 96–112)
Creatinine, Ser: 0.66 mg/dL (ref 0.50–1.35)
GFR calc non Af Amer: >90 mL/min (ref >90)
GLUCOSE: 215 mg/dL — AB (ref 70–99)
POTASSIUM: 3.9 meq/L (ref 3.7–5.3)
SODIUM: 138 meq/L (ref 137–147)

## 2014-02-11 LAB — BLOOD GAS, ARTERIAL
ACID-BASE EXCESS: 5.7 mmol/L — AB (ref 0.0–2.0)
Bicarbonate: 29.8 mEq/L — ABNORMAL HIGH (ref 20.0–24.0)
Drawn by: 22179
FIO2: 45 %
O2 SAT: 91.6 %
PATIENT TEMPERATURE: 37
PO2 ART: 62.1 mmHg — AB (ref 80.0–100.0)
TCO2: 26.6 mmol/L (ref 0–100)
pCO2 arterial: 44.6 mmHg (ref 35.0–45.0)
pH, Arterial: 7.44 (ref 7.350–7.450)

## 2014-02-11 NOTE — Care Management Note (Unsigned)
    Page 1 of 1   02/11/2014     2:05:19 PM CARE MANAGEMENT NOTE 02/11/2014  Patient:  [name] [name], [name] [name]   Account Number:  [id number]  Date Initiated:  02/11/2014  Documentation initiated by:  Theophilus Kinds  Subjective/Objective Assessment:   Pt admitted from home with acute respiratory failure. Pt lives with his significant other and will return home at discharge. Pt statd that he is independent with ADL's. Pt has home O2 with [employer] and has a neb machine.     Action/Plan:   Will continue to follow for discharge planning needs.   Anticipated DC Date:  02/14/2014   Anticipated DC Plan:  [place]  CM consult      Choice offered to / List presented to:             Status of service:  In process, will continue to follow Medicare Important Message given?  YES (If response is "NO", the following Medicare IM given date fields will be blank) Date Medicare IM given:  02/11/2014 Medicare IM given by:  Theophilus Kinds Date Additional Medicare IM given:   Additional Medicare IM given by:    Discharge Disposition:    Per UR Regulation:    If discussed at Long Length of Stay Meetings, dates discussed:    Comments:  02/11/14 [place], RN BSN CM

## 2014-02-11 NOTE — Progress Notes (Signed)
[name] [name]:[id number] DOB: Sep 18, 1940 DOA: 02/09/2014 PCP: [name], PA-C  Brief narrative: 73 y/o ? 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  Tolerated nonRB overnight to some extent.   WOB seems less No CP or n/v   Objective    Interim History:   Telemetry: Sinus tachycardia   Objective: Filed Vitals:   02/11/14 0536 02/11/14 0700 02/11/14 0737 02/11/14 0751  BP:  132/70    Pulse: 88 75    Temp:    97.2 F (36.2 C)  TempSrc:    Oral  Resp: 28 26    Height:      Weight:      SpO2: 88% 89% 91%     Intake/Output Summary (Last 24 hours) at 02/11/14 3532 Last data filed at 02/11/14 0900  Gross per 24 hour  Intake 1623.33 ml  Output    650 ml  Net 973.33 ml    Exam:  General: EOMI NCAT Cardiovascular: S1-S2 no murmur rub or gallop Respiratory: Wheezes diminished Abdomen: Soft nontender Skin no lower extremity edema Neuro grossly intact  Data Reviewed: Basic Metabolic Panel:  Recent Labs Lab 02/09/14 1611 02/10/14 0535 02/11/14 0535  NA 142 141 138  K 4.5 4.8 3.9  CL 97 99 97  CO2 39* 35* 31  GLUCOSE 111* 186* 215*  BUN 9 11 16   CREATININE 0.78 0.68 0.66  CALCIUM 9.9 10.0 9.9   Liver Function Tests: No results found for this basename: AST, ALT, ALKPHOS, BILITOT, PROT, ALBUMIN,  in the last 168 hours No results found for this basename: LIPASE, AMYLASE,  in the last 168 hours No results found for this basename: AMMONIA,  in the last 168 hours CBC:  Recent Labs Lab 02/09/14 1611 02/10/14 0535  WBC 10.7* 7.9  NEUTROABS 6.4  --   HGB 13.5 13.1  HCT 42.8 40.1  MCV 97.3  95.5  PLT 233 223   Cardiac Enzymes: No results found for this basename: CKTOTAL, CKMB, CKMBINDEX, TROPONINI,  in the last 168 hours BNP: No components found with this basename: POCBNP,  CBG: No results found for this basename: GLUCAP,  in the last 168 hours  Recent Results (from the past 240 hour(s))  MRSA PCR SCREENING     Status: None   Collection Time    02/10/14 11:00 AM      Result Value Ref Range Status   MRSA by PCR NEGATIVE  NEGATIVE Final   Comment:            The GeneXpert MRSA Assay (FDA     approved for NASAL specimens     only), is one component of a     comprehensive MRSA colonization     surveillance program. It is not     intended to diagnose MRSA     infection nor to guide or     monitor treatment for     MRSA infections.     Studies:              All Imaging reviewed and is as per above notation   Scheduled Meds: . ALPRAZolam  1 mg Oral TID  . antiseptic oral rinse  7 mL Mouth Rinse q12n4p  . aspirin EC  81 mg Oral q morning - 10a  . chlorhexidine  15 mL Mouth Rinse BID  . doxycycline  100 mg Oral Q12H  . heparin  5,000 Units Subcutaneous 3 times per day  . ipratropium-albuterol  3 mL Nebulization Q4H  . methylPREDNISolone (SOLU-MEDROL) injection  60 mg Intravenous Q6H  . mometasone-formoterol  2 puff Inhalation BID  . pantoprazole  80 mg Oral Q1200  . roflumilast  500 mcg Oral Daily  . senna  1 tablet Oral BID  . simvastatin  40 mg Oral q morning - 10a  . tamsulosin  0.4 mg Oral q morning - 10a   Continuous Infusions: . dextrose 5 % and 0.45% NaCl 50 mL/hr at 02/11/14 0906     Assessment/Plan:  1. Acute mixed Respiratory failure-2/2 to COPD exacerbation in current smoker.  He is poorly compliant with Respiratory recs in terms of Bipap and other measures but is improving slowly 2. Severe COPD still smoker-continue ipratropium nebs 3 minutes every 4 hourly, Solu-Medrol 60 mg every 6 hourly. Q4 hourly neb albuterol 5 mg. He will also continue  his Roflumist 500 mcg daily.  Will add LABA-long acting steorid Dulera as well to meds.  Conitnuedoxycycline as he is having sputum 3.  smoker-patient is now on nicotine patch as he gets hallucinations. We'll withhold this.  Have counseled him strongly once again risk of smoking and he understands 4. History urothelial cancer-continue Flomax. Outpatient follow-up with urologist 5. History pulmonary nodule-recent CT scan 10/2013 1 new nodule in left lower lobe 4 mm-needs one-year follow-up 6. Fen-allow diet in between bipap.  Saline 50 cc/hr for now 7. Steroid induced hyperglycemia-hold Insulins for now.  Expect will resolve 8. Contraction alkalosis-monitor-is resolving  Code Status: Fulll Family Communication: None present at bedside Disposition plan:  Transfer to tele maybe in am.  Monitor on SDU for 1 more night   Verneita Griffes, MD  [facility] Pager [phone] 02/11/2014, 9:22 AM    LOS: 2 days

## 2014-02-12 ENCOUNTER — Encounter (HOSPITAL_COMMUNITY): Payer: Self-pay

## 2014-02-12 MED ORDER — PREDNISONE 20 MG PO TABS
60.0000 mg | ORAL_TABLET | Freq: Every day | ORAL | Status: DC
  Administered 2014-02-12 – 2014-02-13 (×2): 60 mg via ORAL
  Filled 2014-02-12 (×2): qty 3

## 2014-02-12 NOTE — Progress Notes (Signed)
Called report to Charlynn Court, RN.  Verbalized understanding.  Pt transferred to floor in safe and stable condition. Sch[place], Eulis Canner 02/12/2014

## 2014-02-12 NOTE — Progress Notes (Signed)
Patient taken off of venturi mask of 40% and weaned to  2.5 LPM.  Saturation is 92-95%.  Will continue to monitor.

## 2014-02-12 NOTE — Progress Notes (Signed)
[name] [name] [name]:[id number] DOB: 09/10/1940 DOA: 02/09/2014 PCP: [name], PA-C  Brief narrative: 73 y/o ? 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  He was transferred to the ICU and monitored closely on continued Bipap.  He made mod improvement over the next couple of days  Past medical history-As per Problem list Chart reviewed as below- Reviewed  Consultants:  None  Procedures:  Chest x-ray  Antibiotics:  Doxycycline 10/29   Subjective   Feels close to "9"/10.  [10/10 is the best he can feel] Still producing sputum.  Passing stool denies Cp No n/v No falls or weakness No fever    Objective    Interim History:   Telemetry: Sinus tachycardia   Objective: Filed Vitals:   02/12/14 0500 02/12/14 0600 02/12/14 0700 02/12/14 0704  BP: 134/70 132/72 132/73   Pulse: 78 73 85   Temp: 96.5 F (35.8 C)  97.5 F (36.4 C)   TempSrc: Axillary  Oral   Resp: 26 30 25    Height:      Weight:      SpO2: 96% 97% 87% 85%    Intake/Output Summary (Last 24 hours) at 02/12/14 0807 Last data filed at 02/12/14 0600  Gross per 24 hour  Intake   1580 ml  Output    400 ml  Net   1180 ml    Exam:  General: EOMI NCAT Cardiovascular: S1-S2 no murmur rub or gallop Respiratory: Wheezes diminished, but present lower posterior lobes Abdomen: Soft nontender Skin no lower extremity edema Neuro grossly intact  Data Reviewed: Basic Metabolic Panel:  Recent Labs Lab 02/09/14 1611 02/10/14 0535 02/11/14 0535  NA 142 141 138  K 4.5 4.8 3.9  CL 97 99 97  CO2 39* 35* 31  GLUCOSE 111* 186* 215*  BUN 9 11 16   CREATININE 0.78 0.68 0.66  CALCIUM 9.9 10.0 9.9   Liver Function Tests: No results found for this basename: AST, ALT, ALKPHOS, BILITOT, PROT,  ALBUMIN,  in the last 168 hours No results found for this basename: LIPASE, AMYLASE,  in the last 168 hours No results found for this basename: AMMONIA,  in the last 168 hours CBC:  Recent Labs Lab 02/09/14 1611 02/10/14 0535  WBC 10.7* 7.9  NEUTROABS 6.4  --   HGB 13.5 13.1  HCT 42.8 40.1  MCV 97.3 95.5  PLT 233 223   Cardiac Enzymes: No results found for this basename: CKTOTAL, CKMB, CKMBINDEX, TROPONINI,  in the last 168 hours BNP: No components found with this basename: POCBNP,  CBG: No results found for this basename: GLUCAP,  in the last 168 hours  Recent Results (from the past 240 hour(s))  MRSA PCR SCREENING     Status: None   Collection Time    02/10/14 11:00 AM      Result Value Ref Range Status   MRSA by PCR NEGATIVE  NEGATIVE Final   Comment:            The GeneXpert MRSA Assay (FDA     approved for NASAL specimens     only), is one component of a     comprehensive MRSA colonization     surveillance program. It is not     intended to diagnose MRSA     infection nor to guide or  monitor treatment for     MRSA infections.     Studies:              All Imaging reviewed and is as per above notation   Scheduled Meds: . ALPRAZolam  1 mg Oral TID  . antiseptic oral rinse  7 mL Mouth Rinse q12n4p  . aspirin EC  81 mg Oral q morning - 10a  . chlorhexidine  15 mL Mouth Rinse BID  . doxycycline  100 mg Oral Q12H  . heparin  5,000 Units Subcutaneous 3 times per day  . ipratropium-albuterol  3 mL Nebulization Q4H  . methylPREDNISolone (SOLU-MEDROL) injection  60 mg Intravenous Q6H  . mometasone-formoterol  2 puff Inhalation BID  . pantoprazole  80 mg Oral Q1200  . roflumilast  500 mcg Oral Daily  . senna  1 tablet Oral BID  . simvastatin  40 mg Oral q morning - 10a  . tamsulosin  0.4 mg Oral q morning - 10a   Continuous Infusions: . dextrose 5 % and 0.45% NaCl 50 mL/hr at 02/12/14 0600     Assessment/Plan:  1. Acute mixed Respiratory failure-2/2 to  COPD exacerbation in current smoker.  Improving significantly.  Would attempt to wean O2 from mask 40-50% to [place] 2 liters in next 24 hours.  At present I do not think he requires anything more than [place] oxygen 2. Severe COPD still smoker-continue ipratropium nebs 3 minutes every 4 hourly, Solu-Medrol 60 mg every 6 hourly--> to PO prednisone 60 daily 10/31. Q4 hourly neb albuterol 5 mg. He will also continue his Roflumist 500 mcg daily.  Will add LABA-long acting steroid Dulera as well to meds.  Stop date for doxycycline 02/14/14 3.  smoker-patient is now on nicotine patch as he gets hallucinations. We'll withhold this.  Have counseled him strongly once again risk of smoking and he understands 4. History urothelial cancer-continue Flomax. Outpatient follow-up with urologist 5. History pulmonary nodule-recent CT scan 10/2013 1 new nodule in left lower lobe 4 mm-needs one-year follow-up 6. Fen-d/c IV saline 50 cc/hr 10/28 7. Steroid induced hyperglycemia-hold Insulins for now.  Expect will resolve 8. Contraction alkalosis-monitor-is resolving  Code Status: [place] Family Communication: None present at bedside Disposition plan:  Transfer to tele.     Verneita Griffes, MD  [facility] Pager [phone] 02/12/2014, 8:07 AM    LOS: 3 days

## 2014-02-13 DIAGNOSIS — J441 Chronic obstructive pulmonary disease with (acute) exacerbation: Principal | ICD-10-CM

## 2014-02-13 DIAGNOSIS — J9601 Acute respiratory failure with hypoxia: Secondary | ICD-10-CM

## 2014-02-13 DIAGNOSIS — F419 Anxiety disorder, unspecified: Secondary | ICD-10-CM

## 2014-02-13 MED ORDER — DOXYCYCLINE HYCLATE 100 MG PO TABS: 100.0000 mg | ORAL_TABLET | Freq: Two times a day (BID) | ORAL | Status: DC

## 2014-02-13 MED ORDER — PREDNISONE 20 MG PO TABS: 60.0000 mg | ORAL_TABLET | Freq: Every day | ORAL | Status: DC

## 2014-02-13 NOTE — Plan of Care (Signed)
Pt stated he was ready to go home and he was in no pain.  IV and tele removed.  Pt educated on future diff breathing and how to handle at home.  Pt also instructed when to call Dr. Jaymes Graff return to the hospital.  Pt bringing home O2 from home to use on way home.  Pt will be wheeled to car when ride arrives.

## 2014-02-13 NOTE — Discharge Summary (Signed)
Physician Discharge Summary  [name] [name] GQQ:[id number] DOB: 1940-05-22 DOA: 02/09/2014  PCP: [name], PA-C  Admit date: 02/09/2014 Discharge date: 02/13/2014  Time spent: 25 minutes  Recommendations for Outpatient Follow-up:  1. recommend smoking cessation counseling to be followed up as an outpatient 2. Recommend continuation of doxycycline, steroids for 2-3 more days 3. He will need PFTs as an outpatient 4. Consider repeat CT scan of the chest to rule out cancer as an outpatient in 6 months  Discharge Diagnoses:  Active Problems:   COPD (chronic obstructive pulmonary disease)   Discharge Condition: good provided he can quit smoking  Diet recommendation: heart healthy  Filed Weights   02/09/14 2114 02/10/14 0953 02/11/14 0500  Weight: 77.202 kg (170 lb 3.2 oz) 76.6 kg (168 lb 14 oz) 78.7 kg (173 lb 8 oz)    History of present illness:  73 y/o ? 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 He was transferred to the ICU and monitored closely on continued Bipap. He made mod improvement over the next couple of days  Hospital Course:   1. Acute mixed Respiratory failure-2/2 to COPD exacerbation in current smoker. Iimproved significantlyon discharge. initially on nonrebreather mask 40-50% to [place] 2 litersat baseline he uses oxygen and will need to continue to an half to 3 L as an outpatient 2. Severe COPD still smoker-continue ipratropium nebs 3 minutes every 4 hourly, Solu-Medrol 60 mg every 6 hourly--> to PO prednisone 60 daily 10/31. Q4 hourly neb albuterol 5 mg. He will also continue his Roflumist 500 mcg daily. Will add LABA-long acting steroid Dulera as well to meds. Stop date for doxycycline 02/16/14 3. smoker-patient is not on nicotine patch as he gets  hallucinations. We'll withhold this. Have counseled him strongly once again risk of smoking and he understandsmaintenance 4. History urothelial cancer-continue Flomax. Outpatient follow-up with urologist 5. History pulmonary nodule-recent CT scan 10/2013 1 new nodule in left lower lobe 4 mm-needs one-year follow-up 6. Fen-d/c IV saline 50 cc/hr 10/28 7. Steroid induced hyperglycemia-hold Insulins for now. Expect will resolve 8. Contraction alkalosis-monitor-resolved on discharge 9. Anxiety continue Xanax as an outpatient  Consultants: 10. None  Procedures:  Chest x-ray  Antibiotics:  Doxycycline 10/29--> 02/16/14  Discharge Exam: Filed Vitals:   02/13/14 0620  BP: 134/66  Pulse: 75  Temp: 98.4 F (36.9 C)  Resp: 17    General: alert pleasant oriented no apparent distress Cardiovascular: S1-S2 no murmur rub or gallop c Respiratory: clinically clear  Discharge Instructions You were cared for by a hospitalist during your hospital stay. If you have any questions about your discharge medications or the care you received while you were in the hospital after you are discharged, you can call the unit and asked to speak with the hospitalist on call if the hospitalist that took care of you is not available. Once you are discharged, your primary care physician will handle any further medical issues. Please note that NO REFILLS for any discharge medications will be authorized once you are discharged, as it is imperative that you return to your primary care physician (or establish a relationship with a primary care physician if you do not have one) for your aftercare needs so that they can reassess your need for medications and monitor your lab values.  Discharge Instructions    Diet -  low sodium heart healthy    Complete by:  As directed      Discharge instructions    Complete by:  As directed   Recommend that you continue doxycycline the antibiotic as well as prednisone until 02/16/14. U  will need close follow-up with regular physician for management of COPD. We have strongly recommended that you quit smoking and thatU do not restart.     Increase activity slowly    Complete by:  As directed           Current Discharge Medication List    START taking these medications   Details  doxycycline (VIBRA-TABS) 100 MG tablet Take 1 tablet (100 mg total) by mouth every 12 (twelve) hours. Qty: 4 tablet, Refills: 0    predniSONE (DELTASONE) 20 MG tablet Take 3 tablets (60 mg total) by mouth daily before breakfast. Qty: 9 tablet, Refills: 0      CONTINUE these medications which have NOT CHANGED   Details  albuterol (PROVENTIL) (2.5 MG/3ML) 0.083% nebulizer solution Take 2.5 mg by nebulization every 4 (four) hours as needed. For shortness of breath    albuterol-ipratropium (COMBIVENT) 18-103 MCG/ACT inhaler Inhale 2 puffs into the lungs 4 (four) times daily. Qty: 1 Inhaler, Refills: 12    ALPRAZolam (XANAX) 1 MG tablet Take 0.5-1 mg by mouth 2 (two) times daily as needed for anxiety. anxiety    aspirin EC 81 MG tablet Take 81 mg by mouth every morning.     budesonide-formoterol (SYMBICORT) 160-4.5 MCG/ACT inhaler Inhale 2 puffs into the lungs 2 (two) times daily. Qty: 1 Inhaler, Refills: 12    Cholecalciferol (VITAMIN D3) 1000 UNITS CAPS Take 1 capsule by mouth every morning.     Cyanocobalamin (B-12) 2500 MCG TABS Take 1 tablet by mouth daily.    DALIRESP 500 MCG TABS tablet Take 500 mcg by mouth daily.    esomeprazole (NEXIUM) 40 MG capsule Take 40 mg by mouth every morning.    Garlic 1610 MG CAPS Take 1 capsule by mouth every morning.     HYDROcodone-acetaminophen (NORCO) 10-325 MG per tablet Take 1 tablet by mouth every 6 (six) hours as needed. For pain    Multiple Vitamin (MULTIVITAMIN WITH MINERALS) TABS tablet Take 1 tablet by mouth daily.    NITROSTAT 0.4 MG SL tablet Place 1 tablet under the tongue as needed for chest pain.     polyethylene glycol powder  (GLYCOLAX/MIRALAX) powder Take 17 g by mouth daily as needed for mild constipation or moderate constipation.     simvastatin (ZOCOR) 40 MG tablet Take 40 mg by mouth every morning.     tamsulosin (FLOMAX) 0.4 MG CAPS capsule Take 0.4 mg by mouth every morning.       STOP taking these medications     loratadine (CLARITIN) 10 MG tablet        Allergies  Allergen Reactions  . Iohexol Hives     Code: HIVES, Desc: PT STATES HE BROKE OUT IN HIVES AND RASH AFTER CT NECK EARLY SEPT 2011; NO RESP PROBLEMS; NEEDS PRE-MEDS; MKS, Onset Date: [id number]   . Ivp Dye [Iodinated Diagnostic Agents]       The results of significant diagnostics from this hospitalization (including imaging, microbiology, ancillary and laboratory) are listed below for reference.    Significant Diagnostic Studies: Dg Chest 2 View  02/09/2014   CLINICAL DATA:  Shortness of breath, productive cough for 3 days. Brown yellow sputum. History of COPD, asthma.  EXAM: CHEST  2  VIEW  COMPARISON:  01/20/2014  FINDINGS: There is hyperinflation of the lungs compatible with COPD. Stable mild interstitial prominence, likely mild chronic interstitial changes. Heart is normal size. No confluent opacities or effusions. No acute bony abnormality.  IMPRESSION: COPD/chronic interstitial changes.  No acute findings.   Electronically Signed   By: Rolm Baptise M.D.   On: 02/09/2014 15:28   Dg Chest 2 View  01/20/2014   CLINICAL DATA:  Productive cough for 2 weeks, yellow sputum, shortness of breath, wheezing  EXAM: CHEST  2 VIEW  COMPARISON:  CT chest 11/06/2013, 06/21/2010  FINDINGS: There is right upper lobe fibrosis. There is bilateral chronic interstitial thickening. There is no focal parenchymal opacity, pleural effusion, or pneumothorax. The heart and mediastinal contours are unremarkable.  The osseous structures are unremarkable.  IMPRESSION: No active cardiopulmonary disease.   Electronically Signed   By: Kathreen Devoid   On: 01/20/2014 15:33     Microbiology: Recent Results (from the past 240 hour(s))  MRSA PCR Screening     Status: None   Collection Time: 02/10/14 11:00 AM  Result Value Ref Range Status   MRSA by PCR NEGATIVE NEGATIVE Final    Comment:        The GeneXpert MRSA Assay (FDA approved for NASAL specimens only), is one component of a comprehensive MRSA colonization surveillance program. It is not intended to diagnose MRSA infection nor to guide or monitor treatment for MRSA infections.     Labs: Basic Metabolic Panel:  Recent Labs Lab 02/09/14 1611 02/10/14 0535 02/11/14 0535  NA 142 141 138  K 4.5 4.8 3.9  CL 97 99 97  CO2 39* 35* 31  GLUCOSE 111* 186* 215*  BUN 9 11 16   CREATININE 0.78 0.68 0.66  CALCIUM 9.9 10.0 9.9   Liver Function Tests: No results for input(s): AST, ALT, ALKPHOS, BILITOT, PROT, ALBUMIN in the last 168 hours. No results for input(s): LIPASE, AMYLASE in the last 168 hours. No results for input(s): AMMONIA in the last 168 hours. CBC:  Recent Labs Lab 02/09/14 1611 02/10/14 0535  WBC 10.7* 7.9  NEUTROABS 6.4  --   HGB 13.5 13.1  HCT 42.8 40.1  MCV 97.3 95.5  PLT 233 223   Cardiac Enzymes: No results for input(s): CKTOTAL, CKMB, CKMBINDEX, TROPONINI in the last 168 hours. BNP: BNP (last 3 results)  Recent Labs  07/30/13 1810 02/09/14 1611  PROBNP 101.9 115.9   CBG: No results for input(s): GLUCAP in the last 168 hours.     SignedNita Sells  [facility] 02/13/2014, 11:56 AM

## 2014-02-14 NOTE — Progress Notes (Signed)
UR chart review completed.  

## 2014-02-24 ENCOUNTER — Emergency Department (HOSPITAL_COMMUNITY): Payer: Medicare Other

## 2014-02-24 ENCOUNTER — Encounter (HOSPITAL_COMMUNITY): Payer: Self-pay | Admitting: *Deleted

## 2014-02-24 ENCOUNTER — Inpatient Hospital Stay (HOSPITAL_COMMUNITY)
Admission: EM | Admit: 2014-02-24 | Discharge: 2014-02-28 | DRG: 193 | Disposition: A | Discharge status: Home Health | Payer: Medicare Other | Attending: Family Medicine | Admitting: Family Medicine

## 2014-02-24 DIAGNOSIS — J441 Chronic obstructive pulmonary disease with (acute) exacerbation: Secondary | ICD-10-CM | POA: Diagnosis present

## 2014-02-24 DIAGNOSIS — I10 Essential (primary) hypertension: Secondary | ICD-10-CM | POA: Diagnosis not present

## 2014-02-24 DIAGNOSIS — E785 Hyperlipidemia, unspecified: Secondary | ICD-10-CM | POA: Diagnosis not present

## 2014-02-24 DIAGNOSIS — D494 Neoplasm of unspecified behavior of bladder: Secondary | ICD-10-CM | POA: Diagnosis not present

## 2014-02-24 DIAGNOSIS — Z7982 Long term (current) use of aspirin: Secondary | ICD-10-CM | POA: Diagnosis not present

## 2014-02-24 DIAGNOSIS — J189 Pneumonia, unspecified organism: Secondary | ICD-10-CM | POA: Diagnosis present

## 2014-02-24 DIAGNOSIS — G4700 Insomnia, unspecified: Secondary | ICD-10-CM | POA: Diagnosis not present

## 2014-02-24 DIAGNOSIS — J449 Chronic obstructive pulmonary disease, unspecified: Secondary | ICD-10-CM | POA: Diagnosis present

## 2014-02-24 DIAGNOSIS — J9622 Acute and chronic respiratory failure with hypercapnia: Secondary | ICD-10-CM

## 2014-02-24 DIAGNOSIS — R0602 Shortness of breath: Secondary | ICD-10-CM

## 2014-02-24 DIAGNOSIS — E872 Acidosis: Secondary | ICD-10-CM | POA: Diagnosis not present

## 2014-02-24 DIAGNOSIS — F17200 Nicotine dependence, unspecified, uncomplicated: Secondary | ICD-10-CM | POA: Diagnosis present

## 2014-02-24 DIAGNOSIS — K219 Gastro-esophageal reflux disease without esophagitis: Secondary | ICD-10-CM | POA: Diagnosis not present

## 2014-02-24 DIAGNOSIS — F419 Anxiety disorder, unspecified: Secondary | ICD-10-CM | POA: Diagnosis present

## 2014-02-24 DIAGNOSIS — E780 Pure hypercholesterolemia: Secondary | ICD-10-CM | POA: Diagnosis present

## 2014-02-24 DIAGNOSIS — J45909 Unspecified asthma, uncomplicated: Secondary | ICD-10-CM | POA: Diagnosis not present

## 2014-02-24 DIAGNOSIS — Z720 Tobacco use: Secondary | ICD-10-CM

## 2014-02-24 DIAGNOSIS — J9602 Acute respiratory failure with hypercapnia: Secondary | ICD-10-CM

## 2014-02-24 DIAGNOSIS — G8929 Other chronic pain: Secondary | ICD-10-CM | POA: Diagnosis not present

## 2014-02-24 DIAGNOSIS — I951 Orthostatic hypotension: Secondary | ICD-10-CM | POA: Diagnosis present

## 2014-02-24 DIAGNOSIS — N400 Enlarged prostate without lower urinary tract symptoms: Secondary | ICD-10-CM | POA: Diagnosis not present

## 2014-02-24 DIAGNOSIS — M549 Dorsalgia, unspecified: Secondary | ICD-10-CM | POA: Diagnosis not present

## 2014-02-24 DIAGNOSIS — J9610 Chronic respiratory failure, unspecified whether with hypoxia or hypercapnia: Secondary | ICD-10-CM | POA: Diagnosis present

## 2014-02-24 DIAGNOSIS — J9621 Acute and chronic respiratory failure with hypoxia: Secondary | ICD-10-CM

## 2014-02-24 LAB — CBC WITH DIFFERENTIAL/PLATELET
BASOS PCT: 0 % (ref 0–1)
Basophils Absolute: 0 10*3/uL (ref 0.0–0.1)
EOS ABS: 0.3 10*3/uL (ref 0.0–0.7)
Eosinophils Relative: 2 % (ref 0–5)
HEMATOCRIT: 41 % (ref 39.0–52.0)
Hemoglobin: 13.3 g/dL (ref 13.0–17.0)
Lymphocytes Relative: 20 % (ref 12–46)
Lymphs Abs: 3.2 10*3/uL (ref 0.7–4.0)
MCH: 31.1 pg (ref 26.0–34.0)
MCHC: 32.4 g/dL (ref 30.0–36.0)
MCV: 96 fL (ref 78.0–100.0)
MONO ABS: 1.5 10*3/uL — AB (ref 0.1–1.0)
Monocytes Relative: 9 % (ref 3–12)
NEUTROS PCT: 69 % (ref 43–77)
Neutro Abs: 11.3 10*3/uL — ABNORMAL HIGH (ref 1.7–7.7)
Platelets: 193 10*3/uL (ref 150–400)
RBC: 4.27 MIL/uL (ref 4.22–5.81)
RDW: 14.1 % (ref 11.5–15.5)
WBC: 16.4 10*3/uL — ABNORMAL HIGH (ref 4.0–10.5)

## 2014-02-24 LAB — URINALYSIS, ROUTINE W REFLEX MICROSCOPIC
Bilirubin Urine: NEGATIVE
Glucose, UA: NEGATIVE mg/dL
KETONES UR: NEGATIVE mg/dL
Leukocytes, UA: NEGATIVE
NITRITE: NEGATIVE
Protein, ur: NEGATIVE mg/dL
Specific Gravity, Urine: 1.02 (ref 1.005–1.030)
Urobilinogen, UA: 0.2 mg/dL (ref 0.0–1.0)
pH: 6.5 (ref 5.0–8.0)

## 2014-02-24 LAB — INFLUENZA PANEL BY PCR (TYPE A & B)
H1N1FLUPCR: NOT DETECTED
INFLAPCR: NEGATIVE
INFLBPCR: NEGATIVE

## 2014-02-24 LAB — URINE MICROSCOPIC-ADD ON

## 2014-02-24 LAB — BLOOD GAS, ARTERIAL
ACID-BASE EXCESS: 9.3 mmol/L — AB (ref 0.0–2.0)
BICARBONATE: 35.1 meq/L — AB (ref 20.0–24.0)
Drawn by: 23534
O2 Content: 2.5 L/min
O2 SAT: 90.5 %
PO2 ART: 59.7 mmHg — AB (ref 80.0–100.0)
Patient temperature: 37
TCO2: 32 mmol/L (ref 0–100)
pCO2 arterial: 67.1 mmHg (ref 35.0–45.0)
pH, Arterial: 7.339 — ABNORMAL LOW (ref 7.350–7.450)

## 2014-02-24 LAB — BASIC METABOLIC PANEL
Anion gap: 7 (ref 5–15)
BUN: 8 mg/dL (ref 6–23)
CO2: 38 mEq/L — ABNORMAL HIGH (ref 19–32)
CREATININE: 0.94 mg/dL (ref 0.50–1.35)
Calcium: 9.7 mg/dL (ref 8.4–10.5)
Chloride: 97 mEq/L (ref 96–112)
GFR calc non Af Amer: 82 mL/min — ABNORMAL LOW (ref >90)
Glucose, Bld: 135 mg/dL — ABNORMAL HIGH (ref 70–99)
Potassium: 4 mEq/L (ref 3.7–5.3)
Sodium: 142 mEq/L (ref 137–147)

## 2014-02-24 LAB — TROPONIN I: Troponin I: <0.3 ng/mL (ref <0.30)

## 2014-02-24 MED ORDER — POLYETHYLENE GLYCOL 3350 17 G PO PACK: 17.0000 g | PACK | Freq: Every day | ORAL | Status: DC | PRN

## 2014-02-24 MED ORDER — ALPRAZOLAM 0.5 MG PO TABS: 0.5000 mg | ORAL_TABLET | Freq: Two times a day (BID) | ORAL | Status: DC | PRN

## 2014-02-24 MED ORDER — ALBUTEROL SULFATE (2.5 MG/3ML) 0.083% IN NEBU
2.5000 mg | INHALATION_SOLUTION | Freq: Once | RESPIRATORY_TRACT | Status: AC
  Administered 2014-02-24: 2.5 mg via RESPIRATORY_TRACT
  Filled 2014-02-24: qty 3

## 2014-02-24 MED ORDER — NITROGLYCERIN 0.4 MG SL SUBL: 0.4000 mg | SUBLINGUAL_TABLET | SUBLINGUAL | Status: DC | PRN

## 2014-02-24 MED ORDER — IPRATROPIUM-ALBUTEROL 0.5-2.5 (3) MG/3ML IN SOLN
3.0000 mL | Freq: Once | RESPIRATORY_TRACT | Status: AC
  Administered 2014-02-24: 3 mL via RESPIRATORY_TRACT
  Filled 2014-02-24: qty 3

## 2014-02-24 MED ORDER — CEFTRIAXONE SODIUM 1 G IJ SOLR
INTRAMUSCULAR | Status: AC
  Filled 2014-02-24: qty 10

## 2014-02-24 MED ORDER — CETYLPYRIDINIUM CHLORIDE 0.05 % MT LIQD
7.0000 mL | Freq: Two times a day (BID) | OROMUCOSAL | Status: DC
  Administered 2014-02-24 – 2014-02-28 (×8): 7 mL via OROMUCOSAL

## 2014-02-24 MED ORDER — METHYLPREDNISOLONE SODIUM SUCC 125 MG IJ SOLR
125.0000 mg | Freq: Once | INTRAMUSCULAR | Status: AC
  Administered 2014-02-24: 125 mg via INTRAVENOUS
  Filled 2014-02-24: qty 2

## 2014-02-24 MED ORDER — ROFLUMILAST 500 MCG PO TABS
ORAL_TABLET | ORAL | Status: AC
  Filled 2014-02-24: qty 1

## 2014-02-24 MED ORDER — TAMSULOSIN HCL 0.4 MG PO CAPS
0.4000 mg | ORAL_CAPSULE | Freq: Every morning | ORAL | Status: DC
  Administered 2014-02-25 – 2014-02-28 (×4): 0.4 mg via ORAL
  Filled 2014-02-24 (×4): qty 1

## 2014-02-24 MED ORDER — DEXTROSE 5 % IV SOLN
1.0000 g | INTRAVENOUS | Status: DC
  Administered 2014-02-24 – 2014-02-27 (×4): 1 g via INTRAVENOUS
  Filled 2014-02-24 (×5): qty 10

## 2014-02-24 MED ORDER — PANTOPRAZOLE SODIUM 40 MG PO TBEC
40.0000 mg | DELAYED_RELEASE_TABLET | Freq: Every day | ORAL | Status: DC
  Administered 2014-02-24 – 2014-02-28 (×5): 40 mg via ORAL
  Filled 2014-02-24 (×5): qty 1

## 2014-02-24 MED ORDER — METHYLPREDNISOLONE SODIUM SUCC 125 MG IJ SOLR
125.0000 mg | Freq: Four times a day (QID) | INTRAMUSCULAR | Status: DC
  Administered 2014-02-24 – 2014-02-25 (×3): 125 mg via INTRAVENOUS
  Filled 2014-02-24 (×3): qty 2

## 2014-02-24 MED ORDER — IPRATROPIUM-ALBUTEROL 18-103 MCG/ACT IN AERO: 2.0000 | INHALATION_SPRAY | Freq: Four times a day (QID) | RESPIRATORY_TRACT | Status: DC

## 2014-02-24 MED ORDER — ROFLUMILAST 500 MCG PO TABS
500.0000 ug | ORAL_TABLET | Freq: Every day | ORAL | Status: DC
  Administered 2014-02-24 – 2014-02-28 (×5): 500 ug via ORAL
  Filled 2014-02-24 (×7): qty 1

## 2014-02-24 MED ORDER — INFLUENZA VAC SPLIT QUAD 0.5 ML IM SUSY
0.5000 mL | PREFILLED_SYRINGE | INTRAMUSCULAR | Status: AC
  Administered 2014-02-25: 0.5 mL via INTRAMUSCULAR
  Filled 2014-02-24: qty 0.5

## 2014-02-24 MED ORDER — ONDANSETRON HCL 4 MG/2ML IJ SOLN: 4.0000 mg | Freq: Four times a day (QID) | INTRAMUSCULAR | Status: DC | PRN

## 2014-02-24 MED ORDER — BUDESONIDE-FORMOTEROL FUMARATE 160-4.5 MCG/ACT IN AERO
INHALATION_SPRAY | RESPIRATORY_TRACT | Status: AC
  Filled 2014-02-24: qty 6

## 2014-02-24 MED ORDER — SODIUM CHLORIDE 0.9 % IV SOLN
INTRAVENOUS | Status: AC
  Administered 2014-02-24: 17:00:00 via INTRAVENOUS

## 2014-02-24 MED ORDER — ADULT MULTIVITAMIN W/MINERALS CH
1.0000 | ORAL_TABLET | Freq: Every day | ORAL | Status: DC
Start: 2014-02-24 — End: 2014-02-28
  Administered 2014-02-24 – 2014-02-28 (×5): 1 via ORAL
  Filled 2014-02-24 (×5): qty 1

## 2014-02-24 MED ORDER — VITAMIN D 1000 UNITS PO TABS
1000.0000 [IU] | ORAL_TABLET | Freq: Every morning | ORAL | Status: DC
  Administered 2014-02-25 – 2014-02-28 (×4): 1000 [IU] via ORAL
  Filled 2014-02-24 (×4): qty 1

## 2014-02-24 MED ORDER — VANCOMYCIN HCL 10 G IV SOLR
1500.0000 mg | Freq: Once | INTRAVENOUS | Status: DC
  Filled 2014-02-24: qty 1500

## 2014-02-24 MED ORDER — ASPIRIN EC 81 MG PO TBEC
81.0000 mg | DELAYED_RELEASE_TABLET | Freq: Every morning | ORAL | Status: DC
  Administered 2014-02-25 – 2014-02-28 (×4): 81 mg via ORAL
  Filled 2014-02-24 (×3): qty 1

## 2014-02-24 MED ORDER — SIMVASTATIN 20 MG PO TABS
40.0000 mg | ORAL_TABLET | Freq: Every morning | ORAL | Status: DC
  Administered 2014-02-25 – 2014-02-28 (×4): 40 mg via ORAL
  Filled 2014-02-24 (×4): qty 2

## 2014-02-24 MED ORDER — BUDESONIDE-FORMOTEROL FUMARATE 160-4.5 MCG/ACT IN AERO
2.0000 | INHALATION_SPRAY | Freq: Two times a day (BID) | RESPIRATORY_TRACT | Status: DC
  Administered 2014-02-24 – 2014-02-25 (×2): 2 via RESPIRATORY_TRACT
  Filled 2014-02-24: qty 6

## 2014-02-24 MED ORDER — ALBUTEROL SULFATE (2.5 MG/3ML) 0.083% IN NEBU: 2.5000 mg | INHALATION_SOLUTION | RESPIRATORY_TRACT | Status: DC

## 2014-02-24 MED ORDER — DEXTROSE 5 % IV SOLN
1.0000 g | Freq: Once | INTRAVENOUS | Status: AC
  Administered 2014-02-24: 1 g via INTRAVENOUS
  Filled 2014-02-24: qty 1

## 2014-02-24 MED ORDER — IPRATROPIUM-ALBUTEROL 0.5-2.5 (3) MG/3ML IN SOLN
3.0000 mL | Freq: Four times a day (QID) | RESPIRATORY_TRACT | Status: DC
Start: 2014-02-24 — End: 2014-02-28
  Administered 2014-02-24 – 2014-02-28 (×14): 3 mL via RESPIRATORY_TRACT
  Filled 2014-02-24 (×14): qty 3

## 2014-02-24 MED ORDER — DOXYCYCLINE HYCLATE 100 MG PO TABS
100.0000 mg | ORAL_TABLET | Freq: Two times a day (BID) | ORAL | Status: DC
  Administered 2014-02-24 – 2014-02-25 (×2): 100 mg via ORAL
  Filled 2014-02-24 (×2): qty 1

## 2014-02-24 MED ORDER — SODIUM CHLORIDE 0.9 % IV SOLN
INTRAVENOUS | Status: DC
  Administered 2014-02-24: 15:00:00 via INTRAVENOUS

## 2014-02-24 MED ORDER — VITAMIN B-12 1000 MCG PO TABS
2000.0000 ug | ORAL_TABLET | Freq: Every day | ORAL | Status: DC
  Administered 2014-02-24 – 2014-02-28 (×5): 2000 ug via ORAL
  Filled 2014-02-24 (×7): qty 2

## 2014-02-24 MED ORDER — ONDANSETRON HCL 4 MG PO TABS
4.0000 mg | ORAL_TABLET | Freq: Four times a day (QID) | ORAL | Status: DC | PRN
Start: 2014-02-24 — End: 2014-02-28

## 2014-02-24 MED ORDER — HYDROCODONE-ACETAMINOPHEN 10-325 MG PO TABS
1.0000 | ORAL_TABLET | Freq: Four times a day (QID) | ORAL | Status: DC | PRN
Start: 2014-02-24 — End: 2014-02-28
  Administered 2014-02-25 – 2014-02-28 (×7): 1 via ORAL
  Filled 2014-02-24 (×7): qty 1

## 2014-02-24 MED ORDER — ALBUTEROL SULFATE (2.5 MG/3ML) 0.083% IN NEBU: 2.5000 mg | INHALATION_SOLUTION | RESPIRATORY_TRACT | Status: DC | PRN

## 2014-02-24 NOTE — ED Notes (Signed)
Pt comes from home with SOB starting this morning.  Pt given 2 albuterol treatments in route with relief but wheezing still present. Per EMS patient was discharged from hospital on Thursday, pt is unable to state why he was here.

## 2014-02-24 NOTE — ED Provider Notes (Signed)
CSN: [id number]     Arrival date & time 02/24/14  1225 History   First MD Initiated Contact with Patient 02/24/14 1309     Chief Complaint  Patient presents with  . Shortness of Breath     HPI Pt was seen at 1310.  Per pt and his family, c/o gradual onset and worsening of persistent cough, wheezing and SOB for the past 3 days.  Describes his symptoms as "it might be my COPD."  Has been using home O2 N/C, MDI and nebs with transient relief. Pt's family states pt has been generally weak, to the point where he "can't walk he's so weak" today, as well as been intermittently "confused." Pt states he has stopped smoking since his last hospital admission 2 weeks ago. EMS gave neb treatment x2 en route to the ED. Denies falls, no syncope, no CP/palpitations, no back pain, no abd pain, no N/V/D, no fevers, no rash.     Past Medical History  Diagnosis Date  . COPD (chronic obstructive pulmonary disease)   . Asthma   . Tobacco abuse 09/29/2011  . Hypercholesteremia   . GERD (gastroesophageal reflux disease)   . Headache(784.0)   . BPH (benign prostatic hyperplasia) 10/01/2011    Per cystoscopy  . 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  . Pulmonary nodule 09/2011    Stable appearance  . Chronic back pain   . Chronic anxiety   . Hyperlipidemia 03/13/2012  . Anxiety 03/13/2012  . On home O2     2L N/C continuous  . Emphysema lung    Past Surgical History  Procedure Laterality Date  . Cystoscopy  09/30/2011    Procedure: CYSTOSCOPY FLEXIBLE;  Surgeon: Marissa Nestle, MD;  Location: [facility];  Service: Urology;  Laterality: N/A;  . Transurethral resection of bladder tumor  10/01/2011    Procedure: TRANSURETHRAL RESECTION OF BLADDER TUMOR (TURBT);  Surgeon: Marissa Nestle, MD;  Location: [facility];  Service: Urology;  Laterality: N/A;  . Cardiac catheterization  2003    [facility]  . Fracture surgery      R ring finger, pin placed   Family History  Problem Relation Age of  Onset  . Emphysema Brother     was not close to family; does not truly know their medical problems.  . Emphysema Brother   . Emphysema Brother   . Cancer Neg Hx    History  Substance Use Topics  . Smoking status: Former Smoker -- 1.00 packs/day for 58 years    Types: Cigarettes    Quit date: 02/17/2014  . Smokeless tobacco: Not on file  . Alcohol Use: No    Review of Systems ROS: Statement: All systems negative except as marked or noted in the HPI; Constitutional: Negative for fever and chills. +generalized weakness. ; ; Eyes: Negative for eye pain, redness and discharge. ; ; ENMT: Negative for ear pain, hoarseness, nasal congestion, sinus pressure and sore throat. ; ; Cardiovascular: Negative for chest pain, palpitations, diaphoresis, and peripheral edema. ; ; Respiratory: +cough, wheezing, SOB. Negative for stridor. ; ; Gastrointestinal: Negative for nausea, vomiting, diarrhea, abdominal pain, blood in stool, hematemesis, jaundice and rectal bleeding. . ; ; Genitourinary: Negative for dysuria, flank pain and hematuria. ; ; Musculoskeletal: Negative for back pain and neck pain. Negative for swelling and trauma.; ; Skin: Negative for pruritus, rash, abrasions, blisters, bruising and skin lesion.; ; Neuro: +confusion. Negative for headache, lightheadedness and neck stiffness. Negative for altered level  of consciousness , altered mental status, extremity weakness, paresthesias, involuntary movement, seizure and syncope.      Allergies  Iohexol and Ivp dye  Home Medications   Prior to Admission medications   Medication Sig Start Date End Date Taking? Authorizing Provider  albuterol (PROVENTIL) (2.5 MG/3ML) 0.083% nebulizer solution Take 2.5 mg by nebulization every 4 (four) hours as needed. For shortness of breath   Yes Historical Provider, MD  albuterol-ipratropium (COMBIVENT) 18-103 MCG/ACT inhaler Inhale 2 puffs into the lungs 4 (four) times daily. 03/30/13  Yes Rexene Alberts, MD   ALPRAZolam Duanne Moron) 1 MG tablet Take 0.5-1 mg by mouth 2 (two) times daily as needed for anxiety. anxiety 02/20/13  Yes Domenic Polite, MD  aspirin EC 81 MG tablet Take 81 mg by mouth every morning.  10/02/11  Yes Rexene Alberts, MD  budesonide-formoterol [facility]) 160-4.5 MCG/ACT inhaler Inhale 2 puffs into the lungs 2 (two) times daily. 03/30/13  Yes Rexene Alberts, MD  Cholecalciferol (VITAMIN D3) 1000 UNITS CAPS Take 1 capsule by mouth every morning.    Yes Historical Provider, MD  Cyanocobalamin (B-12) 2500 MCG TABS Take 1 tablet by mouth daily.   Yes Historical Provider, MD  DALIRESP 500 MCG TABS tablet Take 500 mcg by mouth daily. 01/26/14  Yes Historical Provider, MD  doxycycline (VIBRA-TABS) 100 MG tablet Take 1 tablet (100 mg total) by mouth every 12 (twelve) hours. 02/13/14  Yes Nita Sells, MD  esomeprazole (NEXIUM) 40 MG capsule Take 40 mg by mouth every morning.   Yes Historical Provider, MD  Garlic 1540 MG CAPS Take 1 capsule by mouth every morning.    Yes Historical Provider, MD  HYDROcodone-acetaminophen (NORCO) 10-325 MG per tablet Take 1 tablet by mouth every 6 (six) hours as needed. For pain 06/14/13  Yes Historical Provider, MD  levofloxacin (LEVAQUIN) 750 MG tablet Take 1 tablet by mouth daily. Starting 02/18/2014 x 10 days. 02/18/14  Yes Historical Provider, MD  Multiple Vitamin (MULTIVITAMIN WITH MINERALS) TABS tablet Take 1 tablet by mouth daily.   Yes Historical Provider, MD  NITROSTAT 0.4 MG SL tablet Place 1 tablet under the tongue as needed for chest pain.  02/22/13  Yes Historical Provider, MD  polyethylene glycol powder (GLYCOLAX/MIRALAX) powder Take 17 g by mouth daily as needed for mild constipation or moderate constipation.    Yes Historical Provider, MD  predniSONE (DELTASONE) 20 MG tablet Take 3 tablets (60 mg total) by mouth daily before breakfast. 02/13/14  Yes Nita Sells, MD  simvastatin (ZOCOR) 40 MG tablet Take 40 mg by mouth every morning.    Yes  Historical Provider, MD  tamsulosin (FLOMAX) 0.4 MG CAPS capsule Take 0.4 mg by mouth every morning.  07/06/13  Yes Historical Provider, MD   BP 110/66 mmHg  Pulse 117  Temp(Src) 98.4 F (36.9 C) (Oral)  Resp 28  Ht 5\' 5"  (1.651 m)  Wt 158 lb (71.668 kg)  BMI 26.29 kg/m2  SpO2 94%   14:20 Orthostatic Vital Signs VP  Orthostatic Lying  - BP- Lying: 100/63 mmHg ; Pulse- Lying: 98  Orthostatic Sitting - BP- Sitting: 100/67 mmHg ; Pulse- Sitting: 98  Orthostatic Standing at 0 minutes - BP- Standing at 0 minutes: 105/66 mmHg ; Pulse- Standing at 0 minutes: 108      Physical Exam 1315: Physical examination:  Nursing notes reviewed; Vital signs and O2 SAT reviewed;  Constitutional: Well developed, Well nourished, In no acute distress; Head:  Normocephalic, atraumatic; Eyes: EOMI, PERRL, No scleral icterus; ENMT:  Mouth and pharynx normal, Mucous membranes dry; Neck: Supple, Full range of motion, No lymphadenopathy; Cardiovascular: Tachycardic rate and rhythm, No gallop; Respiratory: Breath sounds coarse & equal bilaterally, scattered wheezes. No audible wheezing. Speaking full sentences with ease, Normal respiratory effort/excursion; Chest: Nontender, Movement normal; Abdomen: Soft, Nontender, Nondistended, Normal bowel sounds; Genitourinary: No CVA tenderness; Extremities: Pulses normal, No tenderness, No edema, No calf edema or asymmetry.; Neuro: AA&Ox3, mildly confused re: events. Major CN grossly intact.  Speech clear. No gross focal motor or sensory deficits in extremities.; Skin: Color normal, Warm, Dry.   ED Course  Procedures     EKG Interpretation   Date/Time:  Thursday February 24 2014 12:34:10 EST Ventricular Rate:  111 PR Interval:  169 QRS Duration: 75 QT Interval:  363 QTC Calculation: 493 R Axis:   97 Text Interpretation:  Sinus tachycardia Consider left atrial enlargement  Right axis deviation Borderline prolonged QT interval Rate faster  Confirmed by Wyvonnia Dusky  MD,  STEPHEN [phone]) on 02/24/2014 12:38:20 PM      MDM  MDM Reviewed: nursing note, previous chart and vitals Reviewed previous: labs and ECG Interpretation: labs, ECG and x-ray     Results for orders placed or performed during the hospital encounter of 02/24/14  CBC with Differential  Result Value Ref Range   WBC 16.4 (H) 4.0 - 10.5 K/uL   RBC 4.27 4.22 - 5.81 MIL/uL   Hemoglobin 13.3 13.0 - 17.0 g/dL   HCT 41.0 39.0 - 52.0 %   MCV 96.0 78.0 - 100.0 fL   MCH 31.1 26.0 - 34.0 pg   MCHC 32.4 30.0 - 36.0 g/dL   RDW 14.1 11.5 - 15.5 %   Platelets 193 150 - 400 K/uL   Neutrophils Relative % 69 43 - 77 %   Neutro Abs 11.3 (H) 1.7 - 7.7 K/uL   Lymphocytes Relative 20 12 - 46 %   Lymphs Abs 3.2 0.7 - 4.0 K/uL   Monocytes Relative 9 3 - 12 %   Monocytes Absolute 1.5 (H) 0.1 - 1.0 K/uL   Eosinophils Relative 2 0 - 5 %   Eosinophils Absolute 0.3 0.0 - 0.7 K/uL   Basophils Relative 0 0 - 1 %   Basophils Absolute 0.0 0.0 - 0.1 K/uL  Basic metabolic panel  Result Value Ref Range   Sodium 142 137 - 147 mEq/L   Potassium 4.0 3.7 - 5.3 mEq/L   Chloride 97 96 - 112 mEq/L   CO2 38 (H) 19 - 32 mEq/L   Glucose, Bld 135 (H) 70 - 99 mg/dL   BUN 8 6 - 23 mg/dL   Creatinine, Ser 0.94 0.50 - 1.35 mg/dL   Calcium 9.7 8.4 - 10.5 mg/dL   GFR calc non Af Amer 82 (L) >90 mL/min   GFR calc Af Amer >90 >90 mL/min   Anion gap 7 5 - 15  Troponin I  Result Value Ref Range   Troponin I <0.30 <0.30 ng/mL  Blood gas, arterial  Result Value Ref Range   O2 Content 2.5 L/min   Delivery systems NASAL CANNULA    pH, Arterial 7.339 (L) 7.350 - 7.450   pCO2 arterial 67.1 (HH) 35.0 - 45.0 mmHg   pO2, Arterial 59.7 (L) 80.0 - 100.0 mmHg   Bicarbonate 35.1 (H) 20.0 - 24.0 mEq/L   TCO2 32.0 0 - 100 mmol/L   Acid-Base Excess 9.3 (H) 0.0 - 2.0 mmol/L   O2 Saturation 90.5 %   Patient temperature 37.0  Collection site LEFT RADIAL    Drawn by [phone]    Sample type ARTERIAL    Allens test (pass/fail) PASS  PASS  Urinalysis, Routine w reflex microscopic  Result Value Ref Range   Color, Urine YELLOW YELLOW   APPearance CLEAR CLEAR   Specific Gravity, Urine 1.020 1.005 - 1.030   pH 6.5 5.0 - 8.0   Glucose, UA NEGATIVE NEGATIVE mg/dL   Hgb urine dipstick SMALL (A) NEGATIVE   Bilirubin Urine NEGATIVE NEGATIVE   Ketones, ur NEGATIVE NEGATIVE mg/dL   Protein, ur NEGATIVE NEGATIVE mg/dL   Urobilinogen, UA 0.2 0.0 - 1.0 mg/dL   Nitrite NEGATIVE NEGATIVE   Leukocytes, UA NEGATIVE NEGATIVE  Urine microscopic-add on  Result Value Ref Range   Squamous Epithelial / LPF RARE RARE   WBC, UA 11-20 <3 WBC/hpf   RBC / HPF 11-20 <3 RBC/hpf   Bacteria, UA RARE RARE   Dg Chest Portable 1 View 02/24/2014   CLINICAL DATA:  Shortness of breath beginning this morning. Wheezing. History of COPD and asthma. Smoking history.  EXAM: PORTABLE CHEST - 1 VIEW  COMPARISON:  02/09/2014  FINDINGS: The cardiomediastinal silhouette is within normal limits. Lung volumes are unchanged. Diffuse interstitial densities throughout both lungs have mildly increased from the prior study with mild confluent opacity in the right lung base. No pleural effusion or pneumothorax is identified. No acute osseous abnormality is identified.  IMPRESSION: Diffuse interstitial opacities, mildly increased from the prior study. This may reflect an acute exacerbation of underlying chronic interstitial lung disease or superimposed infectious pneumonitis.   Electronically Signed   By: Logan Bores   On: 02/24/2014 13:19    1520:  Pt given IV solumedrol and 2 neb treatments while in the ED. Lungs continue coarse bilat, Sats 93-94% on O2 2L N/C. Pt orthostatic on VS, unable to stand due to feeling "weak." ED staff attempted to walk pt with heavy assist x2 with Sats dropping to 86% despite pt wearing his usual home O2 N/C. ABG near pt's baseline. Will dose another neb treatment, start IV abx, admit. Dx and testing d/w pt and family.  Questions answered.  Verb  understanding, agreeable to admit. T/C to [facility]. Anastasio Champion, case discussed, including:  HPI, pertinent PM/SHx, VS/PE, dx testing, ED course and treatment:  Agreeable to admit, requests to write temporary orders, obtain medical bed.    Francine Graven, DO 02/26/14 [phone]

## 2014-02-24 NOTE — ED Notes (Signed)
Patient is unable to walk.  Patient was barely able to stand for ortho v/s.  Upon standing beside bed o2 sat on 3 lpm dropped to 86%

## 2014-02-24 NOTE — ED Notes (Signed)
RT made aware of pt needs.

## 2014-02-24 NOTE — ED Notes (Signed)
Respiratory called with critical lab value:  CO2: 67.1  Dr. Thurnell Garbe made aware.

## 2014-02-24 NOTE — ED Notes (Signed)
Pt needed two person assist to stand at bedside. While attempting to ambulate, pts 02 stat dropped to 86%.

## 2014-02-24 NOTE — Progress Notes (Signed)
ANTIBIOTIC CONSULT NOTE - INITIAL  Pharmacy Consult for Vancomycin Indication: pneumonia  Allergies  Allergen Reactions  . Iohexol Hives     Code: HIVES, Desc: PT STATES HE BROKE OUT IN HIVES AND RASH AFTER CT NECK EARLY SEPT 2011; NO RESP PROBLEMS; NEEDS PRE-MEDS; MKS, Onset Date: [id number]   . Ivp Dye [Iodinated Diagnostic Agents]    Patient Measurements: Height: 5\' 5"  (165.1 cm) Weight: 158 lb (71.668 kg) IBW/kg (Calculated) : 61.5  Vital Signs: Temp: 98.4 F (36.9 C) (11/12 1230) Temp Source: Oral (11/12 1230) BP: 112/70 mmHg (11/12 1400) Pulse Rate: 98 (11/12 1400) Intake/Output from previous day:   Intake/Output from this shift:    Labs:  Recent Labs  02/24/14 1253  WBC 16.4*  HGB 13.3  PLT 193  CREATININE 0.94   Estimated Creatinine Clearance: 61.8 mL/min (by C-G formula based on Cr of 0.94). No results for input(s): VANCOTROUGH, VANCOPEAK, VANCORANDOM, GENTTROUGH, GENTPEAK, GENTRANDOM, TOBRATROUGH, TOBRAPEAK, TOBRARND, AMIKACINPEAK, AMIKACINTROU, AMIKACIN in the last 72 hours.   Microbiology: Recent Results (from the past 720 hour(s))  MRSA PCR Screening     Status: None   Collection Time: 02/10/14 11:00 AM  Result Value Ref Range Status   MRSA by PCR NEGATIVE NEGATIVE Final    Comment:        The GeneXpert MRSA Assay (FDA approved for NASAL specimens only), is one component of a comprehensive MRSA colonization surveillance program. It is not intended to diagnose MRSA infection nor to guide or monitor treatment for MRSA infections.   Medical History: Past Medical History  Diagnosis Date  . COPD (chronic obstructive pulmonary disease)   . Asthma   . Tobacco abuse 09/29/2011  . Hypercholesteremia   . GERD (gastroesophageal reflux disease)   . Headache(784.0)   . BPH (benign prostatic hyperplasia) 10/01/2011    Per cystoscopy  . 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  . Pulmonary nodule 09/2011    Stable appearance  .  Chronic back pain   . Chronic anxiety   . Hyperlipidemia 03/13/2012  . Anxiety 03/13/2012  . On home O2     2L N/C continuous  . Emphysema lung    Anti-infectives    Start     Dose/Rate Route Frequency Ordered Stop   02/24/14 1600  vancomycin (VANCOCIN) 1,500 mg in sodium chloride 0.9 % 500 mL IVPB     1,500 mg250 mL/hr over 120 Minutes Intravenous  Once 02/24/14 1525     02/24/14 1530  ceFEPIme (MAXIPIME) 1 g in dextrose 5 % 50 mL IVPB     1 g100 mL/hr over 30 Minutes Intravenous  Once 02/24/14 1524       Assessment: 73yo male admitted via ED with SOB.  Asked to initiate Vancomycin for presumed pneumonia.  SCr at baseline. WBC is elevated.  Goal of Therapy:  Vancomycin trough level 15-20 mcg/ml  Plan:  Vancomycin 1500mg  IV now x 1 dose then Vancomycin 1000mg  IV q12hrs Check trough at steady state Monitor labs, progress, renal fxn, and cultures  Hart Robinsons A 02/24/2014,3:28 PM

## 2014-02-24 NOTE — H&P (Signed)
[facility] History and Physical  CHIBUIKE FLEEK GYJ:[id number] DOB: May 16, 1940 DOA: 02/24/2014  Referring physician: ER PCP: [name], PA-C   Chief Complaint: dyspnea and productive cough.  HPI: [name] [name] is a 73 y.o. male  This is a 73 year old man, well-known to the hospital, who has a history of COPD and continues to smoke cigarettes and presents now with 2-3 day history of dyspnea associated with cough productive of green sputum according to him. He has not been feeling feverish. He apparently has been feeling weak to the point where he is unable to him walk by himself.evaluation in the emergency room showed him to be dyspneic at rest and he was desaturating with soft blood pressure. He is now being admitted for further management.   Review of Systems:  Constitutional:  No weight loss, night sweats, Fevers, chills HEENT:  No headaches, Difficulty swallowing,Tooth/dental problems,Sore throat,  No sneezing, itching, ear ache, nasal congestion, post nasal drip,  Cardio-vascular:  No chest pain, Orthopnea, PND, swelling in lower extremities, anasarca, dizziness, palpitations  GI:  No heartburn, indigestion, abdominal pain, nausea, vomiting, diarrhea, change in bowel habits, loss of appetite    GU:  no dysuria, change in color of urine, no urgency or frequency. No flank pain.  Musculoskeletal:  No joint pain or swelling. No decreased range of motion. No back pain.  Psych:  No change in mood or affect. No depression or anxiety. No memory loss.   Past Medical History  Diagnosis Date  . COPD (chronic obstructive pulmonary disease)   . Asthma   . Tobacco abuse 09/29/2011  . Hypercholesteremia   . GERD (gastroesophageal reflux disease)   . Headache(784.0)   . BPH (benign prostatic hyperplasia) 10/01/2011    Per cystoscopy  . 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  . Pulmonary nodule 09/2011    Stable appearance  . Chronic back pain   .  Chronic anxiety   . Hyperlipidemia 03/13/2012  . Anxiety 03/13/2012  . On home O2     2L N/C continuous  . Emphysema lung    Past Surgical History  Procedure Laterality Date  . Cystoscopy  09/30/2011    Procedure: CYSTOSCOPY FLEXIBLE;  Surgeon: Marissa Nestle, MD;  Location: [facility];  Service: Urology;  Laterality: N/A;  . Transurethral resection of bladder tumor  10/01/2011    Procedure: TRANSURETHRAL RESECTION OF BLADDER TUMOR (TURBT);  Surgeon: Marissa Nestle, MD;  Location: [facility];  Service: Urology;  Laterality: N/A;  . Cardiac catheterization  2003    [facility]  . Fracture surgery      R ring finger, pin placed   Social History:  reports that he quit smoking 7 days ago. His smoking use included Cigarettes. He has a 58 pack-year smoking history. He does not have any smokeless tobacco history on file. He reports that he does not drink alcohol or use illicit drugs.  Allergies  Allergen Reactions  . Iohexol Hives     Code: HIVES, Desc: PT STATES HE BROKE OUT IN HIVES AND RASH AFTER CT NECK EARLY SEPT 2011; NO RESP PROBLEMS; NEEDS PRE-MEDS; MKS, Onset Date: [id number]   . Ivp Dye [Iodinated Diagnostic Agents]     Family History  Problem Relation Age of Onset  . Emphysema Brother     was not close to family; does not truly know their medical problems.  . Emphysema Brother   . Emphysema Brother   . Cancer Neg Hx  Prior to Admission medications   Medication Sig Start Date End Date Taking? Authorizing Provider  albuterol (PROVENTIL) (2.5 MG/3ML) 0.083% nebulizer solution Take 2.5 mg by nebulization every 4 (four) hours as needed. For shortness of breath   Yes Historical Provider, MD  albuterol-ipratropium (COMBIVENT) 18-103 MCG/ACT inhaler Inhale 2 puffs into the lungs 4 (four) times daily. 03/30/13  Yes Rexene Alberts, MD  ALPRAZolam Duanne Moron) 1 MG tablet Take 0.5-1 mg by mouth 2 (two) times daily as needed for anxiety. anxiety 02/20/13  Yes Domenic Polite, MD  aspirin EC 81 MG  tablet Take 81 mg by mouth every morning.  10/02/11  Yes Rexene Alberts, MD  budesonide-formoterol [facility]) 160-4.5 MCG/ACT inhaler Inhale 2 puffs into the lungs 2 (two) times daily. 03/30/13  Yes Rexene Alberts, MD  Cholecalciferol (VITAMIN D3) 1000 UNITS CAPS Take 1 capsule by mouth every morning.    Yes Historical Provider, MD  Cyanocobalamin (B-12) 2500 MCG TABS Take 1 tablet by mouth daily.   Yes Historical Provider, MD  DALIRESP 500 MCG TABS tablet Take 500 mcg by mouth daily. 01/26/14  Yes Historical Provider, MD  doxycycline (VIBRA-TABS) 100 MG tablet Take 1 tablet (100 mg total) by mouth every 12 (twelve) hours. 02/13/14  Yes Nita Sells, MD  esomeprazole (NEXIUM) 40 MG capsule Take 40 mg by mouth every morning.   Yes Historical Provider, MD  Garlic 5732 MG CAPS Take 1 capsule by mouth every morning.    Yes Historical Provider, MD  HYDROcodone-acetaminophen (NORCO) 10-325 MG per tablet Take 1 tablet by mouth every 6 (six) hours as needed. For pain 06/14/13  Yes Historical Provider, MD  levofloxacin (LEVAQUIN) 750 MG tablet Take 1 tablet by mouth daily. Starting 02/18/2014 x 10 days. 02/18/14  Yes Historical Provider, MD  Multiple Vitamin (MULTIVITAMIN WITH MINERALS) TABS tablet Take 1 tablet by mouth daily.   Yes Historical Provider, MD  NITROSTAT 0.4 MG SL tablet Place 1 tablet under the tongue as needed for chest pain.  02/22/13  Yes Historical Provider, MD  polyethylene glycol powder (GLYCOLAX/MIRALAX) powder Take 17 g by mouth daily as needed for mild constipation or moderate constipation.    Yes Historical Provider, MD  predniSONE (DELTASONE) 20 MG tablet Take 3 tablets (60 mg total) by mouth daily before breakfast. 02/13/14  Yes Nita Sells, MD  simvastatin (ZOCOR) 40 MG tablet Take 40 mg by mouth every morning.    Yes Historical Provider, MD  tamsulosin (FLOMAX) 0.4 MG CAPS capsule Take 0.4 mg by mouth every morning.  07/06/13  Yes Historical Provider, MD   Physical  Exam: Filed Vitals:   02/24/14 1400 02/24/14 1430 02/24/14 1546 02/24/14 1610  BP: 112/70 100/67  107/73  Pulse: 98 95  98  Temp:      TempSrc:      Resp:  25    Height:      Weight:      SpO2: 93% 90% 91% 89%    Wt Readings from Last 3 Encounters:  02/24/14 71.668 kg (158 lb)  02/11/14 78.7 kg (173 lb 8 oz)  01/20/14 77.565 kg (171 lb)    General:  Appears calm and comfortable.does not appear toxic. Eyes: PERRL, normal lids, irises & conjunctiva ENT: grossly normal hearing, lips & tongue Neck: no LAD, masses or thyromegaly Cardiovascular: RRR, no m/r/g. No LE edema. Telemetry: SR, no arrhythmias  Respiratory:bilateral wheezing, fairly tight. No bronchial breathing or significant crackles. Abdomen: soft, ntnd Skin: no rash or induration seen on limited exam Musculoskeletal: grossly  normal tone BUE/BLE Psychiatric: grossly normal mood and affect, speech fluent and appropriate Neurologic: grossly non-focal.          Labs on Admission:  Basic Metabolic Panel:  Recent Labs Lab 02/24/14 1253  NA 142  K 4.0  CL 97  CO2 38*  GLUCOSE 135*  BUN 8  CREATININE 0.94  CALCIUM 9.7   Liver Function Tests: No results for input(s): AST, ALT, ALKPHOS, BILITOT, PROT, ALBUMIN in the last 168 hours. No results for input(s): LIPASE, AMYLASE in the last 168 hours. No results for input(s): AMMONIA in the last 168 hours. CBC:  Recent Labs Lab 02/24/14 1253  WBC 16.4*  NEUTROABS 11.3*  HGB 13.3  HCT 41.0  MCV 96.0  PLT 193   Cardiac Enzymes:  Recent Labs Lab 02/24/14 1318  TROPONINI <0.30    BNP (last 3 results)  Recent Labs  07/30/13 1810 02/09/14 1611  PROBNP 101.9 115.9   CBG: No results for input(s): GLUCAP in the last 168 hours.  Radiological Exams on Admission: Dg Chest Portable 1 View  02/24/2014   CLINICAL DATA:  Shortness of breath beginning this morning. Wheezing. History of COPD and asthma. Smoking history.  EXAM: PORTABLE CHEST - 1 VIEW   COMPARISON:  02/09/2014  FINDINGS: The cardiomediastinal silhouette is within normal limits. Lung volumes are unchanged. Diffuse interstitial densities throughout both lungs have mildly increased from the prior study with mild confluent opacity in the right lung base. No pleural effusion or pneumothorax is identified. No acute osseous abnormality is identified.  IMPRESSION: Diffuse interstitial opacities, mildly increased from the prior study. This may reflect an acute exacerbation of underlying chronic interstitial lung disease or superimposed infectious pneumonitis.   Electronically Signed   By: Logan Bores   On: 02/24/2014 13:19    EKG: Independently reviewed. Prolonged QT but in sinus rhythm without any ST T-wave changes.  Assessment/Plan   1. COPD exacerbation. 2. Hypertension, blood pressure somewhat soft. 3. Acute on chronic respiratory failure secondary to #1. 4. Ongoing tobacco abuse.  Plan: 1. Admit medical floor. 2. Intravenous steroids. 3. Intravenous antibiotics although I'm not sure there is a significant infectious component here. 4. Bronchodilators as necessary. 5. IV fluids.  Further recommendations will depend on patient's hospital progress.   Code Status: full code.   DVT Prophylaxis:SCDs. Patient refused heparin or Lovenox.  Family Communication: I discussed the plan with the patient at the bedside.   Disposition Plan: home when medically stable.   Time spent: 60 minutes.  Doree Albee [facility] Pager [phone].

## 2014-02-25 DIAGNOSIS — J189 Pneumonia, unspecified organism: Secondary | ICD-10-CM | POA: Diagnosis not present

## 2014-02-25 DIAGNOSIS — J9602 Acute respiratory failure with hypercapnia: Secondary | ICD-10-CM

## 2014-02-25 DIAGNOSIS — J9621 Acute and chronic respiratory failure with hypoxia: Secondary | ICD-10-CM

## 2014-02-25 DIAGNOSIS — J9611 Chronic respiratory failure with hypoxia: Secondary | ICD-10-CM

## 2014-02-25 DIAGNOSIS — F17200 Nicotine dependence, unspecified, uncomplicated: Secondary | ICD-10-CM

## 2014-02-25 LAB — BLOOD GAS, ARTERIAL
ACID-BASE EXCESS: 7.6 mmol/L — AB (ref 0.0–2.0)
BICARBONATE: 32.3 meq/L — AB (ref 20.0–24.0)
Drawn by: 38235
FIO2: 0.28 %
O2 SAT: 90 %
PATIENT TEMPERATURE: 37
PO2 ART: 55.1 mmHg — AB (ref 80.0–100.0)
TCO2: 29 mmol/L (ref 0–100)
pCO2 arterial: 52.1 mmHg — ABNORMAL HIGH (ref 35.0–45.0)
pH, Arterial: 7.409 (ref 7.350–7.450)

## 2014-02-25 LAB — CBC
HCT: 39.2 % (ref 39.0–52.0)
HEMOGLOBIN: 12.8 g/dL — AB (ref 13.0–17.0)
MCH: 30.7 pg (ref 26.0–34.0)
MCHC: 32.7 g/dL (ref 30.0–36.0)
MCV: 94 fL (ref 78.0–100.0)
Platelets: 204 10*3/uL (ref 150–400)
RBC: 4.17 MIL/uL — ABNORMAL LOW (ref 4.22–5.81)
RDW: 13.9 % (ref 11.5–15.5)
WBC: 13.1 10*3/uL — ABNORMAL HIGH (ref 4.0–10.5)

## 2014-02-25 LAB — COMPREHENSIVE METABOLIC PANEL
ALT: 21 U/L (ref 0–53)
AST: 15 U/L (ref 0–37)
Albumin: 3.2 g/dL — ABNORMAL LOW (ref 3.5–5.2)
Alkaline Phosphatase: 70 U/L (ref 39–117)
Anion gap: 14 (ref 5–15)
BILIRUBIN TOTAL: 0.3 mg/dL (ref 0.3–1.2)
BUN: 12 mg/dL (ref 6–23)
CHLORIDE: 99 meq/L (ref 96–112)
CO2: 29 meq/L (ref 19–32)
Calcium: 9.7 mg/dL (ref 8.4–10.5)
Creatinine, Ser: 0.72 mg/dL (ref 0.50–1.35)
GFR calc non Af Amer: >90 mL/min (ref >90)
Glucose, Bld: 175 mg/dL — ABNORMAL HIGH (ref 70–99)
POTASSIUM: 3.9 meq/L (ref 3.7–5.3)
Sodium: 142 mEq/L (ref 137–147)
TOTAL PROTEIN: 6.6 g/dL (ref 6.0–8.3)

## 2014-02-25 LAB — URINE CULTURE
Colony Count: NO GROWTH
Culture: NO GROWTH

## 2014-02-25 MED ORDER — HEPARIN SODIUM (PORCINE) 5000 UNIT/ML IJ SOLN
5000.0000 [IU] | Freq: Three times a day (TID) | INTRAMUSCULAR | Status: DC
  Administered 2014-02-25 – 2014-02-28 (×8): 5000 [IU] via SUBCUTANEOUS
  Filled 2014-02-25 (×9): qty 1

## 2014-02-25 MED ORDER — ALPRAZOLAM 0.25 MG PO TABS
0.2500 mg | ORAL_TABLET | Freq: Two times a day (BID) | ORAL | Status: DC | PRN
  Administered 2014-02-25 – 2014-02-28 (×4): 0.25 mg via ORAL
  Filled 2014-02-25 (×4): qty 1

## 2014-02-25 MED ORDER — VANCOMYCIN HCL IN DEXTROSE 1-5 GM/200ML-% IV SOLN
1000.0000 mg | Freq: Two times a day (BID) | INTRAVENOUS | Status: DC
  Administered 2014-02-25: 1000 mg via INTRAVENOUS
  Filled 2014-02-25 (×7): qty 200

## 2014-02-25 MED ORDER — METHYLPREDNISOLONE SODIUM SUCC 125 MG IJ SOLR
60.0000 mg | Freq: Four times a day (QID) | INTRAMUSCULAR | Status: DC
  Administered 2014-02-25 – 2014-02-27 (×9): 60 mg via INTRAVENOUS
  Filled 2014-02-25 (×9): qty 2

## 2014-02-25 MED ORDER — AZITHROMYCIN 250 MG PO TABS
500.0000 mg | ORAL_TABLET | Freq: Every day | ORAL | Status: DC
  Administered 2014-02-25 – 2014-02-28 (×4): 500 mg via ORAL
  Filled 2014-02-25 (×4): qty 2

## 2014-02-25 NOTE — Evaluation (Signed)
Physical Therapy Evaluation Patient Details Name: [name] [name] MRN: [id number] DOB: Feb 04, 1941 Today's Date: 02/25/2014   History of Present Illness  This is a 73 year old man, well-known to the hospital, who has a history of COPD and continues to smoke cigarettes and presents now with 2-3 day history of dyspnea associated with cough productive of green sputum according to him. He has not been feeling feverish. He apparently has been feeling weak to the point where he is unable to him walk by himself.evaluation in the emergency room showed him to be dyspneic at rest and he was desaturating with soft blood pressure. He is now being admitted for further management.  Clinical Impression  Pt presents with dependencies in mobility affecting his Independence. Pt is currently experiencing SOB with activity and is not able to tolerate much gait. Pt lives with his girlfriend who is unable to provide him any assistance. Pt is requesting D/C home. Pt is currently min assist with gait 10Ft and O2 sats 88-92%. I educated on pursed lip breathing, but pt needs some reinforcement. I recommend d/c to SNF, but with pt refusal recommend HHPT and use of a RW. Pt is at a fall risk if he attempts to mobilize without assistance. Pt acknowledged my recommendations. Pt would benefit from continued acute PT for activity tolerance and increasing his Independence until d/c home.    Follow Up Recommendations Home health PT    Equipment Recommendations  Rolling walker with 5" wheels    Recommendations for Other Services       Precautions / Restrictions Precautions Precautions: Fall Restrictions Weight Bearing Restrictions: No      Mobility  Bed Mobility Overal bed mobility: Modified Independent             General bed mobility comments: HOB elevated, and use of rails  Transfers   Equipment used: None Transfers: Sit to/from Stand Sit to Stand: Min assist         General transfer comment: balance  support  Ambulation/Gait Ambulation/Gait assistance: Min assist Ambulation Distance (Feet): 10 Feet Assistive device: None Gait Pattern/deviations: Trunk flexed;Shuffle;Step-through pattern Gait velocity: decreased Gait velocity interpretation: Below normal speed for age/gender General Gait Details: frequent standing rest breaks, O2 sats monitored 88-92% with gait, also increased SOB, educated on pursed lip breathing.  Stairs            Wheelchair Mobility    Modified Rankin (Stroke Patients Only)       Balance Overall balance assessment: Needs assistance Sitting-balance support: No upper extremity supported;Feet supported Sitting balance-Leahy Scale: Good     Standing balance support: No upper extremity supported Standing balance-Leahy Scale: Poor                               Pertinent Vitals/Pain Pain Assessment: No/denies pain    Home Living Family/patient expects to be discharged to:: Private residence Living Arrangements: Spouse/significant other Available Help at Discharge: Other (Comment) (signifcant other not able to assist) Type of Home: House Home Access: Ramped entrance     Home Layout: One level Home Equipment: None Additional Comments: Pt reports his girlfriend has an aide who helps her and she is unable to assist him.    Prior Function Level of Independence: Independent               Hand Dominance        Extremity/Trunk Assessment   Upper Extremity Assessment: Defer to OT evaluation  Lower Extremity Assessment: Generalized weakness         Communication   Communication: No difficulties  Cognition Arousal/Alertness: Awake/alert Behavior During Therapy: WFL for tasks assessed/performed Overall Cognitive Status: Within Functional Limits for tasks assessed                      General Comments      Exercises        Assessment/Plan    PT Assessment Patient needs continued PT  services  PT Diagnosis Difficulty walking;Generalized weakness   PT Problem List Decreased strength;Decreased activity tolerance;Decreased balance;Decreased mobility;Decreased knowledge of use of DME;Decreased safety awareness;Cardiopulmonary status limiting activity  PT Treatment Interventions DME instruction;Gait training;Functional mobility training;Therapeutic activities;Therapeutic exercise;Balance training;Patient/family education   PT Goals (Current goals can be found in the Care Plan section) Acute Rehab PT Goals Patient Stated Goal: to go home PT Goal Formulation: With patient Time For Goal Achievement: 03/11/14 Potential to Achieve Goals: Good    Frequency Min 3X/week   Barriers to discharge Decreased caregiver support Pt wants to d/c home and agrees to HHPT.  Pt made aware he is not independent with his mobility but is refusing SNF.    Co-evaluation               End of Session Equipment Utilized During Treatment: Gait belt;Oxygen Activity Tolerance: Patient limited by fatigue (SOB) Patient left: in bed;with call bell/phone within reach Nurse Communication: Mobility status         Time: 1212-1243 PT Time Calculation (min) (ACUTE ONLY): 31 min   Charges:   PT Evaluation $Initial PT Evaluation Tier I: 1 Procedure PT Treatments $Gait Training: 8-22 mins   PT G Codes:          Lelon Mast 02/25/2014, 12:49 PM

## 2014-02-25 NOTE — Plan of Care (Signed)
Problem: Phase I Progression Outcomes Goal: O2 sats > or equal 90% or at baseline Outcome: Not Met (add Reason) Patient O2 sats between 87%-89%. Patient continues on O2 2l/min via nasal cannula.

## 2014-02-25 NOTE — Progress Notes (Signed)
UR chart review completed.  

## 2014-02-25 NOTE — Progress Notes (Signed)
PROGRESS NOTE  [name] [name] CWC:[id number] DOB: 10-Oct-1940 DOA: 02/24/2014 PCP: [name], PA-C  Summary: 73 year old man with history of COPD, chronic hypoxic respiratory failure on oxygen, ongoing cigarette smoking who presented to the emergency room with increasing shortness of breath and cough that was productive.  Assessment/Plan: 1. Acute on chronic hypoxic respiratory failure, acute hypercapnic respiratory failure with respiratory acidosis. Multifactorial, COPD exacerbation, pneumonia. 2. COPD exacerbation, no improvement yet. 3. Community acquired pneumonia. 4. Chronic hypoxic respiratory failure 5. Tobacco dependence. Recommend smoking cessation. 6. Illiterate    Respiratory acidosis has resolved, he now has compensated hypercapnia consistent with none COPD. He continues to have increased oxygen requirement nasal cannula over baseline. However, he remains stable for the floor and there is no indication for BiPAP.  Plan to continue treatment for COPD with steroids, antibiotics, nebulizer therapy and oxygen.  Continue empiric antibiotics for pneumonia.  Recommend smoking cessation.  Code Status: full code DVT prophylaxis: heparin Family Communication: none present Disposition Plan: home with [facility], rolling walker  [name], MD  [facility]  Pager [phone] If 7PM-7AM, please contact night-coverage at [email or web address], password [facility] 02/25/2014, 5:09 PM  LOS: 1 day   Consultants:  Physical therapy: Home health, rolling walker. (Patient refused skilled nursing facility)  Procedures:    Antibiotics:  Azithromycin 11/3 >>  Ceftriaxone 11/12 >>  HPI/Subjective: Feels about the same. Still short of breath. Feels very weak generally.  Physical therapy recommended skilled nursing facility. Patient refused.  Objective: Filed Vitals:   02/25/14 0813 02/25/14 1237 02/25/14 1431 02/25/14 1606  BP:    116/68  Pulse:    101  Temp:    97.6 F (36.4  C)  TempSrc:    Oral  Resp:    20  Height:      Weight:      SpO2: 90% 88% 88% 95%    Intake/Output Summary (Last 24 hours) at 02/25/14 1709 Last data filed at 02/25/14 0900  Gross per 24 hour  Intake    240 ml  Output      0 ml  Net    240 ml     Filed Weights   02/24/14 1230 02/24/14 1647  Weight: 71.668 kg (158 lb) 74.39 kg (164 lb)    Exam:     Afebrile, vital signs stable. Hypoxia somewhat worse, on 4 L per min nasal cannula.  General: appears mildly anxious. Comfortable.  Psych: alert. Speech fluent and clear. Speaks in full sentences. Oriented to self, location. Reports he is illiterate and is not well oriented to month or year  CV: regular rate and rhythm. No murmur, rub or gallop.  Respiratory: bilateral wheezes. No rhonchi or rales. Fair air movement. Able to speak in full sentences.  Data Reviewed:  Initial ABG 7.339/67/59. Repeat ABG 7.409/52/55.  Basic metabolic panel, hepatic function panel unremarkable.  Modest leukocytosis, currently on steroids. Hemoglobin stable 12.8.  Urinalysis positive. Urine culture pending. Influenza screen negative.  Chest x-ray with diffuse interstitial opacities likely reflecting exacerbation of chronic interstitial lung disease or possible superimposed infectious process.  Scheduled Meds: . antiseptic oral rinse  7 mL Mouth Rinse BID  . aspirin EC  81 mg Oral q morning - 10a  . azithromycin  500 mg Oral Daily  . cefTRIAXone (ROCEPHIN)  IV  1 g Intravenous Q24H  . cholecalciferol  1,000 Units Oral q morning - 10a  . ipratropium-albuterol  3 mL Nebulization Q6H  . methylPREDNISolone (SOLU-MEDROL) injection  60 mg Intravenous  Q6H  . multivitamin with minerals  1 tablet Oral Daily  . pantoprazole  40 mg Oral Daily  . roflumilast  500 mcg Oral Daily  . simvastatin  40 mg Oral q morning - 10a  . tamsulosin  0.4 mg Oral q morning - 10a  . vitamin B-12  2,000 mcg Oral Daily   Continuous Infusions: . sodium chloride 75  mL/hr at 02/24/14 1526    Principal Problem:   Acute respiratory failure with hypercapnia Active Problems:   Essential hypertension   Chronic respiratory failure   CAP (community acquired pneumonia)   Tobacco dependence   COPD (chronic obstructive pulmonary disease)   COPD with acute exacerbation   Acute on chronic respiratory failure with hypoxia   Time spent 20 minutes

## 2014-02-25 NOTE — Care Management Note (Addendum)
    Page 1 of 2   02/28/2014     11:13:59 AM CARE MANAGEMENT NOTE 02/28/2014  Patient:  DARL, KUSS   Account Number:  [id number]  Date Initiated:  02/25/2014  Documentation initiated by:  Theophilus Kinds  Subjective/Objective Assessment:   Pt admitted from home with COPD. Pt lives with his wife and will return home at discharge. Pt takes care of his wife who is bedbound. Pt has a hospital bed, home O2, and neb machine.     Action/Plan:   PT recommends SNF vs HH and pt refuses any placement. Pt did agree to [facility] RN and Pt, aide and rolling walker. HH with [facility]. Romualdo Bolk of [facility] is aware and will collect pts information from the chart. [place] services to start within 48 hours of   Anticipated DC Date:  02/28/2014   Anticipated DC Plan:  [place]  CM consult      PAC Choice  [place]   Choice offered to / List presented to:  C-1 Patient   DME arranged  [place]      DME agency  [place] arranged  HH-1 RN  [place].   Status of service:  Completed, signed off Medicare Important Message given?  YES (If response is "NO", the following Medicare IM given date fields will be blank) Date Medicare IM given:  02/25/2014 Medicare IM given by:  Christinia Gully C Date Additional Medicare IM given:  02/28/2014 Additional Medicare IM given by:  Theophilus Kinds  Discharge Disposition:  [place]  Per UR Regulation:    If discussed at Long Length of Stay Meetings, dates discussed:    Comments:  02/28/14 [place], RN BSN CM Pt discharged home today with [facility] Rn and PT (per pts choice). Romualdo Bolk of [facility] is aware and will collect pts information from the chart. [place] services to start within 48 hours of discharge. Pts rolling walker has been delivered to pts room from [facility]. Pts daughter will be coming to pick up pt. Pt and pts nurse aware of discharge arrangements.  02/25/14 [place], RN BSN CM d/c. Rolling walke to be delivered to pts room prior to d/c from [place]. Will also make referral to [facility].

## 2014-02-26 MED ORDER — TRAZODONE HCL 50 MG PO TABS
50.0000 mg | ORAL_TABLET | Freq: Every day | ORAL | Status: DC
  Administered 2014-02-26 – 2014-02-27 (×2): 50 mg via ORAL
  Filled 2014-02-26 (×2): qty 1

## 2014-02-26 MED ORDER — KCL IN DEXTROSE-NACL 40-5-0.45 MEQ/L-%-% IV SOLN
INTRAVENOUS | Status: AC
  Filled 2014-02-26: qty 1000

## 2014-02-26 NOTE — Progress Notes (Signed)
PROGRESS NOTE  [name] [name] LFY:[id number] DOB: [birth year] DOA: 02/24/2014 PCP: [name], PA-C  Summary: 73 year old man with history of COPD, chronic hypoxic respiratory failure on oxygen, ongoing cigarette smoking who presented to the emergency room with increasing shortness of breath and cough that was productive.  Assessment/Plan: 1. Acute on chronic hypoxic respiratory failure, acute hypercapnic respiratory failure with respiratory acidosis. Multifactorial, COPD exacerbation, pneumonia. Hypoxia persists but acute hypercapnia has resolved. 2. COPD exacerbation, anticipate improvement will be slow, no significant change compared to yesterday. 3. Community acquired pneumonia. 4. Chronic hypoxic respiratory failure 5. Tobacco dependence. Recommend smoking cessation. 6. Illiterate    Overall remained stable without significant improvement yet. Plan to continue empiric antibiotics, bronchodilators, steroids without change today.  Code Status: full code DVT prophylaxis: heparin Family Communication: none present Disposition Plan: home with [place] PT, rolling walker  [name], MD  [facility]  Pager [phone] If 7PM-7AM, please contact night-coverage at [email or web address], password [facility] 02/26/2014, 5:05 PM  LOS: 2 days   Consultants:  Physical therapy: Home health, rolling walker. (Patient refused skilled nursing facility)  Procedures:    Antibiotics:  Azithromycin 11/3 >>  Ceftriaxone 11/12 >>  HPI/Subjective: Slept poorly last night. He remains short of breath, does not feel much better. Anxious about going home while family members are sick.  Objective: Filed Vitals:   02/25/14 2024 02/26/14 0427 02/26/14 0658 02/26/14 1429  BP: 119/52 129/61  126/72  Pulse: 92 64  86  Temp: 98.3 F (36.8 C) 97.8 F (36.6 C)  98 F (36.7 C)  TempSrc: Oral Oral  Oral  Resp: 18 20  20   Height:      Weight:      SpO2: 96% 95% 92% 96%    Intake/Output Summary  (Last 24 hours) at 02/26/14 1705 Last data filed at 02/26/14 1429  Gross per 24 hour  Intake    960 ml  Output      0 ml  Net    960 ml     Filed Weights   02/24/14 1230 02/24/14 1647  Weight: 71.668 kg (158 lb) 74.39 kg (164 lb)    Exam:     Afebrile, vital signs are stable. Stable hypoxia on 4 L.  Gen. Appears mildly anxious, comfortable. Nontoxic.  Psychiatric. Speech fluent, clear. Alert.  Cardiovascular regular rate and rhythm. No murmur, rub or gallop.  Respiratory poor breath sounds bilaterally with poor air movement. No frank wheezes, rales or rhonchi. Able to speak in full sentences. Mild to moderate increased respiratory effort.  Scheduled Meds: . antiseptic oral rinse  7 mL Mouth Rinse BID  . aspirin EC  81 mg Oral q morning - 10a  . azithromycin  500 mg Oral Daily  . cefTRIAXone (ROCEPHIN)  IV  1 g Intravenous Q24H  . cholecalciferol  1,000 Units Oral q morning - 10a  . heparin subcutaneous  5,000 Units Subcutaneous 3 times per day  . ipratropium-albuterol  3 mL Nebulization Q6H  . methylPREDNISolone (SOLU-MEDROL) injection  60 mg Intravenous Q6H  . multivitamin with minerals  1 tablet Oral Daily  . pantoprazole  40 mg Oral Daily  . roflumilast  500 mcg Oral Daily  . simvastatin  40 mg Oral q morning - 10a  . tamsulosin  0.4 mg Oral q morning - 10a  . traZODone  50 mg Oral QHS  . vitamin B-12  2,000 mcg Oral Daily   Continuous Infusions:    Principal Problem:   Acute respiratory failure  with hypercapnia Active Problems:   Essential hypertension   Chronic respiratory failure   CAP (community acquired pneumonia)   Tobacco dependence   COPD (chronic obstructive pulmonary disease)   COPD with acute exacerbation   Acute on chronic respiratory failure with hypoxia   Time spent 20 minutes

## 2014-02-27 MED ORDER — PREDNISONE 20 MG PO TABS
40.0000 mg | ORAL_TABLET | Freq: Every day | ORAL | Status: DC
  Administered 2014-02-28: 40 mg via ORAL
  Filled 2014-02-27: qty 2

## 2014-02-27 NOTE — Progress Notes (Signed)
PROGRESS NOTE  [name] [name]:[id number] DOB: 08-01-1940 DOA: 02/24/2014 PCP: [name], PA-C  Summary: 73 year old man with history of COPD, chronic hypoxic respiratory failure on oxygen, ongoing cigarette smoking who presented to the emergency room with increasing shortness of breath and cough that was productive.  Assessment/Plan: 1. Acute on chronic hypoxic respiratory failure, acute hypercapnic respiratory failure with respiratory acidosis. Multifactorial, COPD exacerbation, pneumonia. Hypoxia persists but acute hypercapnia has resolved. Starting to improve. 2. COPD exacerbation, definite improvement today, appears be clinically resolving. 3. Community acquired pneumonia. Afebrile, vital signs stable. Appears clinically resolved. 4. Chronic hypoxic respiratory failure 5. Tobacco dependence. Recommend smoking cessation. 6. Insomnia. Suspect steroid-induced. 7. Illiterate    Overall improving now, stable hypoxia. Plan to transition to oral steroids tomorrow, continue antibiotics, bronchodilators and oxygen.  Anticipate discharge next 1-2 days.  Code Status: full code DVT prophylaxis: heparin Family Communication: none present Disposition Plan: home with [place] PT, rolling walker  [name], MD  [facility]  Pager [phone] If 7PM-7AM, please contact night-coverage at [email or web address], password [facility] 02/27/2014, 3:41 PM  LOS: 3 days   Consultants:  Physical therapy: Home health, rolling walker. (Patient refused skilled nursing facility)  Procedures:    Antibiotics:  Azithromycin 11/13 >> 11/17  Ceftriaxone 11/12 >>  HPI/Subjective: Continued insomnia. However he is feeling better, breathing better.  Objective: Filed Vitals:   02/27/14 0547 02/27/14 0554 02/27/14 1319 02/27/14 1449  BP:  143/77  142/76  Pulse:  72  77  Temp:  98.1 F (36.7 C)  98.2 F (36.8 C)  TempSrc:  Oral  Oral  Resp:  18  18  Height:      Weight:      SpO2: 96% 94% 95%  96%    Intake/Output Summary (Last 24 hours) at 02/27/14 1541 Last data filed at 02/27/14 1449  Gross per 24 hour  Intake    480 ml  Output      0 ml  Net    480 ml     Filed Weights   02/24/14 1230 02/24/14 1647  Weight: 71.668 kg (158 lb) 74.39 kg (164 lb)    Exam:     Afebrile, vital signs are stable. Stable hypoxia on 4 L.  Appears calm, comfortable. Alert. Speech fluent and clear. Sitting in chair.  Respiratory clear to auscultation bilaterally. No frank wheezes, rales or rhonchi. Normal respiratory effort. Able to speak in full sentences.  Cardiovascular regular rate and rhythm. No murmur, rub or gallop.  Scheduled Meds: . antiseptic oral rinse  7 mL Mouth Rinse BID  . aspirin EC  81 mg Oral q morning - 10a  . azithromycin  500 mg Oral Daily  . cefTRIAXone (ROCEPHIN)  IV  1 g Intravenous Q24H  . cholecalciferol  1,000 Units Oral q morning - 10a  . heparin subcutaneous  5,000 Units Subcutaneous 3 times per day  . ipratropium-albuterol  3 mL Nebulization Q6H  . multivitamin with minerals  1 tablet Oral Daily  . pantoprazole  40 mg Oral Daily  . [START ON 02/28/2014] predniSONE  40 mg Oral Q breakfast  . roflumilast  500 mcg Oral Daily  . simvastatin  40 mg Oral q morning - 10a  . tamsulosin  0.4 mg Oral q morning - 10a  . traZODone  50 mg Oral QHS  . vitamin B-12  2,000 mcg Oral Daily   Continuous Infusions:    Principal Problem:   Acute respiratory failure with hypercapnia Active Problems:  Essential hypertension   Chronic respiratory failure   CAP (community acquired pneumonia)   Tobacco dependence   COPD (chronic obstructive pulmonary disease)   COPD with acute exacerbation   Acute on chronic respiratory failure with hypoxia   Time spent 15 minutes

## 2014-02-28 MED ORDER — CEFUROXIME AXETIL 500 MG PO TABS: 500.0000 mg | ORAL_TABLET | Freq: Two times a day (BID) | ORAL | Status: DC

## 2014-02-28 MED ORDER — PREDNISONE 10 MG PO TABS: ORAL_TABLET | ORAL | Status: DC

## 2014-02-28 MED ORDER — AZITHROMYCIN 250 MG PO TABS: 250.0000 mg | ORAL_TABLET | Freq: Every day | ORAL | Status: DC

## 2014-02-28 NOTE — Discharge Summary (Signed)
Physician Discharge Summary  [name] [name] QPR:[id number] DOB: September 22, 1940 DOA: 02/24/2014  PCP: [name], PA-C  Admit date: 02/24/2014 Discharge date: 02/28/2014  Recommendations for Outpatient Follow-up:  1. Resolution of COPD exacerbation and community-acquired pneumonia. 2. Continue to recommend smoking cessation.   Follow-up Information    Follow up with [facility].   Contact information:   [address] [place] [address] [phone]       Follow up with [name], PA-C On 03/02/2014.   Specialty:  General Practice   Why:  at 2:15 pm   Contact information:   [address] [place] [address] [phone]      Discharge Diagnoses:  1. Acute on chronic hypoxic respiratory failure 2. Acute hypercapnic respiratory failure with respiratory acidosis 3. COPD exacerbation 4. Community acquired pneumonia 5. Chronic hypoxic respiratory failure on home oxygen 3 L 6. Tobacco dependence  Discharge Condition: improved Disposition: home  Diet recommendation: regular  Filed Weights   02/24/14 1230 02/24/14 1647  Weight: 71.668 kg (158 lb) 74.39 kg (164 lb)    History of present illness:  73 year old man with history of COPD, chronic hypoxic respiratory failure on oxygen, ongoing cigarette smoking who presented to the emergency room with increasing shortness of breath and cough that was productive. Admitted for acute on chronic hypoxic respiratory failure, acute hypercapnic respiratory failure, COPD exacerbation and community-acquired pneumonia.  Hospital Course:  He was treated with empiric steroids, antibiotics and bronchodilators with gradual improvement in condition. Hospitalization was uncomplicated. Individual issues as below.  1. Acute on chronic hypoxic respiratory failure, acute hypercapnic respiratory failure with respiratory acidosis. Multifactorial, COPD exacerbation, pneumonia. All acute issues appears resolved.  Never required BiPAP. Oxygenation has been stable. 2. COPD exacerbation, appears clinically resolved. Treated with steroids, bronchodilators and antibiotics. 3. Community acquired pneumonia. Appears clinically resolved. Plan admission. Antibiotics. 4. Chronic hypoxic respiratory failure on oxygen at home. Close to baseline of 3 L. 5. Tobacco dependence. Recommend smoking cessation. 6. Insomnia. Suspect steroid-induced. 7. Illiterate. His wife takes care of all his medications and he has a good understanding of recommendations.  Discharge Instructions  Discharge Instructions    Diet - low sodium heart healthy    Complete by:  As directed      Discharge instructions    Complete by:  As directed   Call your physician or seek immediate medical attention for increased shortness of breath, fever, cough, pain or worsening of condition. Please stop smoking!     Increase activity slowly    Complete by:  As directed           Discharge Medication List as of 02/28/2014 11:03 AM    START taking these medications   Details  azithromycin (ZITHROMAX) 250 MG tablet Take 1 tablet (250 mg total) by mouth daily. Start 11/17 AM. One dose only., Starting 02/28/2014, Until Discontinued, Print    cefUROXime (CEFTIN) 500 MG tablet Take 1 tablet (500 mg total) by mouth 2 (two) times daily with a meal. Take first dose 11/16 at dinner time., Starting 02/28/2014, Until Discontinued, Print      CONTINUE these medications which have CHANGED   Details  predniSONE (DELTASONE) 10 MG tablet Start 11/17 AM . Take 40 mg by mouth daily for 3 days, then take 20 mg by mouth daily for 3 days, then take 10 mg by mouth daily for 3 days, then stop., Print      CONTINUE these medications which have NOT  CHANGED   Details  albuterol (PROVENTIL) (2.5 MG/3ML) 0.083% nebulizer solution Take 2.5 mg by nebulization every 4 (four) hours as needed. For shortness of breath, Until Discontinued, Historical Med      albuterol-ipratropium (COMBIVENT) 18-103 MCG/ACT inhaler Inhale 2 puffs into the lungs 4 (four) times daily., Starting 03/30/2013, Until Discontinued, Print    ALPRAZolam (XANAX) 1 MG tablet Take 0.5-1 mg by mouth 2 (two) times daily as needed for anxiety. anxiety, Starting 02/20/2013, Until Discontinued, Historical Med    aspirin EC 81 MG tablet Take 81 mg by mouth every morning. , Starting 10/02/2011, Until Discontinued, Historical Med    budesonide-formoterol (SYMBICORT) 160-4.5 MCG/ACT inhaler Inhale 2 puffs into the lungs 2 (two) times daily., Starting 03/30/2013, Until Discontinued, Print    Cholecalciferol (VITAMIN D3) 1000 UNITS CAPS Take 1 capsule by mouth every morning. , Until Discontinued, Historical Med    Cyanocobalamin (B-12) 2500 MCG TABS Take 1 tablet by mouth daily., Until Discontinued, Historical Med    DALIRESP 500 MCG TABS tablet Take 500 mcg by mouth daily., Starting 01/26/2014, Until Discontinued, Historical Med    esomeprazole (NEXIUM) 40 MG capsule Take 40 mg by mouth every morning., Until Discontinued, Historical Med    Garlic 7858 MG CAPS Take 1 capsule by mouth every morning. , Until Discontinued, Historical Med    HYDROcodone-acetaminophen (NORCO) 10-325 MG per tablet Take 1 tablet by mouth every 6 (six) hours as needed. For pain, Starting 06/14/2013, Until Discontinued, Historical Med    Multiple Vitamin (MULTIVITAMIN WITH MINERALS) TABS tablet Take 1 tablet by mouth daily., Until Discontinued, Historical Med    NITROSTAT 0.4 MG SL tablet Place 1 tablet under the tongue as needed for chest pain. , Starting 02/22/2013, Until Discontinued, Historical Med    polyethylene glycol powder (GLYCOLAX/MIRALAX) powder Take 17 g by mouth daily as needed for mild constipation or moderate constipation. , Until Discontinued, Historical Med    simvastatin (ZOCOR) 40 MG tablet Take 40 mg by mouth every morning. , Until Discontinued, Historical Med    tamsulosin (FLOMAX) 0.4 MG  CAPS capsule Take 0.4 mg by mouth every morning. , Starting 07/06/2013, Until Discontinued, Historical Med      STOP taking these medications     doxycycline (VIBRA-TABS) 100 MG tablet      levofloxacin (LEVAQUIN) 750 MG tablet        Allergies  Allergen Reactions  . Iohexol Hives     Code: HIVES, Desc: PT STATES HE BROKE OUT IN HIVES AND RASH AFTER CT NECK EARLY SEPT 2011; NO RESP PROBLEMS; NEEDS PRE-MEDS; MKS, Onset Date: [id number]   . Ivp Dye [Iodinated Diagnostic Agents]     The results of significant diagnostics from this hospitalization (including imaging, microbiology, ancillary and laboratory) are listed below for reference.    Significant Diagnostic Studies: Dg Chest 2 View  02/09/2014   CLINICAL DATA:  Shortness of breath, productive cough for 3 days. Brown yellow sputum. History of COPD, asthma.  EXAM: CHEST  2 VIEW  COMPARISON:  01/20/2014  FINDINGS: There is hyperinflation of the lungs compatible with COPD. Stable mild interstitial prominence, likely mild chronic interstitial changes. Heart is normal size. No confluent opacities or effusions. No acute bony abnormality.  IMPRESSION: COPD/chronic interstitial changes.  No acute findings.   Electronically Signed   By: Rolm Baptise M.D.   On: 02/09/2014 15:28   Dg Chest Portable 1 View  02/24/2014   CLINICAL DATA:  Shortness of breath beginning this morning. Wheezing. History of COPD  and asthma. Smoking history.  EXAM: PORTABLE CHEST - 1 VIEW  COMPARISON:  02/09/2014  FINDINGS: The cardiomediastinal silhouette is within normal limits. Lung volumes are unchanged. Diffuse interstitial densities throughout both lungs have mildly increased from the prior study with mild confluent opacity in the right lung base. No pleural effusion or pneumothorax is identified. No acute osseous abnormality is identified.  IMPRESSION: Diffuse interstitial opacities, mildly increased from the prior study. This may reflect an acute exacerbation of  underlying chronic interstitial lung disease or superimposed infectious pneumonitis.   Electronically Signed   By: Logan Bores   On: 02/24/2014 13:19    Microbiology: Recent Results (from the past 240 hour(s))  Urine culture     Status: None   Collection Time: 02/24/14  1:35 PM  Result Value Ref Range Status   Specimen Description URINE, CLEAN CATCH  Final   Special Requests NONE  Final   Culture  Setup Time   Final    02/24/2014 21:23 Performed at [facility] Performed at [facility]   Final   Culture NO GROWTH Performed at [facility]   Final   Report Status 02/25/2014 FINAL  Final     Labs: Basic Metabolic Panel:  Recent Labs Lab 02/24/14 1253 02/25/14 0605  NA 142 142  K 4.0 3.9  CL 97 99  CO2 38* 29  GLUCOSE 135* 175*  BUN 8 12  CREATININE 0.94 0.72  CALCIUM 9.7 9.7   Liver Function Tests:  Recent Labs Lab 02/25/14 0605  AST 15  ALT 21  ALKPHOS 70  BILITOT 0.3  PROT 6.6  ALBUMIN 3.2*   CBC:  Recent Labs Lab 02/24/14 1253 02/25/14 0605  WBC 16.4* 13.1*  NEUTROABS 11.3*  --   HGB 13.3 12.8*  HCT 41.0 39.2  MCV 96.0 94.0  PLT 193 204   Cardiac Enzymes:  Recent Labs Lab 02/24/14 1318  TROPONINI <0.30     Principal Problem:   Acute respiratory failure with hypercapnia Active Problems:   Essential hypertension   Chronic respiratory failure   CAP (community acquired pneumonia)   Tobacco dependence   COPD (chronic obstructive pulmonary disease)   COPD with acute exacerbation   Acute on chronic respiratory failure with hypoxia   Time coordinating discharge: 35 minutes  Signed:  [name], MD [facility] 02/28/2014, 6:22 PM

## 2014-02-28 NOTE — Progress Notes (Signed)
PROGRESS NOTE  [name] [name] PNT:[id number] DOB: [birth year] DOA: 02/24/2014 PCP: [name], PA-C   Late entry.  Summary: 73 year old man with history of COPD, chronic hypoxic respiratory failure on oxygen, ongoing cigarette smoking who presented to the emergency room with increasing shortness of breath and cough that was productive.  Assessment/Plan: 1. Acute on chronic hypoxic respiratory failure, acute hypercapnic respiratory failure with respiratory acidosis. Multifactorial, COPD exacerbation, pneumonia. All acute issues appears resolved. 2. COPD exacerbation, appears clinically resolved. 3. Community acquired pneumonia. Appears clinically resolved. 4. Chronic hypoxic respiratory failure on oxygen at home. Close to baseline of 3 L. 5. Tobacco dependence. Recommend smoking cessation. 6. Insomnia. Suspect steroid-induced. 7. Illiterate. His wife takes care of all his medications and he has a good understanding of recommendations.   Overall much improved. Plan home today, finish antibiotics and steroid taper. HH-1 RN  HH-2 PT  HH-10 DISEASE MANAGEMENT    [name], MD  [facility]  Pager [phone] If 7PM-7AM, please contact night-coverage at [email or web address], password [facility] 02/28/2014, 9:42 AM  LOS: 4 days   Consultants:  Physical therapy: Home health, rolling walker. (Patient refused skilled nursing facility)  Procedures:    Antibiotics:  Azithromycin 11/13 >> 11/17  Ceftriaxone 11/12 >> 11/19  HPI/Subjective: No issues charted overnight.  Feeling much better today. Breathing better. Poor sleep but thinks he will do better at home.  Objective: Filed Vitals:   02/27/14 2242 02/28/14 0132 02/28/14 0546 02/28/14 0714  BP: 173/86  146/77   Pulse: 69  61   Temp: 98.5 F (36.9 C)  98.3 F (36.8 C)   TempSrc: Oral  Oral   Resp: 18  18   Height:      Weight:      SpO2:  93% 94% 92%    Intake/Output Summary (Last 24 hours) at 02/28/14  0942 Last data filed at 02/28/14 0800  Gross per 24 hour  Intake    960 ml  Output      0 ml  Net    960 ml     Filed Weights   02/24/14 1230 02/24/14 1647  Weight: 71.668 kg (158 lb) 74.39 kg (164 lb)    Exam:     Afebrile, vital signs stable. Stable hypoxia.  Gen. Appears calm, comfortable. Speech fluent and clear.  cardiovascular regular rate and rhythm. No murmur, rub or gallop.  Respiratory clear to auscultation bilaterally. Fair air movement. No frank wheezes, rales or rhonchi. Able to speak in full sentences. No respiratory distress.  Scheduled Meds: . antiseptic oral rinse  7 mL Mouth Rinse BID  . aspirin EC  81 mg Oral q morning - 10a  . azithromycin  500 mg Oral Daily  . cefTRIAXone (ROCEPHIN)  IV  1 g Intravenous Q24H  . cholecalciferol  1,000 Units Oral q morning - 10a  . heparin subcutaneous  5,000 Units Subcutaneous 3 times per day  . ipratropium-albuterol  3 mL Nebulization Q6H  . multivitamin with minerals  1 tablet Oral Daily  . pantoprazole  40 mg Oral Daily  . predniSONE  40 mg Oral Q breakfast  . roflumilast  500 mcg Oral Daily  . simvastatin  40 mg Oral q morning - 10a  . tamsulosin  0.4 mg Oral q morning - 10a  . traZODone  50 mg Oral QHS  . vitamin B-12  2,000 mcg Oral Daily   Continuous Infusions:    Principal Problem:   Acute respiratory failure with hypercapnia Active Problems:  Essential hypertension   Chronic respiratory failure   CAP (community acquired pneumonia)   Tobacco dependence   COPD (chronic obstructive pulmonary disease)   COPD with acute exacerbation   Acute on chronic respiratory failure with hypoxia

## 2014-02-28 NOTE — Progress Notes (Signed)
Patient discharged home with daughter. Left the floor in wheelchair with oxygen, daughter states patient has oxygen tank in car and at home along with hospital bed ready at home for patient. All paperwork signed and copies given to patient. Patient off floor. William Hamburger RN

## 2014-05-04 ENCOUNTER — Inpatient Hospital Stay (HOSPITAL_COMMUNITY)
Admission: EM | Admit: 2014-05-04 | Discharge: 2014-05-08 | DRG: 190 | Disposition: A | Discharge status: Home Health | Payer: Medicare HMO | Attending: Family Medicine | Admitting: Family Medicine

## 2014-05-04 ENCOUNTER — Emergency Department (HOSPITAL_COMMUNITY): Payer: Medicare HMO

## 2014-05-04 ENCOUNTER — Encounter (HOSPITAL_COMMUNITY): Payer: Self-pay

## 2014-05-04 DIAGNOSIS — E780 Pure hypercholesterolemia: Secondary | ICD-10-CM | POA: Diagnosis present

## 2014-05-04 DIAGNOSIS — R739 Hyperglycemia, unspecified: Secondary | ICD-10-CM | POA: Diagnosis present

## 2014-05-04 DIAGNOSIS — E871 Hypo-osmolality and hyponatremia: Secondary | ICD-10-CM | POA: Diagnosis present

## 2014-05-04 DIAGNOSIS — Z720 Tobacco use: Secondary | ICD-10-CM | POA: Diagnosis present

## 2014-05-04 DIAGNOSIS — Z79891 Long term (current) use of opiate analgesic: Secondary | ICD-10-CM

## 2014-05-04 DIAGNOSIS — R0602 Shortness of breath: Secondary | ICD-10-CM

## 2014-05-04 DIAGNOSIS — J45909 Unspecified asthma, uncomplicated: Secondary | ICD-10-CM | POA: Diagnosis present

## 2014-05-04 DIAGNOSIS — J9611 Chronic respiratory failure with hypoxia: Secondary | ICD-10-CM | POA: Insufficient documentation

## 2014-05-04 DIAGNOSIS — F419 Anxiety disorder, unspecified: Secondary | ICD-10-CM | POA: Diagnosis present

## 2014-05-04 DIAGNOSIS — J9621 Acute and chronic respiratory failure with hypoxia: Secondary | ICD-10-CM | POA: Diagnosis present

## 2014-05-04 DIAGNOSIS — E785 Hyperlipidemia, unspecified: Secondary | ICD-10-CM | POA: Diagnosis present

## 2014-05-04 DIAGNOSIS — Z7982 Long term (current) use of aspirin: Secondary | ICD-10-CM

## 2014-05-04 DIAGNOSIS — F17210 Nicotine dependence, cigarettes, uncomplicated: Secondary | ICD-10-CM | POA: Diagnosis present

## 2014-05-04 DIAGNOSIS — J441 Chronic obstructive pulmonary disease with (acute) exacerbation: Secondary | ICD-10-CM | POA: Diagnosis present

## 2014-05-04 DIAGNOSIS — Z9981 Dependence on supplemental oxygen: Secondary | ICD-10-CM | POA: Diagnosis not present

## 2014-05-04 DIAGNOSIS — I10 Essential (primary) hypertension: Secondary | ICD-10-CM | POA: Diagnosis present

## 2014-05-04 DIAGNOSIS — Z825 Family history of asthma and other chronic lower respiratory diseases: Secondary | ICD-10-CM

## 2014-05-04 DIAGNOSIS — Z8550 Personal history of malignant neoplasm of unspecified urinary tract organ: Secondary | ICD-10-CM | POA: Diagnosis not present

## 2014-05-04 DIAGNOSIS — J069 Acute upper respiratory infection, unspecified: Secondary | ICD-10-CM | POA: Diagnosis present

## 2014-05-04 DIAGNOSIS — K219 Gastro-esophageal reflux disease without esophagitis: Secondary | ICD-10-CM | POA: Diagnosis present

## 2014-05-04 DIAGNOSIS — T380X5A Adverse effect of glucocorticoids and synthetic analogues, initial encounter: Secondary | ICD-10-CM | POA: Diagnosis present

## 2014-05-04 DIAGNOSIS — E876 Hypokalemia: Secondary | ICD-10-CM | POA: Diagnosis present

## 2014-05-04 DIAGNOSIS — Z7951 Long term (current) use of inhaled steroids: Secondary | ICD-10-CM

## 2014-05-04 LAB — CBC WITH DIFFERENTIAL/PLATELET
BASOS PCT: 0 % (ref 0–1)
Basophils Absolute: 0.1 10*3/uL (ref 0.0–0.1)
EOS PCT: 1 % (ref 0–5)
Eosinophils Absolute: 0.1 10*3/uL (ref 0.0–0.7)
HEMATOCRIT: 40.2 % (ref 39.0–52.0)
Hemoglobin: 13.7 g/dL (ref 13.0–17.0)
LYMPHS PCT: 35 % (ref 12–46)
Lymphs Abs: 4.3 10*3/uL — ABNORMAL HIGH (ref 0.7–4.0)
MCH: 31.1 pg (ref 26.0–34.0)
MCHC: 34.1 g/dL (ref 30.0–36.0)
MCV: 91.2 fL (ref 78.0–100.0)
MONO ABS: 0.9 10*3/uL (ref 0.1–1.0)
Monocytes Relative: 8 % (ref 3–12)
Neutro Abs: 6.8 10*3/uL (ref 1.7–7.7)
Neutrophils Relative %: 56 % (ref 43–77)
Platelets: 321 10*3/uL (ref 150–400)
RBC: 4.41 MIL/uL (ref 4.22–5.81)
RDW: 14.3 % (ref 11.5–15.5)
WBC: 12.1 10*3/uL — ABNORMAL HIGH (ref 4.0–10.5)

## 2014-05-04 LAB — BASIC METABOLIC PANEL
ANION GAP: 9 (ref 5–15)
BUN: 8 mg/dL (ref 6–23)
CO2: 27 mmol/L (ref 19–32)
Calcium: 9.5 mg/dL (ref 8.4–10.5)
Chloride: 94 mEq/L — ABNORMAL LOW (ref 96–112)
Creatinine, Ser: 0.82 mg/dL (ref 0.50–1.35)
GFR calc Af Amer: >90 mL/min (ref >90)
GFR calc non Af Amer: 86 mL/min — ABNORMAL LOW (ref >90)
Glucose, Bld: 125 mg/dL — ABNORMAL HIGH (ref 70–99)
Potassium: 3.3 mmol/L — ABNORMAL LOW (ref 3.5–5.1)
SODIUM: 130 mmol/L — AB (ref 135–145)

## 2014-05-04 LAB — URINALYSIS, ROUTINE W REFLEX MICROSCOPIC
BILIRUBIN URINE: NEGATIVE
Glucose, UA: NEGATIVE mg/dL
Ketones, ur: NEGATIVE mg/dL
LEUKOCYTES UA: NEGATIVE
NITRITE: NEGATIVE
PROTEIN: NEGATIVE mg/dL
Specific Gravity, Urine: 1.01 (ref 1.005–1.030)
UROBILINOGEN UA: 0.2 mg/dL (ref 0.0–1.0)
pH: 6.5 (ref 5.0–8.0)

## 2014-05-04 LAB — URINE MICROSCOPIC-ADD ON

## 2014-05-04 LAB — MAGNESIUM: Magnesium: 1.9 mg/dL (ref 1.5–2.5)

## 2014-05-04 MED ORDER — ONDANSETRON HCL 4 MG PO TABS: 4.0000 mg | ORAL_TABLET | Freq: Four times a day (QID) | ORAL | Status: DC | PRN

## 2014-05-04 MED ORDER — ROFLUMILAST 500 MCG PO TABS
500.0000 ug | ORAL_TABLET | Freq: Every day | ORAL | Status: DC
  Administered 2014-05-04 – 2014-05-08 (×5): 500 ug via ORAL
  Filled 2014-05-04 (×5): qty 1

## 2014-05-04 MED ORDER — MORPHINE SULFATE 2 MG/ML IJ SOLN: 1.0000 mg | INTRAMUSCULAR | Status: DC | PRN

## 2014-05-04 MED ORDER — METHYLPREDNISOLONE SODIUM SUCC 125 MG IJ SOLR
125.0000 mg | Freq: Once | INTRAMUSCULAR | Status: AC
  Administered 2014-05-04: 125 mg via INTRAVENOUS
  Filled 2014-05-04: qty 2

## 2014-05-04 MED ORDER — ALUM & MAG HYDROXIDE-SIMETH 200-200-20 MG/5ML PO SUSP: 30.0000 mL | Freq: Four times a day (QID) | ORAL | Status: DC | PRN

## 2014-05-04 MED ORDER — BUDESONIDE-FORMOTEROL FUMARATE 160-4.5 MCG/ACT IN AERO
2.0000 | INHALATION_SPRAY | Freq: Two times a day (BID) | RESPIRATORY_TRACT | Status: DC
  Administered 2014-05-04 – 2014-05-08 (×8): 2 via RESPIRATORY_TRACT
  Filled 2014-05-04: qty 6

## 2014-05-04 MED ORDER — TRAZODONE HCL 50 MG PO TABS
25.0000 mg | ORAL_TABLET | Freq: Every evening | ORAL | Status: DC | PRN
  Administered 2014-05-07: 25 mg via ORAL
  Filled 2014-05-04 (×2): qty 1

## 2014-05-04 MED ORDER — TAMSULOSIN HCL 0.4 MG PO CAPS
0.4000 mg | ORAL_CAPSULE | Freq: Every morning | ORAL | Status: DC
Start: 2014-05-04 — End: 2014-05-08
  Administered 2014-05-04 – 2014-05-08 (×5): 0.4 mg via ORAL
  Filled 2014-05-04 (×5): qty 1

## 2014-05-04 MED ORDER — NITROGLYCERIN 0.4 MG SL SUBL: 0.4000 mg | SUBLINGUAL_TABLET | SUBLINGUAL | Status: DC | PRN

## 2014-05-04 MED ORDER — ONDANSETRON HCL 4 MG/2ML IJ SOLN: 4.0000 mg | Freq: Four times a day (QID) | INTRAMUSCULAR | Status: DC | PRN

## 2014-05-04 MED ORDER — LORATADINE 10 MG PO TABS
10.0000 mg | ORAL_TABLET | Freq: Every day | ORAL | Status: DC
  Administered 2014-05-04 – 2014-05-08 (×5): 10 mg via ORAL
  Filled 2014-05-04 (×5): qty 1

## 2014-05-04 MED ORDER — HYDROCODONE-ACETAMINOPHEN 10-325 MG PO TABS
1.0000 | ORAL_TABLET | Freq: Four times a day (QID) | ORAL | Status: DC | PRN
  Administered 2014-05-04 (×2): 1 via ORAL
  Filled 2014-05-04 (×2): qty 1

## 2014-05-04 MED ORDER — PANTOPRAZOLE SODIUM 40 MG PO TBEC
40.0000 mg | DELAYED_RELEASE_TABLET | Freq: Every day | ORAL | Status: DC
  Administered 2014-05-04 – 2014-05-08 (×5): 40 mg via ORAL
  Filled 2014-05-04 (×5): qty 1

## 2014-05-04 MED ORDER — METHYLPREDNISOLONE SODIUM SUCC 125 MG IJ SOLR
60.0000 mg | Freq: Four times a day (QID) | INTRAMUSCULAR | Status: DC
  Administered 2014-05-04 – 2014-05-05 (×3): 60 mg via INTRAVENOUS
  Filled 2014-05-04 (×4): qty 2

## 2014-05-04 MED ORDER — BISACODYL 5 MG PO TBEC: 5.0000 mg | DELAYED_RELEASE_TABLET | Freq: Every day | ORAL | Status: DC | PRN

## 2014-05-04 MED ORDER — SODIUM CHLORIDE 0.9 % IV SOLN
INTRAVENOUS | Status: DC
  Administered 2014-05-04: 12:00:00 via INTRAVENOUS

## 2014-05-04 MED ORDER — ASPIRIN EC 81 MG PO TBEC
81.0000 mg | DELAYED_RELEASE_TABLET | Freq: Every morning | ORAL | Status: DC
  Administered 2014-05-04 – 2014-05-08 (×5): 81 mg via ORAL
  Filled 2014-05-04 (×5): qty 1

## 2014-05-04 MED ORDER — SIMVASTATIN 20 MG PO TABS
40.0000 mg | ORAL_TABLET | Freq: Every evening | ORAL | Status: DC
  Administered 2014-05-05: 20 mg via ORAL
  Administered 2014-05-06 – 2014-05-07 (×2): 40 mg via ORAL
  Filled 2014-05-04 (×3): qty 2

## 2014-05-04 MED ORDER — CETYLPYRIDINIUM CHLORIDE 0.05 % MT LIQD
7.0000 mL | Freq: Two times a day (BID) | OROMUCOSAL | Status: DC
Start: 2014-05-04 — End: 2014-05-08
  Administered 2014-05-04 – 2014-05-08 (×8): 7 mL via OROMUCOSAL

## 2014-05-04 MED ORDER — ALBUTEROL SULFATE (2.5 MG/3ML) 0.083% IN NEBU: 2.5000 mg | INHALATION_SOLUTION | RESPIRATORY_TRACT | Status: DC

## 2014-05-04 MED ORDER — ACETAMINOPHEN 650 MG RE SUPP: 650.0000 mg | Freq: Four times a day (QID) | RECTAL | Status: DC | PRN

## 2014-05-04 MED ORDER — IPRATROPIUM-ALBUTEROL 0.5-2.5 (3) MG/3ML IN SOLN
3.0000 mL | Freq: Once | RESPIRATORY_TRACT | Status: AC
  Administered 2014-05-04: 3 mL via RESPIRATORY_TRACT
  Filled 2014-05-04: qty 3

## 2014-05-04 MED ORDER — ADULT MULTIVITAMIN W/MINERALS CH
1.0000 | ORAL_TABLET | Freq: Every day | ORAL | Status: DC
  Administered 2014-05-04 – 2014-05-08 (×5): 1 via ORAL
  Filled 2014-05-04 (×5): qty 1

## 2014-05-04 MED ORDER — GUAIFENESIN-DM 100-10 MG/5ML PO SYRP
5.0000 mL | ORAL_SOLUTION | ORAL | Status: DC | PRN
  Administered 2014-05-06: 5 mL via ORAL
  Filled 2014-05-04: qty 5

## 2014-05-04 MED ORDER — IPRATROPIUM-ALBUTEROL 0.5-2.5 (3) MG/3ML IN SOLN
3.0000 mL | RESPIRATORY_TRACT | Status: DC
  Administered 2014-05-04 – 2014-05-05 (×3): 3 mL via RESPIRATORY_TRACT
  Filled 2014-05-04 (×3): qty 3

## 2014-05-04 MED ORDER — POLYETHYLENE GLYCOL 3350 17 GM/SCOOP PO POWD: 17.0000 g | Freq: Every day | ORAL | Status: DC | PRN

## 2014-05-04 MED ORDER — ACETAMINOPHEN 325 MG PO TABS: 650.0000 mg | ORAL_TABLET | Freq: Four times a day (QID) | ORAL | Status: DC | PRN

## 2014-05-04 MED ORDER — ENOXAPARIN SODIUM 40 MG/0.4ML ~~LOC~~ SOLN
40.0000 mg | Freq: Every day | SUBCUTANEOUS | Status: DC
  Administered 2014-05-04 – 2014-05-08 (×5): 40 mg via SUBCUTANEOUS
  Filled 2014-05-04 (×5): qty 0.4

## 2014-05-04 MED ORDER — CHLORHEXIDINE GLUCONATE 0.12 % MT SOLN
15.0000 mL | Freq: Two times a day (BID) | OROMUCOSAL | Status: DC
  Administered 2014-05-04 – 2014-05-08 (×7): 15 mL via OROMUCOSAL
  Filled 2014-05-04 (×7): qty 15

## 2014-05-04 MED ORDER — SODIUM CHLORIDE 0.9 % IV BOLUS (SEPSIS)
1000.0000 mL | Freq: Once | INTRAVENOUS | Status: AC
  Administered 2014-05-04: 1000 mL via INTRAVENOUS

## 2014-05-04 MED ORDER — IPRATROPIUM BROMIDE 0.02 % IN SOLN: 0.5000 mg | RESPIRATORY_TRACT | Status: DC

## 2014-05-04 MED ORDER — POLYETHYLENE GLYCOL 3350 17 G PO PACK: 17.0000 g | PACK | Freq: Every day | ORAL | Status: DC | PRN

## 2014-05-04 MED ORDER — SODIUM CHLORIDE 0.9 % IV SOLN
INTRAVENOUS | Status: DC
  Administered 2014-05-04 – 2014-05-05 (×2): 1 mL via INTRAVENOUS

## 2014-05-04 MED ORDER — ALPRAZOLAM 0.5 MG PO TABS
0.5000 mg | ORAL_TABLET | Freq: Two times a day (BID) | ORAL | Status: DC | PRN
  Administered 2014-05-04 – 2014-05-08 (×13): 0.5 mg via ORAL
  Filled 2014-05-04 (×3): qty 1
  Filled 2014-05-04: qty 2
  Filled 2014-05-04 (×7): qty 1
  Filled 2014-05-04: qty 2

## 2014-05-04 MED ORDER — LEVOFLOXACIN IN D5W 750 MG/150ML IV SOLN
750.0000 mg | INTRAVENOUS | Status: DC
  Administered 2014-05-05: 750 mg via INTRAVENOUS
  Filled 2014-05-04: qty 150

## 2014-05-04 MED ORDER — POTASSIUM CHLORIDE CRYS ER 20 MEQ PO TBCR
40.0000 meq | EXTENDED_RELEASE_TABLET | Freq: Once | ORAL | Status: AC
  Administered 2014-05-04: 40 meq via ORAL
  Filled 2014-05-04: qty 2

## 2014-05-04 MED ORDER — ALBUTEROL SULFATE (2.5 MG/3ML) 0.083% IN NEBU
2.5000 mg | INHALATION_SOLUTION | RESPIRATORY_TRACT | Status: DC | PRN
  Administered 2014-05-08: 2.5 mg via RESPIRATORY_TRACT

## 2014-05-04 MED ORDER — LEVOFLOXACIN IN D5W 750 MG/150ML IV SOLN
750.0000 mg | Freq: Once | INTRAVENOUS | Status: AC
  Administered 2014-05-04: 750 mg via INTRAVENOUS
  Filled 2014-05-04: qty 150

## 2014-05-04 NOTE — H&P (Signed)
[name] [name] History and Physical  [name] [name]:[id number] DOB: Dec 03, 1940 DOA: 05/04/2014  Referring physician:  PCP: [name], PA-C   Chief Complaint: sob  HPI: [name] [name] is a 74 y.o. male with past medical history of chronic respiratory failure secondary to COPD on home oxygen and continues to smoke presents to the emergency department with the chief complaint of worsening shortness of breath. Initial evaluation in the emergency department reveals acute on chronic respiratory failure with hypoxia secondary to COPD exacerbation.  He reports worsening shortness of breath started 4 days ago. He went to urgent care anemia and was given on inhaler but says he hasn't used it. He has a nebulizer machine at home and he has used it but reports it has not helped. Satiated symptoms include subjective fever with chills nausea without vomiting generalized weakness. He denies chest pain palpitations headache visual disturbances. He denies abdominal pain vomiting diarrhea constipation dysuria hematuria frequency or urgency. He does report a moist productive cough with thick sputum.  Workup in the emergency room includes a complete blood count significant for sodium of 130 potassium 3.3 serum glucose 125, complete blood count significant for the PVCs a 12.1, urinalysis unremarkable, is x-ray with stable chronic lung disease, EKG with sinus rhythm.  In the emergency department he is given DuoNeb nebulizer treatment and 125 mg of Solu-Medrol as well as 1 L of normal saline. At the time of my exam he is resting fairly comfortably he is hemodynamically stable afebrile with oxygen saturation 99% on 2 L nasal cannula.  Review of Systems:  10 point review of systems completed and all systems are negative except as indicated in history of present illness Past Medical History  Diagnosis Date  . COPD (chronic obstructive pulmonary disease)   . Asthma   . Tobacco abuse 09/29/2011  .  Hypercholesteremia   . GERD (gastroesophageal reflux disease)   . Headache(784.0)   . BPH (benign prostatic hyperplasia) 10/01/2011    Per cystoscopy  . 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  . Pulmonary nodule 09/2011    Stable appearance  . Chronic back pain   . Chronic anxiety   . Hyperlipidemia 03/13/2012  . Anxiety 03/13/2012  . On home O2     2L N/C continuous  . Emphysema lung    Past Surgical History  Procedure Laterality Date  . Cystoscopy  09/30/2011    Procedure: CYSTOSCOPY FLEXIBLE;  Surgeon: Marissa Nestle, MD;  Location: [facility];  Service: Urology;  Laterality: N/A;  . Transurethral resection of bladder tumor  10/01/2011    Procedure: TRANSURETHRAL RESECTION OF BLADDER TUMOR (TURBT);  Surgeon: Marissa Nestle, MD;  Location: [facility];  Service: Urology;  Laterality: N/A;  . Cardiac catheterization  2003    [name] [name]  . Fracture surgery      R ring finger, pin placed   Social History:  reports that he quit smoking about 2 months ago. His smoking use included Cigarettes. He has a 58 pack-year smoking history. He does not have any smokeless tobacco history on file. He reports that he does not drink alcohol or use illicit drugs. He lives alone and reports that his wife left him last week he has children who live in the area he is retired Allergies  Allergen Reactions  . Iohexol Hives     Code: HIVES, Desc: PT STATES HE BROKE OUT IN HIVES AND RASH AFTER CT NECK EARLY SEPT 2011; NO RESP PROBLEMS;  NEEDS PRE-MEDS; MKS, Onset Date: [id number]   . Ivp Dye [Iodinated Diagnostic Agents] Hives    Family History  Problem Relation Age of Onset  . Emphysema Brother     was not close to family; does not truly know their medical problems.  . Emphysema Brother   . Emphysema Brother   . Cancer Neg Hx      Prior to Admission medications   Medication Sig Start Date End Date Taking? Authorizing Provider  albuterol (PROVENTIL) (2.5 MG/3ML) 0.083% nebulizer solution  Take 2.5 mg by nebulization every 4 (four) hours as needed. For shortness of breath   Yes Historical Provider, MD  albuterol-ipratropium (COMBIVENT) 18-103 MCG/ACT inhaler Inhale 2 puffs into the lungs 4 (four) times daily. 03/30/13  Yes Rexene Alberts, MD  ALPRAZolam Duanne Moron) 1 MG tablet Take 0.5-1 mg by mouth 2 (two) times daily as needed for anxiety. anxiety 02/20/13  Yes Domenic Polite, MD  aspirin EC 81 MG tablet Take 81 mg by mouth every morning.  10/02/11  Yes Rexene Alberts, MD  budesonide-formoterol [facility]) 160-4.5 MCG/ACT inhaler Inhale 2 puffs into the lungs 2 (two) times daily. 03/30/13  Yes Rexene Alberts, MD  Cholecalciferol (VITAMIN D3) 1000 UNITS CAPS Take 1 capsule by mouth every morning.    Yes Historical Provider, MD  Cyanocobalamin (B-12) 2500 MCG TABS Take 1 tablet by mouth daily.   Yes Historical Provider, MD  DALIRESP 500 MCG TABS tablet Take 500 mcg by mouth daily. 01/26/14  Yes Historical Provider, MD  esomeprazole (NEXIUM) 40 MG capsule Take 40 mg by mouth every morning.   Yes Historical Provider, MD  Garlic 5621 MG CAPS Take 1 capsule by mouth every morning.    Yes Historical Provider, MD  HYDROcodone-acetaminophen (NORCO) 10-325 MG per tablet Take 1 tablet by mouth every 6 (six) hours as needed. For pain 06/14/13  Yes Historical Provider, MD  loratadine (CLARITIN) 10 MG tablet Take 10 mg by mouth daily.   Yes Historical Provider, MD  Multiple Vitamin (MULTIVITAMIN WITH MINERALS) TABS tablet Take 1 tablet by mouth daily.   Yes Historical Provider, MD  NITROSTAT 0.4 MG SL tablet Place 1 tablet under the tongue as needed for chest pain.  02/22/13  Yes Historical Provider, MD  polyethylene glycol powder (GLYCOLAX/MIRALAX) powder Take 17 g by mouth daily as needed for mild constipation or moderate constipation.    Yes Historical Provider, MD  simvastatin (ZOCOR) 40 MG tablet Take 40 mg by mouth every morning.    Yes Historical Provider, MD  tamsulosin (FLOMAX) 0.4 MG CAPS capsule  Take 0.4 mg by mouth every morning.  07/06/13  Yes Historical Provider, MD  azithromycin (ZITHROMAX) 250 MG tablet Take 1 tablet (250 mg total) by mouth daily. Start 11/17 AM. One dose only. Patient not taking: Reported on 05/04/2014 02/28/14   Samuella Cota, MD  cefUROXime (CEFTIN) 500 MG tablet Take 1 tablet (500 mg total) by mouth 2 (two) times daily with a meal. Take first dose 11/16 at dinner time. Patient not taking: Reported on 05/04/2014 02/28/14   Samuella Cota, MD  predniSONE (DELTASONE) 10 MG tablet Start 11/17 AM . Take 40 mg by mouth daily for 3 days, then take 20 mg by mouth daily for 3 days, then take 10 mg by mouth daily for 3 days, then stop. Patient not taking: Reported on 05/04/2014 02/28/14   Samuella Cota, MD   Physical Exam: Filed Vitals:   05/04/14 1200 05/04/14 1230 05/04/14 1256 05/04/14 1300  BP:  130/72 129/80  138/88  Pulse: 80 81  84  Temp:      TempSrc:      Resp: 14 18  27   SpO2: 95% 98% 99% 100%    Wt Readings from Last 3 Encounters:  02/24/14 74.39 kg (164 lb)  02/11/14 78.7 kg (173 lb 8 oz)  01/20/14 77.565 kg (171 lb)    General:  Appears calm and comfortable, somewhat unkept Eyes: PERRL, normal lids, irises & conjunctiva ENT: grossly normal hearing, does membranes of his mouth are pink, somewhat dry tongue with pale sick film on the edges Neck: no LAD, masses or thyromegaly Cardiovascular: RRR, no m/r/g. No LE edema. Pedal pulses present and palpable  Respiratory: Normal effort with conversation. Breath sounds are quite diminished throughout but I do hear faint expiratory wheezing throughout as well no crackles. Abdomen: soft, ntnd Skin: no rash or induration seen on limited exam Musculoskeletal: grossly normal tone BUE/BLE Psychiatric: grossly normal mood and affect, speech fluent and appropriate Neurologic: grossly non-focal.          Labs on Admission:  Basic Metabolic Panel:  Recent Labs Lab 05/04/14 0841  NA 130*  K 3.3*    CL 94*  CO2 27  GLUCOSE 125*  BUN 8  CREATININE 0.82  CALCIUM 9.5   Liver Function Tests: No results for input(s): AST, ALT, ALKPHOS, BILITOT, PROT, ALBUMIN in the last 168 hours. No results for input(s): LIPASE, AMYLASE in the last 168 hours. No results for input(s): AMMONIA in the last 168 hours. CBC:  Recent Labs Lab 05/04/14 0841  WBC 12.1*  NEUTROABS 6.8  HGB 13.7  HCT 40.2  MCV 91.2  PLT 321   Cardiac Enzymes: No results for input(s): CKTOTAL, CKMB, CKMBINDEX, TROPONINI in the last 168 hours.  BNP (last 3 results)  Recent Labs  07/30/13 1810 02/09/14 1611  PROBNP 101.9 115.9   CBG: No results for input(s): GLUCAP in the last 168 hours.  Radiological Exams on Admission: Dg Chest Portable 1 View  05/04/2014   CLINICAL DATA:  73 year old male with acute shortness of Breath. Current history of COPD, smoker. Initial encounter.  EXAM: PORTABLE CHEST - 1 VIEW  COMPARISON:  02/24/2014 and earlier.  FINDINGS: Portable AP upright view at 0831 hrs. Stable lung volumes. Stable cardiac size and mediastinal contours. No pneumothorax, pleural effusion or consolidation. Stable chronically increased coarse interstitial markings. No acute pulmonary opacity.  IMPRESSION: Stable chronic lung disease.  No acute cardiopulmonary abnormality.   Electronically Signed   By: Lars Pinks M.D.   On: 05/04/2014 08:41    EKG: Independently reviewed sinus rhythm with no acute changes  Assessment/Plan Principal Problem:   Acute on chronic respiratory failure with hypoxia: Related to COPD exacerbation. Patient has frequent admissions for the same. Will admit. Will continue oxygen supplementation, Solu-Medrol, scheduled nebulizers and Levaquin. Active Problems:  COPD with acute exacerbation: Patient is on home oxygen at 2-1/2 L. Will get the sense disease completely compliant with all his medications. He continues to smoke. See #1. If no improvement will consider pulmonary consult    Essential  hypertension: Controlled. Continue home medications    Tobacco abuse: Cessation counseling offered.  Hypokalemia. Will replete and recheck  Hyponatremia mild. Gentle IV fluids. Recheck in the a.m.  Hyperglycemia: Likely related to steroids will monitor and use sliding scale insulin as indicated    Code Status: full DVT Prophylaxis: Family Communication: none present Disposition Plan: home when ready  Time spent: 65 minutes  [name] M  [name] [name] Pager [phone]

## 2014-05-04 NOTE — ED Notes (Signed)
Complain of being SOB since Sunday. States he went to [facility] in [place] and was given albuterol but has not used it. Per EMS, pt was given two albuterol breathing tx prior to arrival

## 2014-05-04 NOTE — ED Provider Notes (Signed)
CSN: [id number]     Arrival date & time 05/04/14  0814 History  This chart was scribed for [name] Sorrow, MD by Lowella Petties, ED Scribe. The patient was seen in room APA04/APA04. Patient's care was started at 8:45 AM.      Chief Complaint  Patient presents with  . Shortness of Breath   Patient is a 74 y.o. male presenting with shortness of breath. The history is provided by the patient. No language interpreter was used.  Shortness of Breath Severity:  Moderate Onset quality:  Gradual Duration:  7 days Timing:  Constant Progression:  Worsening Chronicity:  New Relieved by:  Oxygen Associated symptoms: chest pain, cough, fever and sore throat   Associated symptoms: no abdominal pain, no headaches, no neck pain, no rash and no vomiting   HPI Comments: [name] [name] is a 73 y.o. male who is on 2.5L O2 at home and was brought by EMS to the Emergency Department complaining of SOB. He reports associated fever, chills, reduction in appetite, and productive cough. He states that he was given a penicillin shot and an Albuterol inhaler when he was seen at the [facility] in [place] for his symptoms on Sunday. He did not fill his inhaler prescription. EMS states that he was given two albuterol Tx PTA.    PCP: Theodoro Clock  Past Medical History  Diagnosis Date  . COPD (chronic obstructive pulmonary disease)   . Asthma   . Tobacco abuse 09/29/2011  . Hypercholesteremia   . GERD (gastroesophageal reflux disease)   . Headache(784.0)   . BPH (benign prostatic hyperplasia) 10/01/2011    Per cystoscopy  . 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  . Pulmonary nodule 09/2011    Stable appearance  . Chronic back pain   . Chronic anxiety   . Hyperlipidemia 03/13/2012  . Anxiety 03/13/2012  . On home O2     2L N/C continuous  . Emphysema lung    Past Surgical History  Procedure Laterality Date  . Cystoscopy  09/30/2011    Procedure: CYSTOSCOPY FLEXIBLE;  Surgeon: Marissa Nestle, MD;  Location: [facility];  Service: Urology;  Laterality: N/A;  . Transurethral resection of bladder tumor  10/01/2011    Procedure: TRANSURETHRAL RESECTION OF BLADDER TUMOR (TURBT);  Surgeon: Marissa Nestle, MD;  Location: [facility];  Service: Urology;  Laterality: N/A;  . Cardiac catheterization  2003    [facility]  . Fracture surgery      R ring finger, pin placed   Family History  Problem Relation Age of Onset  . Emphysema Brother     was not close to family; does not truly know their medical problems.  . Emphysema Brother   . Emphysema Brother   . Cancer Neg Hx    History  Substance Use Topics  . Smoking status: Former Smoker -- 1.00 packs/day for 58 years    Types: Cigarettes    Quit date: 02/17/2014  . Smokeless tobacco: Not on file  . Alcohol Use: No    Review of Systems  Constitutional: Positive for fever, chills and appetite change.  HENT: Positive for rhinorrhea and sore throat.   Eyes: Positive for visual disturbance.  Respiratory: Positive for cough and shortness of breath.   Cardiovascular: Positive for chest pain and leg swelling.  Gastrointestinal: Positive for nausea. Negative for vomiting, abdominal pain and diarrhea.  Genitourinary: Positive for dysuria.  Musculoskeletal: Positive for arthralgias. Negative for back pain  and neck pain.  Skin: Negative for rash.  Neurological: Negative for dizziness, light-headedness and headaches.  Hematological: Does not bruise/bleed easily.  Psychiatric/Behavioral: Negative for confusion.   Allergies  Iohexol and Ivp dye  Home Medications   Prior to Admission medications   Medication Sig Start Date End Date Taking? Authorizing Provider  albuterol (PROVENTIL) (2.5 MG/3ML) 0.083% nebulizer solution Take 2.5 mg by nebulization every 4 (four) hours as needed. For shortness of breath   Yes Historical Provider, MD  albuterol-ipratropium (COMBIVENT) 18-103 MCG/ACT inhaler Inhale 2 puffs into the lungs 4 (four) times daily.  03/30/13  Yes Rexene Alberts, MD  ALPRAZolam Duanne Moron) 1 MG tablet Take 0.5-1 mg by mouth 2 (two) times daily as needed for anxiety. anxiety 02/20/13  Yes Domenic Polite, MD  aspirin EC 81 MG tablet Take 81 mg by mouth every morning.  10/02/11  Yes Rexene Alberts, MD  budesonide-formoterol [facility]) 160-4.5 MCG/ACT inhaler Inhale 2 puffs into the lungs 2 (two) times daily. 03/30/13  Yes Rexene Alberts, MD  Cholecalciferol (VITAMIN D3) 1000 UNITS CAPS Take 1 capsule by mouth every morning.    Yes Historical Provider, MD  Cyanocobalamin (B-12) 2500 MCG TABS Take 1 tablet by mouth daily.   Yes Historical Provider, MD  DALIRESP 500 MCG TABS tablet Take 500 mcg by mouth daily. 01/26/14  Yes Historical Provider, MD  esomeprazole (NEXIUM) 40 MG capsule Take 40 mg by mouth every morning.   Yes Historical Provider, MD  Garlic 1610 MG CAPS Take 1 capsule by mouth every morning.    Yes Historical Provider, MD  HYDROcodone-acetaminophen (NORCO) 10-325 MG per tablet Take 1 tablet by mouth every 6 (six) hours as needed. For pain 06/14/13  Yes Historical Provider, MD  loratadine (CLARITIN) 10 MG tablet Take 10 mg by mouth daily.   Yes Historical Provider, MD  Multiple Vitamin (MULTIVITAMIN WITH MINERALS) TABS tablet Take 1 tablet by mouth daily.   Yes Historical Provider, MD  NITROSTAT 0.4 MG SL tablet Place 1 tablet under the tongue as needed for chest pain.  02/22/13  Yes Historical Provider, MD  polyethylene glycol powder (GLYCOLAX/MIRALAX) powder Take 17 g by mouth daily as needed for mild constipation or moderate constipation.    Yes Historical Provider, MD  simvastatin (ZOCOR) 40 MG tablet Take 40 mg by mouth every morning.    Yes Historical Provider, MD  tamsulosin (FLOMAX) 0.4 MG CAPS capsule Take 0.4 mg by mouth every morning.  07/06/13  Yes Historical Provider, MD  azithromycin (ZITHROMAX) 250 MG tablet Take 1 tablet (250 mg total) by mouth daily. Start 11/17 AM. One dose only. Patient not taking: Reported on  05/04/2014 02/28/14   [name] Cota, MD  cefUROXime (CEFTIN) 500 MG tablet Take 1 tablet (500 mg total) by mouth 2 (two) times daily with a meal. Take first dose 11/16 at dinner time. Patient not taking: Reported on 05/04/2014 02/28/14   [name] Cota, MD  predniSONE (DELTASONE) 10 MG tablet Start 11/17 AM . Take 40 mg by mouth daily for 3 days, then take 20 mg by mouth daily for 3 days, then take 10 mg by mouth daily for 3 days, then stop. Patient not taking: Reported on 05/04/2014 02/28/14   [name] Cota, MD   Triage Vitals: BP 146/92 mmHg  Pulse 93  Temp(Src) 98.2 F (36.8 C) (Oral)  Resp 26  SpO2 97% Physical Exam  Constitutional: He is oriented to person, place, and time. He appears well-developed and well-nourished. No distress.  HENT:  Head: Normocephalic and atraumatic.  Mucus membranes dry  Eyes: Conjunctivae and EOM are normal. Pupils are equal, round, and reactive to light. No scleral icterus.  Neck: Neck supple. No tracheal deviation present.  Cardiovascular: Normal rate, regular rhythm and normal heart sounds.   No murmur heard. Pulmonary/Chest: Effort normal and breath sounds normal. No respiratory distress. He has no wheezes. He has no rales.  Abdominal: Soft. Bowel sounds are normal. He exhibits no distension and no mass. There is no tenderness. There is no rebound and no guarding.  Musculoskeletal: Normal range of motion. He exhibits no edema.  Neurological: He is alert and oriented to person, place, and time. He has normal reflexes. No cranial nerve deficit.  Skin: Skin is warm and dry.  Psychiatric: He has a normal mood and affect. His behavior is normal.  Nursing note and vitals reviewed.   ED Course  Procedures (including critical care time) DIAGNOSTIC STUDIES: 8:53 AM: Oxygen Saturation is 95% on 2L O2, adequate by my interpretation.    COORDINATION OF CARE: 8:56 AM-Discussed treatment plan which includes CXR, EKG, and lab work with pt at bedside  and pt agreed to plan.   Results for orders placed or performed during the hospital encounter of 05/04/14  CBC with Differential  Result Value Ref Range   WBC 12.1 (H) 4.0 - 10.5 K/uL   RBC 4.41 4.22 - 5.81 MIL/uL   Hemoglobin 13.7 13.0 - 17.0 g/dL   HCT 40.2 39.0 - 52.0 %   MCV 91.2 78.0 - 100.0 fL   MCH 31.1 26.0 - 34.0 pg   MCHC 34.1 30.0 - 36.0 g/dL   RDW 14.3 11.5 - 15.5 %   Platelets 321 150 - 400 K/uL   Neutrophils Relative % 56 43 - 77 %   Neutro Abs 6.8 1.7 - 7.7 K/uL   Lymphocytes Relative 35 12 - 46 %   Lymphs Abs 4.3 (H) 0.7 - 4.0 K/uL   Monocytes Relative 8 3 - 12 %   Monocytes Absolute 0.9 0.1 - 1.0 K/uL   Eosinophils Relative 1 0 - 5 %   Eosinophils Absolute 0.1 0.0 - 0.7 K/uL   Basophils Relative 0 0 - 1 %   Basophils Absolute 0.1 0.0 - 0.1 K/uL   WBC Morphology ATYPICAL LYMPHOCYTES   Basic metabolic panel  Result Value Ref Range   Sodium 130 (L) 135 - 145 mmol/L   Potassium 3.3 (L) 3.5 - 5.1 mmol/L   Chloride 94 (L) 96 - 112 mEq/L   CO2 27 19 - 32 mmol/L   Glucose, Bld 125 (H) 70 - 99 mg/dL   BUN 8 6 - 23 mg/dL   Creatinine, Ser 0.82 0.50 - 1.35 mg/dL   Calcium 9.5 8.4 - 10.5 mg/dL   GFR calc non Af Amer 86 (L) >90 mL/min   GFR calc Af Amer >90 >90 mL/min   Anion gap 9 5 - 15  Urinalysis, Routine w reflex microscopic  Result Value Ref Range   Color, Urine YELLOW YELLOW   APPearance CLEAR CLEAR   Specific Gravity, Urine 1.010 1.005 - 1.030   pH 6.5 5.0 - 8.0   Glucose, UA NEGATIVE NEGATIVE mg/dL   Hgb urine dipstick TRACE (A) NEGATIVE   Bilirubin Urine NEGATIVE NEGATIVE   Ketones, ur NEGATIVE NEGATIVE mg/dL   Protein, ur NEGATIVE NEGATIVE mg/dL   Urobilinogen, UA 0.2 0.0 - 1.0 mg/dL   Nitrite NEGATIVE NEGATIVE   Leukocytes, UA NEGATIVE NEGATIVE  Urine microscopic-add on  Result Value Ref Range   RBC / HPF 0-2 <3 RBC/hpf   Dg Chest Portable 1 View  05/04/2014   CLINICAL DATA:  74 year old male with acute shortness of Breath. Current history of  COPD, smoker. Initial encounter.  EXAM: PORTABLE CHEST - 1 VIEW  COMPARISON:  02/24/2014 and earlier.  FINDINGS: Portable AP upright view at 0831 hrs. Stable lung volumes. Stable cardiac size and mediastinal contours. No pneumothorax, pleural effusion or consolidation. Stable chronically increased coarse interstitial markings. No acute pulmonary opacity.  IMPRESSION: Stable chronic lung disease.  No acute cardiopulmonary abnormality.   Electronically Signed   By: Lars Pinks M.D.   On: 05/04/2014 08:41    Medications  0.9 %  sodium chloride infusion ( Intravenous New Bag/Given 05/04/14 1157)  ipratropium-albuterol (DUONEB) 0.5-2.5 (3) MG/3ML nebulizer solution 3 mL (not administered)  sodium chloride 0.9 % bolus 1,000 mL (0 mLs Intravenous Stopped 05/04/14 1115)  ipratropium-albuterol (DUONEB) 0.5-2.5 (3) MG/3ML nebulizer solution 3 mL (3 mLs Nebulization Given 05/04/14 0938)  methylPREDNISolone sodium succinate (SOLU-MEDROL) 125 mg/2 mL injection 125 mg (125 mg Intravenous Given 05/04/14 1032)     EKG Interpretation   Date/Time:  Wednesday May 04 2014 08:20:41 EST Ventricular Rate:  92 PR Interval:  185 QRS Duration: 96 QT Interval:  363 QTC Calculation: 449 R Axis:   91 Text Interpretation:  Sinus rhythm Right axis deviation Borderline low  voltage, extremity leads Baseline wander in lead(s) III No significant  change since last tracing Artifact Confirmed by Jada Kuhnert  MD, Jezel Basto  ([id number]) on 05/04/2014 9:07:47 AM      MDM   Final diagnoses:  Shortness of breath  COPD with acute exacerbation    Patient with known COPD probably also complicated by upper respiratory infection. Patient with exacerbation of COPD still wheezing despite the realize her treatment by EMS and here. Patient started on Cytomel drawl. Patient is normally on 2-1/2 L of oxygen at home he does have oxygen at home he has albuterol at home. Patient currently not on steroids. Patient will require admission for  exacerbation of the COPD because the wheezing is persistent.    I personally performed the services described in this documentation, which was scribed in my presence. The recorded information has been reviewed and is accurate.     [name] Sorrow, MD 05/06/14 [phone]

## 2014-05-04 NOTE — Progress Notes (Signed)
ANTIBIOTIC CONSULT NOTE - INITIAL  Pharmacy Consult for Levaquin Indication: COPD exacerbation  Allergies  Allergen Reactions  . Iohexol Hives     Code: HIVES, Desc: PT STATES HE BROKE OUT IN HIVES AND RASH AFTER CT NECK EARLY SEPT 2011; NO RESP PROBLEMS; NEEDS PRE-MEDS; MKS, Onset Date: [id number]   . Ivp Dye [Iodinated Diagnostic Agents] Hives   Patient Measurements: Height: 5' 2.5" (158.8 cm) Weight: 160 lb (72.576 kg) IBW/kg (Calculated) : 55.75  Vital Signs: Temp: 98.2 F (36.8 C) (01/20 1341) Temp Source: Oral (01/20 1341) BP: 140/90 mmHg (01/20 1354) Pulse Rate: 103 (01/20 1341) Intake/Output from previous day:   Intake/Output from this shift:    Labs:  Recent Labs  05/04/14 0841  WBC 12.1*  HGB 13.7  PLT 321  CREATININE 0.82   Estimated Creatinine Clearance: 72 mL/min (by C-G formula based on Cr of 0.82). No results for input(s): VANCOTROUGH, VANCOPEAK, VANCORANDOM, GENTTROUGH, GENTPEAK, GENTRANDOM, TOBRATROUGH, TOBRAPEAK, TOBRARND, AMIKACINPEAK, AMIKACINTROU, AMIKACIN in the last 72 hours.   Microbiology: No results found for this or any previous visit (from the past 720 hour(s)).  Medical History: Past Medical History  Diagnosis Date  . COPD (chronic obstructive pulmonary disease)   . Asthma   . Tobacco abuse 09/29/2011  . Hypercholesteremia   . GERD (gastroesophageal reflux disease)   . Headache(784.0)   . BPH (benign prostatic hyperplasia) 10/01/2011    Per cystoscopy  . 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  . Pulmonary nodule 09/2011    Stable appearance  . Chronic back pain   . Chronic anxiety   . Hyperlipidemia 03/13/2012  . Anxiety 03/13/2012  . On home O2     2L N/C continuous  . Emphysema lung    Anti-infectives    Start     Dose/Rate Route Frequency Ordered Stop   05/05/14 1600  levofloxacin (LEVAQUIN) IVPB 750 mg     750 mg100 mL/hr over 90 Minutes Intravenous Every 24 hours 05/04/14 1355     05/04/14 1500   levofloxacin (LEVAQUIN) IVPB 750 mg     750 mg100 mL/hr over 90 Minutes Intravenous  Once 05/04/14 1336        Assessment: 74yo male with h/o COPD and home oxygen.  Pt admitted with exacerbation.  Asked to initiate Levaquin.  Estimated Creatinine Clearance: 72 mL/min (by C-G formula based on Cr of 0.82).  Goal of Therapy:  Eradicate infection.  Plan:  Levaquin 750mg  IV q24hrs Switch to PO when appropriate Monitor labs and progress  Hart Robinsons A 05/04/2014,1:56 PM

## 2014-05-04 NOTE — Plan of Care (Signed)
Problem: Phase I Progression Outcomes Goal: Pain controlled Outcome: Progressing Chronic pain - PRN meds given as needed and as patient manages at home

## 2014-05-04 NOTE — ED Notes (Signed)
Pt to receive breathing treatment prior to admission to floor.

## 2014-05-05 LAB — GLUCOSE, CAPILLARY
Glucose-Capillary: 338 mg/dL — ABNORMAL HIGH (ref 70–99)
Glucose-Capillary: 142 mg/dL — ABNORMAL HIGH (ref 70–99)
Glucose-Capillary: 130 mg/dL — ABNORMAL HIGH (ref 70–99)

## 2014-05-05 LAB — BASIC METABOLIC PANEL
Anion gap: 9 (ref 5–15)
BUN: 9 mg/dL (ref 6–23)
CALCIUM: 9.8 mg/dL (ref 8.4–10.5)
CO2: 27 mmol/L (ref 19–32)
Chloride: 98 mEq/L (ref 96–112)
Creatinine, Ser: 0.8 mg/dL (ref 0.50–1.35)
GFR calc non Af Amer: 87 mL/min — ABNORMAL LOW (ref >90)
Glucose, Bld: 177 mg/dL — ABNORMAL HIGH (ref 70–99)
Potassium: 3.8 mmol/L (ref 3.5–5.1)
Sodium: 134 mmol/L — ABNORMAL LOW (ref 135–145)

## 2014-05-05 LAB — CBC
HCT: 35.1 % — ABNORMAL LOW (ref 39.0–52.0)
HEMOGLOBIN: 11.8 g/dL — AB (ref 13.0–17.0)
MCH: 30.8 pg (ref 26.0–34.0)
MCHC: 33.6 g/dL (ref 30.0–36.0)
MCV: 91.6 fL (ref 78.0–100.0)
PLATELETS: 303 10*3/uL (ref 150–400)
RBC: 3.83 MIL/uL — ABNORMAL LOW (ref 4.22–5.81)
RDW: 14.4 % (ref 11.5–15.5)
WBC: 9.2 10*3/uL (ref 4.0–10.5)

## 2014-05-05 LAB — HEMOGLOBIN A1C
Hgb A1c MFr Bld: 5.7 % — ABNORMAL HIGH (ref <5.7)
Mean Plasma Glucose: 117 mg/dL — ABNORMAL HIGH (ref <117)

## 2014-05-05 LAB — EXPECTORATED SPUTUM ASSESSMENT W GRAM STAIN, RFLX TO RESP C

## 2014-05-05 LAB — EXPECTORATED SPUTUM ASSESSMENT W REFEX TO RESP CULTURE

## 2014-05-05 MED ORDER — INSULIN ASPART 100 UNIT/ML ~~LOC~~ SOLN: 0.0000 [IU] | Freq: Every day | SUBCUTANEOUS | Status: DC

## 2014-05-05 MED ORDER — IPRATROPIUM-ALBUTEROL 0.5-2.5 (3) MG/3ML IN SOLN
3.0000 mL | Freq: Four times a day (QID) | RESPIRATORY_TRACT | Status: DC
  Administered 2014-05-05 – 2014-05-08 (×12): 3 mL via RESPIRATORY_TRACT
  Filled 2014-05-05 (×14): qty 3

## 2014-05-05 MED ORDER — METHYLPREDNISOLONE SODIUM SUCC 125 MG IJ SOLR
60.0000 mg | Freq: Two times a day (BID) | INTRAMUSCULAR | Status: DC
  Administered 2014-05-05 – 2014-05-06 (×2): 60 mg via INTRAVENOUS
  Filled 2014-05-05: qty 2

## 2014-05-05 MED ORDER — HYDROCODONE-ACETAMINOPHEN 10-325 MG PO TABS
1.0000 | ORAL_TABLET | Freq: Four times a day (QID) | ORAL | Status: DC | PRN
  Administered 2014-05-05 – 2014-05-08 (×11): 1 via ORAL
  Filled 2014-05-05 (×11): qty 1

## 2014-05-05 MED ORDER — INSULIN ASPART 100 UNIT/ML ~~LOC~~ SOLN
0.0000 [IU] | Freq: Three times a day (TID) | SUBCUTANEOUS | Status: DC
  Administered 2014-05-05: 15 [IU] via SUBCUTANEOUS
  Administered 2014-05-05 – 2014-05-06 (×2): 3 [IU] via SUBCUTANEOUS
  Administered 2014-05-06: 2 [IU] via SUBCUTANEOUS
  Administered 2014-05-07 – 2014-05-08 (×3): 3 [IU] via SUBCUTANEOUS

## 2014-05-05 NOTE — Progress Notes (Signed)
Inpatient Diabetes Program Recommendations  AACE/ADA: New Consensus Statement on Inpatient Glycemic Control (2013)  Target Ranges:  Prepandial:   less than 140 mg/dL      Peak postprandial:   less than 180 mg/dL (1-2 hours)      Critically ill patients:  140 - 180 mg/dL    Results for AYLEN, RAMBERT (MRN [id number]) as of 05/05/2014 13:08  Ref. Range 05/05/2014 11:12  Glucose-Capillary Latest Range: 70-99 mg/dL 338 (H)  Results for TREYVION, DURKEE (MRN [id number]) as of 05/05/2014 13:08  Ref. Range 05/05/2014 06:37  Glucose Latest Range: 70-99 mg/dL 177 (H)   Diabetes history: No Outpatient Diabetes medications: NA Current orders for Inpatient glycemic control: Novolog 0-20 TID with meals, Novolog 0-5 units QHS  Inpatient Diabetes Program Recommendations Correction (SSI): Noted CBGs and Novolog correction started today. HgbA1C: Please consider ordering an A1C to evaluate glycemic control over the past 2-3 months. Diet: May want to consider adding Carb Modified to current Heart Healthy diet.  Note: Patient does not have a documented history of diabetes. Patient is ordered Solumedrol 60 mg Q12H which is likely cause of hyperglycemia. Noted Novolog correction was ordered this morning. Please consider ordering an A1C and may want to consider adding Carb Modified to current diet.  Thanks, Barnie Alderman, RN, MSN, CCRN, CDE Diabetes Coordinator Inpatient Diabetes Program [phone] (Team Pager) [phone] (AP office) [phone] [facility] office)

## 2014-05-05 NOTE — Consult Note (Signed)
Received EPIC referral for [facility] services. Patient does not have a [facility] Provider listed. Therefore not eligible for [facility] Care Management services at this time. Left voicemail for inpatient RNCM to make aware.  Marthenia Rolling, MSN- [facility] [phone]

## 2014-05-05 NOTE — Evaluation (Signed)
Physical Therapy Evaluation Patient Details Name: [name] [name] MRN: [id number] DOB: 01-05-1941 Today's Date: 05/05/2014   History of Present Illness  [name] [name] is a 74 y.o. male with past medical history of chronic respiratory failure secondary to COPD on home oxygen and continues to smoke presents to the emergency department with the chief complaint of worsening shortness of breath. Initial evaluation in the emergency department reveals acute on chronic respiratory failure with hypoxia secondary to COPD exacerbation  Clinical Impression   Pt was seen for evaluation.  He was alert and very cooperative but somewhat anxious.  His significant other has left him recently and he expresses great sadness over this.  At times, he will begin to shake all over when speaking of his personal situation and becomes tearful as he states that he has not even been able to bathe due to this sadness.  Functionally, his strength is WNL.  He is deconditioned at baseline and requires a walker for gait.  He is chronically on supplemental O2.  He would be very appropriate for ACLF but he refuses this.  I would therefore recommend HHPT to develop maximal mobility/safety at home.    Follow Up Recommendations Home health PT    Equipment Recommendations  None recommended by PT    Recommendations for Other Services   none    Precautions / Restrictions Precautions Precautions: None Restrictions Weight Bearing Restrictions: No      Mobility  Bed Mobility Overal bed mobility: Modified Independent                Transfers Overall transfer level: Modified independent Equipment used: Rolling walker (2 wheeled)                Ambulation/Gait Ambulation/Gait assistance: Modified independent (Device/Increase time) Ambulation Distance (Feet): 100 Feet Assistive device: Rolling walker (2 wheeled) Gait Pattern/deviations: WFL(Within Functional Limits)   Gait velocity interpretation: at or above  normal speed for age/gender General Gait Details: gait with walker is stable  Stairs            Wheelchair Mobility    Modified Rankin (Stroke Patients Only)       Balance Overall balance assessment: No apparent balance deficits (not formally assessed)                                           Pertinent Vitals/Pain Pain Assessment: No/denies pain    Home Living Family/patient expects to be discharged to:: Unsure (home v.s. ACLF)   Available Help at Discharge: Family;Available PRN/intermittently Type of Home: House Home Access: Ramped entrance     Home Layout: One level Home Equipment: Walker - 2 wheels;Cane - single point Additional Comments: girlfriend has recently moved out of the home...pt is extremely depressed about this    Prior Function Level of Independence: Independent with assistive device(s)         Comments: pt uses a walker or cane for gait     Hand Dominance        Extremity/Trunk Assessment   Upper Extremity Assessment: Overall WFL for tasks assessed           Lower Extremity Assessment: Overall WFL for tasks assessed         Communication   Communication: No difficulties  Cognition Arousal/Alertness: Awake/alert Behavior During Therapy: Anxious Overall Cognitive Status: Within Functional Limits for tasks assessed  General Comments      Exercises        Assessment/Plan    PT Assessment Patient needs continued PT services  PT Diagnosis Difficulty walking   PT Problem List Decreased activity tolerance;Decreased mobility  PT Treatment Interventions Gait training;Therapeutic activities   PT Goals (Current goals can be found in the Care Plan section) Acute Rehab PT Goals Patient Stated Goal: none stated PT Goal Formulation: With patient Time For Goal Achievement: 05/19/14 Potential to Achieve Goals: Good    Frequency Min 3X/week   Barriers to discharge Decreased  caregiver support      Co-evaluation               End of Session Equipment Utilized During Treatment: Gait belt;Oxygen Activity Tolerance: Patient tolerated treatment well Patient left: in chair;with call bell/phone within reach Nurse Communication: Mobility status         Time: 1165-7903 PT Time Calculation (min) (ACUTE ONLY): 27 min   Charges:   PT Evaluation $Initial PT Evaluation Tier I: 1 Procedure     PT G CodesSable Feil 05/05/2014, 10:49 AM

## 2014-05-05 NOTE — Care Management Note (Addendum)
    Page 1 of 2   05/06/2014     10:16:13 AM CARE MANAGEMENT NOTE 05/06/2014  Patient:  [name] [name], [name] [name]   Account Number:  [id number]  Date Initiated:  05/05/2014  Documentation initiated by:  Theophilus Kinds  Subjective/Objective Assessment:   Pt admitted from home with COPD. Pt lives alone as wife/girlfriend is no longer in the home. Pt has a son and daughter who are very active in the care of the pt. Pt stated that his daughter was going to stay with him 3 nights a week.     Action/Plan:   Pt active with [facility] OT and CSW. Will add RN and pT at discharge. [facility] referral made as well. P thas home o2 and neb machine as well as a rolling walker for home use.   Anticipated DC Date:  05/08/2014   Anticipated DC Plan:  [place]  CM consult      [facility] Choice  Resumption Of Svcs/PTA Provider   Choice offered to / List presented to:  C-1 Patient        [place] arranged  HH-1 RN  HH-10 DISEASE MANAGEMENT  HH-2 PT  HH-3 OT  [place]      [place].   Status of service:  Completed, signed off Medicare Important Message given?  YES (If response is "NO", the following Medicare IM given date fields will be blank) Date Medicare IM given:  05/06/2014 Medicare IM given by:  Vladimir Creeks Date Additional Medicare IM given:   Additional Medicare IM given by:    Discharge Disposition:  [place]  Per UR Regulation:    If discussed at Long Length of Stay Meetings, dates discussed:    Comments:  05/06/14 [place], RN BSN CM Anticipate discharge over the weekend. Pt has all DME needs verbalized. Pt will resume [place] services with [facility]. Weekend staff will call and fax orders at discharge. No other CM needs noted. Pt and pts nurse aware of discharge arrangements.  05/05/14 [place], RN BSN CM

## 2014-05-05 NOTE — Progress Notes (Signed)
Late entry 05/05/14 at 2200, paged on-call MD, pt was requesting his Xanax early. Spoke with on-call MD and got permission to give the second dose early.

## 2014-05-05 NOTE — Progress Notes (Signed)
UR chart review completed.  

## 2014-05-05 NOTE — Progress Notes (Signed)
Up in chair eating, anxious about going to assisted living.

## 2014-05-05 NOTE — Progress Notes (Signed)
[facility] PROGRESS NOTE  NYAL SCHACHTER FIE:[id number] DOB: [birth year] DOA: 05/04/2014 PCP: [name], PA-C  Assessment/Plan: Acute on chronic respiratory failure with hypoxia: Related to COPD exacerbation. Some improvement approaching baseline. Will decrease Solu-Medrol and continue scheduled nebulizers and Levaquin. Active Problems: COPD with acute exacerbation: Patient is on home oxygen at 2-1/2 L. Oxygen saturation level >90% on 2L which is baseline.  See #1. Does get very sob with exertion but air flow improved. Less wheezes   Essential hypertension: Controlled. Continue home medications   Tobacco abuse: Cessation counseling offered.  Hypokalemia. resolved  Hyponatremia. Improving. monitor  1. Hyperglycemia: Likely related to steroids will monitor and use sliding scale insulin as indicated  Code Status: full Family Communication: none present Disposition Plan: would benefit from assisted living but refuses.    Consultants:  none  Procedures:  non  Antibiotics:  Levaquin  HPI/Subjective: Up to chair with increased work of breathing. Feeling "weak"  Objective: Filed Vitals:   05/05/14 0500  BP: 132/78  Pulse: 82  Temp: 98.6 F (37 C)  Resp: 19    Intake/Output Summary (Last 24 hours) at 05/05/14 0908 Last data filed at 05/05/14 0457  Gross per 24 hour  Intake    240 ml  Output   2475 ml  Net  -2235 ml   Filed Weights   05/04/14 1341  Weight: 72.576 kg (160 lb)    Exam:   General:  Chronically ill appearing no distress  Cardiovascular: RRR no MGR no LE edema  Respiratory: increased work of breathing with exertion. Improved air flow no wheeze  Abdomen: soft +BS non-tender  Musculoskeletal: no clubbing or cyanosis   Data Reviewed: Basic Metabolic Panel:  Recent Labs Lab 05/04/14 0841 05/05/14 0637  NA 130* 134*  K 3.3* 3.8  CL 94* 98  CO2 27 27  GLUCOSE 125* 177*  BUN 8 9  CREATININE 0.82 0.80  CALCIUM 9.5 9.8   MG 1.9  --    Liver Function Tests: No results for input(s): AST, ALT, ALKPHOS, BILITOT, PROT, ALBUMIN in the last 168 hours. No results for input(s): LIPASE, AMYLASE in the last 168 hours. No results for input(s): AMMONIA in the last 168 hours. CBC:  Recent Labs Lab 05/04/14 0841 05/05/14 0637  WBC 12.1* 9.2  NEUTROABS 6.8  --   HGB 13.7 11.8*  HCT 40.2 35.1*  MCV 91.2 91.6  PLT 321 303   Cardiac Enzymes: No results for input(s): CKTOTAL, CKMB, CKMBINDEX, TROPONINI in the last 168 hours. BNP (last 3 results)  Recent Labs  07/30/13 1810 02/09/14 1611  PROBNP 101.9 115.9   CBG: No results for input(s): GLUCAP in the last 168 hours.  Recent Results (from the past 240 hour(s))  Culture, expectorated sputum-assessment     Status: None   Collection Time: 05/05/14  2:39 AM  Result Value Ref Range Status   Specimen Description Expect. Sput  Final   Special Requests NONE  Final   Sputum evaluation   Final    THIS SPECIMEN IS ACCEPTABLE. RESPIRATORY CULTURE REPORT TO FOLLOW.   Report Status 05/05/2014 FINAL  Final     Studies: Dg Chest Portable 1 View  05/04/2014   CLINICAL DATA:  74 year old male with acute shortness of Breath. Current history of COPD, smoker. Initial encounter.  EXAM: PORTABLE CHEST - 1 VIEW  COMPARISON:  02/24/2014 and earlier.  FINDINGS: Portable AP upright view at 0831 hrs. Stable lung volumes. Stable cardiac size and mediastinal contours. No pneumothorax, pleural  effusion or consolidation. Stable chronically increased coarse interstitial markings. No acute pulmonary opacity.  IMPRESSION: Stable chronic lung disease.  No acute cardiopulmonary abnormality.   Electronically Signed   By: Lars Pinks M.D.   On: 05/04/2014 08:41    Scheduled Meds: . antiseptic oral rinse  7 mL Mouth Rinse q12n4p  . aspirin EC  81 mg Oral q morning - 10a  . budesonide-formoterol  2 puff Inhalation BID  . chlorhexidine  15 mL Mouth Rinse BID  . enoxaparin (LOVENOX) injection   40 mg Subcutaneous Daily  . insulin aspart  0-20 Units Subcutaneous TID WC  . insulin aspart  0-5 Units Subcutaneous QHS  . ipratropium-albuterol  3 mL Nebulization Q6H  . levofloxacin (LEVAQUIN) IV  750 mg Intravenous Q24H  . loratadine  10 mg Oral Daily  . methylPREDNISolone (SOLU-MEDROL) injection  60 mg Intravenous Q12H  . multivitamin with minerals  1 tablet Oral Daily  . pantoprazole  40 mg Oral Daily  . roflumilast  500 mcg Oral Daily  . simvastatin  40 mg Oral QPM  . tamsulosin  0.4 mg Oral q morning - 10a   Continuous Infusions: . sodium chloride 1 mL (05/04/14 1439)    Principal Problem:   Acute on chronic respiratory failure with hypoxia Active Problems:   Essential hypertension   Tobacco abuse   COPD exacerbation   COPD with acute exacerbation    Time spent: [place] [facility] Pager [phone]. If 7PM-7AM, please contact night-coverage at [email or web address], password [facility] 05/05/2014, 9:08 AM  LOS: 1 day

## 2014-05-06 DIAGNOSIS — J9611 Chronic respiratory failure with hypoxia: Secondary | ICD-10-CM | POA: Insufficient documentation

## 2014-05-06 LAB — GLUCOSE, CAPILLARY
GLUCOSE-CAPILLARY: 111 mg/dL — AB (ref 70–99)
Glucose-Capillary: 144 mg/dL — ABNORMAL HIGH (ref 70–99)
Glucose-Capillary: 140 mg/dL — ABNORMAL HIGH (ref 70–99)
Glucose-Capillary: 132 mg/dL — ABNORMAL HIGH (ref 70–99)

## 2014-05-06 MED ORDER — PREDNISONE 20 MG PO TABS
40.0000 mg | ORAL_TABLET | Freq: Every day | ORAL | Status: DC
  Administered 2014-05-07: 40 mg via ORAL
  Administered 2014-05-08: 20 mg via ORAL
  Administered 2014-05-08: 40 mg via ORAL
  Filled 2014-05-06 (×2): qty 2

## 2014-05-06 MED ORDER — LEVOFLOXACIN 500 MG PO TABS
500.0000 mg | ORAL_TABLET | Freq: Every day | ORAL | Status: DC
  Administered 2014-05-06 – 2014-05-08 (×3): 500 mg via ORAL
  Filled 2014-05-06 (×3): qty 1

## 2014-05-06 NOTE — Progress Notes (Signed)
[facility] PROGRESS NOTE  [name] [name] TDD:[id number] DOB: 1941-01-08 DOA: 05/04/2014 PCP: [name], PA-C  Assessment/Plan: Acute on chronic respiratory failure with hypoxia: Related to COPD exacerbation. Continues to improve albeit slowly. Is ambulating to BR without problem.  Will change Solu-Medrol to prednisone. Will  continue scheduled nebulizers and Levaquin day #3. Will change levaquin to po as well.  Active Problems: COPD with acute exacerbation: Patient is on home oxygen at 2-1/2 L. Oxygen saturation level >90% on 3L which is baseline. See #1.  DOE improving. Fewer wheezes   Essential hypertension: Controlled. Continue home medications   Tobacco abuse: Cessation counseling offered.  Hypokalemia. resolved  Hyponatremia. Improving. monitor  1. Hyperglycemia: Likely related to steroids. Will improve as steroids tapered.    Code Status: full Family Communication: none present Disposition Plan: home hopefully 24-48 hours   Consultants:  none  Procedures:  none  Antibiotics:  Levaquin 05/04/14>>>  HPI/Subjective: Sitting up in bed eating breakfast. Reports breathing "a little better".  Objective: Filed Vitals:   05/06/14 0512  BP: 137/74  Pulse: 77  Temp: 98.9 F (37.2 C)  Resp: 18    Intake/Output Summary (Last 24 hours) at 05/06/14 0927 Last data filed at 05/06/14 0513  Gross per 24 hour  Intake    480 ml  Output   1200 ml  Net   -720 ml   Filed Weights   05/04/14 1341  Weight: 72.576 kg (160 lb)    Exam:   General:  Appears comfortable but chronically ill  Cardiovascular: RRR No MGR no LE edema  Respiratory: normal effort with conversation, air flow improved. i hear no wheeze  Abdomen: soft +BS non-tender to palpation  Musculoskeletal: no clubbing or cyanosis   Data Reviewed: Basic Metabolic Panel:  Recent Labs Lab 05/04/14 0841 05/05/14 0637  NA 130* 134*  K 3.3* 3.8  CL 94* 98  CO2 27 27  GLUCOSE 125*  177*  BUN 8 9  CREATININE 0.82 0.80  CALCIUM 9.5 9.8  MG 1.9  --    Liver Function Tests: No results for input(s): AST, ALT, ALKPHOS, BILITOT, PROT, ALBUMIN in the last 168 hours. No results for input(s): LIPASE, AMYLASE in the last 168 hours. No results for input(s): AMMONIA in the last 168 hours. CBC:  Recent Labs Lab 05/04/14 0841 05/05/14 0637  WBC 12.1* 9.2  NEUTROABS 6.8  --   HGB 13.7 11.8*  HCT 40.2 35.1*  MCV 91.2 91.6  PLT 321 303   Cardiac Enzymes: No results for input(s): CKTOTAL, CKMB, CKMBINDEX, TROPONINI in the last 168 hours. BNP (last 3 results)  Recent Labs  07/30/13 1810 02/09/14 1611  PROBNP 101.9 115.9   CBG:  Recent Labs Lab 05/05/14 1112 05/05/14 1708 05/05/14 2020 05/06/14 0738  GLUCAP 338* 142* 130* 144*    Recent Results (from the past 240 hour(s))  Culture, expectorated sputum-assessment     Status: None   Collection Time: 05/05/14  2:39 AM  Result Value Ref Range Status   Specimen Description Expect. Sput  Final   Special Requests NONE  Final   Sputum evaluation   Final    THIS SPECIMEN IS ACCEPTABLE. RESPIRATORY CULTURE REPORT TO FOLLOW.   Report Status 05/05/2014 FINAL  Final  Culture, respiratory (NON-Expectorated)     Status: None (Preliminary result)   Collection Time: 05/05/14  2:39 AM  Result Value Ref Range Status   Specimen Description SPUTUM  Final   Special Requests NONE  Final   Gram  Stain   Final    NO WBC SEEN RARE SQUAMOUS EPITHELIAL CELLS PRESENT MODERATE GRAM POSITIVE COCCI IN PAIRS IN CLUSTERS FEW GRAM POSITIVE RODS RARE GRAM NEGATIVE RODS    Culture   Final    NORMAL OROPHARYNGEAL FLORA Performed at [facility]    Report Status PENDING  Incomplete     Studies: No results found.  Scheduled Meds: . antiseptic oral rinse  7 mL Mouth Rinse q12n4p  . aspirin EC  81 mg Oral q morning - 10a  . budesonide-formoterol  2 puff Inhalation BID  . chlorhexidine  15 mL Mouth Rinse BID  .  enoxaparin (LOVENOX) injection  40 mg Subcutaneous Daily  . insulin aspart  0-20 Units Subcutaneous TID WC  . insulin aspart  0-5 Units Subcutaneous QHS  . ipratropium-albuterol  3 mL Nebulization Q6H  . levofloxacin (LEVAQUIN) IV  750 mg Intravenous Q24H  . loratadine  10 mg Oral Daily  . multivitamin with minerals  1 tablet Oral Daily  . pantoprazole  40 mg Oral Daily  . [START ON 05/07/2014] predniSONE  40 mg Oral Q breakfast  . roflumilast  500 mcg Oral Daily  . simvastatin  40 mg Oral QPM  . tamsulosin  0.4 mg Oral q morning - 10a   Continuous Infusions:   Principal Problem:   Acute on chronic respiratory failure with hypoxia Active Problems:   Essential hypertension   Tobacco abuse   COPD exacerbation   COPD with acute exacerbation    Time spent: 35 minutes    [place] [facility] Pager [phone]. If 7PM-7AM, please contact night-coverage at [email or web address], password [facility] 05/06/2014, 9:27 AM  LOS: 2 days

## 2014-05-06 NOTE — Progress Notes (Signed)
Physical Therapy Treatment Patient Details Name: [name] [name] MRN: [id number] DOB: 1940-12-26 Today's Date: 05/06/2014    History of Present Illness [name] [name] is a 74 y.o. male with past medical history of chronic respiratory failure secondary to COPD on home oxygen and continues to smoke presents to the emergency department with the chief complaint of worsening shortness of breath. Initial evaluation in the emergency department reveals acute on chronic respiratory failure with hypoxia secondary to COPD exacerbation    PT Comments    Pt reports having had a "rough" night last night due to bad dreams but feels much better this AM.  He appears to be in good spirits and states that he can ambulate with no assistive device.  He was able to ambulate 250' with no assistive device and good gait pattern.  His balance is WNL.  His acute care PT goals have been met and I do not think that he will need any HHPT.  If he wants to pursue advanced strengthening, he could do OP PT.  Follow Up Recommendations  No PT follow up     Equipment Recommendations  None recommended by PT    Recommendations for Other Services  none     Precautions / Restrictions Precautions Precautions: None Restrictions Weight Bearing Restrictions: No    Mobility  Bed Mobility Overal bed mobility: Modified Independent;Independent                Transfers Overall transfer level: Independent Equipment used: None                Ambulation/Gait Ambulation/Gait assistance: Modified independent (Device/Increase time) Ambulation Distance (Feet): 250 Feet Assistive device: None Gait Pattern/deviations: WFL(Within Functional Limits)   Gait velocity interpretation: at or above normal speed for age/gender General Gait Details: O2 at 2 L/min   Stairs            Wheelchair Mobility    Modified Rankin (Stroke Patients Only)       Balance Overall balance assessment: No apparent balance  deficits (not formally assessed)                                  Cognition Arousal/Alertness: Awake/alert Behavior During Therapy: WFL for tasks assessed/performed Overall Cognitive Status: Within Functional Limits for tasks assessed                      Exercises      General Comments        Pertinent Vitals/Pain Pain Assessment: No/denies pain    Home Living                      Prior Function            PT Goals (current goals can now be found in the care plan section) Progress towards PT goals: Goals met/education completed, patient discharged from PT    Frequency       PT Plan Discharge plan needs to be updated    Co-evaluation             End of Session Equipment Utilized During Treatment: Gait belt;Oxygen Activity Tolerance: Patient tolerated treatment well Patient left: in bed;with call bell/phone within reach     Time: 1144-1200 PT Time Calculation (min) (ACUTE ONLY): 16 min  Charges:  $Gait Training: 8-22 mins  G CodesSable Feil 06/02/2014, 12:05 PM

## 2014-05-06 NOTE — Progress Notes (Signed)
ANTIBIOTIC CONSULT NOTE  Pharmacy Consult for Levaquin Indication: COPD exacerbation  Allergies  Allergen Reactions  . Iohexol Hives     Code: HIVES, Desc: PT STATES HE BROKE OUT IN HIVES AND RASH AFTER CT NECK EARLY SEPT 2011; NO RESP PROBLEMS; NEEDS PRE-MEDS; MKS, Onset Date: [id number]   . Ivp Dye [Iodinated Diagnostic Agents] Hives   Patient Measurements: Height: 5' 2.5" (158.8 cm) Weight: 160 lb (72.576 kg) IBW/kg (Calculated) : 55.75  Vital Signs: Temp: 98.9 F (37.2 C) (01/22 0512) Temp Source: Oral (01/22 0512) BP: 137/74 mmHg (01/22 0512) Pulse Rate: 77 (01/22 0512) Intake/Output from previous day: 01/21 0701 - 01/22 0700 In: 600 [P.O.:600] Out: 1200 [Urine:1200] Intake/Output from this shift:    Labs:  Recent Labs  05/04/14 0841 05/05/14 0637  WBC 12.1* 9.2  HGB 13.7 11.8*  PLT 321 303  CREATININE 0.82 0.80   Estimated Creatinine Clearance: 73.8 mL/min (by C-G formula based on Cr of 0.8). No results for input(s): VANCOTROUGH, VANCOPEAK, VANCORANDOM, GENTTROUGH, GENTPEAK, GENTRANDOM, TOBRATROUGH, TOBRAPEAK, TOBRARND, AMIKACINPEAK, AMIKACINTROU, AMIKACIN in the last 72 hours.   Microbiology: Recent Results (from the past 720 hour(s))  Culture, expectorated sputum-assessment     Status: None   Collection Time: 05/05/14  2:39 AM  Result Value Ref Range Status   Specimen Description Expect. Sput  Final   Special Requests NONE  Final   Sputum evaluation   Final    THIS SPECIMEN IS ACCEPTABLE. RESPIRATORY CULTURE REPORT TO FOLLOW.   Report Status 05/05/2014 FINAL  Final  Culture, respiratory (NON-Expectorated)     Status: None (Preliminary result)   Collection Time: 05/05/14  2:39 AM  Result Value Ref Range Status   Specimen Description SPUTUM  Final   Special Requests NONE  Final   Gram Stain   Final    NO WBC SEEN RARE SQUAMOUS EPITHELIAL CELLS PRESENT MODERATE GRAM POSITIVE COCCI IN PAIRS IN CLUSTERS FEW GRAM POSITIVE RODS RARE GRAM NEGATIVE RODS     Culture   Final    NORMAL OROPHARYNGEAL FLORA Performed at [facility]    Report Status PENDING  Incomplete    Medical History: Past Medical History  Diagnosis Date  . COPD (chronic obstructive pulmonary disease)   . Asthma   . Tobacco abuse 09/29/2011  . Hypercholesteremia   . GERD (gastroesophageal reflux disease)   . Headache(784.0)   . BPH (benign prostatic hyperplasia) 10/01/2011    Per cystoscopy  . 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  . Pulmonary nodule 09/2011    Stable appearance  . Chronic back pain   . Chronic anxiety   . Hyperlipidemia 03/13/2012  . Anxiety 03/13/2012  . On home O2     2L N/C continuous  . Emphysema lung    Anti-infectives    Start     Dose/Rate Route Frequency Ordered Stop   05/05/14 1600  levofloxacin (LEVAQUIN) IVPB 750 mg     750 mg100 mL/hr over 90 Minutes Intravenous Every 24 hours 05/04/14 1355     05/04/14 1500  levofloxacin (LEVAQUIN) IVPB 750 mg     750 mg100 mL/hr over 90 Minutes Intravenous  Once 05/04/14 1336 05/04/14 1627      Assessment: 74yo male with h/o COPD and home oxygen.  Pt admitted with exacerbation.  Started on Levaquin.  Clinically improving.  Noted plan to change to PO Levaquin today. Renal function has been stable since admission.   Levaquin 1/20>>  Goal of Therapy:  Eradicate infection.  Plan:  Change Levaquin to 500mg  PO q24hrs Duration of therapy per MD- recommend 5-7 days Pharmacy to sign off.  Please re-consult if needed  Teren Franckowiak, Lavonia Drafts 05/06/2014,10:49 AM

## 2014-05-07 LAB — CULTURE, RESPIRATORY
CULTURE: NORMAL
GRAM STAIN: NONE SEEN

## 2014-05-07 LAB — CULTURE, RESPIRATORY W GRAM STAIN

## 2014-05-07 LAB — GLUCOSE, CAPILLARY
GLUCOSE-CAPILLARY: 149 mg/dL — AB (ref 70–99)
GLUCOSE-CAPILLARY: 150 mg/dL — AB (ref 70–99)
Glucose-Capillary: 104 mg/dL — ABNORMAL HIGH (ref 70–99)
Glucose-Capillary: 111 mg/dL — ABNORMAL HIGH (ref 70–99)

## 2014-05-07 MED ORDER — BISACODYL 5 MG PO TBEC: 5.0000 mg | DELAYED_RELEASE_TABLET | Freq: Every day | ORAL | Status: DC | PRN

## 2014-05-07 NOTE — Progress Notes (Signed)
PROGRESS NOTE  [name] PXT:[id number] DOB: 24-Nov-1940 DOA: 05/04/2014 PCP: [name], PA-C  Summary: 74 year old man with history of chronic hypoxic respiratory failure, COPD on 2.5 L nasal cannula at home presented with shortness of breath, associated fever and cough. He reports he quit smoking 7 weeks ago but recently his girlfriend left him and he started smoking again. After he started smoking he became sick with increasing shortness of breath and cough. He has been treated for acute COPD exacerbation with steroids  Assessment/Plan: 1. Acute on chronic hypoxic respiratory failure. Appears to be at baseline at this point. Secondary to COPD exacerbation, tobacco use. 2. COPD exacerbation. Secondary to tobacco use. Appears to be resolving rapidly. 3. Chronic hypoxic respiratory failure 4. Tobacco dependency 5. Chronic pulmonary nodules with one new pulmonary nodule seen on previous hospitalization. Consider repeat chest CT in 6 months. 6. Illiterate    He appears to be improving rapidly. Oxygenation appears to be stable.   Anticipate home 1/24 on steroid taper and antibiotics.  Code Status: full code DVT prophylaxis: Lovenox Family Communication: none present Disposition Plan: home  [name], MD  [facility]  Pager [phone] If 7PM-7AM, please contact night-coverage at [email or web address], password [facility] 05/07/2014, 12:26 PM  LOS: 3 days   Consultants:  PT: no follow-up needed  Procedures:    Antibiotics:  Levaquin 05/04/14>>>  HPI/Subjective: Breathing better, eating ok, no issues at this point. PT recommended no follow-up.  Objective: Filed Vitals:   05/06/14 2149 05/07/14 0300 05/07/14 0454 05/07/14 0749  BP: 124/67  141/88   Pulse: 89  84   Temp: 97.7 F (36.5 C)  98 F (36.7 C)   TempSrc: Oral  Oral   Resp: 24  21   Height:      Weight:      SpO2: 100% 99% 100% 98%    Intake/Output Summary (Last 24 hours) at 05/07/14 1226 Last data  filed at 05/07/14 1028  Gross per 24 hour  Intake    480 ml  Output    600 ml  Net   -120 ml     Filed Weights   05/04/14 1341  Weight: 72.576 kg (160 lb)    Exam:     Afebrile, VSS, stable hypoxia on 3L General: Appears calm and comfortable, eating lunch Cardiovascular: RRR, no m/r/g. No LE edema. Telemetry: SR, no arrhythmias  Respiratory: CTA bilaterally, no w/r/r. Normal respiratory effort. Abdomen: soft, ntnd Skin: no rash or induration seen on limited exam Musculoskeletal: grossly normal tone BUE/BLE Psychiatric: grossly normal mood and affect, speech fluent and appropriate Neurologic: grossly non-focal.  Data Reviewed:  Capillary blood sugar stable.  Scheduled Meds: . antiseptic oral rinse  7 mL Mouth Rinse q12n4p  . aspirin EC  81 mg Oral q morning - 10a  . budesonide-formoterol  2 puff Inhalation BID  . chlorhexidine  15 mL Mouth Rinse BID  . enoxaparin (LOVENOX) injection  40 mg Subcutaneous Daily  . insulin aspart  0-20 Units Subcutaneous TID WC  . insulin aspart  0-5 Units Subcutaneous QHS  . ipratropium-albuterol  3 mL Nebulization Q6H  . levofloxacin  500 mg Oral Daily  . loratadine  10 mg Oral Daily  . multivitamin with minerals  1 tablet Oral Daily  . pantoprazole  40 mg Oral Daily  . predniSONE  40 mg Oral Q breakfast  . roflumilast  500 mcg Oral Daily  . simvastatin  40 mg Oral QPM  . tamsulosin  0.4 mg Oral  q morning - 10a   Continuous Infusions:   Principal Problem:   Acute on chronic respiratory failure with hypoxia Active Problems:   Essential hypertension   Tobacco abuse   COPD exacerbation   COPD with acute exacerbation   Chronic respiratory failure with hypoxia   Time spent 15 minutes

## 2014-05-08 LAB — GLUCOSE, CAPILLARY
GLUCOSE-CAPILLARY: 121 mg/dL — AB (ref 70–99)
Glucose-Capillary: 122 mg/dL — ABNORMAL HIGH (ref 70–99)

## 2014-05-08 MED ORDER — SALINE SPRAY 0.65 % NA SOLN: 1.0000 | NASAL | Status: DC | PRN

## 2014-05-08 MED ORDER — PREDNISONE 10 MG PO TABS: ORAL_TABLET | ORAL | Status: DC

## 2014-05-08 NOTE — Discharge Summary (Addendum)
Physician Discharge Summary  [name] [name] PZW:[id number] DOB: [birth year] DOA: 05/04/2014  PCP: [name], PA-C  Admit date: 05/04/2014 Discharge date: 05/08/2014  Recommendations for Outpatient Follow-up:  1. Resolution of COPD exacerbation 2. Continue to encourage smoking abstinence 3. Chronic pulmonary nodules, consider repeat chest CT July 2016. 4. Resume home health, RN, PT, OT, Education officer, museum, nurse. Patient refused assisted living.   Follow-up Information    Follow up with [facility].   Contact information:   [address] [place] [address] [phone]       Follow up with [name], PA-C. Schedule an appointment as soon as possible for a visit in 1 week.   Specialty:  General Practice   Contact information:   [address] [place] [address] [phone]      Discharge Diagnoses:  1. COPD exacerbation 2. Acute on chronic hypoxic respiratory failure 3. Chronic hypoxic respiratory failure 4. Tobacco dependence  Discharge Condition: improved Disposition: home  Diet recommendation: regular  Filed Weights   05/04/14 1341  Weight: 72.576 kg (160 lb)    History of present illness:  74 year old man with history of COPD, chronic hypoxic respiratory failure on 2.5 L nasal cannula at home presented with shortness of breath, fever and cough. Admitted for treatment of COPD exacerbation. Of note recently started smoking again.  Hospital Course:  Mr. Widmayer was admitted, treated with empiric steroids, bronchodilators and oxygen supplementation as well as a short course of Levaquin. He reports his shortness of breath is at baseline, he is ambulating well. He is ready for discharge. He does have a history of significant anxiety. He denies suicidal ideation. "I love this old boy too much". His hospitalization was uncomplicated and he is now stable for discharge. Discussed with son at bedside. Individual issues as  below.   1. COPD exacerbation. Secondary to tobacco use. Appears resolved. 2. Acute on chronic hypoxic respiratory failure. Acute component resolved. Remains at baseline at this point. Secondary to COPD exacerbation, tobacco use. 3. Chronic hypoxic respiratory failure, stable 4. Tobacco dependence, recommend cessation 5. Chronic pulmonary nodules with one new pulmonary nodule seen on previous hospitalization. Consider repeat chest CT in 6 months.  Consultants:  PT: no follow-up needed  Procedures:    Antibiotics:  Levaquin 1/20 >> 1/24  Discharge Instructions  Discharge Instructions    Activity as tolerated - No restrictions    Complete by:  As directed      Diet general    Complete by:  As directed      Discharge instructions    Complete by:  As directed   Call your physician or seek immediate medical attention for shortness of breath, wheezing or worsening of condition.          Current Discharge Medication List    START taking these medications   Details  sodium chloride (OCEAN) 0.65 % SOLN nasal spray Place 1 spray into both nostrils as needed for congestion. Qty: 1 Bottle, Refills: 0      CONTINUE these medications which have CHANGED   Details  predniSONE (DELTASONE) 10 MG tablet Take 40 mg by mouth daily for 3 days, then take 20 mg by mouth daily for 3 days, then take 10 mg by mouth daily for 3 days, then stop. Qty: 21 tablet, Refills: 0      CONTINUE these medications which have NOT CHANGED   Details  albuterol (PROVENTIL) (2.5 MG/3ML) 0.083% nebulizer solution Take 2.5 mg by  nebulization every 4 (four) hours as needed. For shortness of breath    albuterol-ipratropium (COMBIVENT) 18-103 MCG/ACT inhaler Inhale 2 puffs into the lungs 4 (four) times daily. Qty: 1 Inhaler, Refills: 12    ALPRAZolam (XANAX) 1 MG tablet Take 0.5-1 mg by mouth 2 (two) times daily as needed for anxiety. anxiety    aspirin EC 81 MG  tablet Take 81 mg by mouth every morning.     budesonide-formoterol (SYMBICORT) 160-4.5 MCG/ACT inhaler Inhale 2 puffs into the lungs 2 (two) times daily. Qty: 1 Inhaler, Refills: 12    Cholecalciferol (VITAMIN D3) 1000 UNITS CAPS Take 1 capsule by mouth every morning.     Cyanocobalamin (B-12) 2500 MCG TABS Take 1 tablet by mouth daily.    DALIRESP 500 MCG TABS tablet Take 500 mcg by mouth daily.    esomeprazole (NEXIUM) 40 MG capsule Take 40 mg by mouth every morning.    Garlic 2130 MG CAPS Take 1 capsule by mouth every morning.     HYDROcodone-acetaminophen (NORCO) 10-325 MG per tablet Take 1 tablet by mouth every 6 (six) hours as needed. For pain    loratadine (CLARITIN) 10 MG tablet Take 10 mg by mouth daily.    Multiple Vitamin (MULTIVITAMIN WITH MINERALS) TABS tablet Take 1 tablet by mouth daily.    NITROSTAT 0.4 MG SL tablet Place 1 tablet under the tongue as needed for chest pain.     polyethylene glycol powder (GLYCOLAX/MIRALAX) powder Take 17 g by mouth daily as needed for mild constipation or moderate constipation.     simvastatin (ZOCOR) 40 MG tablet Take 40 mg by mouth every morning.     tamsulosin (FLOMAX) 0.4 MG CAPS capsule Take 0.4 mg by mouth every morning.       STOP taking these medications     azithromycin (ZITHROMAX) 250 MG tablet      cefUROXime (CEFTIN) 500 MG tablet        Allergies  Allergen Reactions  . Iohexol Hives     Code: HIVES, Desc: PT STATES HE BROKE OUT IN HIVES AND RASH AFTER CT NECK EARLY SEPT 2011; NO RESP PROBLEMS; NEEDS PRE-MEDS; MKS, Onset Date: [id number]   . Ivp Dye [Iodinated Diagnostic Agents] Hives    The results of significant diagnostics from this hospitalization (including imaging, microbiology, ancillary and laboratory) are listed below for reference.    Significant Diagnostic Studies: Dg Chest Portable 1 View  05/04/2014   CLINICAL DATA:  74 year old male with acute shortness of Breath. Current history of COPD,  smoker. Initial encounter.  EXAM: PORTABLE CHEST - 1 VIEW  COMPARISON:  02/24/2014 and earlier.  FINDINGS: Portable AP upright view at 0831 hrs. Stable lung volumes. Stable cardiac size and mediastinal contours. No pneumothorax, pleural effusion or consolidation. Stable chronically increased coarse interstitial markings. No acute pulmonary opacity.  IMPRESSION: Stable chronic lung disease.  No acute cardiopulmonary abnormality.   Electronically Signed   By: Lars Pinks M.D.   On: 05/04/2014 08:41    Microbiology: Recent Results (from the past 240 hour(s))  Culture, expectorated sputum-assessment     Status: None   Collection Time: 05/05/14  2:39 AM  Result Value Ref Range Status   Specimen Description Expect. Sput  Final   Special Requests NONE  Final   Sputum evaluation   Final    THIS SPECIMEN IS ACCEPTABLE. RESPIRATORY CULTURE REPORT TO FOLLOW.   Report Status 05/05/2014 FINAL  Final  Culture, respiratory (NON-Expectorated)     Status: None  Collection Time: 05/05/14  2:39 AM  Result Value Ref Range Status   Specimen Description SPUTUM  Final   Special Requests NONE  Final   Gram Stain   Final    NO WBC SEEN RARE SQUAMOUS EPITHELIAL CELLS PRESENT MODERATE GRAM POSITIVE COCCI IN PAIRS IN CLUSTERS FEW GRAM POSITIVE RODS RARE GRAM NEGATIVE RODS    Culture   Final    NORMAL OROPHARYNGEAL FLORA Performed at [facility]    Report Status 05/07/2014 FINAL  Final     Labs: Basic Metabolic Panel:  Recent Labs Lab 05/04/14 0841 05/05/14 0637  NA 130* 134*  K 3.3* 3.8  CL 94* 98  CO2 27 27  GLUCOSE 125* 177*  BUN 8 9  CREATININE 0.82 0.80  CALCIUM 9.5 9.8  MG 1.9  --    CBC:  Recent Labs Lab 05/04/14 0841 05/05/14 0637  WBC 12.1* 9.2  NEUTROABS 6.8  --   HGB 13.7 11.8*  HCT 40.2 35.1*  MCV 91.2 91.6  PLT 321 303   CBG:  Recent Labs Lab 05/07/14 1154 05/07/14 1645 05/07/14 2039 05/08/14 0825 05/08/14 1233  GLUCAP 111* 149* 150* 121* 122*     Principal Problem:   Acute on chronic respiratory failure with hypoxia Active Problems:   Essential hypertension   Tobacco abuse   COPD exacerbation   COPD with acute exacerbation   Chronic respiratory failure with hypoxia   Time coordinating discharge: 35 minutes  Signed:  [name], MD [facility] 05/08/2014, 1:26 PM

## 2014-05-08 NOTE — Progress Notes (Signed)
PROGRESS NOTE  [name] [name]:[id number] DOB: 11-Sep-1940 DOA: 05/04/2014 PCP: [name], PA-C  Summary: 74 year old man with history of chronic hypoxic respiratory failure, COPD on 2.5 L nasal cannula at home presented with shortness of breath, associated fever and cough. He reports he quit smoking 7 weeks ago but recently his girlfriend left him and he started smoking again. After he started smoking he became sick with increasing shortness of breath and cough. He has been treated for acute COPD exacerbation with steroids  Assessment/Plan:                                               1. COPD exacerbation. Secondary to tobacco use. Appears resolved. 2. Acute on chronic hypoxic respiratory failure. Acute component resolved. Remains at baseline at this point. Secondary to COPD exacerbation, tobacco use. 3. Chronic hypoxic respiratory failure, stable 4. Tobacco dependence, recommend cessation 5. Chronic pulmonary nodules with one new pulmonary nodule seen on previous hospitalization. Consider repeat chest CT in 6 months.   Appears to be at baseline, anxious but feels good.  Finishes abx today. Home on steroid taper.  Home with home health RN, PT, OT, social worker, nurses. Patient refused assisted living.  Discussed with son at bedside.  [name], MD  [facility]  Pager [phone] If 7PM-7AM, please contact night-coverage at [email or web address], password [facility] 05/08/2014, 12:42 PM  LOS: 4 days   Consultants:  PT: no follow-up needed  Procedures:    Antibiotics:  Levaquin 1/20 >> 1/24  HPI/Subjective: RN documentation noted, patient anxious.  Patient feels better, ambulating well to bathroom, breathing better, eating. Feeling well, just has some nasal congestion.   Objective: Filed Vitals:   05/07/14 1956 05/07/14 2050 05/08/14 0130 05/08/14 0539  BP:  107/50  126/80  Pulse:  115  68  Temp:  97.8 F (36.6 C)  98.4 F (36.9 C)  TempSrc:  Oral  Oral    Resp:    20  Height:      Weight:      SpO2: 94% 97% 98% 100%   No intake or output data in the 24 hours ending 05/08/14 1242   Filed Weights   05/04/14 1341  Weight: 72.576 kg (160 lb)    Exam:     Afebrile, vital signs stable, stable hypoxia on 3L General:  Appears comfortable, calm. Cardiovascular: Regular rate and rhythm, no murmur, rub or gallop.  Respiratory: fair air movement, few wheezes, breathing at baseline. Mild increased respiratory effort stable. Psychiatric: grossly normal mood and affect, speech fluent and appropriate  Data Reviewed:  Capillary blood sugars stable.  Sputum culture normal oropharyngeal flora  Scheduled Meds: . antiseptic oral rinse  7 mL Mouth Rinse q12n4p  . aspirin EC  81 mg Oral q morning - 10a  . budesonide-formoterol  2 puff Inhalation BID  . chlorhexidine  15 mL Mouth Rinse BID  . enoxaparin (LOVENOX) injection  40 mg Subcutaneous Daily  . insulin aspart  0-20 Units Subcutaneous TID WC  . insulin aspart  0-5 Units Subcutaneous QHS  . ipratropium-albuterol  3 mL Nebulization Q6H  . levofloxacin  500 mg Oral Daily  . loratadine  10 mg Oral Daily  . multivitamin with minerals  1 tablet Oral Daily  . pantoprazole  40 mg Oral Daily  . predniSONE  40 mg Oral Q breakfast  .  roflumilast  500 mcg Oral Daily  . simvastatin  40 mg Oral QPM  . tamsulosin  0.4 mg Oral q morning - 10a   Continuous Infusions:   Principal Problem:   Acute on chronic respiratory failure with hypoxia Active Problems:   Essential hypertension   Tobacco abuse   COPD exacerbation   COPD with acute exacerbation   Chronic respiratory failure with hypoxia

## 2014-05-08 NOTE — Progress Notes (Signed)
Patient very anxious about impending discharge.  Patient states he is not ready/comfortable with discharge today.

## 2014-05-08 NOTE — Progress Notes (Signed)
Home health orders and info has been faxed to [facility].

## 2014-05-08 NOTE — Progress Notes (Signed)
Patient in stable condition at discharge.  Patient received discharge instructions and scripts and patient had no further questions or concerns.  Oxygen was brought in from home and patient was escorted to main entrance via wheelchair by nurse tech with 2L of oxygen.

## 2014-07-15 ENCOUNTER — Telehealth: Payer: Self-pay

## 2014-07-15 NOTE — Telephone Encounter (Signed)
07/15/14 Disc Received from unknown location and filed on shelf.[name] [name]

## 2015-04-27 ENCOUNTER — Telehealth: Payer: Self-pay | Admitting: Family Medicine

## 2015-04-27 NOTE — Telephone Encounter (Signed)
Question answered. 

## 2015-05-01 ENCOUNTER — Ambulatory Visit (INDEPENDENT_AMBULATORY_CARE_PROVIDER_SITE_OTHER): Payer: Commercial Managed Care - HMO | Admitting: Family Medicine

## 2015-05-01 ENCOUNTER — Encounter: Payer: Self-pay | Admitting: Family Medicine

## 2015-05-01 VITALS — BP 103/61 | HR 104 | Temp 97.1°F | Ht 62.5 in | Wt 166.8 lb

## 2015-05-01 DIAGNOSIS — M21611 Bunion of right foot: Secondary | ICD-10-CM

## 2015-05-01 DIAGNOSIS — G894 Chronic pain syndrome: Secondary | ICD-10-CM | POA: Insufficient documentation

## 2015-05-01 DIAGNOSIS — H9190 Unspecified hearing loss, unspecified ear: Secondary | ICD-10-CM

## 2015-05-01 DIAGNOSIS — H269 Unspecified cataract: Secondary | ICD-10-CM

## 2015-05-01 DIAGNOSIS — M21612 Bunion of left foot: Secondary | ICD-10-CM

## 2015-05-01 DIAGNOSIS — R319 Hematuria, unspecified: Secondary | ICD-10-CM

## 2015-05-01 DIAGNOSIS — R21 Rash and other nonspecific skin eruption: Secondary | ICD-10-CM

## 2015-05-01 MED ORDER — TRIAMCINOLONE ACETONIDE 0.1 % EX CREA: 1.0000 "application " | TOPICAL_CREAM | Freq: Two times a day (BID) | CUTANEOUS | Status: DC

## 2015-05-01 MED ORDER — DULOXETINE HCL 20 MG PO CPEP: 20.0000 mg | ORAL_CAPSULE | Freq: Every day | ORAL | Status: DC

## 2015-05-01 NOTE — Progress Notes (Signed)
HPI  Patient presents today here to establish care.  Patient states that he needs several referrals today. He has anxiety and uses 1 mg of Xanax 3 times a day, on occasion 4 times a day He states he's been on this for 2+ years.  Chronic pain He describes chronic leg pain which he uses hydrocodone 4. He tells me that he has used all of his hydrocodone prescribed last month in about 2 weeks. On review of the [place] trouble substance database he was given #150,  10 mg pills He states that he's been out for 2 weeks. 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 Requests podiatry referral.  Rash Itchy rash on his chest His previous provider prescribed him ketoconazole shampoo which did not help. He states that he used hydrocortisone and had some improvement.  COPD Uses oxygen chronically On Symbicort, Roflumiast, and Combivent as needed. Good compliance Breathing at baseline   Reports 3 days of hematuria- Has Hx of bladder cancer  PMH: Smoking status noted His past medical, surgical, social, family history reviewed and updated in EMR ROS: Per HPI  Objective: BP 103/61 mmHg  Pulse 104  Temp(Src) 97.1 F (36.2 C) (Oral)  Ht 5' 2.5" (1.588 m)  Wt 166 lb 12.8 oz (75.66 kg)  BMI 30.00 kg/m2 Gen: NAD, alert, cooperative with exam HEENT: NCAT, oropharynx clear, nasal cannula in place CV: RRR, good S1/S2, no murmur Resp: Nonlabored, expiratory wheezes throughout Abd: SNTND, BS present, no guarding or organomegaly Ext: No edema, warm Neuro: Alert and oriented, walks with a cane  Skin: Slightly erythematous hyperkeratotic rash with yellow scaling scattered across his chest, no areas of fluctuance or drainage, no areas of tenderness   He left without leaving a urine sample, He was reminded by nursing but left regardless after he (the patient) requested UA. He drank water to help him urinate in the room.   Assessment and plan:  #  Anxiety Denies any depression, also denies suicidal ideation He is on Xanax alone Starting Cymbalta 20 mg considering his age, in hopes that we will get anxiety and pain benefit. According to the [facility] controlled substance database he has 2 refills left on Xanax as of January 3. He uses #90, 1 mg pills a month  # Chronic pain syndrome, narcotic misuse He has difficulty describing any additional pain rather than the pain related to his bunions so I have referred him to podiatry. I explained to him that he is misusing hydrocodone and so I will not prescribe it, also I generally recommend seeing a specialist if there is concomitant benzodiazepine use He states that he's been out for 2 weeks not worried about withdrawal symptoms. According to her throat with a controlled substance database his last refill was December 21, he describes that he's been out for at least 2 weeks to me.  i refused his narcotics as I feel he is mis-using them. Also I do not prescribe narcotics and benzodiazepines together without specialist involvement  # COPD Severe disease Continue current meds and chronic oxygen Symptoms at baseline  # Rash Unclear etiology, however he did have improvement with steroid cream Triamcinolone 0.1% cream     Orders Placed This Encounter  Procedures  . Ambulatory referral to Audiology    Referral Priority:  Routine    Referral Type:  Audiology Exam    Referral Reason:  Specialty Services Required    Number of Visits Requested:  1  .  Ambulatory referral to Ophthalmology    Referral Priority:  Routine    Referral Type:  Consultation    Referral Reason:  Specialty Services Required    Requested Specialty:  Ophthalmology    Number of Visits Requested:  1  . Ambulatory referral to Pain Clinic    Referral Priority:  Routine    Referral Type:  Consultation    Referral Reason:  Specialty Services Required    Requested Specialty:  Pain Medicine    Number of Visits  Requested:  1  . Ambulatory referral to Podiatry    Referral Priority:  Routine    Referral Type:  Consultation    Referral Reason:  Specialty Services Required    Requested Specialty:  Podiatry    Number of Visits Requested:  1  . POCT urinalysis dipstick  . POCT UA - Microscopic Only    Meds ordered this encounter  Medications  . ketoconazole (NIZORAL) 2 % cream    Sig: Apply 1 application topically daily.  Marland Kitchen DISCONTD: triamcinolone cream (KENALOG) 0.1 %    Sig: Apply 1 application topically 2 (two) times daily.    Dispense:  30 g    Refill:  0  . DULoxetine (CYMBALTA) 20 MG capsule    Sig: Take 1 capsule (20 mg total) by mouth daily.    Dispense:  30 capsule    Refill:  3  . triamcinolone cream (KENALOG) 0.1 %    Sig: Apply 1 application topically 2 (two) times daily.    Dispense:  30 g    Refill:  [place], MD [facility] 05/01/2015, 5:26 PM

## 2015-05-01 NOTE — Patient Instructions (Addendum)
Great to meet you!  Come back in 3-4 weeks to discuss anxiety

## 2015-05-07 ENCOUNTER — Emergency Department (HOSPITAL_COMMUNITY): Payer: Commercial Managed Care - HMO

## 2015-05-07 ENCOUNTER — Encounter (HOSPITAL_COMMUNITY): Payer: Self-pay | Admitting: Cardiology

## 2015-05-07 ENCOUNTER — Emergency Department (HOSPITAL_COMMUNITY)
Admission: EM | Admit: 2015-05-07 | Discharge: 2015-05-07 | Disposition: A | Discharge status: Home / Self Care | Payer: Commercial Managed Care - HMO | Attending: Emergency Medicine | Admitting: Emergency Medicine

## 2015-05-07 DIAGNOSIS — J441 Chronic obstructive pulmonary disease with (acute) exacerbation: Secondary | ICD-10-CM | POA: Diagnosis not present

## 2015-05-07 DIAGNOSIS — Z9889 Other specified postprocedural states: Secondary | ICD-10-CM | POA: Diagnosis not present

## 2015-05-07 DIAGNOSIS — F17210 Nicotine dependence, cigarettes, uncomplicated: Secondary | ICD-10-CM | POA: Diagnosis not present

## 2015-05-07 DIAGNOSIS — E785 Hyperlipidemia, unspecified: Secondary | ICD-10-CM | POA: Insufficient documentation

## 2015-05-07 DIAGNOSIS — Z86018 Personal history of other benign neoplasm: Secondary | ICD-10-CM | POA: Insufficient documentation

## 2015-05-07 DIAGNOSIS — Z8551 Personal history of malignant neoplasm of bladder: Secondary | ICD-10-CM | POA: Diagnosis not present

## 2015-05-07 DIAGNOSIS — Z79899 Other long term (current) drug therapy: Secondary | ICD-10-CM | POA: Insufficient documentation

## 2015-05-07 DIAGNOSIS — Z7952 Long term (current) use of systemic steroids: Secondary | ICD-10-CM | POA: Diagnosis not present

## 2015-05-07 DIAGNOSIS — R319 Hematuria, unspecified: Secondary | ICD-10-CM

## 2015-05-07 DIAGNOSIS — Z9981 Dependence on supplemental oxygen: Secondary | ICD-10-CM | POA: Diagnosis not present

## 2015-05-07 DIAGNOSIS — K219 Gastro-esophageal reflux disease without esophagitis: Secondary | ICD-10-CM | POA: Insufficient documentation

## 2015-05-07 DIAGNOSIS — E7800 Pure hypercholesterolemia, unspecified: Secondary | ICD-10-CM | POA: Diagnosis not present

## 2015-05-07 DIAGNOSIS — F329 Major depressive disorder, single episode, unspecified: Secondary | ICD-10-CM | POA: Insufficient documentation

## 2015-05-07 DIAGNOSIS — F419 Anxiety disorder, unspecified: Secondary | ICD-10-CM | POA: Insufficient documentation

## 2015-05-07 DIAGNOSIS — G8929 Other chronic pain: Secondary | ICD-10-CM | POA: Insufficient documentation

## 2015-05-07 DIAGNOSIS — N3289 Other specified disorders of bladder: Secondary | ICD-10-CM | POA: Insufficient documentation

## 2015-05-07 DIAGNOSIS — Z7982 Long term (current) use of aspirin: Secondary | ICD-10-CM | POA: Insufficient documentation

## 2015-05-07 DIAGNOSIS — Z7951 Long term (current) use of inhaled steroids: Secondary | ICD-10-CM | POA: Diagnosis not present

## 2015-05-07 LAB — CBC WITH DIFFERENTIAL/PLATELET
BASOS PCT: 0 %
Basophils Absolute: 0.1 10*3/uL (ref 0.0–0.1)
EOS ABS: 0.4 10*3/uL (ref 0.0–0.7)
EOS PCT: 4 %
HCT: 38.7 % — ABNORMAL LOW (ref 39.0–52.0)
HEMOGLOBIN: 12.7 g/dL — AB (ref 13.0–17.0)
LYMPHS ABS: 4.1 10*3/uL — AB (ref 0.7–4.0)
Lymphocytes Relative: 34 %
MCH: 30.8 pg (ref 26.0–34.0)
MCHC: 32.8 g/dL (ref 30.0–36.0)
MCV: 93.7 fL (ref 78.0–100.0)
MONOS PCT: 11 %
Monocytes Absolute: 1.3 10*3/uL — ABNORMAL HIGH (ref 0.1–1.0)
NEUTROS PCT: 51 %
Neutro Abs: 6.2 10*3/uL (ref 1.7–7.7)
PLATELETS: 266 10*3/uL (ref 150–400)
RBC: 4.13 MIL/uL — ABNORMAL LOW (ref 4.22–5.81)
RDW: 14.2 % (ref 11.5–15.5)
WBC: 12.1 10*3/uL — ABNORMAL HIGH (ref 4.0–10.5)

## 2015-05-07 LAB — URINE MICROSCOPIC-ADD ON: BACTERIA UA: NONE SEEN

## 2015-05-07 LAB — PROTIME-INR
INR: 0.99 (ref 0.00–1.49)
Prothrombin Time: 13.3 seconds (ref 11.6–15.2)

## 2015-05-07 LAB — BASIC METABOLIC PANEL
ANION GAP: 5 (ref 5–15)
BUN: 12 mg/dL (ref 6–20)
CALCIUM: 9.7 mg/dL (ref 8.9–10.3)
CO2: 34 mmol/L — AB (ref 22–32)
CREATININE: 0.7 mg/dL (ref 0.61–1.24)
Chloride: 98 mmol/L — ABNORMAL LOW (ref 101–111)
GFR calc Af Amer: >60 mL/min (ref >60)
GLUCOSE: 88 mg/dL (ref 65–99)
Potassium: 4.1 mmol/L (ref 3.5–5.1)
Sodium: 137 mmol/L (ref 135–145)

## 2015-05-07 LAB — URINALYSIS, ROUTINE W REFLEX MICROSCOPIC
Glucose, UA: NEGATIVE mg/dL
KETONES UR: NEGATIVE mg/dL
NITRITE: NEGATIVE
PH: 5.5 (ref 5.0–8.0)
Protein, ur: 30 mg/dL — AB
SPECIFIC GRAVITY, URINE: 1.01 (ref 1.005–1.030)

## 2015-05-07 MED ORDER — IPRATROPIUM-ALBUTEROL 0.5-2.5 (3) MG/3ML IN SOLN
3.0000 mL | Freq: Once | RESPIRATORY_TRACT | Status: AC
  Administered 2015-05-07: 3 mL via RESPIRATORY_TRACT
  Filled 2015-05-07: qty 3

## 2015-05-07 NOTE — Discharge Instructions (Signed)
Hematuria, Adult As we discussed it looks like your bladder tumor has returned. Follow-up with the urologist either in [place] or [place]. Return to the ED if he develop worsening bleeding, pain, shortness of breath or any other concerns. Hematuria is blood in your urine. It can be caused by a bladder infection, kidney infection, prostate infection, kidney stone, or cancer of your urinary tract. Infections can usually be treated with medicine, and a kidney stone usually will pass through your urine. If neither of these is the cause of your hematuria, further workup to find out the reason may be needed. It is very important that you tell your health care provider about any blood you see in your urine, even if the blood stops without treatment or happens without causing pain. Blood in your urine that happens and then stops and then happens again can be a symptom of a very serious condition. Also, pain is not a symptom in the initial stages of many urinary cancers. HOME CARE INSTRUCTIONS   Drink lots of fluid, 3-4 quarts a day. If you have been diagnosed with an infection, cranberry juice is especially recommended, in addition to large amounts of water.  Avoid caffeine, tea, and carbonated beverages because they tend to irritate the bladder.  Avoid alcohol because it may irritate the prostate.  Take all medicines as directed by your health care provider.  If you were prescribed an antibiotic medicine, finish it all even if you start to feel better.  If you have been diagnosed with a kidney stone, follow your health care provider's instructions regarding straining your urine to catch the stone.  Empty your bladder often. Avoid holding urine for long periods of time.  After a bowel movement, women should cleanse front to back. Use each tissue only once.  Empty your bladder before and after sexual intercourse if you are a male. SEEK MEDICAL CARE IF:  You develop back pain.  You have a  fever.  You have a feeling of sickness in your stomach (nausea) or vomiting.  Your symptoms are not better in 3 days. Return sooner if you are getting worse. SEEK IMMEDIATE MEDICAL CARE IF:   You develop severe vomiting and are unable to keep the medicine down.  You develop severe back or abdominal pain despite taking your medicines.  You begin passing a large amount of blood or clots in your urine.  You feel extremely weak or faint, or you pass out. MAKE SURE YOU:   Understand these instructions.  Will watch your condition.  Will get help right away if you are not doing well or get worse.   This information is not intended to replace advice given to you by your health care provider. Make sure you discuss any questions you have with your health care provider.   Document Released: 04/01/2005 Document Revised: 04/22/2014 Document Reviewed: 11/30/2012 Elsevier Interactive Patient Education [employer].

## 2015-05-07 NOTE — ED Notes (Addendum)
Blood in urine times one week.  C/o lower abdominal pain.  Per daugher pt fell one week ago.  Also had tumor removed from bladder in July and recently had some of his medicines changed.

## 2015-05-07 NOTE — ED Provider Notes (Signed)
CSN: [id number]     Arrival date & time 05/07/15  1157 History  By signing my name below, I, Jolayne Panther, attest that this documentation has been prepared under the direction and in the presence of Ezequiel Essex, MD. Electronically Signed: Jolayne Panther, Scribe. 05/07/2015. 12:55 PM.   Chief Complaint  Patient presents with  . Hematuria   The history is provided by the patient. No language interpreter was used.   HPI Comments: [name] [name] is a 75 y.o. male with a hx of COPD who presents to the Emergency Department complaining of onset of hematuria three days ago. He also notes that he has experienecd similar symptoms in the past and reports an incident one week ago where he fell and hit the right side of his back on a table. Pt denies associated cough, dysuria, fever, abdominal pain, back pain, chest pain, SOB, testicular pain, and vomiting. Pt takes aspirin daily. He denies taking anticoagulants. Pt is on 4L/min O2 at home.   Past Medical History  Diagnosis Date  . COPD (chronic obstructive pulmonary disease) ([place])   . Asthma   . Tobacco abuse 09/29/2011  . Hypercholesteremia   . GERD (gastroesophageal reflux disease)   . Headache(784.0)   . BPH (benign prostatic hyperplasia) 10/01/2011    Per cystoscopy  . 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  . Pulmonary nodule 09/2011    Stable appearance  . Chronic back pain   . Chronic anxiety   . Hyperlipidemia 03/13/2012  . Anxiety 03/13/2012  . On home O2     2L N/C continuous  . Emphysema lung ([place])   . Depression   . History of stomach ulcers    Past Surgical History  Procedure Laterality Date  . Cystoscopy  09/30/2011    Procedure: CYSTOSCOPY FLEXIBLE;  Surgeon: Marissa Nestle, MD;  Location: [facility];  Service: Urology;  Laterality: N/A;  . Transurethral resection of bladder tumor  10/01/2011    Procedure: TRANSURETHRAL RESECTION OF BLADDER TUMOR (TURBT);  Surgeon: Marissa Nestle, MD;   Location: [facility];  Service: Urology;  Laterality: N/A;  . Cardiac catheterization  2003    [facility]  . Fracture surgery      R ring finger, pin placed   Family History  Problem Relation Age of Onset  . Emphysema Brother     was not close to family; does not truly know their medical problems.  . Emphysema Brother   . Emphysema Brother   . Cancer Neg Hx    Social History  Substance Use Topics  . Smoking status: Current Every Day Smoker -- 1.50 packs/day for 58 years    Types: Cigarettes    Last Attempt to Quit: 02/17/2014  . Smokeless tobacco: None  . Alcohol Use: No    Review of Systems  A complete 10 system review of systems was obtained and all systems are negative except as noted in the HPI and PMH.   Allergies  Iohexol and Ivp dye  Home Medications   Prior to Admission medications   Medication Sig Start Date End Date Taking? Authorizing Provider  albuterol (PROVENTIL) (2.5 MG/3ML) 0.083% nebulizer solution Take 2.5 mg by nebulization every 4 (four) hours as needed. For shortness of breath   Yes Historical Provider, MD  albuterol-ipratropium (COMBIVENT) 18-103 MCG/ACT inhaler Inhale 2 puffs into the lungs 4 (four) times daily. 03/30/13  Yes Rexene Alberts, MD  ALPRAZolam Duanne Moron) 1 MG tablet Take 0.5-1 mg by  mouth 2 (two) times daily as needed for anxiety. anxiety 02/20/13  Yes Domenic Polite, MD  aspirin EC 81 MG tablet Take 81 mg by mouth every morning.  10/02/11  Yes Rexene Alberts, MD  budesonide-formoterol [facility]) 160-4.5 MCG/ACT inhaler Inhale 2 puffs into the lungs 2 (two) times daily. 03/30/13  Yes Rexene Alberts, MD  Cholecalciferol (VITAMIN D3) 1000 UNITS CAPS Take 1 capsule by mouth every morning.    Yes Historical Provider, MD  citalopram (CELEXA) 20 MG tablet Take 20 mg by mouth daily.  04/18/15  Yes Historical Provider, MD  Cyanocobalamin (B-12) 2500 MCG TABS Take 1 tablet by mouth daily.   Yes Historical Provider, MD  DALIRESP 500 MCG TABS tablet Take 500 mcg by  mouth daily. 01/26/14  Yes Historical Provider, MD  docusate sodium (COLACE) 100 MG capsule Take 100 mg by mouth 2 (two) times daily.   Yes Historical Provider, MD  DULoxetine (CYMBALTA) 20 MG capsule Take 1 capsule (20 mg total) by mouth daily. 05/01/15  Yes Timmothy Euler, MD  esomeprazole (NEXIUM) 40 MG capsule Take 40 mg by mouth every morning.   Yes Historical Provider, MD  Garlic 123XX123 MG CAPS Take 1 capsule by mouth every morning.    Yes Historical Provider, MD  ketoconazole (NIZORAL) 2 % cream Apply 1 application topically daily.   Yes Historical Provider, MD  loratadine (CLARITIN) 10 MG tablet Take 10 mg by mouth daily.   Yes Historical Provider, MD  Multiple Vitamin (MULTIVITAMIN WITH MINERALS) TABS tablet Take 1 tablet by mouth daily.   Yes Historical Provider, MD  NITROSTAT 0.4 MG SL tablet Place 1 tablet under the tongue as needed for chest pain.  02/22/13  Yes Historical Provider, MD  simvastatin (ZOCOR) 40 MG tablet Take 40 mg by mouth every morning.    Yes Historical Provider, MD  sodium chloride (OCEAN) 0.65 % SOLN nasal spray Place 1 spray into both nostrils as needed for congestion. 05/08/14  Yes Samuella Cota, MD  triamcinolone cream (KENALOG) 0.1 % Apply 1 application topically 2 (two) times daily. 05/01/15  Yes Timmothy Euler, MD   BP 142/97 mmHg  Pulse 78  Temp(Src) 98.1 F (36.7 C) (Oral)  Resp 14  Ht 5\' 5"  (1.651 m)  Wt 166 lb (75.297 kg)  BMI 27.62 kg/m2  SpO2 99% Physical Exam  Constitutional: He is oriented to person, place, and time. He appears well-developed and well-nourished. No distress.  HENT:  Head: Normocephalic and atraumatic.  Mouth/Throat: Oropharynx is clear and moist. No oropharyngeal exudate.  Eyes: Conjunctivae and EOM are normal. Pupils are equal, round, and reactive to light.  Neck: Normal range of motion. Neck supple.  No meningismus.  Cardiovascular: Normal rate, regular rhythm, normal heart sounds and intact distal pulses.   No  murmur heard. Pulmonary/Chest: Effort normal and breath sounds normal. No respiratory distress.  Scattered exploratory wheezing but no distress   Abdominal: Soft. There is no tenderness. There is no rebound and no guarding.  Genitourinary:  Blood in underwear but no blood at the urethral meatus No testicular tenderness   Musculoskeletal: Normal range of motion. He exhibits no edema or tenderness.  Small old appearing bruise to the right flank.   Neurological: He is alert and oriented to person, place, and time. No cranial nerve deficit. He exhibits normal muscle tone. Coordination normal.  No ataxia on finger to nose bilaterally. No pronator drift. 5/5 strength throughout. CN 2-12 intact.Equal grip strength. Sensation intact.   Skin: Skin  is warm.  Psychiatric: He has a normal mood and affect. His behavior is normal.  Nursing note and vitals reviewed.   ED Course  Procedures  DIAGNOSTIC STUDIES:    Oxygen Saturation is 95% on 4L/min, adequate by my interpretation.   COORDINATION OF CARE:  12:24 PM Will administer breathing treatment in the ED. Will order CT scan of kidneys and bladder. Discussed treatment plan with pt at bedside and pt agreed to plan.    Labs Review Labs Reviewed  URINALYSIS, ROUTINE W REFLEX MICROSCOPIC (NOT AT [facility]) - Abnormal; Notable for the following:    Color, Urine ORANGE (*)    APPearance HAZY (*)    Hgb urine dipstick LARGE (*)    Bilirubin Urine SMALL (*)    Protein, ur 30 (*)    Leukocytes, UA TRACE (*)    All other components within normal limits  CBC WITH DIFFERENTIAL/PLATELET - Abnormal; Notable for the following:    WBC 12.1 (*)    RBC 4.13 (*)    Hemoglobin 12.7 (*)    HCT 38.7 (*)    Lymphs Abs 4.1 (*)    Monocytes Absolute 1.3 (*)    All other components within normal limits  BASIC METABOLIC PANEL - Abnormal; Notable for the following:    Chloride 98 (*)    CO2 34 (*)    All other components within normal limits  URINE  MICROSCOPIC-ADD ON - Abnormal; Notable for the following:    Squamous Epithelial / LPF 0-5 (*)    All other components within normal limits  URINE CULTURE  PROTIME-INR    Imaging Review Dg Chest 2 View  05/07/2015  CLINICAL DATA:  75 year old male with wheezing and cough. Personal history of bladder tumor. Initial encounter. EXAM: CHEST  2 VIEW COMPARISON:  CT Abdomen and Pelvis 1336 hours today, and earlier. FINDINGS: Centrilobular emphysema demonstrated on prior CTs. Stable lung volumes. Chronic architectural distortion in the peripheral right upper lobe. No pneumothorax, pulmonary edema, pleural effusion or acute pulmonary opacity. Normal cardiac size and mediastinal contours. Visualized tracheal air column is within normal limits. Osteopenia. No acute osseous abnormality identified. IMPRESSION: Chronic lung disease with emphysema. No acute cardiopulmonary abnormality. Electronically Signed   By: Genevie Ann M.D.   On: 05/07/2015 14:35   Ct Renal Stone Study  05/07/2015  CLINICAL DATA:  Bloody urine with known history of prior bladder tumor EXAM: CT ABDOMEN AND PELVIS WITHOUT CONTRAST TECHNIQUE: Multidetector CT imaging of the abdomen and pelvis was performed following the standard protocol without IV contrast. COMPARISON:  10/21/2014 FINDINGS: Lung bases demonstrate some emphysematous changes although no focal infiltrate or sizable parenchymal nodule is seen. The liver, gallbladder, spleen, adrenal glands and pancreas are all normal in their CT appearance. The kidneys are well visualized and again demonstrate small cysts bilaterally as well as some scarring particularly on the right. No renal calculi or obstructive changes are noted. The ureters are within normal limits. The bladder is partially distended. There is evidence of a filling defect within the bladder anteriorly which measures approximately 2.4 x 1.3 cm. There is some additional bladder wall thickening identified adjacent to this mass lesion  which may be in part due to incomplete distension. No pelvic mass lesion is noted. No significant lymphadenopathy is seen. The appendix is within normal limits. Scattered diverticular changes noted. The bony structures are within normal limits for the patient's given age. IMPRESSION: Evidence of a bladder mass. Given the patient's clinical history this likely represents some localized  recurrence of bladder carcinoma. Direct visualization is again recommended. Electronically Signed   By: Inez Catalina M.D.   On: 05/07/2015 14:21   I have personally reviewed and evaluated these images and lab results as part of my medical decision-making.   EKG Interpretation None      MDM   Final diagnoses:  Hematuria  Bladder mass   Hematuria for the past 2-3 days, no pain. Daughter states patient fell against a table about one week ago and may have hit his low back. History of bladder cancer status post resection in 2013.  UA shows gross hematuria. Hemoglobin is stable. Patient given breathing treatments for wheezing.  Chest x-ray is stable. CT shows evidence of recurrence of bladder mass. This was discussed with Dr. Risa Grill of Urology. Patient is urinating well so we'll not place Foley catheter at this time. Outpatient follow-up for cystoscopy.  Patient states his breathing is at baseline. He is moving air well and saturating 99% on 3 L. He denies any chest pain or shortness of breath.  Patient's daughter prefers patient to follow up with his urologist in [place]. Advised to follow-up with urologist for cystoscopy. Return precautions discussed.  I personally performed the services described in this documentation, which was scribed in my presence. The recorded information has been reviewed and is accurate.    Ezequiel Essex, MD 05/07/15 [phone]

## 2015-05-08 ENCOUNTER — Ambulatory Visit: Payer: Medicare HMO | Admitting: Family Medicine

## 2015-05-08 LAB — URINE CULTURE: Culture: 1000

## 2015-05-09 ENCOUNTER — Encounter: Payer: Self-pay | Admitting: Family Medicine

## 2015-05-10 ENCOUNTER — Telehealth: Payer: Self-pay | Admitting: Family Medicine

## 2015-05-11 NOTE — Telephone Encounter (Signed)
Pt is aware that it is too early for a refill and that he can't get one until Feb 1st as he was given enough to last for 30 days.

## 2015-05-18 ENCOUNTER — Ambulatory Visit (INDEPENDENT_AMBULATORY_CARE_PROVIDER_SITE_OTHER): Payer: Commercial Managed Care - HMO | Admitting: Family Medicine

## 2015-05-18 ENCOUNTER — Encounter: Payer: Self-pay | Admitting: Family Medicine

## 2015-05-18 VITALS — BP 108/75 | HR 112 | Temp 97.3°F | Ht 65.0 in | Wt 159.2 lb

## 2015-05-18 DIAGNOSIS — R319 Hematuria, unspecified: Secondary | ICD-10-CM

## 2015-05-18 DIAGNOSIS — R310 Gross hematuria: Secondary | ICD-10-CM

## 2015-05-18 DIAGNOSIS — Z720 Tobacco use: Secondary | ICD-10-CM

## 2015-05-18 DIAGNOSIS — D494 Neoplasm of unspecified behavior of bladder: Secondary | ICD-10-CM

## 2015-05-18 DIAGNOSIS — F419 Anxiety disorder, unspecified: Secondary | ICD-10-CM

## 2015-05-18 LAB — POCT URINALYSIS DIPSTICK

## 2015-05-18 LAB — POCT UA - MICROSCOPIC ONLY
Bacteria, U Microscopic: NEGATIVE
CASTS, UR, LPF, POC: NEGATIVE
Crystals, Ur, HPF, POC: NEGATIVE
Mucus, UA: NEGATIVE
Yeast, UA: NEGATIVE

## 2015-05-18 MED ORDER — DULOXETINE HCL 60 MG PO CPEP: 60.0000 mg | ORAL_CAPSULE | Freq: Every day | ORAL | Status: DC

## 2015-05-18 NOTE — Progress Notes (Signed)
   HPI  Patient presents today with hematuria and for ER follow-up.  Patient explains that he went to [facility] emergency room about 10 days ago for intermittent hematuria. He states that he's been having this problem for about 2 weeks off and on. He does have burning after urination.  He's breathing at his baseline, he has beer COPD and continues to smoke. He denies any fever, chills, sweats, or flank pain.  They come in for a referral, however they have not called his urologist from [place].  PMH: Smoking status noted ROS: Per HPI  Objective: BP 108/75 mmHg  Pulse 112  Temp(Src) 97.3 F (36.3 C) (Oral)  Ht _0  (1.651 m)  Wt 159 lb 3.2 oz (72.213 kg)  BMI 26.49 kg/m2 Gen: NAD, alert, cooperative with exam HEENT: NCAT, nasal cannula in place CV: RRR, good S1/S2, no murmur Resp: Nonlabored, poor air movement, expiratory wheezes in bilateral upper lung fields Abd: Soft, tenderness to palpation of the suprapubic area-mild Ext: No edema, warm Neuro: Alert and oriented, No gross deficits  Assessment and plan:  # Bladder mass, hematuria Patient with likely recurrent bladder cancer Recommended calling their urologist today, I have given them the phone number. Urinalysis today shows blood but no discrete signs for infection, sent for culture  # Anxiety His anxiety is doing well, he has been on Xanax for several years, he takes about 0.5 mg daily. I discussed with him the dangers of taking benzodiazepines with end-stage COPD He will minimize the dose that continue. Discontinue Celexa, he is tolerating Cymbalta well, although him titrate Cymbalta 60 mg.  # tobacco abuse Not ready to quit- discussed   Orders Placed This Encounter  Procedures  . CBC with Differential  . CMP14+EGFR  . POCT urinalysis dipstick  . POCT UA - Microscopic Only     Laroy Apple, MD [place] 05/18/2015, 10:00 AM

## 2015-05-18 NOTE — Patient Instructions (Addendum)
Great to see you guys!  Please Call [phone] for an appointment today, I do not think you need a referral.   He likely has bladder cancer, They will see him.     Stop citalopram, cymbalta replaces this.

## 2015-05-23 ENCOUNTER — Ambulatory Visit: Payer: Medicare HMO | Admitting: Family Medicine

## 2015-05-28 DIAGNOSIS — A419 Sepsis, unspecified organism: Secondary | ICD-10-CM | POA: Diagnosis not present

## 2015-05-28 DIAGNOSIS — R51 Headache: Secondary | ICD-10-CM | POA: Diagnosis not present

## 2015-05-28 DIAGNOSIS — J449 Chronic obstructive pulmonary disease, unspecified: Secondary | ICD-10-CM | POA: Diagnosis not present

## 2015-05-28 DIAGNOSIS — J9611 Chronic respiratory failure with hypoxia: Secondary | ICD-10-CM | POA: Diagnosis not present

## 2015-05-31 DIAGNOSIS — Z792 Long term (current) use of antibiotics: Secondary | ICD-10-CM | POA: Diagnosis not present

## 2015-05-31 DIAGNOSIS — Z7951 Long term (current) use of inhaled steroids: Secondary | ICD-10-CM | POA: Diagnosis not present

## 2015-05-31 DIAGNOSIS — F17200 Nicotine dependence, unspecified, uncomplicated: Secondary | ICD-10-CM | POA: Diagnosis not present

## 2015-05-31 DIAGNOSIS — Z8550 Personal history of malignant neoplasm of unspecified urinary tract organ: Secondary | ICD-10-CM | POA: Diagnosis not present

## 2015-05-31 DIAGNOSIS — Z7982 Long term (current) use of aspirin: Secondary | ICD-10-CM | POA: Diagnosis not present

## 2015-05-31 DIAGNOSIS — E7800 Pure hypercholesterolemia, unspecified: Secondary | ICD-10-CM | POA: Diagnosis not present

## 2015-05-31 DIAGNOSIS — C679 Malignant neoplasm of bladder, unspecified: Secondary | ICD-10-CM | POA: Diagnosis not present

## 2015-05-31 DIAGNOSIS — Z9981 Dependence on supplemental oxygen: Secondary | ICD-10-CM | POA: Diagnosis not present

## 2015-05-31 DIAGNOSIS — J45909 Unspecified asthma, uncomplicated: Secondary | ICD-10-CM | POA: Diagnosis not present

## 2015-05-31 DIAGNOSIS — C678 Malignant neoplasm of overlapping sites of bladder: Secondary | ICD-10-CM | POA: Diagnosis not present

## 2015-05-31 DIAGNOSIS — I2510 Atherosclerotic heart disease of native coronary artery without angina pectoris: Secondary | ICD-10-CM | POA: Diagnosis not present

## 2015-06-23 DIAGNOSIS — J449 Chronic obstructive pulmonary disease, unspecified: Secondary | ICD-10-CM | POA: Diagnosis not present

## 2015-06-23 DIAGNOSIS — J9611 Chronic respiratory failure with hypoxia: Secondary | ICD-10-CM | POA: Diagnosis not present

## 2015-06-23 DIAGNOSIS — D303 Benign neoplasm of bladder: Secondary | ICD-10-CM | POA: Diagnosis not present

## 2015-06-23 DIAGNOSIS — K219 Gastro-esophageal reflux disease without esophagitis: Secondary | ICD-10-CM | POA: Diagnosis not present

## 2015-06-23 DIAGNOSIS — D090 Carcinoma in situ of bladder: Secondary | ICD-10-CM | POA: Diagnosis not present

## 2015-06-23 DIAGNOSIS — F419 Anxiety disorder, unspecified: Secondary | ICD-10-CM | POA: Diagnosis not present

## 2015-06-23 DIAGNOSIS — K5900 Constipation, unspecified: Secondary | ICD-10-CM | POA: Diagnosis not present

## 2015-06-23 DIAGNOSIS — C679 Malignant neoplasm of bladder, unspecified: Secondary | ICD-10-CM | POA: Diagnosis not present

## 2015-06-23 DIAGNOSIS — I2510 Atherosclerotic heart disease of native coronary artery without angina pectoris: Secondary | ICD-10-CM | POA: Diagnosis not present

## 2015-06-23 DIAGNOSIS — D494 Neoplasm of unspecified behavior of bladder: Secondary | ICD-10-CM | POA: Diagnosis not present

## 2015-06-23 DIAGNOSIS — E785 Hyperlipidemia, unspecified: Secondary | ICD-10-CM | POA: Diagnosis not present

## 2015-06-23 DIAGNOSIS — Z8551 Personal history of malignant neoplasm of bladder: Secondary | ICD-10-CM | POA: Diagnosis not present

## 2015-06-24 ENCOUNTER — Telehealth: Payer: Self-pay | Admitting: Family Medicine

## 2015-06-24 DIAGNOSIS — K5900 Constipation, unspecified: Secondary | ICD-10-CM | POA: Diagnosis not present

## 2015-06-24 DIAGNOSIS — F419 Anxiety disorder, unspecified: Secondary | ICD-10-CM | POA: Diagnosis not present

## 2015-06-24 DIAGNOSIS — D303 Benign neoplasm of bladder: Secondary | ICD-10-CM | POA: Diagnosis not present

## 2015-06-24 DIAGNOSIS — J449 Chronic obstructive pulmonary disease, unspecified: Secondary | ICD-10-CM | POA: Diagnosis not present

## 2015-06-24 DIAGNOSIS — K219 Gastro-esophageal reflux disease without esophagitis: Secondary | ICD-10-CM | POA: Diagnosis not present

## 2015-06-24 DIAGNOSIS — D090 Carcinoma in situ of bladder: Secondary | ICD-10-CM | POA: Diagnosis not present

## 2015-06-24 DIAGNOSIS — E785 Hyperlipidemia, unspecified: Secondary | ICD-10-CM | POA: Diagnosis not present

## 2015-06-24 DIAGNOSIS — J9611 Chronic respiratory failure with hypoxia: Secondary | ICD-10-CM | POA: Diagnosis not present

## 2015-06-24 DIAGNOSIS — I2510 Atherosclerotic heart disease of native coronary artery without angina pectoris: Secondary | ICD-10-CM | POA: Diagnosis not present

## 2015-06-24 NOTE — Telephone Encounter (Signed)
Patients daughter aware that he will have to be seen to try and get home health in to help.

## 2015-06-25 DIAGNOSIS — E785 Hyperlipidemia, unspecified: Secondary | ICD-10-CM | POA: Diagnosis not present

## 2015-06-25 DIAGNOSIS — I2510 Atherosclerotic heart disease of native coronary artery without angina pectoris: Secondary | ICD-10-CM | POA: Diagnosis not present

## 2015-06-25 DIAGNOSIS — D090 Carcinoma in situ of bladder: Secondary | ICD-10-CM | POA: Diagnosis not present

## 2015-06-25 DIAGNOSIS — A419 Sepsis, unspecified organism: Secondary | ICD-10-CM | POA: Diagnosis not present

## 2015-06-25 DIAGNOSIS — J9611 Chronic respiratory failure with hypoxia: Secondary | ICD-10-CM | POA: Diagnosis not present

## 2015-06-25 DIAGNOSIS — K5900 Constipation, unspecified: Secondary | ICD-10-CM | POA: Diagnosis not present

## 2015-06-25 DIAGNOSIS — R51 Headache: Secondary | ICD-10-CM | POA: Diagnosis not present

## 2015-06-25 DIAGNOSIS — D303 Benign neoplasm of bladder: Secondary | ICD-10-CM | POA: Diagnosis not present

## 2015-06-25 DIAGNOSIS — K219 Gastro-esophageal reflux disease without esophagitis: Secondary | ICD-10-CM | POA: Diagnosis not present

## 2015-06-25 DIAGNOSIS — F419 Anxiety disorder, unspecified: Secondary | ICD-10-CM | POA: Diagnosis not present

## 2015-06-25 DIAGNOSIS — J449 Chronic obstructive pulmonary disease, unspecified: Secondary | ICD-10-CM | POA: Diagnosis not present

## 2015-06-26 DIAGNOSIS — K5900 Constipation, unspecified: Secondary | ICD-10-CM | POA: Diagnosis not present

## 2015-06-26 DIAGNOSIS — E785 Hyperlipidemia, unspecified: Secondary | ICD-10-CM | POA: Diagnosis not present

## 2015-06-26 DIAGNOSIS — K219 Gastro-esophageal reflux disease without esophagitis: Secondary | ICD-10-CM | POA: Diagnosis not present

## 2015-06-26 DIAGNOSIS — J449 Chronic obstructive pulmonary disease, unspecified: Secondary | ICD-10-CM | POA: Diagnosis not present

## 2015-06-26 DIAGNOSIS — D303 Benign neoplasm of bladder: Secondary | ICD-10-CM | POA: Diagnosis not present

## 2015-06-26 DIAGNOSIS — D090 Carcinoma in situ of bladder: Secondary | ICD-10-CM | POA: Diagnosis not present

## 2015-06-26 DIAGNOSIS — I2510 Atherosclerotic heart disease of native coronary artery without angina pectoris: Secondary | ICD-10-CM | POA: Diagnosis not present

## 2015-06-26 DIAGNOSIS — F419 Anxiety disorder, unspecified: Secondary | ICD-10-CM | POA: Diagnosis not present

## 2015-06-26 DIAGNOSIS — J9611 Chronic respiratory failure with hypoxia: Secondary | ICD-10-CM | POA: Diagnosis not present

## 2015-06-27 ENCOUNTER — Ambulatory Visit: Payer: Commercial Managed Care - HMO | Admitting: Family Medicine

## 2015-06-27 DIAGNOSIS — R0602 Shortness of breath: Secondary | ICD-10-CM | POA: Diagnosis not present

## 2015-06-27 DIAGNOSIS — K5900 Constipation, unspecified: Secondary | ICD-10-CM | POA: Diagnosis not present

## 2015-06-27 DIAGNOSIS — D090 Carcinoma in situ of bladder: Secondary | ICD-10-CM | POA: Diagnosis not present

## 2015-06-27 DIAGNOSIS — F419 Anxiety disorder, unspecified: Secondary | ICD-10-CM | POA: Diagnosis not present

## 2015-06-27 DIAGNOSIS — M6281 Muscle weakness (generalized): Secondary | ICD-10-CM | POA: Diagnosis not present

## 2015-06-27 DIAGNOSIS — J9611 Chronic respiratory failure with hypoxia: Secondary | ICD-10-CM | POA: Diagnosis not present

## 2015-06-27 DIAGNOSIS — J9621 Acute and chronic respiratory failure with hypoxia: Secondary | ICD-10-CM | POA: Diagnosis not present

## 2015-06-27 DIAGNOSIS — R279 Unspecified lack of coordination: Secondary | ICD-10-CM | POA: Diagnosis not present

## 2015-06-27 DIAGNOSIS — C678 Malignant neoplasm of overlapping sites of bladder: Secondary | ICD-10-CM | POA: Diagnosis not present

## 2015-06-27 DIAGNOSIS — K219 Gastro-esophageal reflux disease without esophagitis: Secondary | ICD-10-CM | POA: Diagnosis not present

## 2015-06-27 DIAGNOSIS — I2510 Atherosclerotic heart disease of native coronary artery without angina pectoris: Secondary | ICD-10-CM | POA: Diagnosis not present

## 2015-06-27 DIAGNOSIS — F339 Major depressive disorder, recurrent, unspecified: Secondary | ICD-10-CM | POA: Diagnosis not present

## 2015-06-27 DIAGNOSIS — J449 Chronic obstructive pulmonary disease, unspecified: Secondary | ICD-10-CM | POA: Diagnosis not present

## 2015-06-27 DIAGNOSIS — C679 Malignant neoplasm of bladder, unspecified: Secondary | ICD-10-CM | POA: Diagnosis not present

## 2015-06-27 DIAGNOSIS — Z7401 Bed confinement status: Secondary | ICD-10-CM | POA: Diagnosis not present

## 2015-06-27 DIAGNOSIS — D303 Benign neoplasm of bladder: Secondary | ICD-10-CM | POA: Diagnosis not present

## 2015-06-27 DIAGNOSIS — E784 Other hyperlipidemia: Secondary | ICD-10-CM | POA: Diagnosis not present

## 2015-06-27 DIAGNOSIS — I25118 Atherosclerotic heart disease of native coronary artery with other forms of angina pectoris: Secondary | ICD-10-CM | POA: Diagnosis not present

## 2015-06-27 DIAGNOSIS — J441 Chronic obstructive pulmonary disease with (acute) exacerbation: Secondary | ICD-10-CM | POA: Diagnosis not present

## 2015-06-27 DIAGNOSIS — C801 Malignant (primary) neoplasm, unspecified: Secondary | ICD-10-CM | POA: Diagnosis not present

## 2015-06-27 DIAGNOSIS — E785 Hyperlipidemia, unspecified: Secondary | ICD-10-CM | POA: Diagnosis not present

## 2015-06-28 ENCOUNTER — Encounter: Payer: Self-pay | Admitting: Family Medicine

## 2015-06-29 DIAGNOSIS — K219 Gastro-esophageal reflux disease without esophagitis: Secondary | ICD-10-CM | POA: Diagnosis not present

## 2015-06-29 DIAGNOSIS — I25118 Atherosclerotic heart disease of native coronary artery with other forms of angina pectoris: Secondary | ICD-10-CM | POA: Diagnosis not present

## 2015-06-29 DIAGNOSIS — C678 Malignant neoplasm of overlapping sites of bladder: Secondary | ICD-10-CM | POA: Diagnosis not present

## 2015-06-29 DIAGNOSIS — J441 Chronic obstructive pulmonary disease with (acute) exacerbation: Secondary | ICD-10-CM | POA: Diagnosis not present

## 2015-06-29 DIAGNOSIS — E784 Other hyperlipidemia: Secondary | ICD-10-CM | POA: Diagnosis not present

## 2015-07-05 DIAGNOSIS — C679 Malignant neoplasm of bladder, unspecified: Secondary | ICD-10-CM | POA: Diagnosis not present

## 2015-07-07 DIAGNOSIS — Z7982 Long term (current) use of aspirin: Secondary | ICD-10-CM | POA: Diagnosis not present

## 2015-07-07 DIAGNOSIS — Z9981 Dependence on supplemental oxygen: Secondary | ICD-10-CM | POA: Diagnosis not present

## 2015-07-07 DIAGNOSIS — J9611 Chronic respiratory failure with hypoxia: Secondary | ICD-10-CM | POA: Diagnosis not present

## 2015-07-07 DIAGNOSIS — J449 Chronic obstructive pulmonary disease, unspecified: Secondary | ICD-10-CM | POA: Diagnosis not present

## 2015-07-07 DIAGNOSIS — C679 Malignant neoplasm of bladder, unspecified: Secondary | ICD-10-CM | POA: Diagnosis not present

## 2015-07-10 ENCOUNTER — Ambulatory Visit (INDEPENDENT_AMBULATORY_CARE_PROVIDER_SITE_OTHER): Payer: Commercial Managed Care - HMO | Admitting: Family Medicine

## 2015-07-10 ENCOUNTER — Encounter: Payer: Self-pay | Admitting: Family Medicine

## 2015-07-10 VITALS — BP 110/77 | HR 108 | Temp 97.6°F | Ht 65.0 in | Wt 158.6 lb

## 2015-07-10 DIAGNOSIS — J9611 Chronic respiratory failure with hypoxia: Secondary | ICD-10-CM | POA: Diagnosis not present

## 2015-07-10 DIAGNOSIS — F17200 Nicotine dependence, unspecified, uncomplicated: Secondary | ICD-10-CM

## 2015-07-10 DIAGNOSIS — Z720 Tobacco use: Secondary | ICD-10-CM | POA: Diagnosis not present

## 2015-07-10 DIAGNOSIS — J439 Emphysema, unspecified: Secondary | ICD-10-CM

## 2015-07-10 DIAGNOSIS — F419 Anxiety disorder, unspecified: Secondary | ICD-10-CM | POA: Diagnosis not present

## 2015-07-10 NOTE — Patient Instructions (Signed)
Great to see you!  Come back in 1 month  Cut back to no more than 3 xanax a day

## 2015-07-10 NOTE — Progress Notes (Addendum)
   HPI  Patient presents today  Follow-up after nursing home discharge.  Patient explains that he return ed to [facility] where he had the bladder tunmor removed with TURBt.  He is going back to uro this week to get BCG  He denies any complaints and understands his medicines.   He cpontinues to take xanax for Zaidi and sleep. He states that he breaks the pills in half and only takes 2-3 a day. He's been doing this for for several years, he is not aware of respiratory depression with benzodiazepine  PMH: Smoking status noted ROS: Per HPI  Objective: BP 110/77 mmHg  Pulse 108  Temp(Src) 97.6 F (36.4 C) (Oral)  Ht 5\' 5"  (1.651 m)  Wt 158 lb 9.6 oz (71.94 kg)  BMI 26.39 kg/m2 Gen: NAD, alert, cooperative with exam HEENT: NCAT CV: RRR, good S1/S2, no murmur Resp: CTABL, no wheezes, non-labored , nasal cannula in place Ext: No edema, warm Neuro: Alert and oriented, No gross deficits  Assessment and plan:  #COPD, pulmonary emphysema, chronic respiratory failure with hypoxia Oxygen-dependent Breathing is at baseline. Continue Symbicort and albuterol, dlairesp discussed the importance of stopping smoking  # anxiety Discussed the dangers around using benzodiazepines withsevere COPD He has reduced his dose to0.5 mg Xanax 2-3 times daily i discussed that we need to limit this as much as possible On review of the [place] controlled substance database he'is been getting 120 Xanax every month for several months.  PLan  next prescription to be 0.5 mg 2 or 3 times a day, #90  # bladder tumor Status post TURBt, has good follow up, planning BCG  # Tobacco abuse Encouraged cessation  Follow up 1 month  Need to discuss pulmonary nodule follow up CT, last CT over 7/16 at [place], MD [facility] 07/10/2015, 10:54 AM

## 2015-07-12 ENCOUNTER — Other Ambulatory Visit: Payer: Self-pay

## 2015-07-12 DIAGNOSIS — R319 Hematuria, unspecified: Secondary | ICD-10-CM

## 2015-07-12 DIAGNOSIS — J449 Chronic obstructive pulmonary disease, unspecified: Secondary | ICD-10-CM | POA: Diagnosis not present

## 2015-07-12 DIAGNOSIS — J9611 Chronic respiratory failure with hypoxia: Secondary | ICD-10-CM | POA: Diagnosis not present

## 2015-07-12 DIAGNOSIS — Z9981 Dependence on supplemental oxygen: Secondary | ICD-10-CM | POA: Diagnosis not present

## 2015-07-12 DIAGNOSIS — Z7982 Long term (current) use of aspirin: Secondary | ICD-10-CM | POA: Diagnosis not present

## 2015-07-12 DIAGNOSIS — C679 Malignant neoplasm of bladder, unspecified: Secondary | ICD-10-CM | POA: Diagnosis not present

## 2015-07-13 DIAGNOSIS — Z9981 Dependence on supplemental oxygen: Secondary | ICD-10-CM | POA: Diagnosis not present

## 2015-07-13 DIAGNOSIS — J9611 Chronic respiratory failure with hypoxia: Secondary | ICD-10-CM | POA: Diagnosis not present

## 2015-07-13 DIAGNOSIS — J449 Chronic obstructive pulmonary disease, unspecified: Secondary | ICD-10-CM | POA: Diagnosis not present

## 2015-07-13 DIAGNOSIS — Z7982 Long term (current) use of aspirin: Secondary | ICD-10-CM | POA: Diagnosis not present

## 2015-07-13 DIAGNOSIS — C679 Malignant neoplasm of bladder, unspecified: Secondary | ICD-10-CM | POA: Diagnosis not present

## 2015-07-14 DIAGNOSIS — Z9981 Dependence on supplemental oxygen: Secondary | ICD-10-CM | POA: Diagnosis not present

## 2015-07-14 DIAGNOSIS — J449 Chronic obstructive pulmonary disease, unspecified: Secondary | ICD-10-CM | POA: Diagnosis not present

## 2015-07-14 DIAGNOSIS — J9611 Chronic respiratory failure with hypoxia: Secondary | ICD-10-CM | POA: Diagnosis not present

## 2015-07-14 DIAGNOSIS — Z7982 Long term (current) use of aspirin: Secondary | ICD-10-CM | POA: Diagnosis not present

## 2015-07-14 DIAGNOSIS — C679 Malignant neoplasm of bladder, unspecified: Secondary | ICD-10-CM | POA: Diagnosis not present

## 2015-07-18 DIAGNOSIS — Z9981 Dependence on supplemental oxygen: Secondary | ICD-10-CM | POA: Diagnosis not present

## 2015-07-18 DIAGNOSIS — J9611 Chronic respiratory failure with hypoxia: Secondary | ICD-10-CM | POA: Diagnosis not present

## 2015-07-18 DIAGNOSIS — C679 Malignant neoplasm of bladder, unspecified: Secondary | ICD-10-CM | POA: Diagnosis not present

## 2015-07-18 DIAGNOSIS — J449 Chronic obstructive pulmonary disease, unspecified: Secondary | ICD-10-CM | POA: Diagnosis not present

## 2015-07-18 DIAGNOSIS — Z7982 Long term (current) use of aspirin: Secondary | ICD-10-CM | POA: Diagnosis not present

## 2015-07-19 DIAGNOSIS — C679 Malignant neoplasm of bladder, unspecified: Secondary | ICD-10-CM | POA: Diagnosis not present

## 2015-07-19 DIAGNOSIS — J9611 Chronic respiratory failure with hypoxia: Secondary | ICD-10-CM | POA: Diagnosis not present

## 2015-07-19 DIAGNOSIS — J449 Chronic obstructive pulmonary disease, unspecified: Secondary | ICD-10-CM | POA: Diagnosis not present

## 2015-07-19 DIAGNOSIS — Z7982 Long term (current) use of aspirin: Secondary | ICD-10-CM | POA: Diagnosis not present

## 2015-07-19 DIAGNOSIS — Z9981 Dependence on supplemental oxygen: Secondary | ICD-10-CM | POA: Diagnosis not present

## 2015-07-20 DIAGNOSIS — C679 Malignant neoplasm of bladder, unspecified: Secondary | ICD-10-CM | POA: Diagnosis not present

## 2015-07-20 DIAGNOSIS — J9611 Chronic respiratory failure with hypoxia: Secondary | ICD-10-CM | POA: Diagnosis not present

## 2015-07-20 DIAGNOSIS — J449 Chronic obstructive pulmonary disease, unspecified: Secondary | ICD-10-CM | POA: Diagnosis not present

## 2015-07-20 DIAGNOSIS — Z7982 Long term (current) use of aspirin: Secondary | ICD-10-CM | POA: Diagnosis not present

## 2015-07-20 DIAGNOSIS — Z9981 Dependence on supplemental oxygen: Secondary | ICD-10-CM | POA: Diagnosis not present

## 2015-07-24 DIAGNOSIS — J449 Chronic obstructive pulmonary disease, unspecified: Secondary | ICD-10-CM | POA: Diagnosis not present

## 2015-07-24 DIAGNOSIS — C679 Malignant neoplasm of bladder, unspecified: Secondary | ICD-10-CM | POA: Diagnosis not present

## 2015-07-24 DIAGNOSIS — Z7982 Long term (current) use of aspirin: Secondary | ICD-10-CM | POA: Diagnosis not present

## 2015-07-24 DIAGNOSIS — Z9981 Dependence on supplemental oxygen: Secondary | ICD-10-CM | POA: Diagnosis not present

## 2015-07-24 DIAGNOSIS — J9611 Chronic respiratory failure with hypoxia: Secondary | ICD-10-CM | POA: Diagnosis not present

## 2015-07-25 DIAGNOSIS — J449 Chronic obstructive pulmonary disease, unspecified: Secondary | ICD-10-CM | POA: Diagnosis not present

## 2015-07-25 DIAGNOSIS — Z7982 Long term (current) use of aspirin: Secondary | ICD-10-CM | POA: Diagnosis not present

## 2015-07-25 DIAGNOSIS — J9611 Chronic respiratory failure with hypoxia: Secondary | ICD-10-CM | POA: Diagnosis not present

## 2015-07-25 DIAGNOSIS — C679 Malignant neoplasm of bladder, unspecified: Secondary | ICD-10-CM | POA: Diagnosis not present

## 2015-07-25 DIAGNOSIS — Z9981 Dependence on supplemental oxygen: Secondary | ICD-10-CM | POA: Diagnosis not present

## 2015-07-26 DIAGNOSIS — R51 Headache: Secondary | ICD-10-CM | POA: Diagnosis not present

## 2015-07-26 DIAGNOSIS — J449 Chronic obstructive pulmonary disease, unspecified: Secondary | ICD-10-CM | POA: Diagnosis not present

## 2015-07-26 DIAGNOSIS — A419 Sepsis, unspecified organism: Secondary | ICD-10-CM | POA: Diagnosis not present

## 2015-07-26 DIAGNOSIS — J9611 Chronic respiratory failure with hypoxia: Secondary | ICD-10-CM | POA: Diagnosis not present

## 2015-07-27 DIAGNOSIS — J449 Chronic obstructive pulmonary disease, unspecified: Secondary | ICD-10-CM | POA: Diagnosis not present

## 2015-07-27 DIAGNOSIS — Z9981 Dependence on supplemental oxygen: Secondary | ICD-10-CM | POA: Diagnosis not present

## 2015-07-27 DIAGNOSIS — Z7982 Long term (current) use of aspirin: Secondary | ICD-10-CM | POA: Diagnosis not present

## 2015-07-27 DIAGNOSIS — J9611 Chronic respiratory failure with hypoxia: Secondary | ICD-10-CM | POA: Diagnosis not present

## 2015-07-27 DIAGNOSIS — C679 Malignant neoplasm of bladder, unspecified: Secondary | ICD-10-CM | POA: Diagnosis not present

## 2015-07-31 ENCOUNTER — Telehealth: Payer: Self-pay

## 2015-07-31 NOTE — Telephone Encounter (Signed)
Would recommend being seen. I don't know of any reason to refer and usually need to eval [prior to referrals.   Also there are no ENTs in [place] that I know of.   Laroy Apple, MD [facility] 07/31/2015, 12:48 PM

## 2015-07-31 NOTE — Telephone Encounter (Signed)
lmtcb

## 2015-07-31 NOTE — Telephone Encounter (Signed)
Wants to be referred to Ear Dr in [place]

## 2015-08-01 ENCOUNTER — Ambulatory Visit (INDEPENDENT_AMBULATORY_CARE_PROVIDER_SITE_OTHER): Payer: Commercial Managed Care - HMO | Admitting: Family Medicine

## 2015-08-01 ENCOUNTER — Ambulatory Visit: Payer: Commercial Managed Care - HMO | Attending: Audiology | Admitting: Audiology

## 2015-08-01 ENCOUNTER — Encounter: Payer: Self-pay | Admitting: Family Medicine

## 2015-08-01 ENCOUNTER — Encounter (INDEPENDENT_AMBULATORY_CARE_PROVIDER_SITE_OTHER): Payer: Self-pay

## 2015-08-01 VITALS — BP 101/67 | HR 97 | Temp 97.6°F | Ht 65.0 in | Wt 159.6 lb

## 2015-08-01 DIAGNOSIS — H9193 Unspecified hearing loss, bilateral: Secondary | ICD-10-CM

## 2015-08-01 DIAGNOSIS — C679 Malignant neoplasm of bladder, unspecified: Secondary | ICD-10-CM | POA: Diagnosis not present

## 2015-08-01 DIAGNOSIS — J449 Chronic obstructive pulmonary disease, unspecified: Secondary | ICD-10-CM | POA: Diagnosis not present

## 2015-08-01 DIAGNOSIS — J9611 Chronic respiratory failure with hypoxia: Secondary | ICD-10-CM | POA: Diagnosis not present

## 2015-08-01 DIAGNOSIS — Z9981 Dependence on supplemental oxygen: Secondary | ICD-10-CM | POA: Diagnosis not present

## 2015-08-01 DIAGNOSIS — Z7982 Long term (current) use of aspirin: Secondary | ICD-10-CM | POA: Diagnosis not present

## 2015-08-01 NOTE — Patient Instructions (Signed)
Great to see you!  We will call with an appointment to see a hearing doctor hear in [place].   Let me know if the bunion help does not help your leg pain.

## 2015-08-01 NOTE — Progress Notes (Signed)
   HPI  Patient presents today here today for discussion about decreased hearing.  Patient explains that for a long time, over a year, he's had difficulty hearing intermittently.  He would like to be evaluated locally if possible.  He is considering hearing aids.  No current illness. 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 He has an appointment with podiatry next week.  PMH: Smoking status noted ROS: Per HPI  Objective: BP 101/67 mmHg  Pulse 97  Temp(Src) 97.6 F (36.4 C) (Oral)  Ht 5\' 5"  (1.651 m)  Wt 159 lb 9.6 oz (72.394 kg)  BMI 26.56 kg/m2 Gen: NAD, alert, cooperative with exam HEENT: NCAT, TMs normal bilaterally CV: RRR, good S1/S2, no murmur Resp: Nonlabored, oxygen in place via nasal cannula, symmetric expiratory wheeze throughout Ext: No edema, warm Neuro: Alert and oriented, No gross deficits Skin: Several bruises on bilateral forearms, one small lesion on distal forearm with a bandage over it  Assessment and plan:  # Decreased hearing Referral to audiology No signs of cerumen impaction No signs of current infectious etiology    Orders Placed This Encounter  Procedures  . Ambulatory referral to Audiology    Referral Priority:  Routine    Referral Type:  Audiology Exam    Referral Reason:  Specialty Services Required    Number of Visits Requested:  [place], MD [facility] 08/01/2015, 3:14 PM

## 2015-08-04 DIAGNOSIS — R8299 Other abnormal findings in urine: Secondary | ICD-10-CM | POA: Diagnosis not present

## 2015-08-09 DIAGNOSIS — F17210 Nicotine dependence, cigarettes, uncomplicated: Secondary | ICD-10-CM | POA: Diagnosis not present

## 2015-08-09 DIAGNOSIS — I2510 Atherosclerotic heart disease of native coronary artery without angina pectoris: Secondary | ICD-10-CM | POA: Diagnosis not present

## 2015-08-09 DIAGNOSIS — J45909 Unspecified asthma, uncomplicated: Secondary | ICD-10-CM | POA: Diagnosis not present

## 2015-08-09 DIAGNOSIS — J439 Emphysema, unspecified: Secondary | ICD-10-CM | POA: Diagnosis not present

## 2015-08-09 DIAGNOSIS — C679 Malignant neoplasm of bladder, unspecified: Secondary | ICD-10-CM | POA: Diagnosis not present

## 2015-08-09 DIAGNOSIS — K219 Gastro-esophageal reflux disease without esophagitis: Secondary | ICD-10-CM | POA: Diagnosis not present

## 2015-08-09 DIAGNOSIS — E7800 Pure hypercholesterolemia, unspecified: Secondary | ICD-10-CM | POA: Diagnosis not present

## 2015-08-09 DIAGNOSIS — F419 Anxiety disorder, unspecified: Secondary | ICD-10-CM | POA: Diagnosis not present

## 2015-08-09 DIAGNOSIS — Z9981 Dependence on supplemental oxygen: Secondary | ICD-10-CM | POA: Diagnosis not present

## 2015-08-10 DIAGNOSIS — J9611 Chronic respiratory failure with hypoxia: Secondary | ICD-10-CM | POA: Diagnosis not present

## 2015-08-10 DIAGNOSIS — C679 Malignant neoplasm of bladder, unspecified: Secondary | ICD-10-CM | POA: Diagnosis not present

## 2015-08-10 DIAGNOSIS — Z7982 Long term (current) use of aspirin: Secondary | ICD-10-CM | POA: Diagnosis not present

## 2015-08-10 DIAGNOSIS — Z9981 Dependence on supplemental oxygen: Secondary | ICD-10-CM | POA: Diagnosis not present

## 2015-08-10 DIAGNOSIS — J449 Chronic obstructive pulmonary disease, unspecified: Secondary | ICD-10-CM | POA: Diagnosis not present

## 2015-08-11 ENCOUNTER — Other Ambulatory Visit: Payer: Self-pay | Admitting: Family Medicine

## 2015-08-11 NOTE — Telephone Encounter (Signed)
Cant refill, According to the  controlled substance database he filled an Rx for xanax on 3/31 for 120 pills and has 2 refills. This Rx was written by another provider at another practice on 3/31.   If he is calling here for refills and filling an Rx from another practice this causes some concern misuse.   Laroy Apple, MD [facility] 08/11/2015, 3:51 PM

## 2015-08-17 DIAGNOSIS — I70203 Unspecified atherosclerosis of native arteries of extremities, bilateral legs: Secondary | ICD-10-CM | POA: Diagnosis not present

## 2015-08-17 DIAGNOSIS — L84 Corns and callosities: Secondary | ICD-10-CM | POA: Diagnosis not present

## 2015-08-17 DIAGNOSIS — B351 Tinea unguium: Secondary | ICD-10-CM | POA: Diagnosis not present

## 2015-08-18 ENCOUNTER — Emergency Department (HOSPITAL_COMMUNITY): Payer: Commercial Managed Care - HMO

## 2015-08-18 ENCOUNTER — Inpatient Hospital Stay (HOSPITAL_COMMUNITY)
Admission: EM | Admit: 2015-08-18 | Discharge: 2015-08-22 | DRG: 190 | Disposition: A | Discharge status: Home / Self Care | Payer: Commercial Managed Care - HMO | Attending: Internal Medicine | Admitting: Internal Medicine

## 2015-08-18 ENCOUNTER — Encounter (INDEPENDENT_AMBULATORY_CARE_PROVIDER_SITE_OTHER): Payer: Self-pay

## 2015-08-18 ENCOUNTER — Telehealth: Payer: Self-pay | Admitting: Family Medicine

## 2015-08-18 ENCOUNTER — Ambulatory Visit (INDEPENDENT_AMBULATORY_CARE_PROVIDER_SITE_OTHER): Payer: Commercial Managed Care - HMO | Admitting: Family Medicine

## 2015-08-18 ENCOUNTER — Encounter (HOSPITAL_COMMUNITY): Payer: Self-pay | Admitting: Cardiology

## 2015-08-18 ENCOUNTER — Encounter: Payer: Self-pay | Admitting: Family Medicine

## 2015-08-18 VITALS — BP 110/70 | HR 76 | Temp 97.0°F

## 2015-08-18 DIAGNOSIS — D649 Anemia, unspecified: Secondary | ICD-10-CM | POA: Diagnosis not present

## 2015-08-18 DIAGNOSIS — J440 Chronic obstructive pulmonary disease with acute lower respiratory infection: Secondary | ICD-10-CM | POA: Diagnosis not present

## 2015-08-18 DIAGNOSIS — K219 Gastro-esophageal reflux disease without esophagitis: Secondary | ICD-10-CM | POA: Diagnosis not present

## 2015-08-18 DIAGNOSIS — J9602 Acute respiratory failure with hypercapnia: Secondary | ICD-10-CM

## 2015-08-18 DIAGNOSIS — Z8711 Personal history of peptic ulcer disease: Secondary | ICD-10-CM

## 2015-08-18 DIAGNOSIS — Z7982 Long term (current) use of aspirin: Secondary | ICD-10-CM | POA: Diagnosis not present

## 2015-08-18 DIAGNOSIS — E7800 Pure hypercholesterolemia, unspecified: Secondary | ICD-10-CM | POA: Diagnosis present

## 2015-08-18 DIAGNOSIS — J189 Pneumonia, unspecified organism: Secondary | ICD-10-CM | POA: Diagnosis present

## 2015-08-18 DIAGNOSIS — J45909 Unspecified asthma, uncomplicated: Secondary | ICD-10-CM | POA: Diagnosis present

## 2015-08-18 DIAGNOSIS — J9621 Acute and chronic respiratory failure with hypoxia: Secondary | ICD-10-CM | POA: Diagnosis present

## 2015-08-18 DIAGNOSIS — F17210 Nicotine dependence, cigarettes, uncomplicated: Secondary | ICD-10-CM | POA: Diagnosis not present

## 2015-08-18 DIAGNOSIS — J439 Emphysema, unspecified: Secondary | ICD-10-CM

## 2015-08-18 DIAGNOSIS — Z8550 Personal history of malignant neoplasm of unspecified urinary tract organ: Secondary | ICD-10-CM

## 2015-08-18 DIAGNOSIS — J449 Chronic obstructive pulmonary disease, unspecified: Secondary | ICD-10-CM | POA: Diagnosis not present

## 2015-08-18 DIAGNOSIS — Z7951 Long term (current) use of inhaled steroids: Secondary | ICD-10-CM

## 2015-08-18 DIAGNOSIS — J441 Chronic obstructive pulmonary disease with (acute) exacerbation: Secondary | ICD-10-CM | POA: Diagnosis present

## 2015-08-18 DIAGNOSIS — R0682 Tachypnea, not elsewhere classified: Secondary | ICD-10-CM | POA: Diagnosis not present

## 2015-08-18 DIAGNOSIS — R0602 Shortness of breath: Secondary | ICD-10-CM | POA: Diagnosis not present

## 2015-08-18 DIAGNOSIS — R0603 Acute respiratory distress: Secondary | ICD-10-CM

## 2015-08-18 DIAGNOSIS — J9622 Acute and chronic respiratory failure with hypercapnia: Secondary | ICD-10-CM | POA: Diagnosis not present

## 2015-08-18 DIAGNOSIS — Z720 Tobacco use: Secondary | ICD-10-CM | POA: Diagnosis present

## 2015-08-18 DIAGNOSIS — C679 Malignant neoplasm of bladder, unspecified: Secondary | ICD-10-CM | POA: Diagnosis not present

## 2015-08-18 DIAGNOSIS — J9601 Acute respiratory failure with hypoxia: Secondary | ICD-10-CM | POA: Diagnosis present

## 2015-08-18 DIAGNOSIS — Z9981 Dependence on supplemental oxygen: Secondary | ICD-10-CM | POA: Diagnosis not present

## 2015-08-18 DIAGNOSIS — E785 Hyperlipidemia, unspecified: Secondary | ICD-10-CM | POA: Diagnosis present

## 2015-08-18 DIAGNOSIS — J9600 Acute respiratory failure, unspecified whether with hypoxia or hypercapnia: Secondary | ICD-10-CM | POA: Diagnosis present

## 2015-08-18 DIAGNOSIS — Z825 Family history of asthma and other chronic lower respiratory diseases: Secondary | ICD-10-CM | POA: Diagnosis not present

## 2015-08-18 DIAGNOSIS — Z79899 Other long term (current) drug therapy: Secondary | ICD-10-CM

## 2015-08-18 DIAGNOSIS — R0600 Dyspnea, unspecified: Secondary | ICD-10-CM | POA: Diagnosis not present

## 2015-08-18 DIAGNOSIS — J9611 Chronic respiratory failure with hypoxia: Secondary | ICD-10-CM | POA: Diagnosis not present

## 2015-08-18 DIAGNOSIS — R0902 Hypoxemia: Secondary | ICD-10-CM | POA: Diagnosis not present

## 2015-08-18 DIAGNOSIS — J438 Other emphysema: Secondary | ICD-10-CM | POA: Diagnosis not present

## 2015-08-18 LAB — BASIC METABOLIC PANEL
ANION GAP: 10 (ref 5–15)
BUN: 12 mg/dL (ref 6–20)
CHLORIDE: 93 mmol/L — AB (ref 101–111)
CO2: 31 mmol/L (ref 22–32)
Calcium: 9.7 mg/dL (ref 8.9–10.3)
Creatinine, Ser: 0.9 mg/dL (ref 0.61–1.24)
GFR calc Af Amer: >60 mL/min (ref >60)
Glucose, Bld: 134 mg/dL — ABNORMAL HIGH (ref 65–99)
POTASSIUM: 4.8 mmol/L (ref 3.5–5.1)
SODIUM: 134 mmol/L — AB (ref 135–145)

## 2015-08-18 LAB — CBC WITH DIFFERENTIAL/PLATELET
BASOS ABS: 0 10*3/uL (ref 0.0–0.1)
Basophils Relative: 0 %
EOS ABS: 0.2 10*3/uL (ref 0.0–0.7)
EOS PCT: 1 %
HCT: 37.4 % — ABNORMAL LOW (ref 39.0–52.0)
HEMOGLOBIN: 12.4 g/dL — AB (ref 13.0–17.0)
LYMPHS ABS: 3.8 10*3/uL (ref 0.7–4.0)
LYMPHS PCT: 15 %
MCH: 30.8 pg (ref 26.0–34.0)
MCHC: 33.2 g/dL (ref 30.0–36.0)
MCV: 92.8 fL (ref 78.0–100.0)
Monocytes Absolute: 2.1 10*3/uL — ABNORMAL HIGH (ref 0.1–1.0)
Monocytes Relative: 8 %
NEUTROS PCT: 76 %
Neutro Abs: 18.9 10*3/uL — ABNORMAL HIGH (ref 1.7–7.7)
PLATELETS: 383 10*3/uL (ref 150–400)
RBC: 4.03 MIL/uL — AB (ref 4.22–5.81)
RDW: 13.7 % (ref 11.5–15.5)
WBC: 25 10*3/uL — AB (ref 4.0–10.5)

## 2015-08-18 LAB — BLOOD GAS, ARTERIAL
ACID-BASE EXCESS: 3.9 mmol/L — AB (ref 0.0–2.0)
Bicarbonate: 27.3 mEq/L — ABNORMAL HIGH (ref 20.0–24.0)
Delivery systems: POSITIVE
Drawn by: 419771
EXPIRATORY PAP: 8
FIO2: 0.4
INSPIRATORY PAP: 16
LHR: 14 {breaths}/min
O2 SAT: 95.3 %
PH ART: 7.377 (ref 7.350–7.450)
PO2 ART: 76.8 mmHg — AB (ref 80.0–100.0)
TCO2: 50.2 mmol/L (ref 0–100)
pCO2 arterial: 50.2 mmHg — ABNORMAL HIGH (ref 35.0–45.0)

## 2015-08-18 LAB — TROPONIN I: Troponin I: <0.03 ng/mL (ref <0.031)

## 2015-08-18 MED ORDER — DEXTROSE 5 % IV SOLN: 500.0000 mg | INTRAVENOUS | Status: DC

## 2015-08-18 MED ORDER — ALBUTEROL SULFATE (2.5 MG/3ML) 0.083% IN NEBU: 2.5000 mg | INHALATION_SOLUTION | RESPIRATORY_TRACT | Status: DC

## 2015-08-18 MED ORDER — SIMVASTATIN 20 MG PO TABS
40.0000 mg | ORAL_TABLET | Freq: Every day | ORAL | Status: DC
  Administered 2015-08-19 – 2015-08-20 (×2): 40 mg via ORAL
  Filled 2015-08-18 (×2): qty 2

## 2015-08-18 MED ORDER — ONDANSETRON HCL 4 MG PO TABS: 4.0000 mg | ORAL_TABLET | Freq: Four times a day (QID) | ORAL | Status: DC | PRN

## 2015-08-18 MED ORDER — PANTOPRAZOLE SODIUM 40 MG PO TBEC
80.0000 mg | DELAYED_RELEASE_TABLET | Freq: Every day | ORAL | Status: DC
  Administered 2015-08-19 – 2015-08-22 (×4): 80 mg via ORAL
  Filled 2015-08-18 (×4): qty 2

## 2015-08-18 MED ORDER — ALPRAZOLAM 0.5 MG PO TABS
0.5000 mg | ORAL_TABLET | Freq: Two times a day (BID) | ORAL | Status: DC | PRN
  Administered 2015-08-19 – 2015-08-22 (×5): 1 mg via ORAL
  Filled 2015-08-18 (×5): qty 2

## 2015-08-18 MED ORDER — ONDANSETRON HCL 4 MG/2ML IJ SOLN: 4.0000 mg | Freq: Four times a day (QID) | INTRAMUSCULAR | Status: DC | PRN

## 2015-08-18 MED ORDER — IPRATROPIUM-ALBUTEROL 0.5-2.5 (3) MG/3ML IN SOLN
3.0000 mL | RESPIRATORY_TRACT | Status: DC
  Administered 2015-08-18 – 2015-08-20 (×12): 3 mL via RESPIRATORY_TRACT
  Filled 2015-08-18 (×12): qty 3

## 2015-08-18 MED ORDER — ADULT MULTIVITAMIN W/MINERALS CH
1.0000 | ORAL_TABLET | Freq: Every day | ORAL | Status: DC
  Administered 2015-08-19 – 2015-08-22 (×4): 1 via ORAL
  Filled 2015-08-18 (×4): qty 1

## 2015-08-18 MED ORDER — DEXTROSE 5 % IV SOLN
500.0000 mg | INTRAVENOUS | Status: DC
  Administered 2015-08-18 – 2015-08-20 (×3): 500 mg via INTRAVENOUS
  Filled 2015-08-18 (×4): qty 500

## 2015-08-18 MED ORDER — VITAMIN B-12 1000 MCG PO TABS
2500.0000 ug | ORAL_TABLET | Freq: Every day | ORAL | Status: DC
  Administered 2015-08-19 – 2015-08-22 (×4): 2500 ug via ORAL
  Filled 2015-08-18 (×3): qty 3

## 2015-08-18 MED ORDER — ALBUTEROL (5 MG/ML) CONTINUOUS INHALATION SOLN
2.5000 mg/h | INHALATION_SOLUTION | Freq: Once | RESPIRATORY_TRACT | Status: DC
  Filled 2015-08-18: qty 20

## 2015-08-18 MED ORDER — ALBUTEROL (5 MG/ML) CONTINUOUS INHALATION SOLN
10.0000 mg/h | INHALATION_SOLUTION | RESPIRATORY_TRACT | Status: DC
  Administered 2015-08-18: 10 mg/h via RESPIRATORY_TRACT

## 2015-08-18 MED ORDER — DEXTROSE 5 % IV SOLN
1.0000 g | Freq: Once | INTRAVENOUS | Status: AC
  Administered 2015-08-18: 1 g via INTRAVENOUS
  Filled 2015-08-18: qty 10

## 2015-08-18 MED ORDER — DEXTROSE 5 % IV SOLN
1.0000 g | INTRAVENOUS | Status: DC
  Administered 2015-08-19 – 2015-08-21 (×3): 1 g via INTRAVENOUS
  Filled 2015-08-18 (×4): qty 10

## 2015-08-18 MED ORDER — NITROGLYCERIN 0.4 MG SL SUBL: 0.4000 mg | SUBLINGUAL_TABLET | SUBLINGUAL | Status: DC | PRN

## 2015-08-18 MED ORDER — METHYLPREDNISOLONE SODIUM SUCC 40 MG IJ SOLR
40.0000 mg | Freq: Two times a day (BID) | INTRAMUSCULAR | Status: DC
  Administered 2015-08-18 – 2015-08-22 (×8): 40 mg via INTRAVENOUS
  Filled 2015-08-18 (×8): qty 1

## 2015-08-18 MED ORDER — ASPIRIN EC 81 MG PO TBEC
81.0000 mg | DELAYED_RELEASE_TABLET | Freq: Every morning | ORAL | Status: DC
  Administered 2015-08-19 – 2015-08-22 (×4): 81 mg via ORAL
  Filled 2015-08-18 (×4): qty 1

## 2015-08-18 MED ORDER — ENOXAPARIN SODIUM 40 MG/0.4ML ~~LOC~~ SOLN
40.0000 mg | SUBCUTANEOUS | Status: DC
  Administered 2015-08-18 – 2015-08-21 (×4): 40 mg via SUBCUTANEOUS
  Filled 2015-08-18 (×4): qty 0.4

## 2015-08-18 MED ORDER — SODIUM CHLORIDE 0.9 % IV BOLUS (SEPSIS): 500.0000 mL | Freq: Once | INTRAVENOUS | Status: DC

## 2015-08-18 MED ORDER — ALBUTEROL SULFATE (2.5 MG/3ML) 0.083% IN NEBU: 2.5000 mg | INHALATION_SOLUTION | RESPIRATORY_TRACT | Status: DC | PRN

## 2015-08-18 MED ORDER — DOCUSATE SODIUM 100 MG PO CAPS
100.0000 mg | ORAL_CAPSULE | Freq: Two times a day (BID) | ORAL | Status: DC
  Administered 2015-08-19 – 2015-08-22 (×7): 100 mg via ORAL
  Filled 2015-08-18 (×7): qty 1

## 2015-08-18 MED ORDER — LORATADINE 10 MG PO TABS
10.0000 mg | ORAL_TABLET | Freq: Every day | ORAL | Status: DC
  Administered 2015-08-19 – 2015-08-22 (×4): 10 mg via ORAL
  Filled 2015-08-18 (×4): qty 1

## 2015-08-18 MED ORDER — BUDESONIDE 0.25 MG/2ML IN SUSP
0.2500 mg | Freq: Two times a day (BID) | RESPIRATORY_TRACT | Status: DC
  Administered 2015-08-19 – 2015-08-22 (×7): 0.25 mg via RESPIRATORY_TRACT
  Filled 2015-08-18 (×7): qty 2

## 2015-08-18 MED ORDER — CETYLPYRIDINIUM CHLORIDE 0.05 % MT LIQD: 7.0000 mL | Freq: Two times a day (BID) | OROMUCOSAL | Status: DC

## 2015-08-18 MED ORDER — CHLORHEXIDINE GLUCONATE 0.12 % MT SOLN
15.0000 mL | Freq: Two times a day (BID) | OROMUCOSAL | Status: DC
  Administered 2015-08-18 – 2015-08-19 (×2): 15 mL via OROMUCOSAL
  Filled 2015-08-18: qty 15

## 2015-08-18 MED ORDER — ACETAMINOPHEN 325 MG PO TABS
650.0000 mg | ORAL_TABLET | Freq: Four times a day (QID) | ORAL | Status: DC | PRN
Start: 2015-08-18 — End: 2015-08-22

## 2015-08-18 MED ORDER — ACETAMINOPHEN 650 MG RE SUPP: 650.0000 mg | Freq: Four times a day (QID) | RECTAL | Status: DC | PRN

## 2015-08-18 MED ORDER — IPRATROPIUM BROMIDE 0.02 % IN SOLN: 0.5000 mg | RESPIRATORY_TRACT | Status: DC

## 2015-08-18 MED ORDER — DULOXETINE HCL 60 MG PO CPEP
60.0000 mg | ORAL_CAPSULE | Freq: Every day | ORAL | Status: DC
  Administered 2015-08-19 – 2015-08-22 (×4): 60 mg via ORAL
  Filled 2015-08-18 (×4): qty 1

## 2015-08-18 NOTE — Progress Notes (Signed)
ANTIBIOTIC CONSULT NOTE - INITIAL  Pharmacy Consult for Renal Adjustment antibiotics Indication: pneumonia  Allergies  Allergen Reactions  . Iohexol Hives     Code: HIVES, Desc: PT STATES HE BROKE OUT IN HIVES AND RASH AFTER CT NECK EARLY SEPT 2011; NO RESP PROBLEMS; NEEDS PRE-MEDS; MKS, Onset Date: [id number]   . Ivp Dye [Iodinated Diagnostic Agents] Hives    Patient Measurements: Height: 5\' 5"  (165.1 cm) Weight: 153 lb (69.4 kg) IBW/kg (Calculated) : 61.5 Adjusted Body Weight:   Vital Signs: Temp: 97 F (36.1 C) (05/05 2159) Temp Source: Oral (05/05 2159) BP: 112/81 mmHg (05/05 2159) Pulse Rate: 104 (05/05 2159) Intake/Output from previous day:   Intake/Output from this shift:    Labs:  Recent Labs  08/18/15 1803  WBC 25.0*  HGB 12.4*  PLT 383  CREATININE 0.90   Estimated Creatinine Clearance: 63.6 mL/min (by C-G formula based on Cr of 0.9). No results for input(s): VANCOTROUGH, VANCOPEAK, VANCORANDOM, GENTTROUGH, GENTPEAK, GENTRANDOM, TOBRATROUGH, TOBRAPEAK, TOBRARND, AMIKACINPEAK, AMIKACINTROU, AMIKACIN in the last 72 hours.   Microbiology: No results found for this or any previous visit (from the past 720 hour(s)).  Medical History: Past Medical History  Diagnosis Date  . COPD (chronic obstructive pulmonary disease) ([place])   . Asthma   . Tobacco abuse 09/29/2011  . Hypercholesteremia   . GERD (gastroesophageal reflux disease)   . Headache(784.0)   . BPH (benign prostatic hyperplasia) 10/01/2011    Per cystoscopy  . 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  . Pulmonary nodule 09/2011    Stable appearance  . Chronic back pain   . Chronic anxiety   . Hyperlipidemia 03/13/2012  . Anxiety 03/13/2012  . On home O2     2L N/C continuous  . Emphysema lung ([place])   . Depression   . History of stomach ulcers     Medications:  Scheduled:  . [START ON 08/19/2015] aspirin EC  81 mg Oral q morning - 10a  . azithromycin  500 mg Intravenous  Q24H  . azithromycin  500 mg Intravenous Q24H  . B-12  1 tablet Oral Daily  . budesonide (PULMICORT) nebulizer solution  0.25 mg Nebulization BID  . cefTRIAXone (ROCEPHIN)  IV  1 g Intravenous Q24H  . docusate sodium  100 mg Oral BID  . DULoxetine  60 mg Oral Daily  . enoxaparin (LOVENOX) injection  40 mg Subcutaneous Q24H  . [START ON 08/19/2015] ipratropium-albuterol  3 mL Nebulization Q4H  . loratadine  10 mg Oral Daily  . methylPREDNISolone (SOLU-MEDROL) injection  40 mg Intravenous Q12H  . multivitamin with minerals  1 tablet Oral Daily  . pantoprazole  40 mg Oral Daily  . [START ON 08/19/2015] simvastatin  40 mg Oral q morning - 10a   Assessment: 75 yo male admitted from ED for CAP Azithromycin 500 mg IV every 24 hours and Ceftriaxone 1 GM IV every 24 hours started.  Goal of Therapy:  Eradicate infection  Plan:  Continue Azithromycin and Ceftriaxone as ordered, no renal adjustment necessary F/U oral therapy when appropriate F/U any additional antibiotics for renal adustment  Abner Greenspan, Batu Cassin Bennett 08/18/2015,10:14 PM

## 2015-08-18 NOTE — H&P (Signed)
History and Physical    [name] [name] [id number] DOB: Jun 11, 1940 DOA: 08/18/2015  Referring MD/NP/PA: Dr. Lacinda Axon. PCP: Kenn File, MD  Outpatient Specialists: None. Patient coming from: Home.  Chief Complaint: Shortness of breath.  HPI: [name] [name] is a 75 y.o. male with medical history significant of COPD, hyperlipidemia presents to the ER because of shortness of breath. Patient has been having shortness of breath with productive cough over the last 2-3 days. Denies any associated chest pain or fever or chills. In the ER patient was found be wheezing and hypoxic and was placed on BiPAP. Chest x-ray shows possible pneumonia for which patient had been placed on empiric antibiotics. In addition patient also was given nebulizer treatment and admitted for acute respiratory failure secondary to COPD and pneumonia.   ED Course: Patient was placed on BiPAP and started on antibiotics and nebulizer.  Review of Systems: As per HPI otherwise 10 point review of systems negative.    Past Medical History  Diagnosis Date  . COPD (chronic obstructive pulmonary disease) ([place])   . Asthma   . Tobacco abuse 09/29/2011  . Hypercholesteremia   . GERD (gastroesophageal reflux disease)   . Headache(784.0)   . BPH (benign prostatic hyperplasia) 10/01/2011    Per cystoscopy  . 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  . Pulmonary nodule 09/2011    Stable appearance  . Chronic back pain   . Chronic anxiety   . Hyperlipidemia 03/13/2012  . Anxiety 03/13/2012  . On home O2     2L N/C continuous  . Emphysema lung ([place])   . Depression   . History of stomach ulcers     Past Surgical History  Procedure Laterality Date  . Cystoscopy  09/30/2011    Procedure: CYSTOSCOPY FLEXIBLE;  Surgeon: Marissa Nestle, MD;  Location: [facility];  Service: Urology;  Laterality: N/A;  . Transurethral resection of bladder tumor  10/01/2011    Procedure: TRANSURETHRAL RESECTION OF BLADDER TUMOR  (TURBT);  Surgeon: Marissa Nestle, MD;  Location: [facility];  Service: Urology;  Laterality: N/A;  . Cardiac catheterization  2003    [facility]  . Fracture surgery      R ring finger, pin placed     reports that he has been smoking Cigarettes.  He has a 87 pack-year smoking history. He does not have any smokeless tobacco history on file. He reports that he does not drink alcohol or use illicit drugs.  Allergies  Allergen Reactions  . Iohexol Hives     Code: HIVES, Desc: PT STATES HE BROKE OUT IN HIVES AND RASH AFTER CT NECK EARLY SEPT 2011; NO RESP PROBLEMS; NEEDS PRE-MEDS; MKS, Onset Date: [id number]   . Ivp Dye [Iodinated Diagnostic Agents] Hives    Family History  Problem Relation Age of Onset  . Emphysema Brother     was not close to family; does not truly know their medical problems.  . Emphysema Brother   . Emphysema Brother   . Cancer Neg Hx     Prior to Admission medications   Medication Sig Start Date End Date Taking? Authorizing Provider  albuterol (PROVENTIL) (2.5 MG/3ML) 0.083% nebulizer solution Take 2.5 mg by nebulization every 4 (four) hours as needed. For shortness of breath    Historical Provider, MD  albuterol-ipratropium (COMBIVENT) 18-103 MCG/ACT inhaler Inhale 2 puffs into the lungs 4 (four) times daily. 03/30/13   Rexene Alberts, MD  ALPRAZolam Duanne Moron) 1 MG tablet Take 0.5-1  mg by mouth 2 (two) times daily as needed for anxiety. anxiety 02/20/13   Domenic Polite, MD  aspirin EC 81 MG tablet Take 81 mg by mouth every morning.  10/02/11   Rexene Alberts, MD  budesonide-formoterol [facility]) 160-4.5 MCG/ACT inhaler Inhale 2 puffs into the lungs 2 (two) times daily. 03/30/13   Rexene Alberts, MD  Cholecalciferol (VITAMIN D3) 1000 UNITS CAPS Take 1 capsule by mouth every morning.     Historical Provider, MD  COMBIVENT RESPIMAT 20-100 MCG/ACT AERS respimat  05/17/15   Historical Provider, MD  Cyanocobalamin (B-12) 2500 MCG TABS Take 1 tablet by mouth daily.    Historical  Provider, MD  DALIRESP 500 MCG TABS tablet Take 500 mcg by mouth daily. 01/26/14   Historical Provider, MD  docusate sodium (COLACE) 100 MG capsule Take 100 mg by mouth 2 (two) times daily.    Historical Provider, MD  DULoxetine (CYMBALTA) 60 MG capsule Take 1 capsule (60 mg total) by mouth daily. 05/18/15   Timmothy Euler, MD  esomeprazole (NEXIUM) 40 MG capsule Take 40 mg by mouth every morning.    Historical Provider, MD  Garlic [id number] MG CAPS Take 1 capsule by mouth every morning.     Historical Provider, MD  loratadine (CLARITIN) 10 MG tablet Take 10 mg by mouth daily.    Historical Provider, MD  Multiple Vitamin (MULTIVITAMIN WITH MINERALS) TABS tablet Take 1 tablet by mouth daily.    Historical Provider, MD  NITROSTAT 0.4 MG SL tablet Place 1 tablet under the tongue as needed for chest pain.  02/22/13   Historical Provider, MD  simvastatin (ZOCOR) 40 MG tablet Take 40 mg by mouth every morning.     Historical Provider, MD  sodium chloride (OCEAN) 0.65 % SOLN nasal spray Place 1 spray into both nostrils as needed for congestion. 05/08/14   Samuella Cota, MD  VENTOLIN HFA 108 [phone] Base) MCG/ACT inhaler  05/17/15   Historical Provider, MD    Physical Exam: Filed Vitals:   08/18/15 1930 08/18/15 2000 08/18/15 2030 08/18/15 2109  BP: 113/83 104/59 95/69 110/77  Pulse: 114     Temp:      TempSrc:      Resp: 45 37 45 44  Height:      Weight:      SpO2: 93%         Constitutional: Appears normal. Filed Vitals:   08/18/15 1930 08/18/15 2000 08/18/15 2030 08/18/15 2109  BP: 113/83 104/59 95/69 110/77  Pulse: 114     Temp:      TempSrc:      Resp: 45 37 45 44  Height:      Weight:      SpO2: 93%      Eyes: Anicteric no pallor. ENMT: No discharge from the ears eyes nose and mouth. Neck: No JVD appreciated no mass felt. Respiratory: Mild expiratory wheeze or crepitations. Cardiovascular: S1-S2 heard. Abdomen: Soft nontender bowel sounds present. Musculoskeletal: No  edema. Skin: No rash. Neurologic: Alert awake oriented to time place and person. Moves all extremities. Psychiatric: Appears normal.   Labs on Admission: I have personally reviewed following labs and imaging studies  CBC:  Recent Labs Lab 08/18/15 1803  WBC 25.0*  NEUTROABS 18.9*  HGB 12.4*  HCT 37.4*  MCV 92.8  PLT [id number]   Basic Metabolic Panel:  Recent Labs Lab 08/18/15 1803  NA 134*  K 4.8  CL 93*  CO2 31  GLUCOSE 134*  BUN 12  CREATININE 0.90  CALCIUM 9.7   GFR: Estimated Creatinine Clearance: 63.6 mL/min (by C-G formula based on Cr of 0.9). Liver Function Tests: No results for input(s): AST, ALT, ALKPHOS, BILITOT, PROT, ALBUMIN in the last 168 hours. No results for input(s): LIPASE, AMYLASE in the last 168 hours. No results for input(s): AMMONIA in the last 168 hours. Coagulation Profile: No results for input(s): INR, PROTIME in the last 168 hours. Cardiac Enzymes:  Recent Labs Lab 08/18/15 1803  TROPONINI <0.03   BNP (last 3 results) No results for input(s): PROBNP in the last 8760 hours. HbA1C: No results for input(s): HGBA1C in the last 72 hours. CBG: No results for input(s): GLUCAP in the last 168 hours. Lipid Profile: No results for input(s): CHOL, HDL, LDLCALC, TRIG, CHOLHDL, LDLDIRECT in the last 72 hours. Thyroid Function Tests: No results for input(s): TSH, T4TOTAL, FREET4, T3FREE, THYROIDAB in the last 72 hours. Anemia Panel: No results for input(s): VITAMINB12, FOLATE, FERRITIN, TIBC, IRON, RETICCTPCT in the last 72 hours. Urine analysis:    Component Value Date/Time   COLORURINE ORANGE* 05/07/2015 1248   APPEARANCEUR HAZY* 05/07/2015 1248   LABSPEC 1.010 05/07/2015 1248   PHURINE 5.5 05/07/2015 1248   GLUCOSEU NEGATIVE 05/07/2015 1248   HGBUR LARGE* 05/07/2015 1248   BILIRUBINUR SMALL* 05/07/2015 1248   KETONESUR NEGATIVE 05/07/2015 1248   PROTEINUR 30* 05/07/2015 1248   UROBILINOGEN 0.2 05/04/2014 0956   NITRITE NEGATIVE  05/07/2015 1248   LEUKOCYTESUR TRACE* 05/07/2015 1248   Sepsis Labs: @LABRCNTIP (procalcitonin:4,lacticidven:4) )No results found for this or any previous visit (from the past 240 hour(s)).   Radiological Exams on Admission: Dg Chest Port 1 View  08/18/2015  CLINICAL DATA:  Shortness of breath and cough for 3 weeks. EXAM: PORTABLE CHEST 1 VIEW COMPARISON:  05/07/2015 and prior exams FINDINGS: The cardiomediastinal silhouette is unremarkable. Bilateral lower lung opacities are noted and may represent atelectasis or airspace disease. There is no evidence of pleural effusion or pneumothorax. No acute bony abnormalities are identified. IMPRESSION: Bilateral lower lung opacities which may represent atelectasis or airspace disease/pneumonia. Electronically Signed   By: Margarette Canada M.D.   On: 08/18/2015 18:20    EKG: Independently reviewed. Sinus tachycardia.  Assessment/Plan Principal Problem:   Acute respiratory failure with hypoxia and hypercarbia (HCC) Active Problems:   Tobacco abuse   CAP (community acquired pneumonia)   HLD (hyperlipidemia)   Chronic anemia   Acute respiratory failure (HCC)   COPD exacerbation ([place])    #1. Acute respiratory failure with hypoxia and hypercarbia secondary to COPD exacerbation and pneumonia - patient has been placed on IV steroids and nebulizer Pulmicort and antibiotics. Patient is on ceftriaxone and Zithromax for pneumonia. Check urine strep antigen. Patient is physically on BiPAP which will need to be slowly weaned off. #2. Tobacco abuse - tobacco cessation counseling requested. #3. Chronic anemia - follow CBC closely. #4. Hyperlipidemia on statins.   DVT prophylaxis: Lovenox. Code Status: Full code.  Family Communication: No family at the bedside.  Disposition Plan: Home.  Consults called: None.  Admission status: Inpatient. 2 days.    Rise Patience MD [facility] Pager [phone].  If 7PM-7AM, please contact  night-coverage [email or web address] Password [email or web address]  08/18/2015, 9:25 PM

## 2015-08-18 NOTE — ED Notes (Addendum)
EMS called to PCP office for sob.  Pt c/o sob since this morning.  EMS gave total of 7.5mg  albuteral and 0.5mg  atrovent,  Gave 125mg  solumedrol.  Dr. Warrick Parisian also notified EMS that pt had 120 ativan 1 mg tablets prescribed 5 days ago and only has 20 in his bottle now.

## 2015-08-18 NOTE — Telephone Encounter (Signed)
Unable to leave voice mails. Several attempts to call and inform patient of refusal to refill xanax, per provider, due to refills acquired from another provider.

## 2015-08-18 NOTE — ED Notes (Signed)
Report given to [facility] in ICU, all questions answered.

## 2015-08-18 NOTE — ED Notes (Signed)
Report given to [facility], ICU nurse, all questions answered.

## 2015-08-18 NOTE — ED Notes (Signed)
Pt transferred to ICU room 11 by myself, Delrae Alfred, ED Tech and RT. Care handed over to [facility], ICU nurse. All questions answered

## 2015-08-18 NOTE — Progress Notes (Signed)
BP 110/70 mmHg  Pulse 76  Temp(Src) 97 F (36.1 C) (Oral)  Ht   Wt   SpO2 85%   Subjective:    Patient ID: [name] [name], male    DOB: 1940/11/19, 75 y.o.   MRN: [id number]  HPI: [name] [name] is a 75 y.o. male presenting on 08/18/2015 for Shortness of Breath and Fall   HPI Shortness of breath and falling Patient comes in today because he has been having shortness of breath and been falling over the past couple days. Currently he is on his home oxygen tank here and he has it set at 5 L which she normally runs about 3 and he is saturating at 84-86%. He also has quicker respiratory rate in the 30s to 40s per minute. The patient was given a prescription of 120 benzodiazepines by another provider and only has 20-30 left and there is some concern that he may have overdone them. There is also some concern per him that somebody stole them problem although he can't pinpoint who. He denies any fevers or chills. He has been having a cough that is productive of yellow-green sputum. He also is being treated currently for bladder cancer with radiation which has lowered his immune system and decreased his appetite. He has fallen and been very unsteady on his feet over the past couple days and is followed at least 6 times without any major fractures but just concerned because of how unsteady and dizzy he is.  Relevant past medical, surgical, family and social history reviewed and updated as indicated. Interim medical history since our last visit reviewed. Allergies and medications reviewed and updated.  Review of Systems  Constitutional: Negative for fever and chills.  HENT: Positive for congestion, postnasal drip, rhinorrhea, sinus pressure, sneezing and sore throat. Negative for ear discharge, ear pain and voice change.   Eyes: Negative for pain, discharge, redness and visual disturbance.  Respiratory: Positive for cough, chest tightness and shortness of breath. Negative for wheezing.     Cardiovascular: Negative for chest pain, palpitations and leg swelling.  Gastrointestinal: Negative for abdominal pain, diarrhea and constipation.  Genitourinary: Negative for difficulty urinating.  Musculoskeletal: Negative for back pain and gait problem.  Skin: Negative for rash.  Neurological: Negative for syncope, light-headedness and headaches.  All other systems reviewed and are negative.   Per HPI unless specifically indicated above     Medication List       This list is accurate as of: 08/18/15  4:07 PM.  Always use your most recent med list.               albuterol (2.5 MG/3ML) 0.083% nebulizer solution  Commonly known as:  PROVENTIL  Take 2.5 mg by nebulization every 4 (four) hours as needed. For shortness of breath     VENTOLIN HFA 108 (90 Base) MCG/ACT inhaler  Generic drug:  albuterol     albuterol-ipratropium 18-103 MCG/ACT inhaler  Commonly known as:  COMBIVENT  Inhale 2 puffs into the lungs 4 (four) times daily.     COMBIVENT RESPIMAT 20-100 MCG/ACT Aers respimat  Generic drug:  Ipratropium-Albuterol     ALPRAZolam 1 MG tablet  Commonly known as:  XANAX  Take 0.5-1 mg by mouth 2 (two) times daily as needed for anxiety. anxiety     aspirin EC 81 MG tablet  Take 81 mg by mouth every morning.     B-12 2500 MCG Tabs  Take 1 tablet by mouth daily.  budesonide-formoterol 160-4.5 MCG/ACT inhaler  Commonly known as:  SYMBICORT  Inhale 2 puffs into the lungs 2 (two) times daily.     DALIRESP 500 MCG Tabs tablet  Generic drug:  roflumilast  Take 500 mcg by mouth daily.     docusate sodium 100 MG capsule  Commonly known as:  COLACE  Take 100 mg by mouth 2 (two) times daily.     DULoxetine 60 MG capsule  Commonly known as:  CYMBALTA  Take 1 capsule (60 mg total) by mouth daily.     esomeprazole 40 MG capsule  Commonly known as:  NEXIUM  Take 40 mg by mouth every morning.     Garlic [id number] MG Caps  Take 1 capsule by mouth every morning.      loratadine 10 MG tablet  Commonly known as:  CLARITIN  Take 10 mg by mouth daily.     multivitamin with minerals Tabs tablet  Take 1 tablet by mouth daily.     NITROSTAT 0.4 MG SL tablet  Generic drug:  nitroGLYCERIN  Place 1 tablet under the tongue as needed for chest pain.     simvastatin 40 MG tablet  Commonly known as:  ZOCOR  Take 40 mg by mouth every morning.     sodium chloride 0.65 % Soln nasal spray  Commonly known as:  OCEAN  Place 1 spray into both nostrils as needed for congestion.     Vitamin D3 1000 units Caps  Take 1 capsule by mouth every morning.           Objective:    BP 110/70 mmHg  Pulse 76  Temp(Src) 97 F (36.1 C) (Oral)  Ht   Wt   SpO2 85%  Wt Readings from Last 3 Encounters:  08/01/15 159 lb 9.6 oz (72.394 kg)  07/10/15 158 lb 9.6 oz (71.94 kg)  05/18/15 159 lb 3.2 oz (72.213 kg)    Physical Exam  Constitutional: He is oriented to person, place, and time. He appears well-developed and well-nourished. No distress.  Patient in wheelchair  HENT:  Right Ear: Tympanic membrane, external ear and ear canal normal.  Left Ear: Tympanic membrane, external ear and ear canal normal.  Nose: Mucosal edema and rhinorrhea present. No sinus tenderness. No epistaxis. Right sinus exhibits maxillary sinus tenderness. Right sinus exhibits no frontal sinus tenderness. Left sinus exhibits maxillary sinus tenderness. Left sinus exhibits no frontal sinus tenderness.  Mouth/Throat: Uvula is midline and mucous membranes are normal. Posterior oropharyngeal edema and posterior oropharyngeal erythema present. No oropharyngeal exudate or tonsillar abscesses.  Eyes: Conjunctivae and EOM are normal. Pupils are equal, round, and reactive to light. Right eye exhibits no discharge. No scleral icterus.  Neck: Neck supple. No thyromegaly present.  Cardiovascular: Normal rate, regular rhythm, normal heart sounds and intact distal pulses.   No murmur heard. Pulmonary/Chest:  Effort normal and breath sounds normal. No respiratory distress. He has no wheezes. He has no rales.  Musculoskeletal: Normal range of motion. He exhibits no edema.  Lymphadenopathy:    He has no cervical adenopathy.  Neurological: He is alert and oriented to person, place, and time. Coordination normal.  Skin: Skin is warm and dry. No rash noted. He is not diaphoretic.  Psychiatric: He has a normal mood and affect. His behavior is normal.  Nursing note and vitals reviewed.     Assessment & Plan:   Problem List Items Addressed This Visit    None    Visit Diagnoses  Respiratory distress    -  Primary    sats 85%, concern for possible OD of benzos with COPD. send to ED        Follow up plan: Return in about 1 week (around 08/25/2015), or if symptoms worsen or fail to improve, for f/u Dr Wendi Snipes, breathing.  Counseling provided for all of the vaccine components No orders of the defined types were placed in this encounter.    Caryl Pina, MD [facility] 08/18/2015, 4:07 PM

## 2015-08-18 NOTE — ED Provider Notes (Signed)
CSN: [id number]     Arrival date & time 08/18/15  1725 History   First MD Initiated Contact with Patient 08/18/15 1730     Chief Complaint  Patient presents with  . Shortness of Breath     (Consider location/radiation/quality/duration/timing/severity/associated sxs/prior Treatment) HPI.Marland KitchenMarland KitchenMarland KitchenLevel V caveat for urgent need for intervention. Patient with known severe COPD on 4-5 L of nasal oxygen daily presents with worsening dyspnea, wheezing, coughing over the past several days. He was transported by EMS who gave him Albuterol/Atrovent nebulizer treatment and IV steroids. He lives at home.  Past Medical History  Diagnosis Date  . COPD (chronic obstructive pulmonary disease) ([place])   . Asthma   . Tobacco abuse 09/29/2011  . Hypercholesteremia   . GERD (gastroesophageal reflux disease)   . Headache(784.0)   . BPH (benign prostatic hyperplasia) 10/01/2011    Per cystoscopy  . 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  . Pulmonary nodule 09/2011    Stable appearance  . Chronic back pain   . Chronic anxiety   . Hyperlipidemia 03/13/2012  . Anxiety 03/13/2012  . On home O2     2L N/C continuous  . Emphysema lung ([place])   . Depression   . History of stomach ulcers    Past Surgical History  Procedure Laterality Date  . Cystoscopy  09/30/2011    Procedure: CYSTOSCOPY FLEXIBLE;  Surgeon: Marissa Nestle, MD;  Location: [facility];  Service: Urology;  Laterality: N/A;  . Transurethral resection of bladder tumor  10/01/2011    Procedure: TRANSURETHRAL RESECTION OF BLADDER TUMOR (TURBT);  Surgeon: Marissa Nestle, MD;  Location: [facility];  Service: Urology;  Laterality: N/A;  . Cardiac catheterization  2003    [facility]  . Fracture surgery      R ring finger, pin placed   Family History  Problem Relation Age of Onset  . Emphysema Brother     was not close to family; does not truly know their medical problems.  . Emphysema Brother   . Emphysema Brother   . Cancer Neg Hx     Social History  Substance Use Topics  . Smoking status: Current Every Day Smoker -- 1.50 packs/day for 58 years    Types: Cigarettes    Last Attempt to Quit: 02/17/2014  . Smokeless tobacco: None  . Alcohol Use: No    Review of Systems  Reason unable to perform ROS: urgent need for intervention.      Allergies  Iohexol and Ivp dye  Home Medications   Prior to Admission medications   Medication Sig Start Date End Date Taking? Authorizing Provider  albuterol (PROVENTIL) (2.5 MG/3ML) 0.083% nebulizer solution Take 2.5 mg by nebulization every 4 (four) hours as needed. For shortness of breath    Historical Provider, MD  albuterol-ipratropium (COMBIVENT) 18-103 MCG/ACT inhaler Inhale 2 puffs into the lungs 4 (four) times daily. 03/30/13   Rexene Alberts, MD  ALPRAZolam Duanne Moron) 1 MG tablet Take 0.5-1 mg by mouth 2 (two) times daily as needed for anxiety. anxiety 02/20/13   Domenic Polite, MD  aspirin EC 81 MG tablet Take 81 mg by mouth every morning.  10/02/11   Rexene Alberts, MD  budesonide-formoterol [facility]) 160-4.5 MCG/ACT inhaler Inhale 2 puffs into the lungs 2 (two) times daily. 03/30/13   Rexene Alberts, MD  Cholecalciferol (VITAMIN D3) 1000 UNITS CAPS Take 1 capsule by mouth every morning.     Historical Provider, MD  COMBIVENT RESPIMAT 20-100 MCG/ACT AERS respimat  05/17/15   Historical Provider, MD  Cyanocobalamin (B-12) 2500 MCG TABS Take 1 tablet by mouth daily.    Historical Provider, MD  DALIRESP 500 MCG TABS tablet Take 500 mcg by mouth daily. 01/26/14   Historical Provider, MD  docusate sodium (COLACE) 100 MG capsule Take 100 mg by mouth 2 (two) times daily.    Historical Provider, MD  DULoxetine (CYMBALTA) 60 MG capsule Take 1 capsule (60 mg total) by mouth daily. 05/18/15   Timmothy Euler, MD  esomeprazole (NEXIUM) 40 MG capsule Take 40 mg by mouth every morning.    Historical Provider, MD  Garlic [id number] MG CAPS Take 1 capsule by mouth every morning.     Historical  Provider, MD  loratadine (CLARITIN) 10 MG tablet Take 10 mg by mouth daily.    Historical Provider, MD  Multiple Vitamin (MULTIVITAMIN WITH MINERALS) TABS tablet Take 1 tablet by mouth daily.    Historical Provider, MD  NITROSTAT 0.4 MG SL tablet Place 1 tablet under the tongue as needed for chest pain.  02/22/13   Historical Provider, MD  simvastatin (ZOCOR) 40 MG tablet Take 40 mg by mouth every morning.     Historical Provider, MD  sodium chloride (OCEAN) 0.65 % SOLN nasal spray Place 1 spray into both nostrils as needed for congestion. 05/08/14   Samuella Cota, MD  VENTOLIN HFA [phone] Base) MCG/ACT inhaler  05/17/15   Historical Provider, MD   BP 119/79 mmHg  Pulse 114  Temp(Src) 98 F (36.7 C) (Oral)  Resp 43  Ht 5\' 5"  (1.651 m)  Wt 159 lb (72.122 kg)  BMI 26.46 kg/m2  SpO2 96% Physical Exam  Constitutional: He is oriented to person, place, and time.  Tachypneic  HENT:  Head: Normocephalic and atraumatic.  Eyes: Conjunctivae and EOM are normal. Pupils are equal, round, and reactive to light.  Neck: Normal range of motion. Neck supple.  Cardiovascular: Normal rate and regular rhythm.   Pulmonary/Chest:  Not moving air well, bilateral expiratory wheezing.  Abdominal: Soft. Bowel sounds are normal.  Musculoskeletal: Normal range of motion.  Neurological: He is alert and oriented to person, place, and time.  Skin: Skin is warm and dry.  Psychiatric: He has a normal mood and affect. His behavior is normal.  Nursing note and vitals reviewed.   ED Course  Procedures (including critical care time) Labs Review Labs Reviewed  CBC WITH DIFFERENTIAL/PLATELET - Abnormal; Notable for the following:    WBC 25.0 (*)    RBC 4.03 (*)    Hemoglobin 12.4 (*)    HCT 37.4 (*)    Neutro Abs 18.9 (*)    Monocytes Absolute 2.1 (*)    All other components within normal limits  BASIC METABOLIC PANEL - Abnormal; Notable for the following:    Sodium 134 (*)    Chloride 93 (*)    Glucose,  Bld 134 (*)    All other components within normal limits  TROPONIN I  BLOOD GAS, ARTERIAL    Imaging Review Dg Chest Port 1 View  08/18/2015  CLINICAL DATA:  Shortness of breath and cough for 3 weeks. EXAM: PORTABLE CHEST 1 VIEW COMPARISON:  05/07/2015 and prior exams FINDINGS: The cardiomediastinal silhouette is unremarkable. Bilateral lower lung opacities are noted and may represent atelectasis or airspace disease. There is no evidence of pleural effusion or pneumothorax. No acute bony abnormalities are identified. IMPRESSION: Bilateral lower lung opacities which may represent atelectasis or airspace disease/pneumonia. Electronically Signed  By: Margarette Canada M.D.   On: 08/18/2015 18:20   I have personally reviewed and evaluated these images and lab results as part of my medical decision-making.   EKG Interpretation   Date/Time:  Friday Aug 18 2015 17:33:28 EDT Ventricular Rate:  113 PR Interval:  171 QRS Duration: 73 QT Interval:  319 QTC Calculation: 437 R Axis:   102 Text Interpretation:  Sinus tachycardia Right axis deviation Confirmed by  Dynastee Brummell  MD, Haleemah Buckalew ([id number]) on 08/18/2015 5:43:26 PM     CRITICAL CARE Performed by: Nat Christen Total critical care time: 30 minutes Critical care time was exclusive of separately billable procedures and treating other patients. Critical care was necessary to treat or prevent imminent or life-threatening deterioration. Critical care was time spent personally by me on the following activities: development of treatment plan with patient and/or surrogate as well as nursing, discussions with consultants, evaluation of patient's response to treatment, examination of patient, obtaining history from patient or surrogate, ordering and performing treatments and interventions, ordering and review of laboratory studies, ordering and review of radiographic studies, pulse oximetry and re-evaluation of patient's condition. MDM   Final diagnoses:  CAP (community  acquired pneumonia)  Pulmonary emphysema, unspecified emphysema type ([place])    Patient immediately put on BiPAP. Continuous albuterol nebulizer ordered. Patient does not want to be intubated. Chest x-ray shows bilateral lower lung opacities most likely pneumonia. Will start IV Rocephin and IV Zithromax for community-acquired pneumonia. Admit to general medicine.    Nat Christen, MD 08/18/15 1919

## 2015-08-19 DIAGNOSIS — J9601 Acute respiratory failure with hypoxia: Secondary | ICD-10-CM

## 2015-08-19 DIAGNOSIS — J9602 Acute respiratory failure with hypercapnia: Secondary | ICD-10-CM

## 2015-08-19 LAB — CBC WITH DIFFERENTIAL/PLATELET
Basophils Absolute: 0 10*3/uL (ref 0.0–0.1)
Basophils Relative: 0 %
EOS PCT: 0 %
Eosinophils Absolute: 0 10*3/uL (ref 0.0–0.7)
HCT: 35.5 % — ABNORMAL LOW (ref 39.0–52.0)
Hemoglobin: 11.5 g/dL — ABNORMAL LOW (ref 13.0–17.0)
LYMPHS ABS: 1.6 10*3/uL (ref 0.7–4.0)
Lymphocytes Relative: 9 %
MCH: 29.9 pg (ref 26.0–34.0)
MCHC: 32.4 g/dL (ref 30.0–36.0)
MCV: 92.4 fL (ref 78.0–100.0)
Monocytes Absolute: 0.3 10*3/uL (ref 0.1–1.0)
Monocytes Relative: 2 %
Neutro Abs: 16 10*3/uL — ABNORMAL HIGH (ref 1.7–7.7)
Neutrophils Relative %: 89 %
PLATELETS: 355 10*3/uL (ref 150–400)
RBC: 3.84 MIL/uL — AB (ref 4.22–5.81)
RDW: 13.7 % (ref 11.5–15.5)
WBC: 17.8 10*3/uL — AB (ref 4.0–10.5)

## 2015-08-19 LAB — STREP PNEUMONIAE URINARY ANTIGEN: STREP PNEUMO URINARY ANTIGEN: NEGATIVE

## 2015-08-19 LAB — COMPREHENSIVE METABOLIC PANEL
ALT: 20 U/L (ref 17–63)
ANION GAP: 10 (ref 5–15)
AST: 16 U/L (ref 15–41)
Albumin: 3.3 g/dL — ABNORMAL LOW (ref 3.5–5.0)
Alkaline Phosphatase: 89 U/L (ref 38–126)
BUN: 17 mg/dL (ref 6–20)
CHLORIDE: 91 mmol/L — AB (ref 101–111)
CO2: 31 mmol/L (ref 22–32)
CREATININE: 0.73 mg/dL (ref 0.61–1.24)
Calcium: 9.7 mg/dL (ref 8.9–10.3)
GFR calc non Af Amer: >60 mL/min (ref >60)
Glucose, Bld: 151 mg/dL — ABNORMAL HIGH (ref 65–99)
POTASSIUM: 5.4 mmol/L — AB (ref 3.5–5.1)
SODIUM: 132 mmol/L — AB (ref 135–145)
Total Bilirubin: 0.4 mg/dL (ref 0.3–1.2)
Total Protein: 7.6 g/dL (ref 6.5–8.1)

## 2015-08-19 LAB — MRSA PCR SCREENING: MRSA BY PCR: POSITIVE — AB

## 2015-08-19 MED ORDER — CHLORHEXIDINE GLUCONATE CLOTH 2 % EX PADS
6.0000 | MEDICATED_PAD | Freq: Every day | CUTANEOUS | Status: DC
  Administered 2015-08-19 – 2015-08-21 (×3): 6 via TOPICAL

## 2015-08-19 MED ORDER — GUAIFENESIN-DM 100-10 MG/5ML PO SYRP
5.0000 mL | ORAL_SOLUTION | ORAL | Status: DC | PRN
  Administered 2015-08-19 – 2015-08-20 (×3): 5 mL via ORAL
  Filled 2015-08-19 (×3): qty 5

## 2015-08-19 MED ORDER — MUPIROCIN 2 % EX OINT
1.0000 "application " | TOPICAL_OINTMENT | Freq: Two times a day (BID) | CUTANEOUS | Status: DC
  Administered 2015-08-19 – 2015-08-22 (×7): 1 via NASAL
  Filled 2015-08-19: qty 22

## 2015-08-19 NOTE — Progress Notes (Signed)
PROGRESS NOTE    [name] [name]  [id number] DOB: January 02, 1941 DOA: 08/18/2015 PCP: Kenn File, MD     Brief Narrative:  75 year old man admitted on 5/5 with shortness of breath. Was found to have COPD exacerbation and required noninvasive positive pressure ventilation. Was admitted to the ICU. Was weaned to nasal cannula oxygen on 5/6. Has been placed on empiric antibiotics.   Assessment & Plan:   Principal Problem:   Acute respiratory failure with hypoxia and hypercarbia (HCC) Active Problems:   Tobacco abuse   CAP (community acquired pneumonia)   HLD (hyperlipidemia)   Chronic anemia   Acute respiratory failure (HCC)   COPD exacerbation (HCC)   Acute on chronic hypoxic and hypercarbic respiratory failure -Due to COPD with acute exacerbation. -Has been weaned to nasal cannula oxygen.  COPD with acute exacerbation -Still remains with very poor breath sounds and air movement. -Continue steroids at current dose without titrating today, continue nebs. -Continue. Coverage for community-acquired pneumonia.  Community-acquired pneumonia -Continue Rocephin/azithromycin. -Culture data remains pending at this time.  Tobacco abuse -Counseled on cessation   DVT prophylaxis: Lovenox Code Status: Full code Family Communication: Patient only Disposition Plan: Keep in ICU one more day in case requires to go back on BiPAP  Consultants:   None  Procedures:   None  Antimicrobials:   Rocephin  Azithromycin    Subjective: Sleeping, arouses briefly to answer questions and falls back asleep  Objective: Filed Vitals:   08/19/15 1100 08/19/15 1200 08/19/15 1253 08/19/15 1300  BP: 94/54 88/57  118/75  Pulse: 77 84  89  Temp:      TempSrc:      Resp: 28 27  21   Height:      Weight:      SpO2: 95% 95% 91% 92%    Intake/Output Summary (Last 24 hours) at 08/19/15 1516 Last data filed at 08/19/15 1300  Gross per 24 hour  Intake    850 ml  Output    500 ml    Net    350 ml   Filed Weights   08/18/15 1732 08/18/15 2159  Weight: 72.122 kg (159 lb) 69.4 kg (153 lb)    Examination:  General exam: Alert, awake, oriented x 3 Respiratory system: Poor air movement, mild bilateral crackles Cardiovascular system:RRR. No murmurs, rubs, gallops. Gastrointestinal system: Abdomen is nondistended, soft and nontender. No organomegaly or masses felt. Normal bowel sounds heard. Central nervous system: Alert and oriented. No focal neurological deficits. Extremities: No C/C/E, +pedal pulses Skin: No rashes, lesions or ulcers Psychiatry: Judgement and insight appear normal. Mood & affect appropriate.     Data Reviewed: I have personally reviewed following labs and imaging studies  CBC:  Recent Labs Lab 08/18/15 1803 08/19/15 0812  WBC 25.0* 17.8*  NEUTROABS 18.9* 16.0*  HGB 12.4* 11.5*  HCT 37.4* 35.5*  MCV 92.8 92.4  PLT 383 Q000111Q   Basic Metabolic Panel:  Recent Labs Lab 08/18/15 1803 08/19/15 0812  NA 134* 132*  K 4.8 5.4*  CL 93* 91*  CO2 31 31  GLUCOSE 134* 151*  BUN 12 17  CREATININE 0.90 0.73  CALCIUM 9.7 9.7   GFR: Estimated Creatinine Clearance: 71.5 mL/min (by C-G formula based on Cr of 0.73). Liver Function Tests:  Recent Labs Lab 08/19/15 0812  AST 16  ALT 20  ALKPHOS 89  BILITOT 0.4  PROT 7.6  ALBUMIN 3.3*   No results for input(s): LIPASE, AMYLASE in the last 168 hours. No results for  input(s): AMMONIA in the last 168 hours. Coagulation Profile: No results for input(s): INR, PROTIME in the last 168 hours. Cardiac Enzymes:  Recent Labs Lab 08/18/15 1803 08/18/15 2241  TROPONINI <0.03 <0.03   BNP (last 3 results) No results for input(s): PROBNP in the last 8760 hours. HbA1C: No results for input(s): HGBA1C in the last 72 hours. CBG: No results for input(s): GLUCAP in the last 168 hours. Lipid Profile: No results for input(s): CHOL, HDL, LDLCALC, TRIG, CHOLHDL, LDLDIRECT in the last 72  hours. Thyroid Function Tests: No results for input(s): TSH, T4TOTAL, FREET4, T3FREE, THYROIDAB in the last 72 hours. Anemia Panel: No results for input(s): VITAMINB12, FOLATE, FERRITIN, TIBC, IRON, RETICCTPCT in the last 72 hours. Urine analysis:    Component Value Date/Time   COLORURINE ORANGE* 05/07/2015 1248   APPEARANCEUR HAZY* 05/07/2015 1248   LABSPEC 1.010 05/07/2015 1248   PHURINE 5.5 05/07/2015 1248   GLUCOSEU NEGATIVE 05/07/2015 1248   HGBUR LARGE* 05/07/2015 1248   BILIRUBINUR SMALL* 05/07/2015 1248   KETONESUR NEGATIVE 05/07/2015 1248   PROTEINUR 30* 05/07/2015 1248   UROBILINOGEN 0.2 05/04/2014 0956   NITRITE NEGATIVE 05/07/2015 1248   LEUKOCYTESUR TRACE* 05/07/2015 1248   Sepsis Labs: @LABRCNTIP (procalcitonin:4,lacticidven:4)  ) Recent Results (from the past 240 hour(s))  MRSA PCR Screening     Status: Abnormal   Collection Time: 08/19/15 12:15 AM  Result Value Ref Range Status   MRSA by PCR POSITIVE (A) NEGATIVE Final    Comment:        The GeneXpert MRSA Assay (FDA approved for NASAL specimens only), is one component of a comprehensive MRSA colonization surveillance program. It is not intended to diagnose MRSA infection nor to guide or monitor treatment for MRSA infections. RESULT CALLED TO, READ BACK BY AND VERIFIED WITH:  PHILLIPS,C @ 0302 ON 08/19/15 BY [facility]          Radiology Studies: Dg Chest Port 1 View  08/18/2015  CLINICAL DATA:  Shortness of breath and cough for 3 weeks. EXAM: PORTABLE CHEST 1 VIEW COMPARISON:  05/07/2015 and prior exams FINDINGS: The cardiomediastinal silhouette is unremarkable. Bilateral lower lung opacities are noted and may represent atelectasis or airspace disease. There is no evidence of pleural effusion or pneumothorax. No acute bony abnormalities are identified. IMPRESSION: Bilateral lower lung opacities which may represent atelectasis or airspace disease/pneumonia. Electronically Signed   By: Margarette Canada M.D.    On: 08/18/2015 18:20        Scheduled Meds: . antiseptic oral rinse  7 mL Mouth Rinse q12n4p  . aspirin EC  81 mg Oral q morning - 10a  . azithromycin  500 mg Intravenous Q24H  . budesonide (PULMICORT) nebulizer solution  0.25 mg Nebulization BID  . cefTRIAXone (ROCEPHIN)  IV  1 g Intravenous Q24H  . chlorhexidine  15 mL Mouth Rinse BID  . Chlorhexidine Gluconate Cloth  6 each Topical Q0600  . docusate sodium  100 mg Oral BID  . DULoxetine  60 mg Oral Daily  . enoxaparin (LOVENOX) injection  40 mg Subcutaneous Q24H  . ipratropium-albuterol  3 mL Nebulization Q4H  . loratadine  10 mg Oral Daily  . methylPREDNISolone (SOLU-MEDROL) injection  40 mg Intravenous Q12H  . multivitamin with minerals  1 tablet Oral Daily  . mupirocin ointment  1 application Nasal BID  . pantoprazole  80 mg Oral Daily  . simvastatin  40 mg Oral q1800  . vitamin B-12  2,500 mcg Oral Daily   Continuous Infusions:  LOS: 1 day    Time spent: 25 minutes. Greater than 50% of this time was spent in direct contact with the patient coordinating care.     Lelon Frohlich, MD [facility] Pager [phone]  If 7PM-7AM, please contact night-coverage [email or web address] Password [email or web address] 08/19/2015, 3:16 PM

## 2015-08-20 MED ORDER — IPRATROPIUM-ALBUTEROL 0.5-2.5 (3) MG/3ML IN SOLN
3.0000 mL | Freq: Four times a day (QID) | RESPIRATORY_TRACT | Status: DC
  Administered 2015-08-21 – 2015-08-22 (×4): 3 mL via RESPIRATORY_TRACT
  Filled 2015-08-20 (×5): qty 3

## 2015-08-20 MED ORDER — SALINE SPRAY 0.65 % NA SOLN
1.0000 | NASAL | Status: DC | PRN
  Administered 2015-08-20: 1 via NASAL
  Filled 2015-08-20: qty 44

## 2015-08-20 NOTE — Progress Notes (Signed)
PROGRESS NOTE    [name] [name]  [id number] DOB: [birth year] DOA: 08/18/2015 PCP: Kenn File, MD     Brief Narrative:  75 year old man admitted on 5/5 with shortness of breath. Was found to have COPD exacerbation and required noninvasive positive pressure ventilation. Was admitted to the ICU. Was weaned to nasal cannula oxygen on 5/6. Has been placed on empiric antibiotics.   Assessment & Plan:   Principal Problem:   Acute respiratory failure with hypoxia and hypercarbia (HCC) Active Problems:   Tobacco abuse   CAP (community acquired pneumonia)   HLD (hyperlipidemia)   Chronic anemia   Acute respiratory failure (HCC)   COPD exacerbation (HCC)   Acute on chronic hypoxic and hypercarbic respiratory failure -Due to COPD with acute exacerbation. -Has been weaned to nasal cannula oxygen.  COPD with acute exacerbation -Still remains with very poor breath sounds and air movement, mild wheezing. -Continue steroids at current dose without titrating today, continue nebs. -Continue coverage for community-acquired pneumonia.  Community-acquired pneumonia -Continue Rocephin/azithromycin. -Culture data remains pending at this time.  Tobacco abuse -Counseled on cessation   DVT prophylaxis: Lovenox Code Status: Full code Family Communication: Patient only Disposition Plan: Transfer to floor.  Consultants:   None  Procedures:   None  Antimicrobials:   Rocephin  Azithromycin    Subjective: Feels much improved, says he is less SOB.  Objective: Filed Vitals:   08/20/15 0600 08/20/15 0700 08/20/15 0755 08/20/15 0800  BP: 112/77 110/62  119/70  Pulse: 68 65  74  Temp:    97.2 F (36.2 C)  TempSrc:    Oral  Resp: 24 33  26  Height:      Weight:      SpO2: 97% 97% 94% 97%    Intake/Output Summary (Last 24 hours) at 08/20/15 1103 Last data filed at 08/19/15 2200  Gross per 24 hour  Intake   1190 ml  Output      0 ml  Net   1190 ml   Filed  Weights   08/18/15 1732 08/18/15 2159 08/20/15 0500  Weight: 72.122 kg (159 lb) 69.4 kg (153 lb) 69.9 kg (154 lb 1.6 oz)    Examination:  General exam: Alert, awake, oriented x 3 Respiratory system: Poor air movement, mild bilateral wheezes Cardiovascular system:RRR. No murmurs, rubs, gallops. Gastrointestinal system: Abdomen is nondistended, soft and nontender. No organomegaly or masses felt. Normal bowel sounds heard. Central nervous system: Alert and oriented. No focal neurological deficits. Extremities: No C/C/E, +pedal pulses Skin: No rashes, lesions or ulcers Psychiatry: Judgement and insight appear normal. Mood & affect appropriate.     Data Reviewed: I have personally reviewed following labs and imaging studies  CBC:  Recent Labs Lab 08/18/15 1803 08/19/15 0812  WBC 25.0* 17.8*  NEUTROABS 18.9* 16.0*  HGB 12.4* 11.5*  HCT 37.4* 35.5*  MCV 92.8 92.4  PLT 383 Q000111Q   Basic Metabolic Panel:  Recent Labs Lab 08/18/15 1803 08/19/15 0812  NA 134* 132*  K 4.8 5.4*  CL 93* 91*  CO2 31 31  GLUCOSE 134* 151*  BUN 12 17  CREATININE 0.90 0.73  CALCIUM 9.7 9.7   GFR: Estimated Creatinine Clearance: 71.5 mL/min (by C-G formula based on Cr of 0.73). Liver Function Tests:  Recent Labs Lab 08/19/15 0812  AST 16  ALT 20  ALKPHOS 89  BILITOT 0.4  PROT 7.6  ALBUMIN 3.3*   No results for input(s): LIPASE, AMYLASE in the last 168 hours. No results  for input(s): AMMONIA in the last 168 hours. Coagulation Profile: No results for input(s): INR, PROTIME in the last 168 hours. Cardiac Enzymes:  Recent Labs Lab 08/18/15 1803 08/18/15 2241  TROPONINI <0.03 <0.03   BNP (last 3 results) No results for input(s): PROBNP in the last 8760 hours. HbA1C: No results for input(s): HGBA1C in the last 72 hours. CBG: No results for input(s): GLUCAP in the last 168 hours. Lipid Profile: No results for input(s): CHOL, HDL, LDLCALC, TRIG, CHOLHDL, LDLDIRECT in the last 72  hours. Thyroid Function Tests: No results for input(s): TSH, T4TOTAL, FREET4, T3FREE, THYROIDAB in the last 72 hours. Anemia Panel: No results for input(s): VITAMINB12, FOLATE, FERRITIN, TIBC, IRON, RETICCTPCT in the last 72 hours. Urine analysis:    Component Value Date/Time   COLORURINE ORANGE* 05/07/2015 1248   APPEARANCEUR HAZY* 05/07/2015 1248   LABSPEC 1.010 05/07/2015 1248   PHURINE 5.5 05/07/2015 1248   GLUCOSEU NEGATIVE 05/07/2015 1248   HGBUR LARGE* 05/07/2015 1248   BILIRUBINUR SMALL* 05/07/2015 1248   KETONESUR NEGATIVE 05/07/2015 1248   PROTEINUR 30* 05/07/2015 1248   UROBILINOGEN 0.2 05/04/2014 0956   NITRITE NEGATIVE 05/07/2015 1248   LEUKOCYTESUR TRACE* 05/07/2015 1248   Sepsis Labs: @LABRCNTIP (procalcitonin:4,lacticidven:4)  ) Recent Results (from the past 240 hour(s))  MRSA PCR Screening     Status: Abnormal   Collection Time: 08/19/15 12:15 AM  Result Value Ref Range Status   MRSA by PCR POSITIVE (A) NEGATIVE Final    Comment:        The GeneXpert MRSA Assay (FDA approved for NASAL specimens only), is one component of a comprehensive MRSA colonization surveillance program. It is not intended to diagnose MRSA infection nor to guide or monitor treatment for MRSA infections. RESULT CALLED TO, READ BACK BY AND VERIFIED WITH:  PHILLIPS,C @ 0302 ON 08/19/15 BY [facility]          Radiology Studies: Dg Chest Port 1 View  08/18/2015  CLINICAL DATA:  Shortness of breath and cough for 3 weeks. EXAM: PORTABLE CHEST 1 VIEW COMPARISON:  05/07/2015 and prior exams FINDINGS: The cardiomediastinal silhouette is unremarkable. Bilateral lower lung opacities are noted and may represent atelectasis or airspace disease. There is no evidence of pleural effusion or pneumothorax. No acute bony abnormalities are identified. IMPRESSION: Bilateral lower lung opacities which may represent atelectasis or airspace disease/pneumonia. Electronically Signed   By: Margarette Canada M.D.    On: 08/18/2015 18:20        Scheduled Meds: . aspirin EC  81 mg Oral q morning - 10a  . azithromycin  500 mg Intravenous Q24H  . budesonide (PULMICORT) nebulizer solution  0.25 mg Nebulization BID  . cefTRIAXone (ROCEPHIN)  IV  1 g Intravenous Q24H  . Chlorhexidine Gluconate Cloth  6 each Topical Q0600  . docusate sodium  100 mg Oral BID  . DULoxetine  60 mg Oral Daily  . enoxaparin (LOVENOX) injection  40 mg Subcutaneous Q24H  . ipratropium-albuterol  3 mL Nebulization Q4H  . loratadine  10 mg Oral Daily  . methylPREDNISolone (SOLU-MEDROL) injection  40 mg Intravenous Q12H  . multivitamin with minerals  1 tablet Oral Daily  . mupirocin ointment  1 application Nasal BID  . pantoprazole  80 mg Oral Daily  . simvastatin  40 mg Oral q1800  . vitamin B-12  2,500 mcg Oral Daily   Continuous Infusions:    LOS: 2 days    Time spent: 25 minutes. Greater than 50% of this time was spent  in direct contact with the patient coordinating care.     Lelon Frohlich, MD [facility] Pager [phone]  If 7PM-7AM, please contact night-coverage [email or web address] Password [email or web address] 08/20/2015, 11:03 AM

## 2015-08-20 NOTE — Progress Notes (Signed)
Called report to [facility] on [phone] and gave report and will transfer by wheelchair

## 2015-08-21 MED ORDER — AZITHROMYCIN 250 MG PO TABS: 500.0000 mg | ORAL_TABLET | Freq: Every day | ORAL | Status: DC

## 2015-08-21 NOTE — Progress Notes (Signed)
PROGRESS NOTE    [name] [name]  [id number] DOB: [birth year] DOA: 08/18/2015 PCP: Kenn File, MD     Brief Narrative:  75 year old man admitted on 5/5 with shortness of breath. Was found to have COPD exacerbation and required noninvasive positive pressure ventilation. Was admitted to the ICU. Was weaned to nasal cannula oxygen on 5/6. Has been placed on empiric antibiotics.   Assessment & Plan:   Principal Problem:   Acute respiratory failure with hypoxia and hypercarbia (HCC) Active Problems:   Tobacco abuse   CAP (community acquired pneumonia)   HLD (hyperlipidemia)   Chronic anemia   Acute respiratory failure (HCC)   COPD exacerbation (HCC)   Acute on chronic hypoxic and hypercarbic respiratory failure -Due to COPD with acute exacerbation. -Has been weaned to nasal cannula oxygen.  COPD with acute exacerbation -Still remains with very poor breath sounds and air movement, mild wheezing. -Continue steroids at current dose without titrating today, continue nebs. -Continue coverage for community-acquired pneumonia.  Community-acquired pneumonia -Continue Rocephin/azithromycin. -Culture data remains pending at this time. -Strep pneumonia urinary antigen is negative.  Tobacco abuse -Counseled on cessation   DVT prophylaxis: Lovenox Code Status: Full code Family Communication: Patient only Disposition Plan: Home when medically ready, anticipate 24-48 hours  Consultants:   None  Procedures:   None  Antimicrobials:   Rocephin  Azithromycin    Subjective: Feels much improved, says he is less SOB.  Objective: Filed Vitals:   08/21/15 0743 08/21/15 0751 08/21/15 1338 08/21/15 1506  BP:    125/69  Pulse:    74  Temp:    97.9 F (36.6 C)  TempSrc:    Oral  Resp:    20  Height:      Weight:      SpO2: 96% 98% 96% 96%   No intake or output data in the 24 hours ending 08/21/15 1539 Filed Weights   08/18/15 1732 08/18/15 2159 08/20/15 0500    Weight: 72.122 kg (159 lb) 69.4 kg (153 lb) 69.9 kg (154 lb 1.6 oz)    Examination:  General exam: Alert, awake, oriented x 3 Respiratory system: Poor air movement, mild bilateral wheezes Cardiovascular system:RRR. No murmurs, rubs, gallops. Gastrointestinal system: Abdomen is nondistended, soft and nontender. No organomegaly or masses felt. Normal bowel sounds heard. Central nervous system: Alert and oriented. No focal neurological deficits. Extremities: No C/C/E, +pedal pulses Skin: No rashes, lesions or ulcers Psychiatry: Judgement and insight appear normal. Mood & affect appropriate.     Data Reviewed: I have personally reviewed following labs and imaging studies  CBC:  Recent Labs Lab 08/18/15 1803 08/19/15 0812  WBC 25.0* 17.8*  NEUTROABS 18.9* 16.0*  HGB 12.4* 11.5*  HCT 37.4* 35.5*  MCV 92.8 92.4  PLT 383 [id number]   Basic Metabolic Panel:  Recent Labs Lab 08/18/15 1803 08/19/15 0812  NA 134* 132*  K 4.8 5.4*  CL 93* 91*  CO2 31 31  GLUCOSE 134* 151*  BUN 12 17  CREATININE 0.90 0.73  CALCIUM 9.7 9.7   GFR: Estimated Creatinine Clearance: 71.5 mL/min (by C-G formula based on Cr of 0.73). Liver Function Tests:  Recent Labs Lab 08/19/15 0812  AST 16  ALT 20  ALKPHOS 89  BILITOT 0.4  PROT 7.6  ALBUMIN 3.3*   No results for input(s): LIPASE, AMYLASE in the last 168 hours. No results for input(s): AMMONIA in the last 168 hours. Coagulation Profile: No results for input(s): INR, PROTIME in the last 168 hours.  Cardiac Enzymes:  Recent Labs Lab 08/18/15 1803 08/18/15 2241  TROPONINI <0.03 <0.03   BNP (last 3 results) No results for input(s): PROBNP in the last 8760 hours. HbA1C: No results for input(s): HGBA1C in the last 72 hours. CBG: No results for input(s): GLUCAP in the last 168 hours. Lipid Profile: No results for input(s): CHOL, HDL, LDLCALC, TRIG, CHOLHDL, LDLDIRECT in the last 72 hours. Thyroid Function Tests: No results for  input(s): TSH, T4TOTAL, FREET4, T3FREE, THYROIDAB in the last 72 hours. Anemia Panel: No results for input(s): VITAMINB12, FOLATE, FERRITIN, TIBC, IRON, RETICCTPCT in the last 72 hours. Urine analysis:    Component Value Date/Time   COLORURINE ORANGE* 05/07/2015 1248   APPEARANCEUR HAZY* 05/07/2015 1248   LABSPEC 1.010 05/07/2015 1248   PHURINE 5.5 05/07/2015 1248   GLUCOSEU NEGATIVE 05/07/2015 1248   HGBUR LARGE* 05/07/2015 1248   BILIRUBINUR SMALL* 05/07/2015 1248   KETONESUR NEGATIVE 05/07/2015 1248   PROTEINUR 30* 05/07/2015 1248   UROBILINOGEN 0.2 05/04/2014 0956   NITRITE NEGATIVE 05/07/2015 1248   LEUKOCYTESUR TRACE* 05/07/2015 1248   Sepsis Labs: @LABRCNTIP (procalcitonin:4,lacticidven:4)  ) Recent Results (from the past 240 hour(s))  MRSA PCR Screening     Status: Abnormal   Collection Time: 08/19/15 12:15 AM  Result Value Ref Range Status   MRSA by PCR POSITIVE (A) NEGATIVE Final    Comment:        The GeneXpert MRSA Assay (FDA approved for NASAL specimens only), is one component of a comprehensive MRSA colonization surveillance program. It is not intended to diagnose MRSA infection nor to guide or monitor treatment for MRSA infections. RESULT CALLED TO, READ BACK BY AND VERIFIED WITH:  PHILLIPS,C @ [place] ON 08/19/15 BY [facility]          Radiology Studies: No results found.      Scheduled Meds: . aspirin EC  81 mg Oral q morning - 10a  . azithromycin  500 mg Oral q1800  . budesonide (PULMICORT) nebulizer solution  0.25 mg Nebulization BID  . cefTRIAXone (ROCEPHIN)  IV  1 g Intravenous Q24H  . Chlorhexidine Gluconate Cloth  6 each Topical Q0600  . docusate sodium  100 mg Oral BID  . DULoxetine  60 mg Oral Daily  . enoxaparin (LOVENOX) injection  40 mg Subcutaneous Q24H  . ipratropium-albuterol  3 mL Nebulization Q6H WA  . loratadine  10 mg Oral Daily  . methylPREDNISolone (SOLU-MEDROL) injection  40 mg Intravenous Q12H  . multivitamin with  minerals  1 tablet Oral Daily  . mupirocin ointment  1 application Nasal BID  . pantoprazole  80 mg Oral Daily  . simvastatin  40 mg Oral q1800  . vitamin B-12  2,500 mcg Oral Daily   Continuous Infusions:    LOS: 3 days    Time spent: 25 minutes. Greater than 50% of this time was spent in direct contact with the patient coordinating care.     Lelon Frohlich, MD [facility] Pager [phone]  If 7PM-7AM, please contact night-coverage [email or web address] Password [facility] 08/21/2015, 3:39 PM

## 2015-08-21 NOTE — Progress Notes (Signed)
PHARMACIST - PHYSICIAN COMMUNICATION DR:   Jerilee Hoh CONCERNING: Antibiotic IV to Oral Route Change Policy  RECOMMENDATION: This patient is receiving ZITHROMAX by the intravenous route.  Based on criteria approved by the Pharmacy and Therapeutics Committee, the antibiotic(s) is/are being converted to the equivalent oral dose form(s).   DESCRIPTION: These criteria include:  Patient being treated for a respiratory tract infection, urinary tract infection, cellulitis or clostridium difficile associated diarrhea if on metronidazole  The patient is not neutropenic and does not exhibit a GI malabsorption state  The patient is eating (either orally or via tube) and/or has been taking other orally administered medications for a least 24 hours  The patient is improving clinically and has a Tmax < 100.5  If you have questions about this conversion, please contact the Pharmacy Department  [x]   [phone] )  [facility] []   [phone] )  [facility] []   [phone] )  [facility] []   [phone] )  [facility] []   [phone] )  [place], PharmD Clinical Pharmacist Pager:  [phone] 08/21/2015 12:58 PM

## 2015-08-22 MED ORDER — PREDNISONE 10 MG PO TABS: 10.0000 mg | ORAL_TABLET | Freq: Every day | ORAL | Status: DC

## 2015-08-22 MED ORDER — LEVOFLOXACIN 500 MG PO TABS: 500.0000 mg | ORAL_TABLET | Freq: Every day | ORAL | Status: DC

## 2015-08-22 NOTE — Consult Note (Signed)
   [facility] Inpatient Consult   08/22/2015  [name] [name] 05/07/1940 [id number]  Met with the patient at bedside to offer [facility] services as benefit of [employer] insurance. Patient states his daughter will be up later to pick him up for discharge and he will discuss [facility] services with her, states she handles his affairs. Patient given RNCM contact information and [facility] program brochure  for reference.  Of note, [facility] Care Management services does not replace or interfere with any services that are arranged by inpatient case management or social work.  For additional questions or referrals please contact:   [name]. [name], RN, BSN, [place] [phone]) Business Cell  [phone]) Toll Free Office

## 2015-08-22 NOTE — Care Management Note (Signed)
Case Management Note  Patient Details  Name: [name] [name] MRN: [id number] Date of Birth: February 21, 1941  Subjective/Objective:Spoke with patient for discharge planning. Patient is from home with roommate. He stated to me that he still drives self occasionally. He does rely on the help of his daughter as well. Patient has home O2 with [employer]. Daughter will bring portable when she picks him up this afternoon. Denies issues with filling prescriptions. Has a walker at home.  No CM needs identified.                    Action/Plan: home with self care.   Expected Discharge Date:  08/21/15               Expected Discharge Plan:  Home/Self Care  In-House Referral:     Discharge planning Services  CM Consult  Post Acute Care Choice:    Choice offered to:     DME Arranged:    DME Agency:  [place]  HH Arranged:    [place] Agency:     Status of Service:  Completed, signed off  Medicare Important Message Given:  Yes Date Medicare IM Given:    Medicare IM give by:    Date Additional Medicare IM Given:    Additional Medicare Important Message give by:     If discussed at [place] of Stay Meetings, dates discussed:    Additional Comments:  Alvie Heidelberg, RN 08/22/2015, 1:17 PM

## 2015-08-22 NOTE — Progress Notes (Signed)
Pt. Discharged home. IV d/c without difficulty. Given written prescriptions.

## 2015-08-22 NOTE — Care Management Important Message (Signed)
Important Message  Patient Details  Name: [name] [name] MRN: [id number] Date of Birth: [birth year]   Medicare Important Message Given:  Yes    Alvie Heidelberg, RN 08/22/2015, 12:59 PM

## 2015-08-22 NOTE — Discharge Summary (Signed)
Physician Discharge Summary  [name] [name] [id number] DOB: 08/06/1940 DOA: 08/18/2015  PCP: Kenn File, MD  Admit date: 08/18/2015 Discharge date: 08/22/2015  Time spent: 45 minutes  Recommendations for Outpatient Follow-up:  -Will be discharged home today. -Advised to f/u with PCP in 2 weeks.   Discharge Diagnoses:  Principal Problem:   Acute respiratory failure with hypoxia and hypercarbia (HCC) Active Problems:   Tobacco abuse   CAP (community acquired pneumonia)   HLD (hyperlipidemia)   Chronic anemia   Acute respiratory failure (HCC)   COPD exacerbation (HCC)   Discharge Condition: Stable and improved  Filed Weights   08/18/15 1732 08/18/15 2159 08/20/15 0500  Weight: 72.122 kg (159 lb) 69.4 kg (153 lb) 69.9 kg (154 lb 1.6 oz)    History of present illness:  As per Dr. Hal Hope on 5/5: [name] [name] is a 75 y.o. male with medical history significant of COPD, hyperlipidemia presents to the ER because of shortness of breath. Patient has been having shortness of breath with productive cough over the last 2-3 days. Denies any associated chest pain or fever or chills. In the ER patient was found be wheezing and hypoxic and was placed on BiPAP. Chest x-ray shows possible pneumonia for which patient had been placed on empiric antibiotics. In addition patient also was given nebulizer treatment and admitted for acute respiratory failure secondary to COPD and pneumonia.   ED Course: Patient was placed on BiPAP and started on antibiotics and nebulizer.  Hospital Course:   Acute on chronic hypoxic and hypercarbic respiratory failure -Due to COPD with acute exacerbation. -Has been weaned to nasal cannula oxygen.  COPD with acute exacerbation -Discharge home on prednisone taper and empiric antibiotics.  Community-acquired pneumonia -Levaquin x 5 days. -Culture data remains pending at this time. -Strep pneumonia urinary antigen is negative.  Tobacco  abuse -Counseled on cessation  Procedures:  None   Consultations:  None  Discharge Instructions  Discharge Instructions    Diet - low sodium heart healthy    Complete by:  As directed      Increase activity slowly    Complete by:  As directed             Medication List    STOP taking these medications        albuterol-ipratropium 18-103 MCG/ACT inhaler  Commonly known as:  COMBIVENT     cephALEXin 500 MG capsule  Commonly known as:  KEFLEX     sodium chloride 0.65 % Soln nasal spray  Commonly known as:  OCEAN      TAKE these medications        albuterol (2.5 MG/3ML) 0.083% nebulizer solution  Commonly known as:  PROVENTIL  Take 2.5 mg by nebulization every 4 (four) hours as needed. For shortness of breath     ALPRAZolam 1 MG tablet  Commonly known as:  XANAX  Take 0.5-1 mg by mouth 2 (two) times daily as needed for anxiety. anxiety     aspirin EC 81 MG tablet  Take 81 mg by mouth every morning.     B-12 2500 MCG Tabs  Take 1 tablet by mouth daily.     budesonide-formoterol 160-4.5 MCG/ACT inhaler  Commonly known as:  SYMBICORT  Inhale 2 puffs into the lungs 2 (two) times daily.     citalopram 20 MG tablet  Commonly known as:  CELEXA  Take 1 tablet by mouth daily.     DALIRESP 500 MCG Tabs tablet  Generic drug:  roflumilast  Take 500 mcg by mouth daily.     docusate sodium 100 MG capsule  Commonly known as:  COLACE  Take 100 mg by mouth 2 (two) times daily.     DULoxetine 60 MG capsule  Commonly known as:  CYMBALTA  Take 1 capsule (60 mg total) by mouth daily.     esomeprazole 40 MG capsule  Commonly known as:  NEXIUM  Take 40 mg by mouth every morning.     Garlic 123XX123 MG Caps  Take 1 capsule by mouth every morning.     levofloxacin 500 MG tablet  Commonly known as:  LEVAQUIN  Take 1 tablet (500 mg total) by mouth daily.     loratadine 10 MG tablet  Commonly known as:  CLARITIN  Take 10 mg by mouth daily.     multivitamin with  minerals Tabs tablet  Take 1 tablet by mouth daily.     NITROSTAT 0.4 MG SL tablet  Generic drug:  nitroGLYCERIN  Place 1 tablet under the tongue every 5 (five) minutes as needed for chest pain.     predniSONE 10 MG tablet  Commonly known as:  DELTASONE  Take 1 tablet (10 mg total) by mouth daily with breakfast. Take 6 tablets today and then decrease by 1 tablet daily until none are left.     simvastatin 40 MG tablet  Commonly known as:  ZOCOR  Take 40 mg by mouth every morning.     SPIRIVA HANDIHALER 18 MCG inhalation capsule  Generic drug:  tiotropium  Place 1 capsule into inhaler and inhale daily.     Vitamin D3 1000 units Caps  Take 1 capsule by mouth every morning.       Allergies  Allergen Reactions  . Iohexol Hives     Code: HIVES, Desc: PT STATES HE BROKE OUT IN HIVES AND RASH AFTER CT NECK EARLY SEPT 2011; NO RESP PROBLEMS; NEEDS PRE-MEDS; MKS, Onset Date: [id number]   . Ivp Dye [Iodinated Diagnostic Agents] Hives       Follow-up Information    Follow up with Kenn File, MD. Schedule an appointment as soon as possible for a visit in 2 weeks.   Specialty:  Family Medicine   Contact information:   [place] [address] [phone]        The results of significant diagnostics from this hospitalization (including imaging, microbiology, ancillary and laboratory) are listed below for reference.    Significant Diagnostic Studies: Dg Chest Port 1 View  08/18/2015  CLINICAL DATA:  Shortness of breath and cough for 3 weeks. EXAM: PORTABLE CHEST 1 VIEW COMPARISON:  05/07/2015 and prior exams FINDINGS: The cardiomediastinal silhouette is unremarkable. Bilateral lower lung opacities are noted and may represent atelectasis or airspace disease. There is no evidence of pleural effusion or pneumothorax. No acute bony abnormalities are identified. IMPRESSION: Bilateral lower lung opacities which may represent atelectasis or airspace disease/pneumonia.  Electronically Signed   By: Margarette Canada M.D.   On: 08/18/2015 18:20    Microbiology: Recent Results (from the past 240 hour(s))  MRSA PCR Screening     Status: Abnormal   Collection Time: 08/19/15 12:15 AM  Result Value Ref Range Status   MRSA by PCR POSITIVE (A) NEGATIVE Final    Comment:        The GeneXpert MRSA Assay (FDA approved for NASAL specimens only), is one component of a comprehensive MRSA colonization surveillance program. It is not intended to  diagnose MRSA infection nor to guide or monitor treatment for MRSA infections. RESULT CALLED TO, READ BACK BY AND VERIFIED WITH:  PHILLIPS,C @ 0302 ON 08/19/15 BY [place],J      Labs: Basic Metabolic Panel:  Recent Labs Lab 08/18/15 1803 08/19/15 0812  NA 134* 132*  K 4.8 5.4*  CL 93* 91*  CO2 31 31  GLUCOSE 134* 151*  BUN 12 17  CREATININE 0.90 0.73  CALCIUM 9.7 9.7   Liver Function Tests:  Recent Labs Lab 08/19/15 0812  AST 16  ALT 20  ALKPHOS 89  BILITOT 0.4  PROT 7.6  ALBUMIN 3.3*   No results for input(s): LIPASE, AMYLASE in the last 168 hours. No results for input(s): AMMONIA in the last 168 hours. CBC:  Recent Labs Lab 08/18/15 1803 08/19/15 0812  WBC 25.0* 17.8*  NEUTROABS 18.9* 16.0*  HGB 12.4* 11.5*  HCT 37.4* 35.5*  MCV 92.8 92.4  PLT 383 355   Cardiac Enzymes:  Recent Labs Lab 08/18/15 1803 08/18/15 2241  TROPONINI <0.03 <0.03   BNP: BNP (last 3 results) No results for input(s): BNP in the last 8760 hours.  ProBNP (last 3 results) No results for input(s): PROBNP in the last 8760 hours.  CBG: No results for input(s): GLUCAP in the last 168 hours.     SignedLelon Frohlich  [facility] Pager: [phone] 08/22/2015, 3:10 PM

## 2015-08-23 DIAGNOSIS — Z9981 Dependence on supplemental oxygen: Secondary | ICD-10-CM | POA: Diagnosis not present

## 2015-08-23 DIAGNOSIS — C679 Malignant neoplasm of bladder, unspecified: Secondary | ICD-10-CM | POA: Diagnosis not present

## 2015-08-23 DIAGNOSIS — J449 Chronic obstructive pulmonary disease, unspecified: Secondary | ICD-10-CM | POA: Diagnosis not present

## 2015-08-23 DIAGNOSIS — J9611 Chronic respiratory failure with hypoxia: Secondary | ICD-10-CM | POA: Diagnosis not present

## 2015-08-23 DIAGNOSIS — Z7982 Long term (current) use of aspirin: Secondary | ICD-10-CM | POA: Diagnosis not present

## 2015-08-24 ENCOUNTER — Telehealth: Payer: Self-pay | Admitting: *Deleted

## 2015-08-24 DIAGNOSIS — Z7982 Long term (current) use of aspirin: Secondary | ICD-10-CM | POA: Diagnosis not present

## 2015-08-24 DIAGNOSIS — C679 Malignant neoplasm of bladder, unspecified: Secondary | ICD-10-CM | POA: Diagnosis not present

## 2015-08-24 DIAGNOSIS — J9611 Chronic respiratory failure with hypoxia: Secondary | ICD-10-CM | POA: Diagnosis not present

## 2015-08-24 DIAGNOSIS — J449 Chronic obstructive pulmonary disease, unspecified: Secondary | ICD-10-CM | POA: Diagnosis not present

## 2015-08-24 DIAGNOSIS — Z9981 Dependence on supplemental oxygen: Secondary | ICD-10-CM | POA: Diagnosis not present

## 2015-08-24 NOTE — Telephone Encounter (Signed)
Call Completed and Appointment Scheduled: Yes, Date: 09/04/15 with Dr Marline Backbone INFORMATION Date of Discharge:08/19/15  Discharge Facility: [facility]  Principal Discharge Diagnosis: CAP  Patient and/or caregiver is knowledgeable of his/her condition(s) and treatment: Yes  MEDICATION RECONCILIATION Current medication list reviewed with patient:Yes Discharge Medications reviewed and reconciled with current medications.yes  Patient is able to obtain needed medications:Yes  ACTIVITIES OF DAILY LIVING  Is the patient able to perform his/her own ADLs: Yes.    Patient is receiving home health services: Yes.    PATIENT EDUCATION Questions/Concerns Discussed: Patient's telephone number is actually his daughter. Appt rescheduled from 5/24 with Dr Dettinger to 5/22 with Dr Wendi Snipes. Patient has [facility] and PT.

## 2015-08-25 DIAGNOSIS — A419 Sepsis, unspecified organism: Secondary | ICD-10-CM | POA: Diagnosis not present

## 2015-08-25 DIAGNOSIS — J449 Chronic obstructive pulmonary disease, unspecified: Secondary | ICD-10-CM | POA: Diagnosis not present

## 2015-08-25 DIAGNOSIS — R51 Headache: Secondary | ICD-10-CM | POA: Diagnosis not present

## 2015-08-25 DIAGNOSIS — J9611 Chronic respiratory failure with hypoxia: Secondary | ICD-10-CM | POA: Diagnosis not present

## 2015-08-28 DIAGNOSIS — Z9981 Dependence on supplemental oxygen: Secondary | ICD-10-CM | POA: Diagnosis not present

## 2015-08-28 DIAGNOSIS — C679 Malignant neoplasm of bladder, unspecified: Secondary | ICD-10-CM | POA: Diagnosis not present

## 2015-08-28 DIAGNOSIS — J9611 Chronic respiratory failure with hypoxia: Secondary | ICD-10-CM | POA: Diagnosis not present

## 2015-08-28 DIAGNOSIS — J449 Chronic obstructive pulmonary disease, unspecified: Secondary | ICD-10-CM | POA: Diagnosis not present

## 2015-08-28 DIAGNOSIS — Z7982 Long term (current) use of aspirin: Secondary | ICD-10-CM | POA: Diagnosis not present

## 2015-08-29 ENCOUNTER — Telehealth: Payer: Self-pay

## 2015-08-29 DIAGNOSIS — C679 Malignant neoplasm of bladder, unspecified: Secondary | ICD-10-CM | POA: Diagnosis not present

## 2015-08-29 NOTE — Telephone Encounter (Signed)
[name] [name] heart rates  08/18/15 110  08/24/15  98  08/28/15  105 at rest

## 2015-08-29 NOTE — Telephone Encounter (Signed)
We were contacted last night by home health for concerns of tachypnea. He was recommended to go the emergency room at that time but then refused. He did agree to come in for an appointment tomorrow, I would recommend keeping that appointment.  Monitor closely until that time.  Laroy Apple, MD [facility] 08/29/2015, 10:17 AM

## 2015-08-30 ENCOUNTER — Ambulatory Visit: Payer: Commercial Managed Care - HMO | Admitting: Family Medicine

## 2015-08-30 ENCOUNTER — Encounter: Payer: Self-pay | Admitting: Family Medicine

## 2015-08-30 ENCOUNTER — Ambulatory Visit (INDEPENDENT_AMBULATORY_CARE_PROVIDER_SITE_OTHER): Payer: Commercial Managed Care - HMO | Admitting: Family Medicine

## 2015-08-30 VITALS — BP 101/76 | HR 95 | Temp 97.6°F | Ht 65.0 in | Wt 155.2 lb

## 2015-08-30 DIAGNOSIS — J9611 Chronic respiratory failure with hypoxia: Secondary | ICD-10-CM

## 2015-08-30 DIAGNOSIS — J441 Chronic obstructive pulmonary disease with (acute) exacerbation: Secondary | ICD-10-CM

## 2015-08-30 DIAGNOSIS — Z7982 Long term (current) use of aspirin: Secondary | ICD-10-CM | POA: Diagnosis not present

## 2015-08-30 DIAGNOSIS — C679 Malignant neoplasm of bladder, unspecified: Secondary | ICD-10-CM | POA: Diagnosis not present

## 2015-08-30 DIAGNOSIS — Z9981 Dependence on supplemental oxygen: Secondary | ICD-10-CM | POA: Diagnosis not present

## 2015-08-30 DIAGNOSIS — J449 Chronic obstructive pulmonary disease, unspecified: Secondary | ICD-10-CM | POA: Diagnosis not present

## 2015-08-30 NOTE — Progress Notes (Signed)
BP 101/76 mmHg  Pulse 95  Temp(Src) 97.6 F (36.4 C) (Oral)  Ht 5\' 5"  (1.651 m)  Wt 155 lb 3.2 oz (70.398 kg)  BMI 25.83 kg/m2   Subjective:    Patient ID: [name] [name], male    DOB: 04/03/1941, 75 y.o.   MRN: [id number]  HPI: [name] [name] is a 75 y.o. male presenting on 08/30/2015 for Hospital followup   HPI Hospital follow-up/transitional care/COPD exacerbation Patient is coming in for hospital follow-up after a COPD exacerbation. He was seen here in the office on 08/18/2015 and was found to be hypoxic with increased respiratory rate. He was admitted to the hospital on 08/18/2015 and discharged from the hospital on 08/22/2015. Since the hospital stay this is still having some wheezing but he is back on his home levels of oxygen. He is feeling like he is starting to gain his energy back a little bit. He denies any fevers or chills or shortness of breath. He is still smoking and we discussed this with him. He was sent home with Levaquin 5 days. He was diagnosed with community acquired pneumonia while he was there. He was contacted by our office on 08/24/2015 and scheduled a transitional care appointment.  Relevant past medical, surgical, family and social history reviewed and updated as indicated. Interim medical history since our last visit reviewed. Allergies and medications reviewed and updated.  Review of Systems  Constitutional: Negative for fever and chills.  HENT: Negative for congestion, ear discharge, ear pain, postnasal drip, rhinorrhea, sinus pressure, sneezing, sore throat and voice change.   Eyes: Negative for pain, discharge, redness and visual disturbance.  Respiratory: Positive for cough and wheezing. Negative for chest tightness and shortness of breath.   Cardiovascular: Negative for chest pain and leg swelling.  Gastrointestinal: Negative for abdominal pain, diarrhea and constipation.  Genitourinary: Negative for difficulty urinating.  Musculoskeletal: Negative  for back pain and gait problem.  Skin: Negative for rash.  Neurological: Negative for syncope, light-headedness and headaches.  All other systems reviewed and are negative.   Per HPI unless specifically indicated above     Medication List       This list is accurate as of: 08/30/15 12:00 PM.  Always use your most recent med list.               albuterol (2.5 MG/3ML) 0.083% nebulizer solution  Commonly known as:  PROVENTIL  Take 2.5 mg by nebulization every 4 (four) hours as needed. For shortness of breath     ALPRAZolam 1 MG tablet  Commonly known as:  XANAX  Take 0.5-1 mg by mouth 2 (two) times daily as needed for anxiety. anxiety     aspirin EC 81 MG tablet  Take 81 mg by mouth every morning.     B-12 2500 MCG Tabs  Take 1 tablet by mouth daily.     budesonide-formoterol 160-4.5 MCG/ACT inhaler  Commonly known as:  SYMBICORT  Inhale 2 puffs into the lungs 2 (two) times daily.     citalopram 20 MG tablet  Commonly known as:  CELEXA  Take 1 tablet by mouth daily.     DALIRESP 500 MCG Tabs tablet  Generic drug:  roflumilast  Take 500 mcg by mouth daily.     docusate sodium 100 MG capsule  Commonly known as:  COLACE  Take 100 mg by mouth 2 (two) times daily.     DULoxetine 60 MG capsule  Commonly known as:  CYMBALTA  Take  1 capsule (60 mg total) by mouth daily.     esomeprazole 40 MG capsule  Commonly known as:  NEXIUM  Take 40 mg by mouth every morning.     Garlic 123XX123 MG Caps  Take 1 capsule by mouth every morning.     loratadine 10 MG tablet  Commonly known as:  CLARITIN  Take 10 mg by mouth daily.     multivitamin with minerals Tabs tablet  Take 1 tablet by mouth daily.     NITROSTAT 0.4 MG SL tablet  Generic drug:  nitroGLYCERIN  Place 1 tablet under the tongue every 5 (five) minutes as needed for chest pain.     predniSONE 10 MG tablet  Commonly known as:  DELTASONE  Take 1 tablet (10 mg total) by mouth daily with breakfast. Take 6 tablets  today and then decrease by 1 tablet daily until none are left.     simvastatin 40 MG tablet  Commonly known as:  ZOCOR  Take 40 mg by mouth every morning.     SPIRIVA HANDIHALER 18 MCG inhalation capsule  Generic drug:  tiotropium  Place 1 capsule into inhaler and inhale daily.     Vitamin D3 1000 units Caps  Take 1 capsule by mouth every morning.           Objective:    BP 101/76 mmHg  Pulse 95  Temp(Src) 97.6 F (36.4 C) (Oral)  Ht 5\' 5"  (1.651 m)  Wt 155 lb 3.2 oz (70.398 kg)  BMI 25.83 kg/m2  Wt Readings from Last 3 Encounters:  08/30/15 155 lb 3.2 oz (70.398 kg)  08/20/15 154 lb 1.6 oz (69.9 kg)  08/01/15 159 lb 9.6 oz (72.394 kg)    Physical Exam  Constitutional: He is oriented to person, place, and time. He appears well-developed and well-nourished. No distress.  HENT:  Right Ear: Tympanic membrane, external ear and ear canal normal.  Left Ear: Tympanic membrane, external ear and ear canal normal.  Nose: Mucosal edema and rhinorrhea present. No sinus tenderness. No epistaxis. Right sinus exhibits maxillary sinus tenderness. Right sinus exhibits no frontal sinus tenderness. Left sinus exhibits maxillary sinus tenderness. Left sinus exhibits no frontal sinus tenderness.  Mouth/Throat: Uvula is midline and mucous membranes are normal. Posterior oropharyngeal edema and posterior oropharyngeal erythema present. No oropharyngeal exudate or tonsillar abscesses.  Eyes: Conjunctivae and EOM are normal. Pupils are equal, round, and reactive to light. Right eye exhibits no discharge. No scleral icterus.  Neck: Neck supple. No thyromegaly present.  Cardiovascular: Normal rate, regular rhythm, normal heart sounds and intact distal pulses.   No murmur heard. Pulmonary/Chest: Effort normal. No respiratory distress. He has wheezes (Much improved, end expiratory). He has no rales. He exhibits no tenderness.  Musculoskeletal: Normal range of motion. He exhibits no edema.    Lymphadenopathy:    He has no cervical adenopathy.  Neurological: He is alert and oriented to person, place, and time. Coordination normal.  Skin: Skin is warm and dry. No rash noted. He is not diaphoretic.  Psychiatric: He has a normal mood and affect. His behavior is normal.  Nursing note and vitals reviewed.   Results for orders placed or performed during the hospital encounter of 08/18/15  MRSA PCR Screening  Result Value Ref Range   MRSA by PCR POSITIVE (A) NEGATIVE  CBC with Differential  Result Value Ref Range   WBC 25.0 (H) 4.0 - 10.5 K/uL   RBC 4.03 (L) 4.22 - 5.81 MIL/uL  Hemoglobin 12.4 (L) 13.0 - 17.0 g/dL   HCT 37.4 (L) 39.0 - 52.0 %   MCV 92.8 78.0 - 100.0 fL   MCH 30.8 26.0 - 34.0 pg   MCHC 33.2 30.0 - 36.0 g/dL   RDW 13.7 11.5 - 15.5 %   Platelets 383 150 - 400 K/uL   Neutrophils Relative % 76 %   Neutro Abs 18.9 (H) 1.7 - 7.7 K/uL   Lymphocytes Relative 15 %   Lymphs Abs 3.8 0.7 - 4.0 K/uL   Monocytes Relative 8 %   Monocytes Absolute 2.1 (H) 0.1 - 1.0 K/uL   Eosinophils Relative 1 %   Eosinophils Absolute 0.2 0.0 - 0.7 K/uL   Basophils Relative 0 %   Basophils Absolute 0.0 0.0 - 0.1 K/uL  Basic metabolic panel  Result Value Ref Range   Sodium 134 (L) 135 - 145 mmol/L   Potassium 4.8 3.5 - 5.1 mmol/L   Chloride 93 (L) 101 - 111 mmol/L   CO2 31 22 - 32 mmol/L   Glucose, Bld 134 (H) 65 - 99 mg/dL   BUN 12 6 - 20 mg/dL   Creatinine, Ser 0.90 0.61 - 1.24 mg/dL   Calcium 9.7 8.9 - 10.3 mg/dL   GFR calc non Af Amer >60 >60 mL/min   GFR calc Af Amer >60 >60 mL/min   Anion gap 10 5 - 15  Troponin I  Result Value Ref Range   Troponin I <0.03 <0.031 ng/mL  Blood gas, arterial  Result Value Ref Range   FIO2 0.40    Delivery systems BILEVEL POSITIVE AIRWAY PRESSURE    LHR 14 resp/min   Inspiratory PAP 16    Expiratory PAP 8    pH, Arterial 7.377 7.350 - 7.450   pCO2 arterial 50.2 (H) 35.0 - 45.0 mmHg   pO2, Arterial 76.8 (L) 80.0 - 100.0 mmHg    Bicarbonate 27.3 (H) 20.0 - 24.0 mEq/L   TCO2 50.2 0 - 100 mmol/L   Acid-Base Excess 3.9 (H) 0.0 - 2.0 mmol/L   O2 Saturation 95.3 %   Collection site RIGHT RADIAL    Drawn by [id number]    Sample type ARTERIAL DRAW    Allens test (pass/fail) PASS PASS  Troponin I  Result Value Ref Range   Troponin I <0.03 <0.031 ng/mL  Strep pneumoniae urinary antigen  Result Value Ref Range   Strep Pneumo Urinary Antigen NEGATIVE NEGATIVE  CBC with Differential/Platelet  Result Value Ref Range   WBC 17.8 (H) 4.0 - 10.5 K/uL   RBC 3.84 (L) 4.22 - 5.81 MIL/uL   Hemoglobin 11.5 (L) 13.0 - 17.0 g/dL   HCT 35.5 (L) 39.0 - 52.0 %   MCV 92.4 78.0 - 100.0 fL   MCH 29.9 26.0 - 34.0 pg   MCHC 32.4 30.0 - 36.0 g/dL   RDW 13.7 11.5 - 15.5 %   Platelets 355 150 - 400 K/uL   Neutrophils Relative % 89 %   Neutro Abs 16.0 (H) 1.7 - 7.7 K/uL   Lymphocytes Relative 9 %   Lymphs Abs 1.6 0.7 - 4.0 K/uL   Monocytes Relative 2 %   Monocytes Absolute 0.3 0.1 - 1.0 K/uL   Eosinophils Relative 0 %   Eosinophils Absolute 0.0 0.0 - 0.7 K/uL   Basophils Relative 0 %   Basophils Absolute 0.0 0.0 - 0.1 K/uL  Comprehensive metabolic panel  Result Value Ref Range   Sodium 132 (L) 135 - 145 mmol/L   Potassium 5.4 (  H) 3.5 - 5.1 mmol/L   Chloride 91 (L) 101 - 111 mmol/L   CO2 31 22 - 32 mmol/L   Glucose, Bld 151 (H) 65 - 99 mg/dL   BUN 17 6 - 20 mg/dL   Creatinine, Ser 0.73 0.61 - 1.24 mg/dL   Calcium 9.7 8.9 - 10.3 mg/dL   Total Protein 7.6 6.5 - 8.1 g/dL   Albumin 3.3 (L) 3.5 - 5.0 g/dL   AST 16 15 - 41 U/L   ALT 20 17 - 63 U/L   Alkaline Phosphatase 89 38 - 126 U/L   Total Bilirubin 0.4 0.3 - 1.2 mg/dL   GFR calc non Af Amer >60 >60 mL/min   GFR calc Af Amer >60 >60 mL/min   Anion gap 10 5 - 15      Assessment & Plan:   Problem List Items Addressed This Visit      Respiratory   Chronic respiratory failure with hypoxia (HCC)   COPD exacerbation ([place]) - Primary    Patient was sent to the hospital for COPD  exacerbation during her last visit. He was hospitalized for a total of 4-1/2 days and had to be placed on a BiPAP machine. They'll also had to sedate him at one point. He feels like he is doing a lot better since he left the hospital about 2 weeks ago.          Follow up plan: Return in about 4 weeks (around 09/27/2015), or if symptoms worsen or fail to improve, for Follow-up COPD.  Counseling provided for all of the vaccine components No orders of the defined types were placed in this encounter.    Caryl Pina, MD [facility] 08/30/2015, 12:00 PM

## 2015-08-30 NOTE — Assessment & Plan Note (Signed)
Patient was sent to the hospital for COPD exacerbation during her last visit. He was hospitalized for a total of 4-1/2 days and had to be placed on a BiPAP machine. They'll also had to sedate him at one point. He feels like he is doing a lot better since he left the hospital about 2 weeks ago.

## 2015-08-31 ENCOUNTER — Ambulatory Visit: Payer: Commercial Managed Care - HMO | Admitting: Family Medicine

## 2015-08-31 DIAGNOSIS — Z7982 Long term (current) use of aspirin: Secondary | ICD-10-CM | POA: Diagnosis not present

## 2015-08-31 DIAGNOSIS — Z9981 Dependence on supplemental oxygen: Secondary | ICD-10-CM | POA: Diagnosis not present

## 2015-08-31 DIAGNOSIS — C679 Malignant neoplasm of bladder, unspecified: Secondary | ICD-10-CM | POA: Diagnosis not present

## 2015-08-31 DIAGNOSIS — J9611 Chronic respiratory failure with hypoxia: Secondary | ICD-10-CM | POA: Diagnosis not present

## 2015-08-31 DIAGNOSIS — J449 Chronic obstructive pulmonary disease, unspecified: Secondary | ICD-10-CM | POA: Diagnosis not present

## 2015-09-04 ENCOUNTER — Telehealth: Payer: Self-pay | Admitting: Pharmacist

## 2015-09-04 ENCOUNTER — Ambulatory Visit: Payer: Commercial Managed Care - HMO | Admitting: Family Medicine

## 2015-09-04 NOTE — Telephone Encounter (Signed)
Received correspondence from [facility] regarding drug-drug interactions:  Reviewed report and discussed recommendations with Dr Wendi Snipes.  Recommended: 1 - stop Garlic as it can increase meds with CPY 2C19 metabolism. 2- he is on 2-3 meds than can increase QTc interval - last EKG (08/21/2015) QTc was WNL.  Continue to monitor EKG q 6 to 12 month or sooner if increase HR, or s/s of irregular HR.  3 - he is also taking meds that can increase bleeding (citalopram and ASA) his last HBG was 11.7 (08/2015) Recommend recheck in 4 weeks when he sees PCP and monitor for s/s of bleeding.   Patient called - No ans but left message to stop Garlic

## 2015-09-05 DIAGNOSIS — C679 Malignant neoplasm of bladder, unspecified: Secondary | ICD-10-CM | POA: Diagnosis not present

## 2015-09-05 DIAGNOSIS — Z9981 Dependence on supplemental oxygen: Secondary | ICD-10-CM | POA: Diagnosis not present

## 2015-09-05 DIAGNOSIS — J9611 Chronic respiratory failure with hypoxia: Secondary | ICD-10-CM | POA: Diagnosis not present

## 2015-09-05 DIAGNOSIS — J449 Chronic obstructive pulmonary disease, unspecified: Secondary | ICD-10-CM | POA: Diagnosis not present

## 2015-09-05 DIAGNOSIS — Z7982 Long term (current) use of aspirin: Secondary | ICD-10-CM | POA: Diagnosis not present

## 2015-09-06 ENCOUNTER — Ambulatory Visit: Payer: Commercial Managed Care - HMO | Admitting: Family Medicine

## 2015-09-07 DIAGNOSIS — J9611 Chronic respiratory failure with hypoxia: Secondary | ICD-10-CM | POA: Diagnosis not present

## 2015-09-07 DIAGNOSIS — Z9981 Dependence on supplemental oxygen: Secondary | ICD-10-CM | POA: Diagnosis not present

## 2015-09-07 DIAGNOSIS — Z7982 Long term (current) use of aspirin: Secondary | ICD-10-CM | POA: Diagnosis not present

## 2015-09-07 DIAGNOSIS — J449 Chronic obstructive pulmonary disease, unspecified: Secondary | ICD-10-CM | POA: Diagnosis not present

## 2015-09-07 DIAGNOSIS — C679 Malignant neoplasm of bladder, unspecified: Secondary | ICD-10-CM | POA: Diagnosis not present

## 2015-09-08 DIAGNOSIS — Z7982 Long term (current) use of aspirin: Secondary | ICD-10-CM | POA: Diagnosis not present

## 2015-09-08 DIAGNOSIS — C679 Malignant neoplasm of bladder, unspecified: Secondary | ICD-10-CM | POA: Diagnosis not present

## 2015-09-08 DIAGNOSIS — J449 Chronic obstructive pulmonary disease, unspecified: Secondary | ICD-10-CM | POA: Diagnosis not present

## 2015-09-08 DIAGNOSIS — Z9981 Dependence on supplemental oxygen: Secondary | ICD-10-CM | POA: Diagnosis not present

## 2015-09-08 DIAGNOSIS — J9611 Chronic respiratory failure with hypoxia: Secondary | ICD-10-CM | POA: Diagnosis not present

## 2015-09-12 DIAGNOSIS — D494 Neoplasm of unspecified behavior of bladder: Secondary | ICD-10-CM | POA: Diagnosis not present

## 2015-09-13 DIAGNOSIS — J9611 Chronic respiratory failure with hypoxia: Secondary | ICD-10-CM | POA: Diagnosis not present

## 2015-09-13 DIAGNOSIS — J449 Chronic obstructive pulmonary disease, unspecified: Secondary | ICD-10-CM | POA: Diagnosis not present

## 2015-09-13 DIAGNOSIS — C679 Malignant neoplasm of bladder, unspecified: Secondary | ICD-10-CM | POA: Diagnosis not present

## 2015-09-13 DIAGNOSIS — Z7982 Long term (current) use of aspirin: Secondary | ICD-10-CM | POA: Diagnosis not present

## 2015-09-13 DIAGNOSIS — Z9981 Dependence on supplemental oxygen: Secondary | ICD-10-CM | POA: Diagnosis not present

## 2015-09-14 ENCOUNTER — Ambulatory Visit (INDEPENDENT_AMBULATORY_CARE_PROVIDER_SITE_OTHER): Payer: Commercial Managed Care - HMO | Admitting: Otolaryngology

## 2015-09-14 DIAGNOSIS — J9611 Chronic respiratory failure with hypoxia: Secondary | ICD-10-CM | POA: Diagnosis not present

## 2015-09-14 DIAGNOSIS — J449 Chronic obstructive pulmonary disease, unspecified: Secondary | ICD-10-CM | POA: Diagnosis not present

## 2015-09-14 DIAGNOSIS — C679 Malignant neoplasm of bladder, unspecified: Secondary | ICD-10-CM | POA: Diagnosis not present

## 2015-09-14 DIAGNOSIS — Z9981 Dependence on supplemental oxygen: Secondary | ICD-10-CM | POA: Diagnosis not present

## 2015-09-14 DIAGNOSIS — Z7982 Long term (current) use of aspirin: Secondary | ICD-10-CM | POA: Diagnosis not present

## 2015-09-16 ENCOUNTER — Other Ambulatory Visit: Payer: Self-pay | Admitting: Family Medicine

## 2015-09-18 DIAGNOSIS — C679 Malignant neoplasm of bladder, unspecified: Secondary | ICD-10-CM | POA: Diagnosis not present

## 2015-09-18 DIAGNOSIS — Z7982 Long term (current) use of aspirin: Secondary | ICD-10-CM | POA: Diagnosis not present

## 2015-09-18 DIAGNOSIS — J449 Chronic obstructive pulmonary disease, unspecified: Secondary | ICD-10-CM | POA: Diagnosis not present

## 2015-09-18 DIAGNOSIS — Z9981 Dependence on supplemental oxygen: Secondary | ICD-10-CM | POA: Diagnosis not present

## 2015-09-18 DIAGNOSIS — J9611 Chronic respiratory failure with hypoxia: Secondary | ICD-10-CM | POA: Diagnosis not present

## 2015-09-18 NOTE — Telephone Encounter (Signed)
Last seen 08/30/2015 Please review and advise

## 2015-09-19 DIAGNOSIS — C679 Malignant neoplasm of bladder, unspecified: Secondary | ICD-10-CM | POA: Diagnosis not present

## 2015-09-19 DIAGNOSIS — J9611 Chronic respiratory failure with hypoxia: Secondary | ICD-10-CM | POA: Diagnosis not present

## 2015-09-19 DIAGNOSIS — Z7982 Long term (current) use of aspirin: Secondary | ICD-10-CM | POA: Diagnosis not present

## 2015-09-19 DIAGNOSIS — J449 Chronic obstructive pulmonary disease, unspecified: Secondary | ICD-10-CM | POA: Diagnosis not present

## 2015-09-19 DIAGNOSIS — Z9981 Dependence on supplemental oxygen: Secondary | ICD-10-CM | POA: Diagnosis not present

## 2015-09-20 ENCOUNTER — Telehealth: Payer: Self-pay | Admitting: Family Medicine

## 2015-09-20 DIAGNOSIS — R296 Repeated falls: Secondary | ICD-10-CM

## 2015-09-20 NOTE — Telephone Encounter (Signed)
Does he ntbs first? Please advise

## 2015-09-21 ENCOUNTER — Telehealth: Payer: Self-pay

## 2015-09-21 DIAGNOSIS — Z7982 Long term (current) use of aspirin: Secondary | ICD-10-CM | POA: Diagnosis not present

## 2015-09-21 DIAGNOSIS — C679 Malignant neoplasm of bladder, unspecified: Secondary | ICD-10-CM | POA: Diagnosis not present

## 2015-09-21 DIAGNOSIS — R296 Repeated falls: Secondary | ICD-10-CM | POA: Insufficient documentation

## 2015-09-21 DIAGNOSIS — J449 Chronic obstructive pulmonary disease, unspecified: Secondary | ICD-10-CM | POA: Diagnosis not present

## 2015-09-21 DIAGNOSIS — J9611 Chronic respiratory failure with hypoxia: Secondary | ICD-10-CM | POA: Diagnosis not present

## 2015-09-21 DIAGNOSIS — Z9981 Dependence on supplemental oxygen: Secondary | ICD-10-CM | POA: Diagnosis not present

## 2015-09-21 NOTE — Telephone Encounter (Addendum)
Happy to refer.   Laroy Apple, MD [facility] 09/21/2015, 7:48 AM   After discussion with nursing, [facility] a [facility] home, Pt will NTBS.   Laroy Apple, MD [facility] 09/21/2015, 7:51 AM

## 2015-09-22 DIAGNOSIS — Z9981 Dependence on supplemental oxygen: Secondary | ICD-10-CM | POA: Diagnosis not present

## 2015-09-22 DIAGNOSIS — Z7982 Long term (current) use of aspirin: Secondary | ICD-10-CM | POA: Diagnosis not present

## 2015-09-22 DIAGNOSIS — J449 Chronic obstructive pulmonary disease, unspecified: Secondary | ICD-10-CM | POA: Diagnosis not present

## 2015-09-22 DIAGNOSIS — C679 Malignant neoplasm of bladder, unspecified: Secondary | ICD-10-CM | POA: Diagnosis not present

## 2015-09-22 DIAGNOSIS — J9611 Chronic respiratory failure with hypoxia: Secondary | ICD-10-CM | POA: Diagnosis not present

## 2015-09-22 NOTE — Telephone Encounter (Signed)
Patients daughter aware that he will have to be seen.

## 2015-09-22 NOTE — Telephone Encounter (Signed)
x

## 2015-09-24 ENCOUNTER — Encounter (HOSPITAL_COMMUNITY): Payer: Self-pay

## 2015-09-24 ENCOUNTER — Emergency Department (HOSPITAL_COMMUNITY): Payer: Commercial Managed Care - HMO

## 2015-09-24 ENCOUNTER — Inpatient Hospital Stay (HOSPITAL_COMMUNITY)
Admission: EM | Admit: 2015-09-24 | Discharge: 2015-09-28 | DRG: 190 | Disposition: A | Discharge status: Home Health | Payer: Commercial Managed Care - HMO | Attending: Internal Medicine | Admitting: Internal Medicine

## 2015-09-24 DIAGNOSIS — T380X5A Adverse effect of glucocorticoids and synthetic analogues, initial encounter: Secondary | ICD-10-CM | POA: Diagnosis present

## 2015-09-24 DIAGNOSIS — Z8711 Personal history of peptic ulcer disease: Secondary | ICD-10-CM | POA: Diagnosis not present

## 2015-09-24 DIAGNOSIS — Z7951 Long term (current) use of inhaled steroids: Secondary | ICD-10-CM

## 2015-09-24 DIAGNOSIS — J9622 Acute and chronic respiratory failure with hypercapnia: Secondary | ICD-10-CM | POA: Diagnosis not present

## 2015-09-24 DIAGNOSIS — R531 Weakness: Secondary | ICD-10-CM | POA: Diagnosis not present

## 2015-09-24 DIAGNOSIS — Z8550 Personal history of malignant neoplasm of unspecified urinary tract organ: Secondary | ICD-10-CM

## 2015-09-24 DIAGNOSIS — J441 Chronic obstructive pulmonary disease with (acute) exacerbation: Secondary | ICD-10-CM | POA: Diagnosis not present

## 2015-09-24 DIAGNOSIS — R404 Transient alteration of awareness: Secondary | ICD-10-CM | POA: Diagnosis not present

## 2015-09-24 DIAGNOSIS — R0682 Tachypnea, not elsewhere classified: Secondary | ICD-10-CM | POA: Diagnosis not present

## 2015-09-24 DIAGNOSIS — K219 Gastro-esophageal reflux disease without esophagitis: Secondary | ICD-10-CM | POA: Diagnosis present

## 2015-09-24 DIAGNOSIS — Z7982 Long term (current) use of aspirin: Secondary | ICD-10-CM

## 2015-09-24 DIAGNOSIS — E872 Acidosis: Secondary | ICD-10-CM | POA: Diagnosis not present

## 2015-09-24 DIAGNOSIS — E785 Hyperlipidemia, unspecified: Secondary | ICD-10-CM | POA: Diagnosis present

## 2015-09-24 DIAGNOSIS — A419 Sepsis, unspecified organism: Secondary | ICD-10-CM | POA: Diagnosis not present

## 2015-09-24 DIAGNOSIS — R5383 Other fatigue: Secondary | ICD-10-CM | POA: Diagnosis present

## 2015-09-24 DIAGNOSIS — Z9981 Dependence on supplemental oxygen: Secondary | ICD-10-CM | POA: Diagnosis not present

## 2015-09-24 DIAGNOSIS — L89610 Pressure ulcer of right heel, unstageable: Secondary | ICD-10-CM | POA: Diagnosis present

## 2015-09-24 DIAGNOSIS — E7800 Pure hypercholesterolemia, unspecified: Secondary | ICD-10-CM | POA: Diagnosis present

## 2015-09-24 DIAGNOSIS — J9611 Chronic respiratory failure with hypoxia: Secondary | ICD-10-CM | POA: Diagnosis not present

## 2015-09-24 DIAGNOSIS — J9690 Respiratory failure, unspecified, unspecified whether with hypoxia or hypercapnia: Secondary | ICD-10-CM | POA: Diagnosis present

## 2015-09-24 DIAGNOSIS — Z825 Family history of asthma and other chronic lower respiratory diseases: Secondary | ICD-10-CM

## 2015-09-24 DIAGNOSIS — Z7952 Long term (current) use of systemic steroids: Secondary | ICD-10-CM

## 2015-09-24 DIAGNOSIS — J9621 Acute and chronic respiratory failure with hypoxia: Secondary | ICD-10-CM | POA: Diagnosis not present

## 2015-09-24 DIAGNOSIS — L8990 Pressure ulcer of unspecified site, unspecified stage: Secondary | ICD-10-CM | POA: Insufficient documentation

## 2015-09-24 DIAGNOSIS — N400 Enlarged prostate without lower urinary tract symptoms: Secondary | ICD-10-CM | POA: Diagnosis present

## 2015-09-24 DIAGNOSIS — S0990XA Unspecified injury of head, initial encounter: Secondary | ICD-10-CM | POA: Diagnosis not present

## 2015-09-24 DIAGNOSIS — J449 Chronic obstructive pulmonary disease, unspecified: Secondary | ICD-10-CM | POA: Diagnosis not present

## 2015-09-24 DIAGNOSIS — D649 Anemia, unspecified: Secondary | ICD-10-CM | POA: Diagnosis present

## 2015-09-24 DIAGNOSIS — R51 Headache: Secondary | ICD-10-CM | POA: Diagnosis not present

## 2015-09-24 DIAGNOSIS — F17210 Nicotine dependence, cigarettes, uncomplicated: Secondary | ICD-10-CM | POA: Diagnosis not present

## 2015-09-24 DIAGNOSIS — Z841 Family history of disorders of kidney and ureter: Secondary | ICD-10-CM | POA: Diagnosis not present

## 2015-09-24 DIAGNOSIS — R739 Hyperglycemia, unspecified: Secondary | ICD-10-CM | POA: Diagnosis present

## 2015-09-24 DIAGNOSIS — R296 Repeated falls: Secondary | ICD-10-CM | POA: Diagnosis present

## 2015-09-24 LAB — URINALYSIS, ROUTINE W REFLEX MICROSCOPIC
BILIRUBIN URINE: NEGATIVE
GLUCOSE, UA: NEGATIVE mg/dL
KETONES UR: NEGATIVE mg/dL
LEUKOCYTES UA: NEGATIVE
NITRITE: NEGATIVE
PH: 7 (ref 5.0–8.0)
Protein, ur: NEGATIVE mg/dL
SPECIFIC GRAVITY, URINE: 1.01 (ref 1.005–1.030)

## 2015-09-24 LAB — COMPREHENSIVE METABOLIC PANEL
ALBUMIN: 3.3 g/dL — AB (ref 3.5–5.0)
ALT: 12 U/L — AB (ref 17–63)
AST: 15 U/L (ref 15–41)
Alkaline Phosphatase: 71 U/L (ref 38–126)
Anion gap: 5 (ref 5–15)
BUN: 7 mg/dL (ref 6–20)
CHLORIDE: 96 mmol/L — AB (ref 101–111)
CO2: 37 mmol/L — ABNORMAL HIGH (ref 22–32)
CREATININE: 0.74 mg/dL (ref 0.61–1.24)
Calcium: 8.7 mg/dL — ABNORMAL LOW (ref 8.9–10.3)
GFR calc Af Amer: >60 mL/min (ref >60)
GFR calc non Af Amer: >60 mL/min (ref >60)
GLUCOSE: 108 mg/dL — AB (ref 65–99)
POTASSIUM: 4 mmol/L (ref 3.5–5.1)
Sodium: 138 mmol/L (ref 135–145)
Total Bilirubin: 0.3 mg/dL (ref 0.3–1.2)
Total Protein: 6.5 g/dL (ref 6.5–8.1)

## 2015-09-24 LAB — CBC
HEMATOCRIT: 35.4 % — AB (ref 39.0–52.0)
HEMOGLOBIN: 11.5 g/dL — AB (ref 13.0–17.0)
MCH: 30.7 pg (ref 26.0–34.0)
MCHC: 32.5 g/dL (ref 30.0–36.0)
MCV: 94.7 fL (ref 78.0–100.0)
Platelets: 353 10*3/uL (ref 150–400)
RBC: 3.74 MIL/uL — ABNORMAL LOW (ref 4.22–5.81)
RDW: 14.7 % (ref 11.5–15.5)
WBC: 12.2 10*3/uL — ABNORMAL HIGH (ref 4.0–10.5)

## 2015-09-24 LAB — BLOOD GAS, ARTERIAL
Acid-Base Excess: 9.6 mmol/L — ABNORMAL HIGH (ref 0.0–2.0)
Bicarbonate: 31.9 mEq/L — ABNORMAL HIGH (ref 20.0–24.0)
Drawn by: 22223
O2 Content: 2.5 L/min
O2 Saturation: 94.8 %
PCO2 ART: 66.1 mmHg — AB (ref 35.0–45.0)
PH ART: 7.348 — AB (ref 7.350–7.450)
PO2 ART: 75.8 mmHg — AB (ref 80.0–100.0)
TCO2: 14.6 mmol/L (ref 0–100)

## 2015-09-24 LAB — CBG MONITORING, ED: Glucose-Capillary: 125 mg/dL — ABNORMAL HIGH (ref 65–99)

## 2015-09-24 LAB — URINE MICROSCOPIC-ADD ON

## 2015-09-24 MED ORDER — ASPIRIN EC 81 MG PO TBEC
81.0000 mg | DELAYED_RELEASE_TABLET | Freq: Every morning | ORAL | Status: DC
  Administered 2015-09-25 – 2015-09-28 (×4): 81 mg via ORAL
  Filled 2015-09-24 (×4): qty 1

## 2015-09-24 MED ORDER — ALBUTEROL SULFATE (2.5 MG/3ML) 0.083% IN NEBU
5.0000 mg | INHALATION_SOLUTION | Freq: Once | RESPIRATORY_TRACT | Status: AC
  Administered 2015-09-24: 5 mg via RESPIRATORY_TRACT
  Filled 2015-09-24: qty 6

## 2015-09-24 MED ORDER — VITAMIN B-12 1000 MCG PO TABS
2500.0000 ug | ORAL_TABLET | Freq: Every day | ORAL | Status: DC
  Administered 2015-09-26 – 2015-09-28 (×3): 2500 ug via ORAL
  Filled 2015-09-24 (×3): qty 3

## 2015-09-24 MED ORDER — ENOXAPARIN SODIUM 40 MG/0.4ML ~~LOC~~ SOLN
40.0000 mg | SUBCUTANEOUS | Status: DC
  Administered 2015-09-24 – 2015-09-27 (×3): 40 mg via SUBCUTANEOUS
  Filled 2015-09-24 (×3): qty 0.4

## 2015-09-24 MED ORDER — SODIUM CHLORIDE 0.9 % IV BOLUS (SEPSIS)
500.0000 mL | Freq: Once | INTRAVENOUS | Status: AC
  Administered 2015-09-24: 500 mL via INTRAVENOUS

## 2015-09-24 MED ORDER — SODIUM CHLORIDE 0.9% FLUSH
3.0000 mL | Freq: Two times a day (BID) | INTRAVENOUS | Status: DC
  Administered 2015-09-24 – 2015-09-28 (×7): 3 mL via INTRAVENOUS

## 2015-09-24 MED ORDER — LORATADINE 10 MG PO TABS
10.0000 mg | ORAL_TABLET | Freq: Every day | ORAL | Status: DC
  Administered 2015-09-25 – 2015-09-28 (×4): 10 mg via ORAL
  Filled 2015-09-24 (×4): qty 1

## 2015-09-24 MED ORDER — VITAMIN D3 25 MCG (1000 UNIT) PO TABS
1000.0000 [IU] | ORAL_TABLET | Freq: Every morning | ORAL | Status: DC
  Administered 2015-09-25 – 2015-09-28 (×4): 1000 [IU] via ORAL
  Filled 2015-09-24 (×9): qty 1

## 2015-09-24 MED ORDER — ALBUTEROL SULFATE (2.5 MG/3ML) 0.083% IN NEBU
2.5000 mg | INHALATION_SOLUTION | RESPIRATORY_TRACT | Status: DC | PRN
  Administered 2015-09-25: 2.5 mg via RESPIRATORY_TRACT
  Filled 2015-09-24: qty 3

## 2015-09-24 MED ORDER — DULOXETINE HCL 30 MG PO CPEP
30.0000 mg | ORAL_CAPSULE | Freq: Two times a day (BID) | ORAL | Status: DC
  Administered 2015-09-24 – 2015-09-28 (×8): 30 mg via ORAL
  Filled 2015-09-24 (×8): qty 1

## 2015-09-24 MED ORDER — ADULT MULTIVITAMIN W/MINERALS CH
1.0000 | ORAL_TABLET | Freq: Every day | ORAL | Status: DC
  Administered 2015-09-25 – 2015-09-28 (×4): 1 via ORAL
  Filled 2015-09-24 (×4): qty 1

## 2015-09-24 MED ORDER — ACETAMINOPHEN 650 MG RE SUPP: 650.0000 mg | Freq: Four times a day (QID) | RECTAL | Status: DC | PRN

## 2015-09-24 MED ORDER — MOMETASONE FURO-FORMOTEROL FUM 200-5 MCG/ACT IN AERO
2.0000 | INHALATION_SPRAY | Freq: Two times a day (BID) | RESPIRATORY_TRACT | Status: DC
  Filled 2015-09-24: qty 8.8

## 2015-09-24 MED ORDER — METHYLPREDNISOLONE SODIUM SUCC 125 MG IJ SOLR
125.0000 mg | Freq: Once | INTRAMUSCULAR | Status: AC
  Administered 2015-09-24: 125 mg via INTRAVENOUS
  Filled 2015-09-24: qty 2

## 2015-09-24 MED ORDER — DOCUSATE SODIUM 100 MG PO CAPS: 100.0000 mg | ORAL_CAPSULE | Freq: Every day | ORAL | Status: DC | PRN

## 2015-09-24 MED ORDER — SIMVASTATIN 20 MG PO TABS
40.0000 mg | ORAL_TABLET | Freq: Every morning | ORAL | Status: DC
  Administered 2015-09-25 – 2015-09-28 (×4): 40 mg via ORAL
  Filled 2015-09-24 (×4): qty 2

## 2015-09-24 MED ORDER — ACETAMINOPHEN 325 MG PO TABS
650.0000 mg | ORAL_TABLET | Freq: Four times a day (QID) | ORAL | Status: DC | PRN
  Administered 2015-09-25 – 2015-09-26 (×2): 650 mg via ORAL
  Filled 2015-09-24 (×2): qty 2

## 2015-09-24 MED ORDER — ROFLUMILAST 500 MCG PO TABS
500.0000 ug | ORAL_TABLET | Freq: Every day | ORAL | Status: DC
  Administered 2015-09-25 – 2015-09-28 (×4): 500 ug via ORAL
  Filled 2015-09-24 (×4): qty 1

## 2015-09-24 MED ORDER — GARLIC 1000 MG PO CAPS: 1.0000 | ORAL_CAPSULE | Freq: Every morning | ORAL | Status: DC

## 2015-09-24 MED ORDER — TIOTROPIUM BROMIDE MONOHYDRATE 18 MCG IN CAPS
1.0000 | ORAL_CAPSULE | Freq: Every day | RESPIRATORY_TRACT | Status: DC
  Filled 2015-09-24: qty 5

## 2015-09-24 MED ORDER — PANTOPRAZOLE SODIUM 40 MG PO TBEC
40.0000 mg | DELAYED_RELEASE_TABLET | Freq: Every day | ORAL | Status: DC
  Administered 2015-09-25 – 2015-09-28 (×4): 40 mg via ORAL
  Filled 2015-09-24 (×4): qty 1

## 2015-09-24 MED ORDER — ALPRAZOLAM 0.5 MG PO TABS
0.5000 mg | ORAL_TABLET | Freq: Two times a day (BID) | ORAL | Status: DC | PRN
  Administered 2015-09-24: 1 mg via ORAL
  Administered 2015-09-25: 0.5 mg via ORAL
  Administered 2015-09-26: 1 mg via ORAL
  Filled 2015-09-24 (×2): qty 2
  Filled 2015-09-24: qty 1

## 2015-09-24 NOTE — ED Provider Notes (Signed)
CSN: [id number]     Arrival date & time 09/24/15  1910 History   First MD Initiated Contact with Patient 09/24/15 1925     Chief Complaint  Patient presents with  . Fatigue     (Consider location/radiation/quality/duration/timing/severity/associated sxs/prior Treatment) HPI 75 year old man comes in today complaining of weakness and inability to walk secondary to this. He states that he has been falling frequently. He has some skin tears on his arms but denies any other injury. He states that this has been worsening over the past week. He denies headache, head injury, chest pain, dyspnea, nausea, vomiting, diarrhea, fever, chills, or lateralized weakness. Past Medical History  Diagnosis Date  . COPD (chronic obstructive pulmonary disease) ([place])   . Asthma   . Tobacco abuse 09/29/2011  . Hypercholesteremia   . GERD (gastroesophageal reflux disease)   . Headache(784.0)   . BPH (benign prostatic hyperplasia) 10/01/2011    Per cystoscopy  . 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  . Pulmonary nodule 09/2011    Stable appearance  . Chronic back pain   . Chronic anxiety   . Hyperlipidemia 03/13/2012  . Anxiety 03/13/2012  . On home O2     2L N/C continuous  . Emphysema lung ([place])   . Depression   . History of stomach ulcers    Past Surgical History  Procedure Laterality Date  . Cystoscopy  09/30/2011    Procedure: CYSTOSCOPY FLEXIBLE;  Surgeon: Marissa Nestle, MD;  Location: [facility];  Service: Urology;  Laterality: N/A;  . Transurethral resection of bladder tumor  10/01/2011    Procedure: TRANSURETHRAL RESECTION OF BLADDER TUMOR (TURBT);  Surgeon: Marissa Nestle, MD;  Location: [facility];  Service: Urology;  Laterality: N/A;  . Cardiac catheterization  2003    [facility]  . Fracture surgery      R ring finger, pin placed   Family History  Problem Relation Age of Onset  . Emphysema Brother     was not close to family; does not truly know their medical problems.   . Emphysema Brother   . Emphysema Brother   . Cancer Neg Hx    Social History  Substance Use Topics  . Smoking status: Current Every Day Smoker -- 1.50 packs/day for 58 years    Types: Cigarettes    Last Attempt to Quit: 02/17/2014  . Smokeless tobacco: None  . Alcohol Use: No    Review of Systems  All other systems reviewed and are negative.     Allergies  Iohexol and Ivp dye  Home Medications   Prior to Admission medications   Medication Sig Start Date End Date Taking? Authorizing Provider  albuterol (PROVENTIL) (2.5 MG/3ML) 0.083% nebulizer solution Take 2.5 mg by nebulization every 4 (four) hours as needed. For shortness of breath    Historical Provider, MD  ALPRAZolam Duanne Moron) 1 MG tablet Take 0.5-1 mg by mouth 2 (two) times daily as needed for anxiety. anxiety 02/20/13   Domenic Polite, MD  aspirin EC 81 MG tablet Take 81 mg by mouth every morning.  10/02/11   Rexene Alberts, MD  budesonide-formoterol [facility]) 160-4.5 MCG/ACT inhaler Inhale 2 puffs into the lungs 2 (two) times daily. 03/30/13   Rexene Alberts, MD  Cholecalciferol (VITAMIN D3) 1000 UNITS CAPS Take 1 capsule by mouth every morning.     Historical Provider, MD  citalopram (CELEXA) 20 MG tablet Take 1 tablet by mouth daily. 08/14/15   Historical Provider, MD  Cyanocobalamin (B-12)  2500 MCG TABS Take 1 tablet by mouth daily.    Historical Provider, MD  DALIRESP 500 MCG TABS tablet Take 500 mcg by mouth daily. 01/26/14   Historical Provider, MD  docusate sodium (COLACE) 100 MG capsule Take 100 mg by mouth 2 (two) times daily.    Historical Provider, MD  DULoxetine (CYMBALTA) 60 MG capsule Take 1 capsule (60 mg total) by mouth daily. 09/18/15   Fransisca Kaufmann Dettinger, MD  esomeprazole (NEXIUM) 40 MG capsule Take 40 mg by mouth every morning.    Historical Provider, MD  Garlic 123XX123 MG CAPS Take 1 capsule by mouth every morning.     Historical Provider, MD  loratadine (CLARITIN) 10 MG tablet Take 10 mg by mouth daily.     Historical Provider, MD  Multiple Vitamin (MULTIVITAMIN WITH MINERALS) TABS tablet Take 1 tablet by mouth daily.    Historical Provider, MD  NITROSTAT 0.4 MG SL tablet Place 1 tablet under the tongue every 5 (five) minutes as needed for chest pain.  02/22/13   Historical Provider, MD  predniSONE (DELTASONE) 10 MG tablet Take 1 tablet (10 mg total) by mouth daily with breakfast. Take 6 tablets today and then decrease by 1 tablet daily until none are left. 08/22/15   Erline Hau, MD  simvastatin (ZOCOR) 40 MG tablet Take 40 mg by mouth every morning.     Historical Provider, MD  SPIRIVA HANDIHALER 18 MCG inhalation capsule Place 1 capsule into inhaler and inhale daily. 08/14/15   Historical Provider, MD   BP 116/80 mmHg  Pulse 84  Temp(Src) 98.2 F (36.8 C) (Rectal)  Resp 16  Ht 5\' 4"  (1.626 m)  Wt 72.576 kg  BMI 27.45 kg/m2  SpO2 96% Physical Exam  Constitutional: He is oriented to person, place, and time. He appears well-developed and well-nourished. No distress.  HENT:  Head: Normocephalic and atraumatic.  Right Ear: External ear normal.  Nose: Nose normal.  Mouth/Throat: Oropharynx is clear and moist.  Eyes: Pupils are equal, round, and reactive to light.  Neck: Normal range of motion. Neck supple.  Cardiovascular: Normal rate and regular rhythm.   Pulmonary/Chest: He has wheezes.  Abdominal: Soft. Bowel sounds are normal.  Musculoskeletal: Normal range of motion.  Neurological: He is alert and oriented to person, place, and time. No cranial nerve deficit. Coordination normal.  Patient with strength equal but generally appears weak. Finger to nose normal bilaterally but patient does have difficulty holding himself up into a sitting position  Skin: Skin is warm.  Nursing note and vitals reviewed.   ED Course  Procedures (including critical care time) Labs Review Labs Reviewed  CBC - Abnormal; Notable for the following:    WBC 12.2 (*)    RBC 3.74 (*)    Hemoglobin  11.5 (*)    HCT 35.4 (*)    All other components within normal limits  CBG MONITORING, ED - Abnormal; Notable for the following:    Glucose-Capillary 125 (*)    All other components within normal limits  URINALYSIS, ROUTINE W REFLEX MICROSCOPIC (NOT AT [facility])  COMPREHENSIVE METABOLIC PANEL  BLOOD GAS, ARTERIAL    Imaging Review Dg Chest 1 View  09/24/2015  CLINICAL DATA:  Acute onset of generalized weakness and falls. Initial encounter. EXAM: CHEST 1 VIEW COMPARISON:  Chest radiograph performed 08/18/2015 FINDINGS: The lungs are well-aerated. Vascular congestion is noted. Mildly increased interstitial markings may reflect mild interstitial edema. There is no evidence of pleural effusion or pneumothorax. The  cardiomediastinal silhouette is within normal limits. No acute osseous abnormalities are seen. IMPRESSION: Vascular congestion noted. Mildly increased interstitial markings may reflect mild interstitial edema. Electronically Signed   By: Garald Balding M.D.   On: 09/24/2015 20:27   I have personally reviewed and evaluated these images and lab results as part of my medical decision-making.   EKG Interpretation   Date/Time:  Sunday September 24 2015 19:28:26 EDT Ventricular Rate:  88 PR Interval:  190 QRS Duration: 73 QT Interval:  362 QTC Calculation: 438 R Axis:   97 Text Interpretation:  Sinus rhythm Right axis deviation Abnormal R-wave  progression, early transition Confirmed by Yatzil Clippinger MD, Andee Poles [id number]) on  09/24/2015 7:52:18 PM      MDM   Final diagnoses:  Acute on chronic respiratory failure with hypercapnia (HCC)   1-weakness/ multiple falls- ?secondary to #2.  No focal deficits but difficulty with sitting on own- patient not ambulated.  2-hypercarbic respiratory failure-no evidence of infiltrate.  Diffuse wheezing- nebs and steroids given.   Plan bipap and admission to step down  Pattricia Boss, MD 09/24/15 2143

## 2015-09-24 NOTE — ED Notes (Signed)
SKin tear noted to right upper arm and left forearm.

## 2015-09-24 NOTE — ED Notes (Signed)
Patient brought in by [facility] from home. Reports in generalized weakness and increased falls. Complains of back pain. Has several skin tears to left arm. EMS reports of giving albuterol treatment x1 PTA. Patient denies shortness of breath.

## 2015-09-24 NOTE — ED Notes (Signed)
CRITICAL VALUE ALERT  Critical value received: CO2 66 Date of notification:  09/24/15 Time of notification:  2112 Critical value read back:Yes.    Nurse who received alert:GMP MD notified (1st page): Dr. Jeanell Sparrow Time of first page:2112 Responding MD: Dr. Jeanell Sparrow Time MD responded: 2112

## 2015-09-24 NOTE — H&P (Signed)
TRH H&P   Patient Demographics:    [name] [name], is a 75 y.o. male  MRN: [id number]   DOB - Jan 03, 1941  Admit Date - 09/24/2015  Outpatient Primary MD for the patient is Kenn File, MD  Referring MD/NP/PA: Pilar Jarvis  Outpatient Specialists:    Patient coming from: home  Chief Complaint  Patient presents with  . Fatigue      HPI:    [name] [name]  is a 75 y.o. male, Copd on home o2, 2Lnc apparently complains of weakness, and has had recent falls at home.  Pt notes slight increase in dyspnea and wheezing.  In ED, pt has found to have hypercarbic respiratory failure.  co2 66. 1.    Pt will be admitted for Copd exacerbation, hypercarbic respiratory failure.      Review of systems:    In addition to the HPI above, No Fever-chills, No Headache, No changes with Vision or hearing, No problems swallowing food or Liquids, No Chest pain, Cough  No Abdominal pain, No Nausea or Vommitting, Bowel movements are regular, No Blood in stool or Urine, No dysuria, No new skin rashes or bruises, No new joints pains-aches,  No new  tingling, numbness in any extremity, No recent weight gain or loss, No polyuria, polydypsia or polyphagia, No significant Mental Stressors.  A full 10 point Review of Systems was done, except as stated above, all other Review of Systems were negative.   With Past History of the following :    Past Medical History  Diagnosis Date  . COPD (chronic obstructive pulmonary disease) ([place])   . Asthma   . Tobacco abuse 09/29/2011  . Hypercholesteremia   . GERD (gastroesophageal reflux disease)   . Headache(784.0)   . BPH (benign prostatic hyperplasia) 10/01/2011    Per cystoscopy  . 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  . Pulmonary nodule 09/2011    Stable appearance  . Chronic back pain   . Chronic anxiety   .  Hyperlipidemia 03/13/2012  . Anxiety 03/13/2012  . On home O2     2L N/C continuous  . Emphysema lung ([place])   . Depression   . History of stomach ulcers       Past Surgical History  Procedure Laterality Date  . Cystoscopy  09/30/2011    Procedure: CYSTOSCOPY FLEXIBLE;  Surgeon: Marissa Nestle, MD;  Location: [facility];  Service: Urology;  Laterality: N/A;  . Transurethral resection of bladder tumor  10/01/2011    Procedure: TRANSURETHRAL RESECTION OF BLADDER TUMOR (TURBT);  Surgeon: Marissa Nestle, MD;  Location: [facility];  Service: Urology;  Laterality: N/A;  . Cardiac catheterization  2003    [facility]  . Fracture surgery      R ring finger, pin placed  . Cholecystectomy        Social History:  Social History  Substance Use Topics  . Smoking status: Current Every Day Smoker -- 1.50 packs/day for 58 years    Types: Cigarettes    Last Attempt to Quit: 02/17/2014  . Smokeless tobacco: Not on file  . Alcohol Use: No     Lives - at home  Mobility - normal    Family History :     Family History  Problem Relation Age of Onset  . Emphysema Brother     was not close to family; does not truly know their medical problems.  . Emphysema Brother   . Emphysema Brother   . Cancer Neg Hx   . Kidney failure Mother      Home Medications:   Prior to Admission medications   Medication Sig Start Date End Date Taking? Authorizing Provider  albuterol (PROVENTIL) (2.5 MG/3ML) 0.083% nebulizer solution Take 2.5 mg by nebulization every 4 (four) hours as needed. For shortness of breath    Historical Provider, MD  ALPRAZolam Duanne Moron) 1 MG tablet Take 0.5-1 mg by mouth 2 (two) times daily as needed for anxiety. anxiety 02/20/13   Domenic Polite, MD  aspirin EC 81 MG tablet Take 81 mg by mouth every morning.  10/02/11   Rexene Alberts, MD  budesonide-formoterol [facility]) 160-4.5 MCG/ACT inhaler Inhale 2 puffs into the lungs 2 (two) times daily. 03/30/13   Rexene Alberts, MD    Cholecalciferol (VITAMIN D3) 1000 UNITS CAPS Take 1 capsule by mouth every morning.     Historical Provider, MD  citalopram (CELEXA) 20 MG tablet Take 1 tablet by mouth daily. 08/14/15   Historical Provider, MD  Cyanocobalamin (B-12) 2500 MCG TABS Take 1 tablet by mouth daily.    Historical Provider, MD  DALIRESP 500 MCG TABS tablet Take 500 mcg by mouth daily. 01/26/14   Historical Provider, MD  docusate sodium (COLACE) 100 MG capsule Take 100 mg by mouth 2 (two) times daily.    Historical Provider, MD  DULoxetine (CYMBALTA) 60 MG capsule Take 1 capsule (60 mg total) by mouth daily. 09/18/15   Fransisca Kaufmann Dettinger, MD  esomeprazole (NEXIUM) 40 MG capsule Take 40 mg by mouth every morning.    Historical Provider, MD  Garlic 123XX123 MG CAPS Take 1 capsule by mouth every morning.     Historical Provider, MD  loratadine (CLARITIN) 10 MG tablet Take 10 mg by mouth daily.    Historical Provider, MD  Multiple Vitamin (MULTIVITAMIN WITH MINERALS) TABS tablet Take 1 tablet by mouth daily.    Historical Provider, MD  NITROSTAT 0.4 MG SL tablet Place 1 tablet under the tongue every 5 (five) minutes as needed for chest pain.  02/22/13   Historical Provider, MD  predniSONE (DELTASONE) 10 MG tablet Take 1 tablet (10 mg total) by mouth daily with breakfast. Take 6 tablets today and then decrease by 1 tablet daily until none are left. 08/22/15   Erline Hau, MD  simvastatin (ZOCOR) 40 MG tablet Take 40 mg by mouth every morning.     Historical Provider, MD  SPIRIVA HANDIHALER 18 MCG inhalation capsule Place 1 capsule into inhaler and inhale daily. 08/14/15   Historical Provider, MD     Allergies:     Allergies  Allergen Reactions  . Iohexol Hives     Code: HIVES, Desc: PT STATES HE BROKE OUT IN HIVES AND RASH AFTER CT NECK EARLY SEPT 2011; NO RESP PROBLEMS; NEEDS PRE-MEDS; MKS, Onset Date: [id number]   . Ivp Dye [Iodinated Diagnostic Agents] Hives  Physical Exam:   Vitals  Blood pressure 110/79,  pulse 86, temperature 98.2 F (36.8 C), temperature source Rectal, resp. rate 16, height 5\' 4"  (1.626 m), weight 72.576 kg (160 lb), SpO2 96 %.   1. General  lying in bed in NAD,   2. Normal affect and insight, Not Suicidal or Homicidal, Awake Alert, Oriented X 3.  3. No F.N deficits, ALL C.Nerves Intact, Strength 5/5 all 4 extremities, Sensation intact all 4 extremities, Plantars down going.  4. Ears and Eyes appear Normal, Conjunctivae clear, PERRLA. Moist Oral Mucosa.  5. Supple Neck, No JVD, No cervical lymphadenopathy appriciated, No Carotid Bruits.  6. Symmetrical Chest wall movement, Good air movement bilaterally, + wheezing.  7. RRR, No Gallops, Rubs or Murmurs, No Parasternal Heave.  8. Positive Bowel Sounds, Abdomen Soft, No tenderness, No organomegaly appriciated,No rebound -guarding or rigidity.  9.  No Cyanosis, Normal Skin Turgor, No Skin Rash or Bruise.  10. Good muscle tone,  joints appear normal , no effusions, Normal ROM.  11. No Palpable Lymph Nodes in Neck or Axillae     Data Review:    CBC  Recent Labs Lab 09/24/15 1953  WBC 12.2*  HGB 11.5*  HCT 35.4*  PLT 353  MCV 94.7  MCH 30.7  MCHC 32.5  RDW 14.7   ------------------------------------------------------------------------------------------------------------------  Chemistries   Recent Labs Lab 09/24/15 1953  NA 138  K 4.0  CL 96*  CO2 37*  GLUCOSE 108*  BUN 7  CREATININE 0.74  CALCIUM 8.7*  AST 15  ALT 12*  ALKPHOS 71  BILITOT 0.3   ------------------------------------------------------------------------------------------------------------------ estimated creatinine clearance is 75.1 mL/min (by C-G formula based on Cr of 0.74). ------------------------------------------------------------------------------------------------------------------ No results for input(s): TSH, T4TOTAL, T3FREE, THYROIDAB in the last 72 hours.  Invalid input(s): FREET3  Coagulation profile No  results for input(s): INR, PROTIME in the last 168 hours. ------------------------------------------------------------------------------------------------------------------- No results for input(s): DDIMER in the last 72 hours. -------------------------------------------------------------------------------------------------------------------  Cardiac Enzymes No results for input(s): CKMB, TROPONINI, MYOGLOBIN in the last 168 hours.  Invalid input(s): CK ------------------------------------------------------------------------------------------------------------------ No results found for: BNP   ---------------------------------------------------------------------------------------------------------------  Urinalysis    Component Value Date/Time   COLORURINE YELLOW 09/24/2015 1951   APPEARANCEUR CLEAR 09/24/2015 1951   LABSPEC 1.010 09/24/2015 1951   PHURINE 7.0 09/24/2015 1951   GLUCOSEU NEGATIVE 09/24/2015 1951   HGBUR TRACE* 09/24/2015 1951   BILIRUBINUR NEGATIVE 09/24/2015 1951   KETONESUR NEGATIVE 09/24/2015 1951   PROTEINUR NEGATIVE 09/24/2015 1951   UROBILINOGEN 0.2 05/04/2014 0956   NITRITE NEGATIVE 09/24/2015 1951   LEUKOCYTESUR NEGATIVE 09/24/2015 1951    ----------------------------------------------------------------------------------------------------------------   Imaging Results:    Dg Chest 1 View  09/24/2015  CLINICAL DATA:  Acute onset of generalized weakness and falls. Initial encounter. EXAM: CHEST 1 VIEW COMPARISON:  Chest radiograph performed 08/18/2015 FINDINGS: The lungs are well-aerated. Vascular congestion is noted. Mildly increased interstitial markings may reflect mild interstitial edema. There is no evidence of pleural effusion or pneumothorax. The cardiomediastinal silhouette is within normal limits. No acute osseous abnormalities are seen. IMPRESSION: Vascular congestion noted. Mildly increased interstitial markings may reflect mild interstitial  edema. Electronically Signed   By: Garald Balding M.D.   On: 09/24/2015 20:27   Ct Head Wo Contrast  09/24/2015  CLINICAL DATA:  Acute onset of generalized weakness and falls. Initial encounter. EXAM: CT HEAD WITHOUT CONTRAST TECHNIQUE: Contiguous axial images were obtained from the base of the skull through the vertex without intravenous contrast. COMPARISON:  Images of the  head from the PET/CT performed 01/09/2010 FINDINGS: There is no evidence of acute infarction, mass lesion, or intra- or extra-axial hemorrhage on CT. Prominence of the ventricles and sulci reflects mild cortical volume loss. Mild cerebellar atrophy is noted. Mild periventricular white matter change likely reflects small vessel ischemic microangiopathy. The brainstem and fourth ventricle are within normal limits. The basal ganglia are unremarkable in appearance. The cerebral hemispheres demonstrate grossly normal gray-white differentiation. No mass effect or midline shift is seen. There is no evidence of fracture; visualized osseous structures are unremarkable in appearance. The orbits are within normal limits. The paranasal sinuses and mastoid air cells are well-aerated. No significant soft tissue abnormalities are seen. IMPRESSION: 1. No acute intracranial pathology seen on CT. 2. Mild cortical volume loss and scattered small vessel ischemic microangiopathy. Electronically Signed   By: Garald Balding M.D.   On: 09/24/2015 21:02       Assessment & Plan:    Active Problems:   COPD exacerbation ([place])   Respiratory failure ([place])    1. Hypercarbic respiratory failure Secondary to Copd exacerbation Solumedrol 80mg  iv tid levaquin 500mg  iv qday spiriva 1puff qday Albuterol 1 neb q6h and q6h prn  Bipap per respiratory protocol  abg in am  2. Anemia Check cbc in am  3. Generalized weakness PT to evaluate and tx  4. Hyperglycemia Check hga1c   DVT Prophylaxis  lovenox , SCD  AM Labs Ordered, also please review Full  Orders  Family Communication: Admission, patients condition and plan of care including tests being ordered have been discussed with the patient  who indicate understanding and agree with the plan and Code Status.  Code Status FULL CODE  Likely DC to   Condition GUARDED    Consults called:   Admission status: inpatient  Time spent in minutes : 45 minutes   Jani Gravel M.D on 09/24/2015 at 10:21 PM  Between 7am to 7pm - Pager - [phone]. After 7pm go to [email or web address] - password [facility]  [facility] - Office  [phone]

## 2015-09-24 NOTE — ED Notes (Signed)
Nurse unavailable for report.  

## 2015-09-25 ENCOUNTER — Ambulatory Visit: Payer: Commercial Managed Care - HMO | Admitting: Family Medicine

## 2015-09-25 DIAGNOSIS — L8990 Pressure ulcer of unspecified site, unspecified stage: Secondary | ICD-10-CM | POA: Insufficient documentation

## 2015-09-25 LAB — COMPREHENSIVE METABOLIC PANEL
ALT: 16 U/L — ABNORMAL LOW (ref 17–63)
ANION GAP: 6 (ref 5–15)
AST: 19 U/L (ref 15–41)
Albumin: 3.4 g/dL — ABNORMAL LOW (ref 3.5–5.0)
Alkaline Phosphatase: 74 U/L (ref 38–126)
BUN: 7 mg/dL (ref 6–20)
CALCIUM: 8.8 mg/dL — AB (ref 8.9–10.3)
CO2: 32 mmol/L (ref 22–32)
CREATININE: 0.66 mg/dL (ref 0.61–1.24)
Chloride: 96 mmol/L — ABNORMAL LOW (ref 101–111)
GFR calc Af Amer: >60 mL/min (ref >60)
GFR calc non Af Amer: >60 mL/min (ref >60)
GLUCOSE: 168 mg/dL — AB (ref 65–99)
Potassium: 4.3 mmol/L (ref 3.5–5.1)
SODIUM: 134 mmol/L — AB (ref 135–145)
Total Bilirubin: 0.4 mg/dL (ref 0.3–1.2)
Total Protein: 6.8 g/dL (ref 6.5–8.1)

## 2015-09-25 LAB — CBC
HCT: 35 % — ABNORMAL LOW (ref 39.0–52.0)
HEMOGLOBIN: 11.3 g/dL — AB (ref 13.0–17.0)
MCH: 30.4 pg (ref 26.0–34.0)
MCHC: 32.3 g/dL (ref 30.0–36.0)
MCV: 94.1 fL (ref 78.0–100.0)
Platelets: 357 10*3/uL (ref 150–400)
RBC: 3.72 MIL/uL — ABNORMAL LOW (ref 4.22–5.81)
RDW: 14.4 % (ref 11.5–15.5)
WBC: 11.3 10*3/uL — ABNORMAL HIGH (ref 4.0–10.5)

## 2015-09-25 LAB — MRSA PCR SCREENING: MRSA by PCR: NEGATIVE

## 2015-09-25 MED ORDER — ALBUTEROL SULFATE (2.5 MG/3ML) 0.083% IN NEBU
2.5000 mg | INHALATION_SOLUTION | RESPIRATORY_TRACT | Status: DC | PRN
  Administered 2015-09-25 (×2): 2.5 mg via RESPIRATORY_TRACT
  Filled 2015-09-25 (×2): qty 3

## 2015-09-25 MED ORDER — METHYLPREDNISOLONE SODIUM SUCC 125 MG IJ SOLR
60.0000 mg | Freq: Four times a day (QID) | INTRAMUSCULAR | Status: DC
  Administered 2015-09-25 – 2015-09-28 (×13): 60 mg via INTRAVENOUS
  Filled 2015-09-25 (×13): qty 2

## 2015-09-25 NOTE — Progress Notes (Signed)
Pt's daughter, Neoma Laming [phone]), states pt has been getting progressively weaker and would like him to get evaluated for placement at [facility] [place].  She was also very concerned about his upcoming chemo appointment this Tuesday 6/13 which she emphasized that "he cannot miss".  She was wondering if it would be possible for him to get his treatment here?

## 2015-09-25 NOTE — Care Management Important Message (Signed)
Important Message  Patient Details  Name: [name] [name] MRN: [id number] Date of Birth: Sep 21, 1940   Medicare Important Message Given:  Yes    Sherald Barge, RN 09/25/2015, 12:31 PM

## 2015-09-25 NOTE — Progress Notes (Signed)
PROGRESS NOTE  [name] [name] [id number] DOB: 12/05/1940 DOA: 09/24/2015 PCP: Kenn File, MD  Brief Narrative: 75 yom with history of COPD, chronic respiratory failure on 2L O2, presented with complaints of weakness and reports recent fall. Patient also complained of increased dyspnea and wheezing. In the ED, was found to be hypercarbic. Started on BiPAP and referred for admission for COPD exacerbation.   Assessment/Plan: 1. Acute hypercapnic respiratory failure with acute respiratory acidosis superimposed on chronic hypoxic respiratory failure, secondary to COPD exacerbation. Chest x-ray with mild interstitial edema Started on IV abx, IV steroids, nebs, and BiPAP PRN. Improved with no evidence of volume overload. 2. Generalized weakness and falls at home. Likely secondary to acute issues. Physical therapy consult. 3. Hyperglycemia. Steroid induced. Hgb A1C in process. 4. Tobacco use disorder.   Improving. Follow up PT recommendations.   Continue Abx, steroids, and nebs.  Follow up Hgb A1C.   DVT prophylaxis: Lovenox  Code Status: Full Family Communication: No family bedside Disposition Plan:   [name], MD  [facility] Direct contact: [phone] --Via amion app OR  --[email or web address]; password [email or web address]  7PM-7AM contact night coverage as above 09/25/2015, 6:32 AM  LOS: 1 day   Consultants:  None  Procedures:  None  Antimicrobials:  None  HPI/Subjective: Breathing is worse then yesterday Mild productive cough with white sputum . Denies any nausea and vomiting. Denies any pain, reports being unable to ambulate on the right leg. Sensation intact  Objective: Filed Vitals:   09/25/15 0300 09/25/15 0356 09/25/15 0400 09/25/15 0500  BP: 122/76  119/76 123/68  Pulse: 81 83 80 79  Temp:   97 F (36.1 C)   TempSrc:   Axillary   Resp: 37 33 30 27  Height:      Weight:    70.9 kg (156 lb 4.9 oz)  SpO2: 94% 94% 94% 94%    Intake/Output Summary (Last  24 hours) at 09/25/15 [id number] Last data filed at 09/25/15 0200  Gross per 24 hour  Intake    480 ml  Output      0 ml  Net    480 ml     Filed Weights   09/24/15 1913 09/24/15 2312 09/25/15 0500  Weight: 72.576 kg (160 lb) 70.9 kg (156 lb 4.9 oz) 70.9 kg (156 lb 4.9 oz)    Exam: Constitutional:  . Appears calm and comfortable Eyes:  . Pupils: Left 75mm right 79mm. PERRL and irises appear normal  . Conjunctivae and lids appear normal ENMT:  . external ears, nose appear normal . grossly normal hearing  . Lips appear normal;  . Geographic tongue with white exudate and palatal erythema. Respiratory:  . Bilateral diffused wheezes. Fair air movement. No rales or rhonchi. Able to speak in full sentences.   . Mild increased respiratory effort normal. No retractions or accessory muscle use Cardiovascular:  . RRR, no m/r/g . No LE extremity edema   . Telemetry SR Musculoskeletal: . Digits/nails: no clubbing, cyanosis, petechiae, infection. Except onychomycosis bilateral toes.  . Normal RUE, LUE, RLE, LLE   o strength and tone normal, no atrophy, no abnormal movements Skin:  . No rashes, lesions, ulcers noted . palpation of skin: no induration or nodules Neurologic:  . Grossly normal Psychiatric:  . judgement and insight appear normal . Mental status o Mood, affect appropriate o Orientation to person, place, time   I have personally reviewed following labs and imaging studies:  CBC and CMP unremarkable.   Scheduled Meds: .  aspirin EC  81 mg Oral q morning - 10a  . cholecalciferol  1,000 Units Oral q morning - 10a  . DULoxetine  30 mg Oral BID  . enoxaparin (LOVENOX) injection  40 mg Subcutaneous Q24H  . Garlic  1 capsule Oral q morning - 10a  . loratadine  10 mg Oral Daily  . mometasone-formoterol  2 puff Inhalation BID  . multivitamin with minerals  1 tablet Oral Daily  . pantoprazole  40 mg Oral Daily  . roflumilast  500 mcg Oral Daily  . simvastatin  40 mg Oral q  morning - 10a  . sodium chloride flush  3 mL Intravenous Q12H  . tiotropium  1 capsule Inhalation Daily  . vitamin B-12  2,500 mcg Oral Daily   Continuous Infusions:   Active Problems:   COPD exacerbation ([place])   Respiratory failure ([place])   Acute on chronic respiratory failure with hypercapnia ([place])   LOS: 1 day   Time spent 20 minutes  By signing my name below, I, Rennis Harding attest that this documentation has been prepared under the direction and in the presence of [name], MD Electronically signed: Rennis Harding  09/25/2015 8:40am   I personally performed the services described in this documentation. All medical record entries made by the scribe were at my direction. I have reviewed the chart and agree that the record reflects my personal performance and is accurate and complete. [name], MD

## 2015-09-26 LAB — GLUCOSE, CAPILLARY: Glucose-Capillary: 181 mg/dL — ABNORMAL HIGH (ref 65–99)

## 2015-09-26 MED ORDER — IPRATROPIUM-ALBUTEROL 0.5-2.5 (3) MG/3ML IN SOLN
3.0000 mL | Freq: Four times a day (QID) | RESPIRATORY_TRACT | Status: DC
  Administered 2015-09-26 – 2015-09-28 (×8): 3 mL via RESPIRATORY_TRACT
  Filled 2015-09-26 (×8): qty 3

## 2015-09-26 MED ORDER — INSULIN ASPART 100 UNIT/ML ~~LOC~~ SOLN
0.0000 [IU] | Freq: Three times a day (TID) | SUBCUTANEOUS | Status: DC
  Administered 2015-09-27: 1 [IU] via SUBCUTANEOUS
  Administered 2015-09-27: 2 [IU] via SUBCUTANEOUS
  Administered 2015-09-27: 5 [IU] via SUBCUTANEOUS
  Administered 2015-09-28: 3 [IU] via SUBCUTANEOUS
  Administered 2015-09-28: 2 [IU] via SUBCUTANEOUS

## 2015-09-26 MED ORDER — ALPRAZOLAM 1 MG PO TABS
1.0000 mg | ORAL_TABLET | Freq: Every evening | ORAL | Status: DC | PRN
  Administered 2015-09-26 – 2015-09-27 (×2): 1 mg via ORAL
  Filled 2015-09-26 (×2): qty 1

## 2015-09-26 MED ORDER — INSULIN ASPART 100 UNIT/ML ~~LOC~~ SOLN
0.0000 [IU] | Freq: Every day | SUBCUTANEOUS | Status: DC
  Administered 2015-09-27: 2 [IU] via SUBCUTANEOUS

## 2015-09-26 NOTE — Progress Notes (Signed)
Patient has our CPAP machine at bedside. Patient is refusing to wear tonight, states that he hates wearing it. Patient on 2.5LNC. RT will continue to monitor.

## 2015-09-26 NOTE — Progress Notes (Signed)
PROGRESS NOTE  [name] [name] [id number] DOB: Sep 29, 1940 DOA: 09/24/2015 PCP: Kenn File, MD  Brief Narrative: 68 yom with history of COPD, chronic respiratory failure on 2L O2, presented with complaints of weakness and reports recent fall. Patient also complained of increased dyspnea and wheezing. In the ED, was found to be hypercarbic. Started on BiPAP and referred for admission for COPD exacerbation.   Assessment/Plan: 1. Acute hypercapnic respiratory failure with acute respiratory acidosis superimposed on chronic hypoxic respiratory failure.  2. COPD exacerbation. No significant change today.  3. Generalized weakness and falls at home. Likely secondary to acute issues. Physical therapy has recommended home health PT. 4. Hyperglycemia. Steroid induced.  5. Tobacco use disorder.    Wheezing has not improved. Continue steroids, and nebs. Start on breathing treatment x4 per day   DVT prophylaxis: Lovenox  Code Status: Full Family Communication: No family bedside  Disposition Plan: Discharge home health physical therapy once improved, likely 48 hours  [name], MD  [facility] Direct contact: [phone] --Via [place]  --[email or web address]; password [email or web address]  7PM-7AM contact night coverage as above 09/26/2015, 6:58 AM  LOS: 2 days   Consultants:  None  Procedures:  None  Antimicrobials:  None  HPI/Subjective: Breathing has worsened. Is having a productive cough. Did not sleep much last night.   Objective: Filed Vitals:   09/25/15 1750 09/25/15 2211 09/25/15 2243 09/26/15 0445  BP:   119/66 122/64  Pulse:  72 72 69  Temp:   97.4 F (36.3 C) 97.7 F (36.5 C)  TempSrc:   Oral Oral  Resp:   20 20  Height:      Weight:      SpO2: 87% 92% 92% 94%    Intake/Output Summary (Last 24 hours) at 09/26/15 0658 Last data filed at 09/25/15 1900  Gross per 24 hour  Intake    720 ml  Output      0 ml  Net    720 ml     Filed Weights   09/24/15  1913 09/24/15 2312 09/25/15 0500  Weight: 72.576 kg (160 lb) 70.9 kg (156 lb 4.9 oz) 70.9 kg (156 lb 4.9 oz)    Exam:  General:  Appears calm and comfortable Cardiovascular: RRR, no m/r/g. No LE edema. Respiratory: bilateral wheeze, prolonged expirations, no r/r. Mild increased respiratory effort. Abdomen: soft, ntnd Musculoskeletal: moves all extremities to command Psychiatric: judgement, insight, mood and affect are normal    I have personally reviewed following labs and imaging studies:  No new data  Scheduled Meds: . aspirin EC  81 mg Oral q morning - 10a  . cholecalciferol  1,000 Units Oral q morning - 10a  . DULoxetine  30 mg Oral BID  . enoxaparin (LOVENOX) injection  40 mg Subcutaneous Q24H  . loratadine  10 mg Oral Daily  . methylPREDNISolone (SOLU-MEDROL) injection  60 mg Intravenous Q6H  . multivitamin with minerals  1 tablet Oral Daily  . pantoprazole  40 mg Oral Daily  . roflumilast  500 mcg Oral Daily  . simvastatin  40 mg Oral q morning - 10a  . sodium chloride flush  3 mL Intravenous Q12H  . vitamin B-12  2,500 mcg Oral Daily   Continuous Infusions:   Principal Problem:   Acute on chronic respiratory failure with hypercapnia (HCC) Active Problems:   COPD exacerbation (H[facility])   Respiratory failure ([facility])   Pressure ulcer   LOS: 2 days   Time spent 20 minutes  By signing  my name below, I, Delene Ruffini, attest that this documentation has been prepared under the direction and in the presence of [place] Madding P. Sarajane Jews, MD. Electronically Signed: Delene Ruffini, Scribe.  09/26/2015 2:20pm  I personally performed the services described in this documentation. All medical record entries made by the scribe were at my direction. I have reviewed the chart and agree that the record reflects my personal performance and is accurate and complete. [name], MD

## 2015-09-26 NOTE — Evaluation (Signed)
Physical Therapy Evaluation Patient Details Name: [name] [name] MRN: [id number] DOB: 02-13-1941 Today's Date: 09/26/2015   History of Present Illness  [name] [name] is a 75yo white male who comes to [facility] with worsening SOB, found to be hypercapnic upon arrival. Pt was admitted in Jan 2016 for a similar issue and seen by PT then. At baseline, pt reports using 4LO2 ( ~38yr), and performing  househodl distances only. He reports at least 6 falls at home in the last 3 months.   Clinical Impression  Upon entry, the patient is received semirecumbent in bed, no family/caregiver present. The pt is awake and agreeable to participate. No acute distress noted at this time. The pt is alert and oriented x3, pleasant, conversational, and following simple commands consistently. Pt received on and remaining on 2.5L O2, although he reports that he has been on 4L/min for about a year at home; first gait trial with noted desaturation to  74% and poor recovery over 90 seconds. Second gait trial performed on 6L/min, with SaO2 between 94-97%, however gait speed still very slow. Functional mobility assessment demonstrates moderate weakness, with multiple LOB during tranfers. The pt now requiring min-assist physical assistance for transfers, and falls recovery during gait, whereas the patient performs these with modified independence at baseline. The patient is at high risk for falls as evidence by gait speed <.0.22m/s, forward reach <5", and multiple LOB demonstrated throughout session. Pt also reports 6 or more falls in the last 3 months.   Patient presenting with impairment of strength, balance, and activity tolerance, limiting ability to perform ADL and mobility tasks at  baseline level of function. Patient will benefit from skilled intervention to address the above impairments and limitations, in order to restore to prior level of function, improve patient safety upon discharge, and to decrease falls risk.       Follow Up  Recommendations Home health PT;Supervision for mobility/OOB;Supervision - Intermittent    Equipment Recommendations  None recommended by PT    Recommendations for Other Services       Precautions / Restrictions Precautions Precautions: None Restrictions Weight Bearing Restrictions: No      Mobility  Bed Mobility Overal bed mobility: Modified Independent                Transfers Overall transfer level: Needs assistance Equipment used: Rolling walker (2 wheeled) Transfers: Sit to/from Stand Sit to Stand: Min guard         General transfer comment: LOB with transfers x3, with poor control, coming close to falling to floor.   Ambulation/Gait Ambulation/Gait assistance: Min guard Ambulation Distance (Feet): 35 Feet Assistive device: Rolling walker (2 wheeled)     Gait velocity interpretation: <1.8 ft/sec, indicative of risk for recurrent falls General Gait Details: veyr slow and unstable, poor awareness ofO2 line, LOB while trying to balance to void; O2>93% on 6L; first trial at 2.5L and SaO2: 74% p 45 seconds activity.   Stairs            Wheelchair Mobility    Modified Rankin (Stroke Patients Only)       Balance Overall balance assessment: History of Falls;Needs assistance                             High Level Balance Comments: several major LOB during session with limited recovery.              Pertinent Vitals/Pain Pain Assessment: No/denies pain  Home Living Family/patient expects to be discharged to:: Private residence Living Arrangements: Non-relatives/Friends (a roommate he calls 'a drunk') Available Help at Discharge: Family;Available PRN/intermittently (daughter assists with groceries; IADL. ) Type of Home: Mobile home Home Access: Ramped entrance     Home Layout: One level Home Equipment: Walker - 2 wheels;Cane - single point;Shower seat;Bedside commode Additional Comments: no longer has his pulse oximeter as he  accidently ran it throught the washing machine.     Prior Function Level of Independence: Needs assistance         Comments: dependent for IADL, household distances on 4L/min with AD.      Hand Dominance        Extremity/Trunk Assessment                         Communication   Communication: HOH  Cognition Arousal/Alertness: Awake/alert Behavior During Therapy: WFL for tasks assessed/performed Overall Cognitive Status: Within Functional Limits for tasks assessed                      General Comments      Exercises        Assessment/Plan    PT Assessment Patient needs continued PT services  PT Diagnosis Difficulty walking;Abnormality of gait   PT Problem List Decreased strength;Decreased activity tolerance;Decreased balance;Decreased mobility;Cardiopulmonary status limiting activity;Decreased safety awareness;Decreased knowledge of precautions  PT Treatment Interventions Gait training;Functional mobility training;Therapeutic activities;Therapeutic exercise;Patient/family education   PT Goals (Current goals can be found in the Care Plan section) Acute Rehab PT Goals Patient Stated Goal: improve ability to breath, and stop falling so much.  PT Goal Formulation: With patient Time For Goal Achievement: 10/10/15 Potential to Achieve Goals: Good    Frequency Min 3X/week   Barriers to discharge Decreased caregiver support      Co-evaluation               End of Session Equipment Utilized During Treatment: Oxygen;Gait belt Activity Tolerance: Patient tolerated treatment well;Patient limited by fatigue Patient left: in bed;with call bell/phone within reach Nurse Communication: Other (comment)         Time: [id number] PT Time Calculation (min) (ACUTE ONLY): 19 min   Charges:   PT Evaluation $PT Eval Moderate Complexity: 1 Procedure PT Treatments $Therapeutic Activity: 8-22 mins   PT G Codes:        1:48 PM, 10/08/15 Etta Grandchild, PT, DPT PRN Physical Therapist - [place] License # [id number] ([phone] (mobile)

## 2015-09-27 DIAGNOSIS — R531 Weakness: Secondary | ICD-10-CM

## 2015-09-27 DIAGNOSIS — J441 Chronic obstructive pulmonary disease with (acute) exacerbation: Principal | ICD-10-CM

## 2015-09-27 DIAGNOSIS — J9622 Acute and chronic respiratory failure with hypercapnia: Secondary | ICD-10-CM

## 2015-09-27 LAB — GLUCOSE, CAPILLARY
GLUCOSE-CAPILLARY: 279 mg/dL — AB (ref 65–99)
GLUCOSE-CAPILLARY: 128 mg/dL — AB (ref 65–99)
Glucose-Capillary: 185 mg/dL — ABNORMAL HIGH (ref 65–99)
Glucose-Capillary: 235 mg/dL — ABNORMAL HIGH (ref 65–99)

## 2015-09-27 MED ORDER — GUAIFENESIN ER 600 MG PO TB12
1200.0000 mg | ORAL_TABLET | Freq: Two times a day (BID) | ORAL | Status: DC
  Administered 2015-09-27 – 2015-09-28 (×3): 1200 mg via ORAL
  Filled 2015-09-27 (×3): qty 2

## 2015-09-27 NOTE — Care Management Note (Signed)
Case Management Note  Patient Details  Name: [name] [name] MRN: [id number] Date of Birth: 02-09-1941  Subjective/Objective:                  Pt is from home, lives alone and has daughter and sister who provide emotional support but are unable to provide physical assistance or stay with pt. Pt has home O2 and CPAP through [employer] PTA. Pt has cane, walker and WC but states need for walker with a seat. Pt has not had walker ordered in past 5 years and rollator was order. Pt has chosen [facility] from list of DME providers. Romualdo Bolk, of [facility], made aware of DME order and will deliver rollator to pt's room prior to DC. Pt is active with [facility] for [facility] services PTA. Pt and daughter voiced hope that pt could go to [facility] for [place] at [place]. PT has made [facility] PT recommendation. Pt needs supervision but not SNF. CM discussed DC options with daughter, [name] who states they are unable to pay privately. [name] states she is applying for medicaid for her dad and then hopes to get him a sitter 4 days a week, CM explained to [name] we will ask [facility] to increase PT visits at DC. Kennyth Arnold, of [facility] is aware of admission.   Action/Plan: Will cont to follow for DC planning.   Expected Discharge Date:  09/28/15               Expected Discharge Plan:  [place]  In-House Referral:  NA  Discharge planning Services  CM Consult  Post Acute Care Choice:  Home Health, Resumption of Svcs/PTA Provider, Durable Medical Equipment Choice offered to:  Patient  DME Arranged:  Walker rolling with seat DME Agency:  [place] Arranged:  RN, PT, Nurse's Aide [address] Agency:  [place]  Status of Service:  In process, will continue to follow  Medicare Important Message Given:  Yes Date Medicare IM Given:    Medicare IM give by:    Date Additional Medicare IM Given:    Additional Medicare Important Message give by:     If discussed at [place] of Stay Meetings, dates  discussed:    Additional Comments:  Sherald Barge, RN 09/27/2015, 1:19 PM

## 2015-09-27 NOTE — Progress Notes (Signed)
PROGRESS NOTE    [name] [name]  [id number] DOB: [birth year] DOA: 09/24/2015 PCP: Kenn File, MD  Outpatient Specialists:    Brief Narrative:  37 yom presented with complaints of weakness and recent fall. While in the ED, he was found to be hypercarbic and was started on BiPAP. He has been improving with steroids, and nebs; however, he continued to have significant wheezing, so he was started on breathing treatments qid. PT has evaluated the patient and recommends [place] PT. Anticipate discharge home tomorrow.    Assessment & Plan:   Principal Problem:   Acute on chronic respiratory failure with hypercapnia (HCC) Active Problems:   COPD exacerbation (HCC)   Respiratory failure (HCC)   Pressure ulcer  1. Acute hypercapnic respiratory failure with acute respiratory acidosis superimposed on chronic hypoxic respiratory failure. Patient has been weaned down to 2.5L [place]. He reports that he wears 4L home O2. 2. COPD exacerbation. Still continues to wheeze, although likely improving. Continue steroids, nebs, and pulmonary hygiene. Add mucinex and flutter valve. 3. Generalized weakness and falls at home. Likely secondary to acute issues. Physical therapy has recommended home health PT. 4. Hyperglycemia. Steroid induced.  5. Tobacco use disorder. Counseled on the importance of cessation.    DVT prophylaxis: Lovenox Code Status: Full Family Communication: discussed with patient. No family at bedside. Disposition Plan: Discharge with [facility] PT once improved, hopefully tomorrow.    Consultants:  None  Procedures:  BiPAP  Antimicrobials:  None   Subjective: Feels all right. Is having a productive cough. Wheeze feels roughly the same as yesterday.  Objective: Filed Vitals:   09/26/15 1448 09/26/15 2010 09/26/15 2200 09/27/15 0207  BP:   129/77   Pulse:   73   Temp:   97.6 F (36.4 C)   TempSrc:   Oral   Resp:   18   Height:      Weight:      SpO2: 93% 93% 95% 90%     Intake/Output Summary (Last 24 hours) at 09/27/15 0729 Last data filed at 09/26/15 2126  Gross per 24 hour  Intake    720 ml  Output    200 ml  Net    520 ml   Filed Weights   09/24/15 1913 09/24/15 2312 09/25/15 0500  Weight: 72.576 kg (160 lb) 70.9 kg (156 lb 4.9 oz) 70.9 kg (156 lb 4.9 oz)    Examination:  General exam: Appears calm and comfortable  Respiratory system: Respiratory effort normal. mild bilateral wheeze Cardiovascular system: S1 & S2 heard, RRR. No JVD, murmurs, rubs, gallops or clicks. No pedal edema. Gastrointestinal system: Abdomen is nondistended, soft and nontender. No organomegaly or masses felt. Normal bowel sounds heard. Central nervous system: Alert and oriented. No focal neurological deficits. Extremities: Symmetric 5 x 5 power. Skin: No rashes, lesions or ulcers Psychiatry: Judgement and insight appear normal. Mood & affect appropriate.   Data Reviewed: I have personally reviewed following labs and imaging studies  CBC:  Recent Labs Lab 09/24/15 1953 09/25/15 0433  WBC 12.2* 11.3*  HGB 11.5* 11.3*  HCT 35.4* 35.0*  MCV 94.7 94.1  PLT 353 [id number]   Basic Metabolic Panel:  Recent Labs Lab 09/24/15 1953 09/25/15 0433  NA 138 134*  K 4.0 4.3  CL 96* 96*  CO2 37* 32  GLUCOSE 108* 168*  BUN 7 7  CREATININE 0.74 0.66  CALCIUM 8.7* 8.8*   GFR: Estimated Creatinine Clearance: 71.5 mL/min (by C-G formula based on Cr of 0.66).  Liver Function Tests:  Recent Labs Lab 09/24/15 1953 09/25/15 0433  AST 15 19  ALT 12* 16*  ALKPHOS 71 74  BILITOT 0.3 0.4  PROT 6.5 6.8  ALBUMIN 3.3* 3.4*   CBG:  Recent Labs Lab 09/24/15 1953 09/26/15 2153 09/27/15 0714  GLUCAP 125* 181* 185*   Urine analysis:    Component Value Date/Time   COLORURINE YELLOW 09/24/2015 1951   APPEARANCEUR CLEAR 09/24/2015 1951   LABSPEC 1.010 09/24/2015 1951   PHURINE 7.0 09/24/2015 1951   GLUCOSEU NEGATIVE 09/24/2015 1951   HGBUR TRACE* 09/24/2015 1951    BILIRUBINUR NEGATIVE 09/24/2015 1951   KETONESUR NEGATIVE 09/24/2015 1951   PROTEINUR NEGATIVE 09/24/2015 1951   UROBILINOGEN 0.2 05/04/2014 0956   NITRITE NEGATIVE 09/24/2015 1951   LEUKOCYTESUR NEGATIVE 09/24/2015 1951   Sepsis Labs: @LABRCNTIP (procalcitonin:4,lacticidven:4)  ) Recent Results (from the past 240 hour(s))  MRSA PCR Screening     Status: None   Collection Time: 09/24/15 10:50 PM  Result Value Ref Range Status   MRSA by PCR NEGATIVE NEGATIVE Final    Comment:        The GeneXpert MRSA Assay (FDA approved for NASAL specimens only), is one component of a comprehensive MRSA colonization surveillance program. It is not intended to diagnose MRSA infection nor to guide or monitor treatment for MRSA infections.      Scheduled Meds: . aspirin EC  81 mg Oral q morning - 10a  . cholecalciferol  1,000 Units Oral q morning - 10a  . DULoxetine  30 mg Oral BID  . enoxaparin (LOVENOX) injection  40 mg Subcutaneous Q24H  . insulin aspart  0-5 Units Subcutaneous QHS  . insulin aspart  0-9 Units Subcutaneous TID WC  . ipratropium-albuterol  3 mL Nebulization Q6H  . loratadine  10 mg Oral Daily  . methylPREDNISolone (SOLU-MEDROL) injection  60 mg Intravenous Q6H  . multivitamin with minerals  1 tablet Oral Daily  . pantoprazole  40 mg Oral Daily  . roflumilast  500 mcg Oral Daily  . simvastatin  40 mg Oral q morning - 10a  . sodium chloride flush  3 mL Intravenous Q12H  . vitamin B-12  2,500 mcg Oral Daily   Continuous Infusions:    LOS: 3 days    Time spent: 25 minutes   Kathie Dike, MD [facility] Pager [phone]  If 7PM-7AM, please contact night-coverage [email or web address] Password [email or web address] 09/27/2015, 7:29 AM   By signing my name below, I, Delene Ruffini, attest that this documentation has been prepared under the direction and in the presence of Kathie Dike, MD. Electronically Signed: Delene Ruffini 09/27/2015 12:35pm  I, Dr. Kathie Dike, personally performed the services described in this documentaiton. All medical record entries made by the scribe were at my direction and in my presence. I have reviewed the chart and agree that the record reflects my personal performance and is accurate and complete  Kathie Dike, MD, 09/27/2015 12:54 PM

## 2015-09-27 NOTE — Care Management Important Message (Signed)
Important Message  Patient Details  Name: BRENNDEN THAKORE MRN: [id number] Date of Birth: [birth year]   Medicare Important Message Given:  Yes    Sherald Barge, RN 09/27/2015, 3:56 PM

## 2015-09-27 NOTE — Clinical Social Work Note (Signed)
CSW spoke with patient's daughter, [name], who advised that family wanted patient in SNF. CSW discussed that PT had recommended HHPT.  CSW advised that if patient went in to SNF it would be private pay due to PT recommendation of [place].  CSW discussed ALF as a private pay option as well.       [name] [name], Clydene Pugh, LCSW

## 2015-09-27 NOTE — Progress Notes (Signed)
Physical Therapy Treatment Patient Details Name: [name] SCHULTEIS MRN: [id number] DOB: [birth year] Today's Date: 09/27/2015    History of Present Illness [name] [name] is a 75yo white male who comes to [facility] with worsening SOB, found to be hypercapnic upon arrival. Pt was admitted in Jan 2016 for a similar issue and seen by PT then. At baseline, pt reports using 4LO2 ( ~45yr), and performing  househodl distances only. He reports at least 6 falls at home in the last 3 months.     PT Comments    Pt received sitting up in the chair, but was agreeable to PT tx.  Pt expressed that he has been waiting to get up and move.  Pt required up to [place] for sit<>stand, and ambulated 144ft with RW, however pt's SpO2 desaturated to 86% while on 3L, therefore increased to 4L, and encouraged PLB.  Pt demonstrated 2 LOB during ambulation - when turning his head.  He is recommended for HHPT and supervision/assistance upon d/c home.   Follow Up Recommendations  Home health PT;Supervision/Assistance - 24 hour     Equipment Recommendations  None recommended by PT    Recommendations for Other Services       Precautions / Restrictions Precautions Precautions: Fall Precaution Comments: Pt reports he has had 6 falls in the past 3 months.  Restrictions Weight Bearing Restrictions: No    Mobility  Bed Mobility Overal bed mobility: Modified Independent                Transfers Overall transfer level: Needs assistance Equipment used: Rolling walker (2 wheeled) Transfers: Sit to/from Stand Sit to Stand: Min guard;Min assist         General transfer comment: vc's for hand placement, and Min A required due to initial posterior LOB with LE's resting against the chair.    Ambulation/Gait Ambulation/Gait assistance: Min guard Ambulation Distance (Feet): 100 Feet Assistive device: Rolling walker (2 wheeled)       General Gait Details: Pt with uneven stride length, and 2 LOB during ambulation - noted  when pt turning head to look another direction.  SpO2 desaturation to 86% on 3L, but quickly returned to 90% with vc's for PLB and increase to 4L.  Pt states that he is normally on 4L at home.    Stairs            Wheelchair Mobility    Modified Rankin (Stroke Patients Only)       Balance Overall balance assessment: Needs assistance;History of Falls         Standing balance support: Bilateral upper extremity supported Standing balance-Leahy Scale: Fair                      Cognition Arousal/Alertness: Awake/alert Behavior During Therapy: WFL for tasks assessed/performed Overall Cognitive Status: Within Functional Limits for tasks assessed                      Exercises      General Comments        Pertinent Vitals/Pain Pain Assessment: No/denies pain    Home Living                      Prior Function            PT Goals (current goals can now be found in the care plan section) Acute Rehab PT Goals Patient Stated Goal: improve ability to breath, and stop falling  so much.  PT Goal Formulation: With patient Time For Goal Achievement: 10/10/15 Potential to Achieve Goals: Fair Progress towards PT goals: Progressing toward goals    Frequency  Min 3X/week    PT Plan Current plan remains appropriate    Co-evaluation             End of Session Equipment Utilized During Treatment: Oxygen;Gait belt Activity Tolerance: Patient limited by fatigue Patient left: in bed;with call bell/phone within reach;with bed alarm set     Time: 1430-1451 PT Time Calculation (min) (ACUTE ONLY): 21 min  Charges:  $Gait Training: 8-22 mins                    G Codes:  Functional Assessment Tool Used: [employer] "6-clicks"  Functional Limitation: Mobility: Walking and moving around Mobility: Walking and Moving Around Current Status [phone]): At least 40 percent but less than 60 percent impaired, limited or restricted Mobility:  Walking and Moving Around Goal Status [phone]): At least 20 percent but less than 40 percent impaired, limited or restricted   Beth Kloe Oates, PT, DPT X: [phone]

## 2015-09-28 LAB — GLUCOSE, CAPILLARY
Glucose-Capillary: 155 mg/dL — ABNORMAL HIGH (ref 65–99)
Glucose-Capillary: 236 mg/dL — ABNORMAL HIGH (ref 65–99)
Glucose-Capillary: 217 mg/dL — ABNORMAL HIGH (ref 65–99)

## 2015-09-28 MED ORDER — GUAIFENESIN ER 600 MG PO TB12: 600.0000 mg | ORAL_TABLET | Freq: Two times a day (BID) | ORAL | Status: DC

## 2015-09-28 MED ORDER — PREDNISONE 10 MG PO TABS: ORAL_TABLET | ORAL | Status: DC

## 2015-09-28 NOTE — Consult Note (Signed)
   [facility] Inpatient Consult   09/28/2015  [name] [name] [birth year] [id number]  Spoke with patient at bedside regarding [facility] program, he requested [facility] call daughter, [name] [name] at [phone].  Call to daughter, [name], discussed [facility] program. Daughter states she is working on getting help in home, she states they have applied for Medicaid. She states she was hoping patient would qualify for skilled, but he will be getting home care services.  She would appreciate a brochure about our program for future reference. Brochure left with patient. Of note, [facility] program services would not interfere or replace services arranged by inpatient case management or social work.  For questions or concerns contact: [name]. [name], RN, BSN, [facility] Liaison [phone]

## 2015-09-28 NOTE — Discharge Summary (Signed)
Physician Discharge Summary  [name] [name] [id number] DOB: [birth year] DOA: 09/24/2015  PCP: Kenn File, MD  Admit date: 09/24/2015 Discharge date: 09/28/2015  Time spent: 35 minutes  Recommendations for Outpatient Follow-up:  1. Follow-up with PCP in 1-2 weeks. 2. Discharge home with homehealth PT, RN, aide and social work.   Discharge Diagnoses:  Principal Problem:   Acute on chronic respiratory failure with hypercapnia (HCC) Active Problems:   COPD exacerbation (HCC)   Respiratory failure (HCC)   Pressure ulcer   Discharge Condition: Improved   Diet recommendation: Heart healthy   Filed Weights   09/24/15 2312 09/25/15 0500 09/28/15 0600  Weight: 70.9 kg (156 lb 4.9 oz) 70.9 kg (156 lb 4.9 oz) 73.4 kg (161 lb 13.1 oz)    History of present illness:  76 yom presented with complaints of weakness and recent fall. While in the ED, he was found to be hypercarbic and was started on BiPAP. He was referred for admission.   Hospital Course:  Patient was found to be in acute on chronic hypercapnic respiratory failure with acute respiratory acidosis superimposed on chronic hypoxic respiratory failure on admission. Respiratory failure was related to COPD exacerbation. He was started on BiPAP and was able to transition and weaned down to 3-4L [place]. He reports that he wears 4L home O2. He was noted to improved with IV steroids and nebs and appears to be approaching his baseline. He still has some mild wheezing bilaterally. I suspect this is close to his baseline and he is able to carry on a conversation without difficulty. Will continue neb treatment on discharge and transition to steroid taper.    COPD exacerbation. Continues to improve. Continue steroid taper, nebs, and pulmonary hygiene.   Generalized weakness and falls at home. Likely secondary to acute issues. Physical therapy has recommended home health PT.  Hyperglycemia. Steroid induced, anticipate improvement as steroids  are tapered.   Tobacco use disorder. Counseled on the importance of cessation.  Procedures:  None                                                                                  Consultations:  None  Discharge Exam: Filed Vitals:   09/28/15 0644 09/28/15 0857  BP: 143/81 139/84  Pulse: 65 126  Temp: 98.1 F (36.7 C) 97.9 F (36.6 C)  Resp: 18 22    General: NAD Cardiovascular: RRR, S1, S2  Respiratory:  Mild wheezing. No rales or rhonchi Abdomen: soft, non tender, no distention , bowel sounds normal Musculoskeletal: No edema b/l   Discharge Instructions   Discharge Instructions    Diet - low sodium heart healthy    Complete by:  As directed      Increase activity slowly    Complete by:  As directed           Current Discharge Medication List    START taking these medications   Details  guaiFENesin (MUCINEX) 600 MG 12 hr tablet Take 1 tablet (600 mg total) by mouth 2 (two) times daily. Qty: 30 tablet, Refills: 0      CONTINUE these medications which have CHANGED   Details  predniSONE (DELTASONE) 10 MG  tablet Take 40mg  po daily for 2 days then 30mg  daily for 2 days then 20mg  daily for 2 days the 10mg  daily for 2 days then stop Qty: 20 tablet, Refills: 0      CONTINUE these medications which have NOT CHANGED   Details  albuterol (PROVENTIL) (2.5 MG/3ML) 0.083% nebulizer solution Take 2.5 mg by nebulization every 4 (four) hours as needed. For shortness of breath    ALPRAZolam (XANAX) 1 MG tablet Take 0.5-1 mg by mouth 2 (two) times daily as needed for anxiety. anxiety    aspirin EC 81 MG tablet Take 81 mg by mouth every morning.     budesonide-formoterol (SYMBICORT) 160-4.5 MCG/ACT inhaler Inhale 2 puffs into the lungs 2 (two) times daily. Qty: 1 Inhaler, Refills: 12    Cholecalciferol (VITAMIN D3) 1000 UNITS CAPS Take 1 capsule by mouth every morning.     citalopram (CELEXA) 20 MG tablet Take 1 tablet by mouth daily.    Cyanocobalamin (B-12) 2500  MCG TABS Take 1 tablet by mouth daily.    DALIRESP 500 MCG TABS tablet Take 500 mcg by mouth daily.    docusate sodium (COLACE) 100 MG capsule Take 100 mg by mouth daily as needed for mild constipation or moderate constipation.     DULoxetine (CYMBALTA) 60 MG capsule Take 1 capsule (60 mg total) by mouth daily. Qty: 30 capsule, Refills: 0    esomeprazole (NEXIUM) 40 MG capsule Take 40 mg by mouth every morning.    Garlic 123XX123 MG CAPS Take 1 capsule by mouth every morning.     loratadine (CLARITIN) 10 MG tablet Take 10 mg by mouth daily.    Multiple Vitamin (MULTIVITAMIN WITH MINERALS) TABS tablet Take 1 tablet by mouth daily.    NITROSTAT 0.4 MG SL tablet Place 1 tablet under the tongue every 5 (five) minutes as needed for chest pain.     simvastatin (ZOCOR) 40 MG tablet Take 40 mg by mouth every morning.     SPIRIVA HANDIHALER 18 MCG inhalation capsule Place 1 capsule into inhaler and inhale daily.       Allergies  Allergen Reactions  . Iohexol Hives     Code: HIVES, Desc: PT STATES HE BROKE OUT IN HIVES AND RASH AFTER CT NECK EARLY SEPT 2011; NO RESP PROBLEMS; NEEDS PRE-MEDS; MKS, Onset Date: [id number]   . Ivp Dye [Iodinated Diagnostic Agents] Hives      The results of significant diagnostics from this hospitalization (including imaging, microbiology, ancillary and laboratory) are listed below for reference.    Significant Diagnostic Studies: Dg Chest 1 View  09/24/2015  CLINICAL DATA:  Acute onset of generalized weakness and falls. Initial encounter. EXAM: CHEST 1 VIEW COMPARISON:  Chest radiograph performed 08/18/2015 FINDINGS: The lungs are well-aerated. Vascular congestion is noted. Mildly increased interstitial markings may reflect mild interstitial edema. There is no evidence of pleural effusion or pneumothorax. The cardiomediastinal silhouette is within normal limits. No acute osseous abnormalities are seen. IMPRESSION: Vascular congestion noted. Mildly increased  interstitial markings may reflect mild interstitial edema. Electronically Signed   By: Garald Balding M.D.   On: 09/24/2015 20:27   Ct Head Wo Contrast  09/24/2015  CLINICAL DATA:  Acute onset of generalized weakness and falls. Initial encounter. EXAM: CT HEAD WITHOUT CONTRAST TECHNIQUE: Contiguous axial images were obtained from the base of the skull through the vertex without intravenous contrast. COMPARISON:  Images of the head from the PET/CT performed 01/09/2010 FINDINGS: There is no evidence of acute infarction, mass  lesion, or intra- or extra-axial hemorrhage on CT. Prominence of the ventricles and sulci reflects mild cortical volume loss. Mild cerebellar atrophy is noted. Mild periventricular white matter change likely reflects small vessel ischemic microangiopathy. The brainstem and fourth ventricle are within normal limits. The basal ganglia are unremarkable in appearance. The cerebral hemispheres demonstrate grossly normal gray-white differentiation. No mass effect or midline shift is seen. There is no evidence of fracture; visualized osseous structures are unremarkable in appearance. The orbits are within normal limits. The paranasal sinuses and mastoid air cells are well-aerated. No significant soft tissue abnormalities are seen. IMPRESSION: 1. No acute intracranial pathology seen on CT. 2. Mild cortical volume loss and scattered small vessel ischemic microangiopathy. Electronically Signed   By: Garald Balding M.D.   On: 09/24/2015 21:02    Microbiology: Recent Results (from the past 240 hour(s))  MRSA PCR Screening     Status: None   Collection Time: 09/24/15 10:50 PM  Result Value Ref Range Status   MRSA by PCR NEGATIVE NEGATIVE Final    Comment:        The GeneXpert MRSA Assay (FDA approved for NASAL specimens only), is one component of a comprehensive MRSA colonization surveillance program. It is not intended to diagnose MRSA infection nor to guide or monitor treatment for MRSA  infections.      Labs: Basic Metabolic Panel:  Recent Labs Lab 09/24/15 1953 09/25/15 0433  NA 138 134*  K 4.0 4.3  CL 96* 96*  CO2 37* 32  GLUCOSE 108* 168*  BUN 7 7  CREATININE 0.74 0.66  CALCIUM 8.7* 8.8*   Liver Function Tests:  Recent Labs Lab 09/24/15 1953 09/25/15 0433  AST 15 19  ALT 12* 16*  ALKPHOS 71 74  BILITOT 0.3 0.4  PROT 6.5 6.8  ALBUMIN 3.3* 3.4*  CBC:  Recent Labs Lab 09/24/15 1953 09/25/15 0433  WBC 12.2* 11.3*  HGB 11.5* 11.3*  HCT 35.4* 35.0*  MCV 94.7 94.1  PLT 353 357    CBG:  Recent Labs Lab 09/27/15 1110 09/27/15 1601 09/27/15 2048 09/28/15 0826 09/28/15 1134  GLUCAP 279* 128* 235* 155* 236*       Signed: Kathie Dike, MD  [facility] 09/28/2015, 1:17 PM     By signing my name below, I, Rennis Harding, attest that this documentation has been prepared under the direction and in the presence of Kathie Dike, MD. Electronically signed: Rennis Harding, Scribe. 09/28/2015 12:42pm   I, Dr. Kathie Dike, personally performed the services described in this documentaiton. All medical record entries made by the scribe were at my direction and in my presence. I have reviewed the chart and agree that the record reflects my personal performance and is accurate and complete  Kathie Dike, MD, 09/28/2015 1:17 PM

## 2015-09-28 NOTE — Care Management Note (Signed)
Case Management Note  Patient Details  Name: [name] [name] MRN: [id number] Date of Birth: [birth year]   Expected Discharge Date:  09/28/15               Expected Discharge Plan:  [place]  In-House Referral:  NA  Discharge planning Services  CM Consult  Post Acute Care Choice:  Home Health, Resumption of Svcs/PTA Provider, Durable Medical Equipment Choice offered to:  Patient  DME Arranged:  Walker rolling with seat DME Agency:  [place]:  RN, PT, Nurse's Aide, Social Work [employer] Agency:  [place]  Status of Service:  Completed, signed off  Medicare Important Message Given:  Yes Date Medicare IM Given:    Medicare IM give by:    Date Additional Medicare IM Given:    Additional Medicare Important Message give by:     If discussed at [place] of Stay Meetings, dates discussed:    Additional Comments: Pt discharging home today with resumption of [place] services through [facility] [place]. Pt aware HH has 48 hours to resume services. Kennyth Arnold, of [place], made aware of DC today. Pt's daughter will provide transport home and will bring port O2 tank from home. Rollator has been delivered to pt's room.   Sherald Barge, RN 09/28/2015, 1:59 PM

## 2015-09-29 ENCOUNTER — Telehealth: Payer: Self-pay | Admitting: *Deleted

## 2015-09-29 DIAGNOSIS — J9611 Chronic respiratory failure with hypoxia: Secondary | ICD-10-CM | POA: Diagnosis not present

## 2015-09-29 DIAGNOSIS — J449 Chronic obstructive pulmonary disease, unspecified: Secondary | ICD-10-CM | POA: Diagnosis not present

## 2015-09-29 DIAGNOSIS — Z9981 Dependence on supplemental oxygen: Secondary | ICD-10-CM | POA: Diagnosis not present

## 2015-09-29 DIAGNOSIS — Z7982 Long term (current) use of aspirin: Secondary | ICD-10-CM | POA: Diagnosis not present

## 2015-09-29 DIAGNOSIS — C679 Malignant neoplasm of bladder, unspecified: Secondary | ICD-10-CM | POA: Diagnosis not present

## 2015-09-29 NOTE — Telephone Encounter (Signed)
Unable to complete Transitional Care phone call because there isn't any valid telephone number.

## 2015-09-30 DIAGNOSIS — Z9981 Dependence on supplemental oxygen: Secondary | ICD-10-CM | POA: Diagnosis not present

## 2015-09-30 DIAGNOSIS — C679 Malignant neoplasm of bladder, unspecified: Secondary | ICD-10-CM | POA: Diagnosis not present

## 2015-09-30 DIAGNOSIS — J9611 Chronic respiratory failure with hypoxia: Secondary | ICD-10-CM | POA: Diagnosis not present

## 2015-09-30 DIAGNOSIS — Z7982 Long term (current) use of aspirin: Secondary | ICD-10-CM | POA: Diagnosis not present

## 2015-09-30 DIAGNOSIS — J449 Chronic obstructive pulmonary disease, unspecified: Secondary | ICD-10-CM | POA: Diagnosis not present

## 2015-10-03 ENCOUNTER — Ambulatory Visit: Payer: Commercial Managed Care - HMO | Admitting: Family Medicine

## 2015-10-03 DIAGNOSIS — J449 Chronic obstructive pulmonary disease, unspecified: Secondary | ICD-10-CM | POA: Diagnosis not present

## 2015-10-03 DIAGNOSIS — R269 Unspecified abnormalities of gait and mobility: Secondary | ICD-10-CM | POA: Diagnosis not present

## 2015-10-03 DIAGNOSIS — J441 Chronic obstructive pulmonary disease with (acute) exacerbation: Secondary | ICD-10-CM | POA: Diagnosis not present

## 2015-10-03 DIAGNOSIS — R0602 Shortness of breath: Secondary | ICD-10-CM | POA: Diagnosis not present

## 2015-10-03 DIAGNOSIS — J9601 Acute respiratory failure with hypoxia: Secondary | ICD-10-CM | POA: Diagnosis not present

## 2015-10-03 DIAGNOSIS — J188 Other pneumonia, unspecified organism: Secondary | ICD-10-CM | POA: Diagnosis not present

## 2015-10-04 DIAGNOSIS — J9611 Chronic respiratory failure with hypoxia: Secondary | ICD-10-CM | POA: Diagnosis not present

## 2015-10-04 DIAGNOSIS — Z9981 Dependence on supplemental oxygen: Secondary | ICD-10-CM | POA: Diagnosis not present

## 2015-10-04 DIAGNOSIS — C679 Malignant neoplasm of bladder, unspecified: Secondary | ICD-10-CM | POA: Diagnosis not present

## 2015-10-04 DIAGNOSIS — Z7982 Long term (current) use of aspirin: Secondary | ICD-10-CM | POA: Diagnosis not present

## 2015-10-04 DIAGNOSIS — J449 Chronic obstructive pulmonary disease, unspecified: Secondary | ICD-10-CM | POA: Diagnosis not present

## 2015-10-06 ENCOUNTER — Ambulatory Visit: Payer: Commercial Managed Care - HMO | Admitting: Family Medicine

## 2015-10-10 DIAGNOSIS — J9611 Chronic respiratory failure with hypoxia: Secondary | ICD-10-CM | POA: Diagnosis not present

## 2015-10-10 DIAGNOSIS — Z9981 Dependence on supplemental oxygen: Secondary | ICD-10-CM | POA: Diagnosis not present

## 2015-10-10 DIAGNOSIS — Z7982 Long term (current) use of aspirin: Secondary | ICD-10-CM | POA: Diagnosis not present

## 2015-10-10 DIAGNOSIS — C679 Malignant neoplasm of bladder, unspecified: Secondary | ICD-10-CM | POA: Diagnosis not present

## 2015-10-10 DIAGNOSIS — J449 Chronic obstructive pulmonary disease, unspecified: Secondary | ICD-10-CM | POA: Diagnosis not present

## 2015-10-11 DIAGNOSIS — C679 Malignant neoplasm of bladder, unspecified: Secondary | ICD-10-CM | POA: Diagnosis not present

## 2015-10-12 ENCOUNTER — Other Ambulatory Visit: Payer: Self-pay | Admitting: *Deleted

## 2015-10-12 DIAGNOSIS — Z7982 Long term (current) use of aspirin: Secondary | ICD-10-CM | POA: Diagnosis not present

## 2015-10-12 DIAGNOSIS — C679 Malignant neoplasm of bladder, unspecified: Secondary | ICD-10-CM | POA: Diagnosis not present

## 2015-10-12 DIAGNOSIS — J449 Chronic obstructive pulmonary disease, unspecified: Secondary | ICD-10-CM | POA: Diagnosis not present

## 2015-10-12 DIAGNOSIS — J9611 Chronic respiratory failure with hypoxia: Secondary | ICD-10-CM | POA: Diagnosis not present

## 2015-10-12 DIAGNOSIS — Z9981 Dependence on supplemental oxygen: Secondary | ICD-10-CM | POA: Diagnosis not present

## 2015-10-12 NOTE — Patient Outreach (Signed)
[facility]) Care Management  10/12/2015  DAMIYON COGAR [birth year] [id number]  Referral from [facility]; Contact listed as daughter-Deborah Minter-[phone]  Telephone call to contact person; left message on voice mail requesting call back.  Plan: Will follow up.  Sherrin Daisy, RN BSN [place] Management Coordinator [facility] Care Management  [phone]

## 2015-10-13 ENCOUNTER — Ambulatory Visit (INDEPENDENT_AMBULATORY_CARE_PROVIDER_SITE_OTHER): Payer: Commercial Managed Care - HMO | Admitting: Family Medicine

## 2015-10-13 ENCOUNTER — Other Ambulatory Visit: Payer: Self-pay | Admitting: *Deleted

## 2015-10-13 DIAGNOSIS — J9611 Chronic respiratory failure with hypoxia: Secondary | ICD-10-CM | POA: Diagnosis not present

## 2015-10-13 DIAGNOSIS — Z7982 Long term (current) use of aspirin: Secondary | ICD-10-CM | POA: Diagnosis not present

## 2015-10-13 DIAGNOSIS — C679 Malignant neoplasm of bladder, unspecified: Secondary | ICD-10-CM | POA: Diagnosis not present

## 2015-10-13 DIAGNOSIS — J449 Chronic obstructive pulmonary disease, unspecified: Secondary | ICD-10-CM | POA: Diagnosis not present

## 2015-10-13 DIAGNOSIS — J441 Chronic obstructive pulmonary disease with (acute) exacerbation: Secondary | ICD-10-CM

## 2015-10-13 DIAGNOSIS — J9622 Acute and chronic respiratory failure with hypercapnia: Secondary | ICD-10-CM
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
## 2015-10-16 ENCOUNTER — Other Ambulatory Visit: Payer: Self-pay | Admitting: *Deleted

## 2015-10-16 NOTE — Patient Outreach (Signed)
Called and left message for pt's daughter to return my call to discuss [place] Management and when I can schedule a home visit. I left my name and number for a call back at her convenience.  [name] [facility] [phone]

## 2015-10-18 ENCOUNTER — Telehealth: Payer: Self-pay | Admitting: Family Medicine

## 2015-10-18 ENCOUNTER — Other Ambulatory Visit: Payer: Self-pay | Admitting: *Deleted

## 2015-10-18 ENCOUNTER — Other Ambulatory Visit: Payer: Self-pay | Admitting: Family Medicine

## 2015-10-18 DIAGNOSIS — J449 Chronic obstructive pulmonary disease, unspecified: Secondary | ICD-10-CM | POA: Diagnosis not present

## 2015-10-18 DIAGNOSIS — C679 Malignant neoplasm of bladder, unspecified: Secondary | ICD-10-CM | POA: Diagnosis not present

## 2015-10-18 DIAGNOSIS — Z9981 Dependence on supplemental oxygen: Secondary | ICD-10-CM | POA: Diagnosis not present

## 2015-10-18 DIAGNOSIS — Z7982 Long term (current) use of aspirin: Secondary | ICD-10-CM | POA: Diagnosis not present

## 2015-10-18 DIAGNOSIS — J9611 Chronic respiratory failure with hypoxia: Secondary | ICD-10-CM | POA: Diagnosis not present

## 2015-10-18 NOTE — Telephone Encounter (Signed)
Appointment made for 10/20/15 at 8:55 am with Dr. Wendi Snipes
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
## 2015-10-19 ENCOUNTER — Encounter: Payer: Self-pay | Admitting: Licensed Clinical Social Worker

## 2015-10-19 ENCOUNTER — Telehealth: Payer: Self-pay | Admitting: Family Medicine

## 2015-10-19 ENCOUNTER — Other Ambulatory Visit: Payer: Self-pay | Admitting: Licensed Clinical Social Worker
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
## 2015-10-19 NOTE — Telephone Encounter (Signed)
Patient's daughter informed to get Mucinex and give him this today and followup with Dr. Wendi Snipes tomorrow.
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
## 2015-10-20 ENCOUNTER — Ambulatory Visit (INDEPENDENT_AMBULATORY_CARE_PROVIDER_SITE_OTHER): Payer: Commercial Managed Care - HMO | Admitting: Family Medicine

## 2015-10-20 ENCOUNTER — Other Ambulatory Visit: Payer: Self-pay | Admitting: *Deleted

## 2015-10-20 ENCOUNTER — Encounter: Payer: Self-pay | Admitting: Family Medicine

## 2015-10-20 VITALS — BP 133/77 | HR 105 | Temp 97.0°F | Ht 65.0 in | Wt 156.4 lb

## 2015-10-20 DIAGNOSIS — T148XXA Other injury of unspecified body region, initial encounter: Secondary | ICD-10-CM

## 2015-10-20 DIAGNOSIS — I10 Essential (primary) hypertension: Secondary | ICD-10-CM | POA: Diagnosis not present

## 2015-10-20 DIAGNOSIS — J9611 Chronic respiratory failure with hypoxia: Secondary | ICD-10-CM

## 2015-10-20 DIAGNOSIS — R296 Repeated falls: Secondary | ICD-10-CM | POA: Diagnosis not present

## 2015-10-20 DIAGNOSIS — D649 Anemia, unspecified: Secondary | ICD-10-CM | POA: Diagnosis not present

## 2015-10-20 DIAGNOSIS — S50912A Unspecified superficial injury of left forearm, initial encounter: Secondary | ICD-10-CM

## 2015-10-20 LAB — CMP14+EGFR
A/G RATIO: 1.7 (ref 1.2–2.2)
ALBUMIN: 4 g/dL (ref 3.5–4.8)
ALK PHOS: 87 IU/L (ref 39–117)
ALT: 18 IU/L (ref 0–44)
AST: 13 IU/L (ref 0–40)
BILIRUBIN TOTAL: 0.2 mg/dL (ref 0.0–1.2)
BUN / CREAT RATIO: 9 — AB (ref 10–24)
BUN: 7 mg/dL — ABNORMAL LOW (ref 8–27)
CHLORIDE: 94 mmol/L — AB (ref 96–106)
CO2: 32 mmol/L — ABNORMAL HIGH (ref 18–29)
Calcium: 9.8 mg/dL (ref 8.6–10.2)
Creatinine, Ser: 0.8 mg/dL (ref 0.76–1.27)
GFR calc non Af Amer: 89 mL/min/{1.73_m2} (ref >59)
GFR, EST AFRICAN AMERICAN: 102 mL/min/{1.73_m2} (ref >59)
GLOBULIN, TOTAL: 2.4 g/dL (ref 1.5–4.5)
GLUCOSE: 112 mg/dL — AB (ref 65–99)
POTASSIUM: 4.5 mmol/L (ref 3.5–5.2)
SODIUM: 142 mmol/L (ref 134–144)
TOTAL PROTEIN: 6.4 g/dL (ref 6.0–8.5)

## 2015-10-20 LAB — CBC WITH DIFFERENTIAL
BASOS ABS: 0 10*3/uL (ref 0.0–0.2)
BASOS: 0 %
EOS (ABSOLUTE): 0.6 10*3/uL — ABNORMAL HIGH (ref 0.0–0.4)
Eos: 6 %
HEMATOCRIT: 37.3 % — AB (ref 37.5–51.0)
HEMOGLOBIN: 11.7 g/dL — AB (ref 12.6–17.7)
Immature Grans (Abs): 0 10*3/uL (ref 0.0–0.1)
Immature Granulocytes: 0 %
LYMPHS ABS: 2.7 10*3/uL (ref 0.7–3.1)
Lymphs: 24 %
MCH: 30.5 pg (ref 26.6–33.0)
MCHC: 31.4 g/dL — AB (ref 31.5–35.7)
MCV: 97 fL (ref 79–97)
MONOCYTES: 9 %
Monocytes Absolute: 1.1 10*3/uL — ABNORMAL HIGH (ref 0.1–0.9)
NEUTROS ABS: 6.7 10*3/uL (ref 1.4–7.0)
Neutrophils: 61 %
RBC: 3.84 x10E6/uL — ABNORMAL LOW (ref 4.14–5.80)
RDW: 14.9 % (ref 12.3–15.4)
WBC: 11.2 10*3/uL — ABNORMAL HIGH (ref 3.4–10.8)
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
## 2015-10-20 NOTE — Patient Instructions (Signed)
Great to see you!  We are making home health arrangements, for an aide or a sitter you will need to look nto someone you can hire unless [facility] health can offer more than thy were before.
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
## 2015-10-24 ENCOUNTER — Emergency Department (HOSPITAL_COMMUNITY): Payer: Commercial Managed Care - HMO

## 2015-10-24 ENCOUNTER — Ambulatory Visit (INDEPENDENT_AMBULATORY_CARE_PROVIDER_SITE_OTHER): Payer: Commercial Managed Care - HMO | Admitting: Nurse Practitioner

## 2015-10-24 ENCOUNTER — Inpatient Hospital Stay (HOSPITAL_COMMUNITY)
Admission: EM | Admit: 2015-10-24 | Discharge: 2015-10-27 | DRG: 190 | Disposition: A | Discharge status: Skilled Nursing Facility | Payer: Commercial Managed Care - HMO | Attending: Internal Medicine | Admitting: Internal Medicine

## 2015-10-24 ENCOUNTER — Encounter (HOSPITAL_COMMUNITY): Payer: Self-pay

## 2015-10-24 ENCOUNTER — Telehealth: Payer: Self-pay | Admitting: Family Medicine

## 2015-10-24 ENCOUNTER — Encounter: Payer: Self-pay | Admitting: Nurse Practitioner

## 2015-10-24 ENCOUNTER — Ambulatory Visit: Payer: Commercial Managed Care - HMO | Admitting: Family Medicine

## 2015-10-24 ENCOUNTER — Ambulatory Visit (INDEPENDENT_AMBULATORY_CARE_PROVIDER_SITE_OTHER): Payer: Commercial Managed Care - HMO

## 2015-10-24 VITALS — BP 114/74 | HR 101 | Temp 97.1°F

## 2015-10-24 DIAGNOSIS — J9621 Acute and chronic respiratory failure with hypoxia: Secondary | ICD-10-CM | POA: Diagnosis present

## 2015-10-24 DIAGNOSIS — E7800 Pure hypercholesterolemia, unspecified: Secondary | ICD-10-CM | POA: Diagnosis not present

## 2015-10-24 DIAGNOSIS — J441 Chronic obstructive pulmonary disease with (acute) exacerbation: Secondary | ICD-10-CM | POA: Diagnosis not present

## 2015-10-24 DIAGNOSIS — Z825 Family history of asthma and other chronic lower respiratory diseases: Secondary | ICD-10-CM | POA: Diagnosis not present

## 2015-10-24 DIAGNOSIS — F419 Anxiety disorder, unspecified: Secondary | ICD-10-CM | POA: Diagnosis present

## 2015-10-24 DIAGNOSIS — R0609 Other forms of dyspnea: Secondary | ICD-10-CM | POA: Diagnosis not present

## 2015-10-24 DIAGNOSIS — R296 Repeated falls: Secondary | ICD-10-CM

## 2015-10-24 DIAGNOSIS — J9620 Acute and chronic respiratory failure, unspecified whether with hypoxia or hypercapnia: Secondary | ICD-10-CM | POA: Diagnosis not present

## 2015-10-24 DIAGNOSIS — R062 Wheezing: Secondary | ICD-10-CM | POA: Diagnosis not present

## 2015-10-24 DIAGNOSIS — F17210 Nicotine dependence, cigarettes, uncomplicated: Secondary | ICD-10-CM | POA: Diagnosis not present

## 2015-10-24 DIAGNOSIS — J449 Chronic obstructive pulmonary disease, unspecified: Secondary | ICD-10-CM | POA: Diagnosis not present

## 2015-10-24 DIAGNOSIS — Z7951 Long term (current) use of inhaled steroids: Secondary | ICD-10-CM | POA: Diagnosis not present

## 2015-10-24 DIAGNOSIS — I2510 Atherosclerotic heart disease of native coronary artery without angina pectoris: Secondary | ICD-10-CM | POA: Diagnosis not present

## 2015-10-24 DIAGNOSIS — Z8711 Personal history of peptic ulcer disease: Secondary | ICD-10-CM

## 2015-10-24 DIAGNOSIS — Z841 Family history of disorders of kidney and ureter: Secondary | ICD-10-CM | POA: Diagnosis not present

## 2015-10-24 DIAGNOSIS — F339 Major depressive disorder, recurrent, unspecified: Secondary | ICD-10-CM | POA: Diagnosis not present

## 2015-10-24 DIAGNOSIS — J9611 Chronic respiratory failure with hypoxia: Secondary | ICD-10-CM | POA: Diagnosis not present

## 2015-10-24 DIAGNOSIS — Z7982 Long term (current) use of aspirin: Secondary | ICD-10-CM | POA: Diagnosis not present

## 2015-10-24 DIAGNOSIS — R51 Headache: Secondary | ICD-10-CM | POA: Diagnosis not present

## 2015-10-24 DIAGNOSIS — K219 Gastro-esophageal reflux disease without esophagitis: Secondary | ICD-10-CM | POA: Diagnosis not present

## 2015-10-24 DIAGNOSIS — J309 Allergic rhinitis, unspecified: Secondary | ICD-10-CM | POA: Diagnosis not present

## 2015-10-24 DIAGNOSIS — R0602 Shortness of breath: Secondary | ICD-10-CM

## 2015-10-24 DIAGNOSIS — E785 Hyperlipidemia, unspecified: Secondary | ICD-10-CM | POA: Diagnosis present

## 2015-10-24 DIAGNOSIS — R2689 Other abnormalities of gait and mobility: Secondary | ICD-10-CM | POA: Diagnosis not present

## 2015-10-24 DIAGNOSIS — Z720 Tobacco use: Secondary | ICD-10-CM

## 2015-10-24 DIAGNOSIS — J418 Mixed simple and mucopurulent chronic bronchitis: Secondary | ICD-10-CM | POA: Diagnosis not present

## 2015-10-24 DIAGNOSIS — Z9981 Dependence on supplemental oxygen: Secondary | ICD-10-CM | POA: Diagnosis not present

## 2015-10-24 DIAGNOSIS — I10 Essential (primary) hypertension: Secondary | ICD-10-CM | POA: Diagnosis present

## 2015-10-24 DIAGNOSIS — Z8551 Personal history of malignant neoplasm of bladder: Secondary | ICD-10-CM | POA: Diagnosis not present

## 2015-10-24 DIAGNOSIS — T380X5A Adverse effect of glucocorticoids and synthetic analogues, initial encounter: Secondary | ICD-10-CM | POA: Diagnosis present

## 2015-10-24 DIAGNOSIS — A419 Sepsis, unspecified organism: Secondary | ICD-10-CM | POA: Diagnosis not present

## 2015-10-24 DIAGNOSIS — R739 Hyperglycemia, unspecified: Secondary | ICD-10-CM | POA: Diagnosis present

## 2015-10-24 LAB — CBC WITH DIFFERENTIAL/PLATELET
Basophils Absolute: 0 10*3/uL (ref 0.0–0.1)
Basophils Relative: 0 %
EOS ABS: 0.4 10*3/uL (ref 0.0–0.7)
Eosinophils Relative: 4 %
HEMATOCRIT: 33 % — AB (ref 39.0–52.0)
HEMOGLOBIN: 10.4 g/dL — AB (ref 13.0–17.0)
LYMPHS ABS: 4 10*3/uL (ref 0.7–4.0)
Lymphocytes Relative: 34 %
MCH: 30 pg (ref 26.0–34.0)
MCHC: 31.5 g/dL (ref 30.0–36.0)
MCV: 95.1 fL (ref 78.0–100.0)
MONO ABS: 1.3 10*3/uL — AB (ref 0.1–1.0)
MONOS PCT: 12 %
NEUTROS PCT: 50 %
Neutro Abs: 5.8 10*3/uL (ref 1.7–7.7)
Platelets: 411 10*3/uL — ABNORMAL HIGH (ref 150–400)
RBC: 3.47 MIL/uL — ABNORMAL LOW (ref 4.22–5.81)
RDW: 15 % (ref 11.5–15.5)
WBC: 11.5 10*3/uL — ABNORMAL HIGH (ref 4.0–10.5)

## 2015-10-24 LAB — BRAIN NATRIURETIC PEPTIDE: B NATRIURETIC PEPTIDE 5: 36 pg/mL (ref 0.0–100.0)

## 2015-10-24 LAB — COMPREHENSIVE METABOLIC PANEL
ALK PHOS: 79 U/L (ref 38–126)
ALT: 18 U/L (ref 17–63)
ANION GAP: 4 — AB (ref 5–15)
AST: 21 U/L (ref 15–41)
Albumin: 3.5 g/dL (ref 3.5–5.0)
BILIRUBIN TOTAL: 0.5 mg/dL (ref 0.3–1.2)
BUN: 14 mg/dL (ref 6–20)
CALCIUM: 9.1 mg/dL (ref 8.9–10.3)
CO2: 35 mmol/L — ABNORMAL HIGH (ref 22–32)
Chloride: 96 mmol/L — ABNORMAL LOW (ref 101–111)
Creatinine, Ser: 0.82 mg/dL (ref 0.61–1.24)
GFR calc non Af Amer: >60 mL/min (ref >60)
Glucose, Bld: 122 mg/dL — ABNORMAL HIGH (ref 65–99)
POTASSIUM: 3.9 mmol/L (ref 3.5–5.1)
SODIUM: 135 mmol/L (ref 135–145)
TOTAL PROTEIN: 6.7 g/dL (ref 6.5–8.1)

## 2015-10-24 LAB — TSH: TSH: 0.589 u[IU]/mL (ref 0.350–4.500)

## 2015-10-24 LAB — MAGNESIUM: Magnesium: 1.9 mg/dL (ref 1.7–2.4)

## 2015-10-24 LAB — PHOSPHORUS: Phosphorus: 3.5 mg/dL (ref 2.5–4.6)

## 2015-10-24 LAB — I-STAT CG4 LACTIC ACID, ED: Lactic Acid, Venous: 0.82 mmol/L (ref 0.5–1.9)

## 2015-10-24 MED ORDER — ALPRAZOLAM 0.5 MG PO TABS
0.5000 mg | ORAL_TABLET | Freq: Three times a day (TID) | ORAL | Status: DC
  Administered 2015-10-24 – 2015-10-27 (×8): 0.5 mg via ORAL
  Filled 2015-10-24 (×8): qty 1

## 2015-10-24 MED ORDER — NITROGLYCERIN 0.4 MG SL SUBL: 0.4000 mg | SUBLINGUAL_TABLET | SUBLINGUAL | Status: DC | PRN

## 2015-10-24 MED ORDER — PANTOPRAZOLE SODIUM 40 MG PO TBEC
80.0000 mg | DELAYED_RELEASE_TABLET | Freq: Every day | ORAL | Status: DC
  Administered 2015-10-25 – 2015-10-27 (×3): 80 mg via ORAL
  Filled 2015-10-24 (×3): qty 2

## 2015-10-24 MED ORDER — CITALOPRAM HYDROBROMIDE 20 MG PO TABS
20.0000 mg | ORAL_TABLET | Freq: Every morning | ORAL | Status: DC
  Administered 2015-10-25 – 2015-10-27 (×3): 20 mg via ORAL
  Filled 2015-10-24 (×3): qty 1

## 2015-10-24 MED ORDER — SIMVASTATIN 20 MG PO TABS
40.0000 mg | ORAL_TABLET | Freq: Every day | ORAL | Status: DC
  Administered 2015-10-24 – 2015-10-26 (×3): 40 mg via ORAL
  Filled 2015-10-24 (×3): qty 2

## 2015-10-24 MED ORDER — IPRATROPIUM-ALBUTEROL 0.5-2.5 (3) MG/3ML IN SOLN
3.0000 mL | Freq: Four times a day (QID) | RESPIRATORY_TRACT | Status: DC
  Administered 2015-10-24 – 2015-10-27 (×11): 3 mL via RESPIRATORY_TRACT
  Filled 2015-10-24 (×10): qty 3

## 2015-10-24 MED ORDER — ASPIRIN EC 81 MG PO TBEC
81.0000 mg | DELAYED_RELEASE_TABLET | Freq: Every morning | ORAL | Status: DC
  Administered 2015-10-25 – 2015-10-27 (×3): 81 mg via ORAL
  Filled 2015-10-24 (×3): qty 1

## 2015-10-24 MED ORDER — ALBUTEROL SULFATE (2.5 MG/3ML) 0.083% IN NEBU
2.5000 mg | INHALATION_SOLUTION | Freq: Once | RESPIRATORY_TRACT | Status: AC
  Administered 2015-10-24: 2.5 mg via RESPIRATORY_TRACT
  Filled 2015-10-24: qty 3

## 2015-10-24 MED ORDER — MONTELUKAST SODIUM 10 MG PO TABS
10.0000 mg | ORAL_TABLET | Freq: Every day | ORAL | Status: DC
  Administered 2015-10-24 – 2015-10-26 (×3): 10 mg via ORAL
  Filled 2015-10-24 (×3): qty 1

## 2015-10-24 MED ORDER — DOCUSATE SODIUM 100 MG PO CAPS: 100.0000 mg | ORAL_CAPSULE | Freq: Every day | ORAL | Status: DC | PRN

## 2015-10-24 MED ORDER — ADULT MULTIVITAMIN W/MINERALS CH
1.0000 | ORAL_TABLET | Freq: Every day | ORAL | Status: DC
  Administered 2015-10-25 – 2015-10-27 (×3): 1 via ORAL
  Filled 2015-10-24 (×3): qty 1

## 2015-10-24 MED ORDER — METHYLPREDNISOLONE SODIUM SUCC 40 MG IJ SOLR
40.0000 mg | Freq: Two times a day (BID) | INTRAMUSCULAR | Status: DC
  Administered 2015-10-25: 40 mg via INTRAVENOUS
  Filled 2015-10-24: qty 1

## 2015-10-24 MED ORDER — BUDESONIDE 0.25 MG/2ML IN SUSP
0.2500 mg | Freq: Two times a day (BID) | RESPIRATORY_TRACT | Status: DC
  Administered 2015-10-24 – 2015-10-27 (×6): 0.25 mg via RESPIRATORY_TRACT
  Filled 2015-10-24 (×6): qty 2

## 2015-10-24 MED ORDER — LEVOFLOXACIN IN D5W 750 MG/150ML IV SOLN
750.0000 mg | INTRAVENOUS | Status: DC
  Administered 2015-10-24 – 2015-10-26 (×3): 750 mg via INTRAVENOUS
  Filled 2015-10-24: qty 150

## 2015-10-24 MED ORDER — IPRATROPIUM-ALBUTEROL 0.5-2.5 (3) MG/3ML IN SOLN
3.0000 mL | RESPIRATORY_TRACT | Status: DC | PRN
  Filled 2015-10-24: qty 3

## 2015-10-24 MED ORDER — VITAMIN B-12 1000 MCG PO TABS
500.0000 ug | ORAL_TABLET | Freq: Every morning | ORAL | Status: DC
  Administered 2015-10-25 – 2015-10-27 (×3): 500 ug via ORAL
  Filled 2015-10-24 (×3): qty 1

## 2015-10-24 MED ORDER — LORATADINE 10 MG PO TABS
10.0000 mg | ORAL_TABLET | Freq: Every morning | ORAL | Status: DC
  Administered 2015-10-25 – 2015-10-27 (×3): 10 mg via ORAL
  Filled 2015-10-24 (×3): qty 1

## 2015-10-24 MED ORDER — ENOXAPARIN SODIUM 40 MG/0.4ML ~~LOC~~ SOLN
40.0000 mg | SUBCUTANEOUS | Status: DC
  Administered 2015-10-24 – 2015-10-26 (×3): 40 mg via SUBCUTANEOUS
  Filled 2015-10-24 (×3): qty 0.4

## 2015-10-24 MED ORDER — METHYLPREDNISOLONE SODIUM SUCC 125 MG IJ SOLR
125.0000 mg | Freq: Once | INTRAMUSCULAR | Status: AC
  Administered 2015-10-24: 125 mg via INTRAVENOUS
  Filled 2015-10-24: qty 2

## 2015-10-24 MED ORDER — IPRATROPIUM-ALBUTEROL 0.5-2.5 (3) MG/3ML IN SOLN
3.0000 mL | Freq: Once | RESPIRATORY_TRACT | Status: AC
  Administered 2015-10-24: 3 mL via RESPIRATORY_TRACT
  Filled 2015-10-24: qty 3

## 2015-10-24 NOTE — ED Notes (Addendum)
Pt brought in by EMS. Sent from [facility] Internal medicine due to low sats 86% and wheezing. Pt states he went in to be seen for pain in right side. And xrays were negative.[facility] reports given duoneb en route with 6 L O2. Pt reports he has been coughing for a week. Pt reports he wears O2 @4  L at home

## 2015-10-24 NOTE — ED Notes (Signed)
Respiratory notified of new orders

## 2015-10-24 NOTE — ED Provider Notes (Signed)
CSN: [id number]     Arrival date & time 10/24/15  1605 History   First MD Initiated Contact with Patient 10/24/15 1615     Chief Complaint  Patient presents with  . Shortness of Breath     (Consider location/radiation/quality/duration/timing/severity/associated sxs/prior Treatment) Patient is a 75 y.o. male presenting with shortness of breath. The history is provided by the patient (Patient complains of shortness of breath. Cough wheezing).  Shortness of Breath Severity:  Moderate Onset quality:  Sudden Timing:  Constant Progression:  Worsening Chronicity:  Recurrent Context: activity   Associated symptoms: wheezing   Associated symptoms: no abdominal pain, no chest pain, no cough, no headaches and no rash     Past Medical History  Diagnosis Date  . COPD (chronic obstructive pulmonary disease) ([place])   . Asthma   . Tobacco abuse 09/29/2011  . Hypercholesteremia   . GERD (gastroesophageal reflux disease)   . Headache(784.0)   . BPH (benign prostatic hyperplasia) 10/01/2011    Per cystoscopy  . 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  . Pulmonary nodule 09/2011    Stable appearance  . Chronic back pain   . Chronic anxiety   . Hyperlipidemia 03/13/2012  . Anxiety 03/13/2012  . On home O2     2L N/C continuous  . Emphysema lung ([place])   . Depression   . History of stomach ulcers    Past Surgical History  Procedure Laterality Date  . Cystoscopy  09/30/2011    Procedure: CYSTOSCOPY FLEXIBLE;  Surgeon: Marissa Nestle, MD;  Location: [facility];  Service: Urology;  Laterality: N/A;  . Transurethral resection of bladder tumor  10/01/2011    Procedure: TRANSURETHRAL RESECTION OF BLADDER TUMOR (TURBT);  Surgeon: Marissa Nestle, MD;  Location: [facility];  Service: Urology;  Laterality: N/A;  . Cardiac catheterization  2003    [facility]  . Fracture surgery      R ring finger, pin placed  . Cholecystectomy     Family History  Problem Relation Age of Onset  .  Emphysema Brother     was not close to family; does not truly know their medical problems.  . Emphysema Brother   . Emphysema Brother   . Cancer Neg Hx   . Kidney failure Mother    Social History  Substance Use Topics  . Smoking status: Current Every Day Smoker -- 0.50 packs/day for 58 years    Types: Cigarettes  . Smokeless tobacco: None  . Alcohol Use: No    Review of Systems  Constitutional: Negative for appetite change and fatigue.  HENT: Negative for congestion, ear discharge and sinus pressure.   Eyes: Negative for discharge.  Respiratory: Positive for shortness of breath and wheezing. Negative for cough.   Cardiovascular: Negative for chest pain.  Gastrointestinal: Negative for abdominal pain and diarrhea.  Genitourinary: Negative for frequency and hematuria.  Musculoskeletal: Negative for back pain.  Skin: Negative for rash.  Neurological: Negative for seizures and headaches.  Psychiatric/Behavioral: Negative for hallucinations.      Allergies  Iohexol and Ivp dye  Home Medications   Prior to Admission medications   Medication Sig Start Date End Date Taking? Authorizing Provider  albuterol (PROVENTIL HFA;VENTOLIN HFA) 108 (90 Base) MCG/ACT inhaler Inhale 1-2 puffs into the lungs every 6 (six) hours as needed for wheezing or shortness of breath.   Yes Historical Provider, MD  albuterol (PROVENTIL) (2.5 MG/3ML) 0.083% nebulizer solution Take 2.5 mg by nebulization every 4 (four)  hours as needed. For shortness of breath   Yes Historical Provider, MD  ALPRAZolam Duanne Moron) 1 MG tablet Take 1 mg by mouth 4 (four) times daily. anxiety 02/20/13  Yes Domenic Polite, MD  aspirin EC 81 MG tablet Take 81 mg by mouth every morning.  10/02/11  Yes Rexene Alberts, MD  budesonide-formoterol [facility]) 160-4.5 MCG/ACT inhaler Inhale 2 puffs into the lungs 2 (two) times daily. 03/30/13  Yes Rexene Alberts, MD  citalopram (CELEXA) 20 MG tablet Take 1 tablet by mouth every morning.  08/14/15   Yes Historical Provider, MD  COMBIVENT RESPIMAT 20-100 MCG/ACT AERS respimat Inhale 1 spray into the lungs 4 (four) times daily. 10/12/15  Yes Historical Provider, MD  cyanocobalamin 500 MCG tablet Take 500 mcg by mouth every morning.   Yes Historical Provider, MD  DALIRESP 500 MCG TABS tablet Take 500 mcg by mouth every morning.  01/26/14  Yes Historical Provider, MD  docusate sodium (COLACE) 100 MG capsule Take 100 mg by mouth daily as needed for mild constipation or moderate constipation.    Yes Historical Provider, MD  DULoxetine (CYMBALTA) 60 MG capsule Take 1 capsule (60 mg total) by mouth daily. Patient taking differently: Take 1 capsule (60 mg total) by mouth daily in the morning 10/18/15  Yes Timmothy Euler, MD  esomeprazole (NEXIUM) 40 MG capsule Take 40 mg by mouth every morning.   Yes Historical Provider, MD  GuaiFENesin (MUCINEX MAXIMUM STRENGTH PO) Take 1 tablet by mouth daily as needed (for congestion).   Yes Historical Provider, MD  loratadine (CLARITIN) 10 MG tablet Take 10 mg by mouth every morning.    Yes Historical Provider, MD  Multiple Vitamin (MULTIVITAMIN WITH MINERALS) TABS tablet Take 1 tablet by mouth daily.   Yes Historical Provider, MD  NITROSTAT 0.4 MG SL tablet Place 1 tablet under the tongue every 5 (five) minutes as needed for chest pain.  02/22/13  Yes Historical Provider, MD  Omega-3 Fatty Acids (FISH OIL) 1000 MG CAPS Take 3,000 mg by mouth every morning.   Yes Historical Provider, MD  simvastatin (ZOCOR) 40 MG tablet Take 40 mg by mouth at bedtime.    Yes Historical Provider, MD  guaiFENesin (MUCINEX) 600 MG 12 hr tablet Take 1 tablet (600 mg total) by mouth 2 (two) times daily. Patient not taking: Reported on 10/24/2015 09/28/15   Kathie Dike, MD   BP 126/82 mmHg  Pulse 94  Temp(Src) 98 F (36.7 C) (Oral)  Resp 21  Ht 5\' 5"  (1.651 m)  Wt 155 lb (70.308 kg)  BMI 25.79 kg/m2  SpO2 96% Physical Exam  Constitutional: He is oriented to person, place, and  time. He appears well-developed.  HENT:  Head: Normocephalic.  Eyes: Conjunctivae and EOM are normal. No scleral icterus.  Neck: Neck supple. No thyromegaly present.  Cardiovascular: Normal rate and regular rhythm.  Exam reveals no gallop and no friction rub.   No murmur heard. Pulmonary/Chest: No stridor. He has wheezes. He has no rales. He exhibits no tenderness.  Abdominal: He exhibits no distension. There is no tenderness. There is no rebound.  Musculoskeletal: Normal range of motion. He exhibits no edema.  Lymphadenopathy:    He has no cervical adenopathy.  Neurological: He is oriented to person, place, and time. He exhibits normal muscle tone. Coordination normal.  Skin: No rash noted. No erythema.  Psychiatric: He has a normal mood and affect. His behavior is normal.    ED Course  Procedures (including critical care time) Labs  Review Labs Reviewed  CBC WITH DIFFERENTIAL/PLATELET - Abnormal; Notable for the following:    WBC 11.5 (*)    RBC 3.47 (*)    Hemoglobin 10.4 (*)    HCT 33.0 (*)    Platelets 411 (*)    Monocytes Absolute 1.3 (*)    All other components within normal limits  COMPREHENSIVE METABOLIC PANEL - Abnormal; Notable for the following:    Chloride 96 (*)    CO2 35 (*)    Glucose, Bld 122 (*)    Anion gap 4 (*)    All other components within normal limits  BRAIN NATRIURETIC PEPTIDE  I-STAT CG4 LACTIC ACID, ED    Imaging Review Dg Chest 2 View  10/24/2015  CLINICAL DATA:  Shortness of breath, asthma, COPD EXAM: CHEST  2 VIEW COMPARISON:  10/24/2015 FINDINGS: Cardiomediastinal silhouette is stable. Mild interstitial prominence bilaterally again noted. No infiltrate or pulmonary edema. Mild thoracic spine osteopenia. IMPRESSION: No active disease. Stable chronic mild interstitial prominence. No superimposed infiltrate. Electronically Signed   By: Lahoma Crocker M.D.   On: 10/24/2015 18:16   Dg Chest 2 View  10/24/2015  CLINICAL DATA:  Fall several days ago  with left-sided chest pain, initial encounter EXAM: CHEST  2 VIEW COMPARISON:  09/24/2015 FINDINGS: Cardiac shadow is within normal limits. The lungs are well aerated without focal infiltrate or sizable effusion. Mild chronic interstitial changes are seen. No pneumothorax is noted. No acute bony abnormality seen. IMPRESSION: No acute abnormality noted. Electronically Signed   By: Inez Catalina M.D.   On: 10/24/2015 16:03   I have personally reviewed and evaluated these images and lab results as part of my medical decision-making.   EKG Interpretation   Date/Time:  Tuesday October 24 2015 16:18:48 EDT Ventricular Rate:  96 PR Interval:    QRS Duration: 57 QT Interval:  359 QTC Calculation: 454 R Axis:   93 Text Interpretation:  Sinus rhythm Right axis deviation ST elevation,  consider inferior injury Baseline wander in lead(s) V3 Confirmed by Juanmanuel Marohl   MD, Alfredo Collymore ([id number]) on 10/24/2015 6:24:40 PM      MDM   Final diagnoses:  COPD exacerbation ([place])    Patient with exacerbation of COPD. He will be admitted for neb treatments and steroids    Milton Ferguson, MD 10/24/15 [phone]

## 2015-10-24 NOTE — H&P (Signed)
History and Physical  [name] [name] [id number] DOB: 02-Apr-1941 DOA: 10/24/2015  Referring physician: ER Physician PCP: Kenn File, MD  Outpatient Specialists:    Patient coming from: Home  Chief Complaint: SOB  HPI: 75 year old Caucasian male with history of COPD on home oxygen 4L/min, tobacco abuse, continuous amongst other past medical history. Patient presents with one day history of SOB, wheezing and low O2 sat (87%). No associated fever, but patient reports having chills, with cough that is productive of yellowish sputum. No headache, no neck pain, no chest pain, no nausea, no vomiting or abdominal pain and no urinary symptoms.  ED Course: Treated with Neb treatment  Pertinent labs: CO2 of 35 EKG: Independently reviewed.  Imaging: independently reviewed.   Review of Systems: As in HPI. Negative for fever, visual changes, sore throat, rash, new muscle aches, chest pain, dysuria, bleeding, n/v/abdominal pain.  Past Medical History  Diagnosis Date  . COPD (chronic obstructive pulmonary disease) ([place])   . Asthma   . Tobacco abuse 09/29/2011  . Hypercholesteremia   . GERD (gastroesophageal reflux disease)   . Headache(784.0)   . BPH (benign prostatic hyperplasia) 10/01/2011    Per cystoscopy  . 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  . Pulmonary nodule 09/2011    Stable appearance  . Chronic back pain   . Chronic anxiety   . Hyperlipidemia 03/13/2012  . Anxiety 03/13/2012  . On home O2     2L N/C continuous  . Emphysema lung ([place])   . Depression   . History of stomach ulcers     Past Surgical History  Procedure Laterality Date  . Cystoscopy  09/30/2011    Procedure: CYSTOSCOPY FLEXIBLE;  Surgeon: Marissa Nestle, MD;  Location: [facility];  Service: Urology;  Laterality: N/A;  . Transurethral resection of bladder tumor  10/01/2011    Procedure: TRANSURETHRAL RESECTION OF BLADDER TUMOR (TURBT);  Surgeon: Marissa Nestle, MD;  Location: [facility]  ORS;  Service: Urology;  Laterality: N/A;  . Cardiac catheterization  2003    [facility]  . Fracture surgery      R ring finger, pin placed  . Cholecystectomy       reports that he has been smoking Cigarettes.  He has a 29 pack-year smoking history. He does not have any smokeless tobacco history on file. He reports that he does not drink alcohol or use illicit drugs.  Allergies  Allergen Reactions  . Iohexol Hives     Code: HIVES, Desc: PT STATES HE BROKE OUT IN HIVES AND RASH AFTER CT NECK EARLY SEPT 2011; NO RESP PROBLEMS; NEEDS PRE-MEDS; MKS, Onset Date: [id number]   . Ivp Dye [Iodinated Diagnostic Agents] Hives    Family History  Problem Relation Age of Onset  . Emphysema Brother     was not close to family; does not truly know their medical problems.  . Emphysema Brother   . Emphysema Brother   . Cancer Neg Hx   . Kidney failure Mother      Prior to Admission medications   Medication Sig Start Date End Date Taking? Authorizing Provider  albuterol (PROVENTIL HFA;VENTOLIN HFA) 108 (90 Base) MCG/ACT inhaler Inhale 1-2 puffs into the lungs every 6 (six) hours as needed for wheezing or shortness of breath.   Yes Historical Provider, MD  albuterol (PROVENTIL) (2.5 MG/3ML) 0.083% nebulizer solution Take 2.5 mg by nebulization every 4 (four) hours as needed. For shortness of breath   Yes Historical Provider,  MD  ALPRAZolam (XANAX) 1 MG tablet Take 1 mg by mouth 4 (four) times daily. anxiety 02/20/13  Yes Domenic Polite, MD  aspirin EC 81 MG tablet Take 81 mg by mouth every morning.  10/02/11  Yes Rexene Alberts, MD  budesonide-formoterol [facility]) 160-4.5 MCG/ACT inhaler Inhale 2 puffs into the lungs 2 (two) times daily. 03/30/13  Yes Rexene Alberts, MD  citalopram (CELEXA) 20 MG tablet Take 1 tablet by mouth every morning.  08/14/15  Yes Historical Provider, MD  COMBIVENT RESPIMAT 20-100 MCG/ACT AERS respimat Inhale 1 spray into the lungs 4 (four) times daily. 10/12/15  Yes Historical Provider,  MD  cyanocobalamin 500 MCG tablet Take 500 mcg by mouth every morning.   Yes Historical Provider, MD  DALIRESP 500 MCG TABS tablet Take 500 mcg by mouth every morning.  01/26/14  Yes Historical Provider, MD  docusate sodium (COLACE) 100 MG capsule Take 100 mg by mouth daily as needed for mild constipation or moderate constipation.    Yes Historical Provider, MD  DULoxetine (CYMBALTA) 60 MG capsule Take 1 capsule (60 mg total) by mouth daily. Patient taking differently: Take 1 capsule (60 mg total) by mouth daily in the morning 10/18/15  Yes Timmothy Euler, MD  esomeprazole (NEXIUM) 40 MG capsule Take 40 mg by mouth every morning.   Yes Historical Provider, MD  GuaiFENesin (MUCINEX MAXIMUM STRENGTH PO) Take 1 tablet by mouth daily as needed (for congestion).   Yes Historical Provider, MD  loratadine (CLARITIN) 10 MG tablet Take 10 mg by mouth every morning.    Yes Historical Provider, MD  Multiple Vitamin (MULTIVITAMIN WITH MINERALS) TABS tablet Take 1 tablet by mouth daily.   Yes Historical Provider, MD  NITROSTAT 0.4 MG SL tablet Place 1 tablet under the tongue every 5 (five) minutes as needed for chest pain.  02/22/13  Yes Historical Provider, MD  Omega-3 Fatty Acids (FISH OIL) 1000 MG CAPS Take 3,000 mg by mouth every morning.   Yes Historical Provider, MD  simvastatin (ZOCOR) 40 MG tablet Take 40 mg by mouth at bedtime.    Yes Historical Provider, MD    Physical Exam: Filed Vitals:   10/24/15 1800 10/24/15 1830 10/24/15 1853 10/24/15 1900  BP: 132/88 128/87  126/82  Pulse: 95 95  94  Temp:      TempSrc:      Resp:    21  Height:      Weight:      SpO2: 94% 92% 95% 96%   Constitutional:  . Appearing chronically ill. In mild respiratory distress. AAO X 3.  Eyes:  . No pallor. No jaundice.  ENMT:  . external ears, nose appear normal Neck:  . Neck is supple. No JVD Respiratory:  . Decreased air entry globally, with expiratory wheeze.  Cardiovascular:  . S1S2 . No LE extremity  edema   Abdomen:  . Abdomen is soft and non tender. Organs are difficult to assess. Neurologic:  . Awake and alert. . Moves all limbs.  Wt Readings from Last 3 Encounters:  10/24/15 70.308 kg (155 lb)  10/20/15 70.943 kg (156 lb 6.4 oz)  09/28/15 73.4 kg (161 lb 13.1 oz)    I have personally reviewed following labs and imaging studies  Labs on Admission:  CBC:  Recent Labs Lab 10/20/15 0947 10/24/15 1630  WBC 11.2* 11.5*  NEUTROABS 6.7 5.8  HGB  --  10.4*  HCT 37.3* 33.0*  MCV 97 95.1  PLT  --  411*  Basic Metabolic Panel:  Recent Labs Lab 10/20/15 0947 10/24/15 1630  NA 142 135  K 4.5 3.9  CL 94* 96*  CO2 32* 35*  GLUCOSE 112* 122*  BUN 7* 14  CREATININE 0.80 0.82  CALCIUM 9.8 9.1   Liver Function Tests:  Recent Labs Lab 10/20/15 0947 10/24/15 1630  AST 13 21  ALT 18 18  ALKPHOS 87 79  BILITOT 0.2 0.5  PROT 6.4 6.7  ALBUMIN 4.0 3.5   No results for input(s): LIPASE, AMYLASE in the last 168 hours. No results for input(s): AMMONIA in the last 168 hours. Coagulation Profile: No results for input(s): INR, PROTIME in the last 168 hours. Cardiac Enzymes: No results for input(s): CKTOTAL, CKMB, CKMBINDEX, TROPONINI in the last 168 hours. BNP (last 3 results) No results for input(s): PROBNP in the last 8760 hours. HbA1C: No results for input(s): HGBA1C in the last 72 hours. CBG: No results for input(s): GLUCAP in the last 168 hours. Lipid Profile: No results for input(s): CHOL, HDL, LDLCALC, TRIG, CHOLHDL, LDLDIRECT in the last 72 hours. Thyroid Function Tests: No results for input(s): TSH, T4TOTAL, FREET4, T3FREE, THYROIDAB in the last 72 hours. Anemia Panel: No results for input(s): VITAMINB12, FOLATE, FERRITIN, TIBC, IRON, RETICCTPCT in the last 72 hours. Urine analysis:    Component Value Date/Time   COLORURINE YELLOW 09/24/2015 1951   APPEARANCEUR CLEAR 09/24/2015 1951   LABSPEC 1.010 09/24/2015 1951   PHURINE 7.0 09/24/2015 1951    GLUCOSEU NEGATIVE 09/24/2015 1951   HGBUR TRACE* 09/24/2015 1951   BILIRUBINUR NEGATIVE 09/24/2015 1951   KETONESUR NEGATIVE 09/24/2015 1951   PROTEINUR NEGATIVE 09/24/2015 1951   UROBILINOGEN 0.2 05/04/2014 0956   NITRITE NEGATIVE 09/24/2015 1951   LEUKOCYTESUR NEGATIVE 09/24/2015 1951   Sepsis Labs: @LABRCNTIP (procalcitonin:4,lacticidven:4) )No results found for this or any previous visit (from the past 240 hour(s)).    Radiological Exams on Admission: Dg Chest 2 View  10/24/2015  CLINICAL DATA:  Shortness of breath, asthma, COPD EXAM: CHEST  2 VIEW COMPARISON:  10/24/2015 FINDINGS: Cardiomediastinal silhouette is stable. Mild interstitial prominence bilaterally again noted. No infiltrate or pulmonary edema. Mild thoracic spine osteopenia. IMPRESSION: No active disease. Stable chronic mild interstitial prominence. No superimposed infiltrate. Electronically Signed   By: Lahoma Crocker M.D.   On: 10/24/2015 18:16   Dg Chest 2 View  10/24/2015  CLINICAL DATA:  Fall several days ago with left-sided chest pain, initial encounter EXAM: CHEST  2 VIEW COMPARISON:  09/24/2015 FINDINGS: Cardiac shadow is within normal limits. The lungs are well aerated without focal infiltrate or sizable effusion. Mild chronic interstitial changes are seen. No pneumothorax is noted. No acute bony abnormality seen. IMPRESSION: No acute abnormality noted. Electronically Signed   By: Inez Catalina M.D.   On: 10/24/2015 16:03    EKG: Independently reviewed.   Active Problems:   Acute on chronic respiratory failure (HCC)   Assessment/Plan 1. Acute on chronic respiratory failure 2. COPD with exacerbation 3. Tobacco use, continuous   Admit patient to telemetry unit  IV Solumedrol  Neb treatment  IV antibiotics  Counseled to quit smoking  Sputum culture  DVT prophylaxis: [place] Lovenox Code Status: Full Family Communication:  Disposition Plan: Home eventually   Consults called: Low threshold to consult  pulmonary   Admission status: inpatient    Time spent: 60 minutes  Dana Allan, MD  [facility] Pager #: [phone] 7PM-7AM contact night coverage as above   10/24/2015, 7:58 PM

## 2015-10-24 NOTE — Progress Notes (Addendum)
   Subjective:    Patient ID: [name] [name], male    DOB: 1940-09-09, 75 y.o.   MRN: [id number]  HPI Patient brought in by daughter stating that patient fell Sunday and is now c/o right flank pain- says it hurts to move or turn over. His arms are all cut up from scraping them on things. His daughter says he falls frequently. He says that he has no strength in his legs.   * he has COPD and is on constant O2@ 4l and he stays around 91% sat- he denies SOB.  Review of Systems  Respiratory: Negative.   Cardiovascular: Negative.   Genitourinary: Negative.   Neurological: Positive for weakness. Negative for dizziness.  Psychiatric/Behavioral: Negative.   All other systems reviewed and are negative.      Objective:   Physical Exam  Constitutional: He is oriented to person, place, and time. He appears well-developed and well-nourished.  Cardiovascular: Normal rate, regular rhythm and normal heart sounds.   Pulmonary/Chest: He has wheezes (tight insp and exp wheezes through out).  Use of abdominal muscles to breathe  Neurological: He is alert and oriented to person, place, and time.  Skin: Skin is warm.  Psychiatric: He has a normal mood and affect. His behavior is normal. Judgment and thought content normal.   BP 114/74 mmHg  Pulse 101  Temp(Src) 97.1 F (36.2 C) (Oral)  Ht   Wt   SpO2 86%   Chest xray- chronic bronchitic changes-Preliminary reading by Ronnald Collum, FNP  [facility]        Assessment & Plan:   1. Mixed simple and mucopurulent chronic bronchitis ([place])    To ER via ambulance contrinue O2 at [place] for now RTO prn  Mary-Margaret Hassell Done, FNP

## 2015-10-25 ENCOUNTER — Encounter (HOSPITAL_COMMUNITY): Payer: Self-pay

## 2015-10-25 ENCOUNTER — Inpatient Hospital Stay (HOSPITAL_COMMUNITY): Payer: Commercial Managed Care - HMO

## 2015-10-25 ENCOUNTER — Ambulatory Visit: Payer: Commercial Managed Care - HMO | Admitting: Family Medicine

## 2015-10-25 DIAGNOSIS — R0602 Shortness of breath: Secondary | ICD-10-CM

## 2015-10-25 LAB — MRSA PCR SCREENING: MRSA by PCR: NEGATIVE

## 2015-10-25 LAB — BLOOD GAS, ARTERIAL
Acid-Base Excess: 5.2 mmol/L — ABNORMAL HIGH (ref 0.0–2.0)
BICARBONATE: 29.1 meq/L — AB (ref 20.0–24.0)
DRAWN BY: 277331
O2 CONTENT: 3.5 L/min
O2 Saturation: 97.7 %
PCO2 ART: 39 mmHg (ref 35.0–45.0)
PH ART: 7.48 — AB (ref 7.350–7.450)
PO2 ART: 86.9 mmHg (ref 80.0–100.0)
Patient temperature: 37
TCO2: 39 mmol/L (ref 0–100)

## 2015-10-25 LAB — COMPREHENSIVE METABOLIC PANEL
ALT: 15 U/L — ABNORMAL LOW (ref 17–63)
AST: 17 U/L (ref 15–41)
Albumin: 3.4 g/dL — ABNORMAL LOW (ref 3.5–5.0)
Alkaline Phosphatase: 69 U/L (ref 38–126)
Anion gap: 10 (ref 5–15)
BUN: 14 mg/dL (ref 6–20)
CO2: 30 mmol/L (ref 22–32)
Calcium: 9.2 mg/dL (ref 8.9–10.3)
Chloride: 96 mmol/L — ABNORMAL LOW (ref 101–111)
Creatinine, Ser: 0.68 mg/dL (ref 0.61–1.24)
GFR calc Af Amer: >60 mL/min (ref >60)
GFR calc non Af Amer: >60 mL/min (ref >60)
Glucose, Bld: 143 mg/dL — ABNORMAL HIGH (ref 65–99)
Potassium: 4.2 mmol/L (ref 3.5–5.1)
Sodium: 136 mmol/L (ref 135–145)
Total Bilirubin: 0.4 mg/dL (ref 0.3–1.2)
Total Protein: 6.5 g/dL (ref 6.5–8.1)

## 2015-10-25 LAB — CBC
HCT: 31.1 % — ABNORMAL LOW (ref 39.0–52.0)
Hemoglobin: 10.2 g/dL — ABNORMAL LOW (ref 13.0–17.0)
MCH: 30.7 pg (ref 26.0–34.0)
MCHC: 32.8 g/dL (ref 30.0–36.0)
MCV: 93.7 fL (ref 78.0–100.0)
Platelets: 405 10*3/uL — ABNORMAL HIGH (ref 150–400)
RBC: 3.32 MIL/uL — ABNORMAL LOW (ref 4.22–5.81)
RDW: 14.8 % (ref 11.5–15.5)
WBC: 6.6 10*3/uL (ref 4.0–10.5)

## 2015-10-25 MED ORDER — TECHNETIUM TC 99M DIETHYLENETRIAME-PENTAACETIC ACID
30.0000 | Freq: Once | INTRAVENOUS | Status: AC | PRN
  Administered 2015-10-25: 30 via RESPIRATORY_TRACT

## 2015-10-25 MED ORDER — METHYLPREDNISOLONE SODIUM SUCC 125 MG IJ SOLR
80.0000 mg | Freq: Four times a day (QID) | INTRAMUSCULAR | Status: DC
  Administered 2015-10-25 – 2015-10-27 (×9): 80 mg via INTRAVENOUS
  Filled 2015-10-25 (×9): qty 2

## 2015-10-25 MED ORDER — ALPRAZOLAM 0.5 MG PO TABS
0.5000 mg | ORAL_TABLET | Freq: Once | ORAL | Status: AC
  Administered 2015-10-25: 0.5 mg via ORAL
  Filled 2015-10-25: qty 1

## 2015-10-25 MED ORDER — ACETAMINOPHEN 325 MG PO TABS
650.0000 mg | ORAL_TABLET | Freq: Four times a day (QID) | ORAL | Status: DC | PRN
  Administered 2015-10-25: 650 mg via ORAL
  Filled 2015-10-25: qty 2

## 2015-10-25 MED ORDER — TECHNETIUM TO 99M ALBUMIN AGGREGATED
4.0000 | Freq: Once | INTRAVENOUS | Status: AC | PRN
  Administered 2015-10-25: 4 via INTRAVENOUS

## 2015-10-25 NOTE — Progress Notes (Signed)
[facility] PROGRESS NOTE  [name] [name] [id number] DOB: 1940-10-02 DOA: 10/24/2015   PCP: Kenn File, MD     Assessment/Plan: Active Problems:   Acute on chronic respiratory failure ([place])   Shortness of breath   75 year old Caucasian male with history of COPD on home oxygen 4L/min, tobacco abuse, continuous amongst other past medical history. Patient presents with one day history of SOB, wheezing and low O2 sat (87%). No associated fever, but patient reports having chills, with cough that is productive of yellowish sputum. Patient was recently admitted and discharged on 6/15 for COPD exacerbation, hypercapnic respiratory failure requiring BiPAP support. Chest x-ray on 7/11 does not show any active disease but chronic mild interstitial prominence  Assessment and plan  Acute on chronic respiratory failure with hypoxia: Secondary to acute COPD exacerbation. Third admission this year. Currently requiring  3-4 L, increased dose of IV Solu-Medrol, scheduled nebulizers and Levaquin. Pulmonary consult. ABG, may need BiPAP support. Transferred to stepdown. VQ scan to rule out pulmonary embolism given recent hospitalization, venous Doppler to rule out DVT     COPD with acute exacerbation: Patient is on home oxygen at 4 L. Will get the sense disease completely compliant with all his medications. He continues to smoke. See #1. Continue Pulmicort, Claritin, Singulair, pulmonary consult, increased Solu-Medrol, continue Levaquin.    Essential hypertension: Controlled. Continue home medications   Tobacco abuse: Cessation counseling offered.    Hyperglycemia: Likely related to steroids will monitor and use sliding scale insulin as indicated    DVT prophylaxsis Lovenox  Code Status:  Full code    Family Communication: Discussed in detail with the patient, all imaging results, lab results explained to the patient   Disposition Plan: Anticipate discharge in 2-3  days     Consultants: Pulmonary   Procedures: None   Antibiotics: Anti-infectives    Start     Dose/Rate Route Frequency Ordered Stop   10/24/15 2200  levofloxacin (LEVAQUIN) IVPB 750 mg     750 mg 100 mL/hr over 90 Minutes Intravenous Every 24 hours 10/24/15 2123           HPI/Subjective: Awake Alert, patient does not have any significant shortness of breath on exam  Objective: Filed Vitals:   10/25/15 0243 10/25/15 0602 10/25/15 0802 10/25/15 0809  BP:  114/57    Pulse:  86    Temp:  98 F (36.7 C)    TempSrc:  Oral    Resp:  20    Height:      Weight:      SpO2: 94% 94% 95% 97%    Intake/Output Summary (Last 24 hours) at 10/25/15 0846 Last data filed at 10/25/15 0602  Gross per 24 hour  Intake      0 ml  Output    400 ml  Net   -400 ml    Exam:  Examination:  General exam: Appears calm and comfortable  Respiratory system: Mild wheezing. Respiratory effort normal. Cardiovascular system: S1 & S2 heard, RRR. No JVD, murmurs, rubs, gallops or clicks. No pedal edema. Gastrointestinal system: Abdomen is nondistended, soft and nontender. No organomegaly or masses felt. Normal bowel sounds heard. Central nervous system: Alert and oriented. No focal neurological deficits. Extremities: Symmetric 5 x 5 power. Skin: No rashes, lesions or ulcers Psychiatry: Judgement and insight appear normal. Mood & affect appropriate.     Data Reviewed: I have personally reviewed following labs and imaging studies  Micro Results  No results found for this or any previous visit (from the past 240 hour(s)).  Radiology Reports Dg Chest 2 View  10/24/2015  CLINICAL DATA:  Shortness of breath, asthma, COPD EXAM: CHEST  2 VIEW COMPARISON:  10/24/2015 FINDINGS: Cardiomediastinal silhouette is stable. Mild interstitial prominence bilaterally again noted. No infiltrate or pulmonary edema. Mild thoracic spine osteopenia. IMPRESSION: No active disease. Stable chronic mild  interstitial prominence. No superimposed infiltrate. Electronically Signed   By: Lahoma Crocker M.D.   On: 10/24/2015 18:16   Dg Chest 2 View  10/24/2015  CLINICAL DATA:  Fall several days ago with left-sided chest pain, initial encounter EXAM: CHEST  2 VIEW COMPARISON:  09/24/2015 FINDINGS: Cardiac shadow is within normal limits. The lungs are well aerated without focal infiltrate or sizable effusion. Mild chronic interstitial changes are seen. No pneumothorax is noted. No acute bony abnormality seen. IMPRESSION: No acute abnormality noted. Electronically Signed   By: Inez Catalina M.D.   On: 10/24/2015 16:03     CBC  Recent Labs Lab 10/20/15 0947 10/24/15 1630 10/25/15 0459  WBC 11.2* 11.5* 6.6  HGB  --  10.4* 10.2*  HCT 37.3* 33.0* 31.1*  PLT  --  411* 405*  MCV 97 95.1 93.7  MCH 30.5 30.0 30.7  MCHC 31.4* 31.5 32.8  RDW 14.9 15.0 14.8  LYMPHSABS 2.7 4.0  --   MONOABS  --  1.3*  --   EOSABS 0.6* 0.4  --   BASOSABS 0.0 0.0  --     Chemistries   Recent Labs Lab 10/20/15 0947 10/24/15 1630 10/25/15 0459  NA 142 135 136  K 4.5 3.9 4.2  CL 94* 96* 96*  CO2 32* 35* 30  GLUCOSE 112* 122* 143*  BUN 7* 14 14  CREATININE 0.80 0.82 0.68  CALCIUM 9.8 9.1 9.2  MG  --  1.9  --   AST 13 21 17   ALT 18 18 15*  ALKPHOS 87 79 69  BILITOT 0.2 0.5 0.4   ------------------------------------------------------------------------------------------------------------------ estimated creatinine clearance is 71.5 mL/min (by C-G formula based on Cr of 0.68). ------------------------------------------------------------------------------------------------------------------ No results for input(s): HGBA1C in the last 72 hours. ------------------------------------------------------------------------------------------------------------------ No results for input(s): CHOL, HDL, LDLCALC, TRIG, CHOLHDL, LDLDIRECT in the last 72  hours. ------------------------------------------------------------------------------------------------------------------  Recent Labs  10/24/15 1630  TSH 0.589   ------------------------------------------------------------------------------------------------------------------ No results for input(s): VITAMINB12, FOLATE, FERRITIN, TIBC, IRON, RETICCTPCT in the last 72 hours.  Coagulation profile No results for input(s): INR, PROTIME in the last 168 hours.  No results for input(s): DDIMER in the last 72 hours.  Cardiac Enzymes No results for input(s): CKMB, TROPONINI, MYOGLOBIN in the last 168 hours.  Invalid input(s): CK ------------------------------------------------------------------------------------------------------------------ Invalid input(s): POCBNP   CBG: No results for input(s): GLUCAP in the last 168 hours.     Studies: Dg Chest 2 View  10/24/2015  CLINICAL DATA:  Shortness of breath, asthma, COPD EXAM: CHEST  2 VIEW COMPARISON:  10/24/2015 FINDINGS: Cardiomediastinal silhouette is stable. Mild interstitial prominence bilaterally again noted. No infiltrate or pulmonary edema. Mild thoracic spine osteopenia. IMPRESSION: No active disease. Stable chronic mild interstitial prominence. No superimposed infiltrate. Electronically Signed   By: Lahoma Crocker M.D.   On: 10/24/2015 18:16   Dg Chest 2 View  10/24/2015  CLINICAL DATA:  Fall several days ago with left-sided chest pain, initial encounter EXAM: CHEST  2 VIEW COMPARISON:  09/24/2015 FINDINGS: Cardiac shadow is within normal limits. The lungs are well aerated without focal infiltrate or sizable effusion. Mild chronic  interstitial changes are seen. No pneumothorax is noted. No acute bony abnormality seen. IMPRESSION: No acute abnormality noted. Electronically Signed   By: Inez Catalina M.D.   On: 10/24/2015 16:03      Lab Results  Component Value Date   HGBA1C 5.7* 05/05/2014   HGBA1C 5.7* 03/26/2013   HGBA1C 6.2*  03/12/2012   Lab Results  Component Value Date   CREATININE 0.68 10/25/2015       Scheduled Meds: . ALPRAZolam  0.5 mg Oral TID  . aspirin EC  81 mg Oral q morning - 10a  . budesonide (PULMICORT) nebulizer solution  0.25 mg Nebulization BID  . citalopram  20 mg Oral q morning - 10a  . enoxaparin (LOVENOX) injection  40 mg Subcutaneous Q24H  . ipratropium-albuterol  3 mL Nebulization Q6H  . levofloxacin (LEVAQUIN) IV  750 mg Intravenous Q24H  . loratadine  10 mg Oral q morning - 10a  . methylPREDNISolone (SOLU-MEDROL) injection  40 mg Intravenous Q12H  . montelukast  10 mg Oral QHS  . multivitamin with minerals  1 tablet Oral Daily  . pantoprazole  80 mg Oral Daily  . simvastatin  40 mg Oral QHS  . cyanocobalamin  500 mcg Oral q morning - 10a   Continuous Infusions:    LOS: 1 day    Time spent: >30 MINS    [facility]  [facility] Pager [phone]. If 7PM-7AM, please contact night-coverage at [email or web address], password [facility] 10/25/2015, 8:46 AM  LOS: 1 day

## 2015-10-25 NOTE — Care Management Note (Signed)
Case Management Note  Patient Details  Name: [name] [name] MRN: [id number] Date of Birth: [birth year]  Subjective/Objective:  Patient from home alone, but has "friends" that stay with him at times. Pateint reports having a walker. He has been discharged from multiple home health agencies previously. Spoke with [facility] representative that states family has a lots of issues regarding the friends that stay with him, i.e. Police officers getting called to home. Patient has a PCP and someone drives him to appointments. Patient has been active with [facility] also. Patient has home oxygen. Reports no issues obtaining medications.                   Action/Plan: Anticipate d/c home with [facility] services if available. Will follow.    Expected Discharge Date:  10/27/15               Expected Discharge Plan:  [place]  In-House Referral:     Discharge planning Services  CM Consult  Post Acute Care Choice:    Choice offered to:     DME Arranged:    DME Agency:     HH Arranged:    [place] Agency:     Status of Service:  In process, will continue to follow  If discussed at Long Length of Stay Meetings, dates discussed:    Additional Comments:  Seve Monette, Chauncey Reading, RN 10/25/2015, 12:53 PM

## 2015-10-25 NOTE — Care Management Important Message (Signed)
Important Message  Patient Details  Name: BERNARDO JODOIN MRN: [id number] Date of Birth: [birth year]   Medicare Important Message Given:  Yes    Hersey Maclellan, Chauncey Reading, RN 10/25/2015, 12:52 PM

## 2015-10-25 NOTE — Progress Notes (Signed)
Patient c/o not being able to sleep, paged on call MD, will follow orders given and continue to monitor the patient.

## 2015-10-26 ENCOUNTER — Other Ambulatory Visit: Payer: Self-pay | Admitting: Licensed Clinical Social Worker

## 2015-10-26 DIAGNOSIS — J441 Chronic obstructive pulmonary disease with (acute) exacerbation: Principal | ICD-10-CM

## 2015-10-26 DIAGNOSIS — J9621 Acute and chronic respiratory failure with hypoxia: Secondary | ICD-10-CM

## 2015-10-26 DIAGNOSIS — R296 Repeated falls: Secondary | ICD-10-CM

## 2015-10-26 DIAGNOSIS — I10 Essential (primary) hypertension: Secondary | ICD-10-CM

## 2015-10-26 LAB — COMPREHENSIVE METABOLIC PANEL
ALT: 16 U/L — ABNORMAL LOW (ref 17–63)
AST: 17 U/L (ref 15–41)
Albumin: 3.2 g/dL — ABNORMAL LOW (ref 3.5–5.0)
Alkaline Phosphatase: 63 U/L (ref 38–126)
Anion gap: 8 (ref 5–15)
BUN: 17 mg/dL (ref 6–20)
CHLORIDE: 96 mmol/L — AB (ref 101–111)
CO2: 32 mmol/L (ref 22–32)
Calcium: 9.3 mg/dL (ref 8.9–10.3)
Creatinine, Ser: 0.64 mg/dL (ref 0.61–1.24)
Glucose, Bld: 152 mg/dL — ABNORMAL HIGH (ref 65–99)
POTASSIUM: 4 mmol/L (ref 3.5–5.1)
Sodium: 136 mmol/L (ref 135–145)
Total Bilirubin: 0.4 mg/dL (ref 0.3–1.2)
Total Protein: 6.4 g/dL — ABNORMAL LOW (ref 6.5–8.1)

## 2015-10-26 LAB — CBC
HCT: 30.5 % — ABNORMAL LOW (ref 39.0–52.0)
Hemoglobin: 10.1 g/dL — ABNORMAL LOW (ref 13.0–17.0)
MCH: 30.6 pg (ref 26.0–34.0)
MCHC: 33.1 g/dL (ref 30.0–36.0)
MCV: 92.4 fL (ref 78.0–100.0)
PLATELETS: 435 10*3/uL — AB (ref 150–400)
RBC: 3.3 MIL/uL — ABNORMAL LOW (ref 4.22–5.81)
RDW: 14.8 % (ref 11.5–15.5)
WBC: 11.5 10*3/uL — AB (ref 4.0–10.5)

## 2015-10-26 MED ORDER — GUAIFENESIN ER 600 MG PO TB12
1200.0000 mg | ORAL_TABLET | Freq: Two times a day (BID) | ORAL | Status: DC
  Administered 2015-10-26 – 2015-10-27 (×3): 1200 mg via ORAL
  Filled 2015-10-26 (×3): qty 2

## 2015-10-26 NOTE — Evaluation (Signed)
Physical Therapy Evaluation Patient Details Name: [name] [name] MRN: [id number] DOB: January 02, 1941 Today's Date: 10/26/2015   History of Present Illness  75 yo M admitted 10/24/2015 with SOB. Pt reports ~10 falls within the last 6 months.  He states that he doesn't know what happens when he falls, his legs just give out.  Dx: acute on chronic respiratory failure with hypoxia due to COPD exacerbation. VQ scan impression: low probability for PE. [place] of B LE's: (-) DVT.  PMH: COPD on home O2 at 4L/min, tobacco abuse, asthma, GERD, BPH, bladder tumor s/p transurethral resection, pulmonary nodule, chronic back pain, chronic anxiety, emphysema, depression, Pin in R ring finger s/p fx, cholecystectomy.   Clinical Impression  Pt received in bed, and was agreeable to PT evaluation.  Pt states that he normally ambulates in his home with a Rollator or a cane, and has been modified independent with performing dressing and bathing tasks.  He lives with a roommate.  Pt has had multiple admissions in the last 6 months for the same issue.  Pt also continues to have recurrent falls and noted to have a bandage around his R upper arm today due to injury when he fell.  At this point, pt is recommended for SNF due to continued issues managing health and O2 saturation at home, and recurrent falls.     Follow Up Recommendations SNF;Supervision/Assistance - 24 hour    Equipment Recommendations  None recommended by PT    Recommendations for Other Services OT consult     Precautions / Restrictions Precautions Precautions: Fall Precaution Comments: Pt has had ~10 falls in the last 6 months.  Restrictions Weight Bearing Restrictions: No      Mobility  Bed Mobility Overal bed mobility: Modified Independent             General bed mobility comments: bed flat, increased time.   Transfers Overall transfer level: Needs assistance Equipment used: Rolling walker (2 wheeled) Transfers: Sit to/from Stand Sit to  Stand: Min guard         General transfer comment: Vc's for hand placement and to push from the bed instead of pulling on the RW.   Ambulation/Gait Ambulation/Gait assistance: Min guard Ambulation Distance (Feet): 80 Feet Assistive device: Rolling walker (2 wheeled) Gait Pattern/deviations: Step-through pattern     General Gait Details: 1 standing rest break - unable to get pulse oximeter to read during ambulation.  Upon returning to the room, SpO2 found to be 78% on 4L, but improved to 90% with seated rest break after 1-2 minutes.  RT present to administer a breating treatement directly afterwards.    Stairs            Wheelchair Mobility    Modified Rankin (Stroke Patients Only)       Balance Overall balance assessment: Needs assistance Sitting-balance support: Bilateral upper extremity supported Sitting balance-Leahy Scale: Good     Standing balance support: Bilateral upper extremity supported Standing balance-Leahy Scale: Fair                               Pertinent Vitals/Pain Pain Assessment: No/denies pain    Home Living   Living Arrangements:  (another guy - "you can call him a roommate" He is sometimes there when I need him, sometimes I have to do for my own. ) Available Help at Discharge:  (dtr - helps with "anything I ask her to." Cleaning, grocery shopping,  and finances. ) Type of Home: Mobile home Home Access: Ramped entrance     Home Layout: One level Home Equipment: [place] - 2 wheels;Cane - single point;Shower seat;Bedside commode;Walker - 4 wheels      Prior Function     Gait / Transfers Assistance Needed: Pt states he uses his cane or his rollator for ambulation at home.   ADL's / Homemaking Assistance Needed: independent with dressing - takes a while, independent with bathing        Hand Dominance   Dominant Hand: Right    Extremity/Trunk Assessment   Upper Extremity Assessment: Generalized weakness            Lower Extremity Assessment: Generalized weakness         Communication   Communication: HOH  Cognition Arousal/Alertness: Awake/alert Behavior During Therapy: WFL for tasks assessed/performed Overall Cognitive Status: Impaired/Different from baseline Area of Impairment: Orientation Orientation Level: Disoriented to;Time (unable to tell the date. )                  General Comments      Exercises        Assessment/Plan    PT Assessment Patient needs continued PT services  PT Diagnosis Difficulty walking;Generalized weakness   PT Problem List Decreased strength;Decreased activity tolerance;Decreased balance;Decreased mobility;Decreased knowledge of use of DME;Decreased safety awareness;Decreased knowledge of precautions;Cardiopulmonary status limiting activity  PT Treatment Interventions DME instruction;Gait training;Functional mobility training;Therapeutic activities;Therapeutic exercise;Balance training;Patient/family education   PT Goals (Current goals can be found in the Care Plan section) Acute Rehab PT Goals Patient Stated Goal: Pt expressed that he wants to go home.  PT Goal Formulation: With patient Time For Goal Achievement: 11/02/15 Potential to Achieve Goals: Fair    Frequency Min 4X/week   Barriers to discharge Decreased caregiver support      Co-evaluation               End of Session Equipment Utilized During Treatment: Gait belt;Oxygen Activity Tolerance: Patient limited by fatigue Patient left: in chair;Other (comment) (RT present) Nurse Communication: Mobility status    Functional Assessment Tool Used: [employer] "6-clicks"  Functional Limitation: Mobility: Walking and moving around Mobility: Walking and Moving Around Current Status [phone]): At least 20 percent but less than 40 percent impaired, limited or restricted Mobility: Walking and Moving Around Goal Status [phone]): At least 1 percent but less than 20 percent  impaired, limited or restricted    Time: 0851-0921 PT Time Calculation (min) (ACUTE ONLY): 30 min   Charges:   PT Evaluation $PT Eval Low Complexity: 1 Procedure PT Treatments $Gait Training: 8-22 mins   PT G Codes:   PT G-Codes **NOT FOR INPATIENT CLASS** Functional Assessment Tool Used: [employer] "6-clicks"  Functional Limitation: Mobility: Walking and moving around Mobility: Walking and Moving Around Current Status [phone]): At least 20 percent but less than 40 percent impaired, limited or restricted Mobility: Walking and Moving Around Goal Status [phone]): At least 1 percent but less than 20 percent impaired, limited or restricted    Beth Rye Dorado, PT, DPT X: [phone]

## 2015-10-26 NOTE — Consult Note (Signed)
Consult requested by: [facility] hospitalist Consult requested for respiratory failure:  HPI: This is a 75 year old who has a long known history of COPD. He says he got sick about a week prior to admission with cough and congestion. He continued to worsen went to his primary care physician's office and was sent from there to the emergency department. He was noted to be hypoxic and short of breath in the emergency department. Chest x-ray does not show pneumonia. He had ventilation perfusion lung scan that was low probability for pulmonary embolus. He says he is coughing and coughing up some sputum. He has been falling at home and has some injury to his right rib cage and he says that keeps him from taking a deep breath. He's not had any hemoptysis fever chills at home. He says he feels a little better since she's been in the hospital  Past Medical History  Diagnosis Date  . COPD (chronic obstructive pulmonary disease) ([place])   . Asthma   . Tobacco abuse 09/29/2011  . Hypercholesteremia   . GERD (gastroesophageal reflux disease)   . Headache(784.0)   . BPH (benign prostatic hyperplasia) 10/01/2011    Per cystoscopy  . 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  . Pulmonary nodule 09/2011    Stable appearance  . Chronic back pain   . Chronic anxiety   . Hyperlipidemia 03/13/2012  . Anxiety 03/13/2012  . On home O2     2L N/C continuous  . Emphysema lung ([place])   . Depression   . History of stomach ulcers      Family History  Problem Relation Age of Onset  . Emphysema Brother     was not close to family; does not truly know their medical problems.  . Emphysema Brother   . Emphysema Brother   . Cancer Neg Hx   . Kidney failure Mother      Social History   Social History  . Marital Status: Divorced    Spouse Name: N/A  . Number of Children: N/A  . Years of Education: N/A   Social History Main Topics  . Smoking status: Current Every Day Smoker -- 0.50 packs/day for  58 years    Types: Cigarettes  . Smokeless tobacco: None  . Alcohol Use: No  . Drug Use: No  . Sexual Activity: No   Other Topics Concern  . None   Social History Narrative     ROS: No hemoptysis no fever no chills. No abdominal pain nausea vomiting diarrhea. No urinary symptoms. No HEENT symptoms. He is having chest pain but it's related to his rib injury. Per the history and physical which I have reviewed    Objective: Vital signs in last 24 hours: Temp:  [98 F (36.7 C)-98.4 F (36.9 C)] 98.4 F (36.9 C) (07/13 0635) Pulse Rate:  [66-104] 66 (07/13 0635) Resp:  [14-20] 14 (07/13 0635) BP: (108-135)/(52-69) 108/52 mmHg (07/13 0635) SpO2:  [89 %-96 %] 92 % (07/13 0635) Weight change:  Last BM Date: 10/25/15  Intake/Output from previous day: 07/12 0701 - 07/13 0700 In: 720 [P.O.:720] Out: 1400 [Urine:1400]  PHYSICAL EXAM He is awake and alert. He is coughing during the examination. His chest shows rhonchi and wheezes bilaterally. His heart is regular without gallop. His abdomen is soft no masses are felt extremities showed no edema. Central nervous system exam is grossly intact. HEENT exam shows his mucous membranes are moist. Neck is supple.  Lab Results: Basic Metabolic  Panel:  Recent Labs  10/24/15 1630 10/25/15 0459 10/26/15 0407  NA 135 136 136  K 3.9 4.2 4.0  CL 96* 96* 96*  CO2 35* 30 32  GLUCOSE 122* 143* 152*  BUN 14 14 17   CREATININE 0.82 0.68 0.64  CALCIUM 9.1 9.2 9.3  MG 1.9  --   --   PHOS 3.5  --   --    Liver Function Tests:  Recent Labs  10/25/15 0459 10/26/15 0407  AST 17 17  ALT 15* 16*  ALKPHOS 69 63  BILITOT 0.4 0.4  PROT 6.5 6.4*  ALBUMIN 3.4* 3.2*   No results for input(s): LIPASE, AMYLASE in the last 72 hours. No results for input(s): AMMONIA in the last 72 hours. CBC:  Recent Labs  10/24/15 1630 10/25/15 0459 10/26/15 0407  WBC 11.5* 6.6 11.5*  NEUTROABS 5.8  --   --   HGB 10.4* 10.2* 10.1*  HCT 33.0* 31.1*  30.5*  MCV 95.1 93.7 92.4  PLT 411* 405* 435*   Cardiac Enzymes: No results for input(s): CKTOTAL, CKMB, CKMBINDEX, TROPONINI in the last 72 hours. BNP: No results for input(s): PROBNP in the last 72 hours. D-Dimer: No results for input(s): DDIMER in the last 72 hours. CBG: No results for input(s): GLUCAP in the last 72 hours. Hemoglobin A1C: No results for input(s): HGBA1C in the last 72 hours. Fasting Lipid Panel: No results for input(s): CHOL, HDL, LDLCALC, TRIG, CHOLHDL, LDLDIRECT in the last 72 hours. Thyroid Function Tests:  Recent Labs  10/24/15 1630  TSH 0.589   Anemia Panel: No results for input(s): VITAMINB12, FOLATE, FERRITIN, TIBC, IRON, RETICCTPCT in the last 72 hours. Coagulation: No results for input(s): LABPROT, INR in the last 72 hours. Urine Drug Screen: Drugs of Abuse  No results found for: LABOPIA, COCAINSCRNUR, LABBENZ, AMPHETMU, THCU, LABBARB  Alcohol Level: No results for input(s): ETH in the last 72 hours. Urinalysis: No results for input(s): COLORURINE, LABSPEC, PHURINE, GLUCOSEU, HGBUR, BILIRUBINUR, KETONESUR, PROTEINUR, UROBILINOGEN, NITRITE, LEUKOCYTESUR in the last 72 hours.  Invalid input(s): APPERANCEUR Misc. Labs:   ABGS:  Recent Labs  10/25/15 1120  PHART 7.480*  PO2ART 86.9  TCO2 39.0  HCO3 29.1*     MICROBIOLOGY: Recent Results (from the past 240 hour(s))  MRSA PCR Screening     Status: None   Collection Time: 10/25/15  2:13 AM  Result Value Ref Range Status   MRSA by PCR NEGATIVE NEGATIVE Final    Comment:        The GeneXpert MRSA Assay (FDA approved for NASAL specimens only), is one component of a comprehensive MRSA colonization surveillance program. It is not intended to diagnose MRSA infection nor to guide or monitor treatment for MRSA infections.     Studies/Results: Dg Chest 2 View  10/24/2015  CLINICAL DATA:  Shortness of breath, asthma, COPD EXAM: CHEST  2 VIEW COMPARISON:  10/24/2015 FINDINGS:  Cardiomediastinal silhouette is stable. Mild interstitial prominence bilaterally again noted. No infiltrate or pulmonary edema. Mild thoracic spine osteopenia. IMPRESSION: No active disease. Stable chronic mild interstitial prominence. No superimposed infiltrate. Electronically Signed   By: Lahoma Crocker M.D.   On: 10/24/2015 18:16   Dg Chest 2 View  10/24/2015  CLINICAL DATA:  Fall several days ago with left-sided chest pain, initial encounter EXAM: CHEST  2 VIEW COMPARISON:  09/24/2015 FINDINGS: Cardiac shadow is within normal limits. The lungs are well aerated without focal infiltrate or sizable effusion. Mild chronic interstitial changes are seen. No pneumothorax is  noted. No acute bony abnormality seen. IMPRESSION: No acute abnormality noted. Electronically Signed   By: Inez Catalina M.D.   On: 10/24/2015 16:03   Nm Pulmonary Perf And Vent  10/25/2015  CLINICAL DATA:  COPD.  Short of breath with hypoxemia EXAM: NUCLEAR MEDICINE VENTILATION - PERFUSION LUNG SCAN TECHNIQUE: Ventilation images were obtained in multiple projections using inhaled aerosol Tc-45m DTPA. Perfusion images were obtained in multiple projections after intravenous injection of Tc-105m MAA. RADIOPHARMACEUTICALS:  33 mCi Technetium-7m DTPA aerosol inhalation and 4.7 mCi Technetium-64m MAA IV COMPARISON:  Chest x-ray 10/24/2015 FINDINGS: Ventilation: Patchy subsegmental perfusion defects in the upper lobes bilaterally likely due to COPD. Note is made of emphysema in the upper lobes on prior scans. Perfusion: Patchy ventilation due to aggregated macro albumin. Suboptimal profusion study. IMPRESSION: Low probability for pulmonary embolism. Changes of COPD in the apices. Electronically Signed   By: Franchot Gallo M.D.   On: 10/25/2015 11:22   US Venous Img Lower Bilateral  10/25/2015  CLINICAL DATA:  ACUTE SHORTNESS OF BREATH FOR 1 DAY, COPD, SMOKER EXAM: BILATERAL LOWER EXTREMITY VENOUS DOPPLER ULTRASOUND TECHNIQUE: Gray-scale sonography  with graded compression, as well as color Doppler and duplex ultrasound were performed to evaluate the lower extremity deep venous systems from the level of the common femoral vein and including the common femoral, femoral, profunda femoral, popliteal and calf veins including the posterior tibial, peroneal and gastrocnemius veins when visible. The superficial great saphenous vein was also interrogated. Spectral Doppler was utilized to evaluate flow at rest and with distal augmentation maneuvers in the common femoral, femoral and popliteal veins. COMPARISON:  None. FINDINGS: RIGHT LOWER EXTREMITY Common Femoral Vein: No evidence of thrombus. Normal compressibility, respiratory phasicity and response to augmentation. Saphenofemoral Junction: No evidence of thrombus. Normal compressibility and flow on color Doppler imaging. Profunda Femoral Vein: No evidence of thrombus. Normal compressibility and flow on color Doppler imaging. Femoral Vein: No evidence of thrombus. Normal compressibility, respiratory phasicity and response to augmentation. Popliteal Vein: No evidence of thrombus. Normal compressibility, respiratory phasicity and response to augmentation. Calf Veins: No evidence of thrombus. Normal compressibility and flow on color Doppler imaging. Superficial Great Saphenous Vein: No evidence of thrombus. Normal compressibility and flow on color Doppler imaging. Venous Reflux:  None. Other Findings:  None. LEFT LOWER EXTREMITY Common Femoral Vein: No evidence of thrombus. Normal compressibility, respiratory phasicity and response to augmentation. Saphenofemoral Junction: No evidence of thrombus. Normal compressibility and flow on color Doppler imaging. Profunda Femoral Vein: No evidence of thrombus. Normal compressibility and flow on color Doppler imaging. Femoral Vein: No evidence of thrombus. Normal compressibility, respiratory phasicity and response to augmentation. Popliteal Vein: No evidence of thrombus. Normal  compressibility, respiratory phasicity and response to augmentation. Calf Veins: No evidence of thrombus. Normal compressibility and flow on color Doppler imaging. Superficial Great Saphenous Vein: No evidence of thrombus. Normal compressibility and flow on color Doppler imaging. Venous Reflux:  None. Other Findings:  None. IMPRESSION: No evidence of deep venous thrombosis. Electronically Signed   By: Jerilynn Mages.  Shick M.D.   On: 10/25/2015 09:45    Medications:  Prior to Admission:  Prescriptions prior to admission  Medication Sig Dispense Refill Last Dose  . albuterol (PROVENTIL HFA;VENTOLIN HFA) 108 (90 Base) MCG/ACT inhaler Inhale 1-2 puffs into the lungs every 6 (six) hours as needed for wheezing or shortness of breath.   10/24/2015 at Unknown time  . albuterol (PROVENTIL) (2.5 MG/3ML) 0.083% nebulizer solution Take 2.5 mg by nebulization every 4 (four)  hours as needed. For shortness of breath   10/24/2015 at Unknown time  . ALPRAZolam (XANAX) 1 MG tablet Take 1 mg by mouth 4 (four) times daily. anxiety   10/24/2015 at Unknown time  . aspirin EC 81 MG tablet Take 81 mg by mouth every morning.    10/24/2015 at Unknown time  . budesonide-formoterol (SYMBICORT) 160-4.5 MCG/ACT inhaler Inhale 2 puffs into the lungs 2 (two) times daily. 1 Inhaler 12 10/24/2015 at Unknown time  . citalopram (CELEXA) 20 MG tablet Take 1 tablet by mouth every morning.    10/24/2015 at Unknown time  . COMBIVENT RESPIMAT 20-100 MCG/ACT AERS respimat Inhale 1 spray into the lungs 4 (four) times daily.   10/23/2015 at Unknown time  . cyanocobalamin 500 MCG tablet Take 500 mcg by mouth every morning.   10/24/2015 at Unknown time  . DALIRESP 500 MCG TABS tablet Take 500 mcg by mouth every morning.    10/24/2015 at Unknown time  . docusate sodium (COLACE) 100 MG capsule Take 100 mg by mouth daily as needed for mild constipation or moderate constipation.    unknown  . DULoxetine (CYMBALTA) 60 MG capsule Take 1 capsule (60 mg total) by mouth  daily. (Patient taking differently: Take 1 capsule (60 mg total) by mouth daily in the morning) 30 capsule 0 10/24/2015 at Unknown time  . esomeprazole (NEXIUM) 40 MG capsule Take 40 mg by mouth every morning.   10/24/2015 at Unknown time  . GuaiFENesin (MUCINEX MAXIMUM STRENGTH PO) Take 1 tablet by mouth daily as needed (for congestion).   Past Week at Unknown time  . loratadine (CLARITIN) 10 MG tablet Take 10 mg by mouth every morning.    10/24/2015 at Unknown time  . Multiple Vitamin (MULTIVITAMIN WITH MINERALS) TABS tablet Take 1 tablet by mouth daily.   10/24/2015 at Unknown time  . NITROSTAT 0.4 MG SL tablet Place 1 tablet under the tongue every 5 (five) minutes as needed for chest pain.    unknown  . Omega-3 Fatty Acids (FISH OIL) 1000 MG CAPS Take 3,000 mg by mouth every morning.   10/24/2015 at Unknown time  . simvastatin (ZOCOR) 40 MG tablet Take 40 mg by mouth at bedtime.    10/23/2015 at Unknown time   Scheduled: . ALPRAZolam  0.5 mg Oral TID  . aspirin EC  81 mg Oral q morning - 10a  . budesonide (PULMICORT) nebulizer solution  0.25 mg Nebulization BID  . citalopram  20 mg Oral q morning - 10a  . enoxaparin (LOVENOX) injection  40 mg Subcutaneous Q24H  . ipratropium-albuterol  3 mL Nebulization Q6H  . levofloxacin (LEVAQUIN) IV  750 mg Intravenous Q24H  . loratadine  10 mg Oral q morning - 10a  . methylPREDNISolone (SOLU-MEDROL) injection  80 mg Intravenous Q6H  . montelukast  10 mg Oral QHS  . multivitamin with minerals  1 tablet Oral Daily  . pantoprazole  80 mg Oral Daily  . simvastatin  40 mg Oral QHS  . cyanocobalamin  500 mcg Oral q morning - 10a   Continuous:  [id number], docusate sodium, ipratropium-albuterol, nitroGLYCERIN  Assesment: He has acute on chronic respiratory failure with COPD exacerbation. He says he is better but he is still having significant cough and shortness of breath. He is on appropriate treatment. Active Problems:   Acute on chronic  respiratory failure (HCC)   Shortness of breath    Plan: Add flutter valve and incentive spirometry. Continue IV steroids antibiotics inhale bronchodilators etc.  Thanks for allowing me to see him with you    LOS: 2 days   Dillian Feig L 10/26/2015, 9:03 AM

## 2015-10-26 NOTE — Consult Note (Signed)
   [facility] Inpatient Consult   10/26/2015  [name] [name] [birth year] [id number]   Patient is currently active with [facility] for chronic disease management services.  Patient has been engaged by a [employer] and LCSW.  Our community based plan of care has focused on disease management and community resource support.  Patient has been recommended by PT to go to Skilled nursing upon discharge for short term rehab before returning home. [facility] program LCSW will be notified of plan for follow up.  Made Inpatient Case Manager aware that [facility] following.  Of note, [facility] Care Management services does not replace or interfere with any services that are arranged by inpatient case management or social work.   For additional questions or referrals please contact:   [name]. [name], RN, BSN, [place] [phone]) Business Cell  [phone]) Toll Free Office

## 2015-10-26 NOTE — NC FL2 (Signed)
[place] MEDICAID FL2 LEVEL OF CARE SCREENING TOOL     IDENTIFICATION  Patient Name: [name] [name] Birthdate: 03-Mar-1941 Sex: male Admission Date (Current Location): 10/24/2015  [facility] and [place] Number:  [employer] and Address:  [place] [address] [place]      Provider Number: [id number]  Attending Physician Name and Address:  Kathie Dike, MD  Relative Name and Phone Number:       Current Level of Care: Hospital Recommended Level of Care: [place] Prior Approval Number:    Date Approved/Denied:   PASRR Number: [id number]  Discharge Plan: SNF    Current Diagnoses: Patient Active Problem List   Diagnosis Date Noted  . Shortness of breath   . Acute on chronic respiratory failure ([place]) 10/24/2015  . Pressure ulcer 09/25/2015  . Respiratory failure ([place]) 09/24/2015  . Acute on chronic respiratory failure with hypercapnia ([place])   . Frequent falls 09/21/2015  . HLD (hyperlipidemia) 08/18/2015  . Chronic anemia 08/18/2015  . COPD exacerbation ([place]) 08/18/2015  . Chronic pain syndrome 05/01/2015  . Chronic respiratory failure with hypoxia ([place])   . COPD (chronic obstructive pulmonary disease) ([place]) 02/09/2014  . Hyperglycemia, drug-induced 03/26/2013  . Chronic back pain   . Anxiety 03/13/2012  . Hyperlipidemia 03/13/2012  . BPH (benign prostatic hyperplasia) 10/01/2011  . Tobacco abuse 09/29/2011  . Bladder tumor 09/28/2011  . PULMONARY NODULE 12/27/2009  . Essential hypertension 12/26/2009  . OSTEOARTHRITIS 12/26/2009    Orientation RESPIRATION BLADDER Height & Weight     Self, Time, Situation, Place  O2 (4 L) Continent Weight: 159 lb 6.3 oz (72.3 kg) Height:  5\' 5"  (165.1 cm)  BEHAVIORAL SYMPTOMS/MOOD NEUROLOGICAL BOWEL NUTRITION STATUS  Other (Comment) (n/a)  (n/a) Continent Diet (Heart healthy)  AMBULATORY STATUS COMMUNICATION OF NEEDS Skin   Limited Assist Verbally Bruising, Other  (Comment) (Abrasion left leg and knee. Skin tear bilateral arms.)                       Personal Care Assistance Level of Assistance  Bathing, Feeding, Dressing Bathing Assistance: Limited assistance Feeding assistance: Limited assistance Dressing Assistance: Limited assistance     Functional Limitations Info  Sight, Hearing, Speech Sight Info: Impaired Hearing Info: Adequate Speech Info: Adequate    SPECIAL CARE FACTORS FREQUENCY  PT (By licensed PT)     PT Frequency: 5              Contractures Contractures Info: Not present    Additional Factors Info  Psychotropic Code Status Info: Full code Allergies Info: Iohexol, Ivp Dye Psychotropic Info: Xanax         Current Medications (10/26/2015):  This is the current hospital active medication list Current Facility-Administered Medications  Medication Dose Route Frequency Provider Last Rate Last Dose  . acetaminophen (TYLENOL) tablet 650 mg  650 mg Oral Q6H PRN Reyne Dumas, MD   650 mg at 10/25/15 1512  . ALPRAZolam Duanne Moron) tablet 0.5 mg  0.5 mg Oral TID Bonnell Public, MD   0.5 mg at 10/26/15 1120  . aspirin EC tablet 81 mg  81 mg Oral q morning - 10a Bonnell Public, MD   81 mg at 10/26/15 1119  . budesonide (PULMICORT) nebulizer solution 0.25 mg  0.25 mg Nebulization BID Bonnell Public, MD   0.25 mg at 10/26/15 [id number]  . citalopram (CELEXA) tablet 20 mg  20 mg Oral q morning - 10a  Bonnell Public, MD   20 mg at 10/26/15 1120  . docusate sodium (COLACE) capsule 100 mg  100 mg Oral Daily PRN Bonnell Public, MD      . enoxaparin (LOVENOX) injection 40 mg  40 mg Subcutaneous Q24H Bonnell Public, MD   40 mg at 10/25/15 2158  . guaiFENesin (MUCINEX) 12 hr tablet 1,200 mg  1,200 mg Oral BID Kathie Dike, MD   1,200 mg at 10/26/15 1330  . ipratropium-albuterol (DUONEB) 0.5-2.5 (3) MG/3ML nebulizer solution 3 mL  3 mL Nebulization Q4H PRN Bonnell Public, MD      . ipratropium-albuterol (DUONEB)  0.5-2.5 (3) MG/3ML nebulizer solution 3 mL  3 mL Nebulization Q6H Bonnell Public, MD   3 mL at 10/26/15 1425  . levofloxacin (LEVAQUIN) IVPB 750 mg  750 mg Intravenous Q24H Bonnell Public, MD   750 mg at 10/25/15 2158  . loratadine (CLARITIN) tablet 10 mg  10 mg Oral q morning - 10a Bonnell Public, MD   10 mg at 10/26/15 1120  . methylPREDNISolone sodium succinate (SOLU-MEDROL) 125 mg/2 mL injection 80 mg  80 mg Intravenous Q6H Reyne Dumas, MD   80 mg at 10/26/15 1121  . montelukast (SINGULAIR) tablet 10 mg  10 mg Oral QHS Bonnell Public, MD   10 mg at 10/25/15 2158  . multivitamin with minerals tablet 1 tablet  1 tablet Oral Daily Bonnell Public, MD   1 tablet at 10/26/15 1119  . nitroGLYCERIN (NITROSTAT) SL tablet 0.4 mg  0.4 mg Sublingual Q5 min PRN Bonnell Public, MD      . pantoprazole (PROTONIX) EC tablet 80 mg  80 mg Oral Daily Bonnell Public, MD   80 mg at 10/26/15 1118  . simvastatin (ZOCOR) tablet 40 mg  40 mg Oral QHS Bonnell Public, MD   40 mg at 10/25/15 2158  . vitamin B-12 (CYANOCOBALAMIN) tablet 500 mcg  500 mcg Oral q morning - 10a Bonnell Public, MD   500 mcg at 10/26/15 1120     Discharge Medications: Please see discharge summary for a list of discharge medications.  Relevant Imaging Results:  Relevant Lab Results:   Additional Information SSN: [id number]  Salome Arnt, [place]

## 2015-10-26 NOTE — Clinical Social Work Placement (Signed)
   CLINICAL SOCIAL WORK PLACEMENT  NOTE  Date:  10/26/2015  Patient Details  Name: [name] [name] MRN: [id number] Date of Birth: 1940/07/08  Clinical Social Work is seeking post-discharge placement for this patient at the [place] level of care (*CSW will initial, date and re-position this form in  chart as items are completed):  Yes   Patient/family provided with [place] Work Department's list of facilities offering this level of care within the geographic area requested by the patient (or if unable, by the patient's family).  Yes   Patient/family informed of their freedom to choose among providers that offer the needed level of care, that participate in Medicare, Medicaid or managed care program needed by the patient, have an available bed and are willing to accept the patient.  Yes   Patient/family informed of [facility]'s ownership interest in [facility] and [facility], as well as of the fact that they are under no obligation to receive care at these facilities.  PASRR submitted to EDS on       PASRR number received on       Existing PASRR number confirmed on 10/26/15     FL2 transmitted to all facilities in geographic area requested by pt/family on 10/26/15     FL2 transmitted to all facilities within larger geographic area on       Patient informed that his/her managed care company has contracts with or will negotiate with certain facilities, including the following:            Patient/family informed of bed offers received.  Patient chooses bed at       Physician recommends and patient chooses bed at      Patient to be transferred to   on  .  Patient to be transferred to facility by       Patient family notified on   of transfer.  Name of family member notified:        PHYSICIAN       Additional Comment:    _______________________________________________ Salome Arnt, [place] 10/26/2015, 1:55  PM [phone]

## 2015-10-26 NOTE — Clinical Social Work Note (Signed)
Clinical Social Work Assessment  Patient Details  Name: [name] [name] MRN: [id number] Date of Birth: [birth year]  Date of referral:  10/26/15               Reason for consult:  Discharge Planning                Permission sought to share information with:  Family Supports Permission granted to share information::  Yes, Verbal Permission Granted  Name::     Advertising copywriter::     Relationship::  daughter  Contact Information:     Housing/Transportation Living arrangements for the past 2 months:  Single Family Home Source of Information:  Patient, Adult Children Patient Interpreter Needed:  None Criminal Activity/Legal Involvement Pertinent to Current Situation/Hospitalization:  No - Comment as needed Significant Relationships:  Adult Children Lives with:  Other (Comment) Do you feel safe going back to the place where you live?  No Need for family participation in patient care:  Yes (Comment)  Care giving concerns:  Pt's daughter is very concerned about pt's safety at home.    Social Worker assessment / plan:  CSW met with pt and pt's daughter, [name] at bedside. Pt reports that he has two roommates at home. [name] is his best support. She is very concerned that pt has fallen several times at home and is feeling weak. At baseline, pt uses a rollator and also has a wheelchair for longer distances. He is on 4 liters of oxygen. CSW discussed placement process, including insurance authorization. Pt requests [facility] as he has been there before.   Employment status:  Retired Nurse, adult PT Recommendations:  [place] / Referral to community resources:  [place]  Patient/Family's Response to care:  Pt is agreeable to short term SNF at d/c.   Patient/Family's Understanding of and Emotional Response to Diagnosis, Current Treatment, and Prognosis:  Pt aware of recommendations and treatment plan.   Emotional  Assessment Appearance:  Appears stated age Attitude/Demeanor/Rapport:  Other (Cooperative) Affect (typically observed):  Appropriate Orientation:  Oriented to Self, Oriented to Place, Oriented to  Time, Oriented to Situation Alcohol / Substance use:  Not Applicable Psych involvement (Current and /or in the community):  No (Comment)  Discharge Needs  Concerns to be addressed:  Discharge Planning Concerns Readmission within the last 30 days:  No Current discharge risk:  Physical Impairment Barriers to Discharge:  Continued Medical Work up   [employer], [employer], [place] 10/26/2015, 2:00 PM [phone]

## 2015-10-26 NOTE — Patient Outreach (Signed)
Assessment:  CSW spoke via phone with Maryanna Shape, daughter of client, on 10/26/15. CSW verified identity of Maryanna Shape.CSW received verbal permission on 10/26/15 from Maryanna Shape for [place]e to talk with Maryanna Shape about current needs of client.  Deb and CSW spoke of client needs and of current status of client..  Client has recently been hospitalized at [facility] in [place], [place].  He is still currently receiving care at [facility] is in process of discussing with client and his daughter current discharge plan for client from the hospital.  Client had previously been living at his home with two other friends.  Client is hoping he may be able to discharge from the hospital and go for rehabilitative care to [facility] in [place], [place]. Client has resided at that facility previously. Neoma Laming said client is hoping that he can go to [facility] soon for nursing and physical therapy support. CSW talked with Neoma Laming about Gerrit Friends, the social work at [facility] in [place], [place], as a support person for client if he should admit to [facility] in [place], [place]. [place] informed Neoma Laming that client and she could talk with Gerrit Friends, if client is admitted to that facility, about equipment needs of client in the home environment. CSW and Neoma Laming spoke of client care plan. CSW encouraged that client and daughter communicate with CSW in next 30 days to discuss community resources of assistance for in home care support for client. CSW thanked Akeiba Axelson [place] for phone conversation with CSW on 10/26/15. CSW invited Stephene Alegria [place] to call CSW at [phone] as needed to discuss social work needs of client.  Plan:   Client and daughter will communicate with CSW in next 30 days to discuss community resources of assistance for in home care support of client. CSW to call client/Deborah Cherylann Banas in one week to assess needs of client at that  time.  [name].Franchon Ketterman MSW, LCSW Licensed Clinical Social Worker [facility] Care Management [phone]

## 2015-10-26 NOTE — Progress Notes (Addendum)
PROGRESS NOTE    [name] [name]  [id number] DOB: 10/08/1940 DOA: 10/24/2015 PCP: Kenn File, MD  Outpatient Specialists:     Brief Narrative:  84 yom with a Hx of COPD on home oxygen 4L/min and tobacco abuse presents with complaints of SOB, wheezing and productive cough with yellow sputum. While in the ED noted to have low oxygen saturation rate of 87%. Patient has been admitted for COPD exacerbation and started on IV Levaquin, IV steroids, and nebs.    Assessment & Plan:   Active Problems:   Acute on chronic respiratory failure (HCC)   Shortness of breath   1. COPD exacerbation, still has mild wheeze. Continue on IV steroids, IV Levaquin, and nebs. Encourage pulmonary hygiene and pulmonology is following.  2. Acute on chronic respiratory failure with hypoxia. Baseline requirements of 4L. He appears to be approaching baseline continue current treatments. 3. HTN. Continue home medications.  4. HLD. Continue statins.  5. Anxiety. Continue home medications.  6. Tobacco use. Counseled on cessation.  7. Recent falls. Patient and daughter complains of weakness and recent falls. PT has evaluated and recommends SNF on discharge.    DVT prophylaxis:  Lovenox Code Status: Full Family Communication: Daughter bedside Disposition Plan: Discharge home once improved.    Consultants:   PT-SNF   Pulmonology   Procedures:   None  Antimicrobials:   Levaquin 7/11>>   Subjective: Feels better than yesterday. Breathing is well. Uses 4L at baseline breathing is not at baseline at this moment. Complains of mild productive cough and wheezing.  Daughter bedside reports recent and frequent falls.   Objective: Filed Vitals:   10/25/15 1949 10/25/15 2114 10/26/15 0228 10/26/15 0635  BP:  128/69  108/52  Pulse:  85  66  Temp:  98 F (36.7 C)  98.4 F (36.9 C)  TempSrc:  Oral  Oral  Resp:  20  14  Height:      Weight:      SpO2: 95% 96% 96% 92%    Intake/Output Summary  (Last 24 hours) at 10/26/15 0744 Last data filed at 10/26/15 0607  Gross per 24 hour  Intake    720 ml  Output   1400 ml  Net   -680 ml   Filed Weights   10/24/15 1620 10/24/15 2141  Weight: 70.308 kg (155 lb) 72.3 kg (159 lb 6.3 oz)    Examination:  General exam: Appears calm and comfortable. Respiratory system:  Respiratory effort normal. Bilateral wheezes. Cardiovascular system: S1 & S2 heard, RRR. No JVD, murmurs, rubs, gallops or clicks. No pedal edema. Gastrointestinal system: Abdomen is nondistended, soft and nontender. No organomegaly or masses felt. Normal bowel sounds heard. Central nervous system: Alert and oriented. No focal neurological deficits. Extremities: Unremarkable. Skin: No rashes, lesions or ulcers. Psychiatry: Judgement and insight appear normal. Mood & affect appropriate.     Data Reviewed: I have personally reviewed following labs and imaging studies  CBC:  Recent Labs Lab 10/20/15 0947 10/24/15 1630 10/25/15 0459 10/26/15 0407  WBC 11.2* 11.5* 6.6 11.5*  NEUTROABS 6.7 5.8  --   --   HGB  --  10.4* 10.2* 10.1*  HCT 37.3* 33.0* 31.1* 30.5*  MCV 97 95.1 93.7 92.4  PLT  --  411* 405* [id number]*   Basic Metabolic Panel:  Recent Labs Lab 10/20/15 0947 10/24/15 1630 10/25/15 0459 10/26/15 0407  NA 142 135 136 136  K 4.5 3.9 4.2 4.0  CL 94* 96* 96* 96*  CO2 32*  35* 30 32  GLUCOSE 112* 122* 143* 152*  BUN 7* 14 14 17   CREATININE 0.80 0.82 0.68 0.64  CALCIUM 9.8 9.1 9.2 9.3  MG  --  1.9  --   --   PHOS  --  3.5  --   --    GFR: Estimated Creatinine Clearance: 71.5 mL/min (by C-G formula based on Cr of 0.64). Liver Function Tests:  Recent Labs Lab 10/20/15 0947 10/24/15 1630 10/25/15 0459 10/26/15 0407  AST 13 21 17 17   ALT 18 18 15* 16*  ALKPHOS 87 79 69 63  BILITOT 0.2 0.5 0.4 0.4  PROT 6.4 6.7 6.5 6.4*  ALBUMIN 4.0 3.5 3.4* 3.2*   No results for input(s): LIPASE, AMYLASE in the last 168 hours. No results for input(s): AMMONIA  in the last 168 hours. Coagulation Profile: No results for input(s): INR, PROTIME in the last 168 hours. Cardiac Enzymes: No results for input(s): CKTOTAL, CKMB, CKMBINDEX, TROPONINI in the last 168 hours. BNP (last 3 results) No results for input(s): PROBNP in the last 8760 hours. HbA1C: No results for input(s): HGBA1C in the last 72 hours. CBG: No results for input(s): GLUCAP in the last 168 hours. Lipid Profile: No results for input(s): CHOL, HDL, LDLCALC, TRIG, CHOLHDL, LDLDIRECT in the last 72 hours. Thyroid Function Tests:  Recent Labs  10/24/15 1630  TSH 0.589   Anemia Panel: No results for input(s): VITAMINB12, FOLATE, FERRITIN, TIBC, IRON, RETICCTPCT in the last 72 hours. Urine analysis:    Component Value Date/Time   COLORURINE YELLOW 09/24/2015 1951   APPEARANCEUR CLEAR 09/24/2015 1951   LABSPEC 1.010 09/24/2015 1951   PHURINE 7.0 09/24/2015 1951   GLUCOSEU NEGATIVE 09/24/2015 1951   HGBUR TRACE* 09/24/2015 1951   BILIRUBINUR NEGATIVE 09/24/2015 1951   KETONESUR NEGATIVE 09/24/2015 1951   PROTEINUR NEGATIVE 09/24/2015 1951   UROBILINOGEN 0.2 05/04/2014 0956   NITRITE NEGATIVE 09/24/2015 1951   LEUKOCYTESUR NEGATIVE 09/24/2015 1951   Sepsis Labs: @LABRCNTIP (procalcitonin:4,lacticidven:4)  ) Recent Results (from the past 240 hour(s))  MRSA PCR Screening     Status: None   Collection Time: 10/25/15  2:13 AM  Result Value Ref Range Status   MRSA by PCR NEGATIVE NEGATIVE Final    Comment:        The GeneXpert MRSA Assay (FDA approved for NASAL specimens only), is one component of a comprehensive MRSA colonization surveillance program. It is not intended to diagnose MRSA infection nor to guide or monitor treatment for MRSA infections.          Radiology Studies: Dg Chest 2 View  10/24/2015  CLINICAL DATA:  Shortness of breath, asthma, COPD EXAM: CHEST  2 VIEW COMPARISON:  10/24/2015 FINDINGS: Cardiomediastinal silhouette is stable. Mild  interstitial prominence bilaterally again noted. No infiltrate or pulmonary edema. Mild thoracic spine osteopenia. IMPRESSION: No active disease. Stable chronic mild interstitial prominence. No superimposed infiltrate. Electronically Signed   By: Lahoma Crocker M.D.   On: 10/24/2015 18:16   Dg Chest 2 View  10/24/2015  CLINICAL DATA:  Fall several days ago with left-sided chest pain, initial encounter EXAM: CHEST  2 VIEW COMPARISON:  09/24/2015 FINDINGS: Cardiac shadow is within normal limits. The lungs are well aerated without focal infiltrate or sizable effusion. Mild chronic interstitial changes are seen. No pneumothorax is noted. No acute bony abnormality seen. IMPRESSION: No acute abnormality noted. Electronically Signed   By: Inez Catalina M.D.   On: 10/24/2015 16:03   Nm Pulmonary Perf And Vent  10/25/2015  CLINICAL DATA:  COPD.  Short of breath with hypoxemia EXAM: NUCLEAR MEDICINE VENTILATION - PERFUSION LUNG SCAN TECHNIQUE: Ventilation images were obtained in multiple projections using inhaled aerosol Tc-2m DTPA. Perfusion images were obtained in multiple projections after intravenous injection of Tc-54m MAA. RADIOPHARMACEUTICALS:  33 mCi Technetium-1m DTPA aerosol inhalation and 4.7 mCi Technetium-38m MAA IV COMPARISON:  Chest x-ray 10/24/2015 FINDINGS: Ventilation: Patchy subsegmental perfusion defects in the upper lobes bilaterally likely due to COPD. Note is made of emphysema in the upper lobes on prior scans. Perfusion: Patchy ventilation due to aggregated macro albumin. Suboptimal profusion study. IMPRESSION: Low probability for pulmonary embolism. Changes of COPD in the apices. Electronically Signed   By: Franchot Gallo M.D.   On: 10/25/2015 11:22   US Venous Img Lower Bilateral  10/25/2015  CLINICAL DATA:  ACUTE SHORTNESS OF BREATH FOR 1 DAY, COPD, SMOKER EXAM: BILATERAL LOWER EXTREMITY VENOUS DOPPLER ULTRASOUND TECHNIQUE: Gray-scale sonography with graded compression, as well as color  Doppler and duplex ultrasound were performed to evaluate the lower extremity deep venous systems from the level of the common femoral vein and including the common femoral, femoral, profunda femoral, popliteal and calf veins including the posterior tibial, peroneal and gastrocnemius veins when visible. The superficial great saphenous vein was also interrogated. Spectral Doppler was utilized to evaluate flow at rest and with distal augmentation maneuvers in the common femoral, femoral and popliteal veins. COMPARISON:  None. FINDINGS: RIGHT LOWER EXTREMITY Common Femoral Vein: No evidence of thrombus. Normal compressibility, respiratory phasicity and response to augmentation. Saphenofemoral Junction: No evidence of thrombus. Normal compressibility and flow on color Doppler imaging. Profunda Femoral Vein: No evidence of thrombus. Normal compressibility and flow on color Doppler imaging. Femoral Vein: No evidence of thrombus. Normal compressibility, respiratory phasicity and response to augmentation. Popliteal Vein: No evidence of thrombus. Normal compressibility, respiratory phasicity and response to augmentation. Calf Veins: No evidence of thrombus. Normal compressibility and flow on color Doppler imaging. Superficial Great Saphenous Vein: No evidence of thrombus. Normal compressibility and flow on color Doppler imaging. Venous Reflux:  None. Other Findings:  None. LEFT LOWER EXTREMITY Common Femoral Vein: No evidence of thrombus. Normal compressibility, respiratory phasicity and response to augmentation. Saphenofemoral Junction: No evidence of thrombus. Normal compressibility and flow on color Doppler imaging. Profunda Femoral Vein: No evidence of thrombus. Normal compressibility and flow on color Doppler imaging. Femoral Vein: No evidence of thrombus. Normal compressibility, respiratory phasicity and response to augmentation. Popliteal Vein: No evidence of thrombus. Normal compressibility, respiratory phasicity and  response to augmentation. Calf Veins: No evidence of thrombus. Normal compressibility and flow on color Doppler imaging. Superficial Great Saphenous Vein: No evidence of thrombus. Normal compressibility and flow on color Doppler imaging. Venous Reflux:  None. Other Findings:  None. IMPRESSION: No evidence of deep venous thrombosis. Electronically Signed   By: Jerilynn Mages.  Shick M.D.   On: 10/25/2015 09:45        Scheduled Meds: . ALPRAZolam  0.5 mg Oral TID  . aspirin EC  81 mg Oral q morning - 10a  . budesonide (PULMICORT) nebulizer solution  0.25 mg Nebulization BID  . citalopram  20 mg Oral q morning - 10a  . enoxaparin (LOVENOX) injection  40 mg Subcutaneous Q24H  . ipratropium-albuterol  3 mL Nebulization Q6H  . levofloxacin (LEVAQUIN) IV  750 mg Intravenous Q24H  . loratadine  10 mg Oral q morning - 10a  . methylPREDNISolone (SOLU-MEDROL) injection  80 mg Intravenous Q6H  . montelukast  10 mg Oral QHS  .  multivitamin with minerals  1 tablet Oral Daily  . pantoprazole  80 mg Oral Daily  . simvastatin  40 mg Oral QHS  . cyanocobalamin  500 mcg Oral q morning - 10a   Continuous Infusions:    LOS: 2 days    Time spent: 25 minutes     Kathie Dike, MD [facility] If 7PM-7AM, please contact night-coverage [email or web address] Password [email or web address] 10/26/2015, 7:44 AM     By signing my name below, I, Rennis Harding, attest that this documentation has been prepared under the direction and in the presence of Kathie Dike, MD. Electronically signed: Rennis Harding, Scribe. 10/26/2015 11:48am  I, Dr. Kathie Dike, personally performed the services described in this documentaiton. All medical record entries made by the scribe were at my direction and in my presence. I have reviewed the chart and agree that the record reflects my personal performance and is accurate and complete  Kathie Dike, MD, 10/26/2015 1:10 PM

## 2015-10-27 DIAGNOSIS — I2510 Atherosclerotic heart disease of native coronary artery without angina pectoris: Secondary | ICD-10-CM | POA: Diagnosis not present

## 2015-10-27 DIAGNOSIS — I10 Essential (primary) hypertension: Secondary | ICD-10-CM | POA: Diagnosis not present

## 2015-10-27 DIAGNOSIS — E7800 Pure hypercholesterolemia, unspecified: Secondary | ICD-10-CM | POA: Diagnosis not present

## 2015-10-27 DIAGNOSIS — E784 Other hyperlipidemia: Secondary | ICD-10-CM | POA: Diagnosis not present

## 2015-10-27 DIAGNOSIS — J441 Chronic obstructive pulmonary disease with (acute) exacerbation: Secondary | ICD-10-CM | POA: Diagnosis not present

## 2015-10-27 DIAGNOSIS — F338 Other recurrent depressive disorders: Secondary | ICD-10-CM | POA: Diagnosis not present

## 2015-10-27 DIAGNOSIS — J9611 Chronic respiratory failure with hypoxia: Secondary | ICD-10-CM | POA: Diagnosis not present

## 2015-10-27 DIAGNOSIS — D090 Carcinoma in situ of bladder: Secondary | ICD-10-CM | POA: Diagnosis not present

## 2015-10-27 DIAGNOSIS — C679 Malignant neoplasm of bladder, unspecified: Secondary | ICD-10-CM | POA: Diagnosis not present

## 2015-10-27 DIAGNOSIS — R0609 Other forms of dyspnea: Secondary | ICD-10-CM | POA: Diagnosis not present

## 2015-10-27 DIAGNOSIS — K219 Gastro-esophageal reflux disease without esophagitis: Secondary | ICD-10-CM | POA: Diagnosis not present

## 2015-10-27 DIAGNOSIS — C678 Malignant neoplasm of overlapping sites of bladder: Secondary | ICD-10-CM | POA: Diagnosis not present

## 2015-10-27 DIAGNOSIS — R296 Repeated falls: Secondary | ICD-10-CM | POA: Diagnosis not present

## 2015-10-27 DIAGNOSIS — F419 Anxiety disorder, unspecified: Secondary | ICD-10-CM | POA: Diagnosis not present

## 2015-10-27 DIAGNOSIS — R2689 Other abnormalities of gait and mobility: Secondary | ICD-10-CM | POA: Diagnosis not present

## 2015-10-27 DIAGNOSIS — F339 Major depressive disorder, recurrent, unspecified: Secondary | ICD-10-CM | POA: Diagnosis not present

## 2015-10-27 DIAGNOSIS — J9621 Acute and chronic respiratory failure with hypoxia: Secondary | ICD-10-CM | POA: Diagnosis not present

## 2015-10-27 DIAGNOSIS — N400 Enlarged prostate without lower urinary tract symptoms: Secondary | ICD-10-CM | POA: Diagnosis not present

## 2015-10-27 DIAGNOSIS — J309 Allergic rhinitis, unspecified: Secondary | ICD-10-CM | POA: Diagnosis not present

## 2015-10-27 MED ORDER — GUAIFENESIN ER 600 MG PO TB12: 1200.0000 mg | ORAL_TABLET | Freq: Two times a day (BID) | ORAL | Status: DC

## 2015-10-27 MED ORDER — MONTELUKAST SODIUM 10 MG PO TABS: 10.0000 mg | ORAL_TABLET | Freq: Every day | ORAL | Status: DC

## 2015-10-27 MED ORDER — IPRATROPIUM-ALBUTEROL 0.5-2.5 (3) MG/3ML IN SOLN: 3.0000 mL | Freq: Four times a day (QID) | RESPIRATORY_TRACT | Status: DC

## 2015-10-27 MED ORDER — LEVOFLOXACIN 750 MG PO TABS: 750.0000 mg | ORAL_TABLET | Freq: Every day | ORAL | Status: DC

## 2015-10-27 MED ORDER — ALPRAZOLAM 1 MG PO TABS
0.5000 mg | ORAL_TABLET | Freq: Three times a day (TID) | ORAL | Status: DC | PRN
Start: 2015-10-27 — End: 2015-11-21

## 2015-10-27 MED ORDER — PREDNISONE 20 MG PO TABS: ORAL_TABLET | ORAL | Status: DC

## 2015-10-27 NOTE — Progress Notes (Signed)
Patient's IV removed.  Site WNL.  Patient transported by NT to main entrance for discharge with [facility] providing transport.  Patient stable at time of discharge.  Packet given to transporter with prescription.

## 2015-10-27 NOTE — Progress Notes (Signed)
Subjective: He says he feels better. He has no new complaints.  Objective: Vital signs in last 24 hours: Temp:  [97 F (36.1 C)-98.4 F (36.9 C)] 98.4 F (36.9 C) (07/14 0600) Pulse Rate:  [67-120] 67 (07/14 0600) Resp:  [18-20] 20 (07/14 0600) BP: (111-145)/(64-76) 144/64 mmHg (07/14 0600) SpO2:  [78 %-97 %] 93 % (07/14 0802) Weight change:  Last BM Date: 10/26/15  Intake/Output from previous day: 07/13 0701 - 07/14 0700 In: 720 [P.O.:720] Out: 1901 [Urine:1900; Stool:1]  PHYSICAL EXAM General appearance: alert, cooperative and no distress Resp: clear to auscultation bilaterally Cardio: regular rate and rhythm, S1, S2 normal, no murmur, click, rub or gallop GI: soft, non-tender; bowel sounds normal; no masses,  no organomegaly Extremities: extremities normal, atraumatic, no cyanosis or edema  Lab Results:  Results for orders placed or performed during the hospital encounter of 10/24/15 (from the past 48 hour(s))  Blood gas, arterial     Status: Abnormal   Collection Time: 10/25/15 11:20 AM  Result Value Ref Range   O2 Content 3.5 L/min   Delivery systems NASAL CANNULA    pH, Arterial 7.480 (H) 7.350 - 7.450   pCO2 arterial 39.0 35.0 - 45.0 mmHg   pO2, Arterial 86.9 80.0 - 100.0 mmHg   Bicarbonate 29.1 (H) 20.0 - 24.0 mEq/L   TCO2 39.0 0 - 100 mmol/L   Acid-Base Excess 5.2 (H) 0.0 - 2.0 mmol/L   O2 Saturation 97.7 %   Patient temperature 37.0    Collection site RIGHT RADIAL    Drawn by [id number]    Sample type ARTERIAL DRAW    Allens test (pass/fail) PASS PASS  CBC     Status: Abnormal   Collection Time: 10/26/15  4:07 AM  Result Value Ref Range   WBC 11.5 (H) 4.0 - 10.5 K/uL   RBC 3.30 (L) 4.22 - 5.81 MIL/uL   Hemoglobin 10.1 (L) 13.0 - 17.0 g/dL   HCT 30.5 (L) 39.0 - 52.0 %   MCV 92.4 78.0 - 100.0 fL   MCH 30.6 26.0 - 34.0 pg   MCHC 33.1 30.0 - 36.0 g/dL   RDW 14.8 11.5 - 15.5 %   Platelets 435 (H) 150 - 400 K/uL  Comprehensive metabolic panel     Status:  Abnormal   Collection Time: 10/26/15  4:07 AM  Result Value Ref Range   Sodium 136 135 - 145 mmol/L   Potassium 4.0 3.5 - 5.1 mmol/L   Chloride 96 (L) 101 - 111 mmol/L   CO2 32 22 - 32 mmol/L   Glucose, Bld 152 (H) 65 - 99 mg/dL   BUN 17 6 - 20 mg/dL   Creatinine, Ser 0.64 0.61 - 1.24 mg/dL   Calcium 9.3 8.9 - 10.3 mg/dL   Total Protein 6.4 (L) 6.5 - 8.1 g/dL   Albumin 3.2 (L) 3.5 - 5.0 g/dL   AST 17 15 - 41 U/L   ALT 16 (L) 17 - 63 U/L   Alkaline Phosphatase 63 38 - 126 U/L   Total Bilirubin 0.4 0.3 - 1.2 mg/dL   GFR calc non Af Amer >60 >60 mL/min   GFR calc Af Amer >60 >60 mL/min    Comment: (NOTE) The eGFR has been calculated using the CKD EPI equation. This calculation has not been validated in all clinical situations. eGFR's persistently <60 mL/min signify possible Chronic Kidney Disease.    Anion gap 8 5 - 15    ABGS  Recent Labs  10/25/15 1120  PHART 7.480*  PO2ART 86.9  TCO2 39.0  HCO3 29.1*   CULTURES Recent Results (from the past 240 hour(s))  MRSA PCR Screening     Status: None   Collection Time: 10/25/15  2:13 AM  Result Value Ref Range Status   MRSA by PCR NEGATIVE NEGATIVE Final    Comment:        The GeneXpert MRSA Assay (FDA approved for NASAL specimens only), is one component of a comprehensive MRSA colonization surveillance program. It is not intended to diagnose MRSA infection nor to guide or monitor treatment for MRSA infections.    Studies/Results: Nm Pulmonary Perf And Vent  10/25/2015  CLINICAL DATA:  COPD.  Short of breath with hypoxemia EXAM: NUCLEAR MEDICINE VENTILATION - PERFUSION LUNG SCAN TECHNIQUE: Ventilation images were obtained in multiple projections using inhaled aerosol Tc-40mDTPA. Perfusion images were obtained in multiple projections after intravenous injection of Tc-927mAA. RADIOPHARMACEUTICALS:  33 mCi Technetium-9979mPA aerosol inhalation and 4.7 mCi Technetium-57m64m IV COMPARISON:  Chest x-ray 10/24/2015  FINDINGS: Ventilation: Patchy subsegmental perfusion defects in the upper lobes bilaterally likely due to COPD. Note is made of emphysema in the upper lobes on prior scans. Perfusion: Patchy ventilation due to aggregated macro albumin. Suboptimal profusion study. IMPRESSION: Low probability for pulmonary embolism. Changes of COPD in the apices. Electronically Signed   By: CharFranchot Gallo.   On: 10/25/2015 11:22   Us VKoreaous Img Lower Bilateral  10/25/2015  CLINICAL DATA:  ACUTE SHORTNESS OF BREATH FOR 1 DAY, COPD, SMOKER EXAM: BILATERAL LOWER EXTREMITY VENOUS DOPPLER ULTRASOUND TECHNIQUE: Gray-scale sonography with graded compression, as well as color Doppler and duplex ultrasound were performed to evaluate the lower extremity deep venous systems from the level of the common femoral vein and including the common femoral, femoral, profunda femoral, popliteal and calf veins including the posterior tibial, peroneal and gastrocnemius veins when visible. The superficial great saphenous vein was also interrogated. Spectral Doppler was utilized to evaluate flow at rest and with distal augmentation maneuvers in the common femoral, femoral and popliteal veins. COMPARISON:  None. FINDINGS: RIGHT LOWER EXTREMITY Common Femoral Vein: No evidence of thrombus. Normal compressibility, respiratory phasicity and response to augmentation. Saphenofemoral Junction: No evidence of thrombus. Normal compressibility and flow on color Doppler imaging. Profunda Femoral Vein: No evidence of thrombus. Normal compressibility and flow on color Doppler imaging. Femoral Vein: No evidence of thrombus. Normal compressibility, respiratory phasicity and response to augmentation. Popliteal Vein: No evidence of thrombus. Normal compressibility, respiratory phasicity and response to augmentation. Calf Veins: No evidence of thrombus. Normal compressibility and flow on color Doppler imaging. Superficial Great Saphenous Vein: No evidence of thrombus.  Normal compressibility and flow on color Doppler imaging. Venous Reflux:  None. Other Findings:  None. LEFT LOWER EXTREMITY Common Femoral Vein: No evidence of thrombus. Normal compressibility, respiratory phasicity and response to augmentation. Saphenofemoral Junction: No evidence of thrombus. Normal compressibility and flow on color Doppler imaging. Profunda Femoral Vein: No evidence of thrombus. Normal compressibility and flow on color Doppler imaging. Femoral Vein: No evidence of thrombus. Normal compressibility, respiratory phasicity and response to augmentation. Popliteal Vein: No evidence of thrombus. Normal compressibility, respiratory phasicity and response to augmentation. Calf Veins: No evidence of thrombus. Normal compressibility and flow on color Doppler imaging. Superficial Great Saphenous Vein: No evidence of thrombus. Normal compressibility and flow on color Doppler imaging. Venous Reflux:  None. Other Findings:  None. IMPRESSION: No evidence of deep venous thrombosis. Electronically Signed   By: M.  Jerilynn Mageshick  M.D.   On: 10/25/2015 09:45    Medications:  Prior to Admission:  Prescriptions prior to admission  Medication Sig Dispense Refill Last Dose  . albuterol (PROVENTIL HFA;VENTOLIN HFA) 108 (90 Base) MCG/ACT inhaler Inhale 1-2 puffs into the lungs every 6 (six) hours as needed for wheezing or shortness of breath.   10/24/2015 at Unknown time  . albuterol (PROVENTIL) (2.5 MG/3ML) 0.083% nebulizer solution Take 2.5 mg by nebulization every 4 (four) hours as needed. For shortness of breath   10/24/2015 at Unknown time  . ALPRAZolam (XANAX) 1 MG tablet Take 1 mg by mouth 4 (four) times daily. anxiety   10/24/2015 at Unknown time  . aspirin EC 81 MG tablet Take 81 mg by mouth every morning.    10/24/2015 at Unknown time  . budesonide-formoterol (SYMBICORT) 160-4.5 MCG/ACT inhaler Inhale 2 puffs into the lungs 2 (two) times daily. 1 Inhaler 12 10/24/2015 at Unknown time  . citalopram (CELEXA) 20 MG  tablet Take 1 tablet by mouth every morning.    10/24/2015 at Unknown time  . COMBIVENT RESPIMAT 20-100 MCG/ACT AERS respimat Inhale 1 spray into the lungs 4 (four) times daily.   10/23/2015 at Unknown time  . cyanocobalamin 500 MCG tablet Take 500 mcg by mouth every morning.   10/24/2015 at Unknown time  . DALIRESP 500 MCG TABS tablet Take 500 mcg by mouth every morning.    10/24/2015 at Unknown time  . docusate sodium (COLACE) 100 MG capsule Take 100 mg by mouth daily as needed for mild constipation or moderate constipation.    unknown  . DULoxetine (CYMBALTA) 60 MG capsule Take 1 capsule (60 mg total) by mouth daily. (Patient taking differently: Take 1 capsule (60 mg total) by mouth daily in the morning) 30 capsule 0 10/24/2015 at Unknown time  . esomeprazole (NEXIUM) 40 MG capsule Take 40 mg by mouth every morning.   10/24/2015 at Unknown time  . GuaiFENesin (MUCINEX MAXIMUM STRENGTH PO) Take 1 tablet by mouth daily as needed (for congestion).   Past Week at Unknown time  . loratadine (CLARITIN) 10 MG tablet Take 10 mg by mouth every morning.    10/24/2015 at Unknown time  . Multiple Vitamin (MULTIVITAMIN WITH MINERALS) TABS tablet Take 1 tablet by mouth daily.   10/24/2015 at Unknown time  . NITROSTAT 0.4 MG SL tablet Place 1 tablet under the tongue every 5 (five) minutes as needed for chest pain.    unknown  . Omega-3 Fatty Acids (FISH OIL) 1000 MG CAPS Take 3,000 mg by mouth every morning.   10/24/2015 at Unknown time  . simvastatin (ZOCOR) 40 MG tablet Take 40 mg by mouth at bedtime.    10/23/2015 at Unknown time   Scheduled: . ALPRAZolam  0.5 mg Oral TID  . aspirin EC  81 mg Oral q morning - 10a  . budesonide (PULMICORT) nebulizer solution  0.25 mg Nebulization BID  . citalopram  20 mg Oral q morning - 10a  . enoxaparin (LOVENOX) injection  40 mg Subcutaneous Q24H  . guaiFENesin  1,200 mg Oral BID  . ipratropium-albuterol  3 mL Nebulization Q6H  . levofloxacin (LEVAQUIN) IV  750 mg Intravenous  Q24H  . loratadine  10 mg Oral q morning - 10a  . methylPREDNISolone (SOLU-MEDROL) injection  80 mg Intravenous Q6H  . montelukast  10 mg Oral QHS  . multivitamin with minerals  1 tablet Oral Daily  . pantoprazole  80 mg Oral Daily  . simvastatin  40 mg Oral  QHS  . cyanocobalamin  500 mcg Oral q morning - 10a   Continuous:  SNK:[id number], docusate sodium, ipratropium-albuterol, nitroGLYCERIN  Assesment: He is much improved. He had acute on chronic respiratory failure and COPD exacerbation. He is scheduled to be transferred to skilled care facility. I would send him out on a brief steroid taper of 5 or 6 days and 5 more days of oral Levaquin Active Problems:   Essential hypertension   COPD exacerbation (HCC)   Frequent falls   Acute on chronic respiratory failure (HCC)   Shortness of breath    Plan: As above. I will plan to sign off. Thanks for allowing me to see him with you    LOS: 3 days   Jamaiyah Pyle L 10/27/2015, 8:29 AM

## 2015-10-27 NOTE — Discharge Summary (Signed)
Physician Discharge Summary  [name] [name] [id number] DOB: [birth year] DOA: 10/24/2015  PCP: Kenn File, MD  Admit date: 10/24/2015 Discharge date: 10/27/2015  Admitted From: Home  Disposition:  SNF--[facility]   Recommendations for Outpatient Follow-up:  1. Follow up with PCP in 1-2 weeks 2. Please obtain BMP/CBC in one week   Home Health: No  Equipment/Devices: Home oxygen 4L  Discharge Condition: Stable  CODE STATUS: Full Diet recommendation: Heart Healthy  Brief/Interim Summary: 71 yom with a Hx of COPD on home oxygen 4L/min and tobacco abuse presents with complaints of SOB, wheezing and productive cough with yellow sputum. While in the ED noted to have low oxygen saturation rate of 87%. Patient has been admitted for COPD exacerbation and started on IV Levaquin, IV steroids, and nebs.   Discharge Diagnoses:  Active Problems:   Essential hypertension   COPD exacerbation (HCC)   Frequent falls   Acute on chronic respiratory failure (HCC)   Shortness of breath  Patient presented with complaints of SOB, wheezing and productive cough found to be due to COPD exacerbation. He was initially started on IV steroids, IV Levaquin, and nebs. During admission he was encouraged pulmonary hygiene and pulmonology followed. On discharge he was noted to have improvement in breathing and appears to be approaching baseline. He still has some mild wheezes bilaterally which is likely his baseline.He was transitioned to oral abx, steroid taper, and instructed to continue nebs PRN on discharge.   Acute on chronic respiratory failure with hypoxia. Baseline requirements of 4L. He appears to be approaching baseline continue current treatments.  HTN. Continue home medications.   HLD. Continue statins.   Anxiety. Continue home medications.   Tobacco use. Counseled on cessation.   Recent falls. Patient and daughter complains of weakness and recent falls. PT has evaluated and recommends  SNF on discharge.  Discharge Instructions      Discharge Instructions    Diet - low sodium heart healthy    Complete by:  As directed      Increase activity slowly    Complete by:  As directed             Medication List    STOP taking these medications        MUCINEX MAXIMUM STRENGTH PO  Replaced by:  guaiFENesin 600 MG 12 hr tablet      TAKE these medications        albuterol 108 (90 Base) MCG/ACT inhaler  Commonly known as:  PROVENTIL HFA;VENTOLIN HFA  Inhale 1-2 puffs into the lungs every 6 (six) hours as needed for wheezing or shortness of breath.     albuterol (2.5 MG/3ML) 0.083% nebulizer solution  Commonly known as:  PROVENTIL  Take 2.5 mg by nebulization every 4 (four) hours as needed. For shortness of breath     ALPRAZolam 1 MG tablet  Commonly known as:  XANAX  Take 0.5 tablets (0.5 mg total) by mouth 3 (three) times daily as needed for anxiety. anxiety     aspirin EC 81 MG tablet  Take 81 mg by mouth every morning.     budesonide-formoterol 160-4.5 MCG/ACT inhaler  Commonly known as:  SYMBICORT  Inhale 2 puffs into the lungs 2 (two) times daily.     citalopram 20 MG tablet  Commonly known as:  CELEXA  Take 1 tablet by mouth every morning.     COMBIVENT RESPIMAT 20-100 MCG/ACT Aers respimat  Generic drug:  Ipratropium-Albuterol  Inhale 1 spray into the lungs  4 (four) times daily.     ipratropium-albuterol 0.5-2.5 (3) MG/3ML Soln  Commonly known as:  DUONEB  Take 3 mLs by nebulization every 6 (six) hours.     cyanocobalamin 500 MCG tablet  Take 500 mcg by mouth every morning.     DALIRESP 500 MCG Tabs tablet  Generic drug:  roflumilast  Take 500 mcg by mouth every morning.     docusate sodium 100 MG capsule  Commonly known as:  COLACE  Take 100 mg by mouth daily as needed for mild constipation or moderate constipation.     DULoxetine 60 MG capsule  Commonly known as:  CYMBALTA  Take 1 capsule (60 mg total) by mouth daily.      esomeprazole 40 MG capsule  Commonly known as:  NEXIUM  Take 40 mg by mouth every morning.     Fish Oil 1000 MG Caps  Take 3,000 mg by mouth every morning.     guaiFENesin 600 MG 12 hr tablet  Commonly known as:  MUCINEX  Take 2 tablets (1,200 mg total) by mouth 2 (two) times daily.     levofloxacin 750 MG tablet  Commonly known as:  LEVAQUIN  Take 1 tablet (750 mg total) by mouth daily. For 5 more days     loratadine 10 MG tablet  Commonly known as:  CLARITIN  Take 10 mg by mouth every morning.     montelukast 10 MG tablet  Commonly known as:  SINGULAIR  Take 1 tablet (10 mg total) by mouth at bedtime.     multivitamin with minerals Tabs tablet  Take 1 tablet by mouth daily.     NITROSTAT 0.4 MG SL tablet  Generic drug:  nitroGLYCERIN  Place 1 tablet under the tongue every 5 (five) minutes as needed for chest pain.     predniSONE 20 MG tablet  Commonly known as:  DELTASONE  Take 40mg  po daily for 2 days then 30mg  daily for 2 days then 20mg  daily for 2 days then 10mg  daily for 2 days then stop     simvastatin 40 MG tablet  Commonly known as:  ZOCOR  Take 40 mg by mouth at bedtime.        Allergies  Allergen Reactions  . Iohexol Hives     Code: HIVES, Desc: PT STATES HE BROKE OUT IN HIVES AND RASH AFTER CT NECK EARLY SEPT 2011; NO RESP PROBLEMS; NEEDS PRE-MEDS; MKS, Onset Date: [id number]   . Ivp Dye [Iodinated Diagnostic Agents] Hives    Consultations:  PT-SNF   Pulmonology   Procedures/Studies: Dg Chest 2 View  10/24/2015  CLINICAL DATA:  Shortness of breath, asthma, COPD EXAM: CHEST  2 VIEW COMPARISON:  10/24/2015 FINDINGS: Cardiomediastinal silhouette is stable. Mild interstitial prominence bilaterally again noted. No infiltrate or pulmonary edema. Mild thoracic spine osteopenia. IMPRESSION: No active disease. Stable chronic mild interstitial prominence. No superimposed infiltrate. Electronically Signed   By: Lahoma Crocker M.D.   On: 10/24/2015 18:16   Dg  Chest 2 View  10/24/2015  CLINICAL DATA:  Fall several days ago with left-sided chest pain, initial encounter EXAM: CHEST  2 VIEW COMPARISON:  09/24/2015 FINDINGS: Cardiac shadow is within normal limits. The lungs are well aerated without focal infiltrate or sizable effusion. Mild chronic interstitial changes are seen. No pneumothorax is noted. No acute bony abnormality seen. IMPRESSION: No acute abnormality noted. Electronically Signed   By: Inez Catalina M.D.   On: 10/24/2015 16:03   Nm Pulmonary Perf And  Vent  10/25/2015  CLINICAL DATA:  COPD.  Short of breath with hypoxemia EXAM: NUCLEAR MEDICINE VENTILATION - PERFUSION LUNG SCAN TECHNIQUE: Ventilation images were obtained in multiple projections using inhaled aerosol Tc-8m DTPA. Perfusion images were obtained in multiple projections after intravenous injection of Tc-20m MAA. RADIOPHARMACEUTICALS:  33 mCi Technetium-58m DTPA aerosol inhalation and 4.7 mCi Technetium-73m MAA IV COMPARISON:  Chest x-ray 10/24/2015 FINDINGS: Ventilation: Patchy subsegmental perfusion defects in the upper lobes bilaterally likely due to COPD. Note is made of emphysema in the upper lobes on prior scans. Perfusion: Patchy ventilation due to aggregated macro albumin. Suboptimal profusion study. IMPRESSION: Low probability for pulmonary embolism. Changes of COPD in the apices. Electronically Signed   By: Franchot Gallo M.D.   On: 10/25/2015 11:22   US Venous Img Lower Bilateral  10/25/2015  CLINICAL DATA:  ACUTE SHORTNESS OF BREATH FOR 1 DAY, COPD, SMOKER EXAM: BILATERAL LOWER EXTREMITY VENOUS DOPPLER ULTRASOUND TECHNIQUE: Gray-scale sonography with graded compression, as well as color Doppler and duplex ultrasound were performed to evaluate the lower extremity deep venous systems from the level of the common femoral vein and including the common femoral, femoral, profunda femoral, popliteal and calf veins including the posterior tibial, peroneal and gastrocnemius veins when  visible. The superficial great saphenous vein was also interrogated. Spectral Doppler was utilized to evaluate flow at rest and with distal augmentation maneuvers in the common femoral, femoral and popliteal veins. COMPARISON:  None. FINDINGS: RIGHT LOWER EXTREMITY Common Femoral Vein: No evidence of thrombus. Normal compressibility, respiratory phasicity and response to augmentation. Saphenofemoral Junction: No evidence of thrombus. Normal compressibility and flow on color Doppler imaging. Profunda Femoral Vein: No evidence of thrombus. Normal compressibility and flow on color Doppler imaging. Femoral Vein: No evidence of thrombus. Normal compressibility, respiratory phasicity and response to augmentation. Popliteal Vein: No evidence of thrombus. Normal compressibility, respiratory phasicity and response to augmentation. Calf Veins: No evidence of thrombus. Normal compressibility and flow on color Doppler imaging. Superficial Great Saphenous Vein: No evidence of thrombus. Normal compressibility and flow on color Doppler imaging. Venous Reflux:  None. Other Findings:  None. LEFT LOWER EXTREMITY Common Femoral Vein: No evidence of thrombus. Normal compressibility, respiratory phasicity and response to augmentation. Saphenofemoral Junction: No evidence of thrombus. Normal compressibility and flow on color Doppler imaging. Profunda Femoral Vein: No evidence of thrombus. Normal compressibility and flow on color Doppler imaging. Femoral Vein: No evidence of thrombus. Normal compressibility, respiratory phasicity and response to augmentation. Popliteal Vein: No evidence of thrombus. Normal compressibility, respiratory phasicity and response to augmentation. Calf Veins: No evidence of thrombus. Normal compressibility and flow on color Doppler imaging. Superficial Great Saphenous Vein: No evidence of thrombus. Normal compressibility and flow on color Doppler imaging. Venous Reflux:  None. Other Findings:  None. IMPRESSION:  No evidence of deep venous thrombosis. Electronically Signed   By: Jerilynn Mages.  Shick M.D.   On: 10/25/2015 09:45      Subjective: Doing well. Breathing has improved.   Discharge Exam: Filed Vitals:   10/26/15 2146 10/27/15 0600  BP: 111/76 144/64  Pulse: 81 67  Temp: 97.9 F (36.6 C) 98.4 F (36.9 C)  Resp: 20 20   Filed Vitals:   10/27/15 0121 10/27/15 0600 10/27/15 0752 10/27/15 0802  BP:  144/64    Pulse:  67    Temp:  98.4 F (36.9 C)    TempSrc:  Oral    Resp:  20    Height:      Weight:  SpO2: 92% 96% 93% 93%    General: Pt is alert, awake, not in acute distress Cardiovascular: RRR, S1/S2 +, no rubs, no gallops Respiratory: CTA bilaterally, Bilateral wheezes, no rhonchi Abdominal: Soft, NT, ND, bowel sounds + Extremities: no edema, no cyanosis    The results of significant diagnostics from this hospitalization (including imaging, microbiology, ancillary and laboratory) are listed below for reference.     Microbiology: Recent Results (from the past 240 hour(s))  MRSA PCR Screening     Status: None   Collection Time: 10/25/15  2:13 AM  Result Value Ref Range Status   MRSA by PCR NEGATIVE NEGATIVE Final    Comment:        The GeneXpert MRSA Assay (FDA approved for NASAL specimens only), is one component of a comprehensive MRSA colonization surveillance program. It is not intended to diagnose MRSA infection nor to guide or monitor treatment for MRSA infections.      Labs: BNP (last 3 results)  Recent Labs  10/24/15 1630  BNP 123XX123   Basic Metabolic Panel:  Recent Labs Lab 10/24/15 1630 10/25/15 0459 10/26/15 0407  NA 135 136 136  K 3.9 4.2 4.0  CL 96* 96* 96*  CO2 35* 30 32  GLUCOSE 122* 143* 152*  BUN 14 14 17   CREATININE 0.82 0.68 0.64  CALCIUM 9.1 9.2 9.3  MG 1.9  --   --   PHOS 3.5  --   --    Liver Function Tests:  Recent Labs Lab 10/24/15 1630 10/25/15 0459 10/26/15 0407  AST 21 17 17   ALT 18 15* 16*  ALKPHOS 79 69 63   BILITOT 0.5 0.4 0.4  PROT 6.7 6.5 6.4*  ALBUMIN 3.5 3.4* 3.2*   No results for input(s): LIPASE, AMYLASE in the last 168 hours. No results for input(s): AMMONIA in the last 168 hours. CBC:  Recent Labs Lab 10/24/15 1630 10/25/15 0459 10/26/15 0407  WBC 11.5* 6.6 11.5*  NEUTROABS 5.8  --   --   HGB 10.4* 10.2* 10.1*  HCT 33.0* 31.1* 30.5*  MCV 95.1 93.7 92.4  PLT 411* 405* 435*   Cardiac Enzymes: No results for input(s): CKTOTAL, CKMB, CKMBINDEX, TROPONINI in the last 168 hours. BNP: Invalid input(s): POCBNP CBG: No results for input(s): GLUCAP in the last 168 hours. D-Dimer No results for input(s): DDIMER in the last 72 hours. Hgb A1c No results for input(s): HGBA1C in the last 72 hours. Lipid Profile No results for input(s): CHOL, HDL, LDLCALC, TRIG, CHOLHDL, LDLDIRECT in the last 72 hours. Thyroid function studies  Recent Labs  10/24/15 1630  TSH 0.589   Anemia work up No results for input(s): VITAMINB12, FOLATE, FERRITIN, TIBC, IRON, RETICCTPCT in the last 72 hours. Urinalysis    Component Value Date/Time   COLORURINE YELLOW 09/24/2015 1951   APPEARANCEUR CLEAR 09/24/2015 1951   LABSPEC 1.010 09/24/2015 1951   PHURINE 7.0 09/24/2015 1951   GLUCOSEU NEGATIVE 09/24/2015 1951   HGBUR TRACE* 09/24/2015 1951   BILIRUBINUR NEGATIVE 09/24/2015 1951   KETONESUR NEGATIVE 09/24/2015 1951   PROTEINUR NEGATIVE 09/24/2015 1951   UROBILINOGEN 0.2 05/04/2014 0956   NITRITE NEGATIVE 09/24/2015 1951   LEUKOCYTESUR NEGATIVE 09/24/2015 1951   Sepsis Labs Invalid input(s): PROCALCITONIN,  WBC,  LACTICIDVEN Microbiology Recent Results (from the past 240 hour(s))  MRSA PCR Screening     Status: None   Collection Time: 10/25/15  2:13 AM  Result Value Ref Range Status   MRSA by PCR NEGATIVE NEGATIVE Final  Comment:        The GeneXpert MRSA Assay (FDA approved for NASAL specimens only), is one component of a comprehensive MRSA colonization surveillance program. It  is not intended to diagnose MRSA infection nor to guide or monitor treatment for MRSA infections.      Time coordinating discharge: Over 30 minutes  SIGNED:   Kathie Dike, MD   [facility] 10/27/2015, 12:10 PM If 7PM-7AM, please contact night-coverage [email or web address] Password [email or web address]    By signing my name below, I, Rennis Harding, attest that this documentation has been prepared under the direction and in the presence of Kathie Dike, MD. Electronically signed: Rennis Harding, Scribe. 10/27/2015 11:38  I, Dr. Kathie Dike, personally performed the services described in this documentaiton. All medical record entries made by the scribe were at my direction and in my presence. I have reviewed the chart and agree that the record reflects my personal performance and is accurate and complete  Kathie Dike, MD, 10/27/2015 12:10 PM

## 2015-10-27 NOTE — Progress Notes (Signed)
Report called to [place], RN at [facility].

## 2015-10-27 NOTE — Care Management Important Message (Signed)
Important Message  Patient Details  Name: CASHEN KURYLO MRN: [id number] Date of Birth: 1940/05/17   Medicare Important Message Given:  Yes    Zoya Sprecher, Chauncey Reading, RN 10/27/2015, 8:04 AM

## 2015-10-27 NOTE — Progress Notes (Signed)
Physical Therapy Treatment Patient Details Name: [name] [name] MRN: [id number] DOB: [birth year] Today's Date: 10/27/2015    History of Present Illness 75 yo M admitted 10/24/2015 with SOB. Pt reports ~10 falls within the last 6 months.  He states that he doesn't know what happens when he falls, his legs just give out.  Dx: acute on chronic respiratory failure with hypoxia due to COPD exacerbation. VQ scan impression: low probability for PE. [place] of B LE's: (-) DVT.  PMH: COPD on home O2 at 4L/min, tobacco abuse, asthma, GERD, BPH, bladder tumor s/p transurethral resection, pulmonary nodule, chronic back pain, chronic anxiety, emphysema, depression, Pin in R ring finger s/p fx, cholecystectomy.     PT Comments    Pt received in bed, and was agreeable to PT tx.  Pt continues to have limited gait distance due to SpO2 desaturate.  Pt remains on 4L of O2 via [place] at rest.  This was increased to 6L via [place] during mobility training, however still demonstrates desaturation of SpO2 to 84%.  Pt required seated rest break to be able to resaturate >90%.  Pt educated on importance of purse lipped breathing, however he tends to be a strong mouth breather.  Continue to recommend SNF with continued PT due to multiple admissions, multiple recent falls, and increased O2 demands at this time.    Follow Up Recommendations  SNF;Supervision/Assistance - 24 hour     Equipment Recommendations  None recommended by PT    Recommendations for Other Services OT consult     Precautions / Restrictions Precautions Precautions: Fall Precaution Comments: Pt has had ~10 falls in the last 6 months.  Restrictions Weight Bearing Restrictions: No    Mobility  Bed Mobility Overal bed mobility: Modified Independent                Transfers Overall transfer level: Needs assistance Equipment used: Rolling walker (2 wheeled)   Sit to Stand: Min guard         General transfer comment: Vc's for hand  placement  Ambulation/Gait Ambulation/Gait assistance: Min guard Ambulation Distance (Feet): 40 Feet Assistive device: Rolling walker (2 wheeled) Gait Pattern/deviations: Step-through pattern     General Gait Details: vc's for slowed pace due to desaturation to 85% on 4L.  Pt required seated rest break and increase in O2 to 6L to be able to recover >90%.  Pt ambulated again x 9ft with O2 at 6L and he was able to maintain 90% for the first 57ft, then required standing rest break, and SpO2 desaturated to 84%.  Pt able to resaturate >90% after seated rest break.     Stairs            Wheelchair Mobility    Modified Rankin (Stroke Patients Only)       Balance                                    Cognition Arousal/Alertness: Awake/alert Behavior During Therapy: WFL for tasks assessed/performed Overall Cognitive Status: Impaired/Different from baseline                      Exercises General Exercises - Lower Extremity Short Arc Quad: Strengthening;Both;10 reps;Seated Hip Flexion/Marching: Strengthening;Both;10 reps;Seated    General Comments        Pertinent Vitals/Pain Pain Assessment: No/denies pain    Home Living  Prior Function            PT Goals (current goals can now be found in the care plan section) Acute Rehab PT Goals Patient Stated Goal: Pt expressed that he wants to go home.  PT Goal Formulation: With patient Time For Goal Achievement: 11/02/15 Potential to Achieve Goals: Fair Progress towards PT goals: Progressing toward goals    Frequency  Min 4X/week    PT Plan Current plan remains appropriate    Co-evaluation             End of Session Equipment Utilized During Treatment: Gait belt;Oxygen Activity Tolerance: Patient limited by fatigue Patient left: in chair;with call bell/phone within reach     Time: 0955-1028 PT Time Calculation (min) (ACUTE ONLY): 33 min  Charges:  $Gait  Training: 8-22 mins $Therapeutic Exercise: 8-22 mins                    G Codes:      Beth Kauri Garson, PT, DPT X: [phone]

## 2015-10-27 NOTE — Clinical Social Work Placement (Signed)
   CLINICAL SOCIAL WORK PLACEMENT  NOTE  Date:  10/27/2015  Patient Details  Name: [name] [name] MRN: [id number] Date of Birth: [birth year]  Clinical Social Work is seeking post-discharge placement for this patient at the [place] level of care (*CSW will initial, date and re-position this form in  chart as items are completed):  Yes   Patient/family provided with [facility] Work Department's list of facilities offering this level of care within the geographic area requested by the patient (or if unable, by the patient's family).  Yes   Patient/family informed of their freedom to choose among providers that offer the needed level of care, that participate in Medicare, Medicaid or managed care program needed by the patient, have an available bed and are willing to accept the patient.  Yes   Patient/family informed of [facility]'s ownership interest in [facility] and [facility], as well as of the fact that they are under no obligation to receive care at these facilities.  PASRR submitted to EDS on       PASRR number received on       Existing PASRR number confirmed on 10/26/15     FL2 transmitted to all facilities in geographic area requested by pt/family on 10/26/15     FL2 transmitted to all facilities within larger geographic area on       Patient informed that his/her managed care company has contracts with or will negotiate with certain facilities, including the following:        Yes   Patient/family informed of bed offers received.  Patient chooses bed at [facility]     Physician recommends and patient chooses bed at      Patient to be transferred to [facility] on 10/27/15.  Patient to be transferred to facility by facility [facility]     Patient family notified on 10/27/15 of transfer.  Name of family member notified:  Suzi Roots- daughter (voicemail left)     PHYSICIAN       Additional Comment:  Auth:  [id number]  _______________________________________________ Salome Arnt, LCSW 10/27/2015, 12:09 PM [phone]

## 2015-10-29 DIAGNOSIS — F338 Other recurrent depressive disorders: Secondary | ICD-10-CM | POA: Diagnosis not present

## 2015-10-29 DIAGNOSIS — K219 Gastro-esophageal reflux disease without esophagitis: Secondary | ICD-10-CM | POA: Diagnosis not present

## 2015-10-29 DIAGNOSIS — N400 Enlarged prostate without lower urinary tract symptoms: Secondary | ICD-10-CM | POA: Diagnosis not present

## 2015-10-29 DIAGNOSIS — E784 Other hyperlipidemia: Secondary | ICD-10-CM | POA: Diagnosis not present

## 2015-10-29 DIAGNOSIS — J441 Chronic obstructive pulmonary disease with (acute) exacerbation: Secondary | ICD-10-CM | POA: Diagnosis not present

## 2015-10-29 DIAGNOSIS — J9611 Chronic respiratory failure with hypoxia: Secondary | ICD-10-CM | POA: Diagnosis not present

## 2015-10-29 DIAGNOSIS — C678 Malignant neoplasm of overlapping sites of bladder: Secondary | ICD-10-CM | POA: Diagnosis not present

## 2015-11-01 DIAGNOSIS — C679 Malignant neoplasm of bladder, unspecified: Secondary | ICD-10-CM | POA: Diagnosis not present

## 2015-11-01 DIAGNOSIS — D090 Carcinoma in situ of bladder: Secondary | ICD-10-CM | POA: Diagnosis not present

## 2015-11-02 ENCOUNTER — Other Ambulatory Visit: Payer: Self-pay | Admitting: Licensed Clinical Social Worker

## 2015-11-02 NOTE — Patient Outreach (Signed)
Assessment:  CSW spoke with client via phone on 11/02/15. CSW verified client identity. CSW and client spoke of client needs. Client is currently residing at [facility] in [place], [place]. He is receiving nursing care at facility. He is also receiving physical therapy sessions as scheduled at facility. He has support from his daughter, [name] [name]. Client was previously residing at home with two [name]. Client is hoping to receive physical therapy support at facility as needed. He hopes to eventually discharge from facility and return to his home with needed supports in place. CSW and client spoke of client care plan. CSW encouraged client to communicate with CSW in next 30 days to discuss community resources of assistance to assist client with in home care support for client. Client said he hopes to discharge to his home soon. He said he is receiving physical therapy support and that physical therapy sessions are helpful to him. He said he is using a walker to help with ambulation. He said he is using oxygen as prescribed to assist him with breathing.  CSW encouraged client to communicate with [name] [name], facility social worker, regarding finalizing client discharge plan. [name] [name] said he would communicate with [name] [name], facility social worker, to finalize client discharge plan. CSW thanked client for phone conversation with CSW on 11/02/15. [name] [name] was appreciative of phone call from [name] [name] on 11/02/15.   Plan: Client to communicate with CSW in next 30 days to discuss community resources of assistance to assist client with in home care support for client. CSW to call client in 3 weeks to assess client needs at that time.   [name] [name].[name] [name] MSW, LCSW Licensed Clinical Social Worker [facility] Care Management [phone]

## 2015-11-03 ENCOUNTER — Other Ambulatory Visit: Payer: Self-pay | Admitting: Licensed Clinical Social Worker

## 2015-11-03 NOTE — Patient Outreach (Signed)
[facility]) Care Management  [facility] Social Work  11/03/2015  [name] [name] 1941/01/31 [id number]  Subjective:    Objective:   Encounter Medications:  Outpatient Encounter Prescriptions as of 11/03/2015  Medication Sig Note  . albuterol (PROVENTIL HFA;VENTOLIN HFA) 108 (90 Base) MCG/ACT inhaler Inhale 1-2 puffs into the lungs every 6 (six) hours as needed for wheezing or shortness of breath.   Marland Kitchen albuterol (PROVENTIL) (2.5 MG/3ML) 0.083% nebulizer solution Take 2.5 mg by nebulization every 4 (four) hours as needed. For shortness of breath   . ALPRAZolam (XANAX) 1 MG tablet Take 0.5 tablets (0.5 mg total) by mouth 3 (three) times daily as needed for anxiety. anxiety   . aspirin EC 81 MG tablet Take 81 mg by mouth every morning.    . budesonide-formoterol (SYMBICORT) 160-4.5 MCG/ACT inhaler Inhale 2 puffs into the lungs 2 (two) times daily.   . citalopram (CELEXA) 20 MG tablet Take 1 tablet by mouth every morning.    . COMBIVENT RESPIMAT 20-100 MCG/ACT AERS respimat Inhale 1 spray into the lungs 4 (four) times daily. 10/24/2015: Received from: External Pharmacy Received Sig:   . cyanocobalamin 500 MCG tablet Take 500 mcg by mouth every morning.   Marland Kitchen DALIRESP 500 MCG TABS tablet Take 500 mcg by mouth every morning.    . docusate sodium (COLACE) 100 MG capsule Take 100 mg by mouth daily as needed for mild constipation or moderate constipation.    . DULoxetine (CYMBALTA) 60 MG capsule Take 1 capsule (60 mg total) by mouth daily. (Patient taking differently: Take 1 capsule (60 mg total) by mouth daily in the morning)   . esomeprazole (NEXIUM) 40 MG capsule Take 40 mg by mouth every morning.   Marland Kitchen guaiFENesin (MUCINEX) 600 MG 12 hr tablet Take 2 tablets (1,200 mg total) by mouth 2 (two) times daily.   Marland Kitchen ipratropium-albuterol (DUONEB) 0.5-2.5 (3) MG/3ML SOLN Take 3 mLs by nebulization every 6 (six) hours.   Marland Kitchen levofloxacin (LEVAQUIN) 750 MG tablet Take 1 tablet (750 mg total) by mouth  daily. For 5 more days   . loratadine (CLARITIN) 10 MG tablet Take 10 mg by mouth every morning.    . montelukast (SINGULAIR) 10 MG tablet Take 1 tablet (10 mg total) by mouth at bedtime.   . Multiple Vitamin (MULTIVITAMIN WITH MINERALS) TABS tablet Take 1 tablet by mouth daily.   Marland Kitchen NITROSTAT 0.4 MG SL tablet Place 1 tablet under the tongue every 5 (five) minutes as needed for chest pain.    . Omega-3 Fatty Acids (FISH OIL) 1000 MG CAPS Take 3,000 mg by mouth every morning.   . predniSONE (DELTASONE) 20 MG tablet Take 23m po daily for 2 days then 379mdaily for 2 days then 2072maily for 2 days then 24m70mily for 2 days then stop   . simvastatin (ZOCOR) 40 MG tablet Take 40 mg by mouth at bedtime.     No facility-administered encounter medications on file as of 11/03/2015.    Functional Status:  In your present state of health, do you have any difficulty performing the following activities: 10/24/2015 10/19/2015  Hearing? Y Y [name] [name]? N N  Difficulty concentrating or making decisions? Y Y [name] [name] or climbing stairs? Y Y  Dressing or bathing? Y Y  Doing errands, shopping? Y Y [name] Donningeparing Food and eating ? - Y  Using the Toilet? - N  In the past six months, have you accidently leaked urine? - N  Do you  have problems with loss of bowel control? - N  Managing your Medications? - Y  Managing your Finances? - Y  Housekeeping or managing your Housekeeping? - Y    Fall/Depression Screening:  PHQ 2/9 Scores 11/02/2015 10/26/2015 10/24/2015 10/20/2015 10/19/2015 08/30/2015 08/18/2015  PHQ - 2 Score 2 2 0 2 2 0 0  PHQ- 9 Score 8 7 - 2 9 - -    Assessment:   CSW traveled to [facility] in [place], [place] on 11/03/15 to visit client. CSW met with client on 11/03/15 at client's room at [facility] in [place]. Client said he was receiving nursing care and physical therapy support at facility. He said that physical therapy sessions were helpful to him.  He said he is eating well and sleeping well.  Client said he  is taking medications as prescribed.  Client is using oxygen 24/7.  He is currently using oxygen via nasal canula at 4 liters per minute continuously. He said he sometimes gets fatigued or short of breath with ambulation.  He uses a walker to help him ambulate.  Client said he hopes to discharge soon and be able to return home.  He has support at home from his daughter, [name].  Client said he believes that he will be discharging from [facility] on  November 15, 2015. CSW encouraged client to speak with Gerrit Friends, facility social worker, to finalize client discharge plan.  Client did not mention any pain issues. CSW spoke with client about client care plan.  CSW encouraged client to speak with CSW in next 30 days about community resources to assist client with in home care support.  CSW and client completed [facility] consent form for client on 11/03/15. Client received white copy of completed [facility] client consent form.  CSW gave client [facility] CSW cards. CSW encouraged [place] to call CSW as needed to discuss social work needs of client.     Plan:   Client to communicate with CSW in next 30 days to discuss community resources of assistance to assist client with in home care needs of client.  CSW to call client in 3 weeks to assess client needs at that time.  [name].Colburn Asper MSW, LCSW Licensed Clinical Social Worker [facility] Care Management [phone]

## 2015-11-11 ENCOUNTER — Other Ambulatory Visit: Payer: Self-pay | Admitting: Family Medicine

## 2015-11-16 DIAGNOSIS — I10 Essential (primary) hypertension: Secondary | ICD-10-CM | POA: Diagnosis not present

## 2015-11-16 DIAGNOSIS — S51802D Unspecified open wound of left forearm, subsequent encounter: Secondary | ICD-10-CM | POA: Diagnosis not present

## 2015-11-16 DIAGNOSIS — J449 Chronic obstructive pulmonary disease, unspecified: Secondary | ICD-10-CM | POA: Diagnosis not present

## 2015-11-16 DIAGNOSIS — S51002D Unspecified open wound of left elbow, subsequent encounter: Secondary | ICD-10-CM | POA: Diagnosis not present

## 2015-11-16 DIAGNOSIS — J9611 Chronic respiratory failure with hypoxia: Secondary | ICD-10-CM | POA: Diagnosis not present

## 2015-11-17 DIAGNOSIS — J9611 Chronic respiratory failure with hypoxia: Secondary | ICD-10-CM | POA: Diagnosis not present

## 2015-11-17 DIAGNOSIS — I10 Essential (primary) hypertension: Secondary | ICD-10-CM | POA: Diagnosis not present

## 2015-11-17 DIAGNOSIS — J449 Chronic obstructive pulmonary disease, unspecified: Secondary | ICD-10-CM | POA: Diagnosis not present

## 2015-11-17 DIAGNOSIS — S51802D Unspecified open wound of left forearm, subsequent encounter: Secondary | ICD-10-CM | POA: Diagnosis not present

## 2015-11-17 DIAGNOSIS — S51002D Unspecified open wound of left elbow, subsequent encounter: Secondary | ICD-10-CM | POA: Diagnosis not present

## 2015-11-21 ENCOUNTER — Encounter: Payer: Self-pay | Admitting: Family Medicine

## 2015-11-21 ENCOUNTER — Ambulatory Visit (INDEPENDENT_AMBULATORY_CARE_PROVIDER_SITE_OTHER): Payer: Commercial Managed Care - HMO | Admitting: Family Medicine

## 2015-11-21 ENCOUNTER — Ambulatory Visit: Payer: Self-pay | Admitting: Licensed Clinical Social Worker

## 2015-11-21 VITALS — BP 101/62 | HR 97 | Temp 97.1°F | Ht 65.0 in | Wt 157.8 lb

## 2015-11-21 DIAGNOSIS — J439 Emphysema, unspecified: Secondary | ICD-10-CM | POA: Diagnosis not present

## 2015-11-21 DIAGNOSIS — S51012D Laceration without foreign body of left elbow, subsequent encounter: Secondary | ICD-10-CM | POA: Diagnosis not present

## 2015-11-21 DIAGNOSIS — J449 Chronic obstructive pulmonary disease, unspecified: Secondary | ICD-10-CM | POA: Diagnosis not present

## 2015-11-21 DIAGNOSIS — S51802D Unspecified open wound of left forearm, subsequent encounter: Secondary | ICD-10-CM | POA: Diagnosis not present

## 2015-11-21 DIAGNOSIS — F419 Anxiety disorder, unspecified: Secondary | ICD-10-CM

## 2015-11-21 DIAGNOSIS — J9611 Chronic respiratory failure with hypoxia: Secondary | ICD-10-CM | POA: Diagnosis not present

## 2015-11-21 DIAGNOSIS — S51002D Unspecified open wound of left elbow, subsequent encounter: Secondary | ICD-10-CM | POA: Diagnosis not present

## 2015-11-21 DIAGNOSIS — I10 Essential (primary) hypertension: Secondary | ICD-10-CM

## 2015-11-21 MED ORDER — ALPRAZOLAM 1 MG PO TABS: 0.5000 mg | ORAL_TABLET | Freq: Three times a day (TID) | ORAL | 0 refills | Status: DC | PRN

## 2015-11-21 NOTE — Patient Instructions (Signed)
Great to see you!  Please bring in your medicines and inhalers to a nursing visit in the week or so.   Lets see you again in 1 month  I am very happy you have cut back on xanax, Continue 1/2 pill (0.5 mg) three times a day

## 2015-11-21 NOTE — Progress Notes (Signed)
   HPI  Patient presents today for follow-up after discharge from SNF.  She was hospitalized last month with another COPD exacerbation, he was discharged to skilled nursing facility and appears to be doing much better since discharge. He and his daughter explained that they have changed inhalers, they are unclear which ones, and have decreased his Sinemet dose.  He states that he is much more alert and his anxiety is controlled on the lower dose of Xanax. He describes taking one half of a 1 mg pill 3 times a day instead of 1 mg 4 times a day  COPD Breathing is at baseline today, he feels much better than before on into the hospital.   PMH: Smoking status noted ROS: Per HPI  Objective: BP 101/62   Pulse 97   Temp 97.1 F (36.2 C) (Oral)   Ht 5\' 5"  (1.651 m)   Wt 157 lb 12.8 oz (71.6 kg)   SpO2 95%   BMI 26.26 kg/m  Gen: NAD, alert, cooperative with exam HEENT: NCAT CV: RRR, good S1/S2, no murmur Resp: Nonlabored, mild expiratory wheezes scattered, reasonable air movement, 4 L of oxygen via nasal cannula in place Ext: No edema, warm Neuro: Alert and oriented, No gross deficits  Skin Left elbow with healing skin tear, some thick fluid present underneath the Tegaderm. Foul smell with removal of the film. Areas cleaned, reveals 4 small lesions consistent with healing skin tear. There is no tenderness to palpation, induration, drainage, fluctuance, or warmth to indicate active infection.  Assessment and plan:  # COPD Back to baseline, on 4 L oxygen via nasal cannula Encouraged him to continue his current medications Unclear what his new inhaler is, I recommended a one-week follow-up with the nurse for med rec.  # Anxiety I am pleased to see that he is better controlled on 0.5 mg 3 times daily, this is a much better dose as compared to 1 mg 4 times daily previously. His affect appears brighter today. Continue Cymbalta I will plan to refill 0.5 mg Xanax No. 90 per month.  I  have discussed with him on several different occasions the dangers of using such high benzodiazepine doses with COPD. I will assume prescribing for this with the hopes that we can titrate his doses down gradually over the next 6-9 months.  # Anemia Normocytic, likely anemia of chronic disease. Repeat CBC today  Skin tear Dressing removed today and replaced by nursing, I have examined it prior to replacement of dressing The current bandage has been in place for 1 week, he did have a foul-smelling taking the bandage off, Tegaderm, there are no signs of infection saw believe the foul smell is more likely from leaving the bandage on for too long.   Hypertension Well-controlled with only diet Labs   Laroy Apple, MD [facility] 11/21/2015, 4:02 PM

## 2015-11-22 DIAGNOSIS — S51002D Unspecified open wound of left elbow, subsequent encounter: Secondary | ICD-10-CM | POA: Diagnosis not present

## 2015-11-22 DIAGNOSIS — J449 Chronic obstructive pulmonary disease, unspecified: Secondary | ICD-10-CM | POA: Diagnosis not present

## 2015-11-22 DIAGNOSIS — I10 Essential (primary) hypertension: Secondary | ICD-10-CM | POA: Diagnosis not present

## 2015-11-22 DIAGNOSIS — J9611 Chronic respiratory failure with hypoxia: Secondary | ICD-10-CM | POA: Diagnosis not present

## 2015-11-22 DIAGNOSIS — S51802D Unspecified open wound of left forearm, subsequent encounter: Secondary | ICD-10-CM | POA: Diagnosis not present

## 2015-11-22 LAB — CMP14+EGFR
A/G RATIO: 1.6 (ref 1.2–2.2)
ALT: 19 IU/L (ref 0–44)
AST: 16 IU/L (ref 0–40)
Albumin: 4.2 g/dL (ref 3.5–4.8)
Alkaline Phosphatase: 116 IU/L (ref 39–117)
BUN/Creatinine Ratio: 13 (ref 10–24)
BUN: 11 mg/dL (ref 8–27)
CHLORIDE: 96 mmol/L (ref 96–106)
CO2: 31 mmol/L — ABNORMAL HIGH (ref 18–29)
Calcium: 9.9 mg/dL (ref 8.6–10.2)
Creatinine, Ser: 0.82 mg/dL (ref 0.76–1.27)
GFR calc Af Amer: 101 mL/min/{1.73_m2} (ref >59)
GFR calc non Af Amer: 87 mL/min/{1.73_m2} (ref >59)
GLUCOSE: 89 mg/dL (ref 65–99)
Globulin, Total: 2.7 g/dL (ref 1.5–4.5)
POTASSIUM: 4.6 mmol/L (ref 3.5–5.2)
Sodium: 142 mmol/L (ref 134–144)
TOTAL PROTEIN: 6.9 g/dL (ref 6.0–8.5)

## 2015-11-22 LAB — CBC WITH DIFFERENTIAL/PLATELET
Basophils Absolute: 0 10*3/uL (ref 0.0–0.2)
Basos: 0 %
EOS (ABSOLUTE): 0.7 10*3/uL — AB (ref 0.0–0.4)
Eos: 6 %
Hematocrit: 37.4 % — ABNORMAL LOW (ref 37.5–51.0)
Hemoglobin: 11.9 g/dL — ABNORMAL LOW (ref 12.6–17.7)
IMMATURE GRANS (ABS): 0.1 10*3/uL (ref 0.0–0.1)
Immature Granulocytes: 1 %
LYMPHS: 33 %
Lymphocytes Absolute: 4.1 10*3/uL — ABNORMAL HIGH (ref 0.7–3.1)
MCH: 29.4 pg (ref 26.6–33.0)
MCHC: 31.8 g/dL (ref 31.5–35.7)
MCV: 92 fL (ref 79–97)
MONOCYTES: 11 %
Monocytes Absolute: 1.3 10*3/uL — ABNORMAL HIGH (ref 0.1–0.9)
NEUTROS PCT: 49 %
Neutrophils Absolute: 6.2 10*3/uL (ref 1.4–7.0)
PLATELETS: 486 10*3/uL — AB (ref 150–379)
RBC: 4.05 x10E6/uL — ABNORMAL LOW (ref 4.14–5.80)
RDW: 14.7 % (ref 12.3–15.4)
WBC: 12.3 10*3/uL — AB (ref 3.4–10.8)

## 2015-11-23 ENCOUNTER — Other Ambulatory Visit: Payer: Self-pay | Admitting: Licensed Clinical Social Worker

## 2015-11-23 DIAGNOSIS — S51802D Unspecified open wound of left forearm, subsequent encounter: Secondary | ICD-10-CM | POA: Diagnosis not present

## 2015-11-23 DIAGNOSIS — S51002D Unspecified open wound of left elbow, subsequent encounter: Secondary | ICD-10-CM | POA: Diagnosis not present

## 2015-11-23 DIAGNOSIS — J9611 Chronic respiratory failure with hypoxia: Secondary | ICD-10-CM | POA: Diagnosis not present

## 2015-11-23 DIAGNOSIS — J449 Chronic obstructive pulmonary disease, unspecified: Secondary | ICD-10-CM | POA: Diagnosis not present

## 2015-11-23 DIAGNOSIS — I10 Essential (primary) hypertension: Secondary | ICD-10-CM | POA: Diagnosis not present

## 2015-11-23 NOTE — Patient Outreach (Signed)
Assessment:  CSW called client home phone number on 11/23/15 and spoke via phone with client. CSW verified client identity. Client had previously resided at [facility] facility in [place], [place]. Client had been received nursing care at [facility] facility. Client had also received physical therapy sessions, as scheduled, at [facility] facility.  Client recently discharged from [facility] in [place], [place] and returned to his home. His daughter, Maryanna Shape, lives nearby and tries to help client as she is able. Client said he has his prescribed medications and is taking medications as prescribed. Client sees Dr. Wendi Snipes as his primary care doctor. Client had an appointment with Dr. Wendi Snipes on 11/21/15.  Client informed Dr. Wendi Snipes that client was taking lower dose of xanax and that this was helpful to client. Dr. Wendi Snipes talked with client about client dosage amount for prescribed xanax.  CSW and client spoke of client care plan. CSW encouraged client to communicate with CSW in next 30 days to discuss community resources to help clent with in home care needs of client.  Client said he receives some support from his daughter. Client said he has a walker and a cane to help him walk. Client said he is receiving physical therapy sessions as scheduled with [facility].  Client said he also has help as scheduled with home health aide from [facility].  Client said he thought physical therapy services were helpful to client. Client said he is eating well and sleeping well.  He said he thought home health aide assisting him in the home as scheduled was helpful.  Client receives Meals on Wheels lunch meals 5 days per week.  CSW talked with client about [name] benefit for meal support. Client and his daughter said they plan to call [facility] insurance customer service to request that client  receive frozen meals support as part of his [name] benefits. CSW encouraged client to call [facility] customer service to  request frozen meals benefit for client.  CSW thanked client for phone call with CSW. CSW encouraged client to call CSW at [phone] as needed to discuss social work needs of client.  Plan:  Client to communicate with CSW in next 30 days to discuss community resources to help client with in home care needs of client. CSW to collaborate with [name], Nurse Practitioner, in monitoring client needs. CSW to call client in 4 weeks to assess client needs.  [name].Marcena Dias MSW, LCSW Licensed Clinical Social Worker [facility] Care Management [phone]

## 2015-11-24 DIAGNOSIS — S51002D Unspecified open wound of left elbow, subsequent encounter: Secondary | ICD-10-CM | POA: Diagnosis not present

## 2015-11-24 DIAGNOSIS — J449 Chronic obstructive pulmonary disease, unspecified: Secondary | ICD-10-CM | POA: Diagnosis not present

## 2015-11-24 DIAGNOSIS — J9611 Chronic respiratory failure with hypoxia: Secondary | ICD-10-CM | POA: Diagnosis not present

## 2015-11-24 DIAGNOSIS — S51802D Unspecified open wound of left forearm, subsequent encounter: Secondary | ICD-10-CM | POA: Diagnosis not present

## 2015-11-24 DIAGNOSIS — I10 Essential (primary) hypertension: Secondary | ICD-10-CM | POA: Diagnosis not present

## 2015-11-25 DIAGNOSIS — J9611 Chronic respiratory failure with hypoxia: Secondary | ICD-10-CM | POA: Diagnosis not present

## 2015-11-25 DIAGNOSIS — A419 Sepsis, unspecified organism: Secondary | ICD-10-CM | POA: Diagnosis not present

## 2015-11-25 DIAGNOSIS — J449 Chronic obstructive pulmonary disease, unspecified: Secondary | ICD-10-CM | POA: Diagnosis not present

## 2015-11-25 DIAGNOSIS — R51 Headache: Secondary | ICD-10-CM | POA: Diagnosis not present

## 2015-11-27 DIAGNOSIS — S51002D Unspecified open wound of left elbow, subsequent encounter: Secondary | ICD-10-CM | POA: Diagnosis not present

## 2015-11-27 DIAGNOSIS — J9611 Chronic respiratory failure with hypoxia: Secondary | ICD-10-CM | POA: Diagnosis not present

## 2015-11-27 DIAGNOSIS — I10 Essential (primary) hypertension: Secondary | ICD-10-CM | POA: Diagnosis not present

## 2015-11-27 DIAGNOSIS — S51802D Unspecified open wound of left forearm, subsequent encounter: Secondary | ICD-10-CM | POA: Diagnosis not present

## 2015-11-27 DIAGNOSIS — J449 Chronic obstructive pulmonary disease, unspecified: Secondary | ICD-10-CM | POA: Diagnosis not present

## 2015-11-28 ENCOUNTER — Encounter: Payer: Self-pay | Admitting: Family Medicine

## 2015-11-28 ENCOUNTER — Ambulatory Visit (INDEPENDENT_AMBULATORY_CARE_PROVIDER_SITE_OTHER): Payer: Commercial Managed Care - HMO | Admitting: Family Medicine

## 2015-11-28 VITALS — BP 101/66 | HR 99 | Ht 65.0 in | Wt 157.5 lb

## 2015-11-28 DIAGNOSIS — Z79899 Other long term (current) drug therapy: Secondary | ICD-10-CM

## 2015-11-28 DIAGNOSIS — S51802D Unspecified open wound of left forearm, subsequent encounter: Secondary | ICD-10-CM | POA: Diagnosis not present

## 2015-11-28 DIAGNOSIS — J9611 Chronic respiratory failure with hypoxia: Secondary | ICD-10-CM | POA: Diagnosis not present

## 2015-11-28 DIAGNOSIS — R399 Unspecified symptoms and signs involving the genitourinary system: Secondary | ICD-10-CM | POA: Diagnosis not present

## 2015-11-28 DIAGNOSIS — S51002D Unspecified open wound of left elbow, subsequent encounter: Secondary | ICD-10-CM | POA: Diagnosis not present

## 2015-11-28 DIAGNOSIS — B351 Tinea unguium: Secondary | ICD-10-CM | POA: Diagnosis not present

## 2015-11-28 DIAGNOSIS — L84 Corns and callosities: Secondary | ICD-10-CM | POA: Diagnosis not present

## 2015-11-28 DIAGNOSIS — J449 Chronic obstructive pulmonary disease, unspecified: Secondary | ICD-10-CM | POA: Diagnosis not present

## 2015-11-28 DIAGNOSIS — J439 Emphysema, unspecified: Secondary | ICD-10-CM | POA: Diagnosis not present

## 2015-11-28 DIAGNOSIS — I70203 Unspecified atherosclerosis of native arteries of extremities, bilateral legs: Secondary | ICD-10-CM | POA: Diagnosis not present

## 2015-11-28 DIAGNOSIS — I10 Essential (primary) hypertension: Secondary | ICD-10-CM | POA: Diagnosis not present

## 2015-11-28 LAB — URINALYSIS, COMPLETE
BILIRUBIN UA: NEGATIVE
GLUCOSE, UA: NEGATIVE
KETONES UA: NEGATIVE
Nitrite, UA: NEGATIVE
SPEC GRAV UA: 1.02 (ref 1.005–1.030)
Urobilinogen, Ur: 0.2 mg/dL (ref 0.2–1.0)
pH, UA: 6 (ref 5.0–7.5)

## 2015-11-28 LAB — MICROSCOPIC EXAMINATION
Bacteria, UA: NONE SEEN
RBC, UA: >30 /hpf — AB (ref 0–?)

## 2015-11-28 MED ORDER — FLUTICASONE FUROATE-VILANTEROL 200-25 MCG/INH IN AEPB: 1.0000 | INHALATION_SPRAY | Freq: Every day | RESPIRATORY_TRACT | Status: DC

## 2015-11-28 MED ORDER — ALBUTEROL SULFATE (2.5 MG/3ML) 0.083% IN NEBU: 2.5000 mg | INHALATION_SOLUTION | RESPIRATORY_TRACT | 5 refills | Status: AC | PRN

## 2015-11-28 MED ORDER — UMECLIDINIUM BROMIDE 62.5 MCG/INH IN AEPB
1.0000 | INHALATION_SPRAY | Freq: Every day | RESPIRATORY_TRACT | Status: DC
Start: 2015-11-28 — End: 2016-01-01

## 2015-11-28 MED ORDER — CEFDINIR 300 MG PO CAPS: 300.0000 mg | ORAL_CAPSULE | Freq: Two times a day (BID) | ORAL | 0 refills | Status: DC

## 2015-11-28 NOTE — Progress Notes (Signed)
   HPI  Patient presents today here with symptoms of UTI.  Patient states he had dysuria over the last 2 days. He denies any fever, chills, abdominal pain, or foul-smelling urine. He has a somewhat recent history of bladder cancer, however he has not had recent instrumentation. He is due to go back to urology in the next few weeks and states that they will not the scope wife a need to unless he has been treated for his UTI, if is a UTI. He's tolerating food and fluids normally  Polypharmacy Medicines were reconciled today. Inhalers are as follows: Combivent, albuterol, Symbicort, and breo He is breathing much better than previously, breo was then you edition from the nursing home.    PMH: Smoking status noted ROS: Per HPI  Objective: BP 101/66   Pulse 99   Ht 5\' 5"  (1.651 m)   Wt 157 lb 8 oz (71.4 kg)   BMI 26.21 kg/m  Gen: NAD, alert, cooperative with exam HEENT: NCAT, EOMI, PERRL CV: RRR, good S1/S2, no murmur Resp: Nonlabored, scattered expiratory wheezes, good air movement, oxygen via nasal cannula Abd: SNTND, BS present, no guarding or organomegaly Ext: No edema, warm Neuro: Alert and oriented, No gross deficits  Assessment and plan:  # Dysuria Treating his UTI given history of bladder cancer Urinalysis is only moderately suspicious for UTI Sent for culture No signs of pyelonephritis  # COPD Stable Taking symbicort plus Breo BID Handwritten prescription to discontinue all inhalers except for albuterol and nebulized albuterol. Increase Breo to max strength Add incruse, stop duoneb and combivent (only using 2 puffs once daily) Ok to continue nebulized albuterol and MDI, I have written with a sharpie on each inhaler.   Polypharmacy Meds reconciled carefully today, several discontinued via Rx to pharmacy, they form weekly blister packs.  Simplified inhalers    Orders Placed This Encounter  Procedures  . Urine culture  . Urinalysis, Complete    Meds  ordered this encounter  Medications  . DISCONTD: fluticasone furoate-vilanterol (BREO ELLIPTA) 100-25 MCG/INH AEPB    Sig: Inhale 2 puffs into the lungs 2 (two) times daily.  . cefdinir (OMNICEF) 300 MG capsule    Sig: Take 1 capsule (300 mg total) by mouth 2 (two) times daily. 1 po BID    Dispense:  20 capsule    Refill:  0  . fluticasone furoate-vilanterol (BREO ELLIPTA) 200-25 MCG/INH 1 puff  . umeclidinium bromide (INCRUSE ELLIPTA) 62.5 MCG/INH 1 puff  . albuterol (PROVENTIL) (2.5 MG/3ML) 0.083% nebulizer solution    Sig: Take 3 mLs (2.5 mg total) by nebulization every 4 (four) hours as needed. For shortness of breath    Dispense:  75 mL    Refill:  [place], MD [place] 11/28/2015, 11:16 AM

## 2015-11-28 NOTE — Patient Instructions (Signed)
New inhalers:  Albuterol, rescue inhaler Breo (new strength) 1 puff once a day Incruse, 1 puff once a  Day with breo   Urinary Tract Infection Urinary tract infections (UTIs) can develop anywhere along your urinary tract. Your urinary tract is your body's drainage system for removing wastes and extra water. Your urinary tract includes two kidneys, two ureters, a bladder, and a urethra. Your kidneys are a pair of bean-shaped organs. Each kidney is about the size of your fist. They are located below your ribs, one on each side of your spine. CAUSES Infections are caused by microbes, which are microscopic organisms, including fungi, viruses, and bacteria. These organisms are so small that they can only be seen through a microscope. Bacteria are the microbes that most commonly cause UTIs. SYMPTOMS  Symptoms of UTIs may vary by age and gender of the patient and by the location of the infection. Symptoms in young women typically include a frequent and intense urge to urinate and a painful, burning feeling in the bladder or urethra during urination. Older women and men are more likely to be tired, shaky, and weak and have muscle aches and abdominal pain. A fever may mean the infection is in your kidneys. Other symptoms of a kidney infection include pain in your back or sides below the ribs, nausea, and vomiting. DIAGNOSIS To diagnose a UTI, your caregiver will ask you about your symptoms. Your caregiver will also ask you to provide a urine sample. The urine sample will be tested for bacteria and white blood cells. White blood cells are made by your body to help fight infection. TREATMENT  Typically, UTIs can be treated with medication. Because most UTIs are caused by a bacterial infection, they usually can be treated with the use of antibiotics. The choice of antibiotic and length of treatment depend on your symptoms and the type of bacteria causing your infection. HOME CARE INSTRUCTIONS  If you were  prescribed antibiotics, take them exactly as your caregiver instructs you. Finish the medication even if you feel better after you have only taken some of the medication.  Drink enough water and fluids to keep your urine clear or pale yellow.  Avoid caffeine, tea, and carbonated beverages. They tend to irritate your bladder.  Empty your bladder often. Avoid holding urine for long periods of time.  Empty your bladder before and after sexual intercourse.  After a bowel movement, women should cleanse from front to back. Use each tissue only once. SEEK MEDICAL CARE IF:   You have back pain.  You develop a fever.  Your symptoms do not begin to resolve within 3 days. SEEK IMMEDIATE MEDICAL CARE IF:   You have severe back pain or lower abdominal pain.  You develop chills.  You have nausea or vomiting.  You have continued burning or discomfort with urination. MAKE SURE YOU:   Understand these instructions.  Will watch your condition.  Will get help right away if you are not doing well or get worse.   This information is not intended to replace advice given to you by your health care provider. Make sure you discuss any questions you have with your health care provider.   Document Released: 01/09/2005 Document Revised: 12/21/2014 Document Reviewed: 05/10/2011 Elsevier Interactive Patient Education [employer].

## 2015-11-30 LAB — URINE CULTURE

## 2015-12-05 ENCOUNTER — Other Ambulatory Visit: Payer: Self-pay | Admitting: *Deleted

## 2015-12-05 DIAGNOSIS — J449 Chronic obstructive pulmonary disease, unspecified: Secondary | ICD-10-CM | POA: Diagnosis not present

## 2015-12-05 DIAGNOSIS — J9611 Chronic respiratory failure with hypoxia: Secondary | ICD-10-CM | POA: Diagnosis not present

## 2015-12-05 DIAGNOSIS — I10 Essential (primary) hypertension: Secondary | ICD-10-CM | POA: Diagnosis not present

## 2015-12-05 DIAGNOSIS — S51002D Unspecified open wound of left elbow, subsequent encounter: Secondary | ICD-10-CM | POA: Diagnosis not present

## 2015-12-05 DIAGNOSIS — S51802D Unspecified open wound of left forearm, subsequent encounter: Secondary | ICD-10-CM | POA: Diagnosis not present

## 2015-12-05 MED ORDER — CITALOPRAM HYDROBROMIDE 20 MG PO TABS: 20.0000 mg | ORAL_TABLET | Freq: Every morning | ORAL | 6 refills | Status: DC

## 2015-12-07 ENCOUNTER — Other Ambulatory Visit: Payer: Self-pay | Admitting: *Deleted

## 2015-12-07 NOTE — Patient Outreach (Signed)
Transition of care call #1. I've just learned that pt is home from [facility]. I have called, there was no answer and I left a message requesting a return call. I will keep calling daily X 3 until I am able to reach pt or he or his daughter call me back.  [name] [facility] [place] [phone]

## 2015-12-08 ENCOUNTER — Other Ambulatory Visit: Payer: Self-pay | Admitting: *Deleted

## 2015-12-08 ENCOUNTER — Ambulatory Visit: Payer: Self-pay | Admitting: *Deleted

## 2015-12-08 NOTE — Patient Outreach (Signed)
Attempted transition of care call. No one answered and I left a voice mail and requested a return call.  I will try to call him again on Monday.  [name] [facility] [place] [phone]

## 2015-12-11 ENCOUNTER — Encounter: Payer: Self-pay | Admitting: *Deleted

## 2015-12-11 ENCOUNTER — Other Ambulatory Visit: Payer: Self-pay | Admitting: *Deleted
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
## 2015-12-13 ENCOUNTER — Telehealth: Payer: Self-pay | Admitting: Family Medicine

## 2015-12-13 NOTE — Telephone Encounter (Signed)
Is there anyway he can make it in today? He needs an exam, he has ended up in the hospital with COPD exacerbations before and we need to do exam to know what medicines he needs. We can fit him into my schedule today if there are no triage spots.

## 2015-12-13 NOTE — Telephone Encounter (Signed)
Patient of Dr Wendi Snipes. Can you please advise and route to Pool B

## 2015-12-13 NOTE — Telephone Encounter (Signed)
Daughter states that patient does not want to be seen and thinks he will be okay. That he thought he was getting a cold but now thinks he is okay. Daughter states that patient will call back if it gets worse. Just FYI.

## 2015-12-14 ENCOUNTER — Other Ambulatory Visit: Payer: Self-pay | Admitting: *Deleted

## 2015-12-14 ENCOUNTER — Ambulatory Visit (INDEPENDENT_AMBULATORY_CARE_PROVIDER_SITE_OTHER): Payer: Commercial Managed Care - HMO | Admitting: Family Medicine

## 2015-12-14 DIAGNOSIS — I2510 Atherosclerotic heart disease of native coronary artery without angina pectoris: Secondary | ICD-10-CM

## 2015-12-14 DIAGNOSIS — F419 Anxiety disorder, unspecified: Secondary | ICD-10-CM | POA: Diagnosis not present

## 2015-12-14 DIAGNOSIS — F339 Major depressive disorder, recurrent, unspecified: Secondary | ICD-10-CM | POA: Diagnosis not present

## 2015-12-14 DIAGNOSIS — Z9181 History of falling: Secondary | ICD-10-CM

## 2015-12-14 DIAGNOSIS — J449 Chronic obstructive pulmonary disease, unspecified: Secondary | ICD-10-CM | POA: Diagnosis not present

## 2015-12-14 DIAGNOSIS — I10 Essential (primary) hypertension: Secondary | ICD-10-CM | POA: Diagnosis not present

## 2015-12-14 DIAGNOSIS — J9611 Chronic respiratory failure with hypoxia: Secondary | ICD-10-CM | POA: Diagnosis not present

## 2015-12-14 MED ORDER — VENTOLIN HFA 108 (90 BASE) MCG/ACT IN AERS: 2.0000 | INHALATION_SPRAY | Freq: Four times a day (QID) | RESPIRATORY_TRACT | 2 refills | Status: DC | PRN

## 2015-12-14 MED ORDER — MONTELUKAST SODIUM 10 MG PO TABS: 10.0000 mg | ORAL_TABLET | Freq: Every day | ORAL | 2 refills | Status: DC

## 2015-12-15 ENCOUNTER — Other Ambulatory Visit: Payer: Self-pay

## 2015-12-15 MED ORDER — MONTELUKAST SODIUM 10 MG PO TABS: 10.0000 mg | ORAL_TABLET | Freq: Every day | ORAL | 2 refills | Status: DC

## 2015-12-19 ENCOUNTER — Other Ambulatory Visit: Payer: Self-pay | Admitting: *Deleted

## 2015-12-19 NOTE — Patient Outreach (Signed)
Transition of care call attempted, there was no answer at pt's home or at his daughter's. I left a message on each one and requested a return call.[name] [facility] [place] [phone]

## 2015-12-20 ENCOUNTER — Other Ambulatory Visit: Payer: Self-pay | Admitting: *Deleted

## 2015-12-20 NOTE — Telephone Encounter (Signed)
Last filled 10/09/2015. If approved please route to pool for nurse to call into pharmacy

## 2015-12-21 MED ORDER — ALPRAZOLAM 0.5 MG PO TABS: 0.5000 mg | ORAL_TABLET | Freq: Three times a day (TID) | ORAL | 2 refills | Status: DC | PRN

## 2015-12-21 NOTE — Telephone Encounter (Signed)
Refilled per our discussion at last OV.   Changing to 0.5 mg dose  Laroy Apple, MD [facility] 12/21/2015, 9:04 AM

## 2015-12-22 ENCOUNTER — Ambulatory Visit: Payer: Commercial Managed Care - HMO | Admitting: Family Medicine

## 2015-12-26 ENCOUNTER — Other Ambulatory Visit: Payer: Self-pay | Admitting: *Deleted

## 2015-12-26 DIAGNOSIS — J449 Chronic obstructive pulmonary disease, unspecified: Secondary | ICD-10-CM | POA: Diagnosis not present

## 2015-12-26 NOTE — Patient Outreach (Signed)
Transition of care call: Unable to reach pt's daughter to verify pt status. I have left a message for her to return my call.  [name] [facility] [address] [phone]

## 2015-12-27 ENCOUNTER — Other Ambulatory Visit: Payer: Self-pay | Admitting: Licensed Clinical Social Worker

## 2015-12-27 NOTE — Patient Outreach (Signed)
Assessment:  CSW spoke via phone with client on 12/27/15. CSW verified client identity. CSW received verbal permission from client on 12/27/15 for CSW to speak with client about current client needs. Client receives some support from his daughter, Maryanna Shape, who lives nearby. Client has prescribed medications and is taking medications as prescribed. Client sees Dr. Wendi Snipes as his primary care doctor. CSW and client spoke of client care plan. CSW encouraged client to communicate with CSW in next 30 days to discuss community resources to assist client with in home client care needs. Client said he is eating well and sleeping well. Client had been receiving in home physical therapy sessions since his discharge from a skilled nursing facility. Client has now completed his in home physical therapy sessions. He walks with use of a cane or walker.   Client receives Meals on Wheels support. He receives lunch meals 5 days weekly from Meals on Wheels program. Client has also been receiving support from [name], Nurse Practitioner.  Client continues to reside at his home with two other men.  CSW encouraged client to call CSW as needed at [phone] to discuss social work needs.of client   Plan:  Client to communicate with CSW in next 30 days to discuss community resources to assist client with in home client care needs.  CSW to call client in 4 weeks to assess client needs.   [name].Ersel Wadleigh MSW, LCSW Licensed Clinical Social Worker [facility] Care Management [phone]

## 2015-12-29 ENCOUNTER — Other Ambulatory Visit: Payer: Self-pay | Admitting: *Deleted

## 2015-12-29 NOTE — Patient Outreach (Signed)
Transition of care call attempted, no answer, left message to return my call.  [name] [name] [facility] [place] [phone]

## 2015-12-30 ENCOUNTER — Telehealth: Payer: Self-pay | Admitting: Family Medicine

## 2015-12-30 NOTE — Telephone Encounter (Signed)
Left detailed message that we will not be able to call in an antibiotic for a URI that hasn't been assessed.

## 2016-01-01 ENCOUNTER — Other Ambulatory Visit: Payer: Self-pay | Admitting: *Deleted

## 2016-01-01 ENCOUNTER — Ambulatory Visit (INDEPENDENT_AMBULATORY_CARE_PROVIDER_SITE_OTHER): Payer: Commercial Managed Care - HMO | Admitting: Family

## 2016-01-01 ENCOUNTER — Encounter: Payer: Self-pay | Admitting: Family

## 2016-01-01 VITALS — BP 99/64 | HR 80 | Temp 97.3°F | Ht 65.0 in | Wt 164.0 lb

## 2016-01-01 DIAGNOSIS — R51 Headache: Secondary | ICD-10-CM | POA: Diagnosis not present

## 2016-01-01 DIAGNOSIS — F17200 Nicotine dependence, unspecified, uncomplicated: Secondary | ICD-10-CM

## 2016-01-01 DIAGNOSIS — A419 Sepsis, unspecified organism: Secondary | ICD-10-CM | POA: Diagnosis not present

## 2016-01-01 DIAGNOSIS — J9611 Chronic respiratory failure with hypoxia: Secondary | ICD-10-CM | POA: Diagnosis not present

## 2016-01-01 DIAGNOSIS — R21 Rash and other nonspecific skin eruption: Secondary | ICD-10-CM

## 2016-01-01 DIAGNOSIS — R05 Cough: Secondary | ICD-10-CM | POA: Diagnosis not present

## 2016-01-01 DIAGNOSIS — Z720 Tobacco use: Secondary | ICD-10-CM

## 2016-01-01 DIAGNOSIS — J441 Chronic obstructive pulmonary disease with (acute) exacerbation: Secondary | ICD-10-CM | POA: Diagnosis not present

## 2016-01-01 DIAGNOSIS — R058 Other specified cough: Secondary | ICD-10-CM

## 2016-01-01 DIAGNOSIS — R319 Hematuria, unspecified: Secondary | ICD-10-CM

## 2016-01-01 DIAGNOSIS — J449 Chronic obstructive pulmonary disease, unspecified: Secondary | ICD-10-CM | POA: Diagnosis not present

## 2016-01-01 MED ORDER — UMECLIDINIUM BROMIDE 62.5 MCG/INH IN AEPB: 1.0000 | INHALATION_SPRAY | Freq: Every day | RESPIRATORY_TRACT | 11 refills | Status: DC

## 2016-01-01 MED ORDER — FLUTICASONE FUROATE-VILANTEROL 200-25 MCG/INH IN AEPB: 1.0000 | INHALATION_SPRAY | Freq: Every day | RESPIRATORY_TRACT | 11 refills | Status: DC

## 2016-01-01 MED ORDER — PREDNISONE 10 MG (21) PO TBPK: ORAL_TABLET | ORAL | 0 refills | Status: DC

## 2016-01-01 NOTE — Patient Outreach (Signed)
Transition of care call: I spoke with pt's daughter, [name] [name], today. She reports her father is not being very active and spends a lot of time in bed. She has noted that he has a cough and a runny nose. He is smoking quite a bit. She says she encourages him to stop smoking, be active, take his meds (she gives his meds to him daily, but she leaves a dose for the other part of the day that she is not sure he takes). She says they still have not heard from anyone about his CAP caregiver.  I have encouraged her to continue to encourage her father to be as active as possible so he doesn't loose the strength he gained with therapy. To try to smoke as little as possible so he can breathe better, take meds as ordered. I advised that if he has a cough and runny nose he should go to his MD so he gets treatment and avoids a hospitalization.  She said she would make an appointment for him.  I will call and find out about where in the process is he for getting a CAP worker.  I will touch base with Deb when I get information and then again next week for the final transition of care call.  __________________________  I also called and talked to [name] [name]. He says he is doing fine, taking meds, etc. (I believe he says what he thinks I want to hear). I have advised him he should see his MD today and it does look like an appointment has been scheduled for 5:30 pm.    [name] [facility] [place] [phone]

## 2016-01-01 NOTE — Patient Instructions (Signed)
Chronic Obstructive Pulmonary Disease Chronic obstructive pulmonary disease (COPD) is a common lung condition in which airflow from the lungs is limited. COPD is a general term that can be used to describe many different lung problems that limit airflow, including both chronic bronchitis and emphysema. If you have COPD, your lung function will probably never return to normal, but there are measures you can take to improve lung function and make yourself feel better. CAUSES   Smoking (common).  Exposure to secondhand smoke.  Genetic problems.  Chronic inflammatory lung diseases or recurrent infections. SYMPTOMS  Shortness of breath, especially with physical activity.  Deep, persistent (chronic) cough with a large amount of thick mucus.  Wheezing.  Rapid breaths (tachypnea).  Gray or bluish discoloration (cyanosis) of the skin, especially in your fingers, toes, or lips.  Fatigue.  Weight loss.  Frequent infections or episodes when breathing symptoms become much worse (exacerbations).  Chest tightness. DIAGNOSIS Your health care provider will take a medical history and perform a physical examination to diagnose COPD. Additional tests for COPD may include:  Lung (pulmonary) function tests.  Chest X-ray.  CT scan.  Blood tests. TREATMENT  Treatment for COPD may include:  Inhaler and nebulizer medicines. These help manage the symptoms of COPD and make your breathing more comfortable.  Supplemental oxygen. Supplemental oxygen is only helpful if you have a low oxygen level in your blood.  Exercise and physical activity. These are beneficial for nearly all people with COPD.  Lung surgery or transplant.  Nutrition therapy to gain weight, if you are underweight.  Pulmonary rehabilitation. This may involve working with a team of health care providers and specialists, such as respiratory, occupational, and physical therapists. HOME CARE INSTRUCTIONS  Take all medicines  (inhaled or pills) as directed by your health care provider.  Avoid over-the-counter medicines or cough syrups that dry up your airway (such as antihistamines) and slow down the elimination of secretions unless instructed otherwise by your health care provider.  If you are a smoker, the most important thing that you can do is stop smoking. Continuing to smoke will cause further lung damage and breathing trouble. Ask your health care provider for help with quitting smoking. He or she can direct you to community resources or hospitals that provide support.  Avoid exposure to irritants such as smoke, chemicals, and fumes that aggravate your breathing.  Use oxygen therapy and pulmonary rehabilitation if directed by your health care provider. If you require home oxygen therapy, ask your health care provider whether you should purchase a pulse oximeter to measure your oxygen level at home.  Avoid contact with individuals who have a contagious illness.  Avoid extreme temperature and humidity changes.  Eat healthy foods. Eating smaller, more frequent meals and resting before meals may help you maintain your strength.  Stay active, but balance activity with periods of rest. Exercise and physical activity will help you maintain your ability to do things you want to do.  Preventing infection and hospitalization is very important when you have COPD. Make sure to receive all the vaccines your health care provider recommends, especially the pneumococcal and influenza vaccines. Ask your health care provider whether you need a pneumonia vaccine.  Learn and use relaxation techniques to manage stress.  Learn and use controlled breathing techniques as directed by your health care provider. Controlled breathing techniques include:  Pursed lip breathing. Start by breathing in (inhaling) through your nose for 1 second. Then, purse your lips as if you were   going to whistle and breathe out (exhale) through the  pursed lips for 2 seconds.  Diaphragmatic breathing. Start by putting one hand on your abdomen just above your waist. Inhale slowly through your nose. The hand on your abdomen should move out. Then purse your lips and exhale slowly. You should be able to feel the hand on your abdomen moving in as you exhale.  Learn and use controlled coughing to clear mucus from your lungs. Controlled coughing is a series of short, progressive coughs. The steps of controlled coughing are: 1. Lean your head slightly forward. 2. Breathe in deeply using diaphragmatic breathing. 3. Try to hold your breath for 3 seconds. 4. Keep your mouth slightly open while coughing twice. 5. Spit any mucus out into a tissue. 6. Rest and repeat the steps once or twice as needed. SEEK MEDICAL CARE IF:  You are coughing up more mucus than usual.  There is a change in the color or thickness of your mucus.  Your breathing is more labored than usual.  Your breathing is faster than usual. SEEK IMMEDIATE MEDICAL CARE IF:  You have shortness of breath while you are resting.  You have shortness of breath that prevents you from:  Being able to talk.  Performing your usual physical activities.  You have chest pain lasting longer than 5 minutes.  Your skin color is more cyanotic than usual.  You measure low oxygen saturations for longer than 5 minutes with a pulse oximeter. MAKE SURE YOU:  Understand these instructions.  Will watch your condition.  Will get help right away if you are not doing well or get worse.   This information is not intended to replace advice given to you by your health care provider. Make sure you discuss any questions you have with your health care provider.   Document Released: 01/09/2005 Document Revised: 04/22/2014 Document Reviewed: 11/26/2012 Elsevier Interactive Patient Education 2016 Elsevier Inc.  

## 2016-01-01 NOTE — Progress Notes (Signed)
Subjective:    Patient ID: [name] [name], male    DOB: [birth year], 75 y.o.   MRN: [id number]  Pt presents to the office with multiple problems. PT states he was diagnosed with a UTI and was started on an antibiotic. Pt states his symptoms of hematuria and dysuria have resolved, but would like to have his urine rechecked.  Cough  This is a recurrent problem. The current episode started 1 to 4 weeks ago. The problem has been unchanged. The problem occurs every few minutes. The cough is productive of sputum. Associated symptoms include headaches, a rash, a sore throat, shortness of breath ("always") and wheezing. Pertinent negatives include no chest pain, ear congestion, ear pain, myalgias, nasal congestion or postnasal drip. The symptoms are aggravated by lying down. Risk factors for lung disease include smoking/tobacco exposure. He has tried rest for the symptoms. The treatment provided mild relief. His past medical history is significant for COPD.  Rash  This is a new problem. The current episode started more than 1 month ago. The problem has been gradually worsening since onset. The affected locations include the left arm and right arm. The rash is characterized by itchiness and redness. He was exposed to nothing. Associated symptoms include coughing, shortness of breath ("always") and a sore throat. Past treatments include topical steroids. The treatment provided mild relief.      Review of Systems  HENT: Positive for sore throat. Negative for ear pain and postnasal drip.   Respiratory: Positive for cough, shortness of breath ("always") and wheezing.   Cardiovascular: Negative for chest pain and leg swelling.  Musculoskeletal: Negative for myalgias.  Skin: Positive for rash.  Neurological: Positive for headaches.  All other systems reviewed and are negative.      Objective:   Physical Exam  Constitutional: He is oriented to person, place, and time. He appears well-developed and  well-nourished. No distress.  HENT:  Head: Normocephalic.  Eyes: Pupils are equal, round, and reactive to light. Right eye exhibits no discharge. Left eye exhibits no discharge.  Neck: Normal range of motion. Neck supple. No thyromegaly present.  Cardiovascular: Normal rate, regular rhythm, normal heart sounds and intact distal pulses.   No murmur heard. Pulmonary/Chest: Effort normal. No respiratory distress. He has wheezes.  Pt on oxygen   Abdominal: Soft. Bowel sounds are normal. He exhibits no distension. There is no tenderness.  Musculoskeletal: Normal range of motion. He exhibits no edema or tenderness.  Neurological: He is alert and oriented to person, place, and time. He has normal reflexes. No cranial nerve deficit.  Skin: Skin is warm and dry. Rash noted. No erythema.  White Papule rash on bilateral arms that extends to his sleeve. Rash only on skin exposed. Ecchymosis on bilateral arms  Psychiatric: He has a normal mood and affect. His behavior is normal. Judgment and thought content normal.  Vitals reviewed.     BP 99/64 (BP Location: Right Arm, Patient Position: Sitting, Cuff Size: Normal)   Pulse 80   Temp 97.3 F (36.3 C) (Oral)   Ht 5\' 5"  (1.651 m)   Wt 164 lb (74.4 kg)   SpO2 94%   BMI 27.29 kg/m      Assessment & Plan:  1. Hematuria - Urine culture - Urinalysis  2. COPD exacerbation ([place]) -Smoking cessation discussed -Pt told to start Breo and Incruse. Pt states he has only been taking albuterol and spiriva at times. Pt told to stop spiriva. - fluticasone furoate-vilanterol (BREO ELLIPTA) 200-25  MCG/INH AEPB; Inhale 1 puff into the lungs daily.  Dispense: 1 each; Refill: 11 - umeclidinium bromide (INCRUSE ELLIPTA) 62.5 MCG/INH AEPB; Inhale 1 puff into the lungs daily.  Dispense: 1 each; Refill: 11 - predniSONE (STERAPRED UNI-PAK 21 TAB) 10 MG (21) TBPK tablet; Use as directed  Dispense: 21 tablet; Refill: 0  3. Current smoker -Smoking cessation  discussed - fluticasone furoate-vilanterol (BREO ELLIPTA) 200-25 MCG/INH AEPB; Inhale 1 puff into the lungs daily.  Dispense: 1 each; Refill: 11 - umeclidinium bromide (INCRUSE ELLIPTA) 62.5 MCG/INH AEPB; Inhale 1 puff into the lungs daily.  Dispense: 1 each; Refill: 11  4. Productive cough - fluticasone furoate-vilanterol (BREO ELLIPTA) 200-25 MCG/INH AEPB; Inhale 1 puff into the lungs daily.  Dispense: 1 each; Refill: 11 - umeclidinium bromide (INCRUSE ELLIPTA) 62.5 MCG/INH AEPB; Inhale 1 puff into the lungs daily.  Dispense: 1 each; Refill: 11  5. Rash and nonspecific skin eruption -Do not scratch - predniSONE (STERAPRED UNI-PAK 21 TAB) 10 MG (21) TBPK tablet; Use as directed  Dispense: 21 tablet; Refill: 0  Evelina Dun, FNP

## 2016-01-01 NOTE — Addendum Note (Signed)
Addended by: Ilean China on: 01/01/2016 06:33 PM   Modules accepted: Orders

## 2016-01-04 ENCOUNTER — Encounter: Payer: Self-pay | Admitting: Family Medicine

## 2016-01-08 ENCOUNTER — Other Ambulatory Visit: Payer: Self-pay | Admitting: *Deleted

## 2016-01-09 NOTE — Patient Outreach (Signed)
Transition of care final call. No answer at patient's or daughter's phone numbers. I left a message to please return my call. I advised that this is my intended last call and I would really like to speak to them.  [name] [name] [facility] [phone]

## 2016-01-10 DIAGNOSIS — D494 Neoplasm of unspecified behavior of bladder: Secondary | ICD-10-CM | POA: Diagnosis not present

## 2016-01-10 DIAGNOSIS — C679 Malignant neoplasm of bladder, unspecified: Secondary | ICD-10-CM | POA: Diagnosis not present

## 2016-01-12 ENCOUNTER — Telehealth: Payer: Self-pay | Admitting: Family Medicine

## 2016-01-12 NOTE — Telephone Encounter (Signed)
I understand if he has a UTI, however it can be treated by his urologist as well.   I would like him to be seen and have a UA and culture prior to treatment.  He can still be seen within 30 days of being discharged from the practice.    Laroy Apple, MD [facility] 01/12/2016, 12:58 PM

## 2016-01-12 NOTE — Telephone Encounter (Signed)
Daughter aware and will call urologist

## 2016-01-15 ENCOUNTER — Encounter: Payer: Self-pay | Admitting: Family Medicine

## 2016-01-15 ENCOUNTER — Ambulatory Visit (INDEPENDENT_AMBULATORY_CARE_PROVIDER_SITE_OTHER): Payer: Commercial Managed Care - HMO | Admitting: Family Medicine

## 2016-01-15 VITALS — BP 106/75 | HR 100 | Temp 97.0°F | Ht 65.0 in | Wt 160.0 lb

## 2016-01-15 DIAGNOSIS — R300 Dysuria: Secondary | ICD-10-CM | POA: Diagnosis not present

## 2016-01-15 LAB — MICROSCOPIC EXAMINATION: BACTERIA UA: NONE SEEN

## 2016-01-15 LAB — URINALYSIS, COMPLETE
BILIRUBIN UA: NEGATIVE
GLUCOSE, UA: NEGATIVE
KETONES UA: NEGATIVE
NITRITE UA: NEGATIVE
SPEC GRAV UA: 1.015 (ref 1.005–1.030)
UUROB: 0.2 mg/dL (ref 0.2–1.0)
pH, UA: 5.5 (ref 5.0–7.5)

## 2016-01-15 MED ORDER — CIPROFLOXACIN HCL 250 MG PO TABS: 250.0000 mg | ORAL_TABLET | Freq: Two times a day (BID) | ORAL | 0 refills | Status: DC

## 2016-01-15 NOTE — Patient Instructions (Addendum)
Great to see you!  I have sent Cipro to your pharmacy, your urine actually looks pretty good. I think it is smart to treat for a UTI given that you need your surgery and that you have such a complicated bladder situation.   Take pro-biotics while you are taking the antibiotics.   Please let me know if you need

## 2016-01-15 NOTE — Progress Notes (Signed)
   HPI  Patient presents today for attenuation of possible UTI.  Patient is scheduled for repeat bladder surgery for bladder cancer. He explains that he's had dysuria and increased urinary frequency with foul-smelling urine for about one week. He denies fever, chills, sweats, or other concerns about the UTI.  He is tolerating food and fluids normally.  He explains that he can easily walk to the mailbox without oxygen without causing the chest pain or concerning shortness of breath. He has severe COPD. He has history of a heart concern, he's been carrying nitroglycerin in his pocket for years but never needed it, denies history of heart disease.  PMH: Smoking status noted ROS: Per HPI  Objective: BP 106/75   Pulse 100   Temp 97 F (36.1 C) (Oral)   Ht 5\' 5"  (1.651 m)   Wt 160 lb (72.6 kg)   BMI 26.63 kg/m  Gen: NAD, alert, cooperative with exam HEENT: NCAT CV: RRR, good S1/S2, no murmur Resp: CTABL, no wheezes, non-labored Abd: Soft, mild tenderness to palpation of bilateral lower quadrants and suprapubic area, no CVA tenderness Ext: No edema, warm Neuro: Alert and oriented, No gross deficits  Assessment and plan:  # Dysuria Urinalysis is actually reassuring, given his complex latter situation with bladder cancer and recent instrumentation I will go ahead and treat him with ciprofloxacin Previous urine culture showed a Cipro sensitive enterococcus No signs of complicated UTI  Patient has been dismissed from our practice due to frequent no shows, he has been accepted to another local practice.   Her some question that he may need surgical clearance, I have recommended that he see a cardiologist if he needs surgical clearance from me, he will call back and I will gladly refer him if needed.    Orders Placed This Encounter  Procedures  . Urine culture  . Urinalysis, Complete    Meds ordered this encounter  Medications  . ciprofloxacin (CIPRO) 250 MG tablet   Sig: Take 1 tablet (250 mg total) by mouth 2 (two) times daily.    Dispense:  14 tablet    Refill:  0    Laroy Apple, MD [facility] 01/15/2016, 11:54 AM

## 2016-01-16 ENCOUNTER — Encounter: Payer: Self-pay | Admitting: *Deleted

## 2016-01-16 NOTE — Patient Outreach (Signed)
I did receive a return call from pt's daughter, Maryanna Shape. She reports her father is doing well and then she reports he has had some urinary complaints. I advised her this is exactly the type of complaint that needs to be addressed with an office visit with his primary care MD office to avoid hospitalization. She said she would make an appt and take him to see Dr. Wendi Snipes.  I have advised her that this is my last call and to please be proactive in making sure her father gets medical attention early for any complaints.  [name] [facility] [place] [phone]

## 2016-01-17 LAB — URINE CULTURE

## 2016-01-25 ENCOUNTER — Other Ambulatory Visit: Payer: Self-pay | Admitting: Family Medicine

## 2016-01-25 ENCOUNTER — Telehealth: Payer: Self-pay | Admitting: Family Medicine

## 2016-01-25 ENCOUNTER — Other Ambulatory Visit: Payer: Self-pay

## 2016-01-25 ENCOUNTER — Other Ambulatory Visit (INDEPENDENT_AMBULATORY_CARE_PROVIDER_SITE_OTHER): Payer: Commercial Managed Care - HMO

## 2016-01-25 DIAGNOSIS — Z87440 Personal history of urinary (tract) infections: Secondary | ICD-10-CM

## 2016-01-25 DIAGNOSIS — J449 Chronic obstructive pulmonary disease, unspecified: Secondary | ICD-10-CM | POA: Diagnosis not present

## 2016-01-25 DIAGNOSIS — N390 Urinary tract infection, site not specified: Secondary | ICD-10-CM | POA: Diagnosis not present

## 2016-01-25 LAB — URINALYSIS, COMPLETE
BILIRUBIN UA: NEGATIVE
GLUCOSE, UA: NEGATIVE
NITRITE UA: POSITIVE — AB
Specific Gravity, UA: 1.015 (ref 1.005–1.030)
UUROB: 1 mg/dL (ref 0.2–1.0)
pH, UA: 5.5 (ref 5.0–7.5)

## 2016-01-25 LAB — MICROSCOPIC EXAMINATION

## 2016-01-25 NOTE — Telephone Encounter (Signed)
Please call.  Does patient have symptoms of UTI and if so what are they.?

## 2016-01-25 NOTE — Telephone Encounter (Signed)
Left detailed message on daughters voicemail that she could bring by a urine specimen and we would recheck. Orders placed

## 2016-01-26 LAB — URINE CULTURE

## 2016-01-27 ENCOUNTER — Telehealth: Payer: Self-pay | Admitting: Family Medicine

## 2016-01-27 NOTE — Telephone Encounter (Signed)
pts daughter aware, copy of labs sent to urologist dr. Vito Berger

## 2016-01-27 NOTE — Telephone Encounter (Signed)
See previous note

## 2016-01-29 ENCOUNTER — Other Ambulatory Visit: Payer: Self-pay | Admitting: Licensed Clinical Social Worker

## 2016-01-29 ENCOUNTER — Encounter: Payer: Self-pay | Admitting: Licensed Clinical Social Worker

## 2016-01-29 ENCOUNTER — Telehealth: Payer: Self-pay

## 2016-01-29 NOTE — Telephone Encounter (Signed)
[name], a Education officer, museum from [facility] called wanting to know if Dr.Bradshaw could send a PCS (personal care service) application to [facility] so patient can get a home health aid. Scott aware patient is being dismissed but states that patient does not have a PCP and was wanting to know if this can be done before patient is dismissed. His number is [phone] for any questions. Please advise.

## 2016-01-29 NOTE — Telephone Encounter (Signed)
OK with request.   Laroy Apple, MD [facility] 01/29/2016, 12:23 PM

## 2016-01-29 NOTE — Patient Outreach (Signed)
Assessment:  CSW spoke via phone with client on 01/29/16. CSW verified client identity. CSW received verbal permission from client on 01/29/16 for CSW to speak with client about current client needs.  Client had been receiving [place] care with [name], Nurse Practitioner. [name] has discharged client from [place] program support. Client has been seeing Dr. Wendi Snipes as his primary care doctor.  Client has sometimes been infrequent in attending appointments with his medical provider, Dr. Wendi Snipes. Client has support from his daughter, Maryanna Shape, who lives near client and contacts client daily to monitor client needs. Client has prescribed medications and is trying to take medications as prescribed. Client did receive home health physical therapy sessions as scheduled and client has now completed all scheduled in home physical therapy sessions. CSW informed client that client had met his care plan goal with [facility] CSW services. CSW informed client that [place] would discharge client from [place] services on 01/29/16 since client had met his care plan goal with CSW services. Client agreed to this plan. CSW encouraged client to attend medical appointments scheduled with primary care doctor of client.  CSW thanked client for participating in [facility] program support.   Plan:  CSW is discharging Locklan Canoy from [facility] CSW services on 01/29/16 since client has met care plan goals with CSW services.  CSW to inform Josepha Pigg that [place] discharged client from [facility] CSW services on 01/29/16.  CSW to fax physician case closure to Dr. Wendi Snipes informing Dr. Wendi Snipes that [place] discharged client from [facility] CSW services on 01/29/16.   [name].Aizik Reh MSW, LCSW Licensed Clinical Social Worker [facility] [phone]

## 2016-01-30 NOTE — Telephone Encounter (Signed)
Done, faxed 01/30/16

## 2016-01-31 DIAGNOSIS — J449 Chronic obstructive pulmonary disease, unspecified: Secondary | ICD-10-CM | POA: Diagnosis not present

## 2016-01-31 DIAGNOSIS — J9611 Chronic respiratory failure with hypoxia: Secondary | ICD-10-CM | POA: Diagnosis not present

## 2016-01-31 DIAGNOSIS — R51 Headache: Secondary | ICD-10-CM | POA: Diagnosis not present

## 2016-01-31 DIAGNOSIS — A419 Sepsis, unspecified organism: Secondary | ICD-10-CM | POA: Diagnosis not present

## 2016-02-21 ENCOUNTER — Other Ambulatory Visit: Payer: Self-pay | Admitting: Family Medicine

## 2016-02-25 DIAGNOSIS — J449 Chronic obstructive pulmonary disease, unspecified: Secondary | ICD-10-CM | POA: Diagnosis not present

## 2016-03-02 ENCOUNTER — Encounter (HOSPITAL_COMMUNITY): Payer: Self-pay | Admitting: Emergency Medicine

## 2016-03-02 ENCOUNTER — Emergency Department (HOSPITAL_COMMUNITY): Payer: Commercial Managed Care - HMO

## 2016-03-02 ENCOUNTER — Emergency Department (HOSPITAL_COMMUNITY)
Admission: EM | Admit: 2016-03-02 | Discharge: 2016-03-02 | Disposition: A | Discharge status: Home / Self Care | Payer: Commercial Managed Care - HMO | Attending: Emergency Medicine | Admitting: Emergency Medicine

## 2016-03-02 DIAGNOSIS — W19XXXA Unspecified fall, initial encounter: Secondary | ICD-10-CM

## 2016-03-02 DIAGNOSIS — W010XXA Fall on same level from slipping, tripping and stumbling without subsequent striking against object, initial encounter: Secondary | ICD-10-CM | POA: Diagnosis not present

## 2016-03-02 DIAGNOSIS — J441 Chronic obstructive pulmonary disease with (acute) exacerbation: Secondary | ICD-10-CM | POA: Diagnosis not present

## 2016-03-02 DIAGNOSIS — S299XXA Unspecified injury of thorax, initial encounter: Secondary | ICD-10-CM | POA: Diagnosis not present

## 2016-03-02 DIAGNOSIS — Y939 Activity, unspecified: Secondary | ICD-10-CM | POA: Insufficient documentation

## 2016-03-02 DIAGNOSIS — Z79899 Other long term (current) drug therapy: Secondary | ICD-10-CM | POA: Diagnosis not present

## 2016-03-02 DIAGNOSIS — F17210 Nicotine dependence, cigarettes, uncomplicated: Secondary | ICD-10-CM | POA: Insufficient documentation

## 2016-03-02 DIAGNOSIS — S298XXA Other specified injuries of thorax, initial encounter: Secondary | ICD-10-CM | POA: Diagnosis not present

## 2016-03-02 DIAGNOSIS — Z7982 Long term (current) use of aspirin: Secondary | ICD-10-CM | POA: Diagnosis not present

## 2016-03-02 DIAGNOSIS — J9611 Chronic respiratory failure with hypoxia: Secondary | ICD-10-CM | POA: Diagnosis not present

## 2016-03-02 DIAGNOSIS — S2232XA Fracture of one rib, left side, initial encounter for closed fracture: Secondary | ICD-10-CM | POA: Diagnosis not present

## 2016-03-02 DIAGNOSIS — Y999 Unspecified external cause status: Secondary | ICD-10-CM | POA: Insufficient documentation

## 2016-03-02 DIAGNOSIS — Y929 Unspecified place or not applicable: Secondary | ICD-10-CM | POA: Diagnosis not present

## 2016-03-02 DIAGNOSIS — A419 Sepsis, unspecified organism: Secondary | ICD-10-CM | POA: Diagnosis not present

## 2016-03-02 DIAGNOSIS — R0682 Tachypnea, not elsewhere classified: Secondary | ICD-10-CM | POA: Diagnosis not present

## 2016-03-02 DIAGNOSIS — J45909 Unspecified asthma, uncomplicated: Secondary | ICD-10-CM | POA: Diagnosis not present

## 2016-03-02 DIAGNOSIS — R0781 Pleurodynia: Secondary | ICD-10-CM | POA: Diagnosis not present

## 2016-03-02 DIAGNOSIS — R0902 Hypoxemia: Secondary | ICD-10-CM | POA: Diagnosis not present

## 2016-03-02 DIAGNOSIS — J449 Chronic obstructive pulmonary disease, unspecified: Secondary | ICD-10-CM | POA: Diagnosis not present

## 2016-03-02 DIAGNOSIS — I10 Essential (primary) hypertension: Secondary | ICD-10-CM | POA: Insufficient documentation

## 2016-03-02 DIAGNOSIS — R51 Headache: Secondary | ICD-10-CM | POA: Diagnosis not present

## 2016-03-02 MED ORDER — HYDROCODONE-ACETAMINOPHEN 5-325 MG PO TABS: 1.0000 | ORAL_TABLET | Freq: Four times a day (QID) | ORAL | 0 refills | Status: DC | PRN

## 2016-03-02 MED ORDER — ALBUTEROL SULFATE (2.5 MG/3ML) 0.083% IN NEBU
5.0000 mg | INHALATION_SOLUTION | Freq: Once | RESPIRATORY_TRACT | Status: AC
  Administered 2016-03-02: 5 mg via RESPIRATORY_TRACT
  Filled 2016-03-02: qty 6

## 2016-03-02 MED ORDER — ACETAMINOPHEN 500 MG PO TABS
1000.0000 mg | ORAL_TABLET | Freq: Once | ORAL | Status: AC
  Administered 2016-03-02: 1000 mg via ORAL
  Filled 2016-03-02: qty 2

## 2016-03-02 NOTE — ED Provider Notes (Signed)
[facility] DEPT Provider Note   CSN: [id number] Arrival date & time: 03/02/16  1431     History   Chief Complaint Chief Complaint  Patient presents with  . Rib Injury    HPI Zamarian Kwak Arntzen is a 75 y.o. male.  HPI Patient presents with concern of pain in the left lateral rib cage, as well as difficulty secondary to pain in this area. Patient had a mechanical fall yesterday, recalls the entire event. However, since the fall he has had persistent soreness throughout the left inferior anterior lateral axilla. Associated dyspnea with typical inspiration. No fever, no cough, no other pain, no head trauma.   Past Medical History:  Diagnosis Date  . Anxiety 03/13/2012  . Asthma   . 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  Marland Kitchen BPH (benign prostatic hyperplasia) 10/01/2011   Per cystoscopy  . Chronic anxiety   . Chronic back pain   . COPD (chronic obstructive pulmonary disease) ([place])   . Depression   . Emphysema lung ([place])   . GERD (gastroesophageal reflux disease)   . Headache(784.0)   . History of stomach ulcers   . Hypercholesteremia   . Hyperlipidemia 03/13/2012  . On home O2    2L N/C continuous  . Pulmonary nodule 09/2011   Stable appearance  . Tobacco abuse 09/29/2011    Patient Active Problem List   Diagnosis Date Noted  . Shortness of breath   . Acute on chronic respiratory failure ([place]) 10/24/2015  . Pressure ulcer 09/25/2015  . Respiratory failure ([place]) 09/24/2015  . Acute on chronic respiratory failure with hypercapnia ([place])   . Frequent falls 09/21/2015  . HLD (hyperlipidemia) 08/18/2015  . Chronic anemia 08/18/2015  . COPD exacerbation ([place]) 08/18/2015  . Chronic pain syndrome 05/01/2015  . Chronic respiratory failure with hypoxia ([place])   . COPD (chronic obstructive pulmonary disease) ([place]) 02/09/2014  . Hyperglycemia, drug-induced 03/26/2013  . Chronic back pain   . Anxiety 03/13/2012  . Hyperlipidemia 03/13/2012  . BPH  (benign prostatic hyperplasia) 10/01/2011  . Tobacco abuse 09/29/2011  . Bladder tumor 09/28/2011  . PULMONARY NODULE 12/27/2009  . Essential hypertension 12/26/2009  . OSTEOARTHRITIS 12/26/2009    Past Surgical History:  Procedure Laterality Date  . CARDIAC CATHETERIZATION  2003   [facility]  . CHOLECYSTECTOMY    . CYSTOSCOPY  09/30/2011   Procedure: CYSTOSCOPY FLEXIBLE;  Surgeon: Marissa Nestle, MD;  Location: [facility];  Service: Urology;  Laterality: N/A;  . FRACTURE SURGERY     R ring finger, pin placed  . TRANSURETHRAL RESECTION OF BLADDER TUMOR  10/01/2011   Procedure: TRANSURETHRAL RESECTION OF BLADDER TUMOR (TURBT);  Surgeon: Marissa Nestle, MD;  Location: [facility];  Service: Urology;  Laterality: N/A;       Home Medications    Prior to Admission medications   Medication Sig Start Date End Date Taking? Authorizing Provider  albuterol (PROVENTIL) (2.5 MG/3ML) 0.083% nebulizer solution Take 3 mLs (2.5 mg total) by nebulization every 4 (four) hours as needed. For shortness of breath 11/28/15  Yes Timmothy Euler, MD  ALPRAZolam Duanne Moron) 0.5 MG tablet Take 1 tablet (0.5 mg total) by mouth 3 (three) times daily as needed for anxiety. anxiety 12/21/15  Yes Timmothy Euler, MD  aspirin EC 81 MG tablet Take 81 mg by mouth every morning.  10/02/11  Yes Rexene Alberts, MD  citalopram (CELEXA) 20 MG tablet Take 1 tablet (20 mg total) by mouth every morning. 12/05/15  Yes  Timmothy Euler, MD  cyanocobalamin 500 MCG tablet Take 500 mcg by mouth every morning.   Yes Historical Provider, MD  DALIRESP 500 MCG TABS tablet Take 500 mcg by mouth every morning.  01/26/14  Yes Historical Provider, MD  docusate sodium (COLACE) 100 MG capsule Take 100 mg by mouth daily as needed for mild constipation or moderate constipation.    Yes Historical Provider, MD  DULoxetine (CYMBALTA) 60 MG capsule Take 1 capsule (60 mg total) by mouth daily. 02/21/16  Yes Timmothy Euler, MD  esomeprazole (NEXIUM) 40 MG  capsule Take 40 mg by mouth every morning.   Yes Historical Provider, MD  fluticasone furoate-vilanterol (BREO ELLIPTA) 200-25 MCG/INH AEPB Inhale 1 puff into the lungs daily. 01/01/16  Yes Sharion Balloon, FNP  loratadine (CLARITIN) 10 MG tablet Take 10 mg by mouth every morning.    Yes Historical Provider, MD  montelukast (SINGULAIR) 10 MG tablet Take 1 tablet (10 mg total) by mouth at bedtime. 12/15/15  Yes Timmothy Euler, MD  Multiple Vitamin (MULTIVITAMIN WITH MINERALS) TABS tablet Take 1 tablet by mouth daily.   Yes Historical Provider, MD  Omega-3 Fatty Acids (FISH OIL) 1000 MG CAPS Take 3,000 mg by mouth every morning.   Yes Historical Provider, MD  simvastatin (ZOCOR) 40 MG tablet Take 40 mg by mouth at bedtime.    Yes Historical Provider, MD  umeclidinium bromide (INCRUSE ELLIPTA) 62.5 MCG/INH AEPB Inhale 1 puff into the lungs daily. 01/01/16  Yes Sharion Balloon, FNP  VENTOLIN HFA 108 (90 Base) MCG/ACT inhaler Inhale 2 puffs into the lungs every 6 (six) hours as needed for wheezing or shortness of breath. 12/14/15  Yes Timmothy Euler, MD  atorvastatin (LIPITOR) 20 MG tablet Take 1 tablet by mouth daily. 03/02/16   Historical Provider, MD  NITROSTAT 0.4 MG SL tablet Place 1 tablet under the tongue every 5 (five) minutes as needed for chest pain.  02/22/13   Historical Provider, MD    Family History Family History  Problem Relation Age of Onset  . Kidney failure Mother   . Emphysema Brother     was not close to family; does not truly know their medical problems.  . Emphysema Brother   . Emphysema Brother   . Cancer Neg Hx     Social History Social History  Substance Use Topics  . Smoking status: Current Every Day Smoker    Packs/day: 0.50    Years: 58.00    Types: Cigarettes  . Smokeless tobacco: Never Used  . Alcohol use No     Allergies   Iohexol and Ivp dye [iodinated diagnostic agents]   Review of Systems Review of Systems  Constitutional:       Per HPI,  otherwise negative  HENT:       Per HPI, otherwise negative  Respiratory:       Per HPI, otherwise negative  Cardiovascular:       Per HPI, otherwise negative  Gastrointestinal: Negative for vomiting.  Endocrine:       Negative aside from HPI  Genitourinary:       Neg aside from HPI   Musculoskeletal:       Per HPI, otherwise negative  Skin: Negative.  Negative for wound.  Neurological: Negative for syncope.     Physical Exam Updated Vital Signs BP 100/63 (BP Location: Left Arm)   Pulse 97   Temp 98.5 F (36.9 C) (Oral)   Resp 18   Ht 5'  5" (1.651 m)   Wt 165 lb (74.8 kg)   SpO2 94%   BMI 27.46 kg/m   Physical Exam  Constitutional: He is oriented to person, place, and time. He appears well-developed.  Uncomfortable appearing elderly male  HENT:  Head: Normocephalic and atraumatic.  Eyes: Conjunctivae and EOM are normal.  Cardiovascular: Normal rate and regular rhythm.   Pulmonary/Chest: Accessory muscle usage present. No stridor. Tachypnea noted. He has decreased breath sounds. He has wheezes.    Abdominal: He exhibits no distension.  Musculoskeletal: He exhibits no edema.  Neurological: He is alert and oriented to person, place, and time.  Skin: Skin is warm and dry.  Psychiatric: He has a normal mood and affect.  Nursing note and vitals reviewed.    ED Treatments / Results   Radiology Dg Ribs Unilateral W/chest Left  Result Date: 03/02/2016 CLINICAL DATA:  Left rib pain following fall yesterday. Initial encounter. EXAM: LEFT RIBS AND CHEST - 3+ VIEW COMPARISON:  10/24/2015 and prior chest radiographs FINDINGS: The cardiomediastinal silhouette is unremarkable. Interstitial opacities appears increased since the prior study and may represent mild interstitial edema. There is no evidence of airspace disease, pleural effusion or pneumothorax. No acute bony abnormalities. No left rib abnormalities are identified. IMPRESSION: No evidence of acute rib fracture.  Interstitial opacities which appear increased since the prior study. This is nonspecific but may represent mild interstitial edema. Electronically Signed   By: Margarette Canada M.D.   On: 03/02/2016 15:34   Ct Chest Wo Contrast  Result Date: 03/02/2016 CLINICAL DATA:  75 year old male with left chest and rib pain following fall. EXAM: CT CHEST WITHOUT CONTRAST TECHNIQUE: Multidetector CT imaging of the chest was performed following the standard protocol without IV contrast. COMPARISON:  11/06/2013 and 06/21/2010 chest CTs FINDINGS: Cardiovascular: Coronary artery calcifications again noted. There is no evidence of thoracic aortic aneurysm or pericardial effusion. Mediastinum/Nodes: No enlarged mediastinal or axillary lymph nodes. Thyroid gland, trachea, and esophagus demonstrate no significant findings. Lungs/Pleura: Moderate centrilobular emphysema again identified. Multiple pulmonary nodules bilaterally again identified. A new 6 mm right middle lobe nodule (image 98) is identified. No airspace disease, consolidation, pulmonary mass, pleural effusion or pneumothorax identified. Mild right lower lobe atelectasis identified. Upper Abdomen: No acute abnormality. Musculoskeletal: An acute fracture of the posterior left eleventh rib is identified. No other acute fracture noted. IMPRESSION: Acute fracture of the posterior left eleventh rib - no evidence of pneumothorax or pleural effusion. New 6 mm right middle lobe nodule since 2015. Non-contrast chest CT at 6-12 months is recommended. If the nodule is stable at time of repeat CT, then future CT at 18-24 months (from today's scan) is considered optional for low-risk patients, but is recommended for high-risk patients. This recommendation follows the consensus statement: Guidelines for Management of Incidental Pulmonary Nodules Detected on CT Images: From the Fleischner Society 2017; Radiology 2017; 284:228-243. Emphysema. Electronically Signed   By: Margarette Canada M.D.    On: 03/02/2016 16:53   Initial x-rays reviewed with patient and his daughter. With concern for fracture, patient will have CT scan, and with concern for his dyspnea, wheezing, and his history of smoking, patient will receive albuterol.  Procedures Procedures (including critical care time)  Medications Ordered in ED Medications  albuterol (PROVENTIL) (2.5 MG/3ML) 0.083% nebulizer solution 5 mg (5 mg Nebulization Given 03/02/16 1609)  acetaminophen (TYLENOL) tablet 1,000 mg (1,000 mg Oral Given 03/02/16 1608)     Initial Impression / Assessment and Plan / ED Course  I have reviewed the triage vital signs and the nursing notes.  Pertinent labs & imaging results that were available during my care of the patient were reviewed by me and considered in my medical decision making (see chart for details).  Clinical Course    Patient presents a fall occurred earlier today. Here he is awake and alert, though he has some discomfort with palpation about the left lateral chest wall. Patient is not hypoxic, and distress, but does have audible wheezing bilaterally. This improves after albuterol therapy. Without evidence for pneumonia, pneumothorax, the patient prepared for discharge with outpatient follow-up. Patient provided incentive spirometer, analgesia.   Carmin Muskrat, MD 03/02/16 [phone]

## 2016-03-02 NOTE — ED Notes (Signed)
Pt rates his rib pain 10/10. Pt medicated

## 2016-03-02 NOTE — Discharge Instructions (Signed)
As discussed, with your newly diagnosed broken rib it is important that you monitor your condition carefully, use the provided incentive spirometer, 4 times daily, and be sure to follow-up with your primary care physician.  Return here for concerning changes in your condition.

## 2016-03-02 NOTE — ED Notes (Signed)
Patient transported to X-ray 

## 2016-03-02 NOTE — ED Notes (Signed)
Return from xray

## 2016-03-02 NOTE — ED Notes (Signed)
Pt waiting to be instructed on incentive spirometry. Given Rx and discharge instructions to pt and family

## 2016-03-02 NOTE — ED Notes (Signed)
Pt return from CT.

## 2016-03-02 NOTE — ED Triage Notes (Signed)
Pt states he tripped and fell over his oxygen tubing last night. C/o pain to left rib area. Denies cp/sob. nad noted.

## 2016-03-19 ENCOUNTER — Other Ambulatory Visit: Payer: Self-pay | Admitting: Family Medicine

## 2016-03-19 NOTE — Telephone Encounter (Signed)
Patient last seen 01/15/2016, please advise and route to pools

## 2016-03-20 NOTE — Telephone Encounter (Signed)
Number not a working number

## 2016-03-26 DIAGNOSIS — J449 Chronic obstructive pulmonary disease, unspecified: Secondary | ICD-10-CM | POA: Diagnosis not present

## 2016-03-28 ENCOUNTER — Other Ambulatory Visit: Payer: Self-pay | Admitting: Family Medicine

## 2016-04-01 DIAGNOSIS — A419 Sepsis, unspecified organism: Secondary | ICD-10-CM | POA: Diagnosis not present

## 2016-04-01 DIAGNOSIS — J449 Chronic obstructive pulmonary disease, unspecified: Secondary | ICD-10-CM | POA: Diagnosis not present

## 2016-04-01 DIAGNOSIS — J9611 Chronic respiratory failure with hypoxia: Secondary | ICD-10-CM | POA: Diagnosis not present

## 2016-04-01 DIAGNOSIS — R51 Headache: Secondary | ICD-10-CM | POA: Diagnosis not present

## 2016-04-02 DIAGNOSIS — F13230 Sedative, hypnotic or anxiolytic dependence with withdrawal, uncomplicated: Secondary | ICD-10-CM | POA: Diagnosis not present

## 2016-04-05 DIAGNOSIS — F17200 Nicotine dependence, unspecified, uncomplicated: Secondary | ICD-10-CM | POA: Diagnosis not present

## 2016-04-05 DIAGNOSIS — C678 Malignant neoplasm of overlapping sites of bladder: Secondary | ICD-10-CM | POA: Diagnosis not present

## 2016-04-05 DIAGNOSIS — C679 Malignant neoplasm of bladder, unspecified: Secondary | ICD-10-CM | POA: Diagnosis not present

## 2016-04-05 DIAGNOSIS — J9611 Chronic respiratory failure with hypoxia: Secondary | ICD-10-CM | POA: Diagnosis not present

## 2016-04-05 DIAGNOSIS — F419 Anxiety disorder, unspecified: Secondary | ICD-10-CM | POA: Diagnosis not present

## 2016-04-05 DIAGNOSIS — I2510 Atherosclerotic heart disease of native coronary artery without angina pectoris: Secondary | ICD-10-CM | POA: Diagnosis not present

## 2016-04-05 DIAGNOSIS — J449 Chronic obstructive pulmonary disease, unspecified: Secondary | ICD-10-CM | POA: Diagnosis not present

## 2016-04-05 DIAGNOSIS — E785 Hyperlipidemia, unspecified: Secondary | ICD-10-CM | POA: Diagnosis not present

## 2016-04-05 DIAGNOSIS — K219 Gastro-esophageal reflux disease without esophagitis: Secondary | ICD-10-CM | POA: Diagnosis not present

## 2016-04-05 DIAGNOSIS — M1990 Unspecified osteoarthritis, unspecified site: Secondary | ICD-10-CM | POA: Diagnosis not present

## 2016-04-23 ENCOUNTER — Telehealth: Payer: Self-pay

## 2016-04-25 NOTE — Telephone Encounter (Signed)
x

## 2016-04-26 DIAGNOSIS — J449 Chronic obstructive pulmonary disease, unspecified: Secondary | ICD-10-CM | POA: Diagnosis not present

## 2016-05-02 DIAGNOSIS — R51 Headache: Secondary | ICD-10-CM | POA: Diagnosis not present

## 2016-05-02 DIAGNOSIS — J449 Chronic obstructive pulmonary disease, unspecified: Secondary | ICD-10-CM | POA: Diagnosis not present

## 2016-05-02 DIAGNOSIS — J9611 Chronic respiratory failure with hypoxia: Secondary | ICD-10-CM | POA: Diagnosis not present

## 2016-05-02 DIAGNOSIS — A419 Sepsis, unspecified organism: Secondary | ICD-10-CM | POA: Diagnosis not present

## 2016-05-03 DIAGNOSIS — R319 Hematuria, unspecified: Secondary | ICD-10-CM | POA: Diagnosis not present

## 2016-05-03 DIAGNOSIS — N390 Urinary tract infection, site not specified: Secondary | ICD-10-CM | POA: Diagnosis not present

## 2016-05-07 ENCOUNTER — Other Ambulatory Visit: Payer: Self-pay | Admitting: Physician Assistant

## 2016-05-08 DIAGNOSIS — R918 Other nonspecific abnormal finding of lung field: Secondary | ICD-10-CM | POA: Diagnosis not present

## 2016-05-08 DIAGNOSIS — I2510 Atherosclerotic heart disease of native coronary artery without angina pectoris: Secondary | ICD-10-CM | POA: Diagnosis not present

## 2016-05-08 DIAGNOSIS — R310 Gross hematuria: Secondary | ICD-10-CM | POA: Diagnosis not present

## 2016-05-08 DIAGNOSIS — D494 Neoplasm of unspecified behavior of bladder: Secondary | ICD-10-CM | POA: Diagnosis not present

## 2016-05-08 DIAGNOSIS — J449 Chronic obstructive pulmonary disease, unspecified: Secondary | ICD-10-CM | POA: Diagnosis not present

## 2016-05-08 DIAGNOSIS — Z9981 Dependence on supplemental oxygen: Secondary | ICD-10-CM | POA: Diagnosis not present

## 2016-05-08 DIAGNOSIS — K219 Gastro-esophageal reflux disease without esophagitis: Secondary | ICD-10-CM | POA: Diagnosis not present

## 2016-05-08 DIAGNOSIS — C679 Malignant neoplasm of bladder, unspecified: Secondary | ICD-10-CM | POA: Diagnosis not present

## 2016-05-09 ENCOUNTER — Other Ambulatory Visit: Payer: Self-pay | Admitting: Family Medicine

## 2016-05-16 DIAGNOSIS — J440 Chronic obstructive pulmonary disease with acute lower respiratory infection: Secondary | ICD-10-CM | POA: Diagnosis not present

## 2016-05-16 DIAGNOSIS — E782 Mixed hyperlipidemia: Secondary | ICD-10-CM | POA: Diagnosis not present

## 2016-05-16 DIAGNOSIS — Z720 Tobacco use: Secondary | ICD-10-CM | POA: Diagnosis not present

## 2016-05-16 DIAGNOSIS — F411 Generalized anxiety disorder: Secondary | ICD-10-CM | POA: Diagnosis not present

## 2016-05-16 DIAGNOSIS — C678 Malignant neoplasm of overlapping sites of bladder: Secondary | ICD-10-CM | POA: Diagnosis not present

## 2016-05-16 DIAGNOSIS — J9611 Chronic respiratory failure with hypoxia: Secondary | ICD-10-CM | POA: Diagnosis not present

## 2016-05-16 DIAGNOSIS — I2510 Atherosclerotic heart disease of native coronary artery without angina pectoris: Secondary | ICD-10-CM | POA: Diagnosis not present

## 2016-05-27 DIAGNOSIS — J449 Chronic obstructive pulmonary disease, unspecified: Secondary | ICD-10-CM | POA: Diagnosis not present

## 2016-06-02 DIAGNOSIS — R51 Headache: Secondary | ICD-10-CM | POA: Diagnosis not present

## 2016-06-02 DIAGNOSIS — A419 Sepsis, unspecified organism: Secondary | ICD-10-CM | POA: Diagnosis not present

## 2016-06-02 DIAGNOSIS — J449 Chronic obstructive pulmonary disease, unspecified: Secondary | ICD-10-CM | POA: Diagnosis not present

## 2016-06-02 DIAGNOSIS — J9611 Chronic respiratory failure with hypoxia: Secondary | ICD-10-CM | POA: Diagnosis not present

## 2016-06-06 DIAGNOSIS — I739 Peripheral vascular disease, unspecified: Secondary | ICD-10-CM | POA: Diagnosis not present

## 2016-06-06 DIAGNOSIS — L851 Acquired keratosis [keratoderma] palmaris et plantaris: Secondary | ICD-10-CM | POA: Diagnosis not present

## 2016-06-06 DIAGNOSIS — M79673 Pain in unspecified foot: Secondary | ICD-10-CM | POA: Diagnosis not present

## 2016-06-06 DIAGNOSIS — B351 Tinea unguium: Secondary | ICD-10-CM | POA: Diagnosis not present

## 2016-06-18 DIAGNOSIS — Z9981 Dependence on supplemental oxygen: Secondary | ICD-10-CM | POA: Diagnosis not present

## 2016-06-18 DIAGNOSIS — F418 Other specified anxiety disorders: Secondary | ICD-10-CM | POA: Diagnosis not present

## 2016-06-18 DIAGNOSIS — J9611 Chronic respiratory failure with hypoxia: Secondary | ICD-10-CM | POA: Diagnosis not present

## 2016-06-18 DIAGNOSIS — F17200 Nicotine dependence, unspecified, uncomplicated: Secondary | ICD-10-CM | POA: Diagnosis not present

## 2016-06-18 DIAGNOSIS — Z01818 Encounter for other preprocedural examination: Secondary | ICD-10-CM | POA: Diagnosis not present

## 2016-06-18 DIAGNOSIS — J449 Chronic obstructive pulmonary disease, unspecified: Secondary | ICD-10-CM | POA: Diagnosis not present

## 2016-06-18 DIAGNOSIS — R54 Age-related physical debility: Secondary | ICD-10-CM | POA: Diagnosis not present

## 2016-06-18 DIAGNOSIS — I2510 Atherosclerotic heart disease of native coronary artery without angina pectoris: Secondary | ICD-10-CM | POA: Diagnosis not present

## 2016-06-18 DIAGNOSIS — D494 Neoplasm of unspecified behavior of bladder: Secondary | ICD-10-CM | POA: Diagnosis not present

## 2016-06-21 DIAGNOSIS — N400 Enlarged prostate without lower urinary tract symptoms: Secondary | ICD-10-CM | POA: Diagnosis not present

## 2016-06-21 DIAGNOSIS — Z8551 Personal history of malignant neoplasm of bladder: Secondary | ICD-10-CM | POA: Diagnosis not present

## 2016-06-21 DIAGNOSIS — I2510 Atherosclerotic heart disease of native coronary artery without angina pectoris: Secondary | ICD-10-CM | POA: Diagnosis not present

## 2016-06-21 DIAGNOSIS — K219 Gastro-esophageal reflux disease without esophagitis: Secondary | ICD-10-CM | POA: Diagnosis not present

## 2016-06-21 DIAGNOSIS — F17210 Nicotine dependence, cigarettes, uncomplicated: Secondary | ICD-10-CM | POA: Diagnosis not present

## 2016-06-21 DIAGNOSIS — J9611 Chronic respiratory failure with hypoxia: Secondary | ICD-10-CM | POA: Diagnosis not present

## 2016-06-21 DIAGNOSIS — N3289 Other specified disorders of bladder: Secondary | ICD-10-CM | POA: Diagnosis not present

## 2016-06-21 DIAGNOSIS — C679 Malignant neoplasm of bladder, unspecified: Secondary | ICD-10-CM | POA: Diagnosis not present

## 2016-06-21 DIAGNOSIS — C671 Malignant neoplasm of dome of bladder: Secondary | ICD-10-CM | POA: Diagnosis not present

## 2016-06-21 DIAGNOSIS — F329 Major depressive disorder, single episode, unspecified: Secondary | ICD-10-CM | POA: Diagnosis not present

## 2016-06-21 DIAGNOSIS — R319 Hematuria, unspecified: Secondary | ICD-10-CM | POA: Diagnosis not present

## 2016-06-24 DIAGNOSIS — J449 Chronic obstructive pulmonary disease, unspecified: Secondary | ICD-10-CM | POA: Diagnosis not present

## 2016-06-26 DIAGNOSIS — J440 Chronic obstructive pulmonary disease with acute lower respiratory infection: Secondary | ICD-10-CM | POA: Diagnosis not present

## 2016-06-26 DIAGNOSIS — Z720 Tobacco use: Secondary | ICD-10-CM | POA: Diagnosis not present

## 2016-06-26 DIAGNOSIS — C678 Malignant neoplasm of overlapping sites of bladder: Secondary | ICD-10-CM | POA: Diagnosis not present

## 2016-06-28 DIAGNOSIS — J449 Chronic obstructive pulmonary disease, unspecified: Secondary | ICD-10-CM | POA: Diagnosis not present

## 2016-06-30 DIAGNOSIS — R51 Headache: Secondary | ICD-10-CM | POA: Diagnosis not present

## 2016-06-30 DIAGNOSIS — J9611 Chronic respiratory failure with hypoxia: Secondary | ICD-10-CM | POA: Diagnosis not present

## 2016-06-30 DIAGNOSIS — A419 Sepsis, unspecified organism: Secondary | ICD-10-CM | POA: Diagnosis not present

## 2016-06-30 DIAGNOSIS — J449 Chronic obstructive pulmonary disease, unspecified: Secondary | ICD-10-CM | POA: Diagnosis not present

## 2016-08-17 IMAGING — CR DG CHEST 1V
1 series · 1 of 1 positions shown · non-contrast
Comparison: Chest radiograph performed 08/18/2015

CLINICAL DATA: Acute onset of generalized weakness and falls.
Initial encounter.

EXAM:
CHEST 1 VIEW

[ap]
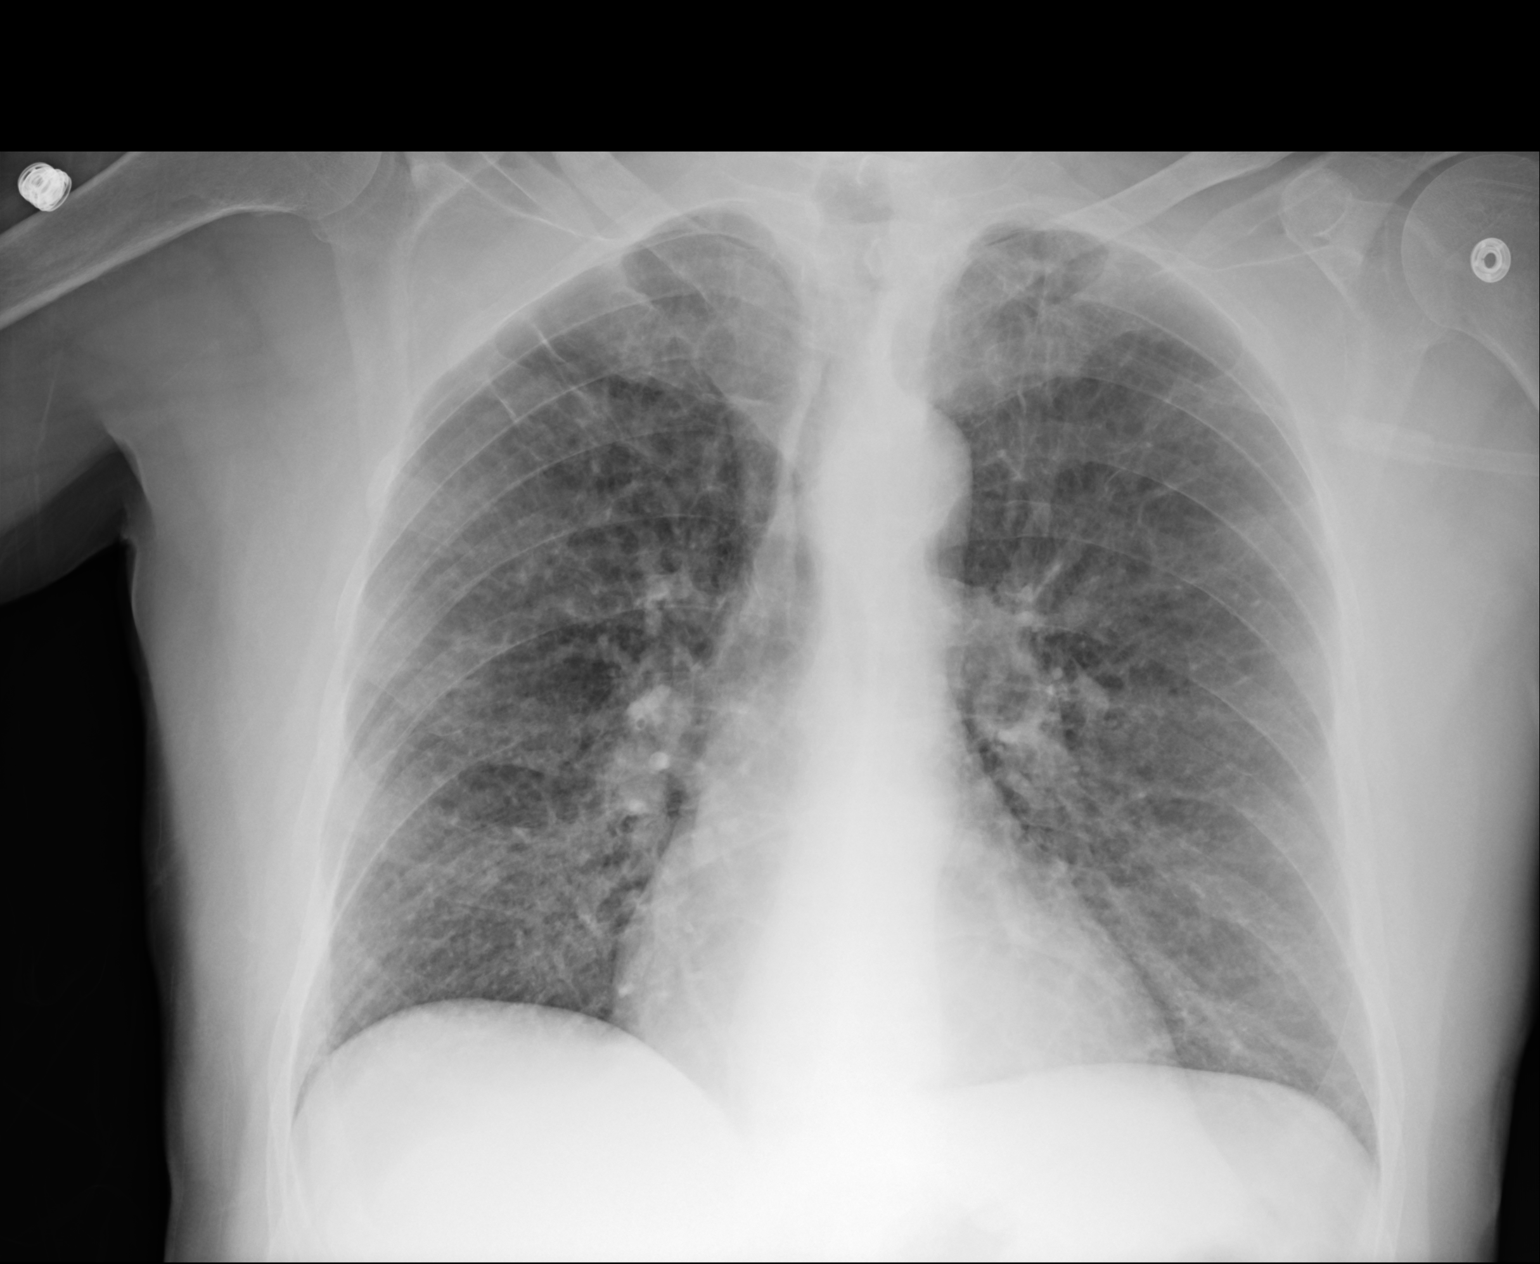

[1 of 1 positions shown; findings below may reference images not displayed]

FINDINGS: The lungs are well-aerated. Vascular congestion is noted. Mildly
increased interstitial markings may reflect mild interstitial edema.
There is no evidence of pleural effusion or pneumothorax.

The cardiomediastinal silhouette is within normal limits. No acute
osseous abnormalities are seen.
IMPRESSION: Vascular congestion noted. Mildly increased interstitial markings
may reflect mild interstitial edema.

## 2016-08-17 IMAGING — CT CT HEAD W/O CM
4 series · 16 of 47 positions shown, 18 images · non-contrast
Comparison: Images of the head from the PET/CT performed 01/09/2010

CLINICAL DATA: Acute onset of generalized weakness and falls.
Initial encounter.

EXAM:
CT HEAD WITHOUT CONTRAST
TECHNIQUE: Contiguous axial images were obtained from the base of the skull
through the vertex without intravenous contrast.

[Series 2: head w/o · axial · non-contrast · 0.40mm/px · z∈[+48,+168]mm · 8 of 40 slices shown, 10 images]
[im 5/40  brain]
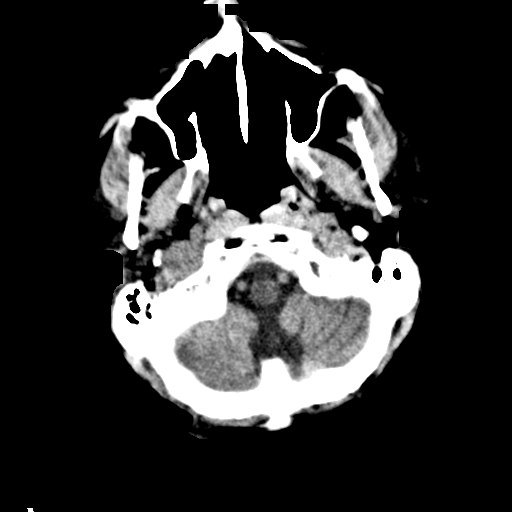
[im 5/40  bone]
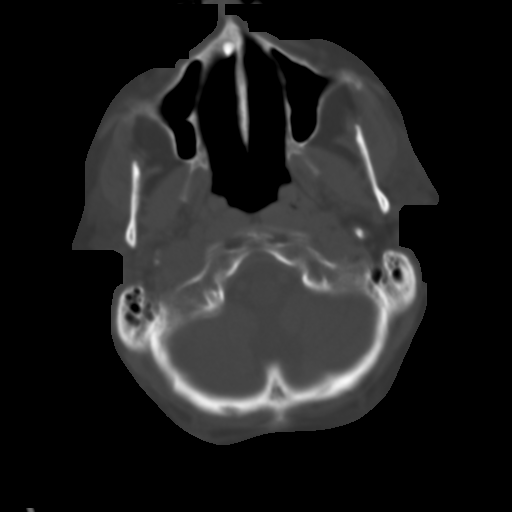
[im 9/40  brain]
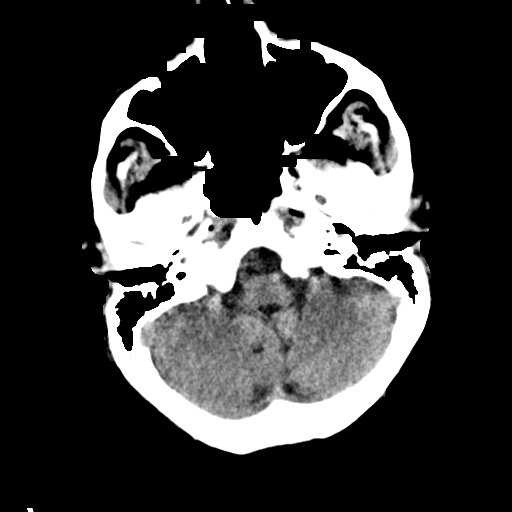
[im 14/40  brain]
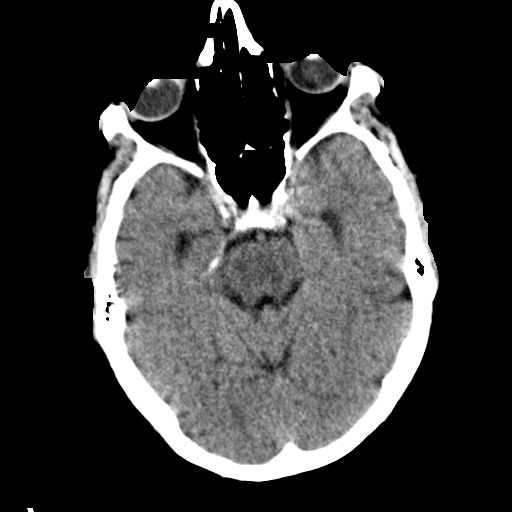
[im 18/40  brain]
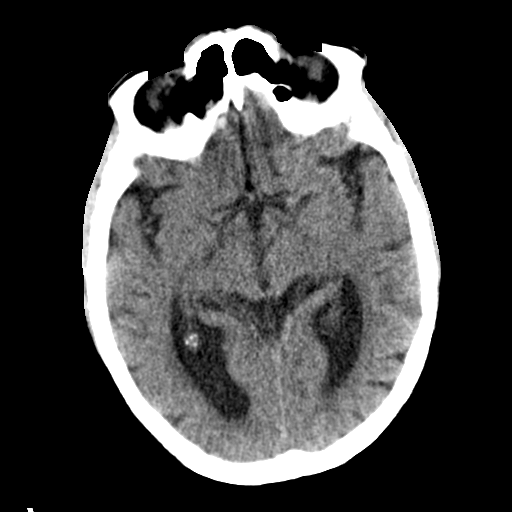
[im 22/40  brain]
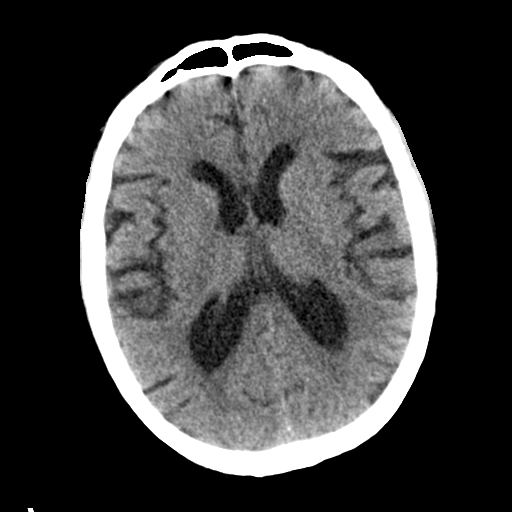
[im 22/40  bone]
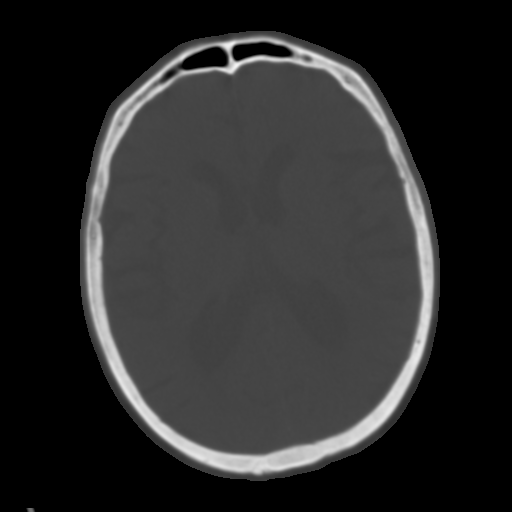
[im 27/40  brain]
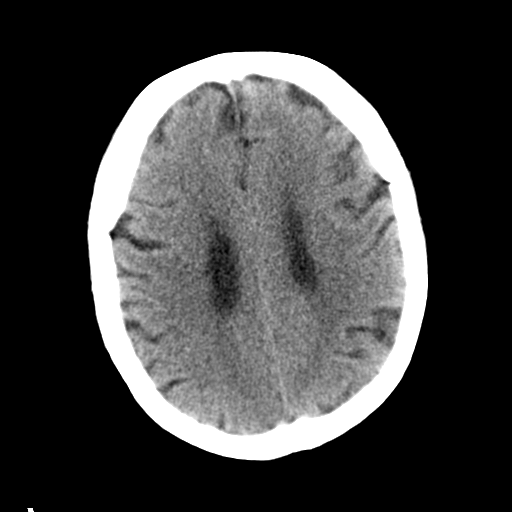
[im 31/40  brain]
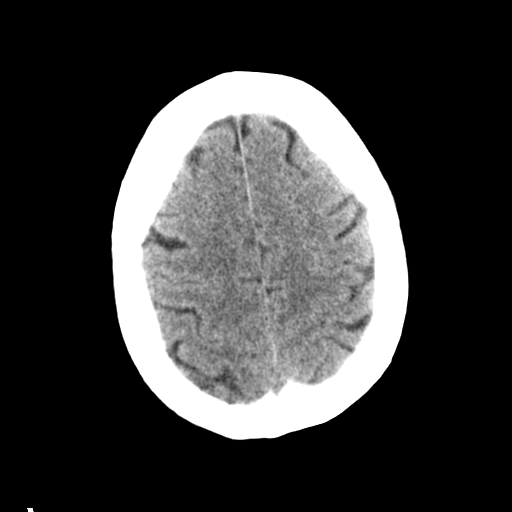
[im 35/40  brain]
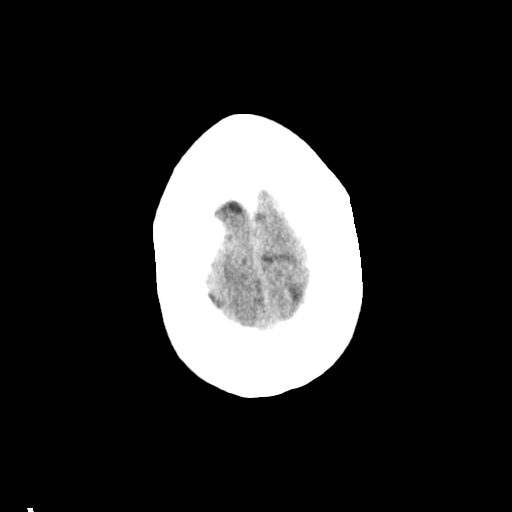

[Series 3: head bone · axial · 0.40mm/px · z∈[+48,+64]mm · 2 of 79 slices shown]
[im 9/79  bone]
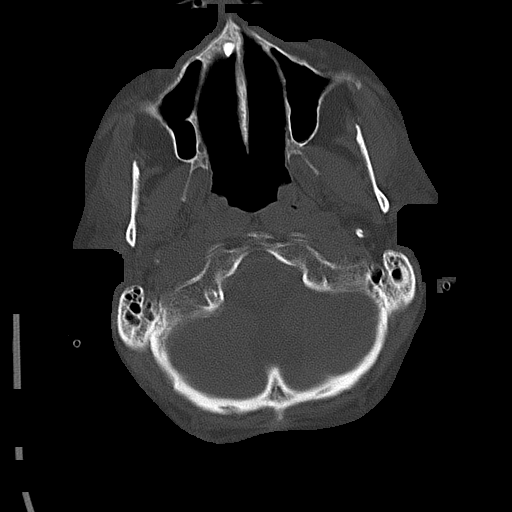
[im 17/79  bone]
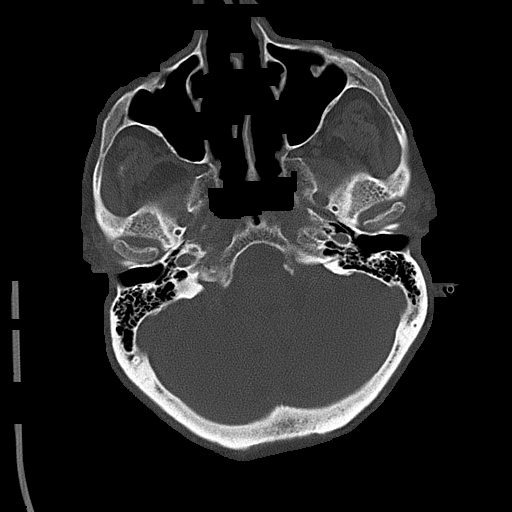

[Series 4: coronal · coronal · 0.37mm/px · 3 of 74 slices shown]
[im 25/74  brain]
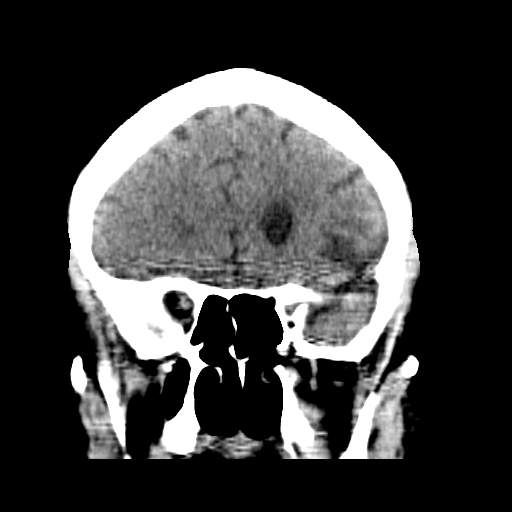
[im 33/74  brain]
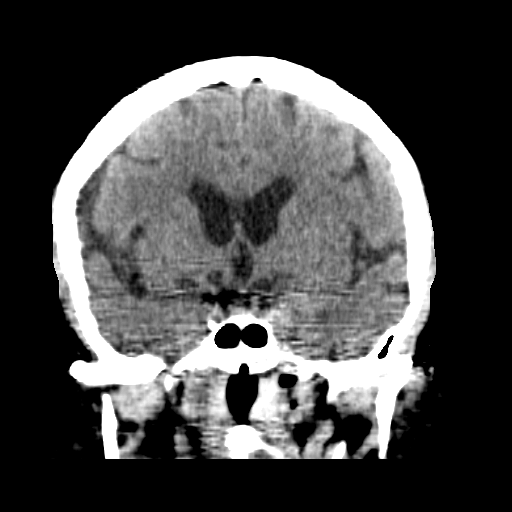
[im 41/74  brain]
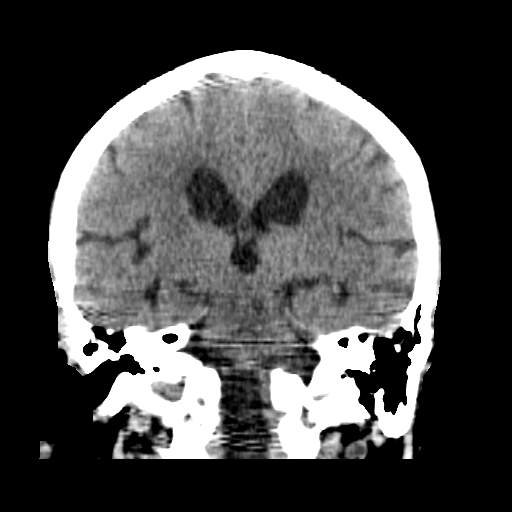

[Series 5: sagittal · sagittal · 0.34mm/px · 3 of 64 slices shown]
[im 22/64  brain]
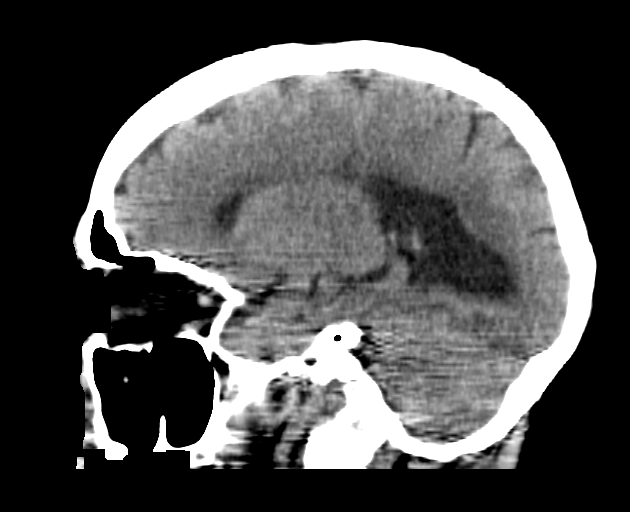
[im 32/64  brain]
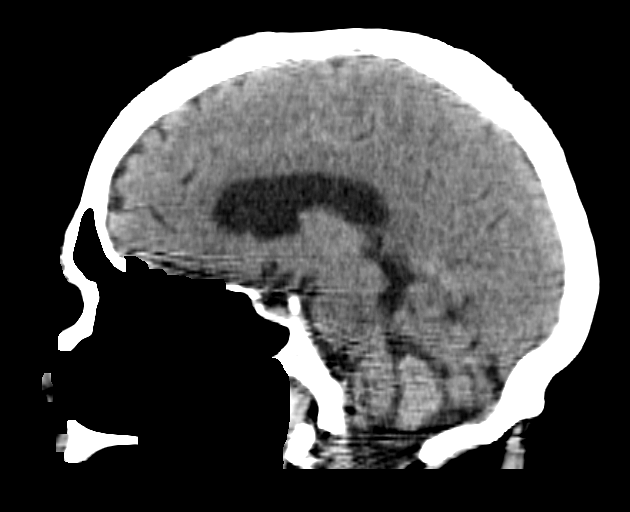
[im 43/64  brain]
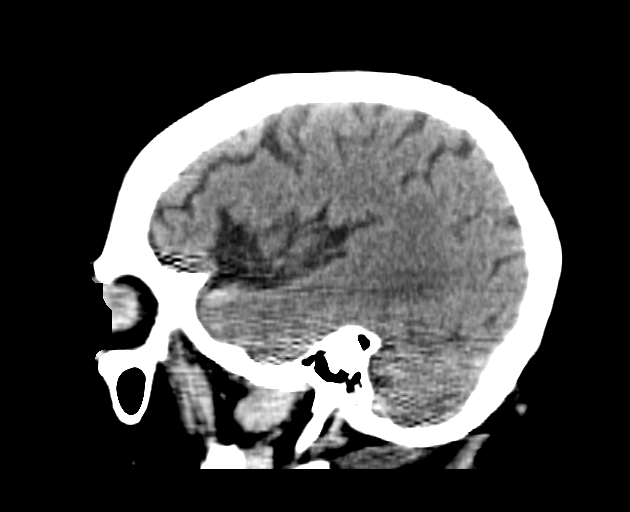

[16 of 47 positions shown; findings below may reference images not displayed]

FINDINGS: There is no evidence of acute infarction, mass lesion, or intra- or
extra-axial hemorrhage on CT.

Prominence of the ventricles and sulci reflects mild cortical volume
loss. Mild cerebellar atrophy is noted. Mild periventricular white
matter change likely reflects small vessel ischemic microangiopathy.

The brainstem and fourth ventricle are within normal limits. The
basal ganglia are unremarkable in appearance. The cerebral
hemispheres demonstrate grossly normal gray-white differentiation.
No mass effect or midline shift is seen.

There is no evidence of fracture; visualized osseous structures are
unremarkable in appearance. The orbits are within normal limits. The
paranasal sinuses and mastoid air cells are well-aerated. No
significant soft tissue abnormalities are seen.
IMPRESSION: 1. No acute intracranial pathology seen on CT.
2. Mild cortical volume loss and scattered small vessel ischemic
microangiopathy.

## 2016-11-08 ENCOUNTER — Other Ambulatory Visit: Payer: Self-pay | Admitting: Physician Assistant

## 2016-11-08 ENCOUNTER — Other Ambulatory Visit: Payer: Self-pay | Admitting: Family Medicine

## 2017-01-03 ENCOUNTER — Other Ambulatory Visit: Payer: Self-pay

## 2017-01-03 NOTE — Patient Outreach (Signed)
[facility]) Care Management  01/03/2017  [name] [name] 02/11/1941 [id number]  Telephone Screen  Referral Date: 01/02/17 Referral Source: Self/Family Referral Referral Reason: "please call dtr-Debbie Hassell Done, she is needing nursing and social work for her father" Insurance: [employer]     Outreach attempt #1 to patient/caregiver. No answer at present. RN CM left HIPAA compliant voicemail message along with contact info.    Plan: RN CM will make outreach attempt to patient/caregiver within a week if no return call.    Enzo Montgomery, RN,BSN,CCM [place] Management Telephonic Care Management Coordinator Direct Phone: [phone] Toll Free: [phone] Fax: [phone]

## 2017-01-07 ENCOUNTER — Other Ambulatory Visit: Payer: Self-pay

## 2017-01-07 NOTE — Patient Outreach (Signed)
[facility]) Care Management  01/07/2017  [name] [name] [birth year] [id number]   Telephone Screen  Referral Date: 01/02/17 Referral Source: Self/Family Referral Referral Reason: "please call dtr-Debbie Hassell Done, she is needing nursing and social work for her father" Insurance: [employer] received requesting return call to dtr. Call placed to dtr. No answer at present.       Plan: RN CM will make outreach attempt to pt/dtr within a week if no return call.     Enzo Montgomery, RN,BSN,CCM [place] Management Telephonic Care Management Coordinator Direct Phone: [phone] Toll Free: [phone] Fax: [phone]

## 2017-01-07 NOTE — Patient Outreach (Signed)
[facility]) Care Management  01/07/2017  JB DULWORTH [birth year] [id number]   Telephone Screen  Referral Date: 01/02/17 Referral Source: Self/Family Referral Referral Reason: "please call dtr-Debbie Hassell Done, she is needing nursing and social work for her father" Insurance: [employer]   Outreach attempt #2 to patient/dtr. No answer. RN CM left HIPAA compliant voicemail message along with contact info.         Plan: RN CM will make outreach attempt within one week if no return call.     Enzo Montgomery, RN,BSN,CCM [place] Management Telephonic Care Management Coordinator Direct Phone: [phone] Toll Free: [phone] Fax: [phone]

## 2017-01-08 ENCOUNTER — Other Ambulatory Visit: Payer: Self-pay

## 2017-01-08 NOTE — Patient Outreach (Signed)
[facility]) Care Management  01/08/2017  [name] [name] 28-May-1940 [id number]   Telephone Screen  Referral Date: 01/02/17 Referral Source: Self/Family Referral Referral Reason: "please call dtr-Debbie Hassell Done, she is needing nursing and social work for her father" Insurance: [employer]   Voicemail message received from dtr requesting return call. RN CM placed call to patient's dtr. Spoke with dtr who reports she "needs someone to come out and bath and help her dad clean."tr states she was told [facility] provides those services to patient. RN CM educated dtr on [place] and what we can and not do. Advised her that he do not have nursing aides on staff to provide personal care services and housework assistance. Informed her that our SWs can possibly assist and work with her to see if patient eligible for services through another agency. Upon further screening and discussion with dtr she voiced that patient no longer sees Dr. Wendi Snipes who is listed on file as PCP and [facility] network MD. She voices that patient was "kicked out" of practice due to several no shows/missed appts. She found her dad a new PCP-Dr. [name] in [place]. RN CM verified via [facility] physician directory that MD is not in [place]. Advised dtr that [facility] provides services to patients who see [facility] MDs. Informed dtr that patient was not eligible for services. RN CM suggested to dtr that she contact PCP office to see if patient eligible for [facility] services as well as to discuss possible placement options. Also, encouraged dtr to see if patient eligible for Medicaid services that would provide him additional in home support.        Plan: RN CM will notify [facility] administrative assistant of case status.   Enzo Montgomery, RN,BSN,CCM [place] Management Telephonic Care Management Coordinator Direct Phone: [phone] Toll Free: [phone] Fax: [phone]

## 2017-01-13 ENCOUNTER — Ambulatory Visit: Payer: Self-pay

## 2017-04-22 ENCOUNTER — Other Ambulatory Visit: Payer: Self-pay

## 2017-04-22 ENCOUNTER — Emergency Department (HOSPITAL_COMMUNITY): Payer: Medicare Other

## 2017-04-22 ENCOUNTER — Encounter (HOSPITAL_COMMUNITY): Payer: Self-pay | Admitting: Emergency Medicine

## 2017-04-22 ENCOUNTER — Inpatient Hospital Stay (HOSPITAL_COMMUNITY)
Admission: EM | Admit: 2017-04-22 | Discharge: 2017-04-23 | DRG: 190 | Disposition: A | Discharge status: Other Acute Inpatient Hospital | Payer: Medicare Other | Attending: Family Medicine | Admitting: Family Medicine

## 2017-04-22 DIAGNOSIS — M549 Dorsalgia, unspecified: Secondary | ICD-10-CM | POA: Diagnosis present

## 2017-04-22 DIAGNOSIS — D62 Acute posthemorrhagic anemia: Secondary | ICD-10-CM | POA: Diagnosis present

## 2017-04-22 DIAGNOSIS — D649 Anemia, unspecified: Secondary | ICD-10-CM | POA: Diagnosis present

## 2017-04-22 DIAGNOSIS — E8770 Fluid overload, unspecified: Secondary | ICD-10-CM | POA: Diagnosis not present

## 2017-04-22 DIAGNOSIS — R0602 Shortness of breath: Secondary | ICD-10-CM

## 2017-04-22 DIAGNOSIS — J441 Chronic obstructive pulmonary disease with (acute) exacerbation: Principal | ICD-10-CM | POA: Diagnosis present

## 2017-04-22 DIAGNOSIS — G8929 Other chronic pain: Secondary | ICD-10-CM | POA: Diagnosis present

## 2017-04-22 DIAGNOSIS — D509 Iron deficiency anemia, unspecified: Secondary | ICD-10-CM | POA: Diagnosis not present

## 2017-04-22 DIAGNOSIS — R296 Repeated falls: Secondary | ICD-10-CM | POA: Diagnosis present

## 2017-04-22 DIAGNOSIS — C679 Malignant neoplasm of bladder, unspecified: Secondary | ICD-10-CM | POA: Diagnosis present

## 2017-04-22 DIAGNOSIS — F419 Anxiety disorder, unspecified: Secondary | ICD-10-CM | POA: Diagnosis present

## 2017-04-22 DIAGNOSIS — Z91041 Radiographic dye allergy status: Secondary | ICD-10-CM | POA: Diagnosis not present

## 2017-04-22 DIAGNOSIS — I10 Essential (primary) hypertension: Secondary | ICD-10-CM | POA: Diagnosis present

## 2017-04-22 DIAGNOSIS — Z825 Family history of asthma and other chronic lower respiratory diseases: Secondary | ICD-10-CM

## 2017-04-22 DIAGNOSIS — Z888 Allergy status to other drugs, medicaments and biological substances status: Secondary | ICD-10-CM

## 2017-04-22 DIAGNOSIS — R310 Gross hematuria: Secondary | ICD-10-CM | POA: Diagnosis present

## 2017-04-22 DIAGNOSIS — Z79899 Other long term (current) drug therapy: Secondary | ICD-10-CM

## 2017-04-22 DIAGNOSIS — J9621 Acute and chronic respiratory failure with hypoxia: Secondary | ICD-10-CM | POA: Diagnosis present

## 2017-04-22 DIAGNOSIS — E785 Hyperlipidemia, unspecified: Secondary | ICD-10-CM | POA: Diagnosis present

## 2017-04-22 DIAGNOSIS — Z9981 Dependence on supplemental oxygen: Secondary | ICD-10-CM

## 2017-04-22 DIAGNOSIS — K219 Gastro-esophageal reflux disease without esophagitis: Secondary | ICD-10-CM | POA: Diagnosis present

## 2017-04-22 DIAGNOSIS — N400 Enlarged prostate without lower urinary tract symptoms: Secondary | ICD-10-CM | POA: Diagnosis present

## 2017-04-22 DIAGNOSIS — F17210 Nicotine dependence, cigarettes, uncomplicated: Secondary | ICD-10-CM | POA: Diagnosis present

## 2017-04-22 DIAGNOSIS — F329 Major depressive disorder, single episode, unspecified: Secondary | ICD-10-CM | POA: Diagnosis present

## 2017-04-22 DIAGNOSIS — D494 Neoplasm of unspecified behavior of bladder: Secondary | ICD-10-CM | POA: Diagnosis not present

## 2017-04-22 DIAGNOSIS — E7800 Pure hypercholesterolemia, unspecified: Secondary | ICD-10-CM | POA: Diagnosis present

## 2017-04-22 DIAGNOSIS — R0600 Dyspnea, unspecified: Secondary | ICD-10-CM

## 2017-04-22 LAB — ABO/RH: ABO/RH(D): O POS

## 2017-04-22 LAB — COMPREHENSIVE METABOLIC PANEL
ALBUMIN: 3.3 g/dL — AB (ref 3.5–5.0)
ALT: 12 U/L — AB (ref 17–63)
AST: 23 U/L (ref 15–41)
Alkaline Phosphatase: 81 U/L (ref 38–126)
Anion gap: 12 (ref 5–15)
BUN: 19 mg/dL (ref 6–20)
CO2: 32 mmol/L (ref 22–32)
Calcium: 9.1 mg/dL (ref 8.9–10.3)
Chloride: 95 mmol/L — ABNORMAL LOW (ref 101–111)
Creatinine, Ser: 1.01 mg/dL (ref 0.61–1.24)
GFR calc Af Amer: >60 mL/min (ref >60)
GFR calc non Af Amer: >60 mL/min (ref >60)
GLUCOSE: 114 mg/dL — AB (ref 65–99)
POTASSIUM: 3.8 mmol/L (ref 3.5–5.1)
SODIUM: 139 mmol/L (ref 135–145)
Total Bilirubin: 0.2 mg/dL — ABNORMAL LOW (ref 0.3–1.2)
Total Protein: 6.8 g/dL (ref 6.5–8.1)

## 2017-04-22 LAB — CBC WITH DIFFERENTIAL/PLATELET
BASOS ABS: 0.1 10*3/uL (ref 0.0–0.1)
BASOS PCT: 1 %
EOS PCT: 2 %
Eosinophils Absolute: 0.2 10*3/uL (ref 0.0–0.7)
HCT: 13.2 % — ABNORMAL LOW (ref 39.0–52.0)
Hemoglobin: 3.5 g/dL — CL (ref 13.0–17.0)
Lymphocytes Relative: 27 %
Lymphs Abs: 3.6 10*3/uL (ref 0.7–4.0)
MCH: 21.3 pg — ABNORMAL LOW (ref 26.0–34.0)
MCHC: 26.5 g/dL — ABNORMAL LOW (ref 30.0–36.0)
MCV: 80.5 fL (ref 78.0–100.0)
MONO ABS: 1.3 10*3/uL — AB (ref 0.1–1.0)
Monocytes Relative: 10 %
Neutro Abs: 8.1 10*3/uL — ABNORMAL HIGH (ref 1.7–7.7)
Neutrophils Relative %: 60 %
PLATELETS: 375 10*3/uL (ref 150–400)
RBC: 1.64 MIL/uL — AB (ref 4.22–5.81)
RDW: 19.3 % — AB (ref 11.5–15.5)
WBC: 13.3 10*3/uL — ABNORMAL HIGH (ref 4.0–10.5)

## 2017-04-22 LAB — FOLATE: Folate: 39 ng/mL (ref >5.9)

## 2017-04-22 LAB — VITAMIN B12: VITAMIN B 12: 1859 pg/mL — AB (ref 180–914)

## 2017-04-22 LAB — POC OCCULT BLOOD, ED: FECAL OCCULT BLD: NEGATIVE

## 2017-04-22 LAB — IRON AND TIBC
Iron: 6 ug/dL — ABNORMAL LOW (ref 45–182)
Saturation Ratios: 1 % — ABNORMAL LOW (ref 17.9–39.5)
TIBC: 512 ug/dL — AB (ref 250–450)
UIBC: 506 ug/dL

## 2017-04-22 LAB — FERRITIN: Ferritin: 5 ng/mL — ABNORMAL LOW (ref 24–336)

## 2017-04-22 LAB — RETICULOCYTES
RBC.: 1.65 MIL/uL — AB (ref 4.22–5.81)
RETIC CT PCT: 2.6 % (ref 0.4–3.1)
Retic Count, Absolute: 42.9 10*3/uL (ref 19.0–186.0)

## 2017-04-22 LAB — TROPONIN I

## 2017-04-22 LAB — PREPARE RBC (CROSSMATCH)

## 2017-04-22 MED ORDER — ATORVASTATIN CALCIUM 20 MG PO TABS
20.0000 mg | ORAL_TABLET | Freq: Every day | ORAL | Status: DC
  Administered 2017-04-22 – 2017-04-23 (×2): 20 mg via ORAL
  Filled 2017-04-22 (×2): qty 1

## 2017-04-22 MED ORDER — ALPRAZOLAM 0.5 MG PO TABS
0.5000 mg | ORAL_TABLET | Freq: Two times a day (BID) | ORAL | Status: DC | PRN
  Administered 2017-04-22 – 2017-04-23 (×3): 0.5 mg via ORAL
  Filled 2017-04-22 (×3): qty 1

## 2017-04-22 MED ORDER — DEXTROSE 5 % IV SOLN
500.0000 mg | INTRAVENOUS | Status: DC
  Filled 2017-04-22 (×2): qty 500

## 2017-04-22 MED ORDER — IPRATROPIUM BROMIDE 0.02 % IN SOLN
1.0000 mg | Freq: Once | RESPIRATORY_TRACT | Status: AC
  Administered 2017-04-22: 1 mg via RESPIRATORY_TRACT
  Filled 2017-04-22: qty 5

## 2017-04-22 MED ORDER — FUROSEMIDE 10 MG/ML IJ SOLN
20.0000 mg | INTRAMUSCULAR | Status: AC
  Administered 2017-04-22 (×2): 20 mg via INTRAVENOUS
  Filled 2017-04-22 (×2): qty 2

## 2017-04-22 MED ORDER — SODIUM CHLORIDE 0.9 % IV BOLUS (SEPSIS)
500.0000 mL | Freq: Once | INTRAVENOUS | Status: AC
  Administered 2017-04-22: 500 mL via INTRAVENOUS

## 2017-04-22 MED ORDER — ROFLUMILAST 500 MCG PO TABS
500.0000 ug | ORAL_TABLET | Freq: Every morning | ORAL | Status: DC
  Administered 2017-04-23: 500 ug via ORAL
  Filled 2017-04-22: qty 1

## 2017-04-22 MED ORDER — PANTOPRAZOLE SODIUM 40 MG PO TBEC
40.0000 mg | DELAYED_RELEASE_TABLET | Freq: Every day | ORAL | Status: DC
  Administered 2017-04-23: 40 mg via ORAL
  Filled 2017-04-22: qty 1

## 2017-04-22 MED ORDER — ALBUTEROL SULFATE (2.5 MG/3ML) 0.083% IN NEBU
5.0000 mg | INHALATION_SOLUTION | Freq: Once | RESPIRATORY_TRACT | Status: AC
  Administered 2017-04-22: 5 mg via RESPIRATORY_TRACT
  Filled 2017-04-22: qty 6

## 2017-04-22 MED ORDER — SODIUM CHLORIDE 0.9 % IV SOLN: Freq: Once | INTRAVENOUS | Status: DC

## 2017-04-22 MED ORDER — IPRATROPIUM-ALBUTEROL 0.5-2.5 (3) MG/3ML IN SOLN
3.0000 mL | Freq: Four times a day (QID) | RESPIRATORY_TRACT | Status: DC
  Administered 2017-04-22 – 2017-04-23 (×5): 3 mL via RESPIRATORY_TRACT
  Filled 2017-04-22 (×6): qty 3

## 2017-04-22 MED ORDER — UMECLIDINIUM BROMIDE 62.5 MCG/INH IN AEPB
1.0000 | INHALATION_SPRAY | Freq: Every day | RESPIRATORY_TRACT | Status: DC
  Filled 2017-04-22 (×2): qty 7

## 2017-04-22 MED ORDER — METHYLPREDNISOLONE SODIUM SUCC 40 MG IJ SOLR
40.0000 mg | Freq: Two times a day (BID) | INTRAMUSCULAR | Status: DC
  Administered 2017-04-23: 40 mg via INTRAVENOUS
  Filled 2017-04-22: qty 1

## 2017-04-22 MED ORDER — FLUTICASONE FUROATE-VILANTEROL 200-25 MCG/INH IN AEPB
1.0000 | INHALATION_SPRAY | Freq: Every day | RESPIRATORY_TRACT | Status: DC
  Filled 2017-04-22 (×2): qty 28

## 2017-04-22 MED ORDER — GUAIFENESIN-DM 100-10 MG/5ML PO SYRP
5.0000 mL | ORAL_SOLUTION | Freq: Four times a day (QID) | ORAL | Status: DC
  Administered 2017-04-22 – 2017-04-23 (×3): 5 mL via ORAL
  Filled 2017-04-22 (×3): qty 5

## 2017-04-22 MED ORDER — DULOXETINE HCL 60 MG PO CPEP
60.0000 mg | ORAL_CAPSULE | Freq: Every day | ORAL | Status: DC
  Administered 2017-04-23: 60 mg via ORAL
  Filled 2017-04-22: qty 1

## 2017-04-22 MED ORDER — METHYLPREDNISOLONE SODIUM SUCC 125 MG IJ SOLR
125.0000 mg | Freq: Once | INTRAMUSCULAR | Status: AC
  Administered 2017-04-22: 125 mg via INTRAVENOUS
  Filled 2017-04-22: qty 2

## 2017-04-22 MED ORDER — LORATADINE 10 MG PO TABS
10.0000 mg | ORAL_TABLET | Freq: Every morning | ORAL | Status: DC
  Administered 2017-04-23: 10 mg via ORAL
  Filled 2017-04-22: qty 1

## 2017-04-22 MED ORDER — ALBUTEROL SULFATE (2.5 MG/3ML) 0.083% IN NEBU
5.0000 mg | INHALATION_SOLUTION | Freq: Once | RESPIRATORY_TRACT | Status: AC
Start: 2017-04-22 — End: 2017-04-22
  Administered 2017-04-22: 5 mg via RESPIRATORY_TRACT
  Filled 2017-04-22: qty 6

## 2017-04-22 MED ORDER — DEXTROSE 5 % IV SOLN
500.0000 mg | Freq: Once | INTRAVENOUS | Status: AC
  Administered 2017-04-22: 500 mg via INTRAVENOUS
  Filled 2017-04-22: qty 500

## 2017-04-22 NOTE — ED Notes (Signed)
Pt.'s O2 sats are 88%, but hands are a little cold and could not get a significantly good pleth. Pt in NAD.

## 2017-04-22 NOTE — Progress Notes (Signed)
Pt keeps saying he thinks he's going to die and he's afraid to go to sleep because he doesn't feel like he's going to wake up. Pt currently receiving 2nd unit of blood. Adv pt VSS, he is breathing 28 times a minute. RT called because pt requesting breathing tx. Will continue to monitor pt.

## 2017-04-22 NOTE — ED Notes (Signed)
Pt will not keep o2 probe on finger.  Spot check sats 93%.

## 2017-04-22 NOTE — ED Provider Notes (Signed)
Emergency Department Provider Note   I have reviewed the triage vital signs and the nursing notes.   HISTORY  Chief Complaint Shortness of Breath   HPI [name] [name] is a 77 y.o. male with history of asthma, COPD and still smokes wears 3 L to 4 L at a time of oxygen at home that presents to the emergency department today secondary to worsening cough, shortness of breath and intermittent chest pain for the last few days.  Patient states this feels like previous COPD exacerbations.  He tried his breathing treatments at home without relief.  Is also decreased hearing during this time and increased upper airway congestion.  No fevers.  His cough is productive of yellow sputum that does seem to be getting worse.  Has not tried anything else for his symptoms. Apparently went to urgent care today prior to coming here.  No other associated or modifying symptoms.    Past Medical History:  Diagnosis Date  . Anxiety 03/13/2012  . Asthma   . 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  Marland Kitchen BPH (benign prostatic hyperplasia) 10/01/2011   Per cystoscopy  . Chronic anxiety   . Chronic back pain   . COPD (chronic obstructive pulmonary disease) ([place])   . Depression   . Emphysema lung ([place])   . GERD (gastroesophageal reflux disease)   . Headache(784.0)   . History of stomach ulcers   . Hypercholesteremia   . Hyperlipidemia 03/13/2012  . On home O2    2L N/C continuous  . Pulmonary nodule 09/2011   Stable appearance  . Tobacco abuse 09/29/2011    Patient Active Problem List   Diagnosis Date Noted  . Shortness of breath   . Acute on chronic respiratory failure ([place]) 10/24/2015  . Pressure ulcer 09/25/2015  . Respiratory failure ([place]) 09/24/2015  . Acute on chronic respiratory failure with hypercapnia ([place])   . Frequent falls 09/21/2015  . HLD (hyperlipidemia) 08/18/2015  . Chronic anemia 08/18/2015  . COPD exacerbation ([place]) 08/18/2015  . Chronic pain syndrome  05/01/2015  . Chronic respiratory failure with hypoxia ([place])   . COPD (chronic obstructive pulmonary disease) ([place]) 02/09/2014  . Hyperglycemia, drug-induced 03/26/2013  . Chronic back pain   . Anxiety 03/13/2012  . Hyperlipidemia 03/13/2012  . BPH (benign prostatic hyperplasia) 10/01/2011  . Tobacco abuse 09/29/2011  . Bladder tumor 09/28/2011  . PULMONARY NODULE 12/27/2009  . Essential hypertension 12/26/2009  . OSTEOARTHRITIS 12/26/2009    Past Surgical History:  Procedure Laterality Date  . CARDIAC CATHETERIZATION  2003   [facility]  . CHOLECYSTECTOMY    . CYSTOSCOPY  09/30/2011   Procedure: CYSTOSCOPY FLEXIBLE;  Surgeon: Marissa Nestle, MD;  Location: [facility];  Service: Urology;  Laterality: N/A;  . FRACTURE SURGERY     R ring finger, pin placed  . TRANSURETHRAL RESECTION OF BLADDER TUMOR  10/01/2011   Procedure: TRANSURETHRAL RESECTION OF BLADDER TUMOR (TURBT);  Surgeon: Marissa Nestle, MD;  Location: [facility];  Service: Urology;  Laterality: N/A;    Current Outpatient Rx  . Order #: [id number] Class: Historical Med  . Order #: [id number] Class: Normal  . Order #: [id number] Class: Historical Med  . Order #: [id number] Class: Historical Med  . Order #: [id number] Class: Historical Med  . Order #: [id number] Class: Historical Med  . Order #: [id number] Class: Historical Med  . Order #: [id number] Class: Historical Med  . Order #: [id number] Class: Normal  . Order #: [id number] Class: Historical Med  . Order #: [id number] Class: Normal  .  Order #: [id number] Class: Historical Med  . Order #: [id number] Class: Historical Med  . Order #: [id number] Class: Historical Med  . Order #: [id number] Class: Historical Med  . Order #: [id number] Class: Historical Med  . Order #: [id number] Class: Historical Med  . Order #: [id number] Class: Historical Med  . Order #: [id number] Class: Historical Med  . Order #: [id number] Class: Historical Med  . Order #: [id number] Class: Normal  . Order #: [id number] Class: Normal  .  Order #: [id number] Class: Phone In  . Order #: [id number] Class: Historical Med  . Order #: [id number] Class: Historical Med  . Order #: [id number] Class: Historical Med  . Order #: [id number] Class: Print  . Order #: [id number] Class: Historical Med  . Order #: [id number] Class: Historical Med  . Order #: [id number] Class: Historical Med    Allergies Metrizamide; Iohexol; and Ivp dye [iodinated diagnostic agents]  Family History  Problem Relation Age of Onset  . Kidney failure Mother   . Emphysema Brother        was not close to family; does not truly know their medical problems.  . Emphysema Brother   . Emphysema Brother   . Cancer Neg Hx     Social History Social History   Tobacco Use  . Smoking status: Current Every Day Smoker    Packs/day: 0.50    Years: 58.00    Pack years: 29.00    Types: Cigarettes  . Smokeless tobacco: Never Used  Substance Use Topics  . Alcohol use: No  . Drug use: No    Review of Systems  All other systems negative except as documented in the HPI. All pertinent positives and negatives as reviewed in the HPI. ____________________________________________   PHYSICAL EXAM:  VITAL SIGNS: ED Triage Vitals  Enc Vitals Group     BP 04/22/17 1121 (!) 106/49     Pulse Rate 04/22/17 1121 95     Resp 04/22/17 1121 (!) 22     Temp 04/22/17 1121 98.1 F (36.7 C)     Temp Source 04/22/17 1121 Oral     SpO2 04/22/17 1121 (!) 88 %    Constitutional: Alert and oriented. Well appearing and in no acute distress. Eyes: Conjunctivae are normal. PERRL. EOMI. Head: Atraumatic. Ears:  Healthy appearing ear canals and TMs bilaterally with bilateral middle ear effusions. Nose: No congestion/rhinnorhea. Mouth/Throat: Mucous membranes are moist.  Oropharynx non-erythematous. Neck: No stridor.  No meningeal signs.   Cardiovascular: Normal rate, regular rhythm. Good peripheral circulation. Grossly normal heart sounds.   Respiratory: Normal respiratory effort.  No  retractions. Lungs CTAB. Gastrointestinal: Soft and nontender. No distention.  Musculoskeletal: No lower extremity tenderness nor edema. No gross deformities of extremities. Neurologic:  Normal speech and language. No gross focal neurologic deficits are appreciated.  Skin:  Skin is warm, dry and intact. No rash noted.   ____________________________________________   LABS (all labs ordered are listed, but only abnormal results are displayed)  Labs Reviewed  CBC WITH DIFFERENTIAL/PLATELET - Abnormal; Notable for the following components:      Result Value   WBC 13.3 (*)    RBC 1.64 (*)    Hemoglobin 3.5 (*)    HCT 13.2 (*)    MCH 21.3 (*)    MCHC 26.5 (*)    RDW 19.3 (*)    Neutro Abs 8.1 (*)    Monocytes Absolute 1.3 (*)    All other components within normal limits  COMPREHENSIVE METABOLIC PANEL - Abnormal; Notable for the following components:   Chloride 95 (*)    Glucose,  Bld 114 (*)    Albumin 3.3 (*)    ALT 12 (*)    Total Bilirubin 0.2 (*)    All other components within normal limits  RETICULOCYTES - Abnormal; Notable for the following components:   RBC. 1.65 (*)    All other components within normal limits  TROPONIN I  OCCULT BLOOD X 1 CARD TO LAB, STOOL  VITAMIN B12  FOLATE  IRON AND TIBC  FERRITIN  URINALYSIS, ROUTINE W REFLEX MICROSCOPIC  I-STAT CHEM 8, ED  POC OCCULT BLOOD, ED  TYPE AND SCREEN  PREPARE RBC (CROSSMATCH)  ABO/RH   ____________________________________________  EKG   EKG Interpretation  Date/Time:  Tuesday April 22 2017 11:48:37 EST Ventricular Rate:  88 PR Interval:    QRS Duration: 73 QT Interval:  368 QTC Calculation: 446 R Axis:   86 Text Interpretation:  Sinus rhythm Borderline right axis deviation Abnormal R-wave progression, early transition No significant change since last tracing in july 2017 Confirmed by Merrily Pew [phone]) on 04/22/2017 12:43:02 PM       ____________________________________________  RADIOLOGY  Dg  Chest 2 View  Result Date: 04/22/2017 CLINICAL DATA:  COPD.  Shortness of breath. EXAM: CHEST  2 VIEW COMPARISON:  CT 03/02/2016.  Chest x-ray 03/02/2016, 01/20/2014. FINDINGS: Mediastinum hilar structures are normal. Heart size stable. Stable diffuse chronic interstitial prominence noted consistent with chronic interstitial lung disease. No pleural effusion or pneumothorax. Diffuse thoracic cage osteopenia. IMPRESSION: Diffuse chronic interstitial lung disease. No acute alveolar infiltrates identified. Electronically Signed   By: Marcello Moores  Register   On: 04/22/2017 12:22    ____________________________________________   PROCEDURES  Procedure(s) performed:   Procedures  CRITICAL CARE Performed by: Merrily Pew Total critical care time: 35 minutes Critical care time was exclusive of separately billable procedures and treating other patients. Critical care was necessary to treat or prevent imminent or life-threatening deterioration. Critical care was time spent personally by me on the following activities: development of treatment plan with patient and/or surrogate as well as nursing, discussions with consultants, evaluation of patient's response to treatment, examination of patient, obtaining history from patient or surrogate, ordering and performing treatments and interventions, ordering and review of laboratory studies, ordering and review of radiographic studies, pulse oximetry and re-evaluation of patient's condition.  ____________________________________________   INITIAL IMPRESSION / ASSESSMENT AND PLAN / ED COURSE  Suspect copd exacerbation. Will treat appropriately. May have had low oxygen earlier, but on my eval before any treatments he has an O2 above 95% consistently with tachypnea but no distress. Will start with steroids/azithro/breathing tx  Patient found to have hemoglobin of 3.5 which was verified with a repeat.  Will transfuse 3 units as he is symptomatic anemia.  Will  likely need more to get above the threshold of 7 however this will be a good starting point.  Hemoccult negative.  Patient states he does have bladder cancer has been having hematuria did not realize it was to this extent.  Will discuss with hospitalist for admission.   Pertinent labs & imaging results that were available during my care of the patient were reviewed by me and considered in my medical decision making (see chart for details).  ____________________________________________  FINAL CLINICAL IMPRESSION(S) / ED DIAGNOSES  Final diagnoses:  Anemia, unspecified type     MEDICATIONS GIVEN DURING THIS VISIT:  Medications  0.9 %  sodium chloride infusion (not administered)  albuterol (PROVENTIL) (2.5 MG/3ML) 0.083% nebulizer solution 5 mg (not administered)  albuterol (PROVENTIL) (2.5 MG/3ML) 0.083%  nebulizer solution 5 mg (5 mg Nebulization Given 04/22/17 1225)  ipratropium (ATROVENT) nebulizer solution 1 mg (1 mg Nebulization Given 04/22/17 1247)  methylPREDNISolone sodium succinate (SOLU-MEDROL) 125 mg/2 mL injection 125 mg (125 mg Intravenous Given 04/22/17 1314)  azithromycin (ZITHROMAX) 500 mg in dextrose 5 % 250 mL IVPB (0 mg Intravenous Stopped 04/22/17 1420)  sodium chloride 0.9 % bolus 500 mL (0 mLs Intravenous Stopped 04/22/17 1420)     NEW OUTPATIENT MEDICATIONS STARTED DURING THIS VISIT:  This SmartLink is deprecated. Use AVSMEDLIST instead to display the medication list for a patient.  Note:  This note was prepared with assistance of Dragon voice recognition software. Occasional wrong-word or sound-a-like substitutions may have occurred due to the inherent limitations of voice recognition software.   Merrily Pew, MD 04/22/17 [phone]

## 2017-04-22 NOTE — ED Notes (Signed)
Notified by lab blood is ready.  Primary nurse Gerald Stabs aware.

## 2017-04-22 NOTE — Progress Notes (Signed)
Pt got up to [facility] with assist d/t having to urinate. Pts oxygen saturation decreased to 80s, c/o shortness of breath. After getting back in bed, he oxygen saturation increased back to 95 on 4L O2 per [place]. Pt c/o generalized weakness and keeps mentioning how he is afraid to go to sleep d/t him thinking he will not wake up. AC Tiffany  Made aware of situation, RN will monitor pt until blood is completed and see if pt improves. VSS continue to be stable at this time.  Will continue to monitor pt

## 2017-04-22 NOTE — H&P (Addendum)
History and Physical    [name] [name] YHC:[id number] DOB: [birth year] DOA: 04/22/2017  PCP: [name], MD   Patient coming from: Home >> [facility] urgent care  Chief Complaint: SOB, Cough  HPI: [name] [name] is a 77 y.o. male with medical history significant for COPD, bladder cancer, HTN, BPH, tobacco abuse.  Patient was brought to the ED from [place] urgent care with complaints of increasing shortness of breath and worsening cough productive of whitish sputum of (per patient and daughter) 2-1/2 weeks duration.  Also reports wheezing.  At baseline patient is on 3-4 L of oxygen at home and still smokes cigarettes.  Patient reports he is on treatment for bladder cancer and is supposed to undergo surgery soon.  He denies seeing blood in his urine. He denies abdominal pain, denies vomiting, denies hematochezia or melena.  Patient denies NSAID use reports use of only Tylenol for pain.  Patient denies ever having colonoscopy.   ED Course: Mildly tachycardic 90s-104, O2 sats 95% on nasal cannula, respiratory rates low 20s.  Hemoglobin markedly low at 3.5, without appreciable reticulocyte response, WBC mildly elevated at 13.3.  Bicarb - 32, chronically elevated otherwise BMP unremarkable. Trop X1 negative. Chest x-ray-chronic interstitial disease.  Anemia panel was done, 3 units of blood grouped & matched 125 mg Solu-Medrol given, started on azithromycin, duo nebs. Hospitalist was called to admit for severe anemia and COPD exacerbation.  Review of Systems: As per HPI otherwise 10 point review of systems negative.   Past Medical History:  Diagnosis Date  . Anxiety 03/13/2012  . Asthma   . 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  Marland Kitchen BPH (benign prostatic hyperplasia) 10/01/2011   Per cystoscopy  . Chronic anxiety   . Chronic back pain   . COPD (chronic obstructive pulmonary disease) ([place])   . Depression   . Emphysema lung ([place])   . GERD (gastroesophageal reflux disease)    . Headache(784.0)   . History of stomach ulcers   . Hypercholesteremia   . Hyperlipidemia 03/13/2012  . On home O2    2L N/C continuous  . Pulmonary nodule 09/2011   Stable appearance  . Tobacco abuse 09/29/2011    Past Surgical History:  Procedure Laterality Date  . CARDIAC CATHETERIZATION  2003   [facility]  . CHOLECYSTECTOMY    . CYSTOSCOPY  09/30/2011   Procedure: CYSTOSCOPY FLEXIBLE;  Surgeon: Marissa Nestle, MD;  Location: [facility];  Service: Urology;  Laterality: N/A;  . FRACTURE SURGERY     R ring finger, pin placed  . TRANSURETHRAL RESECTION OF BLADDER TUMOR  10/01/2011   Procedure: TRANSURETHRAL RESECTION OF BLADDER TUMOR (TURBT);  Surgeon: Marissa Nestle, MD;  Location: [facility];  Service: Urology;  Laterality: N/A;     reports that he has been smoking cigarettes.  He has a 29.00 pack-year smoking history. he has never used smokeless tobacco. He reports that he does not drink alcohol or use drugs.  Allergies  Allergen Reactions  . Metrizamide Hives  . Iohexol Hives     Code: HIVES, Desc: PT STATES HE BROKE OUT IN HIVES AND RASH AFTER CT NECK EARLY SEPT 2011; NO RESP PROBLEMS; NEEDS PRE-MEDS; MKS, Onset Date: [id number]   . Ivp Dye [Iodinated Diagnostic Agents] Hives    Family History  Problem Relation Age of Onset  . Kidney failure Mother   . Emphysema Brother        was not close to family; does not truly know their  medical problems.  . Emphysema Brother   . Emphysema Brother   . Cancer Neg Hx     Prior to Admission medications   Medication Sig Start Date End Date Taking? Authorizing Provider  acetaminophen (TYLENOL) 325 MG tablet Take 650 mg by mouth every 6 (six) hours as needed for headache.   Yes [provider]  albuterol (PROVENTIL) (2.5 MG/3ML) 0.083% nebulizer solution Take 3 mLs (2.5 mg total) by nebulization every 4 (four) hours as needed. For shortness of breath 11/28/15  Yes Timmothy Euler, MD  ALPRAZolam Duanne Moron) 1 MG tablet Take 1 mg by  mouth 3 (three) times daily as needed for anxiety or sleep. 03/03/17  Yes [provider]  atorvastatin (LIPITOR) 20 MG tablet Take 1 tablet by mouth daily. 03/02/16  Yes [provider]  cyanocobalamin 500 MCG tablet Take 500 mcg by mouth every morning.   Yes [provider]  DALIRESP 500 MCG TABS tablet Take 500 mcg by mouth every morning.  01/26/14  Yes [provider]  docusate sodium (COLACE) 100 MG capsule Take 100 mg by mouth daily as needed for mild constipation or moderate constipation.    Yes [provider]  DULoxetine (CYMBALTA) 60 MG capsule Take 1 capsule (60 mg total) by mouth daily. 02/21/16  Yes Timmothy Euler, MD  fluticasone furoate-vilanterol (BREO ELLIPTA) 200-25 MCG/INH AEPB Inhale 1 puff into the lungs daily. 01/01/16  Yes Hawks, Christy A, FNP  loratadine (CLARITIN) 10 MG tablet Take 10 mg by mouth every morning.    Yes [provider]  Multiple Vitamin (MULTIVITAMIN WITH MINERALS) TABS tablet Take 1 tablet by mouth daily.   Yes [provider]  NITROSTAT 0.4 MG SL tablet Place 1 tablet under the tongue every 5 (five) minutes as needed for chest pain.  02/22/13  Yes [provider]  Omega-3 Fatty Acids (FISH OIL) 1000 MG CAPS Take 3,000 mg by mouth every morning.   Yes [provider]  omeprazole (PRILOSEC) 20 MG capsule Take 20 mg by mouth daily. 11/11/16  Yes [provider]  roflumilast (DALIRESP) 500 MCG TABS tablet Take 1 Tablet by mouth once daily 11/11/16  Yes [provider]  simvastatin (ZOCOR) 40 MG tablet Take 40 mg by mouth at bedtime.    Yes [provider]  sodium chloride (OCEAN) 0.65 % nasal spray Place 1 spray into the nose as needed for congestion.    Yes [provider]  umeclidinium bromide (INCRUSE ELLIPTA) 62.5 MCG/INH AEPB Inhale 1 puff into the lungs daily. 01/01/16  Yes Hawks, Christy A, FNP  VENTOLIN HFA 108 (90 Base) MCG/ACT inhaler  Inhale 2 puffs into the lungs every 6 (six) hours as needed for wheezing or shortness of breath. 12/14/15  Yes Timmothy Euler, MD  ALPRAZolam Duanne Moron) 0.5 MG tablet Take 1 tablet (0.5 mg total) by mouth 3 (three) times daily as needed for anxiety. anxiety 12/21/15   Timmothy Euler, MD  HYDROcodone-acetaminophen (NORCO/VICODIN) 5-325 MG tablet Take 1 tablet by mouth every 6 (six) hours as needed for severe pain. Patient not taking: Reported on 04/22/2017 03/02/16   Carmin Muskrat, MD    Physical Exam: Vitals:   04/22/17 1600 04/22/17 1613 04/22/17 1617 04/22/17 1635  BP: 116/66 116/66 116/66 116/60  Pulse:  (!) 104 (!) 104 98  Resp:  (!) 22 18 16   Temp:  98.4 F (36.9 C)  98.4 F (36.9 C)  TempSrc:  Oral  Oral  SpO2:  93%     Constitutional: In mild respiratory distress, pale, extremely hard of hearing Vitals:   04/22/17 1600 04/22/17 1613 04/22/17 1617 04/22/17 1635  BP: 116/66 116/66 116/66 116/60  Pulse:  (!) 104 (!) 104 98  Resp:  (!) 22 18 16   Temp:  98.4 F (36.9 C)  98.4 F (36.9 C)  TempSrc:  Oral  Oral  SpO2:   93%    Eyes: PERRL, lids and conjunctivae normal ENMT: Mucous membranes are dry. Posterior pharynx clear of any exudate or lesions..  Neck: normal, supple, no masses, no thyromegaly Respiratory: Diffuse inspiratory and expiratory wheezing  cardiovascular: Mild tachycardia, but regular rate and rhythm, no murmurs / rubs / gallops. No extremity edema. 2+ pedal pulses. Abdomen: no tenderness, no masses palpated. No hepatosplenomegaly. Bowel sounds positive.  Musculoskeletal: no clubbing / cyanosis. No joint deformity upper and lower extremities. Good ROM, no contractures. Normal muscle tone.  Skin: no rashes, lesions, ulcers. No induration Neurologic: CN 2-12 grossly intact. Sensation intact, DTR normal. Strength 5/5 in all 4.  Psychiatric: Normal judgment and insight. Alert and oriented x 3. Normal mood.   Labs on Admission: I have personally reviewed  following labs and imaging studies  CBC: Recent Labs  Lab 04/22/17 1252  WBC 13.3*  NEUTROABS 8.1*  HGB 3.5*  HCT 13.2*  MCV 80.5  PLT 016   Basic Metabolic Panel: Recent Labs  Lab 04/22/17 1252  NA 139  K 3.8  CL 95*  CO2 32  GLUCOSE 114*  BUN 19  CREATININE 1.01  CALCIUM 9.1   Liver Function Tests: Recent Labs  Lab 04/22/17 1252  AST 23  ALT 12*  ALKPHOS 81  BILITOT 0.2*  PROT 6.8  ALBUMIN 3.3*   Cardiac Enzymes: Recent Labs  Lab 04/22/17 1252  TROPONINI <0.03   Anemia Panel: Recent Labs    04/22/17 1259  RETICCTPCT 2.6   Urine analysis:    Component Value Date/Time   COLORURINE YELLOW 09/24/2015 1951   APPEARANCEUR Turbid (A) 01/25/2016 1407   LABSPEC 1.010 09/24/2015 1951   PHURINE 7.0 09/24/2015 1951   GLUCOSEU Negative 01/25/2016 1407   HGBUR TRACE (A) 09/24/2015 1951   BILIRUBINUR Negative 01/25/2016 1407   [place] 09/24/2015 1951   PROTEINUR 3+ (A) 01/25/2016 1407   PROTEINUR NEGATIVE 09/24/2015 1951   UROBILINOGEN 0.2 05/04/2014 0956   NITRITE Positive (A) 01/25/2016 1407   NITRITE NEGATIVE 09/24/2015 1951   LEUKOCYTESUR 3+ (A) 01/25/2016 1407    Radiological Exams on Admission: Dg Chest 2 View  Result Date: 04/22/2017 CLINICAL DATA:  COPD.  Shortness of breath. EXAM: CHEST  2 VIEW COMPARISON:  CT 03/02/2016.  Chest x-ray 03/02/2016, 01/20/2014. FINDINGS: Mediastinum hilar structures are normal. Heart size stable. Stable diffuse chronic interstitial prominence noted consistent with chronic interstitial lung disease. No pleural effusion or pneumothorax. Diffuse thoracic cage osteopenia. IMPRESSION: Diffuse chronic interstitial lung disease. No acute alveolar infiltrates identified. Electronically Signed   By: Marcello Moores  Register   On: 04/22/2017 12:22   EKG: Independently reviewed.  Sinus rhythm, no change from prior  Assessment/Plan Principal Problem:   Severe anemia Active Problems:   COPD exacerbation (HCC)   Essential  hypertension   Bladder tumor   BPH (benign prostatic hyperplasia)   Anxiety   COPD exacerbation-worsening SOB, productive cough, wheeze.  Chronic O2 3-4 L at home.  Mild leukocytosis 13.3.  Chest x-ray no acute abnormality, diffuse chronic interstitial disease.  Still smokes. -IV Solu-Medrol 40 twice daily -Duo nebs -Mucolytics -  O2 sats greater than 88% -Azithromycin - PT eval  Severe anemia-hemoglobin 3.5.  Baseline 10-11. Without appropriate reticulocyte response, ?acute blood loss or pure red blood cell aplasia.  At this time denies GI or urinary blood loss.  Denies NSAID use.  Denies prior endoscopies. Patient has a history of bladder cancer and has had hematuria in the past.  -3 units typed and matched in ED, transfuse -IV Lasix 20 mg X 2 after each transfusion -Follow up Anemia panel drawn in ED before transfusion -Stool occult pending -UA- check for blood -GI consult- placed -Urology consult- placed -CBC a.m. -Will admit to floor, as patient's vitals are stable, obvious bleeding source identified at this time. - Pending anemia panel, GI, and urology eval, may need hematology eval if workup negative.  Bladder cancer-notes in care everywhere.  Last office visit 04/03/2017 with Dr. Darra Lis.  Assessment of recurrent bladder tumors, history of high-grade urothelial cancer, as pre-emptive and palliative measure , patient agreed to undergo further treatment with cystoscopy ,resection , biopsy pyelogram and possible ureteroscopy and stent placement. -Will consult urology inpatient  Anxiety-Home medications Xanax 0.5-1 mg 3 times daily, Cymbalta -New home Cymbalta -considering Respiratory status- xanax 0.5 mg BID PRN   DVT prophylaxis: SCds Code Status:Full  Family Communication: Daughter at bedside  disposition Plan: 2-3 days, daughter requests for home health for her father at the time of discharge to home Consults called: GI, urology. Admission status: Inpatient,  telemetry   [place] MD [facility] Pager [phone]  If 7PM-7AM, please contact night-coverage [email or web address] Password [facility]  04/22/2017, 4:44 PM

## 2017-04-22 NOTE — ED Triage Notes (Signed)
Pt sent from [facility] urgent care for evaluation of COPD and continued SOB.  Pt states became worse 2 days ago.

## 2017-04-22 NOTE — ED Notes (Signed)
CRITICAL VALUE ALERT  Critical Value:  Hemoglobin 3.5  Date & Time Notied:  04/22/16 1331  Provider Notified: Notified MD  Orders Received/Actions taken: Notified Dr. Dayna Barker

## 2017-04-22 NOTE — ED Notes (Signed)
Pt return from xray.

## 2017-04-22 NOTE — Progress Notes (Signed)
Pt received Lasix in between blood units with only 200 mL of urine output. Pt bladder scanned with >350 in bladder. Dr. Hilbert Bible paged and made aware. Waiting for orders/call back.

## 2017-04-23 ENCOUNTER — Inpatient Hospital Stay (HOSPITAL_COMMUNITY): Payer: Medicare Other

## 2017-04-23 DIAGNOSIS — D494 Neoplasm of unspecified behavior of bladder: Secondary | ICD-10-CM

## 2017-04-23 DIAGNOSIS — R310 Gross hematuria: Secondary | ICD-10-CM

## 2017-04-23 DIAGNOSIS — D509 Iron deficiency anemia, unspecified: Secondary | ICD-10-CM

## 2017-04-23 LAB — URINALYSIS, ROUTINE W REFLEX MICROSCOPIC
BACTERIA UA: NONE SEEN
BILIRUBIN URINE: NEGATIVE
GLUCOSE, UA: NEGATIVE mg/dL
Ketones, ur: NEGATIVE mg/dL
NITRITE: NEGATIVE
Protein, ur: 100 mg/dL — AB
SPECIFIC GRAVITY, URINE: 1.011 (ref 1.005–1.030)
pH: 6 (ref 5.0–8.0)

## 2017-04-23 LAB — BLOOD GAS, ARTERIAL
ACID-BASE EXCESS: 8 mmol/L — AB (ref 0.0–2.0)
Acid-Base Excess: 8.5 mmol/L — ABNORMAL HIGH (ref 0.0–2.0)
BICARBONATE: 31.3 mmol/L — AB (ref 20.0–28.0)
BICARBONATE: 31.9 mmol/L — AB (ref 20.0–28.0)
DRAWN BY: 235341
Drawn by: 22223
FIO2: 0.5
O2 Content: 5 L/min
O2 Content: 30 L/min
O2 SAT: 87.6 %
O2 Saturation: 96.5 %
PCO2 ART: 45.6 mmHg (ref 32.0–48.0)
PCO2 ART: 50.2 mmHg — AB (ref 32.0–48.0)
PH ART: 7.481 — AB (ref 7.350–7.450)
PH ART: 7.433 (ref 7.350–7.450)
PO2 ART: 52.5 mmHg — AB (ref 83.0–108.0)
Patient temperature: 37
pO2, Arterial: 82.8 mmHg — ABNORMAL LOW (ref 83.0–108.0)

## 2017-04-23 LAB — HEMOGLOBIN AND HEMATOCRIT, BLOOD
HCT: 21.5 % — ABNORMAL LOW (ref 39.0–52.0)
HCT: 25.9 % — ABNORMAL LOW (ref 39.0–52.0)
HEMATOCRIT: 28.3 % — AB (ref 39.0–52.0)
HEMOGLOBIN: 8.2 g/dL — AB (ref 13.0–17.0)
Hemoglobin: 6.6 g/dL — CL (ref 13.0–17.0)
Hemoglobin: 9 g/dL — ABNORMAL LOW (ref 13.0–17.0)

## 2017-04-23 LAB — CBC
HEMATOCRIT: 21.3 % — AB (ref 39.0–52.0)
Hemoglobin: 6.6 g/dL — CL (ref 13.0–17.0)
MCH: 25 pg — ABNORMAL LOW (ref 26.0–34.0)
MCHC: 31 g/dL (ref 30.0–36.0)
MCV: 80.7 fL (ref 78.0–100.0)
PLATELETS: 320 10*3/uL (ref 150–400)
RBC: 2.64 MIL/uL — ABNORMAL LOW (ref 4.22–5.81)
RDW: 17.4 % — AB (ref 11.5–15.5)
WBC: 13.7 10*3/uL — ABNORMAL HIGH (ref 4.0–10.5)

## 2017-04-23 LAB — PREPARE RBC (CROSSMATCH)

## 2017-04-23 LAB — MRSA PCR SCREENING: MRSA by PCR: NEGATIVE

## 2017-04-23 MED ORDER — FUROSEMIDE 10 MG/ML IJ SOLN
20.0000 mg | Freq: Once | INTRAMUSCULAR | Status: AC
  Administered 2017-04-23: 20 mg via INTRAVENOUS
  Filled 2017-04-23: qty 2

## 2017-04-23 MED ORDER — LORAZEPAM 0.5 MG PO TABS
0.5000 mg | ORAL_TABLET | Freq: Four times a day (QID) | ORAL | Status: DC | PRN
  Administered 2017-04-23 (×2): 0.5 mg via ORAL
  Filled 2017-04-23 (×2): qty 1

## 2017-04-23 MED ORDER — FLUTICASONE FUROATE-VILANTEROL 200-25 MCG/INH IN AEPB: 1.0000 | INHALATION_SPRAY | Freq: Every day | RESPIRATORY_TRACT | 0 refills | Status: DC

## 2017-04-23 MED ORDER — METHYLPREDNISOLONE SODIUM SUCC 125 MG IJ SOLR
60.0000 mg | Freq: Four times a day (QID) | INTRAMUSCULAR | Status: DC
  Administered 2017-04-23 (×3): 60 mg via INTRAVENOUS
  Filled 2017-04-23 (×3): qty 2

## 2017-04-23 MED ORDER — LORAZEPAM 2 MG/ML IJ SOLN
1.0000 mg | Freq: Once | INTRAMUSCULAR | Status: AC
  Administered 2017-04-23: 1 mg via INTRAVENOUS
  Filled 2017-04-23: qty 1

## 2017-04-23 MED ORDER — GUAIFENESIN-DM 100-10 MG/5ML PO SYRP: 10.0000 mL | ORAL_SOLUTION | Freq: Four times a day (QID) | ORAL | 0 refills | Status: DC

## 2017-04-23 MED ORDER — DULOXETINE HCL 60 MG PO CPEP: 60.0000 mg | ORAL_CAPSULE | Freq: Every day | ORAL | 3 refills | Status: AC

## 2017-04-23 MED ORDER — FUROSEMIDE 10 MG/ML IJ SOLN
20.0000 mg | INTRAMUSCULAR | Status: AC
  Administered 2017-04-23 (×2): 20 mg via INTRAVENOUS
  Filled 2017-04-23 (×2): qty 2

## 2017-04-23 MED ORDER — LORAZEPAM 2 MG/ML IJ SOLN: 1.0000 mg | Freq: Once | INTRAMUSCULAR | Status: DC

## 2017-04-23 MED ORDER — METHYLPREDNISOLONE SODIUM SUCC 125 MG IJ SOLR: 60.0000 mg | Freq: Four times a day (QID) | INTRAMUSCULAR | 0 refills | Status: DC

## 2017-04-23 MED ORDER — SODIUM CHLORIDE 0.9 % IV SOLN: Freq: Once | INTRAVENOUS | Status: DC

## 2017-04-23 MED ORDER — LEVOFLOXACIN 500 MG PO TABS
500.0000 mg | ORAL_TABLET | Freq: Every day | ORAL | Status: DC
  Administered 2017-04-23: 500 mg via ORAL
  Filled 2017-04-23: qty 1

## 2017-04-23 MED ORDER — GUAIFENESIN-DM 100-10 MG/5ML PO SYRP
10.0000 mL | ORAL_SOLUTION | Freq: Four times a day (QID) | ORAL | Status: DC
  Administered 2017-04-23 (×3): 10 mL via ORAL
  Filled 2017-04-23 (×3): qty 10

## 2017-04-23 MED ORDER — LORAZEPAM 0.5 MG PO TABS: 0.5000 mg | ORAL_TABLET | Freq: Four times a day (QID) | ORAL | 0 refills | Status: DC | PRN

## 2017-04-23 MED ORDER — LEVOFLOXACIN 500 MG PO TABS: 500.0000 mg | ORAL_TABLET | Freq: Every day | ORAL | 0 refills | Status: DC

## 2017-04-23 MED ORDER — IPRATROPIUM-ALBUTEROL 0.5-2.5 (3) MG/3ML IN SOLN
3.0000 mL | RESPIRATORY_TRACT | Status: DC | PRN
  Administered 2017-04-23: 3 mL via RESPIRATORY_TRACT

## 2017-04-23 NOTE — Discharge Summary (Signed)
Physician Discharge Summary      Patient: [name] [name]                   Admit date: 04/22/2017   DOB: 1940-07-25             Discharge date:04/23/2017/6:33 PM XKG:[id number]                           PCP: [name], MD Recommendations for Outpatient Follow-up:  Follow-up with his urologist once discharged from the hospital Please follow-up with your primary care physician within 2-3weeks.   Discharge Condition: Stable  CODE STATUS:  Full code  Diet recommendation: N.p.o. for now, otherwise if no surgical intervention may resume a heart healthy diet   ----------------------------------------------------------------------------------------------------------------------  Discharge Diagnoses:   Principal Problem:   Severe anemia Active Problems:   Essential hypertension   Bladder tumor   BPH (benign prostatic hyperplasia)   Anxiety   COPD exacerbation (HCC)   History of present illness : [name] [name] is a 77 y.o. male with medical history significant for COPD, bladder cancer, HTN, BPH, tobacco abuse.  Patient was brought to the ED from [facility] with complaints of increasing shortness of breath and worsening cough productive of whitish sputum of (per patient and daughter) 2-1/2 weeks duration.  Also reports wheezing.  At baseline patient is on 3-4 L of oxygen at home and still smokes cigarettes.  Patient reports he is on treatment for bladder cancer and is supposed to undergo surgery soon.  He denies seeing blood in his urine. He denies abdominal pain, denies vomiting, denies hematochezia or melena.  Patient denies NSAID use reports use of only Tylenol for pain.  Patient denies ever having colonoscopy.   ED Course: Mildly tachycardic 90s-104, O2 sats 95% on nasal cannula, respiratory rates low 20s.  Hemoglobin markedly low at 3.5, without appreciable reticulocyte response, WBC mildly elevated at 13.3.  Bicarb - 32, chronically elevated otherwise BMP  unremarkable. Trop X1 negative. Chest x-ray-chronic interstitial disease.  Anemia panel was done, 3 units of blood grouped & matched 125 mg Solu-Medrol given, started on azithromycin, duo nebs. Hospitalist was called to admit for severe anemia and COPD exacerbation.    Summary: Patient was subsequently admitted total of 5 units of packed red blood cell was transfused slowly with Lasix in between, patient continued to decline with hypoxia, oxygen demand continue to increase. We continue to witness frank hematuria and a Foley catheter. As he continues to decline: Pulmonologist Dr. Luan Pulling in ICU was consulted for evaluation. His urology Dr. Karenann Cai, at [facility] was called. Urologist on-call Dr. [name] MacDirmid was consulted. The patient was seen and examined, records were reviewed through electronic medical records. They recommended the patient to be transferred to [facility] ICU and his urologist to be consulted.  As patient is in high acuity care may need to be taken to the OR.  In this facility such interventions are limited and extensive his cancer is known from his primary urologist. We have contacted [facility] intensive care team. Patient has been accepted to the ICU for respiratory failure, frank hematuria due to ongoing bladder cancer. Dr. Lowry Ram has accepted the patient. Patient is currently stable to be transferred.  Please follow-up with his current workup including ABG repeated H&H, CT of chest abdomen pelvis.   For details please see below:   Acute respiratory failure/hypoxia /multifactorial due to  acute blood loss anemia/COPD exacerbation - So far patient has refused BiPAP, on high flow oxygen, on 6 L was satting 83%, this will be changed to nonrebreather mask, BiPAP will be offered again He has agreed if his oxygenation does not get any better for intubation. He also consented to resuscitation.  Pending CT of chest and abdomen  pelvis Pending ABG  Chest x-ray no acute abnormality,diffuse chronic interstitial disease.Still smokes. -IV Solu-Medrol, will increase dosage from 40-80 mg every 6 hours for now -Duo nebs, scheduled and as needed, Mucomyst treatment added today  -Pulmonologist Dr. Luan Pulling has been consulted.  Severe anemia/secondary to hematuria due to ongoing bladder cancer - traumatic Hemoglobin 3.5.Baseline 10-11.Without appropriate reticulocyte response,  Frank/dark blood noted in Foley cath At this time denies GI bgleed Denies NSAID use.Denies prior endoscopies. Patient has a history of bladder cancer and has had hematuria, urologist consulted appreciate their input Also consulted pending evaluation  Patient was transfused with 3 units of packed red blood cell, hemoglobin proved to 6.6, number 2 units of packed red blood cells has been ordered for transfusion -IV Lasix 20 mgX 2after each transfusion -Follow upAnemia panel was obtained prior to transfusion Urologist on-call Dr. [name] MacDirmid was consulted, who recommended for the patient to be transferred possibly ASAP  Bladder cancer-notes in care everywhere.Last office visit 04/03/2017 with Dr. Mayme Genta.Assessment of recurrent bladder tumors,history of high-grade urothelial cancer, as pre-emptive andpalliative measure ,patient agreed to undergo further treatment with cystoscopy,resection,biopsy pyelogram and possible ureteroscopyandstent placement Currently having frank hematuria and Foley cath urology Dr. Karenann Cai, at [facility] was called  Anxiety-Home medications Xanax 0.5-1 mg 3 times daily,Cymbalta -New home Cymbalta -consideringRespiratory status- xanax0.5 mg BID PRN   Disposition patient will be transferred to [facility] ICU under care of Dr. Lowry Ram  Please follow-up with repeated CBC, we have obtained CT of chest abdomen pelvis ABG  pending.   Consultations:   Pulmonologist/urologist/GI/  Procedures: Foley catheter ----------------------------------------------------------------------------------------------------------------------  Discharge Instructions:   Discharge Instructions    Bed rest   Complete by:  As directed    Diet - low sodium heart healthy   Complete by:  As directed    Discharge instructions   Complete by:  As directed    Patient will be bedbound, will be transferred to [facility].   Increase activity slowly   Complete by:  As directed        Medication List    STOP taking these medications   atorvastatin 20 MG tablet Commonly known as:  LIPITOR   loratadine 10 MG tablet Commonly known as:  CLARITIN     TAKE these medications   acetaminophen 325 MG tablet Commonly known as:  TYLENOL Take 650 mg by mouth every 6 (six) hours as needed for headache.   albuterol (2.5 MG/3ML) 0.083% nebulizer solution Commonly known as:  PROVENTIL Take 3 mLs (2.5 mg total) by nebulization every 4 (four) hours as needed. For shortness of breath   VENTOLIN HFA 108 (90 Base) MCG/ACT inhaler Generic drug:  albuterol Inhale 2 puffs into the lungs every 6 (six) hours as needed for wheezing or shortness of breath.   ALPRAZolam 0.5 MG tablet Commonly known as:  XANAX Take 1 tablet (0.5 mg total) by mouth 3 (three) times daily as needed for anxiety. anxiety   ALPRAZolam 1 MG tablet Commonly known as:  XANAX Take 1 mg by mouth 3 (three) times daily as needed for anxiety or sleep.  cyanocobalamin 500 MCG tablet Take 500 mcg by mouth every morning.   docusate sodium 100 MG capsule Commonly known as:  COLACE Take 100 mg by mouth daily as needed for mild constipation or moderate constipation.   DULoxetine 60 MG capsule Commonly known as:  CYMBALTA Take 1 capsule (60 mg total) by mouth daily. Start taking on:  04/24/2017   Fish Oil 1000 MG Caps Take 3,000 mg by mouth every  morning.   fluticasone furoate-vilanterol 200-25 MCG/INH Aepb Commonly known as:  BREO ELLIPTA Inhale 1 puff into the lungs daily. Start taking on:  04/24/2017   guaiFENesin-dextromethorphan 100-10 MG/5ML syrup Commonly known as:  ROBITUSSIN DM Take 10 mLs by mouth every 6 (six) hours.   HYDROcodone-acetaminophen 5-325 MG tablet Commonly known as:  NORCO/VICODIN Take 1 tablet by mouth every 6 (six) hours as needed for severe pain.   levofloxacin 500 MG tablet Commonly known as:  LEVAQUIN Take 1 tablet (500 mg total) by mouth daily. Start taking on:  04/24/2017   LORazepam 0.5 MG tablet Commonly known as:  ATIVAN Take 1 tablet (0.5 mg total) by mouth every 6 (six) hours as needed for anxiety.   methylPREDNISolone sodium succinate 125 mg/2 mL injection Commonly known as:  SOLU-MEDROL Inject 0.96 mLs (60 mg total) into the vein every 6 (six) hours.   multivitamin with minerals Tabs tablet Take 1 tablet by mouth daily.   NITROSTAT 0.4 MG SL tablet Generic drug:  nitroGLYCERIN Place 1 tablet under the tongue every 5 (five) minutes as needed for chest pain.   omeprazole 20 MG capsule Commonly known as:  PRILOSEC Take 20 mg by mouth daily.   roflumilast 500 MCG Tabs tablet Commonly known as:  DALIRESP Take 1 Tablet by mouth once daily What changed:  Another medication with the same name was removed. Continue taking this medication, and follow the directions you see here.   simvastatin 40 MG tablet Commonly known as:  ZOCOR Take 40 mg by mouth at bedtime.   sodium chloride 0.65 % nasal spray Commonly known as:  OCEAN Place 1 spray into the nose as needed for congestion.   umeclidinium bromide 62.5 MCG/INH Aepb Commonly known as:  INCRUSE ELLIPTA Inhale 1 puff into the lungs daily.       Allergies  Allergen Reactions  . Metrizamide Hives  . Iohexol Hives     Code: HIVES, Desc: PT STATES HE BROKE OUT IN HIVES AND RASH AFTER CT NECK EARLY SEPT 2011; NO RESP PROBLEMS;  NEEDS PRE-MEDS; MKS, Onset Date: [id number]   . Ivp Dye [Iodinated Diagnostic Agents] Hives      Procedures/Studies: Dg Chest 2 View  Result Date: 04/22/2017 CLINICAL DATA:  COPD.  Shortness of breath. EXAM: CHEST  2 VIEW COMPARISON:  CT 03/02/2016.  Chest x-ray 03/02/2016, 01/20/2014. FINDINGS: Mediastinum hilar structures are normal. Heart size stable. Stable diffuse chronic interstitial prominence noted consistent with chronic interstitial lung disease. No pleural effusion or pneumothorax. Diffuse thoracic cage osteopenia. IMPRESSION: Diffuse chronic interstitial lung disease. No acute alveolar infiltrates identified. Electronically Signed   By: Marcello Moores  Register   On: 04/22/2017 12:22   Dg Chest Port 1 View  Result Date: 04/23/2017 CLINICAL DATA:  Increased shortness of breath.  Asthma.  Smoker. EXAM: PORTABLE CHEST 1 VIEW COMPARISON:  05/02/2017. FINDINGS: Unchanged cardiomediastinal silhouette. Worsening aeration with increasing opacities bilaterally, greater on the RIGHT. Considerations would include pulmonary edema (favored) versus BILATERAL pneumonia. Continued surveillance warranted. No effusion or pneumothorax. Bones unremarkable. IMPRESSION: Increasing BILATERAL pulmonary  opacities favored to represent worsening edema. Electronically Signed   By: Staci Righter M.D.   On: 04/23/2017 08:00      Subjective: Patient was seen and examined 04/23/2017, 6:33 PM Patient stable  Today. No acute distress.  No issues overnight Stable for discharge.  Discharge Exam:  Vitals:   04/23/17 1729 04/23/17 1730 04/23/17 1743 04/23/17 1745  BP: (!) 144/81 136/88 136/88 (!) 139/91  Pulse: 97  97   Resp: (!) 31 (!) 32  (!) 34  Temp: 98.2 F (36.8 C)  98.4 F (36.9 C)   TempSrc: Oral  Oral   SpO2: 96% 95%  96%  Weight:      Height:        General: Pt lying comfortably in bed & appears in no obvious distress. Cardiovascular: S1 & S2 heard, RRR, S1/S2 +. No murmurs, rubs, gallops or clicks. No  JVD or pedal edema. Respiratory: Clear to auscultation without wheezing, rhonchi or crackles. No increased work of breathing. Abdominal:  Non distended, non tender & soft. No organomegaly or masses appreciated. Normal bowel sounds heard. CNS: Alert and oriented. No focal deficits. Extremities: no edema, no cyanosis Foley catheter in place, frank hematuria noted   The results of significant diagnostics from this hospitalization (including imaging, microbiology, ancillary and laboratory) are listed below for reference.     Microbiology: Recent Results (from the past 240 hour(s))  MRSA PCR Screening     Status: None   Collection Time: 04/23/17  7:02 AM  Result Value Ref Range Status   MRSA by PCR NEGATIVE NEGATIVE Final    Comment:        The GeneXpert MRSA Assay (FDA approved for NASAL specimens only), is one component of a comprehensive MRSA colonization surveillance program. It is not intended to diagnose MRSA infection nor to guide or monitor treatment for MRSA infections.      Labs: CBC: Recent Labs  Lab 04/22/17 1252 04/23/17 0459 04/23/17 0916 04/23/17 1531  WBC 13.3* 13.7*  --   --   NEUTROABS 8.1*  --   --   --   HGB 3.5* 6.6* 6.6* 8.2*  HCT 13.2* 21.3* 21.5* 25.9*  MCV 80.5 80.7  --   --   PLT 375 320  --   --    Basic Metabolic Panel: Recent Labs  Lab 04/22/17 1252  NA 139  K 3.8  CL 95*  CO2 32  GLUCOSE 114*  BUN 19  CREATININE 1.01  CALCIUM 9.1   Liver Function Tests: Recent Labs  Lab 04/22/17 1252  AST 23  ALT 12*  ALKPHOS 81  BILITOT 0.2*  PROT 6.8  ALBUMIN 3.3*   BNP (last 3 results) No results for input(s): BNP in the last 8760 hours. Cardiac Enzymes: Recent Labs  Lab 04/22/17 1252  TROPONINI <0.03   CBG: No results for input(s): GLUCAP in the last 168 hours. Hgb A1c No results for input(s): HGBA1C in the last 72 hours. Lipid Profile No results for input(s): CHOL, HDL, LDLCALC, TRIG, CHOLHDL, LDLDIRECT in the last 72  hours. Thyroid function studies No results for input(s): TSH, T4TOTAL, T3FREE, THYROIDAB in the last 72 hours.  Invalid input(s): FREET3 Anemia work up Recent Labs    04/22/17 1259 04/22/17 1401 04/22/17 1402  VITAMINB12  --  1,859*  --   FOLATE  --   --  39.0  FERRITIN  --  5*  --   TIBC  --  512*  --   IRON  --  6*  --   RETICCTPCT 2.6  --   --    Urinalysis    Component Value Date/Time   COLORURINE YELLOW 04/23/2017 0245   APPEARANCEUR CLOUDY (A) 04/23/2017 0245   APPEARANCEUR Turbid (A) 01/25/2016 1407   LABSPEC 1.011 04/23/2017 0245   PHURINE 6.0 04/23/2017 0245   GLUCOSEU NEGATIVE 04/23/2017 0245   HGBUR LARGE (A) 04/23/2017 0245   BILIRUBINUR NEGATIVE 04/23/2017 0245   BILIRUBINUR Negative 01/25/2016 1407   KETONESUR NEGATIVE 04/23/2017 0245   PROTEINUR 100 (A) 04/23/2017 0245   UROBILINOGEN 0.2 05/04/2014 0956   NITRITE NEGATIVE 04/23/2017 0245   LEUKOCYTESUR TRACE (A) 04/23/2017 0245   LEUKOCYTESUR 3+ (A) 01/25/2016 1407      Time coordinating discharge: Over 30 minutes  SIGNED: Deatra James, MD, FACP, FHM. [facility] Pager [phone]  If 7PM-7AM, please contact night-coverage [email or web address] Password [facility] 04/23/2017, 6:33 PM

## 2017-04-23 NOTE — H&P (Signed)
Consult requested by: [facility], DR. Shahmehdi Consult requested for: Shortness of breath, respiratory failure  HPI: This is a 77 year old who is known to have multiple medical problems including COPD, bladder cancer, hypertension, BPH, continued tobacco abuse chronic hypoxic respiratory failure.  He was sent from an urgent care center because of increasing shortness of breath cough and congestion.  He was wheezing.  He is known to have bladder cancer.  He was found to be tachycardic tachypneic and with hemoglobin of 3.5.  He has required multiple blood transfusions.  He is still having hematuria.  He has had more oxygen requirement and appears to be volume overloaded.  He denies any pain.  He says he is short of breath.  He is coughing mostly nonproductively.  He does not have any chest pain.  No nausea or vomiting.  Past Medical History:  Diagnosis Date  . Anxiety 03/13/2012  . Asthma   . 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  Marland Kitchen BPH (benign prostatic hyperplasia) 10/01/2011   Per cystoscopy  . Chronic anxiety   . Chronic back pain   . COPD (chronic obstructive pulmonary disease) ([place])   . Depression   . Emphysema lung ([place])   . GERD (gastroesophageal reflux disease)   . Headache(784.0)   . History of stomach ulcers   . Hypercholesteremia   . Hyperlipidemia 03/13/2012  . On home O2    2L N/C continuous  . Pulmonary nodule 09/2011   Stable appearance  . Tobacco abuse 09/29/2011     Family History  Problem Relation Age of Onset  . Kidney failure Mother   . Emphysema Brother        was not close to family; does not truly know their medical problems.  . Emphysema Brother   . Emphysema Brother   . Cancer Neg Hx      Social History   Socioeconomic History  . Marital status: Divorced    Spouse name: None  . Number of children: None  . Years of education: None  . Highest education level: None  Social Needs  . Financial resource strain: None  .  Food insecurity - worry: None  . Food insecurity - inability: None  . Transportation needs - medical: None  . Transportation needs - non-medical: None  Occupational History  . None  Tobacco Use  . Smoking status: Current Every Day Smoker    Packs/day: 0.50    Years: 58.00    Pack years: 29.00    Types: Cigarettes  . Smokeless tobacco: Never Used  Substance and Sexual Activity  . Alcohol use: No  . Drug use: No  . Sexual activity: No  Other Topics Concern  . None  Social History Narrative  . None     ROS: Except as mentioned 10 point review of systems is negative    Objective: Vital signs in last 24 hours: Temp:  [98.1 F (36.7 C)-99.6 F (37.6 C)] 98.2 F (36.8 C) (01/09 1729) Pulse Rate:  [89-103] 97 (01/09 1729) Resp:  [18-34] 32 (01/09 1730) BP: (113-190)/(55-106) 136/88 (01/09 1730) SpO2:  [81 %-100 %] 95 % (01/09 1730) FiO2 (%):  [40 %-60 %] 60 % (01/09 1729) Weight:  [83 kg (182 lb 15.7 oz)] 83 kg (182 lb 15.7 oz) (01/08 2007) Weight change:  Last BM Date: 04/22/17  Intake/Output from previous day: 01/08 0701 - 01/09 0700 In: 1916 [P.O.:840; Blood:326; IV Piggyback:750] Out: 1150 [HUTML:4650]  PHYSICAL EXAM Constitutional: He is awake and  alert and in mild to moderate distress.  He is wearing high flow nasal oxygen.  Eyes: Pupils react.  EOMI.  Ears nose mouth and throat: Hearing is grossly normal.  Throat is clear.  Cardiovascular: His heart is regular with tachycardia and no gallop now.  Respiratory: His respiratory effort is increased.  He has rales bilaterally.  Gastrointestinal: His abdomen is soft with no masses.  Skin: Warm and dry.  Neurological: No focal abnormalities.  Musculoskeletal: Strength is grossly normal psychiatric: He seems a little anxious  Lab Results: Basic Metabolic Panel: Recent Labs    04/22/17 1252  NA 139  K 3.8  CL 95*  CO2 32  GLUCOSE 114*  BUN 19  CREATININE 1.01  CALCIUM 9.1   Liver Function Tests: Recent Labs     04/22/17 1252  AST 23  ALT 12*  ALKPHOS 81  BILITOT 0.2*  PROT 6.8  ALBUMIN 3.3*   No results for input(s): LIPASE, AMYLASE in the last 72 hours. No results for input(s): AMMONIA in the last 72 hours. CBC: Recent Labs    04/22/17 1252 04/23/17 0459 04/23/17 0916 04/23/17 1531  WBC 13.3* 13.7*  --   --   NEUTROABS 8.1*  --   --   --   HGB 3.5* 6.6* 6.6* 8.2*  HCT 13.2* 21.3* 21.5* 25.9*  MCV 80.5 80.7  --   --   PLT 375 320  --   --    Cardiac Enzymes: Recent Labs    04/22/17 1252  TROPONINI <0.03   BNP: No results for input(s): PROBNP in the last 72 hours. D-Dimer: No results for input(s): DDIMER in the last 72 hours. CBG: No results for input(s): GLUCAP in the last 72 hours. Hemoglobin A1C: No results for input(s): HGBA1C in the last 72 hours. Fasting Lipid Panel: No results for input(s): CHOL, HDL, LDLCALC, TRIG, CHOLHDL, LDLDIRECT in the last 72 hours. Thyroid Function Tests: No results for input(s): TSH, T4TOTAL, FREET4, T3FREE, THYROIDAB in the last 72 hours. Anemia Panel: Recent Labs    04/22/17 1259 04/22/17 1401 04/22/17 1402  VITAMINB12  --  1,859*  --   FOLATE  --   --  39.0  FERRITIN  --  5*  --   TIBC  --  512*  --   IRON  --  6*  --   RETICCTPCT 2.6  --   --    Coagulation: No results for input(s): LABPROT, INR in the last 72 hours. Urine Drug Screen: Drugs of Abuse  No results found for: LABOPIA, COCAINSCRNUR, LABBENZ, AMPHETMU, THCU, LABBARB  Alcohol Level: No results for input(s): ETH in the last 72 hours. Urinalysis: Recent Labs    04/23/17 0245  COLORURINE YELLOW  LABSPEC 1.011  PHURINE 6.0  GLUCOSEU NEGATIVE  HGBUR LARGE*  BILIRUBINUR NEGATIVE  KETONESUR NEGATIVE  PROTEINUR 100*  NITRITE NEGATIVE  LEUKOCYTESUR TRACE*   Misc. Labs:   ABGS: Recent Labs    04/23/17 0610  PHART 7.481*  PO2ART 52.5*  HCO3 31.3*     MICROBIOLOGY: Recent Results (from the past 240 hour(s))  MRSA PCR Screening     Status: None    Collection Time: 04/23/17  7:02 AM  Result Value Ref Range Status   MRSA by PCR NEGATIVE NEGATIVE Final    Comment:        The GeneXpert MRSA Assay (FDA approved for NASAL specimens only), is one component of a comprehensive MRSA colonization surveillance program. It is not intended to diagnose MRSA  infection nor to guide or monitor treatment for MRSA infections.     Studies/Results: Dg Chest 2 View  Result Date: 04/22/2017 CLINICAL DATA:  COPD.  Shortness of breath. EXAM: CHEST  2 VIEW COMPARISON:  CT 03/02/2016.  Chest x-ray 03/02/2016, 01/20/2014. FINDINGS: Mediastinum hilar structures are normal. Heart size stable. Stable diffuse chronic interstitial prominence noted consistent with chronic interstitial lung disease. No pleural effusion or pneumothorax. Diffuse thoracic cage osteopenia. IMPRESSION: Diffuse chronic interstitial lung disease. No acute alveolar infiltrates identified. Electronically Signed   By: Marcello Moores  Register   On: 04/22/2017 12:22   Dg Chest Port 1 View  Result Date: 04/23/2017 CLINICAL DATA:  Increased shortness of breath.  Asthma.  Smoker. EXAM: PORTABLE CHEST 1 VIEW COMPARISON:  05/02/2017. FINDINGS: Unchanged cardiomediastinal silhouette. Worsening aeration with increasing opacities bilaterally, greater on the RIGHT. Considerations would include pulmonary edema (favored) versus BILATERAL pneumonia. Continued surveillance warranted. No effusion or pneumothorax. Bones unremarkable. IMPRESSION: Increasing BILATERAL pulmonary opacities favored to represent worsening edema. Electronically Signed   By: Staci Righter M.D.   On: 04/23/2017 08:00    Medications:  Prior to Admission:  Medications Prior to Admission  Medication Sig Dispense Refill Last Dose  . acetaminophen (TYLENOL) 325 MG tablet Take 650 mg by mouth every 6 (six) hours as needed for headache.   unknown  . albuterol (PROVENTIL) (2.5 MG/3ML) 0.083% nebulizer solution Take 3 mLs (2.5 mg total) by  nebulization every 4 (four) hours as needed. For shortness of breath 75 mL 5 04/22/2017 at Unknown time  . ALPRAZolam (XANAX) 1 MG tablet Take 1 mg by mouth 3 (three) times daily as needed for anxiety or sleep.   04/20/2017 at Unknown time  . atorvastatin (LIPITOR) 20 MG tablet Take 1 tablet by mouth daily.   04/20/2017  . cyanocobalamin 500 MCG tablet Take 500 mcg by mouth every morning.   04/20/2017  . DALIRESP 500 MCG TABS tablet Take 500 mcg by mouth every morning.    04/22/2017 at Unknown time  . docusate sodium (COLACE) 100 MG capsule Take 100 mg by mouth daily as needed for mild constipation or moderate constipation.    04/22/2017 at Unknown time  . DULoxetine (CYMBALTA) 60 MG capsule Take 1 capsule (60 mg total) by mouth daily. 30 capsule 4 04/22/2017 at Unknown time  . fluticasone furoate-vilanterol (BREO ELLIPTA) 200-25 MCG/INH AEPB Inhale 1 puff into the lungs daily. 1 each 11 04/21/2017 at Unknown time  . loratadine (CLARITIN) 10 MG tablet Take 10 mg by mouth every morning.    04/22/2017 at Unknown time  . Multiple Vitamin (MULTIVITAMIN WITH MINERALS) TABS tablet Take 1 tablet by mouth daily.   04/22/2017 at Unknown time  . NITROSTAT 0.4 MG SL tablet Place 1 tablet under the tongue every 5 (five) minutes as needed for chest pain.    unknown  . Omega-3 Fatty Acids (FISH OIL) 1000 MG CAPS Take 3,000 mg by mouth every morning.   04/20/2017  . omeprazole (PRILOSEC) 20 MG capsule Take 20 mg by mouth daily.   04/22/2017 at Unknown time  . roflumilast (DALIRESP) 500 MCG TABS tablet Take 1 Tablet by mouth once daily   04/22/2017 at Unknown time  . simvastatin (ZOCOR) 40 MG tablet Take 40 mg by mouth at bedtime.    04/22/2017 at Unknown time  . sodium chloride (OCEAN) 0.65 % nasal spray Place 1 spray into the nose as needed for congestion.    unknown  . umeclidinium bromide (INCRUSE ELLIPTA) 62.5 MCG/INH AEPB  Inhale 1 puff into the lungs daily. 1 each 11 04/22/2017 at Unknown time  . VENTOLIN HFA 108 (90 Base) MCG/ACT inhaler  Inhale 2 puffs into the lungs every 6 (six) hours as needed for wheezing or shortness of breath. 1 Inhaler 2 unknown  . ALPRAZolam (XANAX) 0.5 MG tablet Take 1 tablet (0.5 mg total) by mouth 3 (three) times daily as needed for anxiety. anxiety 90 tablet 2 03/02/2016 at Unknown time  . HYDROcodone-acetaminophen (NORCO/VICODIN) 5-325 MG tablet Take 1 tablet by mouth every 6 (six) hours as needed for severe pain. (Patient not taking: Reported on 04/22/2017) 15 tablet 0 Not Taking at Unknown time   Scheduled: . atorvastatin  20 mg Oral q1800  . DULoxetine  60 mg Oral Daily  . fluticasone furoate-vilanterol  1 puff Inhalation Daily  . guaiFENesin-dextromethorphan  10 mL Oral Q6H  . ipratropium-albuterol  3 mL Nebulization Q6H  . levofloxacin  500 mg Oral Daily  . loratadine  10 mg Oral q morning - 10a  . methylPREDNISolone (SOLU-MEDROL) injection  60 mg Intravenous Q6H  . pantoprazole  40 mg Oral Daily  . roflumilast  500 mcg Oral q morning - 10a  . umeclidinium bromide  1 puff Inhalation Daily   Continuous: . sodium chloride    . sodium chloride     QQI:[id number], ipratropium-albuterol, LORazepam  Assesment: He was admitted with severe anemia.  He has a bladder tumor and it was being planned for him to have a bladder resection.  He has apparently been bleeding for some time.  He has required multiple blood transfusions and I think he is volume overloaded now.  He is receiving Lasix.  At baseline he has pretty severe COPD and he is still smoking.  He has COPD exacerbation which is actually the original complaint and his anemia was found later.  He has chronic hypoxic respiratory failure.  His hemoglobin level has improved and the last one was 8.2 but he is still having bleeding.  He is scheduled for CT abdomen and pelvis and chest  Initial chest x-ray more consistent with interstitial lung disease chest x-ray this morning looks more like he had volume overload.  I have personally reviewed both  films.  CT chest is pending. Principal Problem:   Severe anemia Active Problems:   Essential hypertension   Bladder tumor   BPH (benign prostatic hyperplasia)   Anxiety   COPD exacerbation ([facility])    Plan: Continue blood replacement.  Continue Lasix.  Await results of CTs.  He does agree that he would like to be intubated and placed on mechanical ventilation if it came to that.  We could try BiPAP first.  He is high risk for needing intubation and mechanical ventilation considering his ongoing issues with bleeding.    LOS: 1 day   Eschol Auxier L 04/23/2017, 5:44 PM

## 2017-04-23 NOTE — Progress Notes (Signed)
Verbal order from Dr. Roger Shelter for 20 mg lasix now and 20 mg lasix after blood administration

## 2017-04-23 NOTE — Progress Notes (Signed)
Repeat hemoglobin 6.6 after 3 units of PRBC transfusion.  Will order another 2 units of PRBC at this time.  Baseline hemoglobin 10-11.

## 2017-04-23 NOTE — Progress Notes (Addendum)
Rn spoke with Dr. [name] about pts respiratory status at 32bpm. Dr. [name] ordered oral Ativan, Prn duonebs and repeat CXR. Pt to be transferred to stepdown unit d/t RR and needing bipap. Dr. [name] stated he would put the order in.

## 2017-04-23 NOTE — Progress Notes (Signed)
PT Cancellation Note  Patient Details Name: [name] [name] MRN: [id number] DOB: [birth year]   Cancelled Treatment:    Reason Eval/Treat Not Completed: Medical issues which prohibited therapy.  Patient in process of receiving 2 units of blood and will check back tomorrow to start physical therapy.   2:39 PM, 04/23/17 Lonell Grandchild, MPT Physical Therapist with [facility] [phone] office [phone] mobile phone

## 2017-04-23 NOTE — Progress Notes (Signed)
Dr. [name] ordered a couple more units for pt. Report called and given to [facility] in [place]. pts Daughter [name] [name] called, vmail left for her.

## 2017-04-23 NOTE — Progress Notes (Signed)
Pt desaturated to 79% FIO2 increased to 60%. Pt diaphoretic and anxious. Respiratory called. Breathing treatment given. Patient saturated back up to 99% with breathing treatment and heated HFNC. 0.5 mg PO Ativan given with no relief.  Dr. Nila Nephew paged about possible fluid overload No orders given at this time, but will not administer 2nd unit of blood until further evaluation.

## 2017-04-23 NOTE — Progress Notes (Signed)
CRITICAL VALUE ALERT  Critical Value:  Hemoglobin 6.6  Date & Time Notied:  04/23/17 @ 0630  Provider Notified: Dr. [name]  Orders Received/Actions taken: waiting for call back/orders

## 2017-04-23 NOTE — Progress Notes (Signed)
New order for foley catheter to be placed d/t urinary retention per Dr. Hilbert Bible. 26french foley placed. Urine return seen. Will continue to monitor pt

## 2017-04-23 NOTE — Consult Note (Signed)
Urology Consult  Referring physician: Cherlyn Labella Reason for referral: hematuria  Chief Complaint: hematuria and bladder tumor  History of Present Illness: Patient with advanced recurrent bladder cancer; BCG; multiple co-morbidities; blood x one month; home oxygen and not a cystectomy candidate; prolonged ICU stay at [facility] after TURBT; palliative repeat resection has been recommended by [facility]; no pain; SOB;   Saw Dr Gloriann Loan and Dr Gloriann Loan and I spoke tonight: recommends transfer to [facility] for TUR if needed due to medical status etc  Foley is draining well  Left notes at nursing station Dr Gloriann Loan  Modifying factors: There are no other modifying factors  Associated signs and symptoms: There are no other associated signs and symptoms Aggravating and relieving factors: There are no other aggravating or relieving factors Severity: Moderate Duration: Persistent    Past Medical History:  Diagnosis Date  . Anxiety 03/13/2012  . Asthma   . 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  Marland Kitchen BPH (benign prostatic hyperplasia) 10/01/2011   Per cystoscopy  . Chronic anxiety   . Chronic back pain   . COPD (chronic obstructive pulmonary disease) ([place])   . Depression   . Emphysema lung ([place])   . GERD (gastroesophageal reflux disease)   . Headache(784.0)   . History of stomach ulcers   . Hypercholesteremia   . Hyperlipidemia 03/13/2012  . On home O2    2L N/C continuous  . Pulmonary nodule 09/2011   Stable appearance  . Tobacco abuse 09/29/2011   Past Surgical History:  Procedure Laterality Date  . CARDIAC CATHETERIZATION  2003   [facility]  . CHOLECYSTECTOMY    . CYSTOSCOPY  09/30/2011   Procedure: CYSTOSCOPY FLEXIBLE;  Surgeon: Marissa Nestle, MD;  Location: [facility];  Service: Urology;  Laterality: N/A;  . FRACTURE SURGERY     R ring finger, pin placed  . TRANSURETHRAL RESECTION OF BLADDER TUMOR  10/01/2011   Procedure: TRANSURETHRAL RESECTION OF BLADDER TUMOR  (TURBT);  Surgeon: Marissa Nestle, MD;  Location: [facility];  Service: Urology;  Laterality: N/A;    Medications: I have reviewed the patient's current medications. Allergies:  Allergies  Allergen Reactions  . Metrizamide Hives  . Iohexol Hives     Code: HIVES, Desc: PT STATES HE BROKE OUT IN HIVES AND RASH AFTER CT NECK EARLY SEPT 2011; NO RESP PROBLEMS; NEEDS PRE-MEDS; MKS, Onset Date: [id number]   . Ivp Dye [Iodinated Diagnostic Agents] Hives    Family History  Problem Relation Age of Onset  . Kidney failure Mother   . Emphysema Brother        was not close to family; does not truly know their medical problems.  . Emphysema Brother   . Emphysema Brother   . Cancer Neg Hx    Social History:  reports that he has been smoking cigarettes.  He has a 29.00 pack-year smoking history. he has never used smokeless tobacco. He reports that he does not drink alcohol or use drugs.  ROS: All systems are reviewed and negative except as noted. Rest negative  Physical Exam:  Vital signs in last 24 hours: Temp:  [98.1 F (36.7 C)-99.6 F (37.6 C)] 98.4 F (36.9 C) (01/09 1743) Pulse Rate:  [89-103] 97 (01/09 1743) Resp:  [18-34] 34 (01/09 1745) BP: (113-190)/(55-106) 139/91 (01/09 1745) SpO2:  [81 %-100 %] 96 % (01/09 1745) FiO2 (%):  [40 %-60 %] 60 % (01/09 1729) Weight:  [83 kg (182 lb 15.7 oz)] 83 kg (  182 lb 15.7 oz) (01/08 2007)  Cardiovascular: Skin warm; not flushed Respiratory: Breaths quiet; no shortness of breath Abdomen: No masses Neurological: Normal sensation to touch Musculoskeletal: Normal motor function arms and legs Lymphatics: No inguinal adenopathy Skin: No rashes Genitourinary:belly soft; catheter modest hematuria draining well  Laboratory Data:  Results for orders placed or performed during the hospital encounter of 04/22/17 (from the past 72 hour(s))  CBC with Differential     Status: Abnormal   Collection Time: 04/22/17 12:52 PM  Result Value Ref Range    WBC 13.3 (H) 4.0 - 10.5 K/uL   RBC 1.64 (L) 4.22 - 5.81 MIL/uL   Hemoglobin 3.5 (LL) 13.0 - 17.0 g/dL    Comment: REPEATED TO VERIFY CRITICAL RESULT CALLED TO, READ BACK BY AND VERIFIED WITH: E WILEY AT 1330 BY HFLYNT 04/22/17    HCT 13.2 (L) 39.0 - 52.0 %   MCV 80.5 78.0 - 100.0 fL   MCH 21.3 (L) 26.0 - 34.0 pg   MCHC 26.5 (L) 30.0 - 36.0 g/dL   RDW 19.3 (H) 11.5 - 15.5 %   Platelets 375 150 - 400 K/uL   Neutrophils Relative % 60 %   Neutro Abs 8.1 (H) 1.7 - 7.7 K/uL   Lymphocytes Relative 27 %   Lymphs Abs 3.6 0.7 - 4.0 K/uL   Monocytes Relative 10 %   Monocytes Absolute 1.3 (H) 0.1 - 1.0 K/uL   Eosinophils Relative 2 %   Eosinophils Absolute 0.2 0.0 - 0.7 K/uL   Basophils Relative 1 %   Basophils Absolute 0.1 0.0 - 0.1 K/uL  Comprehensive metabolic panel     Status: Abnormal   Collection Time: 04/22/17 12:52 PM  Result Value Ref Range   Sodium 139 135 - 145 mmol/L   Potassium 3.8 3.5 - 5.1 mmol/L   Chloride 95 (L) 101 - 111 mmol/L   CO2 32 22 - 32 mmol/L   Glucose, Bld 114 (H) 65 - 99 mg/dL   BUN 19 6 - 20 mg/dL   Creatinine, Ser 1.01 0.61 - 1.24 mg/dL   Calcium 9.1 8.9 - 10.3 mg/dL   Total Protein 6.8 6.5 - 8.1 g/dL   Albumin 3.3 (L) 3.5 - 5.0 g/dL   AST 23 15 - 41 U/L   ALT 12 (L) 17 - 63 U/L   Alkaline Phosphatase 81 38 - 126 U/L   Total Bilirubin 0.2 (L) 0.3 - 1.2 mg/dL   GFR calc non Af Amer >60 >60 mL/min   GFR calc Af Amer >60 >60 mL/min    Comment: (NOTE) The eGFR has been calculated using the CKD EPI equation. This calculation has not been validated in all clinical situations. eGFR's persistently <60 mL/min signify possible Chronic Kidney Disease.    Anion gap 12 5 - 15  Troponin I     Status: None   Collection Time: 04/22/17 12:52 PM  Result Value Ref Range   Troponin I <0.03 <0.03 ng/mL  Reticulocytes     Status: Abnormal   Collection Time: 04/22/17 12:59 PM  Result Value Ref Range   Retic Ct Pct 2.6 0.4 - 3.1 %   RBC. 1.65 (L) 4.22 - 5.81 MIL/uL    Retic Count, Absolute 42.9 19.0 - 186.0 K/uL  Type and screen     Status: None (Preliminary result)   Collection Time: 04/22/17  1:59 PM  Result Value Ref Range   ABO/RH(D) O POS    Antibody Screen NEG    Sample Expiration 04/25/2017  Unit Number [id number]    Blood Component Type RED CELLS,LR    Unit division 00    Status of Unit ISSUED,FINAL    Transfusion Status OK TO TRANSFUSE    Crossmatch Result Compatible    Unit Number [id number]    Blood Component Type RED CELLS,LR    Unit division 00    Status of Unit ISSUED,FINAL    Transfusion Status OK TO TRANSFUSE    Crossmatch Result Compatible    Unit Number [id number]    Blood Component Type RED CELLS,LR    Unit division 00    Status of Unit ISSUED,FINAL    Transfusion Status OK TO TRANSFUSE    Crossmatch Result Compatible    Unit Number [id number]    Blood Component Type RED CELLS,LR    Unit division 00    Status of Unit ISSUED    Transfusion Status OK TO TRANSFUSE    Crossmatch Result Compatible    Unit Number [id number]    Blood Component Type RED CELLS,LR    Unit division 00    Status of Unit ISSUED    Transfusion Status OK TO TRANSFUSE    Crossmatch Result Compatible   Prepare RBC     Status: None   Collection Time: 04/22/17  1:59 PM  Result Value Ref Range   Order Confirmation ORDER PROCESSED BY BLOOD BANK   Vitamin B12     Status: Abnormal   Collection Time: 04/22/17  2:01 PM  Result Value Ref Range   Vitamin B-12 1,859 (H) 180 - 914 pg/mL    Comment: (NOTE) This assay is not validated for testing neonatal or myeloproliferative syndrome specimens for Vitamin B12 levels. Performed at [facility], [place] [address], [place], [place] [address]   Iron and TIBC     Status: Abnormal   Collection Time: 04/22/17  2:01 PM  Result Value Ref Range   Iron 6 (L) 45 - 182 ug/dL   TIBC 512 (H) 250 - 450 ug/dL   Saturation Ratios 1 (L) 17.9 - 39.5 %   UIBC 506 ug/dL    Comment: Performed at  [facility], [place] [address], [place], [place] [address]  Ferritin     Status: Abnormal   Collection Time: 04/22/17  2:01 PM  Result Value Ref Range   Ferritin 5 (L) 24 - 336 ng/mL    Comment: Performed at [facility], [place] [address] [place], [place] [address]  Folate     Status: None   Collection Time: 04/22/17  2:02 PM  Result Value Ref Range   Folate 39.0 >5.9 ng/mL    Comment: Performed at [facility], [place] [address] [place], [place] [address]  Prepare RBC     Status: None   Collection Time: 04/22/17  2:02 PM  Result Value Ref Range   Order Confirmation ORDER PROCESSED BY BLOOD BANK   ABO/Rh     Status: None   Collection Time: 04/22/17  2:02 PM  Result Value Ref Range   ABO/RH(D) O POS   POC occult blood, ED     Status: None   Collection Time: 04/22/17  2:46 PM  Result Value Ref Range   Fecal Occult Bld NEGATIVE NEGATIVE  Urinalysis, Routine w reflex microscopic     Status: Abnormal   Collection Time: 04/23/17  2:45 AM  Result Value Ref Range   Color, Urine YELLOW YELLOW   APPearance CLOUDY (A) CLEAR   Specific Gravity, Urine 1.011  1.005 - 1.030   pH 6.0 5.0 - 8.0   Glucose, UA NEGATIVE NEGATIVE mg/dL   Hgb urine dipstick LARGE (A) NEGATIVE   Bilirubin Urine NEGATIVE NEGATIVE   Ketones, ur NEGATIVE NEGATIVE mg/dL   Protein, ur 100 (A) NEGATIVE mg/dL   Nitrite NEGATIVE NEGATIVE   Leukocytes, UA TRACE (A) NEGATIVE   RBC / HPF TOO NUMEROUS TO COUNT 0 - 5 RBC/hpf   WBC, UA TOO NUMEROUS TO COUNT 0 - 5 WBC/hpf   Bacteria, UA NONE SEEN NONE SEEN   Squamous Epithelial / LPF 0-5 (A) NONE SEEN   Non Squamous Epithelial 0-5 (A) NONE SEEN  CBC     Status: Abnormal   Collection Time: 04/23/17  4:59 AM  Result Value Ref Range   WBC 13.7 (H) 4.0 - 10.5 K/uL   RBC 2.64 (L) 4.22 - 5.81 MIL/uL   Hemoglobin 6.6 (LL) 13.0 - 17.0 g/dL    Comment: RESULT REPEATED AND VERIFIED CRITICAL RESULT CALLED TO, READ BACK BY AND VERIFIED WITH: AMBURN,A AT 0630 BY  HUFFINES,S ON 04/23/17.    HCT 21.3 (L) 39.0 - 52.0 %   MCV 80.7 78.0 - 100.0 fL   MCH 25.0 (L) 26.0 - 34.0 pg   MCHC 31.0 30.0 - 36.0 g/dL   RDW 17.4 (H) 11.5 - 15.5 %   Platelets 320 150 - 400 K/uL  Blood gas, arterial     Status: Abnormal   Collection Time: 04/23/17  6:10 AM  Result Value Ref Range   O2 Content 5.0 L/min   Delivery systems NASAL CANNULA    pH, Arterial 7.481 (H) 7.350 - 7.450   pCO2 arterial 45.6 32.0 - 48.0 mmHg   pO2, Arterial 52.5 (L) 83.0 - 108.0 mmHg   Bicarbonate 31.3 (H) 20.0 - 28.0 mmol/L   Acid-Base Excess 8.0 (H) 0.0 - 2.0 mmol/L   O2 Saturation 87.6 %   Collection site RIGHT RADIAL    Drawn by [id number]    Sample type ARTERIAL    Allens test (pass/fail) PASS PASS  MRSA PCR Screening     Status: None   Collection Time: 04/23/17  7:02 AM  Result Value Ref Range   MRSA by PCR NEGATIVE NEGATIVE    Comment:        The GeneXpert MRSA Assay (FDA approved for NASAL specimens only), is one component of a comprehensive MRSA colonization surveillance program. It is not intended to diagnose MRSA infection nor to guide or monitor treatment for MRSA infections.   Hemoglobin and hematocrit, blood     Status: Abnormal   Collection Time: 04/23/17  9:16 AM  Result Value Ref Range   Hemoglobin 6.6 (LL) 13.0 - 17.0 g/dL    Comment: REPEATED TO VERIFY CRITICAL RESULT CALLED TO, READ BACK BY AND VERIFIED WITH: WREYNOLDS AT 1000 BY HFLYNT 04/23/17 DELTA CHECK NOTED POST TRANSFUSION SPECIMEN    HCT 21.5 (L) 39.0 - 52.0 %  Hemoglobin and hematocrit, blood     Status: Abnormal   Collection Time: 04/23/17  3:31 PM  Result Value Ref Range   Hemoglobin 8.2 (L) 13.0 - 17.0 g/dL   HCT 25.9 (L) 39.0 - 52.0 %   Recent Results (from the past 240 hour(s))  MRSA PCR Screening     Status: None   Collection Time: 04/23/17  7:02 AM  Result Value Ref Range Status   MRSA by PCR NEGATIVE NEGATIVE Final    Comment:        The GeneXpert MRSA Assay (  FDA approved for NASAL  specimens only), is one component of a comprehensive MRSA colonization surveillance program. It is not intended to diagnose MRSA infection nor to guide or monitor treatment for MRSA infections.    Creatinine: Recent Labs    04/22/17 1252  CREATININE 1.01    Xrays: See report/chart Pending CT scan  Impression/Assessment:  Bleeding recurrent bladder cancer- very high risk for anesthesia;   Plan:  Spoke with Dr Gloriann Loan: recommend medical therapy and continual transfusion; transer to [facility] for Medical care and if needed Urology Care (based on known patient to them, medical risks of procedure); patient agreed with plan;  Raydin Bielinski A 04/23/2017, 6:13 PM

## 2017-04-23 NOTE — Progress Notes (Signed)
Spoke to ground transport with [facility]. They informed they would be at hospital within the hour.  Called pt daughter and updated on transport and of room number.

## 2017-04-23 NOTE — Progress Notes (Signed)
Pts RR 28 and keeps stating he needs a breathing treatment. Adv he has no PRN breathing txs ordered and his xanax is not due until 0700. Dr. [name] paged to see if pt could get something else for anxiety. Waiting for orders/call back.

## 2017-04-23 NOTE — Consult Note (Signed)
Reason for Consult: severe anemia Referring Physician: Hospitalist services  [name] [name] is an 77 y.o. male.  HPI: Admitted thru the ED yesterday after being seen at [facility].  Has been SOB with productive cough for about 2 1/2 weeks. Coughing up white mucous. Hx of COPD and maintained on 02 4 liters at home.  He denies any rectal bleeding. He does have bleeding from his penis.  Foley this am has dark red urine in the bag.  Admission hemoglobin 3.5. Has received 3 units of PRBCs. Hemoglobin this am 6.6.  In August of 2017 hemoglobin 11.9. States has had hematuria for about a week or so. Patient has never undergone a colonoscopy. Hx of bladder cancer and is followed by Dr. Greer Pickerel at [facility]. Recently seen by Urology 04/03/2017. Has not been on ASA for about a year. Quit smoking 2 days ago.    Past Medical History:  Diagnosis Date  . Anxiety 03/13/2012  . Asthma   . 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  Marland Kitchen BPH (benign prostatic hyperplasia) 10/01/2011   Per cystoscopy  . Chronic anxiety   . Chronic back pain   . COPD (chronic obstructive pulmonary disease) ([place])   . Depression   . Emphysema lung ([place])   . GERD (gastroesophageal reflux disease)   . Headache(784.0)   . History of stomach ulcers   . Hypercholesteremia   . Hyperlipidemia 03/13/2012  . On home O2    2L N/C continuous  . Pulmonary nodule 09/2011   Stable appearance  . Tobacco abuse 09/29/2011    Past Surgical History:  Procedure Laterality Date  . CARDIAC CATHETERIZATION  2003   [facility]  . CHOLECYSTECTOMY    . CYSTOSCOPY  09/30/2011   Procedure: CYSTOSCOPY FLEXIBLE;  Surgeon: Marissa Nestle, MD;  Location: [facility];  Service: Urology;  Laterality: N/A;  . FRACTURE SURGERY     R ring finger, pin placed  . TRANSURETHRAL RESECTION OF BLADDER TUMOR  10/01/2011   Procedure: TRANSURETHRAL RESECTION OF BLADDER TUMOR (TURBT);  Surgeon: Marissa Nestle, MD;  Location: [facility];  Service:  Urology;  Laterality: N/A;    Family History  Problem Relation Age of Onset  . Kidney failure Mother   . Emphysema Brother        was not close to family; does not truly know their medical problems.  . Emphysema Brother   . Emphysema Brother   . Cancer Neg Hx     Social History:  reports that he has been smoking cigarettes.  He has a 29.00 pack-year smoking history. he has never used smokeless tobacco. He reports that he does not drink alcohol or use drugs.  Allergies:  Allergies  Allergen Reactions  . Metrizamide Hives  . Iohexol Hives     Code: HIVES, Desc: PT STATES HE BROKE OUT IN HIVES AND RASH AFTER CT NECK EARLY SEPT 2011; NO RESP PROBLEMS; NEEDS PRE-MEDS; MKS, Onset Date: [id number]   . Ivp Dye [Iodinated Diagnostic Agents] Hives    Medications: I have reviewed the patient's current medications.  Results for orders placed or performed during the hospital encounter of 04/22/17 (from the past 48 hour(s))  CBC with Differential     Status: Abnormal   Collection Time: 04/22/17 12:52 PM  Result Value Ref Range   WBC 13.3 (H) 4.0 - 10.5 K/uL   RBC 1.64 (L) 4.22 - 5.81 MIL/uL   Hemoglobin 3.5 (LL) 13.0 - 17.0 g/dL    Comment:  REPEATED TO VERIFY CRITICAL RESULT CALLED TO, READ BACK BY AND VERIFIED WITH: E WILEY AT 1330 BY HFLYNT 04/22/17    HCT 13.2 (L) 39.0 - 52.0 %   MCV 80.5 78.0 - 100.0 fL   MCH 21.3 (L) 26.0 - 34.0 pg   MCHC 26.5 (L) 30.0 - 36.0 g/dL   RDW 19.3 (H) 11.5 - 15.5 %   Platelets 375 150 - 400 K/uL   Neutrophils Relative % 60 %   Neutro Abs 8.1 (H) 1.7 - 7.7 K/uL   Lymphocytes Relative 27 %   Lymphs Abs 3.6 0.7 - 4.0 K/uL   Monocytes Relative 10 %   Monocytes Absolute 1.3 (H) 0.1 - 1.0 K/uL   Eosinophils Relative 2 %   Eosinophils Absolute 0.2 0.0 - 0.7 K/uL   Basophils Relative 1 %   Basophils Absolute 0.1 0.0 - 0.1 K/uL  Comprehensive metabolic panel     Status: Abnormal   Collection Time: 04/22/17 12:52 PM  Result Value Ref Range   Sodium 139  135 - 145 mmol/L   Potassium 3.8 3.5 - 5.1 mmol/L   Chloride 95 (L) 101 - 111 mmol/L   CO2 32 22 - 32 mmol/L   Glucose, Bld 114 (H) 65 - 99 mg/dL   BUN 19 6 - 20 mg/dL   Creatinine, Ser 1.01 0.61 - 1.24 mg/dL   Calcium 9.1 8.9 - 10.3 mg/dL   Total Protein 6.8 6.5 - 8.1 g/dL   Albumin 3.3 (L) 3.5 - 5.0 g/dL   AST 23 15 - 41 U/L   ALT 12 (L) 17 - 63 U/L   Alkaline Phosphatase 81 38 - 126 U/L   Total Bilirubin 0.2 (L) 0.3 - 1.2 mg/dL   GFR calc non Af Amer >60 >60 mL/min   GFR calc Af Amer >60 >60 mL/min    Comment: (NOTE) The eGFR has been calculated using the CKD EPI equation. This calculation has not been validated in all clinical situations. eGFR's persistently <60 mL/min signify possible Chronic Kidney Disease.    Anion gap 12 5 - 15  Troponin I     Status: None   Collection Time: 04/22/17 12:52 PM  Result Value Ref Range   Troponin I <0.03 <0.03 ng/mL  Reticulocytes     Status: Abnormal   Collection Time: 04/22/17 12:59 PM  Result Value Ref Range   Retic Ct Pct 2.6 0.4 - 3.1 %   RBC. 1.65 (L) 4.22 - 5.81 MIL/uL   Retic Count, Absolute 42.9 19.0 - 186.0 K/uL  Type and screen     Status: None (Preliminary result)   Collection Time: 04/22/17  1:59 PM  Result Value Ref Range   ABO/RH(D) O POS    Antibody Screen NEG    Sample Expiration 04/25/2017    Unit Number [id number]    Blood Component Type RED CELLS,LR    Unit division 00    Status of Unit ISSUED,FINAL    Transfusion Status OK TO TRANSFUSE    Crossmatch Result Compatible    Unit Number [id number]    Blood Component Type RED CELLS,LR    Unit division 00    Status of Unit ISSUED,FINAL    Transfusion Status OK TO TRANSFUSE    Crossmatch Result Compatible    Unit Number [id number]    Blood Component Type RED CELLS,LR    Unit division 00    Status of Unit ISSUED,FINAL    Transfusion Status OK TO TRANSFUSE    Crossmatch Result Compatible  Unit Number [id number]    Blood Component Type RED  CELLS,LR    Unit division 00    Status of Unit ALLOCATED    Transfusion Status OK TO TRANSFUSE    Crossmatch Result Compatible    Unit Number [id number]    Blood Component Type RED CELLS,LR    Unit division 00    Status of Unit ALLOCATED    Transfusion Status OK TO TRANSFUSE    Crossmatch Result Compatible   Prepare RBC     Status: None   Collection Time: 04/22/17  1:59 PM  Result Value Ref Range   Order Confirmation ORDER PROCESSED BY BLOOD BANK   Vitamin B12     Status: Abnormal   Collection Time: 04/22/17  2:01 PM  Result Value Ref Range   Vitamin B-12 1,859 (H) 180 - 914 pg/mL    Comment: (NOTE) This assay is not validated for testing neonatal or myeloproliferative syndrome specimens for Vitamin B12 levels. Performed at [facility], [place] [address], [place], [place] [address]   Iron and TIBC     Status: Abnormal   Collection Time: 04/22/17  2:01 PM  Result Value Ref Range   Iron 6 (L) 45 - 182 ug/dL   TIBC 512 (H) 250 - 450 ug/dL   Saturation Ratios 1 (L) 17.9 - 39.5 %   UIBC 506 ug/dL    Comment: Performed at [facility], [place] [address], [place], [place] [address]  Ferritin     Status: Abnormal   Collection Time: 04/22/17  2:01 PM  Result Value Ref Range   Ferritin 5 (L) 24 - 336 ng/mL    Comment: Performed at [facility], [place] [address], [place], [place] [address]  Folate     Status: None   Collection Time: 04/22/17  2:02 PM  Result Value Ref Range   Folate 39.0 >5.9 ng/mL    Comment: Performed at [facility], [place] [address] [place], [place] [address]  Prepare RBC     Status: None   Collection Time: 04/22/17  2:02 PM  Result Value Ref Range   Order Confirmation ORDER PROCESSED BY BLOOD BANK   ABO/Rh     Status: None   Collection Time: 04/22/17  2:02 PM  Result Value Ref Range   ABO/RH(D) O POS   POC occult blood, ED     Status: None   Collection Time: 04/22/17  2:46 PM  Result Value Ref Range   Fecal Occult Bld NEGATIVE  NEGATIVE  Urinalysis, Routine w reflex microscopic     Status: Abnormal   Collection Time: 04/23/17  2:45 AM  Result Value Ref Range   Color, Urine YELLOW YELLOW   APPearance CLOUDY (A) CLEAR   Specific Gravity, Urine 1.011 1.005 - 1.030   pH 6.0 5.0 - 8.0   Glucose, UA NEGATIVE NEGATIVE mg/dL   Hgb urine dipstick LARGE (A) NEGATIVE   Bilirubin Urine NEGATIVE NEGATIVE   Ketones, ur NEGATIVE NEGATIVE mg/dL   Protein, ur 100 (A) NEGATIVE mg/dL   Nitrite NEGATIVE NEGATIVE   Leukocytes, UA TRACE (A) NEGATIVE   RBC / HPF TOO NUMEROUS TO COUNT 0 - 5 RBC/hpf   WBC, UA TOO NUMEROUS TO COUNT 0 - 5 WBC/hpf   Bacteria, UA NONE SEEN NONE SEEN   Squamous Epithelial / LPF 0-5 (A) NONE SEEN   Non Squamous Epithelial 0-5 (A) NONE SEEN  CBC     Status: Abnormal   Collection Time:  04/23/17  4:59 AM  Result Value Ref Range   WBC 13.7 (H) 4.0 - 10.5 K/uL   RBC 2.64 (L) 4.22 - 5.81 MIL/uL   Hemoglobin 6.6 (LL) 13.0 - 17.0 g/dL    Comment: RESULT REPEATED AND VERIFIED CRITICAL RESULT CALLED TO, READ BACK BY AND VERIFIED WITH: AMBURN,A AT 0630 BY HUFFINES,S ON 04/23/17.    HCT 21.3 (L) 39.0 - 52.0 %   MCV 80.7 78.0 - 100.0 fL   MCH 25.0 (L) 26.0 - 34.0 pg   MCHC 31.0 30.0 - 36.0 g/dL   RDW 17.4 (H) 11.5 - 15.5 %   Platelets 320 150 - 400 K/uL  Blood gas, arterial     Status: Abnormal   Collection Time: 04/23/17  6:10 AM  Result Value Ref Range   O2 Content 5.0 L/min   Delivery systems NASAL CANNULA    pH, Arterial 7.481 (H) 7.350 - 7.450   pCO2 arterial 45.6 32.0 - 48.0 mmHg   pO2, Arterial 52.5 (L) 83.0 - 108.0 mmHg   Bicarbonate 31.3 (H) 20.0 - 28.0 mmol/L   Acid-Base Excess 8.0 (H) 0.0 - 2.0 mmol/L   O2 Saturation 87.6 %   Collection site RIGHT RADIAL    Drawn by [id number]    Sample type ARTERIAL    Allens test (pass/fail) PASS PASS    Dg Chest 2 View  Result Date: 04/22/2017 CLINICAL DATA:  COPD.  Shortness of breath. EXAM: CHEST  2 VIEW COMPARISON:  CT 03/02/2016.  Chest x-ray  03/02/2016, 01/20/2014. FINDINGS: Mediastinum hilar structures are normal. Heart size stable. Stable diffuse chronic interstitial prominence noted consistent with chronic interstitial lung disease. No pleural effusion or pneumothorax. Diffuse thoracic cage osteopenia. IMPRESSION: Diffuse chronic interstitial lung disease. No acute alveolar infiltrates identified. Electronically Signed   By: Marcello Moores  Register   On: 04/22/2017 12:22   Dg Chest Port 1 View  Result Date: 04/23/2017 CLINICAL DATA:  Increased shortness of breath.  Asthma.  Smoker. EXAM: PORTABLE CHEST 1 VIEW COMPARISON:  05/02/2017. FINDINGS: Unchanged cardiomediastinal silhouette. Worsening aeration with increasing opacities bilaterally, greater on the RIGHT. Considerations would include pulmonary edema (favored) versus BILATERAL pneumonia. Continued surveillance warranted. No effusion or pneumothorax. Bones unremarkable. IMPRESSION: Increasing BILATERAL pulmonary opacities favored to represent worsening edema. Electronically Signed   By: Staci Righter M.D.   On: 04/23/2017 08:00    ROS Blood pressure (!) 134/58, pulse 94, temperature 99.6 F (37.6 C), temperature source Axillary, resp. rate (!) 30, height 5' 5" (1.651 m), weight 182 lb 15.7 oz (83 kg), SpO2 100 %. Physical Exam  Alert and oriented. Skin warm and dry. Oral mucosa is moist.   . Sclera anicteric, conjunctivae is pink. Thyroid not enlarged. No cervical lymphadenopathy. Bilateral wheezes. Marland Kitchen Heart regular rate and rhythm.  Abdomen is soft. Bowel sounds are positive. No hepatomegaly. No abdominal masses felt. No tenderness.  No edema to lower extremities  .   Assessment/Plan: Anemia. Stool guaiac negative in the ED. Patient has hematuria and has been bleeding for over a week. Has never undergone a colonoscopy. Will discuss with DR. Rehman.  02 dependent.   L  04/23/2017, 8:08 AM

## 2017-04-23 NOTE — Progress Notes (Signed)
Daughter in to visit patient. Obtained PCP and Urology PCP phone numbers. Paged Dr. Roger Shelter. No new orders at this time.

## 2017-04-23 NOTE — Progress Notes (Signed)
Vitals:   04/23/17 1515 04/23/17 1621  BP: (!) 160/106   Pulse:    Resp: (!) 28 18  Temp:  98.9 F (37.2 C)  SpO2: (!) 83% 95%     Patient was seen and examined again this afternoon. His blood pressure mildly elevated, de-satting on O2 via nasal cannula currently O2 sat is 95% on 2-4 L of oxygen via nasal cannula. Currently stable he continues to have frank hematuria CT of abdomen pelvis still pending  I have called his primary urologist Dr. Karenann Cai at [facility]. At [phone] He stated that he was planning for surgery later this month. He recommended continue transfusion stabilizing the patient.  He did not want the patient to be transferred  Were still waiting for neurologist on call who has been consulted.  Were monitoring his H&H closely He is due for another units of packed red blood cells specific include a 5 units of blood since last night.  Shahmehdi MD

## 2017-04-23 NOTE — Progress Notes (Signed)
  I have informed the patient, his family (his sister) side that he is declining. Full code status was confirmed by him.  Also I have called [name] his daughter at [phone], I have left her message  Pulmonologist Dr. Heber [place] has been consulted EICU has been notified Pending CT chest abdomen pelvis ABG   [name] Feria MD

## 2017-04-23 NOTE — Progress Notes (Addendum)
PROGRESS NOTE    Patient: [name] [name]     PCP: [name], MD                    DOB: 1940-09-08            DOA: 04/22/2017 DJS:[id number]             DOS: 04/23/2017, 12:06 PM   Date of Service: the patient was seen and examined on 04/23/2017 Subjective:  She was seen and examined this morning, awake alert oriented no acute distress on 6 L of oxygen, satting greater than 92%. Per nursing staff has been refusing BiPAP Foley catheter noted frank hematuria noted He is in no acute distress.,  Currently comfortable sitting up in chair.  ----------------------------------------------------------------------------------------------------------------------  Brief Narrative:  [name] [name] is a 77 y.o. male with medical history significant for COPD, bladder cancer, HTN, BPH, tobacco abuse.  Patient was brought to the ED from [facility] with complaints of increasing shortness of breath and worsening cough productive of whitish sputum of (per patient and daughter) 2-1/2 weeks duration.  Also reports wheezing.  At baseline patient is on 3-4 L of oxygen at home and still smokes cigarettes.  Patient reports he is on treatment for bladder cancer and is supposed to undergo surgery soon.  He denies seeing blood in his urine. He denies abdominal pain, denies vomiting, denies hematochezia or melena.  Patient denies NSAID use reports use of only Tylenol for pain.  Patient denies ever having colonoscopy.   Assessment & Plan:    Principal Problem:   Severe anemia Active Problems:   Essential hypertension   Bladder tumor   BPH (benign prostatic hyperplasia)   Anxiety   COPD exacerbation (HCC)  COPD exacerbation -refusing BiPAP, currently 6 L of oxygen, satting greater than 90%  shortness of breath, cough  has been improving  Still wheezing Chest x-ray no acute abnormality, diffuse chronic interstitial disease.  Still smokes. -IV Solu-Medrol, will increase dosage from 40-80 mg every 6  hours for now -Duo nebs, scheduled and as needed, Mucomyst treatment added today -Continue with mucolytics Range of antibiotics to p.o. Levaquin - PT eval  Severe anemia-hemoglobin 3.5.  Baseline 10-11. Without appropriate reticulocyte response,  Frank/dark blood noted in Foley cath   At this time denies GI.  Denies NSAID use.  Denies prior endoscopies. Patient has a history of bladder cancer and has had hematuria, urologist consulted appreciate their input Also consulted pending evaluation  Patient was transfused with 3 units of packed red blood cell, hemoglobin proved to 6.6, number 2 units of packed red blood cells has been ordered for transfusion -IV Lasix 20 mg X 2 after each transfusion -Follow up Anemia panel was obtained prior to transfusion   -GI consult- -Urology consult--  Bladder cancer-notes in care everywhere.  Last office visit 04/03/2017 with Dr. Darra Lis.  Assessment of recurrent bladder tumors, history of high-grade urothelial cancer, as pre-emptive and palliative measure , patient agreed to undergo further treatment with cystoscopy ,resection , biopsy pyelogram and possible ureteroscopy and stent placement Currently having frank hematuria and Foley cath Pending urology evaluation -we will appreciate their input  Anxiety-Home medications Xanax 0.5-1 mg 3 times daily, Cymbalta -New home Cymbalta -considering Respiratory status- xanax 0.5 mg BID PRN   DVT prophylaxis: SCds Code Status:Full  Family Communication: Daughter at bedside  disposition Plan: 2-3 days, daughter requests for home health for her father at the time of discharge to  home Consults called: GI, urology. Admission status: Inpatient, telemetry  Greater than 40 minutes was spent seeing patient, reviewing records, care everywhere,  Discussing the case with the patient, and nursing staff.  Drawing plan. He has agreed for further transfusion of packed red blood cells.  Thoracic understanding  and agreement benefits and risks.   Consultants:   Urologist     Procedures:   Antimicrobials:  Anti-infectives (From admission, onward)   Start     Dose/Rate Route Frequency Ordered Stop   04/23/17 1400  azithromycin (ZITHROMAX) 500 mg in dextrose 5 % 250 mL IVPB     500 mg 250 mL/hr over 60 Minutes Intravenous Every 24 hours 04/22/17 1815     04/22/17 1245  azithromycin (ZITHROMAX) 500 mg in dextrose 5 % 250 mL IVPB     500 mg 250 mL/hr over 60 Minutes Intravenous  Once 04/22/17 1240 04/22/17 1420       Objective: Vitals:   04/23/17 1128 04/23/17 1130 04/23/17 1142 04/23/17 1143  BP: (!) 162/90 138/77  128/78  Pulse:    95  Resp: (!) 34 (!) 27 (!) 29   Temp:    99 F (37.2 C)  TempSrc:    Oral  SpO2: 94% 98% 98%   Weight:      Height:        Intake/Output Summary (Last 24 hours) at 04/23/2017 1206 Last data filed at 04/23/2017 0554 Gross per 24 hour  Intake 1916 ml  Output 1150 ml  Net 766 ml   Filed Weights   04/22/17 2007  Weight: 83 kg (182 lb 15.7 oz)    Examination:  General exam: Appears calm and comfortable , on greater than 6 L of oxygen, satting approximately 92% Respiratory system: Positive breath sounds diffusely, mildly wheezing, rhonchi Cardiovascular system: S1 & S2 heard, RRR. No JVD, murmurs, rubs, gallops or clicks. No pedal edema. Gastrointestinal system: Abdomen is nondistended, soft and nontender. No organomegaly or masses felt. Normal bowel sounds heard. Central nervous system: Alert and oriented. No focal neurological deficits. Extremities: Symmetric 5 x 5 power. Skin: No rashes, lesions or ulcers Psychiatry: Judgement and insight appear normal. Mood & affect appropriate.  Foley catheter in place, hematuria noted   Data Reviewed: I have personally reviewed following labs and imaging studies  CBC: Recent Labs  Lab 04/22/17 1252 04/23/17 0459 04/23/17 0916  WBC 13.3* 13.7*  --   NEUTROABS 8.1*  --   --   HGB 3.5* 6.6* 6.6*    HCT 13.2* 21.3* 21.5*  MCV 80.5 80.7  --   PLT 375 320  --    Basic Metabolic Panel: Recent Labs  Lab 04/22/17 1252  NA 139  K 3.8  CL 95*  CO2 32  GLUCOSE 114*  BUN 19  CREATININE 1.01  CALCIUM 9.1   GFR: Estimated Creatinine Clearance: 62.7 mL/min (by C-G formula based on SCr of 1.01 mg/dL). Liver Function Tests: Recent Labs  Lab 04/22/17 1252  AST 23  ALT 12*  ALKPHOS 81  BILITOT 0.2*  PROT 6.8  ALBUMIN 3.3*   No results for input(s): LIPASE, AMYLASE in the last 168 hours. No results for input(s): AMMONIA in the last 168 hours. Coagulation Profile: No results for input(s): INR, PROTIME in the last 168 hours. Cardiac Enzymes: Recent Labs  Lab 04/22/17 1252  TROPONINI <0.03   BNP (last 3 results) No results for input(s): PROBNP in the last 8760 hours. HbA1C: No results for input(s): HGBA1C in the last 72  hours. CBG: No results for input(s): GLUCAP in the last 168 hours. Lipid Profile: No results for input(s): CHOL, HDL, LDLCALC, TRIG, CHOLHDL, LDLDIRECT in the last 72 hours. Thyroid Function Tests: No results for input(s): TSH, T4TOTAL, FREET4, T3FREE, THYROIDAB in the last 72 hours. Anemia Panel: Recent Labs    04/22/17 1259 04/22/17 1401 04/22/17 1402  VITAMINB12  --  1,859*  --   FOLATE  --   --  39.0  FERRITIN  --  5*  --   TIBC  --  512*  --   IRON  --  6*  --   RETICCTPCT 2.6  --   --    Sepsis Labs: No results for input(s): PROCALCITON, LATICACIDVEN in the last 168 hours.  No results found for this or any previous visit (from the past 240 hour(s)).     Radiology Studies: Dg Chest 2 View  Result Date: 04/22/2017 CLINICAL DATA:  COPD.  Shortness of breath. EXAM: CHEST  2 VIEW COMPARISON:  CT 03/02/2016.  Chest x-ray 03/02/2016, 01/20/2014. FINDINGS: Mediastinum hilar structures are normal. Heart size stable. Stable diffuse chronic interstitial prominence noted consistent with chronic interstitial lung disease. No pleural effusion or  pneumothorax. Diffuse thoracic cage osteopenia. IMPRESSION: Diffuse chronic interstitial lung disease. No acute alveolar infiltrates identified. Electronically Signed   By: Marcello Moores  Register   On: 04/22/2017 12:22   Dg Chest Port 1 View  Result Date: 04/23/2017 CLINICAL DATA:  Increased shortness of breath.  Asthma.  Smoker. EXAM: PORTABLE CHEST 1 VIEW COMPARISON:  05/02/2017. FINDINGS: Unchanged cardiomediastinal silhouette. Worsening aeration with increasing opacities bilaterally, greater on the RIGHT. Considerations would include pulmonary edema (favored) versus BILATERAL pneumonia. Continued surveillance warranted. No effusion or pneumothorax. Bones unremarkable. IMPRESSION: Increasing BILATERAL pulmonary opacities favored to represent worsening edema. Electronically Signed   By: Staci Righter M.D.   On: 04/23/2017 08:00    Scheduled Meds: . atorvastatin  20 mg Oral q1800  . DULoxetine  60 mg Oral Daily  . fluticasone furoate-vilanterol  1 puff Inhalation Daily  . furosemide  20 mg Intravenous Q4H  . guaiFENesin-dextromethorphan  10 mL Oral Q6H  . ipratropium-albuterol  3 mL Nebulization Q6H  . loratadine  10 mg Oral q morning - 10a  . methylPREDNISolone (SOLU-MEDROL) injection  60 mg Intravenous Q6H  . pantoprazole  40 mg Oral Daily  . roflumilast  500 mcg Oral q morning - 10a  . umeclidinium bromide  1 puff Inhalation Daily   Continuous Infusions: . sodium chloride    . sodium chloride    . azithromycin       LOS: 1 day    Time spent: >25 minutes   Deatra James, MD [facility] Pager [phone]  If 7PM-7AM, please contact night-coverage [email or web address] Password [email or web address] 04/23/2017, 12:06 PM

## 2017-04-23 NOTE — Progress Notes (Signed)
Dr. [name] paged and made aware that RR was counted for one full minute at 32 breaths per minute. Lung sounds diminished. Waiting for orders/call back

## 2017-04-23 NOTE — Progress Notes (Signed)
Pt refuses to be placed on BiPap, has a hx of not tolerating Bipap well. Respiratory suggested a heated HFNC. Dr. Roger Shelter paged and said to go ahead and place pt on heated HFNC. Respiratory notified

## 2017-04-24 LAB — TYPE AND SCREEN
ABO/RH(D): O POS
Antibody Screen: NEGATIVE
UNIT DIVISION: 0
Unit division: 0
Unit division: 0
Unit division: 0
Unit division: 0

## 2017-04-24 LAB — BPAM RBC
BLOOD PRODUCT EXPIRATION DATE: 201902022359
BLOOD PRODUCT EXPIRATION DATE: 201902032359
BLOOD PRODUCT EXPIRATION DATE: 201902032359
Blood Product Expiration Date: 201902032359
Blood Product Expiration Date: 201902042359
ISSUE DATE / TIME: 201901081551
ISSUE DATE / TIME: 201901082354
ISSUE DATE / TIME: 201901091718
ISSUE DATE / TIME: 201901081955
ISSUE DATE / TIME: 201901091113
UNIT TYPE AND RH: 5100
UNIT TYPE AND RH: 5100
UNIT TYPE AND RH: 5100
UNIT TYPE AND RH: 5100
Unit Type and Rh: 5100

## 2017-04-24 NOTE — Progress Notes (Signed)
[place] consent obtained from daughter, Maryanna Shape via phone at [phone].  Pt left unit via stretcher with ground transport team from. [facility] at approx 2350. No signs or symptoms of any distress. Report called to Sharyn Lull, RN at [facility].

## 2017-04-24 NOTE — Consult Note (Signed)
Consult requested by: [facility], DR. Shahmehdi Consult requested for: Shortness of breath, respiratory failure  HPI: This is a 77 year old who is known to have multiple medical problems including COPD, bladder cancer, hypertension, BPH, continued tobacco abuse chronic hypoxic respiratory failure.  He was sent from an urgent care center because of increasing shortness of breath cough and congestion.  He was wheezing.  He is known to have bladder cancer.  He was found to be tachycardic tachypneic and with hemoglobin of 3.5.  He has required multiple blood transfusions.  He is still having hematuria.  He has had more oxygen requirement and appears to be volume overloaded.  He denies any pain.  He says he is short of breath.  He is coughing mostly nonproductively.  He does not have any chest pain.  No nausea or vomiting.  Past Medical History:  Diagnosis Date  . Anxiety 03/13/2012  . Asthma   . 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  Marland Kitchen BPH (benign prostatic hyperplasia) 10/01/2011   Per cystoscopy  . Chronic anxiety   . Chronic back pain   . COPD (chronic obstructive pulmonary disease) ([place])   . Depression   . Emphysema lung ([place])   . GERD (gastroesophageal reflux disease)   . Headache(784.0)   . History of stomach ulcers   . Hypercholesteremia   . Hyperlipidemia 03/13/2012  . On home O2    2L N/C continuous  . Pulmonary nodule 09/2011   Stable appearance  . Tobacco abuse 09/29/2011     Family History  Problem Relation Age of Onset  . Kidney failure Mother   . Emphysema Brother        was not close to family; does not truly know their medical problems.  . Emphysema Brother   . Emphysema Brother   . Cancer Neg Hx      Social History   Socioeconomic History  . Marital status: Divorced    Spouse name: None  . Number of children: None  . Years of education: None  . Highest education level: None  Social Needs  . Financial resource strain: None  .  Food insecurity - worry: None  . Food insecurity - inability: None  . Transportation needs - medical: None  . Transportation needs - non-medical: None  Occupational History  . None  Tobacco Use  . Smoking status: Current Every Day Smoker    Packs/day: 0.50    Years: 58.00    Pack years: 29.00    Types: Cigarettes  . Smokeless tobacco: Never Used  Substance and Sexual Activity  . Alcohol use: No  . Drug use: No  . Sexual activity: No  Other Topics Concern  . None  Social History Narrative  . None     ROS: Except as mentioned 10 point review of systems is negative    Objective: Vital signs in last 24 hours: Temp:  [98.2 F (36.8 C)-98.9 F (37.2 C)] 98.3 F (36.8 C) (01/09 2300) Pulse Rate:  [97-98] 97 (01/09 1743) Resp:  [18-34] 22 (01/09 2300) BP: (122-190)/(64-106) 143/90 (01/09 2300) SpO2:  [81 %-97 %] 93 % (01/09 2300) FiO2 (%):  [40 %-60 %] 50 % (01/09 2034) Weight change:  Last BM Date: 04/23/17  Intake/Output from previous day: 01/09 0701 - 01/10 0700 In: 640 [P.O.:640] Out: 2700 [Urine:2700]  PHYSICAL EXAM Constitutional: He is awake and alert and in mild to moderate distress.  He is wearing high flow nasal oxygen.  Eyes: Pupils react.  EOMI.  Ears nose mouth and throat: Hearing is grossly normal.  Throat is clear.  Cardiovascular: His heart is regular with tachycardia and no gallop now.  Respiratory: His respiratory effort is increased.  He has rales bilaterally.  Gastrointestinal: His abdomen is soft with no masses.  Skin: Warm and dry.  Neurological: No focal abnormalities.  Musculoskeletal: Strength is grossly normal psychiatric: He seems a little anxious  Lab Results: Basic Metabolic Panel: Recent Labs    04/22/17 1252  NA 139  K 3.8  CL 95*  CO2 32  GLUCOSE 114*  BUN 19  CREATININE 1.01  CALCIUM 9.1   Liver Function Tests: Recent Labs    04/22/17 1252  AST 23  ALT 12*  ALKPHOS 81  BILITOT 0.2*  PROT 6.8  ALBUMIN 3.3*   No results  for input(s): LIPASE, AMYLASE in the last 72 hours. No results for input(s): AMMONIA in the last 72 hours. CBC: Recent Labs    04/22/17 1252 04/23/17 0459  04/23/17 1531 04/23/17 2108  WBC 13.3* 13.7*  --   --   --   NEUTROABS 8.1*  --   --   --   --   HGB 3.5* 6.6*   < > 8.2* 9.0*  HCT 13.2* 21.3*   < > 25.9* 28.3*  MCV 80.5 80.7  --   --   --   PLT 375 320  --   --   --    < > = values in this interval not displayed.   Cardiac Enzymes: Recent Labs    04/22/17 1252  TROPONINI <0.03   BNP: No results for input(s): PROBNP in the last 72 hours. D-Dimer: No results for input(s): DDIMER in the last 72 hours. CBG: No results for input(s): GLUCAP in the last 72 hours. Hemoglobin A1C: No results for input(s): HGBA1C in the last 72 hours. Fasting Lipid Panel: No results for input(s): CHOL, HDL, LDLCALC, TRIG, CHOLHDL, LDLDIRECT in the last 72 hours. Thyroid Function Tests: No results for input(s): TSH, T4TOTAL, FREET4, T3FREE, THYROIDAB in the last 72 hours. Anemia Panel: Recent Labs    04/22/17 1259 04/22/17 1401 04/22/17 1402  VITAMINB12  --  1,859*  --   FOLATE  --   --  39.0  FERRITIN  --  5*  --   TIBC  --  512*  --   IRON  --  6*  --   RETICCTPCT 2.6  --   --    Coagulation: No results for input(s): LABPROT, INR in the last 72 hours. Urine Drug Screen: Drugs of Abuse  No results found for: LABOPIA, COCAINSCRNUR, LABBENZ, AMPHETMU, THCU, LABBARB  Alcohol Level: No results for input(s): ETH in the last 72 hours. Urinalysis: Recent Labs    04/23/17 0245  COLORURINE YELLOW  LABSPEC 1.011  PHURINE 6.0  GLUCOSEU NEGATIVE  HGBUR LARGE*  BILIRUBINUR NEGATIVE  KETONESUR NEGATIVE  PROTEINUR 100*  NITRITE NEGATIVE  LEUKOCYTESUR TRACE*   Misc. Labs:   ABGS: Recent Labs    04/23/17 1756  PHART 7.433  PO2ART 82.8*  HCO3 31.9*     MICROBIOLOGY: Recent Results (from the past 240 hour(s))  MRSA PCR Screening     Status: None   Collection Time:  04/23/17  7:02 AM  Result Value Ref Range Status   MRSA by PCR NEGATIVE NEGATIVE Final    Comment:        The GeneXpert MRSA Assay (FDA approved for NASAL specimens only), is one component of a  comprehensive MRSA colonization surveillance program. It is not intended to diagnose MRSA infection nor to guide or monitor treatment for MRSA infections.     Studies/Results: Ct Abdomen Pelvis Wo Contrast  Result Date: 04/23/2017 CLINICAL DATA:  Bladder cancer with hematuria and shortness of breath. Pulmonary edema suspected. History of asthma and COPD. EXAM: CT CHEST, ABDOMEN AND PELVIS WITHOUT CONTRAST TECHNIQUE: Multidetector CT imaging of the chest, abdomen and pelvis was performed following the standard protocol without IV contrast. COMPARISON:  Chest radiographs 04/23/2017. Chest CT 03/02/2016 and abdominopelvic CT 04/01/2017. FINDINGS: CT CHEST FINDINGS Cardiovascular: Atherosclerosis of the aorta, great vessels and coronary arteries. No acute vascular findings are apparent on noncontrast imaging. The heart size is normal. There is no pericardial effusion. Mediastinum/Nodes: There are no enlarged mediastinal, hilar or axillary lymph nodes. Small mediastinal lymph nodes are stable. Hilar assessment is limited by the lack of intravenous contrast, although the hilar contours appear unchanged. The thyroid gland, trachea and esophagus appear unremarkable. Lungs/Pleura: No pleural effusion or pneumothorax. Moderate to severe centrilobular emphysema is again noted with scattered parenchymal scarring. Right upper lobe nodularity is grossly stable with a 1.8 x 1.1 cm component on image 40. There is a small component on image 45. 6 mm right middle lobe nodule on image 107 is stable from 2017. 5 mm lingular nodule on image 103 is also unchanged. Musculoskeletal/Chest wall: No chest wall mass or suspicious osseous findings. Old rib fracture on the right. Incomplete healing of previously demonstrated posterior  left rib fracture. CT ABDOMEN AND PELVIS FINDINGS Hepatobiliary: The liver appears unremarkable as imaged in the noncontrast state. The gallbladder is incompletely distended. No evidence of gallstones, gallbladder wall thickening or biliary dilatation. Pancreas: Unremarkable. No pancreatic ductal dilatation or surrounding inflammatory changes. Spleen: Normal in size without focal abnormality. Adrenals/Urinary Tract: Both adrenal glands appear normal. Evaluation of the kidneys is mildly limited by breathing artifact. There is renal cortical scarring bilaterally. There is a tiny calculus in the lower pole of the right kidney. 8 mm exophytic lesion from the lower pole of the left kidney on image 74 is grossly unchanged from recent abdominal CT. No evidence of ureteral calculus or hydronephrosis. A Foley catheter is in place. There is persistent irregular bladder wall thickening with heterogeneous filling defects in the bladder lumen. This is likely due to a combination of known bladder tumor and hemorrhage. Stomach/Bowel: No evidence of bowel wall thickening, distention or surrounding inflammatory change. The appendix appears normal. Vascular/Lymphatic: There are no enlarged abdominal or pelvic lymph nodes. There is aortic and branch vessel atherosclerosis. No acute vascular findings identified. Reproductive: Mild enlargement of the prostate gland. The prostate gland was difficult to separate from the bladder tumor on previous contrast study, not well evaluated on the current non-contrast exam. Other: No ascites, free air or focal extraluminal fluid collection. Musculoskeletal: No acute or significant osseous findings. IMPRESSION: 1. Persistent irregular bladder wall thickening with intraluminal filling defects, similar to previous study of 04/01/2017, and likely representing a combination of bladder cancer and hemorrhage. Foley catheter is in place. 2. Of note, this process appears contiguous with the prostate gland  on previous contrast enhanced study. Prostate neoplasm is possible as well. 3. No evidence of ureteral obstruction or ureteral calculus. Tiny right renal calculus. 4. No acute chest findings. Severe emphysema with stable bilateral pulmonary nodularity. Electronically Signed   By: Richardean Sale M.D.   On: 04/23/2017 20:38   Ct Chest Wo Contrast  Result Date: 04/23/2017 CLINICAL DATA:  Bladder  cancer with hematuria and shortness of breath. Pulmonary edema suspected. History of asthma and COPD. EXAM: CT CHEST, ABDOMEN AND PELVIS WITHOUT CONTRAST TECHNIQUE: Multidetector CT imaging of the chest, abdomen and pelvis was performed following the standard protocol without IV contrast. COMPARISON:  Chest radiographs 04/23/2017. Chest CT 03/02/2016 and abdominopelvic CT 04/01/2017. FINDINGS: CT CHEST FINDINGS Cardiovascular: Atherosclerosis of the aorta, great vessels and coronary arteries. No acute vascular findings are apparent on noncontrast imaging. The heart size is normal. There is no pericardial effusion. Mediastinum/Nodes: There are no enlarged mediastinal, hilar or axillary lymph nodes. Small mediastinal lymph nodes are stable. Hilar assessment is limited by the lack of intravenous contrast, although the hilar contours appear unchanged. The thyroid gland, trachea and esophagus appear unremarkable. Lungs/Pleura: No pleural effusion or pneumothorax. Moderate to severe centrilobular emphysema is again noted with scattered parenchymal scarring. Right upper lobe nodularity is grossly stable with a 1.8 x 1.1 cm component on image 40. There is a small component on image 45. 6 mm right middle lobe nodule on image 107 is stable from 2017. 5 mm lingular nodule on image 103 is also unchanged. Musculoskeletal/Chest wall: No chest wall mass or suspicious osseous findings. Old rib fracture on the right. Incomplete healing of previously demonstrated posterior left rib fracture. CT ABDOMEN AND PELVIS FINDINGS Hepatobiliary: The  liver appears unremarkable as imaged in the noncontrast state. The gallbladder is incompletely distended. No evidence of gallstones, gallbladder wall thickening or biliary dilatation. Pancreas: Unremarkable. No pancreatic ductal dilatation or surrounding inflammatory changes. Spleen: Normal in size without focal abnormality. Adrenals/Urinary Tract: Both adrenal glands appear normal. Evaluation of the kidneys is mildly limited by breathing artifact. There is renal cortical scarring bilaterally. There is a tiny calculus in the lower pole of the right kidney. 8 mm exophytic lesion from the lower pole of the left kidney on image 74 is grossly unchanged from recent abdominal CT. No evidence of ureteral calculus or hydronephrosis. A Foley catheter is in place. There is persistent irregular bladder wall thickening with heterogeneous filling defects in the bladder lumen. This is likely due to a combination of known bladder tumor and hemorrhage. Stomach/Bowel: No evidence of bowel wall thickening, distention or surrounding inflammatory change. The appendix appears normal. Vascular/Lymphatic: There are no enlarged abdominal or pelvic lymph nodes. There is aortic and branch vessel atherosclerosis. No acute vascular findings identified. Reproductive: Mild enlargement of the prostate gland. The prostate gland was difficult to separate from the bladder tumor on previous contrast study, not well evaluated on the current non-contrast exam. Other: No ascites, free air or focal extraluminal fluid collection. Musculoskeletal: No acute or significant osseous findings. IMPRESSION: 1. Persistent irregular bladder wall thickening with intraluminal filling defects, similar to previous study of 04/01/2017, and likely representing a combination of bladder cancer and hemorrhage. Foley catheter is in place. 2. Of note, this process appears contiguous with the prostate gland on previous contrast enhanced study. Prostate neoplasm is possible as  well. 3. No evidence of ureteral obstruction or ureteral calculus. Tiny right renal calculus. 4. No acute chest findings. Severe emphysema with stable bilateral pulmonary nodularity. Electronically Signed   By: Richardean Sale M.D.   On: 04/23/2017 20:38   Dg Chest Port 1 View  Result Date: 04/23/2017 CLINICAL DATA:  Increased shortness of breath.  Asthma.  Smoker. EXAM: PORTABLE CHEST 1 VIEW COMPARISON:  05/02/2017. FINDINGS: Unchanged cardiomediastinal silhouette. Worsening aeration with increasing opacities bilaterally, greater on the RIGHT. Considerations would include pulmonary edema (favored) versus BILATERAL pneumonia. Continued  surveillance warranted. No effusion or pneumothorax. Bones unremarkable. IMPRESSION: Increasing BILATERAL pulmonary opacities favored to represent worsening edema. Electronically Signed   By: Staci Righter M.D.   On: 04/23/2017 08:00    Medications:  Prior to Admission:  No medications prior to admission.   Scheduled:  Continuous:  PRN:  Assesment: He was admitted with severe anemia.  He has a bladder tumor and it was being planned for him to have a bladder resection.  He has apparently been bleeding for some time.  He has required multiple blood transfusions and I think he is volume overloaded now.  He is receiving Lasix.  At baseline he has pretty severe COPD and he is still smoking.  He has COPD exacerbation which is actually the original complaint and his anemia was found later.  He has chronic hypoxic respiratory failure.  His hemoglobin level has improved and the last one was 8.2 but he is still having bleeding.  He is scheduled for CT abdomen and pelvis and chest  Initial chest x-ray more consistent with interstitial lung disease chest x-ray this morning looks more like he had volume overload.  I have personally reviewed both films.  CT chest is pending. Principal Problem:   Severe anemia Active Problems:   Essential hypertension   Bladder tumor   BPH  (benign prostatic hyperplasia)   Anxiety   COPD exacerbation ([facility])    Plan: Continue blood replacement.  Continue Lasix.  Await results of CTs.  He does agree that he would like to be intubated and placed on mechanical ventilation if it came to that.  We could try BiPAP first.  He is high risk for needing intubation and mechanical ventilation considering his ongoing issues with bleeding.    LOS: 1 day   Milynn Quirion L 04/24/2017, 2:11 PM

## 2017-06-21 ENCOUNTER — Inpatient Hospital Stay (HOSPITAL_COMMUNITY)
Admission: EM | Admit: 2017-06-21 | Discharge: 2017-06-30 | DRG: 190 | Disposition: A | Discharge status: Skilled Nursing Facility | Payer: Medicare Other | Attending: Internal Medicine | Admitting: Internal Medicine

## 2017-06-21 ENCOUNTER — Other Ambulatory Visit: Payer: Self-pay

## 2017-06-21 ENCOUNTER — Emergency Department (HOSPITAL_COMMUNITY): Payer: Medicare Other

## 2017-06-21 ENCOUNTER — Encounter (HOSPITAL_COMMUNITY): Payer: Self-pay | Admitting: Emergency Medicine

## 2017-06-21 DIAGNOSIS — C689 Malignant neoplasm of urinary organ, unspecified: Secondary | ICD-10-CM | POA: Diagnosis not present

## 2017-06-21 DIAGNOSIS — Z9981 Dependence on supplemental oxygen: Secondary | ICD-10-CM

## 2017-06-21 DIAGNOSIS — J441 Chronic obstructive pulmonary disease with (acute) exacerbation: Secondary | ICD-10-CM | POA: Diagnosis present

## 2017-06-21 DIAGNOSIS — F419 Anxiety disorder, unspecified: Secondary | ICD-10-CM | POA: Diagnosis present

## 2017-06-21 DIAGNOSIS — F17210 Nicotine dependence, cigarettes, uncomplicated: Secondary | ICD-10-CM | POA: Diagnosis present

## 2017-06-21 DIAGNOSIS — T380X5A Adverse effect of glucocorticoids and synthetic analogues, initial encounter: Secondary | ICD-10-CM | POA: Diagnosis present

## 2017-06-21 DIAGNOSIS — R0602 Shortness of breath: Secondary | ICD-10-CM | POA: Diagnosis present

## 2017-06-21 DIAGNOSIS — I10 Essential (primary) hypertension: Secondary | ICD-10-CM | POA: Diagnosis not present

## 2017-06-21 DIAGNOSIS — Z8711 Personal history of peptic ulcer disease: Secondary | ICD-10-CM | POA: Diagnosis not present

## 2017-06-21 DIAGNOSIS — J439 Emphysema, unspecified: Principal | ICD-10-CM | POA: Diagnosis present

## 2017-06-21 DIAGNOSIS — Z720 Tobacco use: Secondary | ICD-10-CM | POA: Diagnosis not present

## 2017-06-21 DIAGNOSIS — Z7951 Long term (current) use of inhaled steroids: Secondary | ICD-10-CM | POA: Diagnosis not present

## 2017-06-21 DIAGNOSIS — K219 Gastro-esophageal reflux disease without esophagitis: Secondary | ICD-10-CM | POA: Diagnosis present

## 2017-06-21 DIAGNOSIS — D494 Neoplasm of unspecified behavior of bladder: Secondary | ICD-10-CM | POA: Diagnosis not present

## 2017-06-21 DIAGNOSIS — C679 Malignant neoplasm of bladder, unspecified: Secondary | ICD-10-CM | POA: Diagnosis present

## 2017-06-21 DIAGNOSIS — N138 Other obstructive and reflux uropathy: Secondary | ICD-10-CM | POA: Diagnosis present

## 2017-06-21 DIAGNOSIS — E785 Hyperlipidemia, unspecified: Secondary | ICD-10-CM | POA: Diagnosis not present

## 2017-06-21 DIAGNOSIS — J9621 Acute and chronic respiratory failure with hypoxia: Secondary | ICD-10-CM | POA: Diagnosis not present

## 2017-06-21 DIAGNOSIS — R739 Hyperglycemia, unspecified: Secondary | ICD-10-CM | POA: Diagnosis present

## 2017-06-21 DIAGNOSIS — Z66 Do not resuscitate: Secondary | ICD-10-CM | POA: Diagnosis not present

## 2017-06-21 DIAGNOSIS — R319 Hematuria, unspecified: Secondary | ICD-10-CM | POA: Diagnosis present

## 2017-06-21 DIAGNOSIS — F329 Major depressive disorder, single episode, unspecified: Secondary | ICD-10-CM | POA: Diagnosis present

## 2017-06-21 DIAGNOSIS — N179 Acute kidney failure, unspecified: Secondary | ICD-10-CM | POA: Diagnosis not present

## 2017-06-21 DIAGNOSIS — Z825 Family history of asthma and other chronic lower respiratory diseases: Secondary | ICD-10-CM | POA: Diagnosis not present

## 2017-06-21 DIAGNOSIS — E869 Volume depletion, unspecified: Secondary | ICD-10-CM | POA: Diagnosis present

## 2017-06-21 DIAGNOSIS — Z91041 Radiographic dye allergy status: Secondary | ICD-10-CM

## 2017-06-21 DIAGNOSIS — E7800 Pure hypercholesterolemia, unspecified: Secondary | ICD-10-CM | POA: Diagnosis present

## 2017-06-21 DIAGNOSIS — E782 Mixed hyperlipidemia: Secondary | ICD-10-CM | POA: Diagnosis not present

## 2017-06-21 DIAGNOSIS — J9690 Respiratory failure, unspecified, unspecified whether with hypoxia or hypercapnia: Secondary | ICD-10-CM | POA: Diagnosis present

## 2017-06-21 LAB — CBC WITH DIFFERENTIAL/PLATELET
BASOS ABS: 0 10*3/uL (ref 0.0–0.1)
BASOS PCT: 0 %
EOS ABS: 0 10*3/uL (ref 0.0–0.7)
EOS PCT: 0 %
HEMATOCRIT: 28 % — AB (ref 39.0–52.0)
Hemoglobin: 8.2 g/dL — ABNORMAL LOW (ref 13.0–17.0)
Lymphocytes Relative: 10 %
Lymphs Abs: 1.9 10*3/uL (ref 0.7–4.0)
MCH: 24.4 pg — ABNORMAL LOW (ref 26.0–34.0)
MCHC: 29.3 g/dL — AB (ref 30.0–36.0)
MCV: 83.3 fL (ref 78.0–100.0)
MONO ABS: 0.7 10*3/uL (ref 0.1–1.0)
MONOS PCT: 4 %
Neutro Abs: 16 10*3/uL — ABNORMAL HIGH (ref 1.7–7.7)
Neutrophils Relative %: 86 %
PLATELETS: 524 10*3/uL — AB (ref 150–400)
RBC: 3.36 MIL/uL — ABNORMAL LOW (ref 4.22–5.81)
RDW: 17.3 % — AB (ref 11.5–15.5)
WBC: 18.6 10*3/uL — ABNORMAL HIGH (ref 4.0–10.5)

## 2017-06-21 LAB — URINALYSIS, ROUTINE W REFLEX MICROSCOPIC
BILIRUBIN URINE: NEGATIVE
GLUCOSE, UA: NEGATIVE mg/dL
Ketones, ur: NEGATIVE mg/dL
Nitrite: NEGATIVE
Specific Gravity, Urine: >1.03 — ABNORMAL HIGH (ref 1.005–1.030)
pH: 6.5 (ref 5.0–8.0)

## 2017-06-21 LAB — COMPREHENSIVE METABOLIC PANEL
ALBUMIN: 3.6 g/dL (ref 3.5–5.0)
ALK PHOS: 79 U/L (ref 38–126)
ALT: 21 U/L (ref 17–63)
ANION GAP: 11 (ref 5–15)
AST: 29 U/L (ref 15–41)
BILIRUBIN TOTAL: 0.5 mg/dL (ref 0.3–1.2)
BUN: 22 mg/dL — AB (ref 6–20)
CALCIUM: 9.6 mg/dL (ref 8.9–10.3)
CO2: 28 mmol/L (ref 22–32)
Chloride: 94 mmol/L — ABNORMAL LOW (ref 101–111)
Creatinine, Ser: 1.41 mg/dL — ABNORMAL HIGH (ref 0.61–1.24)
GFR calc Af Amer: 55 mL/min — ABNORMAL LOW (ref >60)
GFR calc non Af Amer: 47 mL/min — ABNORMAL LOW (ref >60)
GLUCOSE: 155 mg/dL — AB (ref 65–99)
Potassium: 4.4 mmol/L (ref 3.5–5.1)
SODIUM: 133 mmol/L — AB (ref 135–145)
TOTAL PROTEIN: 8 g/dL (ref 6.5–8.1)

## 2017-06-21 LAB — BRAIN NATRIURETIC PEPTIDE: B NATRIURETIC PEPTIDE 5: 49 pg/mL (ref 0.0–100.0)

## 2017-06-21 LAB — TROPONIN I

## 2017-06-21 LAB — URINALYSIS, MICROSCOPIC (REFLEX)

## 2017-06-21 MED ORDER — SIMVASTATIN 20 MG PO TABS
40.0000 mg | ORAL_TABLET | Freq: Every day | ORAL | Status: DC
  Administered 2017-06-22 – 2017-06-29 (×8): 40 mg via ORAL
  Filled 2017-06-21 (×9): qty 2

## 2017-06-21 MED ORDER — ALBUTEROL SULFATE (2.5 MG/3ML) 0.083% IN NEBU
INHALATION_SOLUTION | RESPIRATORY_TRACT | Status: AC
  Administered 2017-06-21: 2.5 mg via RESPIRATORY_TRACT
  Filled 2017-06-21: qty 3

## 2017-06-21 MED ORDER — ACETAMINOPHEN 325 MG PO TABS
650.0000 mg | ORAL_TABLET | Freq: Four times a day (QID) | ORAL | Status: DC | PRN
  Administered 2017-06-23 – 2017-06-29 (×8): 650 mg via ORAL
  Filled 2017-06-21 (×8): qty 2

## 2017-06-21 MED ORDER — SODIUM CHLORIDE 0.9 % IV SOLN
INTRAVENOUS | Status: DC
  Administered 2017-06-22 – 2017-06-26 (×3): via INTRAVENOUS

## 2017-06-21 MED ORDER — IPRATROPIUM-ALBUTEROL 0.5-2.5 (3) MG/3ML IN SOLN
3.0000 mL | RESPIRATORY_TRACT | Status: DC
  Administered 2017-06-21 (×2): 3 mL via RESPIRATORY_TRACT
  Filled 2017-06-21 (×2): qty 3

## 2017-06-21 MED ORDER — IPRATROPIUM-ALBUTEROL 0.5-2.5 (3) MG/3ML IN SOLN
3.0000 mL | Freq: Four times a day (QID) | RESPIRATORY_TRACT | Status: DC
  Administered 2017-06-22 – 2017-06-30 (×32): 3 mL via RESPIRATORY_TRACT
  Filled 2017-06-21 (×33): qty 3

## 2017-06-21 MED ORDER — DULOXETINE HCL 60 MG PO CPEP
60.0000 mg | ORAL_CAPSULE | Freq: Every day | ORAL | Status: DC
  Administered 2017-06-22 – 2017-06-30 (×9): 60 mg via ORAL
  Filled 2017-06-21 (×9): qty 1

## 2017-06-21 MED ORDER — ONDANSETRON HCL 4 MG PO TABS: 4.0000 mg | ORAL_TABLET | Freq: Four times a day (QID) | ORAL | Status: DC | PRN

## 2017-06-21 MED ORDER — SODIUM CHLORIDE 0.9 % IV BOLUS (SEPSIS)
1000.0000 mL | Freq: Once | INTRAVENOUS | Status: AC
Start: 2017-06-21 — End: 2017-06-21
  Administered 2017-06-21: 1000 mL via INTRAVENOUS

## 2017-06-21 MED ORDER — ALPRAZOLAM 1 MG PO TABS
1.0000 mg | ORAL_TABLET | Freq: Three times a day (TID) | ORAL | Status: DC | PRN
  Administered 2017-06-22 – 2017-06-30 (×20): 1 mg via ORAL
  Filled 2017-06-21 (×21): qty 1

## 2017-06-21 MED ORDER — DOCUSATE SODIUM 100 MG PO CAPS
100.0000 mg | ORAL_CAPSULE | Freq: Every day | ORAL | Status: DC | PRN
  Administered 2017-06-30: 100 mg via ORAL
  Filled 2017-06-21: qty 1

## 2017-06-21 MED ORDER — IPRATROPIUM-ALBUTEROL 0.5-2.5 (3) MG/3ML IN SOLN
RESPIRATORY_TRACT | Status: AC
  Administered 2017-06-21: 3 mL via RESPIRATORY_TRACT
  Filled 2017-06-21: qty 3

## 2017-06-21 MED ORDER — SULFAMETHOXAZOLE-TRIMETHOPRIM 800-160 MG PO TABS
1.0000 | ORAL_TABLET | Freq: Two times a day (BID) | ORAL | Status: DC
  Administered 2017-06-22 – 2017-06-23 (×4): 1 via ORAL
  Filled 2017-06-21 (×5): qty 1

## 2017-06-21 MED ORDER — ALBUTEROL SULFATE (2.5 MG/3ML) 0.083% IN NEBU
2.5000 mg | INHALATION_SOLUTION | Freq: Once | RESPIRATORY_TRACT | Status: AC
  Administered 2017-06-21: 2.5 mg via RESPIRATORY_TRACT

## 2017-06-21 MED ORDER — PANTOPRAZOLE SODIUM 40 MG PO TBEC
40.0000 mg | DELAYED_RELEASE_TABLET | Freq: Every day | ORAL | Status: DC
  Administered 2017-06-22 – 2017-06-30 (×9): 40 mg via ORAL
  Filled 2017-06-21 (×9): qty 1

## 2017-06-21 MED ORDER — IPRATROPIUM-ALBUTEROL 0.5-2.5 (3) MG/3ML IN SOLN
3.0000 mL | Freq: Four times a day (QID) | RESPIRATORY_TRACT | Status: DC
  Administered 2017-06-21: 3 mL via RESPIRATORY_TRACT

## 2017-06-21 MED ORDER — METHYLPREDNISOLONE SODIUM SUCC 125 MG IJ SOLR
60.0000 mg | Freq: Four times a day (QID) | INTRAMUSCULAR | Status: DC
  Administered 2017-06-22 – 2017-06-24 (×11): 60 mg via INTRAVENOUS
  Filled 2017-06-21 (×11): qty 2

## 2017-06-21 MED ORDER — ONDANSETRON HCL 4 MG/2ML IJ SOLN: 4.0000 mg | Freq: Four times a day (QID) | INTRAMUSCULAR | Status: DC | PRN

## 2017-06-21 NOTE — ED Notes (Signed)
Pt tolerating bipap well. States he feels much better after in& out and bipap. Pt resting quietly at this time. Continues to have mildly labored breathing.

## 2017-06-21 NOTE — ED Notes (Signed)
BLADDER SCAN PRIOR TO CATH MEASURED GREATER THAN 999ML

## 2017-06-21 NOTE — ED Notes (Signed)
Per Dr. Vanita Panda, he wants this pt to be placed on a NRB. Documentation requested by respiratory.

## 2017-06-21 NOTE — ED Triage Notes (Signed)
Pt c/o SHOB all day today. EMS gave DUO Neb and solu-medrol 125mg  IV. Pt also c/o no being able urinate earlier today but states he can at this time. Pt denies any chest pain at this time.

## 2017-06-21 NOTE — Progress Notes (Signed)
Pt taken off BIPAP and placed on NRB spo2 100% rr 24 RN informed

## 2017-06-21 NOTE — ED Notes (Signed)
Attempted to help pt to the side of the bed to urinate. Pt was unable to urinate. Pt will need a urinary catheter.

## 2017-06-21 NOTE — ED Provider Notes (Signed)
[facility] EMERGENCY DEPARTMENT Provider Note   CSN: [id number] Arrival date & time: 06/21/17  1608     History   Chief Complaint Chief Complaint  Patient presents with  . Shortness of Breath    HPI [name] [name] is a 77 y.o. male.  HPI Patient presents in respiratory distress. He acknowledges multiple medical issues, states that he has had difficulty breathing for at least the past day, not improved with multiple home albuterol sessions. EMS notes that on their arrival the patient was dyspneic, tachypneic, hypoxic. In route he received at least 4 breathing treatments, Solu-Medrol, without appreciable change in his condition. Patient was intolerant of CPAP in route. No report of fever, vomiting, chest pain. Patient also complains of difficulty with urination, acknowledges history of bladder cancer.  On he states that he has had frank blood in his meatus, has had difficulty initiating a urinary stream recently.  Past Medical History:  Diagnosis Date  . Anxiety 03/13/2012  . Asthma   . 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  Marland Kitchen BPH (benign prostatic hyperplasia) 10/01/2011   Per cystoscopy  . Chronic anxiety   . Chronic back pain   . COPD (chronic obstructive pulmonary disease) ([place])   . Depression   . Emphysema lung ([place])   . GERD (gastroesophageal reflux disease)   . Headache(784.0)   . History of stomach ulcers   . Hypercholesteremia   . Hyperlipidemia 03/13/2012  . On home O2    2L N/C continuous  . Pulmonary nodule 09/2011   Stable appearance  . Tobacco abuse 09/29/2011    Patient Active Problem List   Diagnosis Date Noted  . Severe anemia 04/22/2017  . Shortness of breath   . Acute on chronic respiratory failure ([place]) 10/24/2015  . Pressure ulcer 09/25/2015  . Respiratory failure ([place]) 09/24/2015  . Acute on chronic respiratory failure with hypercapnia ([place])   . Frequent falls 09/21/2015  . HLD (hyperlipidemia) 08/18/2015  . Chronic  anemia 08/18/2015  . COPD exacerbation ([place]) 08/18/2015  . Chronic pain syndrome 05/01/2015  . Chronic respiratory failure with hypoxia ([place])   . COPD (chronic obstructive pulmonary disease) ([place]) 02/09/2014  . Hyperglycemia, drug-induced 03/26/2013  . Chronic back pain   . Anxiety 03/13/2012  . Hyperlipidemia 03/13/2012  . BPH (benign prostatic hyperplasia) 10/01/2011  . Tobacco abuse 09/29/2011  . Bladder tumor 09/28/2011  . PULMONARY NODULE 12/27/2009  . Essential hypertension 12/26/2009  . OSTEOARTHRITIS 12/26/2009    Past Surgical History:  Procedure Laterality Date  . CARDIAC CATHETERIZATION  2003   [facility]  . CHOLECYSTECTOMY    . CYSTOSCOPY  09/30/2011   Procedure: CYSTOSCOPY FLEXIBLE;  Surgeon: Marissa Nestle, MD;  Location: [facility];  Service: Urology;  Laterality: N/A;  . FRACTURE SURGERY     R ring finger, pin placed  . TRANSURETHRAL RESECTION OF BLADDER TUMOR  10/01/2011   Procedure: TRANSURETHRAL RESECTION OF BLADDER TUMOR (TURBT);  Surgeon: Marissa Nestle, MD;  Location: [facility];  Service: Urology;  Laterality: N/A;       Home Medications    Prior to Admission medications   Medication Sig Start Date End Date Taking? Authorizing Provider  acetaminophen (TYLENOL) 325 MG tablet Take 650 mg by mouth every 6 (six) hours as needed for headache.   Yes [provider]  albuterol (PROVENTIL) (2.5 MG/3ML) 0.083% nebulizer solution Take 3 mLs (2.5 mg total) by nebulization every 4 (four) hours as needed. For shortness of breath 11/28/15  Yes Timmothy Euler, MD  ALPRAZolam Duanne Moron) 1 MG tablet Take 1 mg by mouth 3 (three) times daily as needed for anxiety or sleep. 03/03/17  Yes [provider]  cyanocobalamin 500 MCG tablet Take 500 mcg by mouth every morning.   Yes [provider]  docusate sodium (COLACE) 100 MG capsule Take 100 mg by mouth daily as needed for mild constipation or moderate constipation.    Yes [provider]    DULoxetine (CYMBALTA) 60 MG capsule Take 1 capsule (60 mg total) by mouth daily. 04/24/17  Yes Shahmehdi, Seyed A, MD  fluticasone furoate-vilanterol (BREO ELLIPTA) 200-25 MCG/INH AEPB Inhale 1 puff into the lungs daily. 04/24/17  Yes Shahmehdi, Seyed A, MD  guaiFENesin-dextromethorphan (ROBITUSSIN DM) 100-10 MG/5ML syrup Take 10 mLs by mouth every 6 (six) hours. 04/23/17  Yes Shahmehdi, Valeria Batman, MD  Multiple Vitamin (MULTIVITAMIN WITH MINERALS) TABS tablet Take 1 tablet by mouth daily.   Yes [provider]  nitrofurantoin, macrocrystal-monohydrate, (MACROBID) 100 MG capsule Take 100 mg by mouth 2 (two) times daily.   Yes [provider]  NITROSTAT 0.4 MG SL tablet Place 1 tablet under the tongue every 5 (five) minutes as needed for chest pain.  02/22/13  Yes [provider]  omeprazole (PRILOSEC) 20 MG capsule Take 20 mg by mouth daily. 11/11/16  Yes [provider]  prochlorperazine (COMPAZINE) 10 MG tablet Take 1 hour before each days radiation treatment for nausea 06/18/17  Yes [provider]  roflumilast (DALIRESP) 500 MCG TABS tablet Take 1 Tablet by mouth once daily 11/11/16  Yes [provider]  simvastatin (ZOCOR) 40 MG tablet Take 40 mg by mouth at bedtime.    Yes [provider]  sulfamethoxazole-trimethoprim (BACTRIM DS,SEPTRA DS) 800-160 MG tablet Take 1 tablet by mouth 2 (two) times daily. 7 day course starting on 06/19/2017 06/19/17 06/26/17 Yes [provider]  umeclidinium bromide (INCRUSE ELLIPTA) 62.5 MCG/INH AEPB Inhale 1 puff into the lungs daily. 01/01/16  Yes Hawks, Christy A, FNP  VENTOLIN HFA 108 (90 Base) MCG/ACT inhaler Inhale 2 puffs into the lungs every 6 (six) hours as needed for wheezing or shortness of breath. 12/14/15  Yes Timmothy Euler, MD  HYDROcodone-acetaminophen (NORCO/VICODIN) 5-325 MG tablet Take 1 tablet by mouth every 6 (six) hours as needed for severe pain. Patient not taking: Reported on  04/22/2017 03/02/16   Carmin Muskrat, MD  levofloxacin (LEVAQUIN) 500 MG tablet Take 1 tablet (500 mg total) by mouth daily. Patient not taking: Reported on 06/21/2017 04/24/17   ShahmehdiValeria Batman, MD  LORazepam (ATIVAN) 0.5 MG tablet Take 1 tablet (0.5 mg total) by mouth every 6 (six) hours as needed for anxiety. 04/23/17   Shahmehdi, Valeria Batman, MD  methylPREDNISolone sodium succinate (SOLU-MEDROL) 125 mg/2 mL injection Inject 0.96 mLs (60 mg total) into the vein every 6 (six) hours. Patient not taking: Reported on 06/21/2017 04/23/17   Deatra James, MD    Family History Family History  Problem Relation Age of Onset  . Kidney failure Mother   . Emphysema Brother        was not close to family; does not truly know their medical problems.  . Emphysema Brother   . Emphysema Brother   . Cancer Neg Hx     Social History Social History   Tobacco Use  . Smoking status: Current Every Day Smoker    Packs/day: 0.50    Years: 58.00    Pack years: 29.00  Types: Cigarettes  . Smokeless tobacco: Never Used  Substance Use Topics  . Alcohol use: No  . Drug use: No     Allergies   Metrizamide; Iohexol; and Ivp dye [iodinated diagnostic agents]   Review of Systems Review of Systems  Constitutional:       Per HPI, otherwise negative  HENT:       Per HPI, otherwise negative  Respiratory:       Per HPI, otherwise negative  Cardiovascular:       Per HPI, otherwise negative  Gastrointestinal: Negative for vomiting.  Endocrine:       Negative aside from HPI  Genitourinary:       Neg aside from HPI   Musculoskeletal:       Per HPI, otherwise negative  Skin: Negative.   Neurological: Negative for syncope.     Physical Exam Updated Vital Signs BP 129/73   Pulse 93   Temp 98.7 F (37.1 C) (Oral)   Resp (!) 32   Wt 82.6 kg (182 lb)   SpO2 93%   BMI 30.29 kg/m   Physical Exam  Constitutional: He is oriented to person, place, and time. He appears well-developed. He has a  sickly appearance. He appears distressed.  HENT:  Head: Normocephalic and atraumatic.  Eyes: Conjunctivae and EOM are normal.  Cardiovascular: Normal rate and regular rhythm.  Pulmonary/Chest: Accessory muscle usage present. Tachypnea noted. He is in respiratory distress. He has decreased breath sounds. He has wheezes.  Abdominal: He exhibits no distension.  Genitourinary: Uncircumcised.     Musculoskeletal: He exhibits no edema.  Neurological: He is alert and oriented to person, place, and time.  Skin: Skin is warm and dry.  Psychiatric: He has a normal mood and affect.  Nursing note and vitals reviewed.    ED Treatments / Results  Labs (all labs ordered are listed, but only abnormal results are displayed) Labs Reviewed  COMPREHENSIVE METABOLIC PANEL - Abnormal; Notable for the following components:      Result Value   Sodium 133 (*)    Chloride 94 (*)    Glucose, Bld 155 (*)    BUN 22 (*)    Creatinine, Ser 1.41 (*)    GFR calc non Af Amer 47 (*)    GFR calc Af Amer 55 (*)    All other components within normal limits  CBC WITH DIFFERENTIAL/PLATELET - Abnormal; Notable for the following components:   WBC 18.6 (*)    RBC 3.36 (*)    Hemoglobin 8.2 (*)    HCT 28.0 (*)    MCH 24.4 (*)    MCHC 29.3 (*)    RDW 17.3 (*)    Platelets 524 (*)    Neutro Abs 16.0 (*)    All other components within normal limits  URINALYSIS, ROUTINE W REFLEX MICROSCOPIC - Abnormal; Notable for the following components:   Color, Urine RED (*)    APPearance CLOUDY (*)    Specific Gravity, Urine >1.030 (*)    Hgb urine dipstick LARGE (*)    Protein, ur >300 (*)    Leukocytes, UA TRACE (*)    All other components within normal limits  URINALYSIS, MICROSCOPIC (REFLEX) - Abnormal; Notable for the following components:   Bacteria, UA MANY (*)    Squamous Epithelial / LPF 6-30 (*)    All other components within normal limits  TROPONIN I  BRAIN NATRIURETIC PEPTIDE    EKG  EKG  Interpretation  Date/Time:  Saturday June 21 2017 16:35:19 EST Ventricular Rate:  113 PR Interval:    QRS Duration: 90 QT Interval:  343 QTC Calculation: 471 R Axis:   101 Text Interpretation:  Sinus tachycardia Right axis deviation Low voltage, extremity and precordial leads Artifact T wave abnormality Abnormal ekg Confirmed by Carmin Muskrat [phone]) on 06/21/2017 5:38:07 PM       Radiology Dg Chest Port 1 View  Result Date: 06/21/2017 CLINICAL DATA:  SOB, pt on c-pap mask EXAM: PORTABLE CHEST 1 VIEW COMPARISON:  04/23/2017 FINDINGS: Cardiac silhouette is normal in size. No mediastinal or hilar masses. There are prominent bronchovascular markings which are similar to prior exams. There is a possible component of interstitial edema. No evidence of pneumonia. No pleural effusion or pneumothorax. Skeletal structures are grossly intact. IMPRESSION: 1. Prominent bronchovascular markings similar to prior exams, likely chronic. A component of interstitial edema should be considered given the reported shortness of breath. No evidence of pneumonia. Electronically Signed   By: Lajean Manes M.D.   On: 06/21/2017 16:48    Procedures Procedures (including critical care time)  Medications Ordered in ED Medications  ipratropium-albuterol (DUONEB) 0.5-2.5 (3) MG/3ML nebulizer solution 3 mL (not administered)  sodium chloride 0.9 % bolus 1,000 mL (not administered)  albuterol (PROVENTIL) (2.5 MG/3ML) 0.083% nebulizer solution 2.5 mg (2.5 mg Nebulization Given 06/21/17 1640)     Initial Impression / Assessment and Plan / ED Course  I have reviewed the triage vital signs and the nursing notes.  Pertinent labs & imaging results that were available during my care of the patient were reviewed by me and considered in my medical decision making (see chart for details).     Update: Patient has been on BiPAP for greater than 2 hours, after arriving in respiratory distress. Heart rate has now decreased  below 100, he is substantially more calm. Family members are here as well, they note that the patient continues to smoke cigarettes, in spite of his known COPD. As a note the patient is supposed to start chemotherapy for his bladder cancer again, in 2 days.  Chart reviewed after the initial evaluation, with urology notes from CT scan as below1/19:   CT scan:  CONCLUSION:  1. Diffuse bladder wall thickening with focal area of enhancement along the anterior bladder wall. This could represent an area of residual tumor or an area of increased enhancement secondary to recent resection. No CT evidence of extension of tumor beyond the bladder wall. No enlarged pelvic lymph nodes. No CT evidence for distant metastasis. 2. Though this study is somewhat limited by absence of delayed excretory phase images, no definite abnormal enhancement seen in bilateral renal collecting system or in the ureter to suggest urothelial lesions. 3. Minimal increase in the size of a right upper lobe pulmonary nodule as described above, given the presence of calcification, this nodule is statistically benign. Other ancillary findings as above.     7:20 PM Labs consistent with COPD exacerbation, no x-ray evidence for obvious pneumonia, and the patient is afebrile.  The patient is Artie received Solu-Medrol, multiple breathing treatments, BiPAP, will require admission for further evaluation and management.    7:55 PM Patient has transitioned from BiPAP to nonrebreather mask, with oxygen saturation 98%. Patient sleeping, fatigue, but awakens easily, briefly. He continues to receive IV fluids, as there is lab evidence for dehydration. Final Clinical Impressions(s) / ED Diagnoses  Respiratory distress COPD exacerbation  CRITICAL CARE Performed by: Carmin Muskrat Total critical care time: 35 minutes Critical  care time was exclusive of separately billable procedures and treating other patients. Critical care was  necessary to treat or prevent imminent or life-threatening deterioration. Critical care was time spent personally by me on the following activities: development of treatment plan with patient and/or surrogate as well as nursing, discussions with consultants, evaluation of patient's response to treatment, examination of patient, obtaining history from patient or surrogate, ordering and performing treatments and interventions, ordering and review of laboratory studies, ordering and review of radiographic studies, pulse oximetry and re-evaluation of patient's condition.    Carmin Muskrat, MD 06/21/17 [phone]

## 2017-06-21 NOTE — H&P (Signed)
TRH H&P    Patient Demographics:    [name] [name], is a 77 y.o. male  MRN: [id number]  DOB - 04/12/1941  Admit Date - 06/21/2017  Referring MD/NP/PA:  Carmin Muskrat  Outpatient Primary MD for the patient is [name], MD  Patient coming from: Home  Chief complaint-  Shortness of breath   HPI:    [name] [name]  is a 77 y.o. male, with history of cancer, chemotherapy scheduled on Monday as outpatient, COPD on chronic home O2, hyperlipidemia, depression came to hospital with worsening shortness of breath.  Patient says that shortness of breath did not improve with albuterol nebulizer at home.  Patient also complains of inability to urinate, has history of bladder cancer.  In the ED patient was found to be in urinary retention, Foley catheter was placed.  Patient was initially put on BiPAP which was later removed, currently O2 sats 99% on nonrebreather He denies chest pain, No nausea vomiting or diarrhea. No headache or blurred vision. Complains of dysuria, hematuria     Review of systems:      All other systems reviewed and are negative.   With Past History of the following :    Past Medical History:  Diagnosis Date  . Anxiety 03/13/2012  . Asthma   . 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  Marland Kitchen BPH (benign prostatic hyperplasia) 10/01/2011   Per cystoscopy  . Chronic anxiety   . Chronic back pain   . COPD (chronic obstructive pulmonary disease) ([place])   . Depression   . Emphysema lung ([place])   . GERD (gastroesophageal reflux disease)   . Headache(784.0)   . History of stomach ulcers   . Hypercholesteremia   . Hyperlipidemia 03/13/2012  . On home O2    2L N/C continuous  . Pulmonary nodule 09/2011   Stable appearance  . Tobacco abuse 09/29/2011      Past Surgical History:  Procedure Laterality Date  . CARDIAC CATHETERIZATION  2003   [facility]  . CHOLECYSTECTOMY    .  CYSTOSCOPY  09/30/2011   Procedure: CYSTOSCOPY FLEXIBLE;  Surgeon: Marissa Nestle, MD;  Location: [facility];  Service: Urology;  Laterality: N/A;  . FRACTURE SURGERY     R ring finger, pin placed  . TRANSURETHRAL RESECTION OF BLADDER TUMOR  10/01/2011   Procedure: TRANSURETHRAL RESECTION OF BLADDER TUMOR (TURBT);  Surgeon: Marissa Nestle, MD;  Location: [facility];  Service: Urology;  Laterality: N/A;      Social History:      Social History   Tobacco Use  . Smoking status: Current Every Day Smoker    Packs/day: 0.50    Years: 58.00    Pack years: 29.00    Types: Cigarettes  . Smokeless tobacco: Never Used  Substance Use Topics  . Alcohol use: No       Family History :     Family History  Problem Relation Age of Onset  . Kidney failure Mother   . Emphysema Brother  was not close to family; does not truly know their medical problems.  . Emphysema Brother   . Emphysema Brother   . Cancer Neg Hx      Home Medications:   Prior to Admission medications   Medication Sig Start Date End Date Taking? Authorizing Provider  acetaminophen (TYLENOL) 325 MG tablet Take 650 mg by mouth every 6 (six) hours as needed for headache.   Yes [provider]  albuterol (PROVENTIL) (2.5 MG/3ML) 0.083% nebulizer solution Take 3 mLs (2.5 mg total) by nebulization every 4 (four) hours as needed. For shortness of breath 11/28/15  Yes Timmothy Euler, MD  ALPRAZolam Duanne Moron) 1 MG tablet Take 1 mg by mouth 3 (three) times daily as needed for anxiety or sleep. 03/03/17  Yes [provider]  cyanocobalamin 500 MCG tablet Take 500 mcg by mouth every morning.   Yes [provider]  docusate sodium (COLACE) 100 MG capsule Take 100 mg by mouth daily as needed for mild constipation or moderate constipation.    Yes [provider]  DULoxetine (CYMBALTA) 60 MG capsule Take 1 capsule (60 mg total) by mouth daily. 04/24/17  Yes Shahmehdi, Seyed A, MD  fluticasone  furoate-vilanterol (BREO ELLIPTA) 200-25 MCG/INH AEPB Inhale 1 puff into the lungs daily. 04/24/17  Yes Shahmehdi, Seyed A, MD  guaiFENesin-dextromethorphan (ROBITUSSIN DM) 100-10 MG/5ML syrup Take 10 mLs by mouth every 6 (six) hours. 04/23/17  Yes Shahmehdi, Valeria Batman, MD  Multiple Vitamin (MULTIVITAMIN WITH MINERALS) TABS tablet Take 1 tablet by mouth daily.   Yes [provider]  nitrofurantoin, macrocrystal-monohydrate, (MACROBID) 100 MG capsule Take 100 mg by mouth 2 (two) times daily.   Yes [provider]  NITROSTAT 0.4 MG SL tablet Place 1 tablet under the tongue every 5 (five) minutes as needed for chest pain.  02/22/13  Yes [provider]  omeprazole (PRILOSEC) 20 MG capsule Take 20 mg by mouth daily. 11/11/16  Yes [provider]  prochlorperazine (COMPAZINE) 10 MG tablet Take 1 hour before each days radiation treatment for nausea 06/18/17  Yes [provider]  roflumilast (DALIRESP) 500 MCG TABS tablet Take 1 Tablet by mouth once daily 11/11/16  Yes [provider]  simvastatin (ZOCOR) 40 MG tablet Take 40 mg by mouth at bedtime.    Yes [provider]  sulfamethoxazole-trimethoprim (BACTRIM DS,SEPTRA DS) 800-160 MG tablet Take 1 tablet by mouth 2 (two) times daily. 7 day course starting on 06/19/2017 06/19/17 06/26/17 Yes [provider]  umeclidinium bromide (INCRUSE ELLIPTA) 62.5 MCG/INH AEPB Inhale 1 puff into the lungs daily. 01/01/16  Yes Hawks, Christy A, FNP  VENTOLIN HFA 108 (90 Base) MCG/ACT inhaler Inhale 2 puffs into the lungs every 6 (six) hours as needed for wheezing or shortness of breath. 12/14/15  Yes Timmothy Euler, MD  HYDROcodone-acetaminophen (NORCO/VICODIN) 5-325 MG tablet Take 1 tablet by mouth every 6 (six) hours as needed for severe pain. Patient not taking: Reported on 04/22/2017 03/02/16   Carmin Muskrat, MD  levofloxacin (LEVAQUIN) 500 MG tablet Take 1 tablet (500 mg total) by mouth daily. Patient  not taking: Reported on 06/21/2017 04/24/17   ShahmehdiValeria Batman, MD  LORazepam (ATIVAN) 0.5 MG tablet Take 1 tablet (0.5 mg total) by mouth every 6 (six) hours as needed for anxiety. 04/23/17   Shahmehdi, Valeria Batman, MD  methylPREDNISolone sodium succinate (SOLU-MEDROL) 125 mg/2 mL injection Inject 0.96 mLs (60 mg total) into the vein every 6 (six) hours. Patient not taking: Reported  on 06/21/2017 04/23/17   Deatra James, MD     Allergies:     Allergies  Allergen Reactions  . Metrizamide Hives  . Iohexol Hives     Code: HIVES, Desc: PT STATES HE BROKE OUT IN HIVES AND RASH AFTER CT NECK EARLY SEPT 2011; NO RESP PROBLEMS; NEEDS PRE-MEDS; MKS, Onset Date: [id number]   . Ivp Dye [Iodinated Diagnostic Agents] Hives     Physical Exam:   Vitals  Blood pressure (!) 165/83, pulse (!) 104, temperature 98.7 F (37.1 C), temperature source Oral, resp. rate (!) 31, weight 82.6 kg (182 lb), SpO2 99 %.  1.  General: Appears in no acute distress  2. Psychiatric:  Intact judgement and  insight, awake alert, oriented x 3.  3. Neurologic: No focal neurological deficits, all cranial nerves intact.Strength 5/5 all 4 extremities, sensation intact all 4 extremities, plantars down going.  4. Eyes :  anicteric sclerae, moist conjunctivae with no lid lag. PERRLA.  5. ENMT:  Oropharynx clear with moist mucous membranes and good dentition  6. Neck:  supple, no cervical lymphadenopathy appriciated, No thyromegaly  7. Respiratory : Normal respiratory effort, bilateral wheezing  8. Cardiovascular : RRR, no gallops, rubs or murmurs, no leg edema  9. Gastrointestinal:  Positive bowel sounds, abdomen soft, non-tender to palpation,no hepatosplenomegaly, no rigidity or guarding       10. Skin:  No cyanosis, normal texture and turgor, no rash, lesions or ulcers  11.Musculoskeletal:  Good muscle tone,  joints appear normal , no effusions,  normal range of motion    Data Review:    CBC Recent Labs    Lab 06/21/17 1657  WBC 18.6*  HGB 8.2*  HCT 28.0*  PLT 524*  MCV 83.3  MCH 24.4*  MCHC 29.3*  RDW 17.3*  LYMPHSABS 1.9  MONOABS 0.7  EOSABS 0.0  BASOSABS 0.0   ------------------------------------------------------------------------------------------------------------------  Chemistries  Recent Labs  Lab 06/21/17 1657  NA 133*  K 4.4  CL 94*  CO2 28  GLUCOSE 155*  BUN 22*  CREATININE 1.41*  CALCIUM 9.6  AST 29  ALT 21  ALKPHOS 79  BILITOT 0.5   ------------------------------------------------------------------------------------------------------------------  ------------------------------------------------------------------------------------------------------------------ GFR: Estimated Creatinine Clearance: 44.8 mL/min (A) (by C-G formula based on SCr of 1.41 mg/dL (H)). Liver Function Tests: Recent Labs  Lab 06/21/17 1657  AST 29  ALT 21  ALKPHOS 79  BILITOT 0.5  PROT 8.0  ALBUMIN 3.6   No results for input(s): LIPASE, AMYLASE in the last 168 hours. No results for input(s): AMMONIA in the last 168 hours. Coagulation Profile: No results for input(s): INR, PROTIME in the last 168 hours. Cardiac Enzymes: Recent Labs  Lab 06/21/17 1657  TROPONINI <0.03   --------------------------------------------------------------------------------------------------------------- Urine analysis:    Component Value Date/Time   COLORURINE RED (A) 06/21/2017 1629   APPEARANCEUR CLOUDY (A) 06/21/2017 1629   APPEARANCEUR Turbid (A) 01/25/2016 1407   LABSPEC >1.030 (H) 06/21/2017 1629   PHURINE 6.5 06/21/2017 1629   GLUCOSEU NEGATIVE 06/21/2017 1629   HGBUR LARGE (A) 06/21/2017 1629   BILIRUBINUR NEGATIVE 06/21/2017 1629   BILIRUBINUR Negative 01/25/2016 1407   KETONESUR NEGATIVE 06/21/2017 1629   PROTEINUR >300 (A) 06/21/2017 1629   UROBILINOGEN 0.2 05/04/2014 0956   NITRITE NEGATIVE 06/21/2017 1629   LEUKOCYTESUR TRACE (A) 06/21/2017 1629   LEUKOCYTESUR 3+ (A)  01/25/2016 1407      Imaging Results:    Dg Chest Port 1 View  Result Date: 06/21/2017 CLINICAL DATA:  SOB, pt on  c-pap mask EXAM: PORTABLE CHEST 1 VIEW COMPARISON:  04/23/2017 FINDINGS: Cardiac silhouette is normal in size. No mediastinal or hilar masses. There are prominent bronchovascular markings which are similar to prior exams. There is a possible component of interstitial edema. No evidence of pneumonia. No pleural effusion or pneumothorax. Skeletal structures are grossly intact. IMPRESSION: 1. Prominent bronchovascular markings similar to prior exams, likely chronic. A component of interstitial edema should be considered given the reported shortness of breath. No evidence of pneumonia. Electronically Signed   By: Lajean Manes M.D.   On: 06/21/2017 16:48    My personal review of EKG: Rhythm NSR    Assessment & Plan:    Active Problems:   Essential hypertension   Bladder tumor   Tobacco abuse   Hyperlipidemia   COPD exacerbation (HCC)   Respiratory failure (HCC)   Hematuria  1. COPD exacerbation-start Solu-Medrol 60 mg IV every 6 hours, DuoNeb nebulizers every 6 hours.   2. Hematuria-patient has a history of bladder cancer, and is supposed to undergo chemotherapy on Monday.Patient was started on Bactrim on 06/19/2017 empirically for UTI for 7 days.  Continue Bactrim.  Will obtain urine culture 3. Urinary retention-patient had significant urinary retention, Foley catheter has been placed.  Likely will need to be discharged with Foley catheter in place 4. Acute kidney injury-patient's creatinine elevated to 1.41, baseline creatinine is around 1.  Likely obstructive uropathy.  Follow BMP in a.m.  Will obtain renal ultrasound.    DVT Prophylaxis-    SCDs   AM Labs Ordered, also please review Full Orders  Family Communication: Admission, patients condition and plan of care including tests being ordered have been discussed with the patient and his son and daughter at bedside who  indicate understanding and agree with the plan and Code Status.  Code Status:  Full code  Admission status: Inpatient  Time spent in minutes : 60 min   Oswald Hillock M.D on 06/21/2017 at 10:01 PM  Between 7am to 7pm - Pager - [phone]. After 7pm go to [email or web address] - password [facility]  [facility] - Office  [phone]

## 2017-06-22 ENCOUNTER — Other Ambulatory Visit: Payer: Self-pay

## 2017-06-22 ENCOUNTER — Inpatient Hospital Stay (HOSPITAL_COMMUNITY): Payer: Medicare Other

## 2017-06-22 LAB — MRSA PCR SCREENING: MRSA BY PCR: NEGATIVE

## 2017-06-22 LAB — COMPREHENSIVE METABOLIC PANEL
ALBUMIN: 3.1 g/dL — AB (ref 3.5–5.0)
ALK PHOS: 66 U/L (ref 38–126)
ALT: 14 U/L — ABNORMAL LOW (ref 17–63)
ANION GAP: 10 (ref 5–15)
AST: 19 U/L (ref 15–41)
BILIRUBIN TOTAL: 0.6 mg/dL (ref 0.3–1.2)
BUN: 21 mg/dL — ABNORMAL HIGH (ref 6–20)
CALCIUM: 9.6 mg/dL (ref 8.9–10.3)
CO2: 28 mmol/L (ref 22–32)
Chloride: 99 mmol/L — ABNORMAL LOW (ref 101–111)
Creatinine, Ser: 1.02 mg/dL (ref 0.61–1.24)
GLUCOSE: 177 mg/dL — AB (ref 65–99)
Potassium: 5.2 mmol/L — ABNORMAL HIGH (ref 3.5–5.1)
Sodium: 137 mmol/L (ref 135–145)
TOTAL PROTEIN: 7 g/dL (ref 6.5–8.1)

## 2017-06-22 LAB — CBC
HEMATOCRIT: 25.7 % — AB (ref 39.0–52.0)
HEMOGLOBIN: 7.6 g/dL — AB (ref 13.0–17.0)
MCH: 24.5 pg — AB (ref 26.0–34.0)
MCHC: 29.6 g/dL — AB (ref 30.0–36.0)
MCV: 82.9 fL (ref 78.0–100.0)
Platelets: 473 10*3/uL — ABNORMAL HIGH (ref 150–400)
RBC: 3.1 MIL/uL — ABNORMAL LOW (ref 4.22–5.81)
RDW: 17.5 % — ABNORMAL HIGH (ref 11.5–15.5)
WBC: 11.3 10*3/uL — ABNORMAL HIGH (ref 4.0–10.5)

## 2017-06-22 LAB — INFLUENZA PANEL BY PCR (TYPE A & B)
Influenza A By PCR: NEGATIVE
Influenza B By PCR: NEGATIVE

## 2017-06-22 MED ORDER — ORAL CARE MOUTH RINSE
15.0000 mL | Freq: Two times a day (BID) | OROMUCOSAL | Status: DC
  Administered 2017-06-22 – 2017-06-30 (×16): 15 mL via OROMUCOSAL

## 2017-06-22 NOTE — Progress Notes (Signed)
PROGRESS NOTE    [name] [name]  FTD:[id number] DOB: 1940/09/02 DOA: 06/21/2017 PCP: [name], MD     Brief Narrative:  77 year old man admitted from home on 3/9 due to shortness of breath.  Has a history of COPD.     Assessment & Plan:   Active Problems:   Essential hypertension   Bladder tumor   Tobacco abuse   Hyperlipidemia   COPD exacerbation (HCC)   Respiratory failure (HCC)   Acute on chronic hypoxemic respiratory failure -Due to COPD with acute exacerbation. -Influenza PCR has resulted negative. -Chest x-ray without evidence for pneumonia. -Still with significant expiratory wheezes on lung auscultation. -We will leave steroids at current dose of Solu-Medrol 60 mg IV every 6 hours, continue as needed breathing treatments.  UTI -Continue Bactrim, culture pending.  This was started in the outpatient setting.  Urinary retention -Foley catheter has been placed. -Will need outpatient follow-up with urology for voiding trial.  Acute renal failure -Creatinine on admission was 1.4, has normalized to 1.02, suspect mainly postobstructive due to urinary retention.   DVT prophylaxis: SCDs Code Status: Full code Family Communication: Daughter via phone updated on plan of care and questions answered Disposition Plan: Home once medically stable anticipate 48 hours  Consultants:   None  Procedures:   None  Antimicrobials:  Anti-infectives (From admission, onward)   Start     Dose/Rate Route Frequency Ordered Stop   06/22/17 0000  sulfamethoxazole-trimethoprim (BACTRIM DS,SEPTRA DS) 800-160 MG per tablet 1 tablet    Comments:  7 day course starting on 06/19/2017     1 tablet Oral 2 times daily 06/21/17 2349         Subjective: Still short of breath although improved from admission, concerned about hematuria  Objective: Vitals:   06/22/17 0327 06/22/17 0551 06/22/17 0900 06/22/17 1435  BP:  108/65  113/63  Pulse:  (!) 101  (!) 102  Resp:  (!) 22  (!)  22  Temp:  98.2 F (36.8 C)  98 F (36.7 C)  TempSrc:  Oral    SpO2: 95% 94% 90% 95%  Weight:        Intake/Output Summary (Last 24 hours) at 06/22/2017 1505 Last data filed at 06/22/2017 1300 Gross per 24 hour  Intake 2069.33 ml  Output 4450 ml  Net -2380.67 ml   Filed Weights   06/21/17 1611  Weight: 82.6 kg (182 lb)    Examination:  General exam: Alert, awake, oriented x 3 Respiratory system: Fair air movement, good inspiratory effort, diffuse expiratory wheezing Cardiovascular system:RRR. No murmurs, rubs, gallops. Gastrointestinal system: Abdomen is nondistended, soft and nontender. No organomegaly or masses felt. Normal bowel sounds heard. Central nervous system: Alert and oriented. No focal neurological deficits. Extremities: No C/C/E, +pedal pulses Skin: No rashes, lesions or ulcers Psychiatry: Judgement and insight appear normal. Mood & affect appropriate.     Data Reviewed: I have personally reviewed following labs and imaging studies  CBC: Recent Labs  Lab 06/21/17 1657 06/22/17 0610  WBC 18.6* 11.3*  NEUTROABS 16.0*  --   HGB 8.2* 7.6*  HCT 28.0* 25.7*  MCV 83.3 82.9  PLT 524* 062*   Basic Metabolic Panel: Recent Labs  Lab 06/21/17 1657 06/22/17 0610  NA 133* 137  K 4.4 5.2*  CL 94* 99*  CO2 28 28  GLUCOSE 155* 177*  BUN 22* 21*  CREATININE 1.41* 1.02  CALCIUM 9.6 9.6   GFR: Estimated Creatinine Clearance: 61.9 mL/min (by C-G formula based on  SCr of 1.02 mg/dL). Liver Function Tests: Recent Labs  Lab 06/21/17 1657 06/22/17 0610  AST 29 19  ALT 21 14*  ALKPHOS 79 66  BILITOT 0.5 0.6  PROT 8.0 7.0  ALBUMIN 3.6 3.1*   No results for input(s): LIPASE, AMYLASE in the last 168 hours. No results for input(s): AMMONIA in the last 168 hours. Coagulation Profile: No results for input(s): INR, PROTIME in the last 168 hours. Cardiac Enzymes: Recent Labs  Lab 06/21/17 1657  TROPONINI <0.03   BNP (last 3 results) No results for  input(s): PROBNP in the last 8760 hours. HbA1C: No results for input(s): HGBA1C in the last 72 hours. CBG: No results for input(s): GLUCAP in the last 168 hours. Lipid Profile: No results for input(s): CHOL, HDL, LDLCALC, TRIG, CHOLHDL, LDLDIRECT in the last 72 hours. Thyroid Function Tests: No results for input(s): TSH, T4TOTAL, FREET4, T3FREE, THYROIDAB in the last 72 hours. Anemia Panel: No results for input(s): VITAMINB12, FOLATE, FERRITIN, TIBC, IRON, RETICCTPCT in the last 72 hours. Urine analysis:    Component Value Date/Time   COLORURINE RED (A) 06/21/2017 1629   APPEARANCEUR CLOUDY (A) 06/21/2017 1629   APPEARANCEUR Turbid (A) 01/25/2016 1407   LABSPEC >1.030 (H) 06/21/2017 1629   PHURINE 6.5 06/21/2017 1629   GLUCOSEU NEGATIVE 06/21/2017 1629   HGBUR LARGE (A) 06/21/2017 1629   BILIRUBINUR NEGATIVE 06/21/2017 1629   BILIRUBINUR Negative 01/25/2016 1407   KETONESUR NEGATIVE 06/21/2017 1629   PROTEINUR >300 (A) 06/21/2017 1629   UROBILINOGEN 0.2 05/04/2014 0956   NITRITE NEGATIVE 06/21/2017 1629   LEUKOCYTESUR TRACE (A) 06/21/2017 1629   LEUKOCYTESUR 3+ (A) 01/25/2016 1407   Sepsis Labs: @LABRCNTIP (procalcitonin:4,lacticidven:4)  )No results found for this or any previous visit (from the past 240 hour(s)).       Radiology Studies: US Renal  Result Date: 06/22/2017 CLINICAL DATA:  Acute kidney injury. History of bladder tumor resection in 2013. EXAM: RENAL / URINARY TRACT ULTRASOUND COMPLETE COMPARISON:  CT abdomen dated 04/01/2017. FINDINGS: Right Kidney: Length: 10.8 cm. Echogenicity within normal limits. Benign cyst again noted within the midpole region, measuring 1.5 cm. No suspicious mass or hydronephrosis visualized. Left Kidney: Length: 10.6 cm. Echogenicity within normal limits. Small benign cyst again noted within the lower pole, measuring 1 cm. No suspicious mass or hydronephrosis visualized. Bladder: Bladder decompressed by Foley catheter. IMPRESSION: 1. No  acute or significant findings. Both kidneys demonstrate normal cortical thickness and echogenicity. Small bilateral renal cysts, as also described on CT of 04/01/2017. 2. Bladder decompressed by Foley catheter. Electronically Signed   By: Franki Cabot M.D.   On: 06/22/2017 12:14   Dg Chest Port 1 View  Result Date: 06/21/2017 CLINICAL DATA:  SOB, pt on c-pap mask EXAM: PORTABLE CHEST 1 VIEW COMPARISON:  04/23/2017 FINDINGS: Cardiac silhouette is normal in size. No mediastinal or hilar masses. There are prominent bronchovascular markings which are similar to prior exams. There is a possible component of interstitial edema. No evidence of pneumonia. No pleural effusion or pneumothorax. Skeletal structures are grossly intact. IMPRESSION: 1. Prominent bronchovascular markings similar to prior exams, likely chronic. A component of interstitial edema should be considered given the reported shortness of breath. No evidence of pneumonia. Electronically Signed   By: Lajean Manes M.D.   On: 06/21/2017 16:48        Scheduled Meds: . DULoxetine  60 mg Oral Daily  . ipratropium-albuterol  3 mL Nebulization Q6H  . mouth rinse  15 mL Mouth Rinse BID  .  methylPREDNISolone (SOLU-MEDROL) injection  60 mg Intravenous Q6H  . pantoprazole  40 mg Oral Daily  . simvastatin  40 mg Oral QHS  . sulfamethoxazole-trimethoprim  1 tablet Oral BID   Continuous Infusions: . sodium chloride 10 mL/hr at 06/22/17 0040     LOS: 1 day    Time spent: 25 minutes. Greater than 50% of this time was spent in direct contact with the patient coordinating care.     Lelon Frohlich, MD [facility] Pager [phone]  If 7PM-7AM, please contact night-coverage [email or web address] Password [email or web address] 06/22/2017, 3:05 PM

## 2017-06-22 NOTE — Progress Notes (Signed)
penis noted to be oozing small moderate amount of blood around foley - MD aware.  Will continue to monitor.

## 2017-06-23 LAB — URINE CULTURE

## 2017-06-23 MED ORDER — NICOTINE 21 MG/24HR TD PT24
21.0000 mg | MEDICATED_PATCH | Freq: Every day | TRANSDERMAL | Status: DC
  Administered 2017-06-23 – 2017-06-30 (×8): 21 mg via TRANSDERMAL
  Filled 2017-06-23 (×8): qty 1

## 2017-06-23 NOTE — Progress Notes (Signed)
[facility] PROGRESS NOTE  [name] [name] [name] RCV:[id number] DOB: [birth year] DOA: 06/21/2017 PCP: [name], MD   Interim summary and HPI: 77 yr old male came to the ED on 06/21/17 complaining of SOB, thought to be due to COPD, urinary retention and hematuria. Patient has a history of bladder cancer, and was scheduled to begin radiation therapy on 06/23/17.  Assessment/Plan:  1. Acute on chronic hypoxemic respiratory failure due to COPD exacerbation. - continue steroids, oxygen as needed. -Still with significant wheezing, will not initiate steroid titration.  2. 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  3. Acute renal failure secondary to urinary retention - resolved after placement of Foley catheter.   4. Hyperlipidemia - continue Zocor  5. UTI -Was started on Bactrim as an outpatient, culture with insignificant growth, will discontinue antibiotics.   Code Status: FULL Family Communication: No family present at bedside  Disposition Plan:  -Discharge once oxygen levels return to baseline. - Follow up with pulmonologist.    Consultants:  None  Procedures:  None  Antibiotics: Anti-infectives (From admission, onward)   Start     Dose/Rate Route Frequency Ordered Stop   06/22/17 0000  sulfamethoxazole-trimethoprim (BACTRIM DS,SEPTRA DS) 800-160 MG per tablet 1 tablet  Status:  Discontinued    Comments:  7 day course starting on 06/19/2017     1 tablet Oral 2 times daily 06/21/17 2349 06/23/17 1105         HPI/Subjective: Still feels a little short of breath.  Objective: Vitals:   06/23/17 0616 06/23/17 0740  BP: (!) 120/58   Pulse: 89   Resp: (!)  21   Temp: 98.4 F (36.9 C)   SpO2: 94% 95%    Intake/Output Summary (Last 24 hours) at 06/23/2017 1419 Last data filed at 06/23/2017 0921 Gross per 24 hour  Intake 684 ml  Output 1775 ml  Net -1091 ml   Filed Weights   06/21/17 1611  Weight: 82.6 kg (182 lb)    Exam:   General:  Alert and oriented x 3.   Cardiovascular: RRR, S1 and S2. No murmurs, rubs or gallops  Respiratory: Pursed lip breathing. Wheezing bilaterally.   Abdomen: Soft. Non tender, non distended. Positive BS x 4 quadrants. No hepatosplenomegaly.  Musculoskeletal: Full ROM. Pulses full throughout.  Data Reviewed: Basic Metabolic Panel: Recent Labs  Lab 06/21/17 1657 06/22/17 0610  NA 133* 137  K 4.4 5.2*  CL 94* 99*  CO2 28 28  GLUCOSE 155* 177*  BUN 22* 21*  CREATININE 1.41* 1.02  CALCIUM 9.6 9.6   Liver Function Tests: Recent Labs  Lab 06/21/17 1657 06/22/17 0610  AST 29 19  ALT 21 14*  ALKPHOS 79 66  BILITOT 0.5 0.6  PROT 8.0 7.0  ALBUMIN 3.6 3.1*   No results for input(s): LIPASE, AMYLASE in the last 168 hours. No results for input(s): AMMONIA in the last 168 hours. CBC: Recent Labs  Lab 06/21/17 1657 06/22/17 0610  WBC 18.6* 11.3*  NEUTROABS 16.0*  --   HGB 8.2* 7.6*  HCT 28.0* 25.7*  MCV 83.3 82.9  PLT 524* 473*   Cardiac Enzymes: Recent Labs  Lab 06/21/17 1657  TROPONINI <0.03   BNP (last 3 results)  Recent Labs    06/21/17 1657  BNP 49.0    ProBNP (last 3 results) No results for input(s): PROBNP in the last 8760 hours.  CBG: No results for input(s): GLUCAP in the last 168 hours.  Recent Results (from the past 240 hour(s))  Culture, Urine     Status: Abnormal   Collection Time: 06/21/17  4:29 PM  Result Value Ref Range Status   Specimen Description   Final    URINE, CLEAN CATCH Performed at [facility], [address], [place], [place] [address]    Special Requests   Final    NONE Performed at [facility] [address] [place], [place]  [address]    Culture (A)  Final    <10,000 COLONIES/mL INSIGNIFICANT GROWTH Performed at [facility] [address] [place], [place] [address]    Report Status 06/23/2017 FINAL  Final  MRSA PCR Screening     Status: None   Collection Time: 06/22/17 12:39 PM  Result Value Ref Range Status   MRSA by PCR NEGATIVE NEGATIVE Final    Comment:        The GeneXpert MRSA Assay (FDA approved for NASAL specimens only), is one component of a comprehensive MRSA colonization surveillance program. It is not intended to diagnose MRSA infection nor to guide or monitor treatment for MRSA infections. Performed at [facility], [address], [place], [place] [address]      Studies: US Renal  Result Date: 06/22/2017 CLINICAL DATA:  Acute kidney injury. History of bladder tumor resection in 2013. EXAM: RENAL / URINARY TRACT ULTRASOUND COMPLETE COMPARISON:  CT abdomen dated 04/01/2017. FINDINGS: Right Kidney: Length: 10.8 cm. Echogenicity within normal limits. Benign cyst again noted within the midpole region, measuring 1.5 cm. No suspicious mass or hydronephrosis visualized. Left Kidney: Length: 10.6 cm. Echogenicity within normal limits. Small benign cyst again noted within the lower pole, measuring 1 cm. No suspicious mass or hydronephrosis visualized. Bladder: Bladder decompressed by Foley catheter. IMPRESSION: 1. No acute or significant findings. Both kidneys demonstrate normal cortical thickness and echogenicity. Small bilateral renal cysts, as also described on CT of 04/01/2017. 2. Bladder decompressed by Foley catheter. Electronically Signed   By: Franki Cabot M.D.   On: 06/22/2017 12:14   Dg Chest Port 1 View  Result Date: 06/21/2017 CLINICAL DATA:  SOB, pt on c-pap mask EXAM: PORTABLE CHEST 1 VIEW COMPARISON:  04/23/2017 FINDINGS: Cardiac silhouette is normal in size. No mediastinal or hilar masses. There are prominent bronchovascular markings which are similar to prior exams. There is a  possible component of interstitial edema. No evidence of pneumonia. No pleural effusion or pneumothorax. Skeletal structures are grossly intact. IMPRESSION: 1. Prominent bronchovascular markings similar to prior exams, likely chronic. A component of interstitial edema should be considered given the reported shortness of breath. No evidence of pneumonia. Electronically Signed   By: Lajean Manes M.D.   On: 06/21/2017 16:48    Scheduled Meds: . DULoxetine  60 mg Oral Daily  . ipratropium-albuterol  3 mL Nebulization Q6H  . mouth rinse  15 mL Mouth Rinse BID  . methylPREDNISolone (SOLU-MEDROL) injection  60 mg Intravenous Q6H  . pantoprazole  40 mg Oral Daily  . simvastatin  40 mg Oral QHS   Continuous Infusions: . sodium chloride 10 mL/hr at 06/22/17 0040    Active Problems:   Essential hypertension   Bladder tumor   Tobacco abuse   Hyperlipidemia   COPD exacerbation (HCC)   Respiratory  failure ([place])    Time spent: 25 minutes    [facility] Pager [phone] If 7PM-7AM, please contact night-coverage at [email or web address], password [facility] 06/23/2017, 2:19 PM  LOS: 2 days

## 2017-06-24 LAB — BASIC METABOLIC PANEL
Anion gap: 12 (ref 5–15)
BUN: 26 mg/dL — AB (ref 6–20)
CHLORIDE: 94 mmol/L — AB (ref 101–111)
CO2: 29 mmol/L (ref 22–32)
CREATININE: 0.88 mg/dL (ref 0.61–1.24)
Calcium: 9.5 mg/dL (ref 8.9–10.3)
GFR calc Af Amer: >60 mL/min (ref >60)
GFR calc non Af Amer: >60 mL/min (ref >60)
GLUCOSE: 196 mg/dL — AB (ref 65–99)
Potassium: 4.6 mmol/L (ref 3.5–5.1)
SODIUM: 135 mmol/L (ref 135–145)

## 2017-06-24 LAB — CBC
HCT: 27.3 % — ABNORMAL LOW (ref 39.0–52.0)
Hemoglobin: 7.8 g/dL — ABNORMAL LOW (ref 13.0–17.0)
MCH: 24 pg — ABNORMAL LOW (ref 26.0–34.0)
MCHC: 28.6 g/dL — AB (ref 30.0–36.0)
MCV: 84 fL (ref 78.0–100.0)
PLATELETS: 501 10*3/uL — AB (ref 150–400)
RBC: 3.25 MIL/uL — ABNORMAL LOW (ref 4.22–5.81)
RDW: 17.7 % — AB (ref 11.5–15.5)
WBC: 15.6 10*3/uL — AB (ref 4.0–10.5)

## 2017-06-24 MED ORDER — SALINE SPRAY 0.65 % NA SOLN
1.0000 | NASAL | Status: DC | PRN
  Administered 2017-06-24: 1 via NASAL
  Filled 2017-06-24: qty 44

## 2017-06-24 MED ORDER — GUAIFENESIN ER 600 MG PO TB12
1200.0000 mg | ORAL_TABLET | Freq: Two times a day (BID) | ORAL | Status: DC
  Administered 2017-06-24 – 2017-06-30 (×12): 1200 mg via ORAL
  Filled 2017-06-24 (×12): qty 2

## 2017-06-24 MED ORDER — BENZONATATE 100 MG PO CAPS
100.0000 mg | ORAL_CAPSULE | Freq: Three times a day (TID) | ORAL | Status: DC | PRN
  Administered 2017-06-24 – 2017-06-25 (×3): 100 mg via ORAL
  Filled 2017-06-24 (×3): qty 1

## 2017-06-24 MED ORDER — METHYLPREDNISOLONE SODIUM SUCC 125 MG IJ SOLR
80.0000 mg | Freq: Four times a day (QID) | INTRAMUSCULAR | Status: DC
  Administered 2017-06-24 – 2017-06-28 (×16): 80 mg via INTRAVENOUS
  Filled 2017-06-24 (×16): qty 2

## 2017-06-24 NOTE — Progress Notes (Signed)
PROGRESS NOTE    [name] [name]  ZOX:[id number] DOB: 1940-12-25 DOA: 06/21/2017 PCP: [name], MD     Brief Narrative:  77 year old man admitted from home on 3/9 due to shortness of breath.  Has a history of COPD.  As of 3/12 continues to have significant wheezing and increased oxygen requirements.   Assessment & Plan:   Active Problems:   Essential hypertension   Bladder tumor   Tobacco abuse   Hyperlipidemia   COPD exacerbation (HCC)   Respiratory failure (HCC)   Acute on chronic hypoxemic respiratory failure -Due to COPD with acute exacerbation. -Influenza PCR has resulted negative. -Chest x-ray without evidence for pneumonia. -Still with significant expiratory wheezes on lung auscultation.  Is now requiring 7 L of oxygen when he typically uses 4. -I will increase Solu-Medrol dose from 60-80 mg IV every 6 hours.  Continue nebs.Marland Kitchen  ??UTI -Bactrim has been discontinued, culture negative.  Urinary retention, hematuria in patient with bladder cancer -Foley catheter has been placed. -Will need outpatient follow-up with urology for voiding trial.  His urologist is at [facility]. - he was scheduled to start radiation to his bladder on 3/11, this has been delayed due to his current hospitalization.  Acute renal failure -Creatinine on admission was 1.4, has normalized to 0.88, suspect mainly postobstructive due to urinary retention.   DVT prophylaxis: SCDs Code Status: Full code Family Communication: Patient only Disposition Plan: Home once medically stable anticipate 48-72 hours  Consultants:   None  Procedures:   None  Antimicrobials:  Anti-infectives (From admission, onward)   Start     Dose/Rate Route Frequency Ordered Stop   06/22/17 0000  sulfamethoxazole-trimethoprim (BACTRIM DS,SEPTRA DS) 800-160 MG per tablet 1 tablet  Status:  Discontinued    Comments:  7 day course starting on 06/19/2017     1 tablet Oral 2 times daily 06/21/17 2349 06/23/17 1105       Subjective: Concerned about his hematuria, states he has not noticed any significant improvement in his shortness of breath  Objective: Vitals:   06/24/17 0500 06/24/17 0803 06/24/17 1347 06/24/17 1353  BP: 120/62  (!) 165/79   Pulse: 90  (!) 113   Resp: 18  (!) 22   Temp: 98.2 F (36.8 C)  98 F (36.7 C)   TempSrc: Oral  Oral   SpO2: 96% 97% 96% (!) 89%  Weight:      Height:        Intake/Output Summary (Last 24 hours) at 06/24/2017 1505 Last data filed at 06/24/2017 1443 Gross per 24 hour  Intake 1180 ml  Output 1900 ml  Net -720 ml   Filed Weights   06/21/17 1611  Weight: 82.6 kg (182 lb)    Examination:  General exam: Alert, awake, oriented x 3 Respiratory system: Diffuse expiratory wheezing Cardiovascular system:RRR. No murmurs, rubs, gallops. Gastrointestinal system: Abdomen is nondistended, soft and nontender. No organomegaly or masses felt. Normal bowel sounds heard. Central nervous system: Alert and oriented. No focal neurological deficits. Extremities: No C/C/E, +pedal pulses Skin: No rashes, lesions or ulcers Psychiatry: Judgement and insight appear normal. Mood & affect appropriate.     Data Reviewed: I have personally reviewed following labs and imaging studies  CBC: Recent Labs  Lab 06/21/17 1657 06/22/17 0610 06/24/17 0554  WBC 18.6* 11.3* 15.6*  NEUTROABS 16.0*  --   --   HGB 8.2* 7.6* 7.8*  HCT 28.0* 25.7* 27.3*  MCV 83.3 82.9 84.0  PLT 524* 473* 501*  Basic Metabolic Panel: Recent Labs  Lab 06/21/17 1657 06/22/17 0610 06/24/17 0554  NA 133* 137 135  K 4.4 5.2* 4.6  CL 94* 99* 94*  CO2 28 28 29   GLUCOSE 155* 177* 196*  BUN 22* 21* 26*  CREATININE 1.41* 1.02 0.88  CALCIUM 9.6 9.6 9.5   GFR: Estimated Creatinine Clearance: 72.5 mL/min (by C-G formula based on SCr of 0.88 mg/dL). Liver Function Tests: Recent Labs  Lab 06/21/17 1657 06/22/17 0610  AST 29 19  ALT 21 14*  ALKPHOS 79 66  BILITOT 0.5 0.6  PROT 8.0 7.0   ALBUMIN 3.6 3.1*   No results for input(s): LIPASE, AMYLASE in the last 168 hours. No results for input(s): AMMONIA in the last 168 hours. Coagulation Profile: No results for input(s): INR, PROTIME in the last 168 hours. Cardiac Enzymes: Recent Labs  Lab 06/21/17 1657  TROPONINI <0.03   BNP (last 3 results) No results for input(s): PROBNP in the last 8760 hours. HbA1C: No results for input(s): HGBA1C in the last 72 hours. CBG: No results for input(s): GLUCAP in the last 168 hours. Lipid Profile: No results for input(s): CHOL, HDL, LDLCALC, TRIG, CHOLHDL, LDLDIRECT in the last 72 hours. Thyroid Function Tests: No results for input(s): TSH, T4TOTAL, FREET4, T3FREE, THYROIDAB in the last 72 hours. Anemia Panel: No results for input(s): VITAMINB12, FOLATE, FERRITIN, TIBC, IRON, RETICCTPCT in the last 72 hours. Urine analysis:    Component Value Date/Time   COLORURINE RED (A) 06/21/2017 1629   APPEARANCEUR CLOUDY (A) 06/21/2017 1629   APPEARANCEUR Turbid (A) 01/25/2016 1407   LABSPEC >1.030 (H) 06/21/2017 1629   PHURINE 6.5 06/21/2017 1629   GLUCOSEU NEGATIVE 06/21/2017 1629   HGBUR LARGE (A) 06/21/2017 1629   BILIRUBINUR NEGATIVE 06/21/2017 1629   BILIRUBINUR Negative 01/25/2016 1407   KETONESUR NEGATIVE 06/21/2017 1629   PROTEINUR >300 (A) 06/21/2017 1629   UROBILINOGEN 0.2 05/04/2014 0956   NITRITE NEGATIVE 06/21/2017 1629   LEUKOCYTESUR TRACE (A) 06/21/2017 1629   LEUKOCYTESUR 3+ (A) 01/25/2016 1407   Sepsis Labs: @LABRCNTIP (procalcitonin:4,lacticidven:4)  ) Recent Results (from the past 240 hour(s))  Culture, Urine     Status: Abnormal   Collection Time: 06/21/17  4:29 PM  Result Value Ref Range Status   Specimen Description   Final    URINE, CLEAN CATCH Performed at [facility], [address] [place], [place] [address]    Special Requests   Final    NONE Performed at [facility], [address], [place], [place] [address]    Culture (A)  Final     <10,000 COLONIES/mL INSIGNIFICANT GROWTH Performed at [facility], [place] [address] [place], [place] [address]    Report Status 06/23/2017 FINAL  Final  MRSA PCR Screening     Status: None   Collection Time: 06/22/17 12:39 PM  Result Value Ref Range Status   MRSA by PCR NEGATIVE NEGATIVE Final    Comment:        The GeneXpert MRSA Assay (FDA approved for NASAL specimens only), is one component of a comprehensive MRSA colonization surveillance program. It is not intended to diagnose MRSA infection nor to guide or monitor treatment for MRSA infections. Performed at [facility], [address] [place], [place] [address]          Radiology Studies: No results found.      Scheduled Meds: . DULoxetine  60 mg Oral Daily  . guaiFENesin  1,200 mg Oral BID  . ipratropium-albuterol  3  mL Nebulization Q6H  . mouth rinse  15 mL Mouth Rinse BID  . methylPREDNISolone (SOLU-MEDROL) injection  60 mg Intravenous Q6H  . nicotine  21 mg Transdermal Daily  . pantoprazole  40 mg Oral Daily  . simvastatin  40 mg Oral QHS   Continuous Infusions: . sodium chloride 10 mL/hr at 06/22/17 0040     LOS: 3 days    Time spent: 25 minutes. Greater than 50% of this time was spent in direct contact with the patient coordinating care.     Lelon Frohlich, MD [facility] Pager [phone]  If 7PM-7AM, please contact night-coverage [email or web address] Password [facility] 06/24/2017, 3:05 PM

## 2017-06-25 DIAGNOSIS — D494 Neoplasm of unspecified behavior of bladder: Secondary | ICD-10-CM

## 2017-06-25 DIAGNOSIS — J9621 Acute and chronic respiratory failure with hypoxia: Secondary | ICD-10-CM

## 2017-06-25 DIAGNOSIS — C689 Malignant neoplasm of urinary organ, unspecified: Secondary | ICD-10-CM

## 2017-06-25 DIAGNOSIS — J441 Chronic obstructive pulmonary disease with (acute) exacerbation: Secondary | ICD-10-CM

## 2017-06-25 DIAGNOSIS — I10 Essential (primary) hypertension: Secondary | ICD-10-CM

## 2017-06-25 DIAGNOSIS — N179 Acute kidney failure, unspecified: Secondary | ICD-10-CM

## 2017-06-25 MED ORDER — ARFORMOTEROL TARTRATE 15 MCG/2ML IN NEBU
15.0000 ug | INHALATION_SOLUTION | Freq: Two times a day (BID) | RESPIRATORY_TRACT | Status: DC
  Administered 2017-06-25 – 2017-06-30 (×10): 15 ug via RESPIRATORY_TRACT
  Filled 2017-06-25 (×10): qty 2

## 2017-06-25 MED ORDER — BUDESONIDE 0.5 MG/2ML IN SUSP
0.5000 mg | Freq: Two times a day (BID) | RESPIRATORY_TRACT | Status: DC
  Administered 2017-06-25 – 2017-06-30 (×10): 0.5 mg via RESPIRATORY_TRACT
  Filled 2017-06-25 (×10): qty 2

## 2017-06-25 NOTE — Progress Notes (Signed)
PROGRESS NOTE  [name] [name] DPO:[id number] DOB: [birth year] DOA: 06/21/2017 PCP: [name], MD  Brief History:  77 year old male with a history of bladder cancer, chronic respiratory failure on 4 L oxygen, hyperlipidemia, depression, hyperlipidemia presenting with shortness of breath.  The patient was noted to be in respiratory distress upon arrival.  He was placed on BiPAP.  He was admitted to the stepdown unit.  The patient was started on bronchodilators and intravenous Solu-Medrol.  He was also noted to have urinary retention.  A Foley catheter was placed in the emergency department on 06/21/2017.  Assessment/Plan: Acute on chronic respiratory failure with hypoxia -Secondary to COPD exacerbation -Normally on 4 L nasal cannula at home -Presented with respiratory distress requiring BiPAP -Presently stable on 5 L  -Wean oxygen back to baseline level  COPD exacerbation -Very slow improvement -Remains short of breath just with speaking -Very low functional reserve -Continue duo nebs -Add Pulmicort -Add Brovana -Continue IV Solu-Medrol  Pyuria -Secondary to bladder cancer -urine cultures here and at [facility] unrevealing  Urinary retention/hematuria/high grade urothelial carcinoma -Foley catheter has been placed. -Will need outpatient follow-up with urology for voiding trial.  His urologist is at [facility]. - he was scheduled to start radiation to his bladder on 3/11, this has been delayed due to his current hospitalization. -04/26/17--CYSTOSCOPY W/ FULGURATION / EVACUATION OF CLOTS, TURBT  AKI -likely volume depletion with likely degree of obstructive uropathy -resolved with foley  GOC -discussed advance planning with sister at bedside--understands and agrees with DNR    Disposition Plan:   Home in 2-3 days when respiratory status improves Family Communication:   Sister updated at bedside 06/25/17--Total time spent 35 minutes.  Greater than 50% spent face to face  counseling and coordinating care.    Consultants:  none Code Status:  DNR  DVT Prophylaxis:  SCDs   Procedures: As Listed in Progress Note Above  Antibiotics: None    Subjective: Patient feels that his breathing is 25% better.  He still has shortness of breath with speaking long periods.  He denies any nausea, vomiting, diarrhea, abdominal pain.  There is no headache or neck pain.  He denies any fevers or chills.  Objective: Vitals:   06/25/17 0103 06/25/17 0600 06/25/17 0803 06/25/17 1420  BP:  (!) 143/69    Pulse:  78    Resp:  20    Temp:  98 F (36.7 C)    TempSrc:  Oral    SpO2: 94% 95% (!) 87% 95%  Weight:      Height:        Intake/Output Summary (Last 24 hours) at 06/25/2017 1744 Last data filed at 06/24/2017 2300 Gross per 24 hour  Intake 240 ml  Output 900 ml  Net -660 ml   Weight change:  Exam:   General:  Pt is alert, follows commands appropriately, not in acute distress  HEENT: No icterus, No thrush, No neck mass, [place]/AT  Cardiovascular: RRR, S1/S2, no rubs, no gallops  Respiratory: Bibasilar rales.  Bilateral expiratory wheeze.  Abdomen: Soft/+BS, non tender, non distended, no guarding  Extremities: No edema, No lymphangitis, No petechiae, No rashes, no synovitis   Data Reviewed: I have personally reviewed following labs and imaging studies Basic Metabolic Panel: Recent Labs  Lab 06/21/17 1657 06/22/17 0610 06/24/17 0554  NA 133* 137 135  K 4.4 5.2* 4.6  CL 94* 99* 94*  CO2 28 28 29   GLUCOSE 155* 177*  196*  BUN 22* 21* 26*  CREATININE 1.41* 1.02 0.88  CALCIUM 9.6 9.6 9.5   Liver Function Tests: Recent Labs  Lab 06/21/17 1657 06/22/17 0610  AST 29 19  ALT 21 14*  ALKPHOS 79 66  BILITOT 0.5 0.6  PROT 8.0 7.0  ALBUMIN 3.6 3.1*   No results for input(s): LIPASE, AMYLASE in the last 168 hours. No results for input(s): AMMONIA in the last 168 hours. Coagulation Profile: No results for input(s): INR, PROTIME in the last  168 hours. CBC: Recent Labs  Lab 06/21/17 1657 06/22/17 0610 06/24/17 0554  WBC 18.6* 11.3* 15.6*  NEUTROABS 16.0*  --   --   HGB 8.2* 7.6* 7.8*  HCT 28.0* 25.7* 27.3*  MCV 83.3 82.9 84.0  PLT 524* 473* 501*   Cardiac Enzymes: Recent Labs  Lab 06/21/17 1657  TROPONINI <0.03   BNP: Invalid input(s): POCBNP CBG: No results for input(s): GLUCAP in the last 168 hours. HbA1C: No results for input(s): HGBA1C in the last 72 hours. Urine analysis:    Component Value Date/Time   COLORURINE RED (A) 06/21/2017 1629   APPEARANCEUR CLOUDY (A) 06/21/2017 1629   APPEARANCEUR Turbid (A) 01/25/2016 1407   LABSPEC >1.030 (H) 06/21/2017 1629   PHURINE 6.5 06/21/2017 1629   GLUCOSEU NEGATIVE 06/21/2017 1629   HGBUR LARGE (A) 06/21/2017 1629   BILIRUBINUR NEGATIVE 06/21/2017 1629   BILIRUBINUR Negative 01/25/2016 1407   KETONESUR NEGATIVE 06/21/2017 1629   PROTEINUR >300 (A) 06/21/2017 1629   UROBILINOGEN 0.2 05/04/2014 0956   NITRITE NEGATIVE 06/21/2017 1629   LEUKOCYTESUR TRACE (A) 06/21/2017 1629   LEUKOCYTESUR 3+ (A) 01/25/2016 1407   Sepsis Labs: @LABRCNTIP (procalcitonin:4,lacticidven:4) ) Recent Results (from the past 240 hour(s))  Culture, Urine     Status: Abnormal   Collection Time: 06/21/17  4:29 PM  Result Value Ref Range Status   Specimen Description   Final    URINE, CLEAN CATCH Performed at [facility], [address], [place], [place] [address]    Special Requests   Final    NONE Performed at [facility], [address], [place], [place] [address]    Culture (A)  Final    <10,000 COLONIES/mL INSIGNIFICANT GROWTH Performed at [facility], [place] [address], [place], [place] [address]    Report Status 06/23/2017 FINAL  Final  MRSA PCR Screening     Status: None   Collection Time: 06/22/17 12:39 PM  Result Value Ref Range Status   MRSA by PCR NEGATIVE NEGATIVE Final    Comment:        The GeneXpert MRSA Assay (FDA approved for NASAL specimens only),  is one component of a comprehensive MRSA colonization surveillance program. It is not intended to diagnose MRSA infection nor to guide or monitor treatment for MRSA infections. Performed at [facility], [address], [place], [place] [address]      Scheduled Meds: . DULoxetine  60 mg Oral Daily  . guaiFENesin  1,200 mg Oral BID  . ipratropium-albuterol  3 mL Nebulization Q6H  . mouth rinse  15 mL Mouth Rinse BID  . methylPREDNISolone (SOLU-MEDROL) injection  80 mg Intravenous Q6H  . nicotine  21 mg Transdermal Daily  . pantoprazole  40 mg Oral Daily  . simvastatin  40 mg Oral QHS   Continuous Infusions: . sodium chloride 10 mL/hr at 06/22/17 0040    Procedures/Studies: US Renal  Result Date: 06/22/2017 CLINICAL DATA:  Acute kidney injury. History of bladder tumor resection in  2013. EXAM: RENAL / URINARY TRACT ULTRASOUND COMPLETE COMPARISON:  CT abdomen dated 04/01/2017. FINDINGS: Right Kidney: Length: 10.8 cm. Echogenicity within normal limits. Benign cyst again noted within the midpole region, measuring 1.5 cm. No suspicious mass or hydronephrosis visualized. Left Kidney: Length: 10.6 cm. Echogenicity within normal limits. Small benign cyst again noted within the lower pole, measuring 1 cm. No suspicious mass or hydronephrosis visualized. Bladder: Bladder decompressed by Foley catheter. IMPRESSION: 1. No acute or significant findings. Both kidneys demonstrate normal cortical thickness and echogenicity. Small bilateral renal cysts, as also described on CT of 04/01/2017. 2. Bladder decompressed by Foley catheter. Electronically Signed   By: Franki Cabot M.D.   On: 06/22/2017 12:14   Dg Chest Port 1 View  Result Date: 06/21/2017 CLINICAL DATA:  SOB, pt on c-pap mask EXAM: PORTABLE CHEST 1 VIEW COMPARISON:  04/23/2017 FINDINGS: Cardiac silhouette is normal in size. No mediastinal or hilar masses. There are prominent bronchovascular markings which are similar to prior exams. There is a  possible component of interstitial edema. No evidence of pneumonia. No pleural effusion or pneumothorax. Skeletal structures are grossly intact. IMPRESSION: 1. Prominent bronchovascular markings similar to prior exams, likely chronic. A component of interstitial edema should be considered given the reported shortness of breath. No evidence of pneumonia. Electronically Signed   By: Lajean Manes M.D.   On: 06/21/2017 16:48    Orson Eva, DO  [facility] Pager [phone]  If 7PM-7AM, please contact night-coverage [email or web address] Password [email or web address] 06/25/2017, 5:44 PM   LOS: 4 days

## 2017-06-26 DIAGNOSIS — Z720 Tobacco use: Secondary | ICD-10-CM

## 2017-06-26 LAB — MAGNESIUM: Magnesium: 1.9 mg/dL (ref 1.7–2.4)

## 2017-06-26 LAB — BASIC METABOLIC PANEL
ANION GAP: 12 (ref 5–15)
BUN: 24 mg/dL — AB (ref 6–20)
CHLORIDE: 92 mmol/L — AB (ref 101–111)
CO2: 31 mmol/L (ref 22–32)
Calcium: 9.4 mg/dL (ref 8.9–10.3)
Creatinine, Ser: 0.92 mg/dL (ref 0.61–1.24)
GFR calc Af Amer: >60 mL/min (ref >60)
GFR calc non Af Amer: >60 mL/min (ref >60)
GLUCOSE: 287 mg/dL — AB (ref 65–99)
POTASSIUM: 4.3 mmol/L (ref 3.5–5.1)
SODIUM: 135 mmol/L (ref 135–145)

## 2017-06-26 LAB — CBC
HEMATOCRIT: 29.3 % — AB (ref 39.0–52.0)
HEMOGLOBIN: 8.5 g/dL — AB (ref 13.0–17.0)
MCH: 24.2 pg — ABNORMAL LOW (ref 26.0–34.0)
MCHC: 29 g/dL — ABNORMAL LOW (ref 30.0–36.0)
MCV: 83.5 fL (ref 78.0–100.0)
Platelets: 492 10*3/uL — ABNORMAL HIGH (ref 150–400)
RBC: 3.51 MIL/uL — ABNORMAL LOW (ref 4.22–5.81)
RDW: 17.3 % — AB (ref 11.5–15.5)
WBC: 11.9 10*3/uL — AB (ref 4.0–10.5)

## 2017-06-26 LAB — GLUCOSE, CAPILLARY: GLUCOSE-CAPILLARY: 416 mg/dL — AB (ref 65–99)

## 2017-06-26 MED ORDER — ALBUTEROL SULFATE (2.5 MG/3ML) 0.083% IN NEBU
2.5000 mg | INHALATION_SOLUTION | RESPIRATORY_TRACT | Status: DC | PRN
  Administered 2017-06-26: 2.5 mg via RESPIRATORY_TRACT
  Filled 2017-06-26: qty 3

## 2017-06-26 MED ORDER — INSULIN ASPART 100 UNIT/ML ~~LOC~~ SOLN
0.0000 [IU] | Freq: Three times a day (TID) | SUBCUTANEOUS | Status: DC
Start: 2017-06-27 — End: 2017-06-27
  Administered 2017-06-27: 2 [IU] via SUBCUTANEOUS
  Administered 2017-06-27: 5 [IU] via SUBCUTANEOUS

## 2017-06-26 MED ORDER — INSULIN ASPART 100 UNIT/ML ~~LOC~~ SOLN
0.0000 [IU] | Freq: Every day | SUBCUTANEOUS | Status: DC
  Administered 2017-06-26: 5 [IU] via SUBCUTANEOUS

## 2017-06-26 NOTE — Progress Notes (Signed)
PROGRESS NOTE  [name] [name]:[id number] DOB: 04-Nov-1940 DOA: 06/21/2017 PCP: [name], MD  Brief History:  77 year old male with a history of bladder cancer, chronic respiratory failure on 4 L oxygen, hyperlipidemia, depression, hyperlipidemia presenting with shortness of breath.  The patient was noted to be in respiratory distress upon arrival.  He was placed on BiPAP.  He was admitted to the stepdown unit.  The patient was started on bronchodilators and intravenous Solu-Medrol.  He was also noted to have urinary retention.  A Foley catheter was placed in the emergency department on 06/21/2017.  Patient had very slow improvement.  The patient's Solu-Medrol was increased; Pulmicort and Brovana were added.  Assessment/Plan: Acute on chronic respiratory failure with hypoxia -Secondary to COPD exacerbation -Normally on 4 L nasal cannula at home -Presented with respiratory distress requiring BiPAP -Presently stable on 5 L  -Wean oxygen back to baseline level -check Mag/phos  COPD exacerbation -Very slow improvement -Remains short of breath just with speaking -Very low functional reserve -Continue duo nebs -Continue Pulmicort -Continue Brovana -Continue IV Solu-Medrol  Pyuria -Secondary to bladder cancer -urine cultures here and at [facility] unrevealing  Urinary retention/hematuria/high grade urothelial carcinoma -Foley catheter has been placed. -Will need outpatient follow-up with urology for voiding trial.His urologist is at [facility]. -he was scheduled to start radiation to his bladder on 3/11, this has been delayed due to his current hospitalization. -04/26/17--CYSTOSCOPY W/ FULGURATION / EVACUATION OF CLOTS, TURBT -case discussed with pt's rad onc at [facility] (Dr. Quitman Livings)  Drug induced hyperglycemia -check A1C -novolog ISS -due to steroids  AKI -likely volume depletion with likely degree of obstructive uropathy -resolved with  foley  GOC -discussed advance planning with sister at bedside--understands and agrees with DNR    Disposition Plan:   Home 3/16 or 3/16 if respiratory status improves Family Communication:   Sister updated at bedside 06/26/17   Consultants:  none Code Status:  DNR  DVT Prophylaxis:  SCDs   Procedures: As Listed in Progress Note Above  Antibiotics: None      Subjective: Patient states that he is breathing slightly better today.  He still has dyspnea with minimal exertion.  He denies any fevers, chills, chest pain, nausea, vomiting, diarrhea, abdominal pain.  There is no dysuria or hematuria.  There is no rashes.  Objective: Vitals:   06/26/17 0732 06/26/17 0744 06/26/17 1147 06/26/17 1350  BP:      Pulse:      Resp:      Temp:      TempSrc:      SpO2: 99% 98% 91% 92%  Weight:      Height:        Intake/Output Summary (Last 24 hours) at 06/26/2017 1757 Last data filed at 06/26/2017 0700 Gross per 24 hour  Intake 420 ml  Output 1850 ml  Net -1430 ml   Weight change:  Exam:   General:  Pt is alert, follows commands appropriately, not in acute distress  HEENT: No icterus, No thrush, No neck mass, [place]/AT  Cardiovascular: RRR, S1/S2, no rubs, no gallops  Respiratory: Bilateral rales.  Bibasilar expiratory wheeze.  Good air movement.  Abdomen: Soft/+BS, non tender, non distended, no guarding  Extremities: No edema, No lymphangitis, No petechiae, No rashes, no synovitis   Data Reviewed: I have personally reviewed following labs and imaging studies Basic Metabolic Panel: Recent Labs  Lab 06/21/17 1657 06/22/17 0610 06/24/17 0554 06/26/17 1011  NA  133* 137 135 135  K 4.4 5.2* 4.6 4.3  CL 94* 99* 94* 92*  CO2 28 28 29 31   GLUCOSE 155* 177* 196* 287*  BUN 22* 21* 26* 24*  CREATININE 1.41* 1.02 0.88 0.92  CALCIUM 9.6 9.6 9.5 9.4  MG  --   --   --  1.9   Liver Function Tests: Recent Labs  Lab 06/21/17 1657 06/22/17 0610  AST 29 19   ALT 21 14*  ALKPHOS 79 66  BILITOT 0.5 0.6  PROT 8.0 7.0  ALBUMIN 3.6 3.1*   No results for input(s): LIPASE, AMYLASE in the last 168 hours. No results for input(s): AMMONIA in the last 168 hours. Coagulation Profile: No results for input(s): INR, PROTIME in the last 168 hours. CBC: Recent Labs  Lab 06/21/17 1657 06/22/17 0610 06/24/17 0554 06/26/17 1011  WBC 18.6* 11.3* 15.6* 11.9*  NEUTROABS 16.0*  --   --   --   HGB 8.2* 7.6* 7.8* 8.5*  HCT 28.0* 25.7* 27.3* 29.3*  MCV 83.3 82.9 84.0 83.5  PLT 524* 473* 501* 492*   Cardiac Enzymes: Recent Labs  Lab 06/21/17 1657  TROPONINI <0.03   BNP: Invalid input(s): POCBNP CBG: No results for input(s): GLUCAP in the last 168 hours. HbA1C: No results for input(s): HGBA1C in the last 72 hours. Urine analysis:    Component Value Date/Time   COLORURINE RED (A) 06/21/2017 1629   APPEARANCEUR CLOUDY (A) 06/21/2017 1629   APPEARANCEUR Turbid (A) 01/25/2016 1407   LABSPEC >1.030 (H) 06/21/2017 1629   PHURINE 6.5 06/21/2017 1629   GLUCOSEU NEGATIVE 06/21/2017 1629   HGBUR LARGE (A) 06/21/2017 1629   BILIRUBINUR NEGATIVE 06/21/2017 1629   BILIRUBINUR Negative 01/25/2016 1407   KETONESUR NEGATIVE 06/21/2017 1629   PROTEINUR >300 (A) 06/21/2017 1629   UROBILINOGEN 0.2 05/04/2014 0956   NITRITE NEGATIVE 06/21/2017 1629   LEUKOCYTESUR TRACE (A) 06/21/2017 1629   LEUKOCYTESUR 3+ (A) 01/25/2016 1407   Sepsis Labs: @LABRCNTIP (procalcitonin:4,lacticidven:4) ) Recent Results (from the past 240 hour(s))  Culture, Urine     Status: Abnormal   Collection Time: 06/21/17  4:29 PM  Result Value Ref Range Status   Specimen Description   Final    URINE, CLEAN CATCH Performed at [facility], [address], [place], [place] [address]    Special Requests   Final    NONE Performed at [facility], [address] [place], [place] [address]    Culture (A)  Final    <10,000 COLONIES/mL INSIGNIFICANT GROWTH Performed at [facility], [place] [address] [place], [place] [address]    Report Status 06/23/2017 FINAL  Final  MRSA PCR Screening     Status: None   Collection Time: 06/22/17 12:39 PM  Result Value Ref Range Status   MRSA by PCR NEGATIVE NEGATIVE Final    Comment:        The GeneXpert MRSA Assay (FDA approved for NASAL specimens only), is one component of a comprehensive MRSA colonization surveillance program. It is not intended to diagnose MRSA infection nor to guide or monitor treatment for MRSA infections. Performed at [facility], [address], [place], [place] [address]      Scheduled Meds: . arformoterol  15 mcg Nebulization BID  . budesonide (PULMICORT) nebulizer solution  0.5 mg Nebulization BID  . DULoxetine  60 mg Oral Daily  . guaiFENesin  1,200 mg Oral BID  . ipratropium-albuterol  3 mL Nebulization Q6H  . mouth rinse  15 mL  Mouth Rinse BID  . methylPREDNISolone (SOLU-MEDROL) injection  80 mg Intravenous Q6H  . nicotine  21 mg Transdermal Daily  . pantoprazole  40 mg Oral Daily  . simvastatin  40 mg Oral QHS   Continuous Infusions: . sodium chloride 10 mL/hr at 06/25/17 2316    Procedures/Studies: US Renal  Result Date: 06/22/2017 CLINICAL DATA:  Acute kidney injury. History of bladder tumor resection in 2013. EXAM: RENAL / URINARY TRACT ULTRASOUND COMPLETE COMPARISON:  CT abdomen dated 04/01/2017. FINDINGS: Right Kidney: Length: 10.8 cm. Echogenicity within normal limits. Benign cyst again noted within the midpole region, measuring 1.5 cm. No suspicious mass or hydronephrosis visualized. Left Kidney: Length: 10.6 cm. Echogenicity within normal limits. Small benign cyst again noted within the lower pole, measuring 1 cm. No suspicious mass or hydronephrosis visualized. Bladder: Bladder decompressed by Foley catheter. IMPRESSION: 1. No acute or significant findings. Both kidneys demonstrate normal cortical thickness and echogenicity. Small bilateral renal cysts, as also described  on CT of 04/01/2017. 2. Bladder decompressed by Foley catheter. Electronically Signed   By: Franki Cabot M.D.   On: 06/22/2017 12:14   Dg Chest Port 1 View  Result Date: 06/21/2017 CLINICAL DATA:  SOB, pt on c-pap mask EXAM: PORTABLE CHEST 1 VIEW COMPARISON:  04/23/2017 FINDINGS: Cardiac silhouette is normal in size. No mediastinal or hilar masses. There are prominent bronchovascular markings which are similar to prior exams. There is a possible component of interstitial edema. No evidence of pneumonia. No pleural effusion or pneumothorax. Skeletal structures are grossly intact. IMPRESSION: 1. Prominent bronchovascular markings similar to prior exams, likely chronic. A component of interstitial edema should be considered given the reported shortness of breath. No evidence of pneumonia. Electronically Signed   By: Lajean Manes M.D.   On: 06/21/2017 16:48    Orson Eva, DO  [facility] Pager [phone]  If 7PM-7AM, please contact night-coverage [email or web address] Password [email or web address] 06/26/2017, 5:57 PM   LOS: 5 days

## 2017-06-26 NOTE — Progress Notes (Signed)
Inpatient Diabetes Program Recommendations  AACE/ADA: New Consensus Statement on Inpatient Glycemic Control (2015)  Target Ranges:  Prepandial:   less than 140 mg/dL      Peak postprandial:   less than 180 mg/dL (1-2 hours)      Critically ill patients:  140 - 180 mg/dL   Lab Results  Component Value Date   GLUCAP 217 (H) 09/28/2015   HGBA1C 5.7 (H) 05/05/2014    Review of Glycemic Control Results for ALLIN, FRIX (MRN [id number]) as of 06/26/2017 12:08  Ref. Range 06/24/2017 05:54 06/26/2017 10:11  Glucose Latest Ref Range: 65 - 99 mg/dL 196 (H) 287 (H)   Diabetes history: No DM hx noted  Inpatient Diabetes Program Recommendations:   While in the hospital and on steroids: -Glycemic control order set with Novolog sensitive correction tid  Thank you, Nani Gasser. Geroldine Esquivias, RN, MSN, CDE  Diabetes Coordinator Inpatient Glycemic Control Team Team Pager [phone] (8am-5pm) 06/26/2017 12:09 PM

## 2017-06-26 NOTE — Care Management Note (Addendum)
Case Management Note  Patient Details  Name: [name] [name] MRN: [id number] Date of Birth: 07-06-1940  Subjective/Objective:   Adm with COPD exacerbation. From home with "2 guys". Has cane, RW, O2, hover round, old hospital bed. Reports he has a "lady" that stays with him to help with chores and cooking, pays OOP for this. Reports he is unsure if she will continue to be there since he has been in the hospital, he has not talked to her. He would like a new hospital bed. He is interested in home health. Will order with Alvis Lemmings [facility] services).                  Action/Plan: DC home with home health.  ADDENDUM: Discussed plan with daughter via phone. She is working to get patient a care giver through [employer] using a LTC policy. CM called [facility] per daughter request to see if CM can help facilitate. Per Sherri at [employer], she is waiting for PCP to send in authorization.  Juliann Pulse of [facility] notified on need for hospital bed.  Georgina Snell of [facility] notified of potential DC over the weekend.  Expected Discharge Date:    06/29/2017              Expected Discharge Plan:     In-House Referral:     Discharge planning Services  CM Consult  Post Acute Care Choice:    Choice offered to:     DME Arranged:   Hospital Bed  DME Agency:   [facility]  HH Arranged:   RN, PT [facility] Agency:   Alvis Lemmings  Status of Service:  In process, will continue to follow  If discussed at Long Length of Stay Meetings, dates discussed:    Additional Comments: / Mariah Gerstenberger, Chauncey Reading, RN 06/26/2017, 3:43 PM

## 2017-06-26 NOTE — Care Management Note (Signed)
Case Management Note  Patient Details  Name: [name] [name] MRN: [id number] Date of Birth: Apr 07, 1941   If discussed at [place] Length of Stay Meetings, dates discussed:   06/26/2017  Additional Comments:  Kahealani Yankovich, Chauncey Reading, RN 06/26/2017, 12:26 PM

## 2017-06-27 LAB — GLUCOSE, CAPILLARY
GLUCOSE-CAPILLARY: 200 mg/dL — AB (ref 65–99)
GLUCOSE-CAPILLARY: 286 mg/dL — AB (ref 65–99)
Glucose-Capillary: 190 mg/dL — ABNORMAL HIGH (ref 65–99)
Glucose-Capillary: 298 mg/dL — ABNORMAL HIGH (ref 65–99)

## 2017-06-27 LAB — HEMOGLOBIN A1C
HEMOGLOBIN A1C: 6.2 % — AB (ref 4.8–5.6)
MEAN PLASMA GLUCOSE: 131.24 mg/dL

## 2017-06-27 LAB — CBC
HEMATOCRIT: 28.6 % — AB (ref 39.0–52.0)
HEMOGLOBIN: 8.2 g/dL — AB (ref 13.0–17.0)
MCH: 24 pg — ABNORMAL LOW (ref 26.0–34.0)
MCHC: 28.7 g/dL — AB (ref 30.0–36.0)
MCV: 83.6 fL (ref 78.0–100.0)
Platelets: 489 10*3/uL — ABNORMAL HIGH (ref 150–400)
RBC: 3.42 MIL/uL — ABNORMAL LOW (ref 4.22–5.81)
RDW: 17.4 % — ABNORMAL HIGH (ref 11.5–15.5)
WBC: 13.6 10*3/uL — ABNORMAL HIGH (ref 4.0–10.5)

## 2017-06-27 LAB — MAGNESIUM: Magnesium: 2 mg/dL (ref 1.7–2.4)

## 2017-06-27 LAB — PHOSPHORUS: PHOSPHORUS: 3 mg/dL (ref 2.5–4.6)

## 2017-06-27 MED ORDER — INSULIN ASPART 100 UNIT/ML ~~LOC~~ SOLN
0.0000 [IU] | Freq: Three times a day (TID) | SUBCUTANEOUS | Status: DC
  Administered 2017-06-27: 3 [IU] via SUBCUTANEOUS
  Administered 2017-06-28 (×2): 8 [IU] via SUBCUTANEOUS
  Administered 2017-06-28: 3 [IU] via SUBCUTANEOUS
  Administered 2017-06-29: 8 [IU] via SUBCUTANEOUS
  Administered 2017-06-29 (×2): 3 [IU] via SUBCUTANEOUS
  Administered 2017-06-30: 2 [IU] via SUBCUTANEOUS
  Administered 2017-06-30: 5 [IU] via SUBCUTANEOUS

## 2017-06-27 MED ORDER — INSULIN ASPART 100 UNIT/ML ~~LOC~~ SOLN
0.0000 [IU] | Freq: Every day | SUBCUTANEOUS | Status: DC
  Administered 2017-06-27: 3 [IU] via SUBCUTANEOUS

## 2017-06-27 MED ORDER — INSULIN GLARGINE 100 UNIT/ML ~~LOC~~ SOLN
5.0000 [IU] | Freq: Every day | SUBCUTANEOUS | Status: DC
  Administered 2017-06-27 – 2017-06-30 (×4): 5 [IU] via SUBCUTANEOUS
  Filled 2017-06-27 (×5): qty 0.05

## 2017-06-27 NOTE — Progress Notes (Signed)
PROGRESS NOTE  CEASAR DECANDIA MCN:[id number] DOB: April 25, 1940 DOA: 06/21/2017 PCP: [name], MD  Brief History: 77 year old male with a history of bladder cancer, chronic respiratory failure on 4 L oxygen, hyperlipidemia, depression, hyperlipidemia presenting with shortness of breath. The patient was noted to be in respiratory distress upon arrival. He was placed on BiPAP. He was admitted to the stepdown unit. The patient was started on bronchodilators and intravenous Solu-Medrol. He was also noted to have urinary retention. A Foley catheter was placed in the emergency department on 06/21/2017.  Patient had very slow improvement.  The patient's Solu-Medrol was increased; Pulmicort and Brovana were added.  Assessment/Plan: Acute on chronic respiratory failure with hypoxia -Secondary to COPD exacerbation -Normally on 4 L nasal cannula at home -Presented with respiratory distress requiring BiPAP -Presently stable on 5 L -Wean oxygen back to baseline level -check Mag--2.0, phos 3.0  COPD exacerbation -Very slow improvement -Remains short of breath just with speaking -Very low functional reserve -Continue duo nebs -Continue Pulmicort -Continue Brovana -Continue IV Solu-Medrol  Pyuria -Secondary to bladder cancer -urine cultures here and at [facility] unrevealing  Urinary retention/hematuria/high grade urothelial carcinoma -Foley catheter has been placed. -Will need outpatient follow-up with urology for voiding trial.His urologist is at [facility]. -he was scheduled to start radiation to his bladder on 3/11, this has been delayed due to his current hospitalization. -04/26/17--CYSTOSCOPY W/ FULGURATION / EVACUATION OF CLOTS, TURBT -case discussed with pt's rad onc at [facility] (Dr. Quitman Livings)  Drug induced hyperglycemia -check A1C--6.2 -novolog ISS -due to steroids -add lantus 5 units at hs  AKI -likely volume depletion with likely degree of obstructive  uropathy -resolved with foley  GOC -discussed advance planning with sister at bedside--understands and agrees with DNR    Disposition Plan: Home 3/16 or 3/17 if respiratory status improves Family Communication:Sister updatedat bedside 06/27/17   Consultants:none Code Status: DNR  DVT Prophylaxis: SCDs   Procedures: As Listed in Progress Note Above  Antibiotics: None      Subjective: Overall, patient is slowly improving.  He is not nearly as dyspneic with conversations.  He still has dyspnea walking to the bathroom.  He denies any fevers, chills, headache, chest pain, nausea, vomiting, diarrhea, abdominal pain.  There is no dysuria hematuria.  No rashes.  Objective: Vitals:   06/27/17 0522 06/27/17 0558 06/27/17 0728 06/27/17 1514  BP: 132/78     Pulse: (!) 108     Resp: (!) 22     Temp: 98 F (36.7 C)     TempSrc: Oral     SpO2: 92% 93% 95% (!) 84%  Weight: 82.5 kg (181 lb 15.5 oz)     Height:        Intake/Output Summary (Last 24 hours) at 06/27/2017 1806 Last data filed at 06/27/2017 1500 Gross per 24 hour  Intake 680 ml  Output 1975 ml  Net -1295 ml   Weight change:  Exam:   General:  Pt is alert, follows commands appropriately, not in acute distress  HEENT: No icterus, No thrush, No neck mass, [place]/AT  Cardiovascular: RRR, S1/S2, no rubs, no gallops  Respiratory: Bibasilar rales.  Bibasilar expiratory wheeze.  Diminished breath sounds at the bases.  Abdomen: Soft/+BS, non tender, non distended, no guarding  Extremities: No edema, No lymphangitis, No petechiae, No rashes, no synovitis   Data Reviewed: I have personally reviewed following labs and imaging studies Basic Metabolic Panel: Recent Labs  Lab 06/21/17 1657  06/22/17 0610 06/24/17 0554 06/26/17 1011 06/27/17 0500  NA 133* 137 135 135  --   K 4.4 5.2* 4.6 4.3  --   CL 94* 99* 94* 92*  --   CO2 28 28 29 31   --   GLUCOSE 155* 177* 196* 287*  --   BUN 22* 21* 26*  24*  --   CREATININE 1.41* 1.02 0.88 0.92  --   CALCIUM 9.6 9.6 9.5 9.4  --   MG  --   --   --  1.9 2.0  PHOS  --   --   --   --  3.0   Liver Function Tests: Recent Labs  Lab 06/21/17 1657 06/22/17 0610  AST 29 19  ALT 21 14*  ALKPHOS 79 66  BILITOT 0.5 0.6  PROT 8.0 7.0  ALBUMIN 3.6 3.1*   No results for input(s): LIPASE, AMYLASE in the last 168 hours. No results for input(s): AMMONIA in the last 168 hours. Coagulation Profile: No results for input(s): INR, PROTIME in the last 168 hours. CBC: Recent Labs  Lab 06/21/17 1657 06/22/17 0610 06/24/17 0554 06/26/17 1011 06/27/17 0500  WBC 18.6* 11.3* 15.6* 11.9* 13.6*  NEUTROABS 16.0*  --   --   --   --   HGB 8.2* 7.6* 7.8* 8.5* 8.2*  HCT 28.0* 25.7* 27.3* 29.3* 28.6*  MCV 83.3 82.9 84.0 83.5 83.6  PLT 524* 473* 501* 492* 489*   Cardiac Enzymes: Recent Labs  Lab 06/21/17 1657  TROPONINI <0.03   BNP: Invalid input(s): POCBNP CBG: Recent Labs  Lab 06/26/17 1958 06/27/17 0722 06/27/17 1122 06/27/17 1640  GLUCAP 416* 200* 286* 190*   HbA1C: Recent Labs    06/27/17 0500  HGBA1C 6.2*   Urine analysis:    Component Value Date/Time   COLORURINE RED (A) 06/21/2017 1629   APPEARANCEUR CLOUDY (A) 06/21/2017 1629   APPEARANCEUR Turbid (A) 01/25/2016 1407   LABSPEC >1.030 (H) 06/21/2017 1629   PHURINE 6.5 06/21/2017 1629   GLUCOSEU NEGATIVE 06/21/2017 1629   HGBUR LARGE (A) 06/21/2017 1629   BILIRUBINUR NEGATIVE 06/21/2017 1629   BILIRUBINUR Negative 01/25/2016 1407   KETONESUR NEGATIVE 06/21/2017 1629   PROTEINUR >300 (A) 06/21/2017 1629   UROBILINOGEN 0.2 05/04/2014 0956   NITRITE NEGATIVE 06/21/2017 1629   LEUKOCYTESUR TRACE (A) 06/21/2017 1629   LEUKOCYTESUR 3+ (A) 01/25/2016 1407   Sepsis Labs: @LABRCNTIP (procalcitonin:4,lacticidven:4) ) Recent Results (from the past 240 hour(s))  Culture, Urine     Status: Abnormal   Collection Time: 06/21/17  4:29 PM  Result Value Ref Range Status   Specimen  Description   Final    URINE, CLEAN CATCH Performed at [facility], [address] [place], [place] [address]    Special Requests   Final    NONE Performed at [facility], [address], [place], [place] [address]    Culture (A)  Final    <10,000 COLONIES/mL INSIGNIFICANT GROWTH Performed at [facility], [place] [address], [place], [place] [address]    Report Status 06/23/2017 FINAL  Final  MRSA PCR Screening     Status: None   Collection Time: 06/22/17 12:39 PM  Result Value Ref Range Status   MRSA by PCR NEGATIVE NEGATIVE Final    Comment:        The GeneXpert MRSA Assay (FDA approved for NASAL specimens only), is one component of a comprehensive MRSA colonization surveillance program. It is not intended to diagnose MRSA infection nor to guide or  monitor treatment for MRSA infections. Performed at [facility], [address] [place], [place] [address]      Scheduled Meds: . arformoterol  15 mcg Nebulization BID  . budesonide (PULMICORT) nebulizer solution  0.5 mg Nebulization BID  . DULoxetine  60 mg Oral Daily  . guaiFENesin  1,200 mg Oral BID  . insulin aspart  0-15 Units Subcutaneous TID WC  . insulin aspart  0-5 Units Subcutaneous QHS  . insulin glargine  5 Units Subcutaneous Daily  . ipratropium-albuterol  3 mL Nebulization Q6H  . mouth rinse  15 mL Mouth Rinse BID  . methylPREDNISolone (SOLU-MEDROL) injection  80 mg Intravenous Q6H  . nicotine  21 mg Transdermal Daily  . pantoprazole  40 mg Oral Daily  . simvastatin  40 mg Oral QHS   Continuous Infusions: . sodium chloride 10 mL/hr at 06/26/17 2051    Procedures/Studies: US Renal  Result Date: 06/22/2017 CLINICAL DATA:  Acute kidney injury. History of bladder tumor resection in 2013. EXAM: RENAL / URINARY TRACT ULTRASOUND COMPLETE COMPARISON:  CT abdomen dated 04/01/2017. FINDINGS: Right Kidney: Length: 10.8 cm. Echogenicity within normal limits. Benign cyst again noted within the midpole region,  measuring 1.5 cm. No suspicious mass or hydronephrosis visualized. Left Kidney: Length: 10.6 cm. Echogenicity within normal limits. Small benign cyst again noted within the lower pole, measuring 1 cm. No suspicious mass or hydronephrosis visualized. Bladder: Bladder decompressed by Foley catheter. IMPRESSION: 1. No acute or significant findings. Both kidneys demonstrate normal cortical thickness and echogenicity. Small bilateral renal cysts, as also described on CT of 04/01/2017. 2. Bladder decompressed by Foley catheter. Electronically Signed   By: Franki Cabot M.D.   On: 06/22/2017 12:14   Dg Chest Port 1 View  Result Date: 06/21/2017 CLINICAL DATA:  SOB, pt on c-pap mask EXAM: PORTABLE CHEST 1 VIEW COMPARISON:  04/23/2017 FINDINGS: Cardiac silhouette is normal in size. No mediastinal or hilar masses. There are prominent bronchovascular markings which are similar to prior exams. There is a possible component of interstitial edema. No evidence of pneumonia. No pleural effusion or pneumothorax. Skeletal structures are grossly intact. IMPRESSION: 1. Prominent bronchovascular markings similar to prior exams, likely chronic. A component of interstitial edema should be considered given the reported shortness of breath. No evidence of pneumonia. Electronically Signed   By: Lajean Manes M.D.   On: 06/21/2017 16:48    Orson Eva, DO  [facility] Pager [phone]  If 7PM-7AM, please contact night-coverage [email or web address] Password [email or web address] 06/27/2017, 6:06 PM   LOS: 6 days

## 2017-06-27 NOTE — Progress Notes (Signed)
Inpatient Diabetes Program Recommendations  AACE/ADA: New Consensus Statement on Inpatient Glycemic Control (2015)  Target Ranges:  Prepandial:   less than 140 mg/dL      Peak postprandial:   less than 180 mg/dL (1-2 hours)      Critically ill patients:  140 - 180 mg/dL   Results for KYVON, HU (MRN [id number]) as of 06/27/2017 08:24  Ref. Range 06/26/2017 19:58 06/27/2017 07:22  Glucose-Capillary Latest Ref Range: 65 - 99 mg/dL 416 (H) 200 (H)   Review of Glycemic Control  Diabetes history: NO Outpatient Diabetes medications: NA Current orders for Inpatient glycemic control: Novolog 0-9 units TID with meals, Novolog 0-5 units QHS; Solumedrol 80 mg Q6H  Inpatient Diabetes Program Recommendations: Insulin - Basal: If steroids are continued as ordered, please consider ordering Lantus 8 units daily (based on 82 kg x 0.1 units). Please note that if Lantus is ordered as recommended, it will need to be adjusted as steroids are tapered off. Insulin - Meal Coverage: If steroids are continued as ordered, please consider ordering Novolog 3 units TID with meals for meal coverage if patient eats at least 50% of meals.  Thanks, Barnie Alderman, RN, MSN, CDE Diabetes Coordinator Inpatient Diabetes Program [phone] (Team Pager from 8am to 5pm)

## 2017-06-27 NOTE — Care Management Important Message (Signed)
Important Message  Patient Details  Name: [name] [name] MRN: [id number] Date of Birth: [birth year]   Medicare Important Message Given:  Yes    Yavonne Kiss, Chauncey Reading, RN 06/27/2017, 12:36 PM

## 2017-06-28 DIAGNOSIS — E782 Mixed hyperlipidemia: Secondary | ICD-10-CM

## 2017-06-28 LAB — CBC
HCT: 27.8 % — ABNORMAL LOW (ref 39.0–52.0)
Hemoglobin: 8.1 g/dL — ABNORMAL LOW (ref 13.0–17.0)
MCH: 24.2 pg — ABNORMAL LOW (ref 26.0–34.0)
MCHC: 29.1 g/dL — ABNORMAL LOW (ref 30.0–36.0)
MCV: 83 fL (ref 78.0–100.0)
PLATELETS: 459 10*3/uL — AB (ref 150–400)
RBC: 3.35 MIL/uL — ABNORMAL LOW (ref 4.22–5.81)
RDW: 17.1 % — AB (ref 11.5–15.5)
WBC: 13.3 10*3/uL — AB (ref 4.0–10.5)

## 2017-06-28 LAB — GLUCOSE, CAPILLARY
GLUCOSE-CAPILLARY: 198 mg/dL — AB (ref 65–99)
GLUCOSE-CAPILLARY: 275 mg/dL — AB (ref 65–99)
Glucose-Capillary: 300 mg/dL — ABNORMAL HIGH (ref 65–99)
Glucose-Capillary: 198 mg/dL — ABNORMAL HIGH (ref 65–99)

## 2017-06-28 MED ORDER — PREDNISONE 20 MG PO TABS
50.0000 mg | ORAL_TABLET | Freq: Two times a day (BID) | ORAL | Status: DC
  Administered 2017-06-28 – 2017-06-30 (×4): 50 mg via ORAL
  Filled 2017-06-28 (×4): qty 2

## 2017-06-28 NOTE — Progress Notes (Signed)
PROGRESS NOTE  [name] [name] BJY:[id number] DOB: 1941-01-10 DOA: 06/21/2017 PCP: [name], MD   Brief History: 77 year old male with a history of bladder cancer, chronic respiratory failure on 4 L oxygen, hyperlipidemia, depression, hyperlipidemia presenting with shortness of breath. The patient was noted to be in respiratory distress upon arrival. He was placed on BiPAP. He was admitted to the stepdown unit. The patient was started on bronchodilators and intravenous Solu-Medrol. He was also noted to have urinary retention. A Foley catheter was placed in the emergency department on 06/21/2017.Patient had very slow improvement. The patient's Solu-Medrol was increased;Pulmicort and Brovana were added.  Assessment/Plan: Acute on chronic respiratory failure with hypoxia -Secondary to COPD exacerbation -Normally on 4 L nasal cannula at home -Presented with respiratory distress requiring BiPAP -Presently stable on 5 L>>>4L -Wean oxygen back to baseline level -check Mag--2.0, phos 3.0  COPD exacerbation -Very slow improvement, but continues to improve -Very low functional reserve -Continue duo nebs -ContinuePulmicort -ContinueBrovana -Continue IV Solu-Medrol  Pyuria -Secondary to bladder cancer -urine cultures here and at [facility] unrevealing  Urinary retention/hematuria/high grade urothelial carcinoma -Foley catheter has been placed on 3/9 -I discussed risks, benefits and alternatives of leaving in foley catheter--pt and family understand and want to leave foley in at time of d/c -Will need outpatient follow-up with urology for voiding trial.His urologist is at [facility]. -he was scheduled to start radiation to his bladder on 3/11, this has been delayed due to his current hospitalization. -04/26/17--CYSTOSCOPY W/ FULGURATION / EVACUATION OF CLOTS, TURBT -case discussed with pt's rad onc  (Dr. Quitman Livings) -Hemoglobin has remained stable  Drug induced  hyperglycemia -check A1C--6.2 -novolog ISS -due to steroids -added lantus 5 units at hs -will not adjust as weaning steroids  AKI -likely volume depletion with likely degree of obstructive uropathy -resolved with foley  GOC -discussed advance planning with sister at bedside--understands and agrees with DNR    Disposition Plan: Home 3/17 if respiratory status stable Family Communication:Sister/Daughter updatedat bedside 06/28/17  Consultants:none Code Status: DNR  DVT Prophylaxis: SCDs   Procedures: As Listed in Progress Note Above  Antibiotics: None     Subjective: Patient states that he is breathing better.  He is not short of breath speaking any further.  He still has shortness of breath with exertion.  Denies any ears, chills, headache, chest pain, nausea, vomiting, diarrhea, abdominal pain.  There is no dysuria.  Objective: Vitals:   06/27/17 2121 06/28/17 0143 06/28/17 0516 06/28/17 0950  BP: (!) 144/74  (!) 150/76   Pulse: 86  84   Resp:      Temp:   98.4 F (36.9 C)   TempSrc:   Oral   SpO2: 96% 95% 94% 90%  Weight:      Height:        Intake/Output Summary (Last 24 hours) at 06/28/2017 1240 Last data filed at 06/28/2017 0516 Gross per 24 hour  Intake 320 ml  Output 1750 ml  Net -1430 ml   Weight change:  Exam:   General:  Pt is alert, follows commands appropriately, not in acute distress  HEENT: No icterus, No thrush, No neck mass, [place]/AT  Cardiovascular: RRR, S1/S2, no rubs, no gallops  Respiratory: Bibasilar rales.  Good air movement.  Mild expiratory wheeze at the bases.  Abdomen: Soft/+BS, non tender, non distended, no guarding  Extremities: No edema, No lymphangitis, No petechiae, No rashes, no synovitis   Data Reviewed: I have personally  reviewed following labs and imaging studies Basic Metabolic Panel: Recent Labs  Lab 06/21/17 1657 06/22/17 0610 06/24/17 0554 06/26/17 1011 06/27/17 0500  NA 133* 137  135 135  --   K 4.4 5.2* 4.6 4.3  --   CL 94* 99* 94* 92*  --   CO2 28 28 29 31   --   GLUCOSE 155* 177* 196* 287*  --   BUN 22* 21* 26* 24*  --   CREATININE 1.41* 1.02 0.88 0.92  --   CALCIUM 9.6 9.6 9.5 9.4  --   MG  --   --   --  1.9 2.0  PHOS  --   --   --   --  3.0   Liver Function Tests: Recent Labs  Lab 06/21/17 1657 06/22/17 0610  AST 29 19  ALT 21 14*  ALKPHOS 79 66  BILITOT 0.5 0.6  PROT 8.0 7.0  ALBUMIN 3.6 3.1*   No results for input(s): LIPASE, AMYLASE in the last 168 hours. No results for input(s): AMMONIA in the last 168 hours. Coagulation Profile: No results for input(s): INR, PROTIME in the last 168 hours. CBC: Recent Labs  Lab 06/21/17 1657 06/22/17 0610 06/24/17 0554 06/26/17 1011 06/27/17 0500 06/28/17 0614  WBC 18.6* 11.3* 15.6* 11.9* 13.6* 13.3*  NEUTROABS 16.0*  --   --   --   --   --   HGB 8.2* 7.6* 7.8* 8.5* 8.2* 8.1*  HCT 28.0* 25.7* 27.3* 29.3* 28.6* 27.8*  MCV 83.3 82.9 84.0 83.5 83.6 83.0  PLT 524* 473* 501* 492* 489* 459*   Cardiac Enzymes: Recent Labs  Lab 06/21/17 1657  TROPONINI <0.03   BNP: Invalid input(s): POCBNP CBG: Recent Labs  Lab 06/27/17 1122 06/27/17 1640 06/27/17 2120 06/28/17 0723 06/28/17 1126  GLUCAP 286* 190* 298* 198* 275*   HbA1C: Recent Labs    06/27/17 0500  HGBA1C 6.2*   Urine analysis:    Component Value Date/Time   COLORURINE RED (A) 06/21/2017 1629   APPEARANCEUR CLOUDY (A) 06/21/2017 1629   APPEARANCEUR Turbid (A) 01/25/2016 1407   LABSPEC >1.030 (H) 06/21/2017 1629   PHURINE 6.5 06/21/2017 1629   GLUCOSEU NEGATIVE 06/21/2017 1629   HGBUR LARGE (A) 06/21/2017 1629   BILIRUBINUR NEGATIVE 06/21/2017 1629   BILIRUBINUR Negative 01/25/2016 1407   KETONESUR NEGATIVE 06/21/2017 1629   PROTEINUR >300 (A) 06/21/2017 1629   UROBILINOGEN 0.2 05/04/2014 0956   NITRITE NEGATIVE 06/21/2017 1629   LEUKOCYTESUR TRACE (A) 06/21/2017 1629   LEUKOCYTESUR 3+ (A) 01/25/2016 1407   Sepsis  Labs: @LABRCNTIP (procalcitonin:4,lacticidven:4) ) Recent Results (from the past 240 hour(s))  Culture, Urine     Status: Abnormal   Collection Time: 06/21/17  4:29 PM  Result Value Ref Range Status   Specimen Description   Final    URINE, CLEAN CATCH Performed at [facility], [address], [place], [place] [address]    Special Requests   Final    NONE Performed at [facility], [address], [place], [place] [address]    Culture (A)  Final    <10,000 COLONIES/mL INSIGNIFICANT GROWTH Performed at [facility], [address], [place], [place] [address]    Report Status 06/23/2017 FINAL  Final  MRSA PCR Screening     Status: None   Collection Time: 06/22/17 12:39 PM  Result Value Ref Range Status   MRSA by PCR NEGATIVE NEGATIVE Final    Comment:        The GeneXpert MRSA Assay (  FDA approved for NASAL specimens only), is one component of a comprehensive MRSA colonization surveillance program. It is not intended to diagnose MRSA infection nor to guide or monitor treatment for MRSA infections. Performed at [facility], [address] [place], [place] [address]      Scheduled Meds: . arformoterol  15 mcg Nebulization BID  . budesonide (PULMICORT) nebulizer solution  0.5 mg Nebulization BID  . DULoxetine  60 mg Oral Daily  . guaiFENesin  1,200 mg Oral BID  . insulin aspart  0-15 Units Subcutaneous TID WC  . insulin aspart  0-5 Units Subcutaneous QHS  . insulin glargine  5 Units Subcutaneous Daily  . ipratropium-albuterol  3 mL Nebulization Q6H  . mouth rinse  15 mL Mouth Rinse BID  . methylPREDNISolone (SOLU-MEDROL) injection  80 mg Intravenous Q6H  . nicotine  21 mg Transdermal Daily  . pantoprazole  40 mg Oral Daily  . simvastatin  40 mg Oral QHS   Continuous Infusions: . sodium chloride 10 mL/hr at 06/26/17 2051    Procedures/Studies: US Renal  Result Date: 06/22/2017 CLINICAL DATA:  Acute kidney injury. History of bladder tumor resection in 2013.  EXAM: RENAL / URINARY TRACT ULTRASOUND COMPLETE COMPARISON:  CT abdomen dated 04/01/2017. FINDINGS: Right Kidney: Length: 10.8 cm. Echogenicity within normal limits. Benign cyst again noted within the midpole region, measuring 1.5 cm. No suspicious mass or hydronephrosis visualized. Left Kidney: Length: 10.6 cm. Echogenicity within normal limits. Small benign cyst again noted within the lower pole, measuring 1 cm. No suspicious mass or hydronephrosis visualized. Bladder: Bladder decompressed by Foley catheter. IMPRESSION: 1. No acute or significant findings. Both kidneys demonstrate normal cortical thickness and echogenicity. Small bilateral renal cysts, as also described on CT of 04/01/2017. 2. Bladder decompressed by Foley catheter. Electronically Signed   By: Franki Cabot M.D.   On: 06/22/2017 12:14   Dg Chest Port 1 View  Result Date: 06/21/2017 CLINICAL DATA:  SOB, pt on c-pap mask EXAM: PORTABLE CHEST 1 VIEW COMPARISON:  04/23/2017 FINDINGS: Cardiac silhouette is normal in size. No mediastinal or hilar masses. There are prominent bronchovascular markings which are similar to prior exams. There is a possible component of interstitial edema. No evidence of pneumonia. No pleural effusion or pneumothorax. Skeletal structures are grossly intact. IMPRESSION: 1. Prominent bronchovascular markings similar to prior exams, likely chronic. A component of interstitial edema should be considered given the reported shortness of breath. No evidence of pneumonia. Electronically Signed   By: Lajean Manes M.D.   On: 06/21/2017 16:48    Orson Eva, DO  [facility] Pager [phone]  If 7PM-7AM, please contact night-coverage [email or web address] Password [email or web address] 06/28/2017, 12:40 PM   LOS: 7 days

## 2017-06-29 LAB — GLUCOSE, CAPILLARY
Glucose-Capillary: 157 mg/dL — ABNORMAL HIGH (ref 65–99)
Glucose-Capillary: 277 mg/dL — ABNORMAL HIGH (ref 65–99)
Glucose-Capillary: 155 mg/dL — ABNORMAL HIGH (ref 65–99)
Glucose-Capillary: 200 mg/dL — ABNORMAL HIGH (ref 65–99)

## 2017-06-29 MED ORDER — ALPRAZOLAM 1 MG PO TABS: 1.0000 mg | ORAL_TABLET | Freq: Three times a day (TID) | ORAL | 0 refills | Status: DC | PRN

## 2017-06-29 MED ORDER — PREDNISONE 50 MG PO TABS: 50.0000 mg | ORAL_TABLET | Freq: Two times a day (BID) | ORAL | 0 refills | Status: DC

## 2017-06-29 NOTE — Evaluation (Signed)
Physical Therapy Evaluation Patient Details Name: [name] [name] MRN: [id number] DOB: Feb 11, 1941 Today's Date: 06/29/2017   History of Present Illness  [name] [name]  is a 77 y.o. male, with history of cancer, chemotherapy scheduled on Monday as outpatient, COPD on chronic home O2, hyperlipidemia, depression came to hospital with worsening shortness of breath.  Patient says that shortness of breath did not improve with albuterol nebulizer at home.  Patient also complains of inability to urinate, has history of bladder cancer.  In the ED patient was found to be in urinary retention, Foley catheter was placed.  Patient was initially put on BiPAP which was later removed, currently O2 sats 99% on nonrebreather  Clinical Impression  Pt limitation is more respiratory.  He has good strength and fair + balance, He is mod I in bed mobility and only able to ambulate 50 ft. Prior to SOB.  PT able to sit and recover after 2 minutes.     Follow Up Recommendations Home health PT;Supervision for mobility/OOB;SNF Recommend someone to be with pt for mobility for first 48 hrs    Equipment Recommendations  None recommended by PT    Recommendations for Other Services  none     Precautions / Restrictions Precautions Precautions: Other (comment) Precaution Comments: respiratory  Restrictions Weight Bearing Restrictions: No      Mobility  Bed Mobility Overal bed mobility: Modified Independent                Transfers Overall transfer level: Modified independent                  Ambulation/Gait Ambulation/Gait assistance: Modified independent (Device/Increase time) Ambulation Distance (Feet): 50 Feet Assistive device: Rolling walker (2 wheeled) Gait Pattern/deviations: Decreased step length - right;Decreased step length - left;Step-through pattern   Gait velocity interpretation: Below normal speed for age/gender General Gait Details: With O2  SOB at end of session   Stairs                  Pertinent Vitals/Pain Pain Assessment: No/denies pain    Home Living Family/patient expects to be discharged to:: Private residence Living Arrangements: Alone Available Help at Discharge: Family Type of Home: House Home Access: Ramped entrance     Home Layout: One level Home Equipment: Environmental consultant - 2 wheels;Bedside commode;Other (comment) Additional Comments: O2    Prior Function Level of Independence: Independent with assistive device(s)                  Extremity/Trunk Assessment        Lower Extremity Assessment Lower Extremity Assessment: Overall WFL for tasks assessed(limitations are respiratory in nature not physical )       Communication   Communication: No difficulties  Cognition Arousal/Alertness: Awake/alert Behavior During Therapy: WFL for tasks assessed/performed Overall Cognitive Status: Within Functional Limits for tasks assessed                                               Assessment/Plan    PT Assessment Patient needs continued PT services  PT Problem List Decreased activity tolerance       PT Treatment Interventions Therapeutic activities;Functional mobility training    PT Goals (Current goals can be found in the Care Plan section)  Acute Rehab PT Goals PT Goal Formulation: With patient Time For Goal Achievement: 07/03/17 Potential to Achieve  Goals: Good    Frequency Min 3X/week   Barriers to discharge                                         End of Session Equipment Utilized During Treatment: Gait belt Activity Tolerance: Treatment limited secondary to medical complications (Comment) Patient left: in bed;with call bell/phone within reach Nurse Communication: Mobility status PT Visit Diagnosis: Difficulty in walking, not elsewhere classified (R26.2)    Time: 2353-6144 PT Time Calculation (min) (ACUTE ONLY): 22 min   Charges:   PT Evaluation $PT Eval Low Complexity: Gray, PT CLT [phone] 06/29/2017, 11:57 AM

## 2017-06-29 NOTE — Discharge Summary (Signed)
Physician Discharge Summary  [name] [name] WNI:[id number] DOB: [birth year] DOA: 06/21/2017  PCP: [name], MD  Admit date: 06/21/2017 Discharge date: 06/29/2017  Admitted From: Home Disposition:  SNF  Recommendations for Outpatient Follow-up:  1. Follow up with PCP in 1-2 weeks 2. Please obtain BMP/CBC in one week    Discharge Condition: Stable CODE STATUS: FULL Diet recommendation: Heart Healthy    Brief/Interim Summary: 77 year old male with a history of bladder cancer, chronic respiratory failure on 4 L oxygen, hyperlipidemia, depression, hyperlipidemia presenting with shortness of breath. The patient was noted to be in respiratory distress upon arrival. He was placed on BiPAP. He was admitted to the stepdown unit. The patient was started on bronchodilators and intravenous Solu-Medrol. He was also noted to have urinary retention. A Foley catheter was placed in the emergency department on 06/21/2017.Patient had very slow improvement. The patient's Solu-Medrol was increased;Pulmicort and Brovana were added after which the patient continued to improve clinically.  His solumedrol was weaned to po prednisone.  He will be discharge with prednisone taper.    Discharge Diagnoses:  Acute on chronic respiratory failure with hypoxia -Secondary to COPD exacerbation -Normally on 4 L nasal cannula at home -Presented with respiratory distress requiring BiPAP -Presently stable on5L>>>4L -Weaned oxygen back to baseline level -check Mag--2.0, phos 3.0  COPD exacerbation -Very slow improvement, but continues to improve -Very low functional reserve -Continued duo nebs during hospitalization -ContinuedPulmicort -ContinuedBrovana -Continue IV Solu-Medrol>>>po prednisone -d/c with prednisone taper -restart Breo and Incruse  Pyuria -Secondary to bladder cancer -urine cultures here and at [facility] unrevealing  Urinary retention/hematuria/high grade urothelial  carcinoma -Foley catheter has been placed on 3/9 -I discussed risks, benefits and alternatives of leaving in foley catheter--pt and family understand and want to leave foley in at time of d/c -Will need outpatient follow-up with urology for voiding trial.His urologist is at [facility]. -he was scheduled to start radiation to his bladder on 3/11, this has been delayed due to his current hospitalization. -04/26/17--CYSTOSCOPY W/ FULGURATION / EVACUATION OF CLOTS, TURBT -case discussed with pt's rad onc  (Dr. Quitman Livings) -Hemoglobin has remained stable throughout the hospitalization  Drug induced hyperglycemia -check A1C--6.2 -novolog ISS -due to steroids -added lantus 5 units at hs -CBGs improving with weaning steroids  AKI -likely volume depletion with likely degree of obstructive uropathy -resolved with foley  GOC -discussed advance planning with sister at bedside--understands and agrees with DNR      Discharge Instructions  Discharge Instructions    Diet - low sodium heart healthy   Complete by:  As directed    Increase activity slowly   Complete by:  As directed      Allergies as of 06/29/2017      Reactions   Metrizamide Hives   Iohexol Hives    Code: HIVES, Desc: PT STATES HE BROKE OUT IN HIVES AND RASH AFTER CT NECK EARLY SEPT 2011; NO RESP PROBLEMS; NEEDS PRE-MEDS; MKS, Onset Date: [id number]   Ivp Dye [iodinated Diagnostic Agents] Hives      Medication List    STOP taking these medications   HYDROcodone-acetaminophen 5-325 MG tablet Commonly known as:  NORCO/VICODIN   levofloxacin 500 MG tablet Commonly known as:  LEVAQUIN   LORazepam 0.5 MG tablet Commonly known as:  ATIVAN   methylPREDNISolone sodium succinate 125 mg/2 mL injection Commonly known as:  SOLU-MEDROL   nitrofurantoin (macrocrystal-monohydrate) 100 MG capsule Commonly known as:  MACROBID   sulfamethoxazole-trimethoprim 800-160 MG tablet Commonly known as:  BACTRIM DS,SEPTRA  DS      TAKE these medications   acetaminophen 325 MG tablet Commonly known as:  TYLENOL Take 650 mg by mouth every 6 (six) hours as needed for headache.   albuterol (2.5 MG/3ML) 0.083% nebulizer solution Commonly known as:  PROVENTIL Take 3 mLs (2.5 mg total) by nebulization every 4 (four) hours as needed. For shortness of breath   VENTOLIN HFA 108 (90 Base) MCG/ACT inhaler Generic drug:  albuterol Inhale 2 puffs into the lungs every 6 (six) hours as needed for wheezing or shortness of breath.   ALPRAZolam 1 MG tablet Commonly known as:  XANAX Take 1 tablet (1 mg total) by mouth 3 (three) times daily as needed for anxiety or sleep.   cyanocobalamin 500 MCG tablet Take 500 mcg by mouth every morning.   docusate sodium 100 MG capsule Commonly known as:  COLACE Take 100 mg by mouth daily as needed for mild constipation or moderate constipation.   DULoxetine 60 MG capsule Commonly known as:  CYMBALTA Take 1 capsule (60 mg total) by mouth daily.   fluticasone furoate-vilanterol 200-25 MCG/INH Aepb Commonly known as:  BREO ELLIPTA Inhale 1 puff into the lungs daily.   guaiFENesin-dextromethorphan 100-10 MG/5ML syrup Commonly known as:  ROBITUSSIN DM Take 10 mLs by mouth every 6 (six) hours.   multivitamin with minerals Tabs tablet Take 1 tablet by mouth daily.   NITROSTAT 0.4 MG SL tablet Generic drug:  nitroGLYCERIN Place 1 tablet under the tongue every 5 (five) minutes as needed for chest pain.   omeprazole 20 MG capsule Commonly known as:  PRILOSEC Take 20 mg by mouth daily.   predniSONE 50 MG tablet Commonly known as:  DELTASONE Take 1 tablet (50 mg total) by mouth 2 (two) times daily with a meal. X 2 days, then 40 mg by mouth twice daily x 2 days, then 30 mg twice daily x 2 days, then 20 mg twice daily x 2 days, then 10 mg twice daily x 2 days   prochlorperazine 10 MG tablet Commonly known as:  COMPAZINE Take 1 hour before each days radiation treatment for nausea    roflumilast 500 MCG Tabs tablet Commonly known as:  DALIRESP Take 1 Tablet by mouth once daily   simvastatin 40 MG tablet Commonly known as:  ZOCOR Take 40 mg by mouth at bedtime.   umeclidinium bromide 62.5 MCG/INH Aepb Commonly known as:  INCRUSE ELLIPTA Inhale 1 puff into the lungs daily.            Durable Medical Equipment  (From admission, onward)        Start     Ordered   06/27/17 1101  For home use only DME Hospital bed  Once    Question Answer Comment  The above medical condition requires: Patient requires the ability to reposition frequently   Head must be elevated greater than: 30 degrees   Bed type Semi-electric      06/27/17 1100      Allergies  Allergen Reactions  . Metrizamide Hives  . Iohexol Hives     Code: HIVES, Desc: PT STATES HE BROKE OUT IN HIVES AND RASH AFTER CT NECK EARLY SEPT 2011; NO RESP PROBLEMS; NEEDS PRE-MEDS; MKS, Onset Date: [id number]   . Ivp Dye [Iodinated Diagnostic Agents] Hives    Consultations:  none   Procedures/Studies: US Renal  Result Date: 06/22/2017 CLINICAL DATA:  Acute kidney injury. History of bladder tumor resection in 2013. EXAM: RENAL / URINARY TRACT ULTRASOUND COMPLETE  COMPARISON:  CT abdomen dated 04/01/2017. FINDINGS: Right Kidney: Length: 10.8 cm. Echogenicity within normal limits. Benign cyst again noted within the midpole region, measuring 1.5 cm. No suspicious mass or hydronephrosis visualized. Left Kidney: Length: 10.6 cm. Echogenicity within normal limits. Small benign cyst again noted within the lower pole, measuring 1 cm. No suspicious mass or hydronephrosis visualized. Bladder: Bladder decompressed by Foley catheter. IMPRESSION: 1. No acute or significant findings. Both kidneys demonstrate normal cortical thickness and echogenicity. Small bilateral renal cysts, as also described on CT of 04/01/2017. 2. Bladder decompressed by Foley catheter. Electronically Signed   By: Franki Cabot M.D.   On: 06/22/2017  12:14   Dg Chest Port 1 View  Result Date: 06/21/2017 CLINICAL DATA:  SOB, pt on c-pap mask EXAM: PORTABLE CHEST 1 VIEW COMPARISON:  04/23/2017 FINDINGS: Cardiac silhouette is normal in size. No mediastinal or hilar masses. There are prominent bronchovascular markings which are similar to prior exams. There is a possible component of interstitial edema. No evidence of pneumonia. No pleural effusion or pneumothorax. Skeletal structures are grossly intact. IMPRESSION: 1. Prominent bronchovascular markings similar to prior exams, likely chronic. A component of interstitial edema should be considered given the reported shortness of breath. No evidence of pneumonia. Electronically Signed   By: Lajean Manes M.D.   On: 06/21/2017 16:48        Discharge Exam: Vitals:   06/29/17 0739 06/29/17 0744  BP:    Pulse:    Resp:    Temp:    SpO2: (!) 6% 95%   Vitals:   06/29/17 0506 06/29/17 0735 06/29/17 0739 06/29/17 0744  BP: (!) 147/80     Pulse: 86     Resp: 18     Temp: 97.6 F (36.4 C)     TempSrc: Oral     SpO2: 94% 95% (!) 6% 95%  Weight: 83 kg (182 lb 15.7 oz)     Height:        General: Pt is alert, awake, not in acute distress Cardiovascular: RRR, S1/S2 +, no rubs, no gallops Respiratory: CTA bilaterally, no wheezing, no rhonchi Abdominal: Soft, NT, ND, bowel sounds + Extremities: no edema, no cyanosis   The results of significant diagnostics from this hospitalization (including imaging, microbiology, ancillary and laboratory) are listed below for reference.    Significant Diagnostic Studies: US Renal  Result Date: 06/22/2017 CLINICAL DATA:  Acute kidney injury. History of bladder tumor resection in 2013. EXAM: RENAL / URINARY TRACT ULTRASOUND COMPLETE COMPARISON:  CT abdomen dated 04/01/2017. FINDINGS: Right Kidney: Length: 10.8 cm. Echogenicity within normal limits. Benign cyst again noted within the midpole region, measuring 1.5 cm. No suspicious mass or hydronephrosis  visualized. Left Kidney: Length: 10.6 cm. Echogenicity within normal limits. Small benign cyst again noted within the lower pole, measuring 1 cm. No suspicious mass or hydronephrosis visualized. Bladder: Bladder decompressed by Foley catheter. IMPRESSION: 1. No acute or significant findings. Both kidneys demonstrate normal cortical thickness and echogenicity. Small bilateral renal cysts, as also described on CT of 04/01/2017. 2. Bladder decompressed by Foley catheter. Electronically Signed   By: Franki Cabot M.D.   On: 06/22/2017 12:14   Dg Chest Port 1 View  Result Date: 06/21/2017 CLINICAL DATA:  SOB, pt on c-pap mask EXAM: PORTABLE CHEST 1 VIEW COMPARISON:  04/23/2017 FINDINGS: Cardiac silhouette is normal in size. No mediastinal or hilar masses. There are prominent bronchovascular markings which are similar to prior exams. There is a possible component of interstitial edema. No  evidence of pneumonia. No pleural effusion or pneumothorax. Skeletal structures are grossly intact. IMPRESSION: 1. Prominent bronchovascular markings similar to prior exams, likely chronic. A component of interstitial edema should be considered given the reported shortness of breath. No evidence of pneumonia. Electronically Signed   By: Lajean Manes M.D.   On: 06/21/2017 16:48     Microbiology: Recent Results (from the past 240 hour(s))  Culture, Urine     Status: Abnormal   Collection Time: 06/21/17  4:29 PM  Result Value Ref Range Status   Specimen Description   Final    URINE, CLEAN CATCH Performed at [facility], [address], [place], [place] [address]    Special Requests   Final    NONE Performed at [facility], [address] [place], [place] [address]    Culture (A)  Final    <10,000 COLONIES/mL INSIGNIFICANT GROWTH Performed at [facility] [address], [place], [place] [address]    Report Status 06/23/2017 FINAL  Final  MRSA PCR Screening     Status: None   Collection Time: 06/22/17 12:39  PM  Result Value Ref Range Status   MRSA by PCR NEGATIVE NEGATIVE Final    Comment:        The GeneXpert MRSA Assay (FDA approved for NASAL specimens only), is one component of a comprehensive MRSA colonization surveillance program. It is not intended to diagnose MRSA infection nor to guide or monitor treatment for MRSA infections. Performed at [facility], [address], [place], [place] [address]      Labs: Basic Metabolic Panel: Recent Labs  Lab 06/24/17 0554 06/26/17 1011 06/27/17 0500  NA 135 135  --   K 4.6 4.3  --   CL 94* 92*  --   CO2 29 31  --   GLUCOSE 196* 287*  --   BUN 26* 24*  --   CREATININE 0.88 0.92  --   CALCIUM 9.5 9.4  --   MG  --  1.9 2.0  PHOS  --   --  3.0   Liver Function Tests: No results for input(s): AST, ALT, ALKPHOS, BILITOT, PROT, ALBUMIN in the last 168 hours. No results for input(s): LIPASE, AMYLASE in the last 168 hours. No results for input(s): AMMONIA in the last 168 hours. CBC: Recent Labs  Lab 06/24/17 0554 06/26/17 1011 06/27/17 0500 06/28/17 0614  WBC 15.6* 11.9* 13.6* 13.3*  HGB 7.8* 8.5* 8.2* 8.1*  HCT 27.3* 29.3* 28.6* 27.8*  MCV 84.0 83.5 83.6 83.0  PLT 501* 492* 489* 459*   Cardiac Enzymes: No results for input(s): CKTOTAL, CKMB, CKMBINDEX, TROPONINI in the last 168 hours. BNP: Invalid input(s): POCBNP CBG: Recent Labs  Lab 06/28/17 0723 06/28/17 1126 06/28/17 1643 06/28/17 1943 06/29/17 0722  GLUCAP 198* 275* 300* 198* 157*    Time coordinating discharge:  Greater than 30 minutes  Signed:  Orson Eva, DO [facility] Pager: [phone] 06/29/2017, 10:08 AM

## 2017-06-30 LAB — GLUCOSE, CAPILLARY
GLUCOSE-CAPILLARY: 137 mg/dL — AB (ref 65–99)
Glucose-Capillary: 231 mg/dL — ABNORMAL HIGH (ref 65–99)

## 2017-06-30 NOTE — NC FL2 (Signed)
[place] MEDICAID FL2 LEVEL OF CARE SCREENING TOOL     IDENTIFICATION  Patient Name: [name] [name] Birthdate: 12-14-1940 Sex: male Admission Date (Current Location): 06/21/2017  [facility] and [place] Number:  [employer] and Address:  [address], [place]      Provider Number: [id number]  Attending Physician Name and Address:  Orson Eva, MD  Relative Name and Phone Number:       Current Level of Care: Hospital Recommended Level of Care: [place] Prior Approval Number:    Date Approved/Denied:   PASRR Number: [id number]  Discharge Plan: SNF    Current Diagnoses: Patient Active Problem List   Diagnosis Date Noted  . Urothelial carcinoma ([place]) 06/25/2017  . AKI (acute kidney injury) ([place])   . Severe anemia 04/22/2017  . Shortness of breath   . Acute on chronic respiratory failure ([place]) 10/24/2015  . Pressure ulcer 09/25/2015  . Respiratory failure ([place]) 09/24/2015  . Acute on chronic respiratory failure with hypercapnia ([place])   . Frequent falls 09/21/2015  . HLD (hyperlipidemia) 08/18/2015  . Chronic anemia 08/18/2015  . COPD exacerbation ([place]) 08/18/2015  . Chronic pain syndrome 05/01/2015  . Chronic respiratory failure with hypoxia ([place])   . Acute on chronic respiratory failure with hypoxia ([place]) 02/25/2014  . COPD (chronic obstructive pulmonary disease) ([place]) 02/09/2014  . Hyperglycemia, drug-induced 03/26/2013  . Chronic back pain   . Anxiety 03/13/2012  . Hyperlipidemia 03/13/2012  . BPH (benign prostatic hyperplasia) 10/01/2011  . Tobacco abuse 09/29/2011  . Bladder tumor 09/28/2011  . PULMONARY NODULE 12/27/2009  . Essential hypertension 12/26/2009  . OSTEOARTHRITIS 12/26/2009    Orientation RESPIRATION BLADDER Height & Weight     Self, Time, Situation, Place  O2(4L) Continent Weight: 184 lb 4.9 oz (83.6 kg) Height:  5\' 9"  (175.3 cm)  BEHAVIORAL SYMPTOMS/MOOD NEUROLOGICAL BOWEL  NUTRITION STATUS      Continent Diet(Heart Healthy)  AMBULATORY STATUS COMMUNICATION OF NEEDS Skin   Independent(Modified Independent )   Normal                       Personal Care Assistance Level of Assistance  Bathing, Feeding, Dressing Bathing Assistance: Independent(Modified independent) Feeding assistance: Independent Dressing Assistance: Independent(Modified Independent)     Functional Limitations Info  Sight, Hearing, Speech Sight Info: Adequate Hearing Info: Adequate Speech Info: Adequate    SPECIAL CARE FACTORS FREQUENCY  PT (By licensed PT)                    Contractures Contractures Info: Not present    Additional Factors Info  Code Status, Allergies, Psychotropic Code Status Info: DNR Allergies Info: Metrizamide, Iohexol, Ivp Dye Psychotropic Info: Xanax         Current Medications (06/30/2017):  This is the current hospital active medication list Current Facility-Administered Medications  Medication Dose Route Frequency Provider Last Rate Last Dose  . 0.9 %  sodium chloride infusion   Intravenous Continuous Oswald Hillock, MD 10 mL/hr at 06/26/17 2051    . acetaminophen (TYLENOL) tablet 650 mg  650 mg Oral Q6H PRN Oswald Hillock, MD   650 mg at 06/29/17 2215  . albuterol (PROVENTIL) (2.5 MG/3ML) 0.083% nebulizer solution 2.5 mg  2.5 mg Nebulization Q4H PRN Tat, Shanon Brow, MD   2.5 mg at 06/26/17 1147  . ALPRAZolam Duanne Moron) tablet 1 mg  1 mg Oral TID PRN Oswald Hillock, MD  1 mg at 06/30/17 0808  . arformoterol (BROVANA) nebulizer solution 15 mcg  15 mcg Nebulization BID Orson Eva, MD   15 mcg at 06/30/17 0756  . benzonatate (TESSALON) capsule 100 mg  100 mg Oral TID PRN Isaac Bliss, Rayford Halsted, MD   100 mg at 06/25/17 1722  . budesonide (PULMICORT) nebulizer solution 0.5 mg  0.5 mg Nebulization BID Tat, David, MD   0.5 mg at 06/30/17 0751  . docusate sodium (COLACE) capsule 100 mg  100 mg Oral Daily PRN Oswald Hillock, MD   100 mg at 06/30/17 1024  .  DULoxetine (CYMBALTA) DR capsule 60 mg  60 mg Oral Daily Oswald Hillock, MD   60 mg at 06/30/17 1024  . guaiFENesin (MUCINEX) 12 hr tablet 1,200 mg  1,200 mg Oral BID Isaac Bliss, Rayford Halsted, MD   1,200 mg at 06/30/17 1024  . insulin aspart (novoLOG) injection 0-15 Units  0-15 Units Subcutaneous TID WC Orson Eva, MD   5 Units at 06/30/17 1148  . insulin aspart (novoLOG) injection 0-5 Units  0-5 Units Subcutaneous Benay Pike, MD   3 Units at 06/27/17 2214  . insulin glargine (LANTUS) injection 5 Units  5 Units Subcutaneous Daily Tat, David, MD   5 Units at 06/30/17 1028  . ipratropium-albuterol (DUONEB) 0.5-2.5 (3) MG/3ML nebulizer solution 3 mL  3 mL Nebulization Q6H Oswald Hillock, MD   3 mL at 06/30/17 0751  . MEDLINE mouth rinse  15 mL Mouth Rinse BID Oswald Hillock, MD   15 mL at 06/30/17 1025  . nicotine (NICODERM CQ - dosed in mg/24 hours) patch 21 mg  21 mg Transdermal Daily Schorr, Rhetta Mura, NP   21 mg at 06/30/17 0809  . ondansetron (ZOFRAN) tablet 4 mg  4 mg Oral Q6H PRN Oswald Hillock, MD       Or  . ondansetron (ZOFRAN) injection 4 mg  4 mg Intravenous Q6H PRN Oswald Hillock, MD      . pantoprazole (PROTONIX) EC tablet 40 mg  40 mg Oral Daily Oswald Hillock, MD   40 mg at 06/30/17 1024  . predniSONE (DELTASONE) tablet 50 mg  50 mg Oral BID WC Orson Eva, MD   50 mg at 06/30/17 9678  . simvastatin (ZOCOR) tablet 40 mg  40 mg Oral QHS Oswald Hillock, MD   40 mg at 06/29/17 2215  . sodium chloride (OCEAN) 0.65 % nasal spray 1 spray  1 spray Each Nare PRN Isaac Bliss, Rayford Halsted, MD   1 spray at 06/24/17 0231     Discharge Medications: Please see discharge summary for a list of discharge medications.  Relevant Imaging Results:  Relevant Lab Results:   Additional Information SSN: [id number]  Ihor Gully, LCSW

## 2017-06-30 NOTE — Care Management Important Message (Signed)
Important Message  Patient Details  Name: [name] [name] MRN: [id number] Date of Birth: 1940/05/14   Medicare Important Message Given:  Yes    Nixon Sparr, Chauncey Reading, RN 06/30/2017, 8:01 AM

## 2017-06-30 NOTE — Clinical Social Work Placement (Signed)
   CLINICAL SOCIAL WORK PLACEMENT  NOTE  Date:  06/30/2017  Patient Details  Name: [name] [name] MRN: [id number] Date of Birth: 1940-07-12  Clinical Social Work is seeking post-discharge placement for this patient at the [place] level of care (*CSW will initial, date and re-position this form in  chart as items are completed):  Yes   Patient/family provided with [place] Work Department's list of facilities offering this level of care within the geographic area requested by the patient (or if unable, by the patient's family).  Yes   Patient/family informed of their freedom to choose among providers that offer the needed level of care, that participate in Medicare, Medicaid or managed care program needed by the patient, have an available bed and are willing to accept the patient.  Yes   Patient/family informed of [facility]'s ownership interest in [facility] and [facility], as well as of the fact that they are under no obligation to receive care at these facilities.  PASRR submitted to EDS on       PASRR number received on       Existing PASRR number confirmed on 06/30/17     FL2 transmitted to all facilities in geographic area requested by pt/family on 06/30/17     FL2 transmitted to all facilities within larger geographic area on       Patient informed that his/her managed care company has contracts with or will negotiate with certain facilities, including the following:        Yes   Patient/family informed of bed offers received.  Patient chooses bed at [facility]     Physician recommends and patient chooses bed at      Patient to be transferred to [facility] on 06/30/17.  Patient to be transferred to facility by [facility]     Patient family notified on 06/30/17 of transfer.  Name of family member notified:  Raechel Chute, dtr     PHYSICIAN       Additional Comment:  Facility notified and discharge clinicals sent. LCSW  signing off.   _______________________________________________ Ihor Gully, LCSW 06/30/2017, 1:55 PM

## 2017-06-30 NOTE — Progress Notes (Signed)
Report called and given to [employer], Therapist, sports at [employer]. IV removed, WNL. D/C instructions given to pt. Verbalized understanding. Awaiting EMS to transport patient.

## 2017-06-30 NOTE — Discharge Summary (Signed)
Physician Discharge Summary  [name] [name] LGX:[id number] DOB: [birth year] DOA: 06/21/2017  PCP: [name], MD  Admit date: 06/21/2017 Discharge date: 06/30/2017  Admitted From: Home Disposition:  SNF  Recommendations for Outpatient Follow-up:  1. Follow up with PCP in 1-2 weeks 2. Please obtain BMP/CBC in one week 3. Maintain oxygen 4 L nasal cannula    Discharge Condition: Stable CODE STATUS: FULL Diet recommendation: Heart Healthy    Brief/Interim Summary: 77 year old male with a history of bladder cancer, chronic respiratory failure on 4 L oxygen, hyperlipidemia, depression, hyperlipidemia presenting with shortness of breath. The patient was noted to be in respiratory distress upon arrival. He was placed on BiPAP. He was admitted to the stepdown unit. The patient was started on bronchodilators and intravenous Solu-Medrol. He was also noted to have urinary retention. A Foley catheter was placed in the emergency department on 06/21/2017.Patient had very slow improvement. The patient's Solu-Medrol was increased;Pulmicort and Brovana were added after which the patient continued to improve clinically.  His solumedrol was weaned to po prednisone.  He will be discharge with prednisone taper.  Initially, the patient wanted to go home with home health.  However after discussion with the patient's family, they decided that it was in the patient's best interest to go to a skilled nursing facility for short period of time.  His discharge was delayed secondary to administrative issues.    Discharge Diagnoses:  Acute on chronic respiratory failure with hypoxia -Secondary to COPD exacerbation -Normally on 4 L nasal cannula at home -Presented with respiratory distress requiring BiPAP -Presently stable on5L>>>4L -Weaned oxygen back to baseline level -check Mag--2.0, phos 3.0  COPD exacerbation -Very slow improvement, but continues to improve -Very low functional  reserve -Continued duo nebs during hospitalization -ContinuedPulmicort -ContinuedBrovana -Continue IV Solu-Medrol>>>po prednisone -d/c with prednisone taper -restart Breo and Incruse  Pyuria -Secondary to bladder cancer -urine cultures here and at [facility] unrevealing  Urinary retention/hematuria/high grade urothelial carcinoma -Foley catheter has been placed on 3/9 -I discussed risks, benefits and alternatives of leaving in foley catheter--pt and family understand and want to leave foley in at time of d/c -Will need outpatient follow-up with urology for voiding trial.His urologist is at [facility]. -he was scheduled to start radiation to his bladder on 3/11, this has been delayed due to his current hospitalization. -04/26/17--CYSTOSCOPY W/ FULGURATION / EVACUATION OF CLOTS, TURBT -case discussed with pt's rad onc  (Dr. Quitman Livings) -Hemoglobin has remained stable throughout the hospitalization  Drug induced hyperglycemia -check A1C--6.2 -novolog ISS -due to steroids -added lantus 5 units at hs while he was on intravenous steroids -CBGs improving with weaning steroids  AKI -likely volume depletion with likely degree of obstructive uropathy -resolved with foley  GOC -discussed advance planning with sister at bedside--understands and agrees with DNR      Discharge Instructions  Discharge Instructions    Diet - low sodium heart healthy   Complete by:  As directed    Increase activity slowly   Complete by:  As directed      Allergies as of 06/30/2017      Reactions   Metrizamide Hives   Iohexol Hives    Code: HIVES, Desc: PT STATES HE BROKE OUT IN HIVES AND RASH AFTER CT NECK EARLY SEPT 2011; NO RESP PROBLEMS; NEEDS PRE-MEDS; MKS, Onset Date: [id number]   Ivp Dye [iodinated Diagnostic Agents] Hives      Medication List    STOP taking these medications   HYDROcodone-acetaminophen 5-325 MG tablet Commonly known  as:  NORCO/VICODIN   levofloxacin 500 MG  tablet Commonly known as:  LEVAQUIN   LORazepam 0.5 MG tablet Commonly known as:  ATIVAN   methylPREDNISolone sodium succinate 125 mg/2 mL injection Commonly known as:  SOLU-MEDROL   nitrofurantoin (macrocrystal-monohydrate) 100 MG capsule Commonly known as:  MACROBID   sulfamethoxazole-trimethoprim 800-160 MG tablet Commonly known as:  BACTRIM DS,SEPTRA DS     TAKE these medications   acetaminophen 325 MG tablet Commonly known as:  TYLENOL Take 650 mg by mouth every 6 (six) hours as needed for headache.   albuterol (2.5 MG/3ML) 0.083% nebulizer solution Commonly known as:  PROVENTIL Take 3 mLs (2.5 mg total) by nebulization every 4 (four) hours as needed. For shortness of breath   VENTOLIN HFA 108 (90 Base) MCG/ACT inhaler Generic drug:  albuterol Inhale 2 puffs into the lungs every 6 (six) hours as needed for wheezing or shortness of breath.   ALPRAZolam 1 MG tablet Commonly known as:  XANAX Take 1 tablet (1 mg total) by mouth 3 (three) times daily as needed for anxiety or sleep.   cyanocobalamin 500 MCG tablet Take 500 mcg by mouth every morning.   docusate sodium 100 MG capsule Commonly known as:  COLACE Take 100 mg by mouth daily as needed for mild constipation or moderate constipation.   DULoxetine 60 MG capsule Commonly known as:  CYMBALTA Take 1 capsule (60 mg total) by mouth daily.   fluticasone furoate-vilanterol 200-25 MCG/INH Aepb Commonly known as:  BREO ELLIPTA Inhale 1 puff into the lungs daily.   guaiFENesin-dextromethorphan 100-10 MG/5ML syrup Commonly known as:  ROBITUSSIN DM Take 10 mLs by mouth every 6 (six) hours.   multivitamin with minerals Tabs tablet Take 1 tablet by mouth daily.   NITROSTAT 0.4 MG SL tablet Generic drug:  nitroGLYCERIN Place 1 tablet under the tongue every 5 (five) minutes as needed for chest pain.   omeprazole 20 MG capsule Commonly known as:  PRILOSEC Take 20 mg by mouth daily.   predniSONE 50 MG  tablet Commonly known as:  DELTASONE Take 1 tablet (50 mg total) by mouth 2 (two) times daily with a meal. X 2 days, then 40 mg by mouth twice daily x 2 days, then 30 mg twice daily x 2 days, then 20 mg twice daily x 2 days, then 10 mg twice daily x 2 days   prochlorperazine 10 MG tablet Commonly known as:  COMPAZINE Take 1 hour before each days radiation treatment for nausea   roflumilast 500 MCG Tabs tablet Commonly known as:  DALIRESP Take 1 Tablet by mouth once daily   simvastatin 40 MG tablet Commonly known as:  ZOCOR Take 40 mg by mouth at bedtime.   umeclidinium bromide 62.5 MCG/INH Aepb Commonly known as:  INCRUSE ELLIPTA Inhale 1 puff into the lungs daily.            Durable Medical Equipment  (From admission, onward)        Start     Ordered   06/27/17 1101  For home use only DME Hospital bed  Once    Question Answer Comment  The above medical condition requires: Patient requires the ability to reposition frequently   Head must be elevated greater than: 30 degrees   Bed type Semi-electric      06/27/17 1100      Allergies  Allergen Reactions  . Metrizamide Hives  . Iohexol Hives     Code: HIVES, Desc: PT STATES HE BROKE OUT  IN HIVES AND RASH AFTER CT NECK EARLY SEPT 2011; NO RESP PROBLEMS; NEEDS PRE-MEDS; MKS, Onset Date: [id number]   . Ivp Dye [Iodinated Diagnostic Agents] Hives    Consultations:  none   Procedures/Studies: US Renal  Result Date: 06/22/2017 CLINICAL DATA:  Acute kidney injury. History of bladder tumor resection in 2013. EXAM: RENAL / URINARY TRACT ULTRASOUND COMPLETE COMPARISON:  CT abdomen dated 04/01/2017. FINDINGS: Right Kidney: Length: 10.8 cm. Echogenicity within normal limits. Benign cyst again noted within the midpole region, measuring 1.5 cm. No suspicious mass or hydronephrosis visualized. Left Kidney: Length: 10.6 cm. Echogenicity within normal limits. Small benign cyst again noted within the lower pole, measuring 1 cm. No  suspicious mass or hydronephrosis visualized. Bladder: Bladder decompressed by Foley catheter. IMPRESSION: 1. No acute or significant findings. Both kidneys demonstrate normal cortical thickness and echogenicity. Small bilateral renal cysts, as also described on CT of 04/01/2017. 2. Bladder decompressed by Foley catheter. Electronically Signed   By: Franki Cabot M.D.   On: 06/22/2017 12:14   Dg Chest Port 1 View  Result Date: 06/21/2017 CLINICAL DATA:  SOB, pt on c-pap mask EXAM: PORTABLE CHEST 1 VIEW COMPARISON:  04/23/2017 FINDINGS: Cardiac silhouette is normal in size. No mediastinal or hilar masses. There are prominent bronchovascular markings which are similar to prior exams. There is a possible component of interstitial edema. No evidence of pneumonia. No pleural effusion or pneumothorax. Skeletal structures are grossly intact. IMPRESSION: 1. Prominent bronchovascular markings similar to prior exams, likely chronic. A component of interstitial edema should be considered given the reported shortness of breath. No evidence of pneumonia. Electronically Signed   By: Lajean Manes M.D.   On: 06/21/2017 16:48        Discharge Exam: Vitals:   06/30/17 0751 06/30/17 0758  BP:    Pulse:    Resp:    Temp:    SpO2: 96% 96%   Vitals:   06/30/17 0241 06/30/17 0509 06/30/17 0751 06/30/17 0758  BP:  (!) 141/72    Pulse:  75    Resp:  18    Temp:  97.8 F (36.6 C)    TempSrc:  Oral    SpO2: 95% 95% 96% 96%  Weight:  83.6 kg (184 lb 4.9 oz)    Height:        General: Pt is alert, awake, not in acute distress Cardiovascular: RRR, S1/S2 +, no rubs, no gallops Respiratory: Bibasilar rales without wheezing.  Upper airway wheezing--  Mild Abdominal: Soft, NT, ND, bowel sounds + Extremities: no edema, no cyanosis   The results of significant diagnostics from this hospitalization (including imaging, microbiology, ancillary and laboratory) are listed below for reference.    Significant  Diagnostic Studies: US Renal  Result Date: 06/22/2017 CLINICAL DATA:  Acute kidney injury. History of bladder tumor resection in 2013. EXAM: RENAL / URINARY TRACT ULTRASOUND COMPLETE COMPARISON:  CT abdomen dated 04/01/2017. FINDINGS: Right Kidney: Length: 10.8 cm. Echogenicity within normal limits. Benign cyst again noted within the midpole region, measuring 1.5 cm. No suspicious mass or hydronephrosis visualized. Left Kidney: Length: 10.6 cm. Echogenicity within normal limits. Small benign cyst again noted within the lower pole, measuring 1 cm. No suspicious mass or hydronephrosis visualized. Bladder: Bladder decompressed by Foley catheter. IMPRESSION: 1. No acute or significant findings. Both kidneys demonstrate normal cortical thickness and echogenicity. Small bilateral renal cysts, as also described on CT of 04/01/2017. 2. Bladder decompressed by Foley catheter. Electronically Signed   By: Franki Cabot  M.D.   On: 06/22/2017 12:14   Dg Chest Port 1 View  Result Date: 06/21/2017 CLINICAL DATA:  SOB, pt on c-pap mask EXAM: PORTABLE CHEST 1 VIEW COMPARISON:  04/23/2017 FINDINGS: Cardiac silhouette is normal in size. No mediastinal or hilar masses. There are prominent bronchovascular markings which are similar to prior exams. There is a possible component of interstitial edema. No evidence of pneumonia. No pleural effusion or pneumothorax. Skeletal structures are grossly intact. IMPRESSION: 1. Prominent bronchovascular markings similar to prior exams, likely chronic. A component of interstitial edema should be considered given the reported shortness of breath. No evidence of pneumonia. Electronically Signed   By: Lajean Manes M.D.   On: 06/21/2017 16:48     Microbiology: Recent Results (from the past 240 hour(s))  Culture, Urine     Status: Abnormal   Collection Time: 06/21/17  4:29 PM  Result Value Ref Range Status   Specimen Description   Final    URINE, CLEAN CATCH Performed at [facility], [address], [place], [place] [address]    Special Requests   Final    NONE Performed at [facility], [address] [place], [place] [address]    Culture (A)  Final    <10,000 COLONIES/mL INSIGNIFICANT GROWTH Performed at [facility] [address], [place], [place] [address]    Report Status 06/23/2017 FINAL  Final  MRSA PCR Screening     Status: None   Collection Time: 06/22/17 12:39 PM  Result Value Ref Range Status   MRSA by PCR NEGATIVE NEGATIVE Final    Comment:        The GeneXpert MRSA Assay (FDA approved for NASAL specimens only), is one component of a comprehensive MRSA colonization surveillance program. It is not intended to diagnose MRSA infection nor to guide or monitor treatment for MRSA infections. Performed at [facility], [address] [place], [place] [address]      Labs: Basic Metabolic Panel: Recent Labs  Lab 06/24/17 0554 06/26/17 1011 06/27/17 0500  NA 135 135  --   K 4.6 4.3  --   CL 94* 92*  --   CO2 29 31  --   GLUCOSE 196* 287*  --   BUN 26* 24*  --   CREATININE 0.88 0.92  --   CALCIUM 9.5 9.4  --   MG  --  1.9 2.0  PHOS  --   --  3.0   Liver Function Tests: No results for input(s): AST, ALT, ALKPHOS, BILITOT, PROT, ALBUMIN in the last 168 hours. No results for input(s): LIPASE, AMYLASE in the last 168 hours. No results for input(s): AMMONIA in the last 168 hours. CBC: Recent Labs  Lab 06/24/17 0554 06/26/17 1011 06/27/17 0500 06/28/17 0614  WBC 15.6* 11.9* 13.6* 13.3*  HGB 7.8* 8.5* 8.2* 8.1*  HCT 27.3* 29.3* 28.6* 27.8*  MCV 84.0 83.5 83.6 83.0  PLT 501* 492* 489* 459*   Cardiac Enzymes: No results for input(s): CKTOTAL, CKMB, CKMBINDEX, TROPONINI in the last 168 hours. BNP: Invalid input(s): POCBNP CBG: Recent Labs  Lab 06/29/17 0722 06/29/17 1208 06/29/17 1655 06/29/17 2121 06/30/17 0728  GLUCAP 157* 277* 155* 200* 137*    Time coordinating discharge:  Greater than 30  minutes  Signed:  Orson Eva, DO [facility] Pager: [phone] 06/30/2017, 9:02 AM

## 2017-07-04 ENCOUNTER — Inpatient Hospital Stay (HOSPITAL_COMMUNITY)
Admission: EM | Admit: 2017-07-04 | Discharge: 2017-07-08 | DRG: 871 | Disposition: A | Discharge status: Skilled Nursing Facility | Payer: Medicare Other | Attending: Internal Medicine | Admitting: Internal Medicine

## 2017-07-04 ENCOUNTER — Emergency Department (HOSPITAL_COMMUNITY): Payer: Medicare Other

## 2017-07-04 ENCOUNTER — Other Ambulatory Visit: Payer: Self-pay

## 2017-07-04 ENCOUNTER — Encounter (HOSPITAL_COMMUNITY): Payer: Self-pay | Admitting: Emergency Medicine

## 2017-07-04 DIAGNOSIS — J439 Emphysema, unspecified: Secondary | ICD-10-CM | POA: Diagnosis present

## 2017-07-04 DIAGNOSIS — E785 Hyperlipidemia, unspecified: Secondary | ICD-10-CM | POA: Diagnosis present

## 2017-07-04 DIAGNOSIS — K219 Gastro-esophageal reflux disease without esophagitis: Secondary | ICD-10-CM | POA: Diagnosis present

## 2017-07-04 DIAGNOSIS — E7800 Pure hypercholesterolemia, unspecified: Secondary | ICD-10-CM | POA: Diagnosis present

## 2017-07-04 DIAGNOSIS — Y95 Nosocomial condition: Secondary | ICD-10-CM | POA: Diagnosis present

## 2017-07-04 DIAGNOSIS — J42 Unspecified chronic bronchitis: Secondary | ICD-10-CM

## 2017-07-04 DIAGNOSIS — I10 Essential (primary) hypertension: Secondary | ICD-10-CM | POA: Diagnosis present

## 2017-07-04 DIAGNOSIS — Z825 Family history of asthma and other chronic lower respiratory diseases: Secondary | ICD-10-CM

## 2017-07-04 DIAGNOSIS — Z8551 Personal history of malignant neoplasm of bladder: Secondary | ICD-10-CM | POA: Diagnosis not present

## 2017-07-04 DIAGNOSIS — F419 Anxiety disorder, unspecified: Secondary | ICD-10-CM | POA: Diagnosis present

## 2017-07-04 DIAGNOSIS — J418 Mixed simple and mucopurulent chronic bronchitis: Secondary | ICD-10-CM | POA: Diagnosis not present

## 2017-07-04 DIAGNOSIS — A419 Sepsis, unspecified organism: Secondary | ICD-10-CM | POA: Diagnosis present

## 2017-07-04 DIAGNOSIS — Z66 Do not resuscitate: Secondary | ICD-10-CM | POA: Diagnosis present

## 2017-07-04 DIAGNOSIS — F17210 Nicotine dependence, cigarettes, uncomplicated: Secondary | ICD-10-CM | POA: Diagnosis present

## 2017-07-04 DIAGNOSIS — D494 Neoplasm of unspecified behavior of bladder: Secondary | ICD-10-CM | POA: Diagnosis not present

## 2017-07-04 DIAGNOSIS — N400 Enlarged prostate without lower urinary tract symptoms: Secondary | ICD-10-CM | POA: Diagnosis present

## 2017-07-04 DIAGNOSIS — E876 Hypokalemia: Secondary | ICD-10-CM | POA: Diagnosis present

## 2017-07-04 DIAGNOSIS — D649 Anemia, unspecified: Secondary | ICD-10-CM | POA: Diagnosis present

## 2017-07-04 DIAGNOSIS — E872 Acidosis: Secondary | ICD-10-CM | POA: Diagnosis present

## 2017-07-04 DIAGNOSIS — Z7951 Long term (current) use of inhaled steroids: Secondary | ICD-10-CM | POA: Diagnosis not present

## 2017-07-04 DIAGNOSIS — R339 Retention of urine, unspecified: Secondary | ICD-10-CM | POA: Diagnosis present

## 2017-07-04 DIAGNOSIS — J189 Pneumonia, unspecified organism: Secondary | ICD-10-CM | POA: Diagnosis present

## 2017-07-04 DIAGNOSIS — C679 Malignant neoplasm of bladder, unspecified: Secondary | ICD-10-CM | POA: Diagnosis present

## 2017-07-04 DIAGNOSIS — Z9981 Dependence on supplemental oxygen: Secondary | ICD-10-CM | POA: Diagnosis not present

## 2017-07-04 DIAGNOSIS — J449 Chronic obstructive pulmonary disease, unspecified: Secondary | ICD-10-CM | POA: Diagnosis present

## 2017-07-04 DIAGNOSIS — D72829 Elevated white blood cell count, unspecified: Secondary | ICD-10-CM | POA: Diagnosis not present

## 2017-07-04 DIAGNOSIS — Z91041 Radiographic dye allergy status: Secondary | ICD-10-CM | POA: Diagnosis not present

## 2017-07-04 DIAGNOSIS — Z8711 Personal history of peptic ulcer disease: Secondary | ICD-10-CM

## 2017-07-04 DIAGNOSIS — R739 Hyperglycemia, unspecified: Secondary | ICD-10-CM | POA: Diagnosis present

## 2017-07-04 DIAGNOSIS — J9611 Chronic respiratory failure with hypoxia: Secondary | ICD-10-CM | POA: Diagnosis present

## 2017-07-04 DIAGNOSIS — R0602 Shortness of breath: Secondary | ICD-10-CM | POA: Diagnosis present

## 2017-07-04 LAB — CBC WITH DIFFERENTIAL/PLATELET
BASOS ABS: 0 10*3/uL (ref 0.0–0.1)
Basophils Relative: 0 %
Eosinophils Absolute: 0.4 10*3/uL (ref 0.0–0.7)
Eosinophils Relative: 1 %
HEMATOCRIT: 29.7 % — AB (ref 39.0–52.0)
HEMOGLOBIN: 8.7 g/dL — AB (ref 13.0–17.0)
LYMPHS PCT: 3 %
Lymphs Abs: 1.3 10*3/uL (ref 0.7–4.0)
MCH: 24.2 pg — ABNORMAL LOW (ref 26.0–34.0)
MCHC: 29.3 g/dL — ABNORMAL LOW (ref 30.0–36.0)
MCV: 82.5 fL (ref 78.0–100.0)
MONOS PCT: 7 %
Monocytes Absolute: 3.1 10*3/uL — ABNORMAL HIGH (ref 0.1–1.0)
Neutro Abs: 39.2 10*3/uL — ABNORMAL HIGH (ref 1.7–7.7)
Neutrophils Relative %: 89 %
Platelets: 322 10*3/uL (ref 150–400)
RBC: 3.6 MIL/uL — ABNORMAL LOW (ref 4.22–5.81)
RDW: 17.9 % — AB (ref 11.5–15.5)
WBC: 44 10*3/uL — AB (ref 4.0–10.5)

## 2017-07-04 LAB — URINALYSIS, MICROSCOPIC (REFLEX): Squamous Epithelial / LPF: NONE SEEN

## 2017-07-04 LAB — INFLUENZA PANEL BY PCR (TYPE A & B)
INFLBPCR: NEGATIVE
Influenza A By PCR: NEGATIVE

## 2017-07-04 LAB — COMPREHENSIVE METABOLIC PANEL
ALT: 54 U/L (ref 17–63)
ANION GAP: 13 (ref 5–15)
AST: 28 U/L (ref 15–41)
Albumin: 2.6 g/dL — ABNORMAL LOW (ref 3.5–5.0)
Alkaline Phosphatase: 78 U/L (ref 38–126)
BILIRUBIN TOTAL: 0.7 mg/dL (ref 0.3–1.2)
BUN: 18 mg/dL (ref 6–20)
CO2: 33 mmol/L — ABNORMAL HIGH (ref 22–32)
Calcium: 8 mg/dL — ABNORMAL LOW (ref 8.9–10.3)
Chloride: 92 mmol/L — ABNORMAL LOW (ref 101–111)
Creatinine, Ser: 0.9 mg/dL (ref 0.61–1.24)
GFR calc Af Amer: >60 mL/min (ref >60)
Glucose, Bld: 216 mg/dL — ABNORMAL HIGH (ref 65–99)
POTASSIUM: 3.1 mmol/L — AB (ref 3.5–5.1)
Sodium: 138 mmol/L (ref 135–145)
TOTAL PROTEIN: 6.1 g/dL — AB (ref 6.5–8.1)

## 2017-07-04 LAB — URINALYSIS, ROUTINE W REFLEX MICROSCOPIC
Glucose, UA: 100 mg/dL — AB
NITRITE: POSITIVE — AB
PH: 7 (ref 5.0–8.0)
Protein, ur: 100 mg/dL — AB
SPECIFIC GRAVITY, URINE: 1.02 (ref 1.005–1.030)

## 2017-07-04 LAB — RESPIRATORY PANEL BY PCR
ADENOVIRUS-RVPPCR: NOT DETECTED
Bordetella pertussis: NOT DETECTED
CHLAMYDOPHILA PNEUMONIAE-RVPPCR: NOT DETECTED
CORONAVIRUS 229E-RVPPCR: NOT DETECTED
CORONAVIRUS NL63-RVPPCR: NOT DETECTED
Coronavirus HKU1: NOT DETECTED
Coronavirus OC43: NOT DETECTED
Influenza A: NOT DETECTED
Influenza B: NOT DETECTED
MYCOPLASMA PNEUMONIAE-RVPPCR: NOT DETECTED
Metapneumovirus: NOT DETECTED
Parainfluenza Virus 1: NOT DETECTED
Parainfluenza Virus 2: NOT DETECTED
Parainfluenza Virus 3: NOT DETECTED
Parainfluenza Virus 4: NOT DETECTED
RHINOVIRUS / ENTEROVIRUS - RVPPCR: NOT DETECTED
Respiratory Syncytial Virus: NOT DETECTED

## 2017-07-04 LAB — BRAIN NATRIURETIC PEPTIDE: B NATRIURETIC PEPTIDE 5: 123 pg/mL — AB (ref 0.0–100.0)

## 2017-07-04 LAB — LACTIC ACID, PLASMA
LACTIC ACID, VENOUS: 1.9 mmol/L (ref 0.5–1.9)
Lactic Acid, Venous: 2.7 mmol/L (ref 0.5–1.9)

## 2017-07-04 LAB — MAGNESIUM: Magnesium: 1.8 mg/dL (ref 1.7–2.4)

## 2017-07-04 LAB — STREP PNEUMONIAE URINARY ANTIGEN: Strep Pneumo Urinary Antigen: NEGATIVE

## 2017-07-04 MED ORDER — ACETAMINOPHEN 325 MG PO TABS
650.0000 mg | ORAL_TABLET | Freq: Four times a day (QID) | ORAL | Status: DC | PRN
  Administered 2017-07-04 – 2017-07-08 (×8): 650 mg via ORAL
  Filled 2017-07-04 (×8): qty 2

## 2017-07-04 MED ORDER — POTASSIUM CHLORIDE CRYS ER 20 MEQ PO TBCR
40.0000 meq | EXTENDED_RELEASE_TABLET | Freq: Once | ORAL | Status: AC
  Administered 2017-07-04: 40 meq via ORAL
  Filled 2017-07-04: qty 2

## 2017-07-04 MED ORDER — SODIUM CHLORIDE 0.9 % IV BOLUS (SEPSIS)
1000.0000 mL | Freq: Once | INTRAVENOUS | Status: AC
  Administered 2017-07-04: 1000 mL via INTRAVENOUS

## 2017-07-04 MED ORDER — ACETAMINOPHEN 650 MG RE SUPP: 650.0000 mg | Freq: Four times a day (QID) | RECTAL | Status: DC | PRN

## 2017-07-04 MED ORDER — SODIUM CHLORIDE 0.9 % IV SOLN
1.0000 g | Freq: Three times a day (TID) | INTRAVENOUS | Status: DC
  Filled 2017-07-04 (×10): qty 1

## 2017-07-04 MED ORDER — SODIUM CHLORIDE 0.9 % IV SOLN
2.0000 g | Freq: Once | INTRAVENOUS | Status: AC
  Administered 2017-07-04: 2 g via INTRAVENOUS
  Filled 2017-07-04: qty 2

## 2017-07-04 MED ORDER — ALBUTEROL (5 MG/ML) CONTINUOUS INHALATION SOLN
10.0000 mg/h | INHALATION_SOLUTION | Freq: Once | RESPIRATORY_TRACT | Status: AC
  Administered 2017-07-04: 10 mg/h via RESPIRATORY_TRACT
  Filled 2017-07-04: qty 20

## 2017-07-04 MED ORDER — SENNOSIDES-DOCUSATE SODIUM 8.6-50 MG PO TABS: 1.0000 | ORAL_TABLET | Freq: Every evening | ORAL | Status: DC | PRN

## 2017-07-04 MED ORDER — ACETAMINOPHEN 650 MG RE SUPP
650.0000 mg | Freq: Once | RECTAL | Status: AC
  Administered 2017-07-04: 650 mg via RECTAL
  Filled 2017-07-04: qty 1

## 2017-07-04 MED ORDER — SODIUM CHLORIDE 0.9 % IV SOLN
INTRAVENOUS | Status: DC
  Administered 2017-07-04 – 2017-07-07 (×4): via INTRAVENOUS

## 2017-07-04 MED ORDER — VANCOMYCIN HCL IN DEXTROSE 1-5 GM/200ML-% IV SOLN
1000.0000 mg | Freq: Two times a day (BID) | INTRAVENOUS | Status: DC
  Administered 2017-07-04 – 2017-07-08 (×8): 1000 mg via INTRAVENOUS
  Filled 2017-07-04 (×8): qty 200

## 2017-07-04 MED ORDER — SODIUM CHLORIDE 0.9 % IV SOLN
1.0000 g | Freq: Three times a day (TID) | INTRAVENOUS | Status: DC
  Administered 2017-07-04 – 2017-07-08 (×12): 1 g via INTRAVENOUS
  Filled 2017-07-04 (×16): qty 1

## 2017-07-04 MED ORDER — ONDANSETRON HCL 4 MG/2ML IJ SOLN: 4.0000 mg | Freq: Four times a day (QID) | INTRAMUSCULAR | Status: DC | PRN

## 2017-07-04 MED ORDER — METHYLPREDNISOLONE SODIUM SUCC 125 MG IJ SOLR
125.0000 mg | Freq: Once | INTRAMUSCULAR | Status: AC
  Administered 2017-07-04: 125 mg via INTRAVENOUS
  Filled 2017-07-04: qty 2

## 2017-07-04 MED ORDER — VANCOMYCIN HCL IN DEXTROSE 1-5 GM/200ML-% IV SOLN
1000.0000 mg | Freq: Once | INTRAVENOUS | Status: AC
  Administered 2017-07-04: 1000 mg via INTRAVENOUS
  Filled 2017-07-04: qty 200

## 2017-07-04 MED ORDER — ONDANSETRON HCL 4 MG PO TABS
4.0000 mg | ORAL_TABLET | Freq: Four times a day (QID) | ORAL | Status: DC | PRN
  Filled 2017-07-04: qty 1

## 2017-07-04 MED ORDER — ALBUTEROL SULFATE (2.5 MG/3ML) 0.083% IN NEBU
2.5000 mg | INHALATION_SOLUTION | RESPIRATORY_TRACT | Status: DC | PRN
  Administered 2017-07-06: 5 mg via RESPIRATORY_TRACT
  Administered 2017-07-06: 2.5 mg via RESPIRATORY_TRACT
  Filled 2017-07-04 (×4): qty 3

## 2017-07-04 NOTE — Progress Notes (Signed)
Pharmacy Antibiotic Note  [name] [name] is a 77 y.o. male admitted on 07/04/2017 with pneumonia.  Pharmacy has been consulted for Vancomycin and Cefepime dosing.  Plan:  Vancomycin 1000mg  IV q12h Check trough at steady state Cefepime 1gm  IV q8h Monitor labs, renal fxn, progress and c/s Deescalate ABX when improved / appropriate.    Antimicrobials this admission: Cefepime 3/22 >>  Vancomycin 3/22 >>   Dose adjustments this admission:  Microbiology results: 3/22  BCx: pending  UCx: pending   Sputum: pending   3/22 MRSA PCR: pending  Height: 5\' 9"  (175.3 cm) Weight: 186 lb 8.2 oz (84.6 kg) IBW/kg (Calculated) : 70.7  Temp (24hrs), Avg:100.4 F (38 C), Min:98.4 F (36.9 C), Max:102.8 F (39.3 C)  Recent Labs  Lab 06/28/17 0614 07/04/17 0724 07/04/17 0857  WBC 13.3* 44.0*  --   CREATININE  --  0.90  --   LATICACIDVEN  --  2.7* 1.9    Estimated Creatinine Clearance: 70.9 mL/min (by C-G formula based on SCr of 0.9 mg/dL).    Allergies  Allergen Reactions  . Metrizamide Hives  . Iohexol Hives     Code: HIVES, Desc: PT STATES HE BROKE OUT IN HIVES AND RASH AFTER CT NECK EARLY SEPT 2011; NO RESP PROBLEMS; NEEDS PRE-MEDS; MKS, Onset Date: [id number]   . Ivp Dye [Iodinated Diagnostic Agents] Hives   Thank you for allowing pharmacy to be a part of this patient's care.  Hart Robinsons A 07/04/2017 11:21 AM

## 2017-07-04 NOTE — ED Notes (Signed)
Pt's daughter arrived and is at bedside.  

## 2017-07-04 NOTE — ED Notes (Signed)
Pt's daughter [name] called to check on pt. Daughter states she is Medical POA if any decisions needs to be made for pt to call her.

## 2017-07-04 NOTE — ED Notes (Signed)
Phlebotomy at bedside.

## 2017-07-04 NOTE — ED Provider Notes (Signed)
MSE was initiated and I personally evaluated the patient and placed orders (if any) at  6:35 AM on July 04, 2017.  The patient appears stable so that the remainder of the MSE may be completed by another provider.  Patient presents from his nursing facility.  They report the patient was short of breath and they gave him albuterol inhaler 2 puffs.  He is chronically on oxygen 4 L/min nasal cannula and they report his pulse ox dropped into the 80s.  He also complained of nausea and he was given Ativan and Compazine.  They report he seemed more confused after that.  Patient states he was feeling fine and then "everything changed".  Patient appears to be tachypneic.  He is awake and alert however he is not oriented as to which facility he is currently on.  He is noted to have diffuse wheezing and rhonchi in all lung fields.  He has having retractions and some abdominal breathing.  There is no peripheral edema.  Initial laboratory testing was started.  Portable chest x-ray was ordered.  He was given continuous nebulizer with albuterol and IV Solu-Medrol.  He was started on healthcare associated pneumonia antibiotics presuming that the most likely source of his fever is going to be pulmonary at this point.  That can be changed however.  Code sepsis was called.  Code sepsis protocol was initiated.    EKG Interpretation  Date/Time:  Friday July 04 2017 06:37:06 EDT Ventricular Rate:  130 PR Interval:    QRS Duration: 68 QT Interval:  295 QTC Calculation: 434 R Axis:   121 Text Interpretation:  Sinus tachycardia Right axis deviation LOW VOLTAGE Since last tracing rate faster 21 Jun 2017 Confirmed by Rolland Porter [phone]) on 07/04/2017 6:42:57 AM        Medications  albuterol (PROVENTIL,VENTOLIN) solution continuous neb (has no administration in time range)  methylPREDNISolone sodium succinate (SOLU-MEDROL) 125 mg/2 mL injection 125 mg (has no administration in time range)   07:00 AM Patient turned  over to Dr Winfred Leeds at change of shift.   Rolland Porter, MD, Barbette Or, MD 07/04/17 [phone]

## 2017-07-04 NOTE — ED Triage Notes (Addendum)
Pt had sudden onset of sob walking to restroom with walker. Facility states pt is on continuous o2 at 4lpm. Pt is from [facility] and facility states they gave him ativan, compazine and albuterol 2 puffs. They state his o2 initially was 82 %, but after meds increased to 94%. EMS gave pt albuterol treatment enroute.

## 2017-07-04 NOTE — H&P (Signed)
History and Physical    [name] [name]:[id number] DOB: 1941/04/10 DOA: 07/04/2017  Referring MD/NP/PA: Hoover Brunette, EDP PCP: [name], MD  Patient coming from: SNF  Chief Complaint: Fever, shortness of breath  HPI: [name] [name] is a 77 y.o. male who was recently hospitalized for COPD exacerbation who presents today from nursing facility with fever.  He had a temp of 102.8 upon arrival, patient states he felt as if he had the flu with muscle aches.  He has also had increasing shortness of breath that had improved after his prior to discharge.  In the ED a code sepsis was called due to tachycardia, fever, lactic acidosis and a markedly increased WBC count of 44.  He is hemodynamically stable.  Foley catheter was placed during prior admission due to acute urinary retention with a history of bladder cancer.  UA today is cloudy with positive nitrates, many bacteria and 6-30 WBCs, chest x-ray shows COPD/emphysema with mild asymmetric interstitial edema.  He was given sepsis directed fluid boluses broad-spectrum antibiotics and admission was requested.  Past Medical/Surgical History: Past Medical History:  Diagnosis Date  . Anxiety 03/13/2012  . Asthma   . 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  Marland Kitchen BPH (benign prostatic hyperplasia) 10/01/2011   Per cystoscopy  . Chronic anxiety   . Chronic back pain   . COPD (chronic obstructive pulmonary disease) ([employer])   . Depression   . Emphysema lung ([place])   . GERD (gastroesophageal reflux disease)   . Headache(784.0)   . History of stomach ulcers   . Hypercholesteremia   . Hyperlipidemia 03/13/2012  . On home O2    2L N/C continuous  . Pulmonary nodule 09/2011   Stable appearance  . Tobacco abuse 09/29/2011    Past Surgical History:  Procedure Laterality Date  . CARDIAC CATHETERIZATION  2003   [facility]  . CHOLECYSTECTOMY    . CYSTOSCOPY  09/30/2011   Procedure: CYSTOSCOPY FLEXIBLE;  Surgeon: Marissa Nestle, MD;   Location: [facility];  Service: Urology;  Laterality: N/A;  . FRACTURE SURGERY     R ring finger, pin placed  . TRANSURETHRAL RESECTION OF BLADDER TUMOR  10/01/2011   Procedure: TRANSURETHRAL RESECTION OF BLADDER TUMOR (TURBT);  Surgeon: Marissa Nestle, MD;  Location: [facility];  Service: Urology;  Laterality: N/A;    Social History:  reports that he has been smoking cigarettes.  He has a 29.00 pack-year smoking history. He has never used smokeless tobacco. He reports that he does not drink alcohol or use drugs.  Allergies: Allergies  Allergen Reactions  . Metrizamide Hives  . Iohexol Hives     Code: HIVES, Desc: PT STATES HE BROKE OUT IN HIVES AND RASH AFTER CT NECK EARLY SEPT 2011; NO RESP PROBLEMS; NEEDS PRE-MEDS; MKS, Onset Date: [id number]   . Ivp Dye [Iodinated Diagnostic Agents] Hives    Family History:  Family History  Problem Relation Age of Onset  . Kidney failure Mother   . Emphysema Brother        was not close to family; does not truly know their medical problems.  . Emphysema Brother   . Emphysema Brother   . Cancer Neg Hx     Prior to Admission medications   Medication Sig Start Date End Date Taking? Authorizing Provider  acetaminophen (TYLENOL) 325 MG tablet Take 650 mg by mouth every 6 (six) hours as needed for headache.   Yes [provider]  albuterol (PROVENTIL) (2.5  MG/3ML) 0.083% nebulizer solution Take 3 mLs (2.5 mg total) by nebulization every 4 (four) hours as needed. For shortness of breath 11/28/15  Yes Timmothy Euler, MD  ALPRAZolam Duanne Moron) 1 MG tablet Take 1 tablet (1 mg total) by mouth 3 (three) times daily as needed for anxiety or sleep. 06/29/17  Yes Tat, Shanon Brow, MD  atorvastatin (LIPITOR) 20 MG tablet Take 20 mg by mouth daily.   Yes [provider]  citalopram (CELEXA) 20 MG tablet Take 20 mg by mouth daily.   Yes [provider]  cyanocobalamin 500 MCG tablet Take 1,000 mcg by mouth daily.    Yes [provider]    docusate sodium (COLACE) 100 MG capsule Take 100 mg by mouth daily as needed for mild constipation or moderate constipation.    Yes [provider]  fluticasone (FLONASE) 50 MCG/ACT nasal spray Place 1 spray into both nostrils daily.   Yes [provider]  iron polysaccharides (NIFEREX) 150 MG capsule Take 150 mg by mouth 2 (two) times daily.   Yes [provider]  loratadine (CLARITIN) 10 MG tablet Take 10 mg by mouth daily.   Yes [provider]  mirtazapine (REMERON) 15 MG tablet Take 15 mg by mouth at bedtime.   Yes [provider]  montelukast (SINGULAIR) 10 MG tablet Take 10 mg by mouth at bedtime.   Yes [provider]  Multiple Vitamin (MULTIVITAMIN WITH MINERALS) TABS tablet Take 1 tablet by mouth daily.   Yes [provider]  omeprazole (PRILOSEC) 20 MG capsule Take 20 mg by mouth daily. 11/11/16  Yes [provider]  oxybutynin (DITROPAN) 5 MG tablet Take 5 mg by mouth 3 (three) times daily.   Yes [provider]  oxyCODONE (OXY IR/ROXICODONE) 5 MG immediate release tablet Take 1 tablet by mouth 2 (two) times daily.   Yes [provider]  predniSONE (DELTASONE) 50 MG tablet Take 1 tablet (50 mg total) by mouth 2 (two) times daily with a meal. X 2 days, then 40 mg by mouth twice daily x 2 days, then 30 mg twice daily x 2 days, then 20 mg twice daily x 2 days, then 10 mg twice daily x 2 days 06/29/17  Yes Tat, Shanon Brow, MD  roflumilast (DALIRESP) 500 MCG TABS tablet Take 1 Tablet by mouth once daily 11/11/16  Yes [provider]  umeclidinium bromide (INCRUSE ELLIPTA) 62.5 MCG/INH AEPB Inhale 1 puff into the lungs daily. 01/01/16  Yes Hawks, Christy A, FNP  VENTOLIN HFA 108 (90 Base) MCG/ACT inhaler Inhale 2 puffs into the lungs every 6 (six) hours as needed for wheezing or shortness of breath. 12/14/15  Yes Timmothy Euler, MD  DULoxetine (CYMBALTA) 60 MG capsule Take 1 capsule (60 mg total) by  mouth daily. 04/24/17   Shahmehdi, Valeria Batman, MD  fluticasone furoate-vilanterol (BREO ELLIPTA) 200-25 MCG/INH AEPB Inhale 1 puff into the lungs daily. 04/24/17   Shahmehdi, Valeria Batman, MD  guaiFENesin-dextromethorphan (ROBITUSSIN DM) 100-10 MG/5ML syrup Take 10 mLs by mouth every 6 (six) hours. 04/23/17   Shahmehdi, Valeria Batman, MD  NITROSTAT 0.4 MG SL tablet Place 1 tablet under the tongue every 5 (five) minutes as needed for chest pain.  02/22/13   [provider]    Review of Systems:  Constitutional: Positive for fever, chills, diaphoresis, appetite change and fatigue.  HEENT: Denies photophobia, eye pain, redness, hearing loss, ear pain, congestion, sore throat, rhinorrhea, sneezing, mouth sores, trouble swallowing, neck pain, neck stiffness  and tinnitus.   Respiratory: Denies SOB, DOE, cough, chest tightness,  and wheezing.   Cardiovascular: Denies chest pain, palpitations and leg swelling.  Gastrointestinal: Denies nausea, vomiting, abdominal pain, diarrhea, constipation, blood in stool and abdominal distention.  Genitourinary: Denies dysuria, urgency, frequency, hematuria, flank pain and difficulty urinating.  Endocrine: Denies: hot or cold intolerance, sweats, changes in hair or nails, polyuria, polydipsia. Musculoskeletal: Denies myalgias, back pain, joint swelling, arthralgias and gait problem.  Skin: Denies pallor, rash and wound.  Neurological: Denies dizziness, seizures, syncope, weakness, light-headedness, numbness and headaches.  Hematological: Denies adenopathy. Easy bruising, personal or family bleeding history  Psychiatric/Behavioral: Denies suicidal ideation, mood changes, confusion, nervousness, sleep disturbance and agitation    Physical Exam: Vitals:   07/04/17 1028 07/04/17 1036 07/04/17 1100 07/04/17 1317  BP:  108/66 118/72   Pulse:  88 (!) 104   Resp:  (!) 27 (!) 33   Temp:  98.4 F (36.9 C)  98.2 F (36.8 C)  TempSrc:  Oral  Axillary  SpO2:  98% 94%   Weight:  84.6 kg (186 lb 8.2 oz)     Height: 5\' 9"  (1.753 m)        Constitutional: NAD, calm, comfortable Eyes: PERRL, lids and conjunctivae normal ENMT: Mucous membranes are dry. Posterior pharynx clear of any exudate or lesions.Normal dentition.  Neck: normal, supple, no masses, no thyromegaly Respiratory: clear to auscultation bilaterally, no wheezing, no crackles. Normal respiratory effort. No accessory muscle use.  Cardiovascular: Regular rate and rhythm, no murmurs / rubs / gallops. No extremity edema. 2+ pedal pulses. No carotid bruits.  Abdomen: no tenderness, no masses palpated. No hepatosplenomegaly. Bowel sounds positive.  Musculoskeletal: no clubbing / cyanosis. No joint deformity upper and lower extremities. Good ROM, no contractures. Normal muscle tone.  Skin: no rashes, lesions, ulcers. No induration Neurologic: CN 2-12 grossly intact. Sensation intact, DTR normal. Strength 5/5 in all 4.  Psychiatric: Normal judgment and insight. Alert and oriented x 3. Normal mood.    Labs on Admission: I have personally reviewed the following labs and imaging studies  CBC: Recent Labs  Lab 06/28/17 0614 07/04/17 0724  WBC 13.3* 44.0*  NEUTROABS  --  39.2*  HGB 8.1* 8.7*  HCT 27.8* 29.7*  MCV 83.0 82.5  PLT 459* 409   Basic Metabolic Panel: Recent Labs  Lab 07/04/17 0724  NA 138  K 3.1*  CL 92*  CO2 33*  GLUCOSE 216*  BUN 18  CREATININE 0.90  CALCIUM 8.0*   GFR: Estimated Creatinine Clearance: 70.9 mL/min (by C-G formula based on SCr of 0.9 mg/dL). Liver Function Tests: Recent Labs  Lab 07/04/17 0724  AST 28  ALT 54  ALKPHOS 78  BILITOT 0.7  PROT 6.1*  ALBUMIN 2.6*   No results for input(s): LIPASE, AMYLASE in the last 168 hours. No results for input(s): AMMONIA in the last 168 hours. Coagulation Profile: No results for input(s): INR, PROTIME in the last 168 hours. Cardiac Enzymes: No results for input(s): CKTOTAL, CKMB, CKMBINDEX, TROPONINI in the last 168  hours. BNP (last 3 results) No results for input(s): PROBNP in the last 8760 hours. HbA1C: No results for input(s): HGBA1C in the last 72 hours. CBG: Recent Labs  Lab 06/29/17 1208 06/29/17 1655 06/29/17 2121 06/30/17 0728 06/30/17 1131  GLUCAP 277* 155* 200* 137* 231*   Lipid Profile: No results for input(s): CHOL, HDL, LDLCALC, TRIG, CHOLHDL, LDLDIRECT in the last 72 hours. Thyroid Function Tests: No results for input(s): TSH, T4TOTAL, FREET4,  T3FREE, THYROIDAB in the last 72 hours. Anemia Panel: No results for input(s): VITAMINB12, FOLATE, FERRITIN, TIBC, IRON, RETICCTPCT in the last 72 hours. Urine analysis:    Component Value Date/Time   COLORURINE RED (A) 07/04/2017 0815   APPEARANCEUR CLOUDY (A) 07/04/2017 0815   APPEARANCEUR Turbid (A) 01/25/2016 1407   LABSPEC 1.020 07/04/2017 0815   PHURINE 7.0 07/04/2017 0815   GLUCOSEU 100 (A) 07/04/2017 0815   HGBUR LARGE (A) 07/04/2017 0815   BILIRUBINUR MODERATE (A) 07/04/2017 0815   BILIRUBINUR Negative 01/25/2016 1407   KETONESUR TRACE (A) 07/04/2017 0815   PROTEINUR 100 (A) 07/04/2017 0815   UROBILINOGEN 0.2 05/04/2014 0956   NITRITE POSITIVE (A) 07/04/2017 0815   LEUKOCYTESUR MODERATE (A) 07/04/2017 0815   LEUKOCYTESUR 3+ (A) 01/25/2016 1407   Sepsis Labs: @LABRCNTIP (procalcitonin:4,lacticidven:4) ) Recent Results (from the past 240 hour(s))  Blood Culture (routine x 2)     Status: None (Preliminary result)   Collection Time: 07/04/17  7:16 AM  Result Value Ref Range Status   Specimen Description BLOOD RIGHT HAND  Final   Special Requests   Final    BOTTLES DRAWN AEROBIC ONLY Blood Culture results may not be optimal due to an inadequate volume of blood received in culture bottles Performed at [facility], [address], [place], [place] [address]    Culture PENDING  Incomplete   Report Status PENDING  Incomplete  Blood Culture (routine x 2)     Status: None (Preliminary result)   Collection Time: 07/04/17   7:25 AM  Result Value Ref Range Status   Specimen Description BLOOD RIGHT ARM  Final   Special Requests   Final    BOTTLES DRAWN AEROBIC AND ANAEROBIC Blood Culture adequate volume Performed at [facility], [address], [place], [place] [address]    Culture PENDING  Incomplete   Report Status PENDING  Incomplete     Radiological Exams on Admission: Dg Chest Port 1 View  Result Date: 07/04/2017 CLINICAL DATA:  Sudden onset shortness of breath, COPD, emphysema, smoker EXAM: PORTABLE CHEST 1 VIEW COMPARISON:  06/21/2017 FINDINGS: Background COPD/emphysema noted. Mild superimposed and asymmetric diffuse interstitial opacities suggesting a component of early edema. Minor basilar atelectasis. No large effusion or pneumothorax. Trachea is midline. Normal heart size. Trachea is midline. No osseous abnormality. 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 Electronically Signed   By: Jerilynn Mages.  Shick M.D.   On: 07/04/2017 07:22    EKG: Independently reviewed.  Sinus tachycardia at a rate of 130, no acute ischemic changes  Assessment/Plan Active Problems:   Essential hypertension   Bladder tumor   Hyperlipidemia   COPD (chronic obstructive pulmonary disease) ([facility])   Chronic respiratory failure with hypoxia (H[facility])   Sepsis (HCC)   Hypokalemia   Leukocytosis    Sepsis -Etiology is not entirely clear.  Although he does have a dirty urine he has a chronic Foley catheter;  For now, I believe we need to treat for a catheter associated UTI .Also chest x-ray is not completely indicative of infection, however his symptoms would point more towards a respiratory source and given recent admission and the fact that he resides at a skilled nursing facility believe we need to cover for hospital-acquired pneumonia -Nonetheless given the severity of his presentation will place on broad-spectrum antibiotics consisting of vancomycin and cefepime mainly to cover  hospital-acquired pneumonia but cefepime should also cover possibility of UTI. -Blood, sputum, urine cultures have been requested. -Strep pneumo and Legionella urine antigen  requested. -MRSA PCR requested and can discontinue vancomycin if negative. -Will also check influenza PCR.  Bladder tumor -Was slated to start radiation therapy today, this will obviously need to be delayed. -Patient's daughter is insisting on is removing catheter as she believes this is a source for infection.  This was placed during his prior hospitalization due to acute urinary retention. -Have discussed with RN that we will remove catheter and perform post void residuals to determine whether of the catheter needs to be replaced or whether he can be left out.  Chronic respiratory failure -Due to COPD -This does not appear to be exacerbated at present. -Continue oxygen supplementation.  Hypokalemia -We will replace, check magnesium level.  Leukocytosis -Marked with a WBC count of 44. -Likely due to sepsis, monitor with antibiotics.     DVT prophylaxis: SCDs Code Status: DNR Family Communication: Daughter at bedside updated on plan of care and questions answered Disposition Plan:  back to SNF when ready Consults called: None Admission status: Inpatient   Time Spent: 85 minutes  Estela Isaac Bliss MD [facility] Pager [phone]  If 7PM-7AM, please contact night-coverage [email or web address] Password [facility]  07/04/2017, 3:04 PM

## 2017-07-04 NOTE — ED Notes (Signed)
Lab states there is not enough urine to to do a UA. Will recollect.

## 2017-07-04 NOTE — Progress Notes (Signed)
Palliative Medicine consult order noted. PMT provider will return to [facility] on Monday 3/25. If the patient remains hospitalized, she will be seen at that time. If recommendations are needed in the interim, please call our office at [phone].   Marjie Skiff Maie Kesinger, RN, BSN, [facility] Palliative Medicine Team 07/04/2017 9:09 AM Office [phone]

## 2017-07-04 NOTE — ED Notes (Signed)
CRITICAL VALUE ALERT  Critical Value:  Lactic Acid 2.4  Date & Time Notied:  07/04/17 @ 0812  Provider Notified: Dr Toya Smothers, RN  Orders Received/Actions taken: see new orders

## 2017-07-04 NOTE — ED Notes (Signed)
Pt given water with EDP approval. 

## 2017-07-04 NOTE — ED Provider Notes (Addendum)
[facility] EMERGENCY DEPARTMENT Provider Note   CSN: [id number] Arrival date & time: 07/04/17  1884 Level 5 caveat acuity situation    History   Chief Complaint Chief Complaint  Patient presents with  . Shortness of Breath    HPI [name] [name] is a 77 y.o. male.  Complains of shortness of breath onset 2 days ago.  He denies any cough he admits to vomiting one time.  He reportedly received Ativan and Compazine prior to coming here.  Patient denies pain anywhere.  HPI  Past Medical History:  Diagnosis Date  . Anxiety 03/13/2012  . Asthma   . 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  Marland Kitchen BPH (benign prostatic hyperplasia) 10/01/2011   Per cystoscopy  . Chronic anxiety   . Chronic back pain   . COPD (chronic obstructive pulmonary disease) ([place])   . Depression   . Emphysema lung ([place])   . GERD (gastroesophageal reflux disease)   . Headache(784.0)   . History of stomach ulcers   . Hypercholesteremia   . Hyperlipidemia 03/13/2012  . On home O2    2L N/C continuous  . Pulmonary nodule 09/2011   Stable appearance  . Tobacco abuse 09/29/2011    Patient Active Problem List   Diagnosis Date Noted  . Urothelial carcinoma ([place]) 06/25/2017  . AKI (acute kidney injury) ([place])   . Severe anemia 04/22/2017  . Shortness of breath   . Acute on chronic respiratory failure ([place]) 10/24/2015  . Pressure ulcer 09/25/2015  . Respiratory failure ([place]) 09/24/2015  . Acute on chronic respiratory failure with hypercapnia ([place])   . Frequent falls 09/21/2015  . HLD (hyperlipidemia) 08/18/2015  . Chronic anemia 08/18/2015  . COPD exacerbation ([place]) 08/18/2015  . Chronic pain syndrome 05/01/2015  . Chronic respiratory failure with hypoxia ([place])   . Acute on chronic respiratory failure with hypoxia ([place]) 02/25/2014  . COPD (chronic obstructive pulmonary disease) ([place]) 02/09/2014  . Hyperglycemia, drug-induced 03/26/2013  . Chronic back pain   . Anxiety 03/13/2012  .  Hyperlipidemia 03/13/2012  . BPH (benign prostatic hyperplasia) 10/01/2011  . Tobacco abuse 09/29/2011  . Bladder tumor 09/28/2011  . PULMONARY NODULE 12/27/2009  . Essential hypertension 12/26/2009  . OSTEOARTHRITIS 12/26/2009    Past Surgical History:  Procedure Laterality Date  . CARDIAC CATHETERIZATION  2003   [facility]  . CHOLECYSTECTOMY    . CYSTOSCOPY  09/30/2011   Procedure: CYSTOSCOPY FLEXIBLE;  Surgeon: Marissa Nestle, MD;  Location: [facility];  Service: Urology;  Laterality: N/A;  . FRACTURE SURGERY     R ring finger, pin placed  . TRANSURETHRAL RESECTION OF BLADDER TUMOR  10/01/2011   Procedure: TRANSURETHRAL RESECTION OF BLADDER TUMOR (TURBT);  Surgeon: Marissa Nestle, MD;  Location: [facility];  Service: Urology;  Laterality: N/A;       Home Medications    Prior to Admission medications   Medication Sig Start Date End Date Taking? Authorizing Provider  acetaminophen (TYLENOL) 325 MG tablet Take 650 mg by mouth every 6 (six) hours as needed for headache.    [provider]  albuterol (PROVENTIL) (2.5 MG/3ML) 0.083% nebulizer solution Take 3 mLs (2.5 mg total) by nebulization every 4 (four) hours as needed. For shortness of breath 11/28/15   Timmothy Euler, MD  ALPRAZolam Duanne Moron) 1 MG tablet Take 1 tablet (1 mg total) by mouth 3 (three) times daily as needed for anxiety or sleep. 06/29/17   Orson Eva, MD  cyanocobalamin 500 MCG  tablet Take 500 mcg by mouth every morning.    [provider]  docusate sodium (COLACE) 100 MG capsule Take 100 mg by mouth daily as needed for mild constipation or moderate constipation.     [provider]  DULoxetine (CYMBALTA) 60 MG capsule Take 1 capsule (60 mg total) by mouth daily. 04/24/17   Shahmehdi, Valeria Batman, MD  fluticasone furoate-vilanterol (BREO ELLIPTA) 200-25 MCG/INH AEPB Inhale 1 puff into the lungs daily. 04/24/17   Shahmehdi, Valeria Batman, MD  guaiFENesin-dextromethorphan (ROBITUSSIN DM) 100-10 MG/5ML syrup  Take 10 mLs by mouth every 6 (six) hours. 04/23/17   ShahmehdiValeria Batman, MD  Multiple Vitamin (MULTIVITAMIN WITH MINERALS) TABS tablet Take 1 tablet by mouth daily.    [provider]  NITROSTAT 0.4 MG SL tablet Place 1 tablet under the tongue every 5 (five) minutes as needed for chest pain.  02/22/13   [provider]  omeprazole (PRILOSEC) 20 MG capsule Take 20 mg by mouth daily. 11/11/16   [provider]  predniSONE (DELTASONE) 50 MG tablet Take 1 tablet (50 mg total) by mouth 2 (two) times daily with a meal. X 2 days, then 40 mg by mouth twice daily x 2 days, then 30 mg twice daily x 2 days, then 20 mg twice daily x 2 days, then 10 mg twice daily x 2 days 06/29/17   Tat, Shanon Brow, MD  prochlorperazine (COMPAZINE) 10 MG tablet Take 1 hour before each days radiation treatment for nausea 06/18/17   [provider]  roflumilast (DALIRESP) 500 MCG TABS tablet Take 1 Tablet by mouth once daily 11/11/16   [provider]  simvastatin (ZOCOR) 40 MG tablet Take 40 mg by mouth at bedtime.     [provider]  umeclidinium bromide (INCRUSE ELLIPTA) 62.5 MCG/INH AEPB Inhale 1 puff into the lungs daily. 01/01/16   Hawks, Theador Hawthorne, FNP  VENTOLIN HFA 108 (90 Base) MCG/ACT inhaler Inhale 2 puffs into the lungs every 6 (six) hours as needed for wheezing or shortness of breath. 12/14/15   Timmothy Euler, MD    Family History Family History  Problem Relation Age of Onset  . Kidney failure Mother   . Emphysema Brother        was not close to family; does not truly know their medical problems.  . Emphysema Brother   . Emphysema Brother   . Cancer Neg Hx     Social History Social History   Tobacco Use  . Smoking status: Current Every Day Smoker    Packs/day: 0.50    Years: 58.00    Pack years: 29.00    Types: Cigarettes  . Smokeless tobacco: Never Used  Substance Use Topics  . Alcohol use: No  . Drug use: No     Allergies   Metrizamide; Iohexol;  and Ivp dye [iodinated diagnostic agents]   Review of Systems Review of Systems  Unable to perform ROS: Acuity of condition  Respiratory: Positive for shortness of breath.   Genitourinary:       Chronic indwelling Foley  Allergic/Immunologic: Positive for immunocompromised state.       Cancer patient     Physical Exam Updated Vital Signs BP (!) 143/88   Pulse (!) 125   Temp (!) 102.8 F (39.3 C) (Rectal)   Resp (!) 21   Ht 5\' 9"  (1.753 m)   Wt 83.5 kg (184 lb)   SpO2 98%   BMI 27.17 kg/m   Physical Exam  Constitutional:  He is oriented to person, place, and time. He appears distressed.  Acutely and chronically ill-appearing  HENT:  Head: Normocephalic and atraumatic.  Eyes: Pupils are equal, round, and reactive to light. Conjunctivae are normal.  Neck: Neck supple. No tracheal deviation present. No thyromegaly present.  Cardiovascular: Regular rhythm.  No murmur heard. Tachycardic  Pulmonary/Chest:  Diffuse rhonchi  Abdominal: Soft. Bowel sounds are normal. He exhibits no distension. There is no tenderness.  Genitourinary:  Genitourinary Comments: Foley in place with grossly bloody urine and back  Musculoskeletal: Normal range of motion. He exhibits no edema or tenderness.  Neurological: He is alert and oriented to person, place, and time. Coordination normal.  Skin: Skin is warm and dry. No rash noted.  Psychiatric: He has a normal mood and affect.  Nursing note and vitals reviewed.    ED Treatments / Results  Labs (all labs ordered are listed, but only abnormal results are displayed) Labs Reviewed  CULTURE, BLOOD (ROUTINE X 2)  CULTURE, BLOOD (ROUTINE X 2)  URINE CULTURE  RESPIRATORY PANEL BY PCR  COMPREHENSIVE METABOLIC PANEL  CBC WITH DIFFERENTIAL/PLATELET  URINALYSIS, ROUTINE W REFLEX MICROSCOPIC  LACTIC ACID, PLASMA  LACTIC ACID, PLASMA  BRAIN NATRIURETIC PEPTIDE    EKG  EKG Interpretation  Date/Time:  Friday July 04 2017 06:37:06  EDT Ventricular Rate:  130 PR Interval:    QRS Duration: 68 QT Interval:  295 QTC Calculation: 434 R Axis:   121 Text Interpretation:  Sinus tachycardia Right axis deviation LOW VOLTAGE Since last tracing rate faster 21 Jun 2017 Confirmed by Rolland Porter [id number]) on 07/04/2017 6:42:57 AM      Results for orders placed or performed during the hospital encounter of 07/04/17  Blood Culture (routine x 2)  Result Value Ref Range   Specimen Description BLOOD RIGHT HAND    Special Requests      BOTTLES DRAWN AEROBIC ONLY Blood Culture results may not be optimal due to an inadequate volume of blood received in culture bottles Performed at [facility], [address], [place], [place] [address]    Culture PENDING    Report Status PENDING   Blood Culture (routine x 2)  Result Value Ref Range   Specimen Description BLOOD RIGHT ARM    Special Requests      BOTTLES DRAWN AEROBIC AND ANAEROBIC Blood Culture adequate volume Performed at [facility], [address], [place], [place] [address]    Culture PENDING    Report Status PENDING   Comprehensive metabolic panel  Result Value Ref Range   Sodium 138 135 - 145 mmol/L   Potassium 3.1 (L) 3.5 - 5.1 mmol/L   Chloride 92 (L) 101 - 111 mmol/L   CO2 33 (H) 22 - 32 mmol/L   Glucose, Bld 216 (H) 65 - 99 mg/dL   BUN 18 6 - 20 mg/dL   Creatinine, Ser 0.90 0.61 - 1.24 mg/dL   Calcium 8.0 (L) 8.9 - 10.3 mg/dL   Total Protein 6.1 (L) 6.5 - 8.1 g/dL   Albumin 2.6 (L) 3.5 - 5.0 g/dL   AST 28 15 - 41 U/L   ALT 54 17 - 63 U/L   Alkaline Phosphatase 78 38 - 126 U/L   Total Bilirubin 0.7 0.3 - 1.2 mg/dL   GFR calc non Af Amer >60 >60 mL/min   GFR calc Af Amer >60 >60 mL/min   Anion gap 13 5 - 15  CBC WITH DIFFERENTIAL  Result Value Ref Range   WBC 44.0 (  H) 4.0 - 10.5 K/uL   RBC 3.60 (L) 4.22 - 5.81 MIL/uL   Hemoglobin 8.7 (L) 13.0 - 17.0 g/dL   HCT 29.7 (L) 39.0 - 52.0 %   MCV 82.5 78.0 - 100.0 fL   MCH 24.2 (L) 26.0 - 34.0 pg   MCHC 29.3 (L)  30.0 - 36.0 g/dL   RDW 17.9 (H) 11.5 - 15.5 %   Platelets 322 150 - 400 K/uL   Neutrophils Relative % 89 %   Lymphocytes Relative 3 %   Monocytes Relative 7 %   Eosinophils Relative 1 %   Basophils Relative 0 %   Neutro Abs 39.2 (H) 1.7 - 7.7 K/uL   Lymphs Abs 1.3 0.7 - 4.0 K/uL   Monocytes Absolute 3.1 (H) 0.1 - 1.0 K/uL   Eosinophils Absolute 0.4 0.0 - 0.7 K/uL   Basophils Absolute 0.0 0.0 - 0.1 K/uL   WBC Morphology MILD LEFT SHIFT (1-5% METAS, OCC MYELO, OCC BANDS)   Lactic acid, plasma  Result Value Ref Range   Lactic Acid, Venous 2.7 (HH) 0.5 - 1.9 mmol/L  Brain natriuretic peptide  Result Value Ref Range   B Natriuretic Peptide 123.0 (H) 0.0 - 100.0 pg/mL   US Renal  Result Date: 06/22/2017 CLINICAL DATA:  Acute kidney injury. History of bladder tumor resection in 2013. EXAM: RENAL / URINARY TRACT ULTRASOUND COMPLETE COMPARISON:  CT abdomen dated 04/01/2017. FINDINGS: Right Kidney: Length: 10.8 cm. Echogenicity within normal limits. Benign cyst again noted within the midpole region, measuring 1.5 cm. No suspicious mass or hydronephrosis visualized. Left Kidney: Length: 10.6 cm. Echogenicity within normal limits. Small benign cyst again noted within the lower pole, measuring 1 cm. No suspicious mass or hydronephrosis visualized. Bladder: Bladder decompressed by Foley catheter. IMPRESSION: 1. No acute or significant findings. Both kidneys demonstrate normal cortical thickness and echogenicity. Small bilateral renal cysts, as also described on CT of 04/01/2017. 2. Bladder decompressed by Foley catheter. Electronically Signed   By: Franki Cabot M.D.   On: 06/22/2017 12:14   Dg Chest Port 1 View  Result Date: 07/04/2017 CLINICAL DATA:  Sudden onset shortness of breath, COPD, emphysema, smoker EXAM: PORTABLE CHEST 1 VIEW COMPARISON:  06/21/2017 FINDINGS: Background COPD/emphysema noted. Mild superimposed and asymmetric diffuse interstitial opacities suggesting a component of early edema.  Minor basilar atelectasis. No large effusion or pneumothorax. Trachea is midline. Normal heart size. Trachea is midline. No osseous abnormality. 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 Electronically Signed   By: Jerilynn Mages.  Shick M.D.   On: 07/04/2017 07:22   Dg Chest Port 1 View  Result Date: 06/21/2017 CLINICAL DATA:  SOB, pt on c-pap mask EXAM: PORTABLE CHEST 1 VIEW COMPARISON:  04/23/2017 FINDINGS: Cardiac silhouette is normal in size. No mediastinal or hilar masses. There are prominent bronchovascular markings which are similar to prior exams. There is a possible component of interstitial edema. No evidence of pneumonia. No pleural effusion or pneumothorax. Skeletal structures are grossly intact. IMPRESSION: 1. Prominent bronchovascular markings similar to prior exams, likely chronic. A component of interstitial edema should be considered given the reported shortness of breath. No evidence of pneumonia. Electronically Signed   By: Lajean Manes M.D.   On: 06/21/2017 16:48   Radiology No results found.  Procedures Procedures (including critical care time)  Medications Ordered in ED Medications  sodium chloride 0.9 % bolus 1,000 mL (1,000 mLs Intravenous New Bag/Given 07/04/17 0705)    And  sodium chloride 0.9 % bolus 1,000 mL (  has no administration in time range)    And  sodium chloride 0.9 % bolus 1,000 mL (1,000 mLs Intravenous New Bag/Given 07/04/17 0658)  ceFEPIme (MAXIPIME) 2 g in sodium chloride 0.9 % 100 mL IVPB (has no administration in time range)  vancomycin (VANCOCIN) IVPB 1000 mg/200 mL premix (has no administration in time range)  albuterol (PROVENTIL,VENTOLIN) solution continuous neb (10 mg/hr Nebulization Given 07/04/17 0653)  methylPREDNISolone sodium succinate (SOLU-MEDROL) 125 mg/2 mL injection 125 mg (125 mg Intravenous Given 07/04/17 0658)  acetaminophen (TYLENOL) suppository 650 mg (650 mg Rectal Given 07/04/17 6812)     Initial  Impression / Assessment and Plan / ED Course  I have reviewed the triage vital signs and the nursing notes.  Pertinent labs & imaging results that were available during my care of the patient were reviewed by me and considered in my medical decision making (see chart for details).     Patient advised me that he wants to be a DO NOT RESUSCITATE CODE STATUS Code sepsis called by Dr. Tomi Bamberger prior to my arrival.  Sirs criteria include fever and tachycardia and tachypnea.  Source of infection likely respiratory  Chest x-ray viewed by me 7:50 AM feels improved after treatment with Sepsis - Repeat Assessment  Performed at:    750 AM  Vitals     Blood pressure (!) 145/76, pulse (!) 128, temperature (!) 102.8 F (39.3 C), temperature source Rectal, resp. rate (!) 37, height 5\' 9"  (1.753 m), weight 83.5 kg (184 lb), SpO2 98 %.  Heart:     Regular rate and rhythm and Tachycardic  Lungs:    Rhonchi  Capillary Refill:   <2 sec  Peripheral Pulse:   Radial pulse palpable  Skin:     Normal Color  In light of shortness of breath, fever, leukocytosis rhonchi, near normal BNP chest x-ray findings more consistent with pneumonia and sepsis than with CHF.  Dr. Jerilee Hoh consulted and will arrange for admission.  Lab work also consistent with hyperglycemia and hypokalemia patient anemic, hemoglobin unchanged from 6 days ago  oral potassium supplementation ordered. Final Clinical Impressions(s) / ED Diagnoses  Diagnoses #1 healthcare associated pneumonia #2 severe sepsis #3 hyperglycemia #4 hypokalemia #5 anemia Final diagnoses:  None  CRITICAL CARE Performed by: Orlie Dakin Total critical care time: 40 minutes Critical care time was exclusive of separately billable procedures and treating other patients. Critical care was necessary to treat or prevent imminent or life-threatening deterioration. Critical care was time spent personally by me on the following activities: development of treatment  plan with patient and/or surrogate as well as nursing, discussions with consultants, evaluation of patient's response to treatment, examination of patient, obtaining history from patient or surrogate, ordering and performing treatments and interventions, ordering and review of laboratory studies, ordering and review of radiographic studies, pulse oximetry and re-evaluation of patient's condition.  ED Discharge Orders    None       Orlie Dakin, MD 07/04/17 [place], [place], MD 07/04/17 [phone]

## 2017-07-04 NOTE — Progress Notes (Signed)
Removed foley per order. Catheter was a Coude catheter but the foley bag was not from [facility] had been broken with different drainage bag that we at this hospital use. Urine had blood clots in tubing and blood was also around meatus.

## 2017-07-04 NOTE — ED Notes (Signed)
EDP and phlebotomy at bedside.

## 2017-07-05 LAB — BASIC METABOLIC PANEL
ANION GAP: 7 (ref 5–15)
BUN: 18 mg/dL (ref 6–20)
CALCIUM: 8 mg/dL — AB (ref 8.9–10.3)
CO2: 31 mmol/L (ref 22–32)
Chloride: 103 mmol/L (ref 101–111)
Creatinine, Ser: 0.78 mg/dL (ref 0.61–1.24)
Glucose, Bld: 187 mg/dL — ABNORMAL HIGH (ref 65–99)
Potassium: 4.3 mmol/L (ref 3.5–5.1)
Sodium: 141 mmol/L (ref 135–145)

## 2017-07-05 LAB — CBC
HCT: 24.8 % — ABNORMAL LOW (ref 39.0–52.0)
HEMOGLOBIN: 7.5 g/dL — AB (ref 13.0–17.0)
MCH: 25.3 pg — ABNORMAL LOW (ref 26.0–34.0)
MCHC: 30.2 g/dL (ref 30.0–36.0)
MCV: 83.5 fL (ref 78.0–100.0)
Platelets: 280 10*3/uL (ref 150–400)
RBC: 2.97 MIL/uL — AB (ref 4.22–5.81)
RDW: 18.5 % — ABNORMAL HIGH (ref 11.5–15.5)
WBC: 24.6 10*3/uL — ABNORMAL HIGH (ref 4.0–10.5)

## 2017-07-05 LAB — URINE CULTURE

## 2017-07-05 LAB — MRSA PCR SCREENING: MRSA BY PCR: POSITIVE — AB

## 2017-07-05 LAB — HIV ANTIBODY (ROUTINE TESTING W REFLEX): HIV SCREEN 4TH GENERATION: NONREACTIVE

## 2017-07-05 MED ORDER — MUPIROCIN 2 % EX OINT
1.0000 "application " | TOPICAL_OINTMENT | Freq: Two times a day (BID) | CUTANEOUS | Status: DC
  Administered 2017-07-06 – 2017-07-08 (×6): 1 via NASAL
  Filled 2017-07-05 (×4): qty 22

## 2017-07-05 MED ORDER — CHLORHEXIDINE GLUCONATE CLOTH 2 % EX PADS
6.0000 | MEDICATED_PAD | Freq: Every day | CUTANEOUS | Status: DC
  Administered 2017-07-07 – 2017-07-08 (×2): 6 via TOPICAL

## 2017-07-05 MED ORDER — ALPRAZOLAM 1 MG PO TABS
1.0000 mg | ORAL_TABLET | Freq: Three times a day (TID) | ORAL | Status: DC | PRN
  Administered 2017-07-06 – 2017-07-08 (×5): 1 mg via ORAL
  Filled 2017-07-05 (×4): qty 1
  Filled 2017-07-05: qty 2

## 2017-07-05 MED ORDER — OXYCODONE HCL 5 MG PO TABS
5.0000 mg | ORAL_TABLET | Freq: Four times a day (QID) | ORAL | Status: DC | PRN
  Administered 2017-07-06 (×2): 5 mg via ORAL
  Filled 2017-07-05 (×2): qty 1

## 2017-07-05 NOTE — Progress Notes (Signed)
PROGRESS NOTE    [name] [name]  MCN:[id number] DOB: 1940/09/18 DOA: 07/04/2017 PCP: [name], MD     Brief Narrative:  77 year old man admitted from SNF on 3/22 due to fever and shortness of breath.  He was febrile to 102.8 upon arrival, felt like he had the flu with muscle aches.  Code sepsis was called in the ED due to tachycardia, fever, leukocytosis and lactic acidosis.  UA was also indicative of a UTI and admission was requested.   Assessment & Plan:   Active Problems:   Essential hypertension   Bladder tumor   Hyperlipidemia   COPD (chronic obstructive pulmonary disease) (HCC)   Chronic respiratory failure with hypoxia (HCC)   Sepsis (HCC)   Hypokalemia   Leukocytosis   Sepsis -Sepsis parameters are improved, blood pressure and heart rate are much better, leukocytosis has decreased to 24.6 from 44 on admission.  At this point we are treating for both hospital-acquired pneumonia and a catheter associated UTI pending results of culture data. -Since MRSA PCR is positive we will continue treatment with vancomycin and cefepime.  Bladder tumor -With urinary retention. -Foley catheter was placed during prior admission due to urinary retention. -Patient and patient's daughter would like catheter removed.  Catheter was discontinued on 3/22 patient has been voiding regularly without issues.  Plan to keep catheter out for now.  Chronic respiratory failure -Due to COPD that is not exacerbated at present. -Continue oxygen supplementation.  Hypokalemia -Adequately replaced, is 4.3 today, mag is normal at 1.8.   DVT prophylaxis: SCDs Code Status: DNR Family Communication: Patient only Disposition Plan: Back to SNF once medically stable  Consultants:   None  Procedures:   None  Antimicrobials:  Anti-infectives (From admission, onward)   Start     Dose/Rate Route Frequency Ordered Stop   07/04/17 1800  vancomycin (VANCOCIN) IVPB 1000 mg/200 mL premix     1,000  mg 200 mL/hr over 60 Minutes Intravenous Every 12 hours 07/04/17 1121     07/04/17 1400  ceFEPIme (MAXIPIME) 1 g in sodium chloride 0.9 % 100 mL IVPB  Status:  Discontinued     1 g 200 mL/hr over 30 Minutes Intravenous Every 8 hours 07/04/17 1121 07/04/17 1152   07/04/17 1400  ceFEPIme (MAXIPIME) 1 g in sodium chloride 0.9 % 100 mL IVPB     1 g 200 mL/hr over 30 Minutes Intravenous Every 8 hours 07/04/17 1141 07/12/17 1359   07/04/17 0700  ceFEPIme (MAXIPIME) 2 g in sodium chloride 0.9 % 100 mL IVPB     2 g 200 mL/hr over 30 Minutes Intravenous  Once 07/04/17 0647 07/04/17 0807   07/04/17 0700  vancomycin (VANCOCIN) IVPB 1000 mg/200 mL premix     1,000 mg 200 mL/hr over 60 Minutes Intravenous  Once 07/04/17 0647 07/04/17 0911       Subjective: Lying in bed, denies chest pain, states shortness of breath is significantly improved  Objective: Vitals:   07/05/17 1200 07/05/17 1300 07/05/17 1400 07/05/17 1500  BP: 122/77 139/67 126/75 123/80  Pulse: 69 94 74 71  Resp: (!) 23 (!) 21 (!) 23 (!) 21  Temp:  98.6 F (37 C)    TempSrc:  Oral    SpO2: 94% 96% 96% 96%  Weight:      Height:        Intake/Output Summary (Last 24 hours) at 07/05/2017 1717 Last data filed at 07/05/2017 1510 Gross per 24 hour  Intake 975 ml  Output 375  ml  Net 600 ml   Filed Weights   07/04/17 0637 07/04/17 1028 07/05/17 0500  Weight: 83.5 kg (184 lb) 84.6 kg (186 lb 8.2 oz) 82.4 kg (181 lb 10.5 oz)    Examination:  25 minutesGeneral exam: Alert, awake, oriented x 3 Respiratory system: Clear to auscultation. Respiratory effort normal. Cardiovascular system:RRR. No murmurs, rubs, gallops. Gastrointestinal system: Abdomen is nondistended, soft and nontender. No organomegaly or masses felt. Normal bowel sounds heard. Central nervous system: Alert and oriented. No focal neurological deficits. Extremities: No C/C/E, +pedal pulses Skin: No rashes, lesions or ulcers Psychiatry: Judgement and insight  appear normal. Mood & affect appropriate.     Data Reviewed: I have personally reviewed following labs and imaging studies  CBC: Recent Labs  Lab 07/04/17 0724 07/05/17 0446  WBC 44.0* 24.6*  NEUTROABS 39.2*  --   HGB 8.7* 7.5*  HCT 29.7* 24.8*  MCV 82.5 83.5  PLT 322 696   Basic Metabolic Panel: Recent Labs  Lab 07/04/17 0724 07/04/17 1535 07/05/17 0446  NA 138  --  141  K 3.1*  --  4.3  CL 92*  --  103  CO2 33*  --  31  GLUCOSE 216*  --  187*  BUN 18  --  18  CREATININE 0.90  --  0.78  CALCIUM 8.0*  --  8.0*  MG  --  1.8  --    GFR: Estimated Creatinine Clearance: 79.8 mL/min (by C-G formula based on SCr of 0.78 mg/dL). Liver Function Tests: Recent Labs  Lab 07/04/17 0724  AST 28  ALT 54  ALKPHOS 78  BILITOT 0.7  PROT 6.1*  ALBUMIN 2.6*   No results for input(s): LIPASE, AMYLASE in the last 168 hours. No results for input(s): AMMONIA in the last 168 hours. Coagulation Profile: No results for input(s): INR, PROTIME in the last 168 hours. Cardiac Enzymes: No results for input(s): CKTOTAL, CKMB, CKMBINDEX, TROPONINI in the last 168 hours. BNP (last 3 results) No results for input(s): PROBNP in the last 8760 hours. HbA1C: No results for input(s): HGBA1C in the last 72 hours. CBG: Recent Labs  Lab 06/29/17 1208 06/29/17 1655 06/29/17 2121 06/30/17 0728 06/30/17 1131  GLUCAP 277* 155* 200* 137* 231*   Lipid Profile: No results for input(s): CHOL, HDL, LDLCALC, TRIG, CHOLHDL, LDLDIRECT in the last 72 hours. Thyroid Function Tests: No results for input(s): TSH, T4TOTAL, FREET4, T3FREE, THYROIDAB in the last 72 hours. Anemia Panel: No results for input(s): VITAMINB12, FOLATE, FERRITIN, TIBC, IRON, RETICCTPCT in the last 72 hours. Urine analysis:    Component Value Date/Time   COLORURINE RED (A) 07/04/2017 0815   APPEARANCEUR CLOUDY (A) 07/04/2017 0815   APPEARANCEUR Turbid (A) 01/25/2016 1407   LABSPEC 1.020 07/04/2017 0815   PHURINE 7.0  07/04/2017 0815   GLUCOSEU 100 (A) 07/04/2017 0815   HGBUR LARGE (A) 07/04/2017 0815   BILIRUBINUR MODERATE (A) 07/04/2017 0815   BILIRUBINUR Negative 01/25/2016 1407   KETONESUR TRACE (A) 07/04/2017 0815   PROTEINUR 100 (A) 07/04/2017 0815   UROBILINOGEN 0.2 05/04/2014 0956   NITRITE POSITIVE (A) 07/04/2017 0815   LEUKOCYTESUR MODERATE (A) 07/04/2017 0815   LEUKOCYTESUR 3+ (A) 01/25/2016 1407   Sepsis Labs: @LABRCNTIP (procalcitonin:4,lacticidven:4)  ) Recent Results (from the past 240 hour(s))  Urine culture     Status: Abnormal   Collection Time: 07/04/17  6:41 AM  Result Value Ref Range Status   Specimen Description   Final    URINE, CLEAN CATCH Performed at [employer] [facility], [address] [place], [place] [address]    Special Requests   Final    NONE Performed at [facility], [address], [place], [place] [address]    Culture MULTIPLE SPECIES PRESENT, SUGGEST RECOLLECTION (A)  Final   Report Status 07/05/2017 FINAL  Final  Respiratory Panel by PCR     Status: None   Collection Time: 07/04/17  7:11 AM  Result Value Ref Range Status   Adenovirus NOT DETECTED NOT DETECTED Final   Coronavirus 229E NOT DETECTED NOT DETECTED Final   Coronavirus HKU1 NOT DETECTED NOT DETECTED Final   Coronavirus NL63 NOT DETECTED NOT DETECTED Final   Coronavirus OC43 NOT DETECTED NOT DETECTED Final   Metapneumovirus NOT DETECTED NOT DETECTED Final   Rhinovirus / Enterovirus NOT DETECTED NOT DETECTED Final   Influenza A NOT DETECTED NOT DETECTED Final   Influenza B NOT DETECTED NOT DETECTED Final   Parainfluenza Virus 1 NOT DETECTED NOT DETECTED Final   Parainfluenza Virus 2 NOT DETECTED NOT DETECTED Final   Parainfluenza Virus 3 NOT DETECTED NOT DETECTED Final   Parainfluenza Virus 4 NOT DETECTED NOT DETECTED Final   Respiratory Syncytial Virus NOT DETECTED NOT DETECTED Final   Bordetella pertussis NOT DETECTED NOT DETECTED Final   Chlamydophila pneumoniae NOT DETECTED NOT DETECTED Final     Mycoplasma pneumoniae NOT DETECTED NOT DETECTED Final    Comment: Performed at [facility], [place] [address], [place], [place] [address]  Blood Culture (routine x 2)     Status: None (Preliminary result)   Collection Time: 07/04/17  7:16 AM  Result Value Ref Range Status   Specimen Description BLOOD RIGHT HAND  Final   Special Requests   Final    BOTTLES DRAWN AEROBIC ONLY Blood Culture results may not be optimal due to an inadequate volume of blood received in culture bottles   Culture   Final    NO GROWTH 1 DAY Performed at [facility], [address], [place], [place] [address]    Report Status PENDING  Incomplete  Blood Culture (routine x 2)     Status: None (Preliminary result)   Collection Time: 07/04/17  7:25 AM  Result Value Ref Range Status   Specimen Description BLOOD RIGHT ARM  Final   Special Requests   Final    BOTTLES DRAWN AEROBIC AND ANAEROBIC Blood Culture adequate volume   Culture   Final    NO GROWTH 1 DAY Performed at [facility], [address] [place], [place] [address]    Report Status PENDING  Incomplete  MRSA PCR Screening     Status: Abnormal   Collection Time: 07/05/17 10:00 AM  Result Value Ref Range Status   MRSA by PCR POSITIVE (A) NEGATIVE Final    Comment:        The GeneXpert MRSA Assay (FDA approved for NASAL specimens only), is one component of a comprehensive MRSA colonization surveillance program. It is not intended to diagnose MRSA infection nor to guide or monitor treatment for MRSA infections. RESULT CALLED TO, READ BACK BY AND VERIFIED WITH: KOGER,L @1625  BY MATTEHWS, B 3.23.19 Performed at [facility], [address] [place], [place] [address]          Radiology Studies: Dg Chest [facility] 1 View  Result Date: 07/04/2017 CLINICAL DATA:  Sudden onset shortness of breath, COPD, emphysema, smoker EXAM: PORTABLE CHEST 1 VIEW COMPARISON:  06/21/2017 FINDINGS: Background COPD/emphysema noted. Mild superimposed and asymmetric  diffuse interstitial opacities suggesting a  component of early edema. Minor basilar atelectasis. No large effusion or pneumothorax. Trachea is midline. Normal heart size. Trachea is midline. No osseous abnormality. 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 Electronically Signed   By: Jerilynn Mages.  Shick M.D.   On: 07/04/2017 07:22        Scheduled Meds: . [START ON 07/06/2017] Chlorhexidine Gluconate Cloth  6 each Topical Q0600  . mupirocin ointment  1 application Nasal BID   Continuous Infusions: . sodium chloride 75 mL/hr at 07/04/17 1200  . ceFEPime (MAXIPIME) IV Stopped (07/05/17 1510)  . vancomycin Stopped (07/05/17 0846)     LOS: 1 day    Time spent: 25 minutes. Greater than 50% of this time was spent in direct contact with the patient coordinating care.     Lelon Frohlich, MD [facility] Pager [phone]  If 7PM-7AM, please contact night-coverage [email or web address] Password [facility] 07/05/2017, 5:17 PM

## 2017-07-06 LAB — EXPECTORATED SPUTUM ASSESSMENT W GRAM STAIN, RFLX TO RESP C

## 2017-07-06 LAB — EXPECTORATED SPUTUM ASSESSMENT W REFEX TO RESP CULTURE

## 2017-07-06 LAB — CBC
HEMATOCRIT: 27.3 % — AB (ref 39.0–52.0)
Hemoglobin: 7.9 g/dL — ABNORMAL LOW (ref 13.0–17.0)
MCH: 24.2 pg — ABNORMAL LOW (ref 26.0–34.0)
MCHC: 28.9 g/dL — ABNORMAL LOW (ref 30.0–36.0)
MCV: 83.7 fL (ref 78.0–100.0)
PLATELETS: 254 10*3/uL (ref 150–400)
RBC: 3.26 MIL/uL — AB (ref 4.22–5.81)
RDW: 18.2 % — ABNORMAL HIGH (ref 11.5–15.5)
WBC: 30.8 10*3/uL — AB (ref 4.0–10.5)

## 2017-07-06 MED ORDER — ALBUTEROL SULFATE (2.5 MG/3ML) 0.083% IN NEBU: 2.5000 mg | INHALATION_SOLUTION | RESPIRATORY_TRACT | Status: DC | PRN

## 2017-07-06 MED ORDER — IPRATROPIUM BROMIDE 0.02 % IN SOLN
RESPIRATORY_TRACT | Status: AC
  Administered 2017-07-06: 0.5 mg
  Filled 2017-07-06: qty 2.5

## 2017-07-06 MED ORDER — IPRATROPIUM-ALBUTEROL 0.5-2.5 (3) MG/3ML IN SOLN
3.0000 mL | Freq: Four times a day (QID) | RESPIRATORY_TRACT | Status: DC
  Administered 2017-07-07 – 2017-07-08 (×6): 3 mL via RESPIRATORY_TRACT
  Filled 2017-07-06 (×7): qty 3

## 2017-07-06 NOTE — Progress Notes (Signed)
PROGRESS NOTE    [name] [name]  TKZ:[id number] DOB: 1940-05-02 DOA: 07/04/2017 PCP: [name], MD     Brief Narrative:  77 year old man admitted from SNF on 3/22 due to fever and shortness of breath.  He was febrile to 102.8 upon arrival, felt like he had the flu with muscle aches.  Code sepsis was called in the ED due to tachycardia, fever, leukocytosis and lactic acidosis.  UA was also indicative of a UTI and admission was requested.   Assessment & Plan:   Active Problems:   Essential hypertension   Bladder tumor   Hyperlipidemia   COPD (chronic obstructive pulmonary disease) (HCC)   Chronic respiratory failure with hypoxia (HCC)   Sepsis (HCC)   Hypokalemia   Leukocytosis   Sepsis -Sepsis parameters are improved, blood pressure and heart rate are much better, leukocytosis hasd decreased to 24.6 from 44 on admission, but today back up to 30.Marland Kitchen  At this point we are treating for both hospital-acquired pneumonia and a catheter associated UTI pending results of culture data. -Urine cx with multiple bacterial morphotypes. -Since MRSA PCR is positive we will continue treatment with vancomycin and cefepime. -Wonder if bladder tumor is playing a role in his marked leukocytosis??  Bladder tumor -With urinary retention. -Foley catheter was placed during prior admission due to urinary retention. -Patient and patient's daughter wanted catheter removed.  Catheter was discontinued on 3/22 Patient has been voiding regularly without issues.  Plan to keep catheter out for now.  Chronic respiratory failure -Due to COPD that is not exacerbated at present. -Continue oxygen supplementation.  Hypokalemia -Adequately replaced, Mg 1.8.   DVT prophylaxis: SCDs Code Status: DNR Family Communication: Patient only Disposition Plan: Back to SNF once medically stable  Consultants:   None  Procedures:   None  Antimicrobials:  Anti-infectives (From admission, onward)   Start      Dose/Rate Route Frequency Ordered Stop   07/04/17 1800  vancomycin (VANCOCIN) IVPB 1000 mg/200 mL premix     1,000 mg 200 mL/hr over 60 Minutes Intravenous Every 12 hours 07/04/17 1121     07/04/17 1400  ceFEPIme (MAXIPIME) 1 g in sodium chloride 0.9 % 100 mL IVPB  Status:  Discontinued     1 g 200 mL/hr over 30 Minutes Intravenous Every 8 hours 07/04/17 1121 07/04/17 1152   07/04/17 1400  ceFEPIme (MAXIPIME) 1 g in sodium chloride 0.9 % 100 mL IVPB     1 g 200 mL/hr over 30 Minutes Intravenous Every 8 hours 07/04/17 1141 07/12/17 1359   07/04/17 0700  ceFEPIme (MAXIPIME) 2 g in sodium chloride 0.9 % 100 mL IVPB     2 g 200 mL/hr over 30 Minutes Intravenous  Once 07/04/17 0647 07/04/17 0807   07/04/17 0700  vancomycin (VANCOCIN) IVPB 1000 mg/200 mL premix     1,000 mg 200 mL/hr over 60 Minutes Intravenous  Once 07/04/17 5732 07/04/17 0911       Subjective: Has no complaints, states he has been incontinent, wants to know why is not getting his xanax regularly.  Objective: Vitals:   07/06/17 0800 07/06/17 1000 07/06/17 1100 07/06/17 1200  BP:  129/89 130/79 (!) 188/122  Pulse:  92 89 (!) 113  Resp:  20 (!) 27 (!) 27  Temp: 98 F (36.7 C)     TempSrc: Oral     SpO2:  96% 97% 96%  Weight:      Height:        Intake/Output Summary (Last  24 hours) at 07/06/2017 1538 Last data filed at 07/06/2017 1004 Gross per 24 hour  Intake 1055 ml  Output 1075 ml  Net -20 ml   Filed Weights   07/04/17 1028 07/05/17 0500 07/06/17 0500  Weight: 84.6 kg (186 lb 8.2 oz) 82.4 kg (181 lb 10.5 oz) 81.3 kg (179 lb 3.7 oz)    Examination:  General exam: Alert, awake, oriented x 3 Respiratory system: Clear to auscultation. Respiratory effort normal. Cardiovascular system:RRR. No murmurs, rubs, gallops. Gastrointestinal system: Abdomen is nondistended, soft and nontender. No organomegaly or masses felt. Normal bowel sounds heard. Central nervous system: Alert and oriented. No focal neurological  deficits. Extremities: No C/C/E, +pedal pulses Skin: No rashes, lesions or ulcers Psychiatry: Judgement and insight appear normal. Mood & affect appropriate.      Data Reviewed: I have personally reviewed following labs and imaging studies  CBC: Recent Labs  Lab 07/04/17 0724 07/05/17 0446 07/06/17 0940  WBC 44.0* 24.6* 30.8*  NEUTROABS 39.2*  --   --   HGB 8.7* 7.5* 7.9*  HCT 29.7* 24.8* 27.3*  MCV 82.5 83.5 83.7  PLT 322 280 144   Basic Metabolic Panel: Recent Labs  Lab 07/04/17 0724 07/04/17 1535 07/05/17 0446  NA 138  --  141  K 3.1*  --  4.3  CL 92*  --  103  CO2 33*  --  31  GLUCOSE 216*  --  187*  BUN 18  --  18  CREATININE 0.90  --  0.78  CALCIUM 8.0*  --  8.0*  MG  --  1.8  --    GFR: Estimated Creatinine Clearance: 79.8 mL/min (by C-G formula based on SCr of 0.78 mg/dL). Liver Function Tests: Recent Labs  Lab 07/04/17 0724  AST 28  ALT 54  ALKPHOS 78  BILITOT 0.7  PROT 6.1*  ALBUMIN 2.6*   No results for input(s): LIPASE, AMYLASE in the last 168 hours. No results for input(s): AMMONIA in the last 168 hours. Coagulation Profile: No results for input(s): INR, PROTIME in the last 168 hours. Cardiac Enzymes: No results for input(s): CKTOTAL, CKMB, CKMBINDEX, TROPONINI in the last 168 hours. BNP (last 3 results) No results for input(s): PROBNP in the last 8760 hours. HbA1C: No results for input(s): HGBA1C in the last 72 hours. CBG: Recent Labs  Lab 06/29/17 1655 06/29/17 2121 06/30/17 0728 06/30/17 1131  GLUCAP 155* 200* 137* 231*   Lipid Profile: No results for input(s): CHOL, HDL, LDLCALC, TRIG, CHOLHDL, LDLDIRECT in the last 72 hours. Thyroid Function Tests: No results for input(s): TSH, T4TOTAL, FREET4, T3FREE, THYROIDAB in the last 72 hours. Anemia Panel: No results for input(s): VITAMINB12, FOLATE, FERRITIN, TIBC, IRON, RETICCTPCT in the last 72 hours. Urine analysis:    Component Value Date/Time   COLORURINE RED (A)  07/04/2017 0815   APPEARANCEUR CLOUDY (A) 07/04/2017 0815   APPEARANCEUR Turbid (A) 01/25/2016 1407   LABSPEC 1.020 07/04/2017 0815   PHURINE 7.0 07/04/2017 0815   GLUCOSEU 100 (A) 07/04/2017 0815   HGBUR LARGE (A) 07/04/2017 0815   BILIRUBINUR MODERATE (A) 07/04/2017 0815   BILIRUBINUR Negative 01/25/2016 1407   KETONESUR TRACE (A) 07/04/2017 0815   PROTEINUR 100 (A) 07/04/2017 0815   UROBILINOGEN 0.2 05/04/2014 0956   NITRITE POSITIVE (A) 07/04/2017 0815   LEUKOCYTESUR MODERATE (A) 07/04/2017 0815   LEUKOCYTESUR 3+ (A) 01/25/2016 1407   Sepsis Labs: @LABRCNTIP (procalcitonin:4,lacticidven:4)  ) Recent Results (from the past 240 hour(s))  Urine culture     Status:  Abnormal   Collection Time: 07/04/17  6:41 AM  Result Value Ref Range Status   Specimen Description   Final    URINE, CLEAN CATCH Performed at [facility], [address] [place], [place] [address]    Special Requests   Final    NONE Performed at [facility], [address] [place], [place] [address]    Culture MULTIPLE SPECIES PRESENT, SUGGEST RECOLLECTION (A)  Final   Report Status 07/05/2017 FINAL  Final  Respiratory Panel by PCR     Status: None   Collection Time: 07/04/17  7:11 AM  Result Value Ref Range Status   Adenovirus NOT DETECTED NOT DETECTED Final   Coronavirus 229E NOT DETECTED NOT DETECTED Final   Coronavirus HKU1 NOT DETECTED NOT DETECTED Final   Coronavirus NL63 NOT DETECTED NOT DETECTED Final   Coronavirus OC43 NOT DETECTED NOT DETECTED Final   Metapneumovirus NOT DETECTED NOT DETECTED Final   Rhinovirus / Enterovirus NOT DETECTED NOT DETECTED Final   Influenza A NOT DETECTED NOT DETECTED Final   Influenza B NOT DETECTED NOT DETECTED Final   Parainfluenza Virus 1 NOT DETECTED NOT DETECTED Final   Parainfluenza Virus 2 NOT DETECTED NOT DETECTED Final   Parainfluenza Virus 3 NOT DETECTED NOT DETECTED Final   Parainfluenza Virus 4 NOT DETECTED NOT DETECTED Final   Respiratory Syncytial  Virus NOT DETECTED NOT DETECTED Final   Bordetella pertussis NOT DETECTED NOT DETECTED Final   Chlamydophila pneumoniae NOT DETECTED NOT DETECTED Final   Mycoplasma pneumoniae NOT DETECTED NOT DETECTED Final    Comment: Performed at [facility], [place] [address], [place], [place] [address]  Blood Culture (routine x 2)     Status: None (Preliminary result)   Collection Time: 07/04/17  7:16 AM  Result Value Ref Range Status   Specimen Description BLOOD RIGHT HAND  Final   Special Requests   Final    BOTTLES DRAWN AEROBIC ONLY Blood Culture results may not be optimal due to an inadequate volume of blood received in culture bottles   Culture   Final    NO GROWTH 2 DAYS Performed at [facility], [address] [place], [place] [address]    Report Status PENDING  Incomplete  Blood Culture (routine x 2)     Status: None (Preliminary result)   Collection Time: 07/04/17  7:25 AM  Result Value Ref Range Status   Specimen Description BLOOD RIGHT ARM  Final   Special Requests   Final    BOTTLES DRAWN AEROBIC AND ANAEROBIC Blood Culture adequate volume   Culture   Final    NO GROWTH 2 DAYS Performed at [facility], [address], [place], [place] [address]    Report Status PENDING  Incomplete  MRSA PCR Screening     Status: Abnormal   Collection Time: 07/05/17 10:00 AM  Result Value Ref Range Status   MRSA by PCR POSITIVE (A) NEGATIVE Final    Comment:        The GeneXpert MRSA Assay (FDA approved for NASAL specimens only), is one component of a comprehensive MRSA colonization surveillance program. It is not intended to diagnose MRSA infection nor to guide or monitor treatment for MRSA infections. RESULT CALLED TO, READ BACK BY AND VERIFIED WITH: KOGER,L @1625  BY MATTEHWS, B 3.23.19 Performed at [facility], [address] [place], [place] [address]          Radiology Studies: No results found.      Scheduled Meds: . Chlorhexidine  Gluconate Cloth  6 each Topical  [id number]  . mupirocin ointment  1 application Nasal BID   Continuous Infusions: . sodium chloride 75 mL/hr at 07/06/17 1432  . ceFEPime (MAXIPIME) IV 1 g (07/06/17 1430)  . vancomycin 1,000 mg (07/06/17 0555)     LOS: 2 days    Time spent: 25 minutes. Greater than 50% of this time was spent in direct contact with the patient coordinating care.     Lelon Frohlich, MD [facility] Pager [phone]  If 7PM-7AM, please contact night-coverage [email or web address] Password [facility] 07/06/2017, 3:38 PM

## 2017-07-06 NOTE — Progress Notes (Signed)
Patient is actively wheezing with rhonchus cough, he is on 6 lpm high flow cannula . Cough is non productiive for most part have started flutter. Urine is blood tinged. Cannot see much improvement if any???

## 2017-07-07 LAB — CBC
HEMATOCRIT: 28.1 % — AB (ref 39.0–52.0)
HEMOGLOBIN: 8.2 g/dL — AB (ref 13.0–17.0)
MCH: 24.1 pg — ABNORMAL LOW (ref 26.0–34.0)
MCHC: 29.2 g/dL — AB (ref 30.0–36.0)
MCV: 82.6 fL (ref 78.0–100.0)
Platelets: 237 10*3/uL (ref 150–400)
RBC: 3.4 MIL/uL — ABNORMAL LOW (ref 4.22–5.81)
RDW: 18.5 % — ABNORMAL HIGH (ref 11.5–15.5)
WBC: 21.7 10*3/uL — AB (ref 4.0–10.5)

## 2017-07-07 LAB — LEGIONELLA PNEUMOPHILA SEROGP 1 UR AG: L. pneumophila Serogp 1 Ur Ag: NEGATIVE

## 2017-07-07 NOTE — Progress Notes (Signed)
PROGRESS NOTE    [name] [name]  WEX:[id number] DOB: 1940-06-07 DOA: 07/04/2017 PCP: [name], MD     Brief Narrative:  77 year old man admitted from SNF on 3/22 due to fever and shortness of breath.  He was febrile to 102.8 upon arrival, felt like he had the flu with muscle aches.  Code sepsis was called in the ED due to tachycardia, fever, leukocytosis and lactic acidosis.  UA was also indicative of a UTI and admission was requested.   Assessment & Plan:   Active Problems:   Essential hypertension   Bladder tumor   Hyperlipidemia   COPD (chronic obstructive pulmonary disease) (HCC)   Chronic respiratory failure with hypoxia (HCC)   Sepsis (HCC)   Hypokalemia   Leukocytosis   Sepsis -Sepsis parameters are improved, blood pressure and heart rate are much better, leukocytosis hasd decreased to 21.7 from 44 on admission.  At this point we are treating for both hospital-acquired pneumonia and a catheter associated UTI pending results of culture data. (Urine cx with multiple morphotypes, so UTI less likely). -Since MRSA PCR is positive we will continue treatment with vancomycin and cefepime.  Bladder tumor -With urinary retention. -Foley catheter was placed during prior admission due to urinary retention. -Patient and patient's daughter wanted catheter removed.  Catheter was discontinued on 3/22 Patient has been voiding regularly without issues.  Plan to keep catheter out for now.  Chronic respiratory failure -Due to COPD that is not exacerbated at present. -Continue oxygen supplementation.  Hypokalemia -Adequately replaced, Mg 1.8.   DVT prophylaxis: SCDs Code Status: DNR Family Communication: Patient only Disposition Plan: Back to SNF once medically stable, anticipate 24 hours  Consultants:   None  Procedures:   None  Antimicrobials:  Anti-infectives (From admission, onward)   Start     Dose/Rate Route Frequency Ordered Stop   07/04/17 1800  vancomycin  (VANCOCIN) IVPB 1000 mg/200 mL premix     1,000 mg 200 mL/hr over 60 Minutes Intravenous Every 12 hours 07/04/17 1121     07/04/17 1400  ceFEPIme (MAXIPIME) 1 g in sodium chloride 0.9 % 100 mL IVPB  Status:  Discontinued     1 g 200 mL/hr over 30 Minutes Intravenous Every 8 hours 07/04/17 1121 07/04/17 1152   07/04/17 1400  ceFEPIme (MAXIPIME) 1 g in sodium chloride 0.9 % 100 mL IVPB     1 g 200 mL/hr over 30 Minutes Intravenous Every 8 hours 07/04/17 1141 07/12/17 1359   07/04/17 0700  ceFEPIme (MAXIPIME) 2 g in sodium chloride 0.9 % 100 mL IVPB     2 g 200 mL/hr over 30 Minutes Intravenous  Once 07/04/17 0647 07/04/17 0807   07/04/17 0700  vancomycin (VANCOCIN) IVPB 1000 mg/200 mL premix     1,000 mg 200 mL/hr over 60 Minutes Intravenous  Once 07/04/17 0647 07/04/17 0911       Subjective: No acute issues or complaints.  Objective: Vitals:   07/07/17 1204 07/07/17 1209 07/07/17 1400 07/07/17 1413  BP:   133/67   Pulse:   100   Resp:   20   Temp:   98.3 F (36.8 C)   TempSrc:   Oral   SpO2: (!) 41% 91% 91% 91%  Weight:      Height:        Intake/Output Summary (Last 24 hours) at 07/07/2017 1734 Last data filed at 07/07/2017 1200 Gross per 24 hour  Intake 480 ml  Output -  Net 480 ml  Filed Weights   07/04/17 1028 07/05/17 0500 07/06/17 0500  Weight: 84.6 kg (186 lb 8.2 oz) 82.4 kg (181 lb 10.5 oz) 81.3 kg (179 lb 3.7 oz)    Examination:  General exam: Alert, awake, oriented x 3 Respiratory system: Clear to auscultation. Respiratory effort normal, fair air movement, no active wheezing Cardiovascular system:RRR. No murmurs, rubs, gallops. Gastrointestinal system: Abdomen is nondistended, soft and nontender. No organomegaly or masses felt. Normal bowel sounds heard. Central nervous system: Alert and oriented. No focal neurological deficits. Extremities: No C/C/E, +pedal pulses Skin: No rashes, lesions or ulcers Psychiatry: Judgement and insight appear normal. Mood  & affect appropriate.     Data Reviewed: I have personally reviewed following labs and imaging studies  CBC: Recent Labs  Lab 07/04/17 0724 07/05/17 0446 07/06/17 0940 07/07/17 0845  WBC 44.0* 24.6* 30.8* 21.7*  NEUTROABS 39.2*  --   --   --   HGB 8.7* 7.5* 7.9* 8.2*  HCT 29.7* 24.8* 27.3* 28.1*  MCV 82.5 83.5 83.7 82.6  PLT 322 280 254 621   Basic Metabolic Panel: Recent Labs  Lab 07/04/17 0724 07/04/17 1535 07/05/17 0446  NA 138  --  141  K 3.1*  --  4.3  CL 92*  --  103  CO2 33*  --  31  GLUCOSE 216*  --  187*  BUN 18  --  18  CREATININE 0.90  --  0.78  CALCIUM 8.0*  --  8.0*  MG  --  1.8  --    GFR: Estimated Creatinine Clearance: 79.8 mL/min (by C-G formula based on SCr of 0.78 mg/dL). Liver Function Tests: Recent Labs  Lab 07/04/17 0724  AST 28  ALT 54  ALKPHOS 78  BILITOT 0.7  PROT 6.1*  ALBUMIN 2.6*   No results for input(s): LIPASE, AMYLASE in the last 168 hours. No results for input(s): AMMONIA in the last 168 hours. Coagulation Profile: No results for input(s): INR, PROTIME in the last 168 hours. Cardiac Enzymes: No results for input(s): CKTOTAL, CKMB, CKMBINDEX, TROPONINI in the last 168 hours. BNP (last 3 results) No results for input(s): PROBNP in the last 8760 hours. HbA1C: No results for input(s): HGBA1C in the last 72 hours. CBG: No results for input(s): GLUCAP in the last 168 hours. Lipid Profile: No results for input(s): CHOL, HDL, LDLCALC, TRIG, CHOLHDL, LDLDIRECT in the last 72 hours. Thyroid Function Tests: No results for input(s): TSH, T4TOTAL, FREET4, T3FREE, THYROIDAB in the last 72 hours. Anemia Panel: No results for input(s): VITAMINB12, FOLATE, FERRITIN, TIBC, IRON, RETICCTPCT in the last 72 hours. Urine analysis:    Component Value Date/Time   COLORURINE RED (A) 07/04/2017 0815   APPEARANCEUR CLOUDY (A) 07/04/2017 0815   APPEARANCEUR Turbid (A) 01/25/2016 1407   LABSPEC 1.020 07/04/2017 0815   PHURINE 7.0  07/04/2017 0815   GLUCOSEU 100 (A) 07/04/2017 0815   HGBUR LARGE (A) 07/04/2017 0815   BILIRUBINUR MODERATE (A) 07/04/2017 0815   BILIRUBINUR Negative 01/25/2016 1407   KETONESUR TRACE (A) 07/04/2017 0815   PROTEINUR 100 (A) 07/04/2017 0815   UROBILINOGEN 0.2 05/04/2014 0956   NITRITE POSITIVE (A) 07/04/2017 0815   LEUKOCYTESUR MODERATE (A) 07/04/2017 0815   LEUKOCYTESUR 3+ (A) 01/25/2016 1407   Sepsis Labs: @LABRCNTIP (procalcitonin:4,lacticidven:4)  ) Recent Results (from the past 240 hour(s))  Urine culture     Status: Abnormal   Collection Time: 07/04/17  6:41 AM  Result Value Ref Range Status   Specimen Description   Final  URINE, CLEAN CATCH Performed at [facility], [address] [place], [place] [address]    Special Requests   Final    NONE Performed at [facility], [address], [place], [place] [address]    Culture MULTIPLE SPECIES PRESENT, SUGGEST RECOLLECTION (A)  Final   Report Status 07/05/2017 FINAL  Final  Respiratory Panel by PCR     Status: None   Collection Time: 07/04/17  7:11 AM  Result Value Ref Range Status   Adenovirus NOT DETECTED NOT DETECTED Final   Coronavirus 229E NOT DETECTED NOT DETECTED Final   Coronavirus HKU1 NOT DETECTED NOT DETECTED Final   Coronavirus NL63 NOT DETECTED NOT DETECTED Final   Coronavirus OC43 NOT DETECTED NOT DETECTED Final   Metapneumovirus NOT DETECTED NOT DETECTED Final   Rhinovirus / Enterovirus NOT DETECTED NOT DETECTED Final   Influenza A NOT DETECTED NOT DETECTED Final   Influenza B NOT DETECTED NOT DETECTED Final   Parainfluenza Virus 1 NOT DETECTED NOT DETECTED Final   Parainfluenza Virus 2 NOT DETECTED NOT DETECTED Final   Parainfluenza Virus 3 NOT DETECTED NOT DETECTED Final   Parainfluenza Virus 4 NOT DETECTED NOT DETECTED Final   Respiratory Syncytial Virus NOT DETECTED NOT DETECTED Final   Bordetella pertussis NOT DETECTED NOT DETECTED Final   Chlamydophila pneumoniae NOT DETECTED NOT DETECTED Final     Mycoplasma pneumoniae NOT DETECTED NOT DETECTED Final    Comment: Performed at [facility], [place] [address], [place], [place] [address]  Blood Culture (routine x 2)     Status: None (Preliminary result)   Collection Time: 07/04/17  7:16 AM  Result Value Ref Range Status   Specimen Description BLOOD RIGHT HAND  Final   Special Requests   Final    BOTTLES DRAWN AEROBIC ONLY Blood Culture results may not be optimal due to an inadequate volume of blood received in culture bottles   Culture   Final    NO GROWTH 3 DAYS Performed at [facility], [address] [place], [place] [address]    Report Status PENDING  Incomplete  Blood Culture (routine x 2)     Status: None (Preliminary result)   Collection Time: 07/04/17  7:25 AM  Result Value Ref Range Status   Specimen Description BLOOD RIGHT ARM  Final   Special Requests   Final    BOTTLES DRAWN AEROBIC AND ANAEROBIC Blood Culture adequate volume   Culture   Final    NO GROWTH 3 DAYS Performed at [facility], [address] [place], [place] [address]    Report Status PENDING  Incomplete  Culture, sputum-assessment     Status: None   Collection Time: 07/04/17  1:32 PM  Result Value Ref Range Status   Specimen Description SPU  Final   Special Requests NONE  Final   Sputum evaluation   Final    THIS SPECIMEN IS ACCEPTABLE FOR SPUTUM CULTURE Performed at [facility], [address], [place], [place] [address]    Report Status 07/06/2017 FINAL  Final  Culture, respiratory (NON-Expectorated)     Status: None (Preliminary result)   Collection Time: 07/04/17  1:32 PM  Result Value Ref Range Status   Specimen Description   Final    SPU Performed at [facility], [address], [place], [place] [address]    Special Requests   Final    NONE Reflexed from [id number] Performed at [facility], [address] [place],  [address]    Gram Stain  Final    ABUNDANT WBC PRESENT, PREDOMINANTLY PMN NO ORGANISMS SEEN Performed at  [facility] [address], [place], [place] [address]    Culture PENDING  Incomplete   Report Status PENDING  Incomplete  MRSA PCR Screening     Status: Abnormal   Collection Time: 07/05/17 10:00 AM  Result Value Ref Range Status   MRSA by PCR POSITIVE (A) NEGATIVE Final    Comment:        The GeneXpert MRSA Assay (FDA approved for NASAL specimens only), is one component of a comprehensive MRSA colonization surveillance program. It is not intended to diagnose MRSA infection nor to guide or monitor treatment for MRSA infections. RESULT CALLED TO, READ BACK BY AND VERIFIED WITH: KOGER,L @1625  BY MATTEHWS, B 3.23.19 Performed at [facility], [address], [place], [place] [address]          Radiology Studies: No results found.      Scheduled Meds: . Chlorhexidine Gluconate Cloth  6 each Topical Q0600  . ipratropium-albuterol  3 mL Nebulization Q6H  . mupirocin ointment  1 application Nasal BID   Continuous Infusions: . sodium chloride 75 mL/hr at 07/07/17 0522  . ceFEPime (MAXIPIME) IV 1 g (07/07/17 1449)  . vancomycin Stopped (07/07/17 0714)     LOS: 3 days    Time spent: 25 minutes. Greater than 50% of this time was spent in direct contact with the patient coordinating care.     Lelon Frohlich, MD [facility] Pager [phone]  If 7PM-7AM, please contact night-coverage [email or web address] Password [facility] 07/07/2017, 5:34 PM

## 2017-07-07 NOTE — Care Management Important Message (Signed)
Important Message  Patient Details  Name: [name] [name] MRN: [id number] Date of Birth: [birth year]   Medicare Important Message Given:       Viraj, Liby 07/07/2017, 2:24 PM

## 2017-07-07 NOTE — Clinical Social Work Note (Signed)
Clinical Social Work Assessment  Patient Details  Name: [name] [name] MRN: [id number] Date of Birth: 1940-10-07  Date of referral:  07/07/17               Reason for consult:  Facility Placement                Permission sought to share information with:    Permission granted to share information::     Name::        Agency::  [facility]   Relationship::     Contact Information:     Housing/Transportation Living arrangements for the past 2 months:  [place], [place] of Information:  Facility Patient Interpreter Needed:  None Criminal Activity/Legal Involvement Pertinent to Current Situation/Hospitalization:  No - Comment as needed Significant Relationships:  Adult Children Lives with:  Facility Resident Do you feel safe going back to the place where you live?  No Need for family participation in patient care:  Yes (Comment)  Care giving concerns:  Facility resident. No concerns identified.    Social Worker assessment / plan:  Patient has been a resident at the facility for about one week. Patient had previously been at the facility earlier in the year but left due to being in copay days.  Upon going home, he could not manage living independently and returned to the facility last week.  Patient is on continuous oxygen 4L and requires assistance with ADLs.  He uses a wheelchair.  He can stand and pivot for short transfer.  Patient's daughter is supportive.   Employment status:  Retired Forensic scientist:  Medicaid In [place] PT Recommendations:  Not assessed at this time Information / Referral to community resources:     Patient/Family's Response to care: Patient is a long term resident.   Patient/Family's Understanding of and Emotional Response to Diagnosis, Current Treatment, and Prognosis:  Patient's family has been advised of diagnosis ,treatment and prognosis.    Emotional Assessment Appearance:  Appears stated  age Attitude/Demeanor/Rapport:    Affect (typically observed):  Unable to Assess Orientation:  Oriented to Self, Oriented to Place, Oriented to  Time, Oriented to Situation Alcohol / Substance use:  Not Applicable Psych involvement (Current and /or in the community):  No (Comment)  Discharge Needs  Concerns to be addressed:  Other (Comment Required(Return to [facility] ) Readmission within the last 30 days:  No Current discharge risk:  None Barriers to Discharge:  No Barriers Identified   Ihor Gully, LCSW 07/07/2017, 2:48 PM

## 2017-07-07 NOTE — Care Management Important Message (Signed)
Important Message  Patient Details  Name: [name] [name] MRN: [id number] Date of Birth: 02-24-1941   Medicare Important Message Given:       [name] [name], [name] [name] 07/07/2017, 2:24 PM

## 2017-07-07 NOTE — Progress Notes (Signed)
Pharmacy Antibiotic Note  [name] [name] is a 77 y.o. male admitted on 07/04/2017 with pneumonia.  Pharmacy has been consulted for Vancomycin and Cefepime dosing.  Plan:  Vancomycin 1000mg  IV q12h Check trough at steady state Cefepime 1gm  IV q8h Monitor labs, renal fxn, progress and c/s Deescalate ABX when improved / appropriate.    Antimicrobials this admission: Cefepime 3/22 >>  Vancomycin 3/22 >>   Dose adjustments this admission:  Microbiology results: 3/22  BCx: pending  UCx: pending   Sputum: pending   3/22 MRSA PCR: POSITIVE  Height: 5\' 9"  (175.3 cm) Weight: 179 lb 3.7 oz (81.3 kg) IBW/kg (Calculated) : 70.7  Temp (24hrs), Avg:98.6 F (37 C), Min:98.3 F (36.8 C), Max:98.9 F (37.2 C)  Recent Labs  Lab 07/04/17 0724 07/04/17 0857 07/05/17 0446 07/06/17 0940 07/07/17 0845  WBC 44.0*  --  24.6* 30.8* 21.7*  CREATININE 0.90  --  0.78  --   --   LATICACIDVEN 2.7* 1.9  --   --   --     Estimated Creatinine Clearance: 79.8 mL/min (by C-G formula based on SCr of 0.78 mg/dL).    Allergies  Allergen Reactions  . Metrizamide Hives  . Iohexol Hives     Code: HIVES, Desc: PT STATES HE BROKE OUT IN HIVES AND RASH AFTER CT NECK EARLY SEPT 2011; NO RESP PROBLEMS; NEEDS PRE-MEDS; MKS, Onset Date: [id number]   . Ivp Dye [Iodinated Diagnostic Agents] Hives   Thank you for allowing pharmacy to be a part of this patient's care.  Hart Robinsons A 07/07/2017 9:21 AM

## 2017-07-08 DIAGNOSIS — J418 Mixed simple and mucopurulent chronic bronchitis: Secondary | ICD-10-CM

## 2017-07-08 LAB — CBC
HEMATOCRIT: 24.5 % — AB (ref 39.0–52.0)
HEMOGLOBIN: 7.4 g/dL — AB (ref 13.0–17.0)
MCH: 24.6 pg — AB (ref 26.0–34.0)
MCHC: 30.2 g/dL (ref 30.0–36.0)
MCV: 81.4 fL (ref 78.0–100.0)
PLATELETS: 183 10*3/uL (ref 150–400)
RBC: 3.01 MIL/uL — AB (ref 4.22–5.81)
RDW: 18.6 % — ABNORMAL HIGH (ref 11.5–15.5)
WBC: 15.2 10*3/uL — ABNORMAL HIGH (ref 4.0–10.5)

## 2017-07-08 MED ORDER — AMOXICILLIN-POT CLAVULANATE 875-125 MG PO TABS: 1.0000 | ORAL_TABLET | Freq: Two times a day (BID) | ORAL | 0 refills | Status: AC

## 2017-07-08 NOTE — Care Management Important Message (Signed)
Important Message  Patient Details  Name: [name] [name] MRN: [id number] Date of Birth: 11/25/1940   Medicare Important Message Given:  Yes    Sherald Barge, RN 07/08/2017, 8:29 AM

## 2017-07-08 NOTE — Discharge Summary (Signed)
Physician Discharge Summary  [name] [name] WFU:[id number] DOB: [birth year] DOA: 07/04/2017  PCP: [name], MD  Admit date: 07/04/2017 Discharge date: 07/08/2017  Time spent: 45 minutes  Recommendations for Outpatient Follow-up:  -Will be discharged back to SNF today. -To complete course of Augmentin for pneumonia. -Radiation therapy of his bladder for his bladder tumor needs to be rescheduled as he was hospitalized when it was scheduled to start.  Discharge Diagnoses:  Active Problems:   Essential hypertension   Bladder tumor   Hyperlipidemia   COPD (chronic obstructive pulmonary disease) (HCC)   Chronic respiratory failure with hypoxia (HCC)   Sepsis (HCC)   Hypokalemia   Leukocytosis   Discharge Condition: Stable and improved  Filed Weights   07/04/17 1028 07/05/17 0500 07/06/17 0500  Weight: 84.6 kg (186 lb 8.2 oz) 82.4 kg (181 lb 10.5 oz) 81.3 kg (179 lb 3.7 oz)    History of present illness:  [name] [name] is a 77 y.o. male who was recently hospitalized for COPD exacerbation who presents today from nursing facility with fever.  He had a temp of 102.8 upon arrival, patient states he felt as if he had the flu with muscle aches.  He has also had increasing shortness of breath that had improved after his prior to discharge.  In the ED a code sepsis was called due to tachycardia, fever, lactic acidosis and a markedly increased WBC count of 44.  He is hemodynamically stable.  Foley catheter was placed during prior admission due to acute urinary retention with a history of bladder cancer.  UA today is cloudy with positive nitrates, many bacteria and 6-30 WBCs, chest x-ray shows COPD/emphysema with mild asymmetric interstitial edema.  He was given sepsis directed fluid boluses broad-spectrum antibiotics and admission was requested.    Hospital Course:   Sepsis -Sepsis parameters have resolved, leukocytosis hasd decreased to 15.2 from 44 on admission.   -Urine culture  with multiple morphotypes, as he was wearing a chronic Foley catheter on admission suspect this is likely a contaminant and will not treat. -To complete treatment with Augmentin for hospital-acquired pneumonia.  Bladder tumor -With urinary retention. -Foley catheter was placed during prior admission due to urinary retention. -Patient and patient's daughter wanted catheter removed.  Catheter was discontinued on 3/22 Patient has been voiding regularly without issues.  Plan to keep catheter out for now.  Chronic respiratory failure -Due to COPD that is not exacerbated at present. -Continue oxygen supplementation.  Hypokalemia -Adequately replaced, Mg 1.8.     Procedures:  None   Consultations:  None  Discharge Instructions  Discharge Instructions    Diet - low sodium heart healthy   Complete by:  As directed    Increase activity slowly   Complete by:  As directed      Allergies as of 07/08/2017      Reactions   Metrizamide Hives   Iohexol Hives    Code: HIVES, Desc: PT STATES HE BROKE OUT IN HIVES AND RASH AFTER CT NECK EARLY SEPT 2011; NO RESP PROBLEMS; NEEDS PRE-MEDS; MKS, Onset Date: [id number]   Ivp Dye [iodinated Diagnostic Agents] Hives      Medication List    STOP taking these medications   predniSONE 50 MG tablet Commonly known as:  DELTASONE     TAKE these medications   acetaminophen 325 MG tablet Commonly known as:  TYLENOL Take 650 mg by mouth every 6 (six) hours as needed for headache.   albuterol (2.5  MG/3ML) 0.083% nebulizer solution Commonly known as:  PROVENTIL Take 3 mLs (2.5 mg total) by nebulization every 4 (four) hours as needed. For shortness of breath   VENTOLIN HFA 108 (90 Base) MCG/ACT inhaler Generic drug:  albuterol Inhale 2 puffs into the lungs every 6 (six) hours as needed for wheezing or shortness of breath.   ALPRAZolam 1 MG tablet Commonly known as:  XANAX Take 1 tablet (1 mg total) by mouth 3 (three) times daily as needed  for anxiety or sleep.   amoxicillin-clavulanate 875-125 MG tablet Commonly known as:  AUGMENTIN Take 1 tablet by mouth 2 (two) times daily for 4 days.   atorvastatin 20 MG tablet Commonly known as:  LIPITOR Take 20 mg by mouth daily.   citalopram 20 MG tablet Commonly known as:  CELEXA Take 20 mg by mouth daily.   cyanocobalamin 500 MCG tablet Take 1,000 mcg by mouth daily.   docusate sodium 100 MG capsule Commonly known as:  COLACE Take 100 mg by mouth daily as needed for mild constipation or moderate constipation.   DULoxetine 60 MG capsule Commonly known as:  CYMBALTA Take 1 capsule (60 mg total) by mouth daily.   fluticasone 50 MCG/ACT nasal spray Commonly known as:  FLONASE Place 1 spray into both nostrils daily.   fluticasone furoate-vilanterol 200-25 MCG/INH Aepb Commonly known as:  BREO ELLIPTA Inhale 1 puff into the lungs daily.   guaiFENesin-dextromethorphan 100-10 MG/5ML syrup Commonly known as:  ROBITUSSIN DM Take 10 mLs by mouth every 6 (six) hours.   iron polysaccharides 150 MG capsule Commonly known as:  NIFEREX Take 150 mg by mouth 2 (two) times daily.   loratadine 10 MG tablet Commonly known as:  CLARITIN Take 10 mg by mouth daily.   mirtazapine 15 MG tablet Commonly known as:  REMERON Take 15 mg by mouth at bedtime.   montelukast 10 MG tablet Commonly known as:  SINGULAIR Take 10 mg by mouth at bedtime.   multivitamin with minerals Tabs tablet Take 1 tablet by mouth daily.   NITROSTAT 0.4 MG SL tablet Generic drug:  nitroGLYCERIN Place 1 tablet under the tongue every 5 (five) minutes as needed for chest pain.   omeprazole 20 MG capsule Commonly known as:  PRILOSEC Take 20 mg by mouth daily.   oxybutynin 5 MG tablet Commonly known as:  DITROPAN Take 5 mg by mouth 3 (three) times daily.   oxyCODONE 5 MG immediate release tablet Commonly known as:  Oxy IR/ROXICODONE Take 1 tablet by mouth 2 (two) times daily.   roflumilast 500 MCG  Tabs tablet Commonly known as:  DALIRESP Take 1 Tablet by mouth once daily   umeclidinium bromide 62.5 MCG/INH Aepb Commonly known as:  INCRUSE ELLIPTA Inhale 1 puff into the lungs daily.      Allergies  Allergen Reactions  . Metrizamide Hives  . Iohexol Hives     Code: HIVES, Desc: PT STATES HE BROKE OUT IN HIVES AND RASH AFTER CT NECK EARLY SEPT 2011; NO RESP PROBLEMS; NEEDS PRE-MEDS; MKS, Onset Date: [id number]   . Ivp Dye [Iodinated Diagnostic Agents] Hives   Follow-up Information    [name], MD. Schedule an appointment as soon as possible for a visit in 2 week(s).   Specialty:  Family Medicine Contact information: [address] [place] [address] [phone]            The results of significant diagnostics from this hospitalization (including imaging, microbiology, ancillary and laboratory) are  listed below for reference.    Significant Diagnostic Studies: US Renal  Result Date: 06/22/2017 CLINICAL DATA:  Acute kidney injury. History of bladder tumor resection in 2013. EXAM: RENAL / URINARY TRACT ULTRASOUND COMPLETE COMPARISON:  CT abdomen dated 04/01/2017. FINDINGS: Right Kidney: Length: 10.8 cm. Echogenicity within normal limits. Benign cyst again noted within the midpole region, measuring 1.5 cm. No suspicious mass or hydronephrosis visualized. Left Kidney: Length: 10.6 cm. Echogenicity within normal limits. Small benign cyst again noted within the lower pole, measuring 1 cm. No suspicious mass or hydronephrosis visualized. Bladder: Bladder decompressed by Foley catheter. IMPRESSION: 1. No acute or significant findings. Both kidneys demonstrate normal cortical thickness and echogenicity. Small bilateral renal cysts, as also described on CT of 04/01/2017. 2. Bladder decompressed by Foley catheter. Electronically Signed   By: Franki Cabot M.D.   On: 06/22/2017 12:14   Dg Chest Port 1 View  Result Date: 07/04/2017 CLINICAL DATA:  Sudden  onset shortness of breath, COPD, emphysema, smoker EXAM: PORTABLE CHEST 1 VIEW COMPARISON:  06/21/2017 FINDINGS: Background COPD/emphysema noted. Mild superimposed and asymmetric diffuse interstitial opacities suggesting a component of early edema. Minor basilar atelectasis. No large effusion or pneumothorax. Trachea is midline. Normal heart size. Trachea is midline. No osseous abnormality. 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 Electronically Signed   By: Jerilynn Mages.  Shick M.D.   On: 07/04/2017 07:22   Dg Chest Port 1 View  Result Date: 06/21/2017 CLINICAL DATA:  SOB, pt on c-pap mask EXAM: PORTABLE CHEST 1 VIEW COMPARISON:  04/23/2017 FINDINGS: Cardiac silhouette is normal in size. No mediastinal or hilar masses. There are prominent bronchovascular markings which are similar to prior exams. There is a possible component of interstitial edema. No evidence of pneumonia. No pleural effusion or pneumothorax. Skeletal structures are grossly intact. IMPRESSION: 1. Prominent bronchovascular markings similar to prior exams, likely chronic. A component of interstitial edema should be considered given the reported shortness of breath. No evidence of pneumonia. Electronically Signed   By: Lajean Manes M.D.   On: 06/21/2017 16:48    Microbiology: Recent Results (from the past 240 hour(s))  Urine culture     Status: Abnormal   Collection Time: 07/04/17  6:41 AM  Result Value Ref Range Status   Specimen Description   Final    URINE, CLEAN CATCH Performed at [facility], [address], [place], [place] [address]    Special Requests   Final    NONE Performed at [facility], [address] [place], [place] [address]    Culture MULTIPLE SPECIES PRESENT, SUGGEST RECOLLECTION (A)  Final   Report Status 07/05/2017 FINAL  Final  Respiratory Panel by PCR     Status: None   Collection Time: 07/04/17  7:11 AM  Result Value Ref Range Status   Adenovirus NOT DETECTED NOT  DETECTED Final   Coronavirus 229E NOT DETECTED NOT DETECTED Final   Coronavirus HKU1 NOT DETECTED NOT DETECTED Final   Coronavirus NL63 NOT DETECTED NOT DETECTED Final   Coronavirus OC43 NOT DETECTED NOT DETECTED Final   Metapneumovirus NOT DETECTED NOT DETECTED Final   Rhinovirus / Enterovirus NOT DETECTED NOT DETECTED Final   Influenza A NOT DETECTED NOT DETECTED Final   Influenza B NOT DETECTED NOT DETECTED Final   Parainfluenza Virus 1 NOT DETECTED NOT DETECTED Final   Parainfluenza Virus 2 NOT DETECTED NOT DETECTED Final   Parainfluenza Virus 3 NOT DETECTED NOT DETECTED Final   Parainfluenza Virus 4 NOT DETECTED NOT DETECTED  Final   Respiratory Syncytial Virus NOT DETECTED NOT DETECTED Final   Bordetella pertussis NOT DETECTED NOT DETECTED Final   Chlamydophila pneumoniae NOT DETECTED NOT DETECTED Final   Mycoplasma pneumoniae NOT DETECTED NOT DETECTED Final    Comment: Performed at [facility], [place] [address], [place], [place] [address]  Blood Culture (routine x 2)     Status: None (Preliminary result)   Collection Time: 07/04/17  7:16 AM  Result Value Ref Range Status   Specimen Description BLOOD RIGHT HAND  Final   Special Requests   Final    BOTTLES DRAWN AEROBIC ONLY Blood Culture results may not be optimal due to an inadequate volume of blood received in culture bottles   Culture   Final    NO GROWTH 4 DAYS Performed at [facility], [address], [place], [place] [address]    Report Status PENDING  Incomplete  Blood Culture (routine x 2)     Status: None (Preliminary result)   Collection Time: 07/04/17  7:25 AM  Result Value Ref Range Status   Specimen Description BLOOD RIGHT ARM  Final   Special Requests   Final    BOTTLES DRAWN AEROBIC AND ANAEROBIC Blood Culture adequate volume   Culture   Final    NO GROWTH 4 DAYS Performed at [facility], [address], [place], [place] [address]    Report Status PENDING  Incomplete  Culture, sputum-assessment      Status: None   Collection Time: 07/04/17  1:32 PM  Result Value Ref Range Status   Specimen Description SPU  Final   Special Requests NONE  Final   Sputum evaluation   Final    THIS SPECIMEN IS ACCEPTABLE FOR SPUTUM CULTURE Performed at [facility], [address] [place], [place] [address]    Report Status 07/06/2017 FINAL  Final  Culture, respiratory (NON-Expectorated)     Status: None (Preliminary result)   Collection Time: 07/04/17  1:32 PM  Result Value Ref Range Status   Specimen Description   Final    SPU Performed at [facility] [address], [place], [place] [address]    Special Requests   Final    NONE Reflexed from [id number] Performed at [facility], [address], [place], [place] [address]    Gram Stain   Final    ABUNDANT WBC PRESENT, PREDOMINANTLY PMN NO ORGANISMS SEEN    Culture   Final    CULTURE REINCUBATED FOR BETTER GROWTH Performed at [facility], [place] [address], [place], [place] [address]    Report Status PENDING  Incomplete  MRSA PCR Screening     Status: Abnormal   Collection Time: 07/05/17 10:00 AM  Result Value Ref Range Status   MRSA by PCR POSITIVE (A) NEGATIVE Final    Comment:        The GeneXpert MRSA Assay (FDA approved for NASAL specimens only), is one component of a comprehensive MRSA colonization surveillance program. It is not intended to diagnose MRSA infection nor to guide or monitor treatment for MRSA infections. RESULT CALLED TO, READ BACK BY AND VERIFIED WITH: KOGER,L @1625  BY MATTEHWS, B 3.23.19 Performed at [facility], [address], [place], [place] [address]      Labs: Basic Metabolic Panel: Recent Labs  Lab 07/04/17 0724 07/04/17 1535 07/05/17 0446  NA 138  --  141  K 3.1*  --  4.3  CL 92*  --  103  CO2 33*  --  31  GLUCOSE 216*  --  187*  BUN 18  --  18  CREATININE 0.90  --  0.78  CALCIUM 8.0*  --  8.0*  MG  --  1.8  --    Liver Function Tests: Recent Labs  Lab 07/04/17 0724  AST 28  ALT  54  ALKPHOS 78  BILITOT 0.7  PROT 6.1*  ALBUMIN 2.6*   No results for input(s): LIPASE, AMYLASE in the last 168 hours. No results for input(s): AMMONIA in the last 168 hours. CBC: Recent Labs  Lab 07/04/17 0724 07/05/17 0446 07/06/17 0940 07/07/17 0845 07/08/17 0830  WBC 44.0* 24.6* 30.8* 21.7* 15.2*  NEUTROABS 39.2*  --   --   --   --   HGB 8.7* 7.5* 7.9* 8.2* 7.4*  HCT 29.7* 24.8* 27.3* 28.1* 24.5*  MCV 82.5 83.5 83.7 82.6 81.4  PLT 322 280 254 237 183   Cardiac Enzymes: No results for input(s): CKTOTAL, CKMB, CKMBINDEX, TROPONINI in the last 168 hours. BNP: BNP (last 3 results) Recent Labs    06/21/17 1657 07/04/17 0725  BNP 49.0 123.0*    ProBNP (last 3 results) No results for input(s): PROBNP in the last 8760 hours.  CBG: No results for input(s): GLUCAP in the last 168 hours.     Signed:  Lelon Frohlich  [facility] Pager: [phone] 07/08/2017, 12:31 PM

## 2017-07-08 NOTE — Clinical Social Work Note (Signed)
Pt discharging back to [facility] today. Rudi Heap at the SNF. Pt's RN to assist with transport set up. Will send dc clinical through the hub. No other SW needs for dc.

## 2017-07-08 NOTE — Progress Notes (Signed)
Iv removed, patient tolerated well, report called to Arbie Cookey, Therapist, sports at [facility], and all questions answered.  Patient transported to [facility] facility via [facility].

## 2017-07-08 NOTE — NC FL2 (Signed)
[place] MEDICAID FL2 LEVEL OF CARE SCREENING TOOL     IDENTIFICATION  Patient Name: [name] [name] Birthdate: [birth year] Sex: male Admission Date (Current Location): 07/04/2017  [facility] and [place] Number:  [employer] and Address:  [place] [address] [place]      Provider Number: [id number]  Attending Physician Name and Address:  Isaac Bliss, Olam Idler*  Relative Name and Phone Number:       Current Level of Care: Hospital Recommended Level of Care: [place] Prior Approval Number:    Date Approved/Denied:   PASRR Number: [id number]  Discharge Plan: SNF    Current Diagnoses: Patient Active Problem List   Diagnosis Date Noted  . Sepsis ([place]) 07/04/2017  . Hypokalemia 07/04/2017  . Leukocytosis 07/04/2017  . Urothelial carcinoma ([place]) 06/25/2017  . AKI (acute kidney injury) ([place])   . Severe anemia 04/22/2017  . Shortness of breath   . Acute on chronic respiratory failure ([place]) 10/24/2015  . Pressure ulcer 09/25/2015  . Respiratory failure ([place]) 09/24/2015  . Acute on chronic respiratory failure with hypercapnia ([place])   . Frequent falls 09/21/2015  . HLD (hyperlipidemia) 08/18/2015  . Chronic anemia 08/18/2015  . COPD exacerbation ([place]) 08/18/2015  . Chronic pain syndrome 05/01/2015  . Chronic respiratory failure with hypoxia ([place])   . Acute on chronic respiratory failure with hypoxia ([place]) 02/25/2014  . COPD (chronic obstructive pulmonary disease) ([place]) 02/09/2014  . Hyperglycemia, drug-induced 03/26/2013  . Chronic back pain   . Anxiety 03/13/2012  . Hyperlipidemia 03/13/2012  . BPH (benign prostatic hyperplasia) 10/01/2011  . Tobacco abuse 09/29/2011  . Bladder tumor 09/28/2011  . PULMONARY NODULE 12/27/2009  . Essential hypertension 12/26/2009  . OSTEOARTHRITIS 12/26/2009    Orientation RESPIRATION BLADDER Height & Weight     Self, Time, Situation, Place  O2(see dc summary) Continent  Weight: 179 lb 3.7 oz (81.3 kg) Height:  5\' 9"  (175.3 cm)  BEHAVIORAL SYMPTOMS/MOOD NEUROLOGICAL BOWEL NUTRITION STATUS      Continent Diet(heart healthy)  AMBULATORY STATUS COMMUNICATION OF NEEDS Skin   Limited Assist Verbally Normal                       Personal Care Assistance Level of Assistance  Bathing, Feeding, Dressing Bathing Assistance: Limited assistance Feeding assistance: Independent Dressing Assistance: Limited assistance     Functional Limitations Info  Sight Sight Info: Adequate Hearing Info: Adequate Speech Info: Adequate    SPECIAL CARE FACTORS FREQUENCY  PT (By licensed PT)     PT Frequency: 3-5 times week              Contractures Contractures Info: Not present    Additional Factors Info  Code Status, Allergies Code Status Info: DNR Allergies Info: Metrizamide, lohexol, ivp dye Psychotropic Info: Xanax         Current Medications (07/08/2017):  This is the current hospital active medication list Current Facility-Administered Medications  Medication Dose Route Frequency Provider Last Rate Last Dose  . 0.9 %  sodium chloride infusion   Intravenous Continuous Isaac Bliss, Rayford Halsted, MD 75 mL/hr at 07/07/17 2151    . acetaminophen (TYLENOL) tablet 650 mg  650 mg Oral Q6H PRN Isaac Bliss, Rayford Halsted, MD   650 mg at 07/08/17 6440   Or  . acetaminophen (TYLENOL) suppository 650 mg  650 mg Rectal Q6H PRN Isaac Bliss, Rayford Halsted, MD      . albuterol (PROVENTIL) (2.5 MG/3ML)  0.083% nebulizer solution 2.5 mg  2.5 mg Nebulization Q2H PRN Isaac Bliss, Rayford Halsted, MD      . ALPRAZolam Duanne Moron) tablet 1 mg  1 mg Oral TID PRN Vertis Kelch, NP   1 mg at 07/08/17 0618  . ceFEPIme (MAXIPIME) 1 g in sodium chloride 0.9 % 100 mL IVPB  1 g Intravenous Q8H Isaac Bliss, Rayford Halsted, MD 200 mL/hr at 07/08/17 0613 1 g at 07/08/17 1607  . Chlorhexidine Gluconate Cloth 2 % PADS 6 each  6 each Topical Q0600 Isaac Bliss, Rayford Halsted, MD   6  each at 07/08/17 [phone]  . ipratropium-albuterol (DUONEB) 0.5-2.5 (3) MG/3ML nebulizer solution 3 mL  3 mL Nebulization Q6H Isaac Bliss, Rayford Halsted, MD   3 mL at 07/08/17 0744  . mupirocin ointment (BACTROBAN) 2 % 1 application  1 application Nasal BID Isaac Bliss, Rayford Halsted, MD   1 application at 62/69/48 [phone]  . ondansetron (ZOFRAN) tablet 4 mg  4 mg Oral Q6H PRN Isaac Bliss, Rayford Halsted, MD       Or  . ondansetron [facility]) injection 4 mg  4 mg Intravenous Q6H PRN Isaac Bliss, Rayford Halsted, MD      . oxyCODONE (Oxy IR/ROXICODONE) immediate release tablet 5 mg  5 mg Oral Q6H PRN Bodenheimer, Charles A, NP   5 mg at 07/06/17 1807  . senna-docusate (Senokot-S) tablet 1 tablet  1 tablet Oral QHS PRN Isaac Bliss, Rayford Halsted, MD      . vancomycin (VANCOCIN) IVPB 1000 mg/200 mL premix  1,000 mg Intravenous Q12H Isaac Bliss, Rayford Halsted, MD   Stopped at 07/08/17 [phone]     Discharge Medications: Please see discharge summary for a list of discharge medications.  Relevant Imaging Results:  Relevant Lab Results:   Additional Information SSN: [place], LCSW

## 2017-07-09 LAB — CULTURE, RESPIRATORY: CULTURE: NORMAL

## 2017-07-09 LAB — CULTURE, BLOOD (ROUTINE X 2)
Culture: NO GROWTH
Culture: NO GROWTH
SPECIAL REQUESTS: ADEQUATE

## 2017-07-09 LAB — CULTURE, RESPIRATORY W GRAM STAIN

## 2017-07-16 ENCOUNTER — Other Ambulatory Visit: Payer: Self-pay

## 2017-07-16 ENCOUNTER — Observation Stay (HOSPITAL_COMMUNITY)
Admission: EM | Admit: 2017-07-16 | Discharge: 2017-07-17 | Disposition: A | Discharge status: Skilled Nursing Facility | Payer: Medicare Other | Attending: Internal Medicine | Admitting: Internal Medicine

## 2017-07-16 ENCOUNTER — Encounter (HOSPITAL_COMMUNITY): Payer: Self-pay | Admitting: *Deleted

## 2017-07-16 ENCOUNTER — Emergency Department (HOSPITAL_COMMUNITY): Payer: Medicare Other

## 2017-07-16 DIAGNOSIS — Z9181 History of falling: Secondary | ICD-10-CM | POA: Insufficient documentation

## 2017-07-16 DIAGNOSIS — R319 Hematuria, unspecified: Secondary | ICD-10-CM | POA: Diagnosis not present

## 2017-07-16 DIAGNOSIS — J810 Acute pulmonary edema: Secondary | ICD-10-CM | POA: Diagnosis present

## 2017-07-16 DIAGNOSIS — D62 Acute posthemorrhagic anemia: Principal | ICD-10-CM

## 2017-07-16 DIAGNOSIS — K219 Gastro-esophageal reflux disease without esophagitis: Secondary | ICD-10-CM | POA: Insufficient documentation

## 2017-07-16 DIAGNOSIS — J811 Chronic pulmonary edema: Secondary | ICD-10-CM | POA: Diagnosis not present

## 2017-07-16 DIAGNOSIS — N281 Cyst of kidney, acquired: Secondary | ICD-10-CM | POA: Insufficient documentation

## 2017-07-16 DIAGNOSIS — Z8701 Personal history of pneumonia (recurrent): Secondary | ICD-10-CM | POA: Insufficient documentation

## 2017-07-16 DIAGNOSIS — Z91018 Allergy to other foods: Secondary | ICD-10-CM | POA: Insufficient documentation

## 2017-07-16 DIAGNOSIS — Z79899 Other long term (current) drug therapy: Secondary | ICD-10-CM | POA: Insufficient documentation

## 2017-07-16 DIAGNOSIS — M549 Dorsalgia, unspecified: Secondary | ICD-10-CM | POA: Diagnosis not present

## 2017-07-16 DIAGNOSIS — D649 Anemia, unspecified: Secondary | ICD-10-CM

## 2017-07-16 DIAGNOSIS — I10 Essential (primary) hypertension: Secondary | ICD-10-CM | POA: Diagnosis present

## 2017-07-16 DIAGNOSIS — Z7951 Long term (current) use of inhaled steroids: Secondary | ICD-10-CM | POA: Insufficient documentation

## 2017-07-16 DIAGNOSIS — R911 Solitary pulmonary nodule: Secondary | ICD-10-CM | POA: Insufficient documentation

## 2017-07-16 DIAGNOSIS — N400 Enlarged prostate without lower urinary tract symptoms: Secondary | ICD-10-CM | POA: Insufficient documentation

## 2017-07-16 DIAGNOSIS — G8929 Other chronic pain: Secondary | ICD-10-CM | POA: Insufficient documentation

## 2017-07-16 DIAGNOSIS — E785 Hyperlipidemia, unspecified: Secondary | ICD-10-CM | POA: Insufficient documentation

## 2017-07-16 DIAGNOSIS — I340 Nonrheumatic mitral (valve) insufficiency: Secondary | ICD-10-CM | POA: Insufficient documentation

## 2017-07-16 DIAGNOSIS — Z885 Allergy status to narcotic agent status: Secondary | ICD-10-CM | POA: Diagnosis not present

## 2017-07-16 DIAGNOSIS — E7800 Pure hypercholesterolemia, unspecified: Secondary | ICD-10-CM | POA: Insufficient documentation

## 2017-07-16 DIAGNOSIS — Z888 Allergy status to other drugs, medicaments and biological substances status: Secondary | ICD-10-CM | POA: Insufficient documentation

## 2017-07-16 DIAGNOSIS — J441 Chronic obstructive pulmonary disease with (acute) exacerbation: Secondary | ICD-10-CM | POA: Insufficient documentation

## 2017-07-16 DIAGNOSIS — Z841 Family history of disorders of kidney and ureter: Secondary | ICD-10-CM | POA: Insufficient documentation

## 2017-07-16 DIAGNOSIS — Z8711 Personal history of peptic ulcer disease: Secondary | ICD-10-CM | POA: Insufficient documentation

## 2017-07-16 DIAGNOSIS — N179 Acute kidney failure, unspecified: Secondary | ICD-10-CM | POA: Insufficient documentation

## 2017-07-16 DIAGNOSIS — F17210 Nicotine dependence, cigarettes, uncomplicated: Secondary | ICD-10-CM | POA: Insufficient documentation

## 2017-07-16 DIAGNOSIS — C679 Malignant neoplasm of bladder, unspecified: Secondary | ICD-10-CM | POA: Insufficient documentation

## 2017-07-16 DIAGNOSIS — Z9981 Dependence on supplemental oxygen: Secondary | ICD-10-CM | POA: Diagnosis not present

## 2017-07-16 DIAGNOSIS — Z825 Family history of asthma and other chronic lower respiratory diseases: Secondary | ICD-10-CM | POA: Insufficient documentation

## 2017-07-16 DIAGNOSIS — F329 Major depressive disorder, single episode, unspecified: Secondary | ICD-10-CM | POA: Insufficient documentation

## 2017-07-16 DIAGNOSIS — J9620 Acute and chronic respiratory failure, unspecified whether with hypoxia or hypercapnia: Secondary | ICD-10-CM | POA: Insufficient documentation

## 2017-07-16 DIAGNOSIS — Z91041 Radiographic dye allergy status: Secondary | ICD-10-CM | POA: Insufficient documentation

## 2017-07-16 DIAGNOSIS — Z515 Encounter for palliative care: Secondary | ICD-10-CM | POA: Insufficient documentation

## 2017-07-16 DIAGNOSIS — Z7189 Other specified counseling: Secondary | ICD-10-CM

## 2017-07-16 DIAGNOSIS — Z9049 Acquired absence of other specified parts of digestive tract: Secondary | ICD-10-CM | POA: Insufficient documentation

## 2017-07-16 DIAGNOSIS — D494 Neoplasm of unspecified behavior of bladder: Secondary | ICD-10-CM

## 2017-07-16 LAB — CBC WITH DIFFERENTIAL/PLATELET
Basophils Absolute: 0 10*3/uL (ref 0.0–0.1)
Basophils Relative: 0 %
EOS ABS: 0.1 10*3/uL (ref 0.0–0.7)
Eosinophils Relative: 2 %
HCT: 21 % — ABNORMAL LOW (ref 39.0–52.0)
Hemoglobin: 6.1 g/dL — CL (ref 13.0–17.0)
Lymphocytes Relative: 18 %
Lymphs Abs: 1.5 10*3/uL (ref 0.7–4.0)
MCH: 22.9 pg — ABNORMAL LOW (ref 26.0–34.0)
MCHC: 29 g/dL — AB (ref 30.0–36.0)
MCV: 78.9 fL (ref 78.0–100.0)
MONO ABS: 1 10*3/uL (ref 0.1–1.0)
MONOS PCT: 13 %
Neutro Abs: 5.5 10*3/uL (ref 1.7–7.7)
Neutrophils Relative %: 67 %
PLATELETS: 455 10*3/uL — AB (ref 150–400)
RBC: 2.66 MIL/uL — ABNORMAL LOW (ref 4.22–5.81)
RDW: 19 % — AB (ref 11.5–15.5)
WBC: 8.2 10*3/uL (ref 4.0–10.5)

## 2017-07-16 LAB — TROPONIN I: Troponin I: <0.03 ng/mL (ref <0.03)

## 2017-07-16 LAB — COMPREHENSIVE METABOLIC PANEL
ALBUMIN: 2.6 g/dL — AB (ref 3.5–5.0)
ALT: 23 U/L (ref 17–63)
ANION GAP: 12 (ref 5–15)
AST: 18 U/L (ref 15–41)
Alkaline Phosphatase: 66 U/L (ref 38–126)
BUN: 10 mg/dL (ref 6–20)
CO2: 30 mmol/L (ref 22–32)
Calcium: 8.4 mg/dL — ABNORMAL LOW (ref 8.9–10.3)
Chloride: 92 mmol/L — ABNORMAL LOW (ref 101–111)
Creatinine, Ser: 1.2 mg/dL (ref 0.61–1.24)
GFR calc Af Amer: >60 mL/min (ref >60)
GFR calc non Af Amer: 57 mL/min — ABNORMAL LOW (ref >60)
GLUCOSE: 118 mg/dL — AB (ref 65–99)
POTASSIUM: 3.7 mmol/L (ref 3.5–5.1)
SODIUM: 134 mmol/L — AB (ref 135–145)
TOTAL PROTEIN: 6.7 g/dL (ref 6.5–8.1)
Total Bilirubin: 0.8 mg/dL (ref 0.3–1.2)

## 2017-07-16 LAB — PREPARE RBC (CROSSMATCH)

## 2017-07-16 LAB — PROTIME-INR
INR: 1.14
PROTHROMBIN TIME: 14.5 s (ref 11.4–15.2)

## 2017-07-16 LAB — BRAIN NATRIURETIC PEPTIDE: B Natriuretic Peptide: 261 pg/mL — ABNORMAL HIGH (ref 0.0–100.0)

## 2017-07-16 LAB — APTT: aPTT: 36 seconds (ref 24–36)

## 2017-07-16 MED ORDER — SODIUM CHLORIDE 0.9 % IV SOLN: 250.0000 mL | INTRAVENOUS | Status: DC | PRN

## 2017-07-16 MED ORDER — FLUTICASONE PROPIONATE 50 MCG/ACT NA SUSP
1.0000 | Freq: Every day | NASAL | Status: DC
  Administered 2017-07-17: 1 via NASAL
  Filled 2017-07-16: qty 16

## 2017-07-16 MED ORDER — DOCUSATE SODIUM 100 MG PO CAPS
100.0000 mg | ORAL_CAPSULE | Freq: Every day | ORAL | Status: DC | PRN
  Administered 2017-07-17: 100 mg via ORAL
  Filled 2017-07-16: qty 1

## 2017-07-16 MED ORDER — MONTELUKAST SODIUM 10 MG PO TABS
10.0000 mg | ORAL_TABLET | Freq: Every day | ORAL | Status: DC
  Administered 2017-07-16 – 2017-07-17 (×2): 10 mg via ORAL
  Filled 2017-07-16 (×2): qty 1

## 2017-07-16 MED ORDER — UMECLIDINIUM BROMIDE 62.5 MCG/INH IN AEPB
1.0000 | INHALATION_SPRAY | Freq: Every day | RESPIRATORY_TRACT | Status: DC
  Administered 2017-07-17: 1 via RESPIRATORY_TRACT
  Filled 2017-07-16: qty 7

## 2017-07-16 MED ORDER — SENNOSIDES-DOCUSATE SODIUM 8.6-50 MG PO TABS: 1.0000 | ORAL_TABLET | Freq: Every evening | ORAL | Status: DC | PRN

## 2017-07-16 MED ORDER — FUROSEMIDE 10 MG/ML IJ SOLN
20.0000 mg | Freq: Once | INTRAMUSCULAR | Status: AC
  Administered 2017-07-16: 20 mg via INTRAVENOUS
  Filled 2017-07-16: qty 2

## 2017-07-16 MED ORDER — FLUTICASONE FUROATE-VILANTEROL 200-25 MCG/INH IN AEPB
1.0000 | INHALATION_SPRAY | Freq: Every day | RESPIRATORY_TRACT | Status: DC
  Administered 2017-07-17: 1 via RESPIRATORY_TRACT
  Filled 2017-07-16: qty 28

## 2017-07-16 MED ORDER — MIRTAZAPINE 15 MG PO TABS
15.0000 mg | ORAL_TABLET | Freq: Every day | ORAL | Status: DC
  Administered 2017-07-16 – 2017-07-17 (×2): 15 mg via ORAL
  Filled 2017-07-16 (×2): qty 1

## 2017-07-16 MED ORDER — SODIUM CHLORIDE 0.9% FLUSH
3.0000 mL | INTRAVENOUS | Status: DC | PRN
Start: 2017-07-16 — End: 2017-07-17

## 2017-07-16 MED ORDER — METHYLPREDNISOLONE SODIUM SUCC 125 MG IJ SOLR
125.0000 mg | Freq: Once | INTRAMUSCULAR | Status: AC
  Administered 2017-07-16: 125 mg via INTRAVENOUS
  Filled 2017-07-16: qty 2

## 2017-07-16 MED ORDER — ATORVASTATIN CALCIUM 20 MG PO TABS
20.0000 mg | ORAL_TABLET | Freq: Every day | ORAL | Status: DC
  Administered 2017-07-16 – 2017-07-17 (×2): 20 mg via ORAL
  Filled 2017-07-16 (×2): qty 1

## 2017-07-16 MED ORDER — ACETAMINOPHEN 325 MG PO TABS: 650.0000 mg | ORAL_TABLET | Freq: Four times a day (QID) | ORAL | Status: DC | PRN

## 2017-07-16 MED ORDER — LORATADINE 10 MG PO TABS
10.0000 mg | ORAL_TABLET | Freq: Every day | ORAL | Status: DC
  Administered 2017-07-17: 10 mg via ORAL
  Filled 2017-07-16: qty 1

## 2017-07-16 MED ORDER — ALBUTEROL SULFATE (2.5 MG/3ML) 0.083% IN NEBU
2.5000 mg | INHALATION_SOLUTION | RESPIRATORY_TRACT | Status: DC | PRN
  Administered 2017-07-17: 2.5 mg via RESPIRATORY_TRACT
  Filled 2017-07-16: qty 3

## 2017-07-16 MED ORDER — ONDANSETRON HCL 4 MG/2ML IJ SOLN: 4.0000 mg | Freq: Four times a day (QID) | INTRAMUSCULAR | Status: DC | PRN

## 2017-07-16 MED ORDER — ADULT MULTIVITAMIN W/MINERALS CH
1.0000 | ORAL_TABLET | Freq: Every day | ORAL | Status: DC
  Administered 2017-07-17: 1 via ORAL
  Filled 2017-07-16: qty 1

## 2017-07-16 MED ORDER — DULOXETINE HCL 60 MG PO CPEP
60.0000 mg | ORAL_CAPSULE | Freq: Every day | ORAL | Status: DC
  Administered 2017-07-16 – 2017-07-17 (×2): 60 mg via ORAL
  Filled 2017-07-16 (×2): qty 1

## 2017-07-16 MED ORDER — SODIUM CHLORIDE 0.9 % IV SOLN
10.0000 mL/h | Freq: Once | INTRAVENOUS | Status: DC
  Administered 2017-07-16: 10 mL/h via INTRAVENOUS

## 2017-07-16 MED ORDER — SODIUM CHLORIDE 0.9% FLUSH
3.0000 mL | Freq: Two times a day (BID) | INTRAVENOUS | Status: DC
  Administered 2017-07-16 – 2017-07-17 (×2): 3 mL via INTRAVENOUS

## 2017-07-16 MED ORDER — OXYBUTYNIN CHLORIDE 5 MG PO TABS
5.0000 mg | ORAL_TABLET | Freq: Three times a day (TID) | ORAL | Status: DC
  Administered 2017-07-16 – 2017-07-17 (×4): 5 mg via ORAL
  Filled 2017-07-16 (×4): qty 1

## 2017-07-16 MED ORDER — NITROGLYCERIN 0.4 MG SL SUBL: 0.4000 mg | SUBLINGUAL_TABLET | SUBLINGUAL | Status: DC | PRN

## 2017-07-16 MED ORDER — PANTOPRAZOLE SODIUM 40 MG PO TBEC
40.0000 mg | DELAYED_RELEASE_TABLET | Freq: Every day | ORAL | Status: DC
  Administered 2017-07-16 – 2017-07-17 (×2): 40 mg via ORAL
  Filled 2017-07-16 (×2): qty 1

## 2017-07-16 MED ORDER — VITAMIN B-12 100 MCG PO TABS
500.0000 ug | ORAL_TABLET | Freq: Every day | ORAL | Status: DC
  Administered 2017-07-17: 500 ug via ORAL
  Filled 2017-07-16: qty 5

## 2017-07-16 MED ORDER — ACETAMINOPHEN 650 MG RE SUPP: 650.0000 mg | Freq: Four times a day (QID) | RECTAL | Status: DC | PRN

## 2017-07-16 MED ORDER — CITALOPRAM HYDROBROMIDE 20 MG PO TABS
20.0000 mg | ORAL_TABLET | Freq: Every day | ORAL | Status: DC
  Administered 2017-07-17: 20 mg via ORAL
  Filled 2017-07-16: qty 1

## 2017-07-16 MED ORDER — POLYSACCHARIDE IRON COMPLEX 150 MG PO CAPS
150.0000 mg | ORAL_CAPSULE | Freq: Every day | ORAL | Status: DC
  Administered 2017-07-17: 150 mg via ORAL
  Filled 2017-07-16: qty 1

## 2017-07-16 MED ORDER — ALBUTEROL SULFATE (2.5 MG/3ML) 0.083% IN NEBU
5.0000 mg | INHALATION_SOLUTION | Freq: Once | RESPIRATORY_TRACT | Status: AC
  Administered 2017-07-16: 5 mg via RESPIRATORY_TRACT
  Filled 2017-07-16: qty 6

## 2017-07-16 MED ORDER — ACETAMINOPHEN 325 MG PO TABS
650.0000 mg | ORAL_TABLET | Freq: Four times a day (QID) | ORAL | Status: DC | PRN
  Administered 2017-07-16 – 2017-07-17 (×3): 650 mg via ORAL
  Filled 2017-07-16 (×3): qty 2

## 2017-07-16 MED ORDER — ALPRAZOLAM 0.5 MG PO TABS
0.5000 mg | ORAL_TABLET | Freq: Once | ORAL | Status: AC
  Administered 2017-07-16: 0.5 mg via ORAL
  Filled 2017-07-16: qty 1

## 2017-07-16 MED ORDER — ROFLUMILAST 500 MCG PO TABS
500.0000 ug | ORAL_TABLET | Freq: Every day | ORAL | Status: DC
  Administered 2017-07-17: 500 ug via ORAL
  Filled 2017-07-16: qty 1

## 2017-07-16 MED ORDER — ONDANSETRON HCL 4 MG PO TABS: 4.0000 mg | ORAL_TABLET | Freq: Four times a day (QID) | ORAL | Status: DC | PRN

## 2017-07-16 MED ORDER — SENNOSIDES-DOCUSATE SODIUM 8.6-50 MG PO TABS
1.0000 | ORAL_TABLET | Freq: Two times a day (BID) | ORAL | Status: DC
  Administered 2017-07-16 – 2017-07-17 (×3): 1 via ORAL
  Filled 2017-07-16 (×3): qty 1

## 2017-07-16 NOTE — ED Provider Notes (Signed)
[facility] EMERGENCY DEPARTMENT Provider Note   CSN: [id number] Arrival date & time: 07/16/17  1316     History   Chief Complaint Chief Complaint  Patient presents with  . Shortness of Breath    HPI [name] [name] is a 77 y.o. male.  HPI Patient has history of bladder cancer and severe COPD.  He wears 4 L of oxygen on nasal cannula.  Coming from nursing home for labs performed today revealing a hemoglobin of 6.7.  Patient denies possibility or melanotic stool.  States he has continued shortness of breath but roughly at his baseline.  He continues to have cough with some white sputum production.  Denies chest pain or abdominal pain.  Has ongoing lower extremity swelling. Past Medical History:  Diagnosis Date  . Anxiety 03/13/2012  . Asthma   . 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  Marland Kitchen BPH (benign prostatic hyperplasia) 10/01/2011   Per cystoscopy  . Chronic anxiety   . Chronic back pain   . COPD (chronic obstructive pulmonary disease) ([place])   . Depression   . Emphysema lung ([place])   . GERD (gastroesophageal reflux disease)   . Headache(784.0)   . History of stomach ulcers   . Hypercholesteremia   . Hyperlipidemia 03/13/2012  . On home O2    2L N/C continuous  . Pulmonary nodule 09/2011   Stable appearance  . Tobacco abuse 09/29/2011    Patient Active Problem List   Diagnosis Date Noted  . Pulmonary edema, acute ([place]) 07/16/2017  . Sepsis ([place]) 07/04/2017  . Hypokalemia 07/04/2017  . Leukocytosis 07/04/2017  . Urothelial carcinoma ([place]) 06/25/2017  . AKI (acute kidney injury) ([place])   . Severe anemia 04/22/2017  . Shortness of breath   . Acute on chronic respiratory failure ([place]) 10/24/2015  . Pressure ulcer 09/25/2015  . Respiratory failure ([place]) 09/24/2015  . Acute on chronic respiratory failure with hypercapnia ([place])   . Frequent falls 09/21/2015  . HLD (hyperlipidemia) 08/18/2015  . Chronic anemia 08/18/2015  . COPD exacerbation ([place])  08/18/2015  . Chronic pain syndrome 05/01/2015  . Chronic respiratory failure with hypoxia ([place])   . Acute on chronic respiratory failure with hypoxia ([place]) 02/25/2014  . COPD (chronic obstructive pulmonary disease) ([place]) 02/09/2014  . Hyperglycemia, drug-induced 03/26/2013  . Chronic back pain   . Anxiety 03/13/2012  . Hyperlipidemia 03/13/2012  . BPH (benign prostatic hyperplasia) 10/01/2011  . Tobacco abuse 09/29/2011  . Bladder tumor 09/28/2011  . PULMONARY NODULE 12/27/2009  . Essential hypertension 12/26/2009  . OSTEOARTHRITIS 12/26/2009    Past Surgical History:  Procedure Laterality Date  . CHOLECYSTECTOMY    . CYSTOSCOPY  09/30/2011   Procedure: CYSTOSCOPY FLEXIBLE;  Surgeon: Marissa Nestle, MD;  Location: [facility];  Service: Urology;  Laterality: N/A;  . FRACTURE SURGERY     R ring finger, pin placed  . TRANSURETHRAL RESECTION OF BLADDER TUMOR  10/01/2011   Procedure: TRANSURETHRAL RESECTION OF BLADDER TUMOR (TURBT);  Surgeon: Marissa Nestle, MD;  Location: [facility];  Service: Urology;  Laterality: N/A;        Home Medications    Prior to Admission medications   Medication Sig Start Date End Date Taking? Authorizing Provider  acetaminophen (TYLENOL) 325 MG tablet Take 650 mg by mouth every 6 (six) hours as needed for headache.   Yes [provider]  albuterol (PROVENTIL) (2.5 MG/3ML) 0.083% nebulizer solution Take 3 mLs (2.5 mg total) by nebulization every 4 (four) hours  as needed. For shortness of breath 11/28/15  Yes Timmothy Euler, MD  atorvastatin (LIPITOR) 20 MG tablet Take 20 mg by mouth daily.   Yes [provider]  citalopram (CELEXA) 20 MG tablet Take 20 mg by mouth daily.   Yes [provider]  cyanocobalamin 500 MCG tablet Take 500 mcg by mouth daily.    Yes [provider]  DULoxetine (CYMBALTA) 60 MG capsule Take 1 capsule (60 mg total) by mouth daily. 04/24/17  Yes Shahmehdi, Seyed A, MD  fluticasone (FLONASE) 50  MCG/ACT nasal spray Place 1 spray into both nostrils daily.   Yes [provider]  fluticasone furoate-vilanterol (BREO ELLIPTA) 200-25 MCG/INH AEPB Inhale 1 puff into the lungs daily. 04/24/17  Yes Shahmehdi, Seyed A, MD  guaiFENesin-dextromethorphan (ROBITUSSIN DM) 100-10 MG/5ML syrup Take 10 mLs by mouth every 6 (six) hours. 04/23/17  Yes Shahmehdi, Seyed A, MD  iron polysaccharides (NIFEREX) 150 MG capsule Take 150 mg by mouth daily.    Yes [provider]  loratadine (CLARITIN) 10 MG tablet Take 10 mg by mouth daily.   Yes [provider]  mirtazapine (REMERON) 15 MG tablet Take 15 mg by mouth at bedtime.   Yes [provider]  montelukast (SINGULAIR) 10 MG tablet Take 10 mg by mouth at bedtime.   Yes [provider]  Multiple Vitamin (MULTIVITAMIN WITH MINERALS) TABS tablet Take 1 tablet by mouth daily.   Yes [provider]  NITROSTAT 0.4 MG SL tablet Place 1 tablet under the tongue every 5 (five) minutes as needed for chest pain.  02/22/13  Yes [provider]  omeprazole (PRILOSEC) 20 MG capsule Take 20 mg by mouth daily. 11/11/16  Yes [provider]  oxybutynin (DITROPAN) 5 MG tablet Take 5 mg by mouth 3 (three) times daily.   Yes [provider]  oxyCODONE (OXY IR/ROXICODONE) 5 MG immediate release tablet Take 1 tablet by mouth 2 (two) times daily.   Yes [provider]  prochlorperazine (COMPAZINE) 10 MG tablet Take 10 mg by mouth as directed. Patient takes one tablet by mouth in the morning every Monday, Tuesday, Wednesday, Thursday, and Friday for nausea one hour prior to radiation treatments   Yes [provider]  roflumilast (DALIRESP) 500 MCG TABS tablet Take 1 Tablet by mouth once daily 11/11/16  Yes [provider]  senna-docusate (SENOKOT-S) 8.6-50 MG tablet Take 1 tablet by mouth 2 (two) times daily.   Yes [provider]  umeclidinium bromide (INCRUSE ELLIPTA) 62.5  MCG/INH AEPB Inhale 1 puff into the lungs daily. 01/01/16  Yes Hawks, Christy A, FNP  VENTOLIN HFA 108 (90 Base) MCG/ACT inhaler Inhale 2 puffs into the lungs every 6 (six) hours as needed for wheezing or shortness of breath. 12/14/15  Yes Timmothy Euler, MD  docusate sodium (COLACE) 100 MG capsule Take 100 mg by mouth daily as needed for mild constipation or moderate constipation.     [provider]  levofloxacin (LEVAQUIN) 750 MG tablet Take 750 mg by mouth daily.    [provider]    Family History Family History  Problem Relation Age of Onset  . Kidney failure Mother   . Emphysema Brother        was not close to family; does not truly know their medical problems.  . Emphysema Brother   . Emphysema Brother   . Cancer Neg Hx     Social History Social History   Tobacco Use  . Smoking  status: Current Every Day Smoker    Packs/day: 0.50    Years: 58.00    Pack years: 29.00    Types: Cigarettes  . Smokeless tobacco: Never Used  Substance Use Topics  . Alcohol use: No  . Drug use: No     Allergies   Metrizamide; Codeine; Iohexol; Ivp dye [iodinated diagnostic agents]; and Other   Review of Systems Review of Systems  Constitutional: Negative for chills and fever.  Respiratory: Positive for cough, shortness of breath and wheezing.   Cardiovascular: Positive for leg swelling. Negative for chest pain and palpitations.  Gastrointestinal: Negative for abdominal pain, blood in stool, diarrhea, nausea and vomiting.  Genitourinary: Negative for dysuria, flank pain and frequency.  Musculoskeletal: Negative for back pain, myalgias and neck pain.  Skin: Negative for rash and wound.  Neurological: Negative for dizziness, weakness, light-headedness and numbness.  All other systems reviewed and are negative.    Physical Exam Updated Vital Signs BP 124/66 (BP Location: Right Arm)   Pulse 99   Temp 98.5 F (36.9 C) (Oral)   Resp 18   SpO2 91%   Physical  Exam  Constitutional: He is oriented to person, place, and time. He appears well-developed.  Chronically ill-appearing  HENT:  Head: Normocephalic and atraumatic.  Mouth/Throat: Oropharynx is clear and moist.  Eyes: Pupils are equal, round, and reactive to light. EOM are normal.  Neck: Normal range of motion. Neck supple.  Cardiovascular: Normal rate and regular rhythm. Exam reveals no gallop and no friction rub.  No murmur heard. Pulmonary/Chest: Effort normal. He has wheezes.  Diffuse expiratory wheezing throughout.  Crackles in bilateral bases.  Abdominal: Soft. Bowel sounds are normal. There is no tenderness. There is no rebound and no guarding.  Musculoskeletal: Normal range of motion. He exhibits edema. He exhibits no tenderness.  3+ bilateral lower extremity pitting edema.  No calf tenderness.  Neurological: He is alert and oriented to person, place, and time.  Moves all extremities without focal deficit.  Sensation intact.  Skin: Skin is warm and dry. Capillary refill takes less than 2 seconds. No rash noted. No erythema.  Psychiatric: He has a normal mood and affect. His behavior is normal.  Nursing note and vitals reviewed.    ED Treatments / Results  Labs (all labs ordered are listed, but only abnormal results are displayed) Labs Reviewed  CBC WITH DIFFERENTIAL/PLATELET - Abnormal; Notable for the following components:      Result Value   RBC 2.66 (*)    Hemoglobin 6.1 (*)    HCT 21.0 (*)    MCH 22.9 (*)    MCHC 29.0 (*)    RDW 19.0 (*)    Platelets 455 (*)    All other components within normal limits  COMPREHENSIVE METABOLIC PANEL - Abnormal; Notable for the following components:   Sodium 134 (*)    Chloride 92 (*)    Glucose, Bld 118 (*)    Calcium 8.4 (*)    Albumin 2.6 (*)    GFR calc non Af Amer 57 (*)    All other components within normal limits  BRAIN NATRIURETIC PEPTIDE - Abnormal; Notable for the following components:   B Natriuretic Peptide 261.0 (*)     All other components within normal limits  TROPONIN I  PROTIME-INR  APTT  OCCULT BLOOD X 1 CARD TO LAB, STOOL  TYPE AND SCREEN  PREPARE RBC (CROSSMATCH)    EKG EKG Interpretation  Date/Time:  Wednesday July 16 2017 13:24:46  EDT Ventricular Rate:  100 PR Interval:    QRS Duration: 82 QT Interval:  363 QTC Calculation: 469 R Axis:   80 Text Interpretation:  Sinus tachycardia Low voltage, extremity leads Abnormal R-wave progression, early transition Confirmed by Julianne Rice [phone]) on 07/16/2017 1:37:22 PM   Radiology Dg Chest Port 1 View  Result Date: 07/16/2017 CLINICAL DATA:  Shortness of breath. Recent pneumonia. History of COPD. EXAM: PORTABLE CHEST 1 VIEW COMPARISON:  Chest radiograph July 04, 2017 FINDINGS: Similar interstitial and alveolar airspace opacities most conspicuous in RIGHT lower lobe. No pleural effusion. Stable cardiomegaly. No pneumothorax. Soft tissue planes and included osseous structures are nonsuspicious. IMPRESSION: Similar interstitial and to lesser extent alveolar airspace opacities seen with pulmonary edema and/or pneumonia. Stable cardiomegaly. Electronically Signed   By: Elon Alas M.D.   On: 07/16/2017 13:47    Procedures Procedures (including critical care time)  Medications Ordered in ED Medications  0.9 %  sodium chloride infusion (has no administration in time range)  albuterol (PROVENTIL) (2.5 MG/3ML) 0.083% nebulizer solution 5 mg (5 mg Nebulization Given 07/16/17 1414)  methylPREDNISolone sodium succinate (SOLU-MEDROL) 125 mg/2 mL injection 125 mg (125 mg Intravenous Given 07/16/17 1344)  furosemide (LASIX) injection 20 mg (20 mg Intravenous Given 07/16/17 1450)     Initial Impression / Assessment and Plan / ED Course  I have reviewed the triage vital signs and the nursing notes.  Pertinent labs & imaging results that were available during my care of the patient were reviewed by me and considered in my medical decision making  (see chart for details).     Patient's dyspnea is likely multifactorial.  Appears to be fluid overloaded.  Also anemic with hemoglobin of 6.1.  Source appears to be hematuria.  Patient also is having wheezing and questionable exacerbation of his COPD.  Will start slow transfusion with IV Lasix.  Been given IV Solu-Medrol and breathing treatment in the emergency department.  Discussed with hospitalist who will see patient in emergency department and admit.  Final Clinical Impressions(s) / ED Diagnoses   Final diagnoses:  COPD exacerbation ([place])  Symptomatic anemia  Acute pulmonary edema [facility])    ED Discharge Orders    None       Julianne Rice, MD 07/16/17 [phone]

## 2017-07-16 NOTE — ED Triage Notes (Signed)
Patient became SOB at nursing home, hemeglobin low, recent history of pneumonia.  Patient came to ED with oxygen via nasal cannula 4LPM. Received neb treatment en route.

## 2017-07-16 NOTE — ED Notes (Addendum)
Pt family come out to nurses station and inquired about taking pt to [facility]. Pt daughter informed that the services that the patient needs are available at [facility] and that we can not recommend transfer in pt's current condition. Hospitalist paged and in agreement and stated that pt would need to sign AMA form if pt wishes to be treated at [facility].   Followed up with pt and pt family, pt family agrees to keep pt at [facility] at this time. Pt and pt family aware that if still desire pt to be treated at [facility] at a later time, pt would have to sign AMA form.

## 2017-07-16 NOTE — ED Notes (Signed)
Date and time results received: 07/16/17 1411   Test: Hemolobin Critical Value: 6.1  Name of Provider Notified: Dr. Lita Mains  Orders Received? Or Actions Taken?: No new orders given.

## 2017-07-16 NOTE — ED Notes (Signed)
Patient urinated small amount into urinal.  Bladder scanned patient showing greater than 24ml.  Patient in and out cathed approximately 236ml urine containing clots, blood tinged urine.

## 2017-07-16 NOTE — H&P (Signed)
History and Physical    [name] [name] GGY:[id number] DOB: 29-Apr-1940 DOA: 07/16/2017  Referring MD/NP/PA: Julianne Rice, EDP PCP: [name], MD  Patient coming from: Skilled nursing facility  Chief Complaint: Shortness of breath, weakness  HPI: [name] [name] is a 77 y.o. male well-known to [place] for multiple frequent hospitalizations due to hematuria and COPD exacerbation.  He has a history of bladder cancer and just in the past 10 days has started radiation, also has chronic respiratory failure and is on 4-5 L of nasal cannula oxygen.  His main complaint today has been significant shortness of breath especially with exertion.  In the ED he was found to have a hemoglobin of 6.1, was also found to have lower extremity edema and signs of pulmonary edema on chest x-ray.  He has not known to have congestive heart failure.  Admission has been requested.  Past Medical/Surgical History: Past Medical History:  Diagnosis Date  . Anxiety 03/13/2012  . Asthma   . 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  Marland Kitchen BPH (benign prostatic hyperplasia) 10/01/2011   Per cystoscopy  . Chronic anxiety   . Chronic back pain   . COPD (chronic obstructive pulmonary disease) ([place])   . Depression   . Emphysema lung ([place])   . GERD (gastroesophageal reflux disease)   . Headache(784.0)   . History of stomach ulcers   . Hypercholesteremia   . Hyperlipidemia 03/13/2012  . On home O2    2L N/C continuous  . Pulmonary nodule 09/2011   Stable appearance  . Tobacco abuse 09/29/2011    Past Surgical History:  Procedure Laterality Date  . CHOLECYSTECTOMY    . CYSTOSCOPY  09/30/2011   Procedure: CYSTOSCOPY FLEXIBLE;  Surgeon: Marissa Nestle, MD;  Location: [facility];  Service: Urology;  Laterality: N/A;  . FRACTURE SURGERY     R ring finger, pin placed  . TRANSURETHRAL RESECTION OF BLADDER TUMOR  10/01/2011   Procedure: TRANSURETHRAL RESECTION OF BLADDER TUMOR (TURBT);  Surgeon: Marissa Nestle, MD;  Location: [facility];  Service: Urology;  Laterality: N/A;    Social History:  reports that he has been smoking cigarettes.  He has a 29.00 pack-year smoking history. He has never used smokeless tobacco. He reports that he does not drink alcohol or use drugs.  Allergies: Allergies  Allergen Reactions  . Metrizamide Hives  . Codeine   . Iohexol Hives     Code: HIVES, Desc: PT STATES HE BROKE OUT IN HIVES AND RASH AFTER CT NECK EARLY SEPT 2011; NO RESP PROBLEMS; NEEDS PRE-MEDS; MKS, Onset Date: [id number]   . Ivp Dye [Iodinated Diagnostic Agents] Hives  . Other     Broccoli, Cauliflower     Family History:  Family History  Problem Relation Age of Onset  . Kidney failure Mother   . Emphysema Brother        was not close to family; does not truly know their medical problems.  . Emphysema Brother   . Emphysema Brother   . Cancer Neg Hx     Prior to Admission medications   Medication Sig Start Date End Date Taking? Authorizing Provider  acetaminophen (TYLENOL) 325 MG tablet Take 650 mg by mouth every 6 (six) hours as needed for headache.   Yes [provider]  albuterol (PROVENTIL) (2.5 MG/3ML) 0.083% nebulizer solution Take 3 mLs (2.5 mg total) by nebulization every 4 (four) hours as needed. For shortness of breath 11/28/15  Yes Kenn File  L, MD  atorvastatin (LIPITOR) 20 MG tablet Take 20 mg by mouth daily.   Yes [provider]  citalopram (CELEXA) 20 MG tablet Take 20 mg by mouth daily.   Yes [provider]  cyanocobalamin 500 MCG tablet Take 500 mcg by mouth daily.    Yes [provider]  DULoxetine (CYMBALTA) 60 MG capsule Take 1 capsule (60 mg total) by mouth daily. 04/24/17  Yes Shahmehdi, Seyed A, MD  fluticasone (FLONASE) 50 MCG/ACT nasal spray Place 1 spray into both nostrils daily.   Yes [provider]  fluticasone furoate-vilanterol (BREO ELLIPTA) 200-25 MCG/INH AEPB Inhale 1 puff into the lungs daily. 04/24/17   Yes Shahmehdi, Seyed A, MD  guaiFENesin-dextromethorphan (ROBITUSSIN DM) 100-10 MG/5ML syrup Take 10 mLs by mouth every 6 (six) hours. 04/23/17  Yes Shahmehdi, Seyed A, MD  iron polysaccharides (NIFEREX) 150 MG capsule Take 150 mg by mouth daily.    Yes [provider]  loratadine (CLARITIN) 10 MG tablet Take 10 mg by mouth daily.   Yes [provider]  mirtazapine (REMERON) 15 MG tablet Take 15 mg by mouth at bedtime.   Yes [provider]  montelukast (SINGULAIR) 10 MG tablet Take 10 mg by mouth at bedtime.   Yes [provider]  Multiple Vitamin (MULTIVITAMIN WITH MINERALS) TABS tablet Take 1 tablet by mouth daily.   Yes [provider]  NITROSTAT 0.4 MG SL tablet Place 1 tablet under the tongue every 5 (five) minutes as needed for chest pain.  02/22/13  Yes [provider]  omeprazole (PRILOSEC) 20 MG capsule Take 20 mg by mouth daily. 11/11/16  Yes [provider]  oxybutynin (DITROPAN) 5 MG tablet Take 5 mg by mouth 3 (three) times daily.   Yes [provider]  oxyCODONE (OXY IR/ROXICODONE) 5 MG immediate release tablet Take 1 tablet by mouth 2 (two) times daily.   Yes [provider]  prochlorperazine (COMPAZINE) 10 MG tablet Take 10 mg by mouth as directed. Patient takes one tablet by mouth in the morning every Monday, Tuesday, Wednesday, Thursday, and Friday for nausea one hour prior to radiation treatments   Yes [provider]  roflumilast (DALIRESP) 500 MCG TABS tablet Take 1 Tablet by mouth once daily 11/11/16  Yes [provider]  senna-docusate (SENOKOT-S) 8.6-50 MG tablet Take 1 tablet by mouth 2 (two) times daily.   Yes [provider]  umeclidinium bromide (INCRUSE ELLIPTA) 62.5 MCG/INH AEPB Inhale 1 puff into the lungs daily. 01/01/16  Yes Hawks, Christy A, FNP  VENTOLIN HFA 108 (90 Base) MCG/ACT inhaler Inhale 2 puffs into the lungs every 6 (six) hours as needed for wheezing or  shortness of breath. 12/14/15  Yes Timmothy Euler, MD  docusate sodium (COLACE) 100 MG capsule Take 100 mg by mouth daily as needed for mild constipation or moderate constipation.     [provider]  levofloxacin (LEVAQUIN) 750 MG tablet Take 750 mg by mouth daily.    [provider]    Review of Systems:  Constitutional: Denies fever, chills, diaphoresis, appetite change and fatigue.  HEENT: Denies photophobia, eye pain, redness, hearing loss, ear pain, congestion, sore throat, rhinorrhea, sneezing, mouth sores, trouble swallowing, neck pain, neck stiffness and tinnitus.   Respiratory: Denies chest tightness,  and wheezing.   Cardiovascular: Denies chest pain, palpitations and leg swelling.  Gastrointestinal: Denies nausea, vomiting, abdominal pain, diarrhea, constipation, blood in stool and abdominal distention.  Genitourinary: Denies dysuria, urgency, frequency, hematuria,  flank pain and difficulty urinating.  Endocrine: Denies: hot or cold intolerance, sweats, changes in hair or nails, polyuria, polydipsia. Musculoskeletal: Denies myalgias, back pain, joint swelling, arthralgias and gait problem.  Skin: Denies pallor, rash and wound.  Neurological: Denies dizziness, seizures, syncope, numbness and headaches.  Hematological: Denies adenopathy. Easy bruising, personal or family bleeding history  Psychiatric/Behavioral: Denies suicidal ideation, mood changes, confusion, nervousness, sleep disturbance and agitation    Physical Exam: Vitals:   07/16/17 1530 07/16/17 1711 07/16/17 1726 07/16/17 1800  BP: (!) 143/71 (!) 156/76  134/72  Pulse: (!) 104 (!) 102  97  Resp: (!) 22 20  18   Temp: 98.6 F (37 C)  98.4 F (36.9 C) 98.3 F (36.8 C)  TempSrc: Oral  Oral Oral  SpO2:  97%  92%  Weight:    82.7 kg (182 lb 5.1 oz)     Constitutional: NAD, calm, comfortable, pale Eyes: PERRL, lids and conjunctivae normal ENMT: Mucous membranes are moist. Posterior pharynx  clear of any exudate or lesions.poor dentition.  Neck: normal, supple, no masses, no thyromegaly Respiratory: clear to auscultation bilaterally, no wheezing, no crackles. Normal respiratory effort. No accessory muscle use.  Cardiovascular: Tachycardic, regular rhythm, positive pedal pulses, no abdominal bruit Abdomen: no tenderness, no masses palpated. No hepatosplenomegaly. Bowel sounds positive.  Musculoskeletal: no clubbing / cyanosis. No joint deformity upper and lower extremities. Good ROM, no contractures. Normal muscle tone.  2+ pitting edema of bilateral lower extremities up to mid shins Skin: no rashes, lesions, ulcers. No induration Neurologic: CN 2-12 grossly intact. Sensation intact, DTR normal. Strength 5/5 in all 4.  Psychiatric: Normal judgment and insight. Alert and oriented x 3. Normal mood.    Labs on Admission: I have personally reviewed the following labs and imaging studies  CBC: Recent Labs  Lab 07/16/17 1333  WBC 8.2  NEUTROABS 5.5  HGB 6.1*  HCT 21.0*  MCV 78.9  PLT 314*   Basic Metabolic Panel: Recent Labs  Lab 07/16/17 1333  NA 134*  K 3.7  CL 92*  CO2 30  GLUCOSE 118*  BUN 10  CREATININE 1.20  CALCIUM 8.4*   GFR: Estimated Creatinine Clearance: 53.2 mL/min (by C-G formula based on SCr of 1.2 mg/dL). Liver Function Tests: Recent Labs  Lab 07/16/17 1333  AST 18  ALT 23  ALKPHOS 66  BILITOT 0.8  PROT 6.7  ALBUMIN 2.6*   No results for input(s): LIPASE, AMYLASE in the last 168 hours. No results for input(s): AMMONIA in the last 168 hours. Coagulation Profile: Recent Labs  Lab 07/16/17 1333  INR 1.14   Cardiac Enzymes: Recent Labs  Lab 07/16/17 1333  TROPONINI <0.03   BNP (last 3 results) No results for input(s): PROBNP in the last 8760 hours. HbA1C: No results for input(s): HGBA1C in the last 72 hours. CBG: No results for input(s): GLUCAP in the last 168 hours. Lipid Profile: No results for input(s): CHOL, HDL, LDLCALC,  TRIG, CHOLHDL, LDLDIRECT in the last 72 hours. Thyroid Function Tests: No results for input(s): TSH, T4TOTAL, FREET4, T3FREE, THYROIDAB in the last 72 hours. Anemia Panel: No results for input(s): VITAMINB12, FOLATE, FERRITIN, TIBC, IRON, RETICCTPCT in the last 72 hours. Urine analysis:    Component Value Date/Time   COLORURINE RED (A) 07/04/2017 0815   APPEARANCEUR CLOUDY (A) 07/04/2017 0815   APPEARANCEUR Turbid (A) 01/25/2016 1407   LABSPEC 1.020 07/04/2017 0815   PHURINE 7.0 07/04/2017 0815   GLUCOSEU 100 (A) 07/04/2017 0815   HGBUR LARGE (  A) 07/04/2017 0815   BILIRUBINUR MODERATE (A) 07/04/2017 0815   BILIRUBINUR Negative 01/25/2016 1407   KETONESUR TRACE (A) 07/04/2017 0815   PROTEINUR 100 (A) 07/04/2017 0815   UROBILINOGEN 0.2 05/04/2014 0956   NITRITE POSITIVE (A) 07/04/2017 0815   LEUKOCYTESUR MODERATE (A) 07/04/2017 0815   LEUKOCYTESUR 3+ (A) 01/25/2016 1407   Sepsis Labs: @LABRCNTIP (procalcitonin:4,lacticidven:4) )No results found for this or any previous visit (from the past 240 hour(s)).   Radiological Exams on Admission: Dg Chest Port 1 View  Result Date: 07/16/2017 CLINICAL DATA:  Shortness of breath. Recent pneumonia. History of COPD. EXAM: PORTABLE CHEST 1 VIEW COMPARISON:  Chest radiograph July 04, 2017 FINDINGS: Similar interstitial and alveolar airspace opacities most conspicuous in RIGHT lower lobe. No pleural effusion. Stable cardiomegaly. No pneumothorax. Soft tissue planes and included osseous structures are nonsuspicious. IMPRESSION: Similar interstitial and to lesser extent alveolar airspace opacities seen with pulmonary edema and/or pneumonia. Stable cardiomegaly. Electronically Signed   By: Elon Alas M.D.   On: 07/16/2017 13:47    EKG: Independently reviewed.  Sinus tachycardia, no acute ST-T wave changes  Assessment/Plan Principal Problem:   Acute blood loss anemia Active Problems:   Pulmonary edema, acute (HCC)   Essential hypertension    Bladder tumor   Hyperlipidemia    Dyspnea/chronic respiratory failure -Suspect this is mainly due to significant anemia although chest x-ray does show evidence of mild pulmonary edema as well. -Is on his baseline oxygen of 4-5 L  Acute blood loss anemia -Suspect due to hematuria which in turn is related to his bladder cancer. -Hemoglobin is 6.1 on admission, will be transfused 2 units of PRBCs now, will give Lasix in between units given pulmonary edema.  Pulmonary edema -Patient is not known to have congestive heart failure, will request 2D echo. -Was given 40 mg of Lasix in the ED, will give an additional 20 mg in between units, hold further Lasix dosing at this time.  Bladder tumor -With significant hematuria and what I suspect will become recurrent admissions for transfusion, will request palliative care evaluation. -Patient has had at least 3 admissions in the past 6 weeks which is indicative of poor performance status and poor long-term prognosis.  Depression -Mood appears stable, continue Cymbalta, Remeron and Celexa.  COPD -With chronic respiratory failure. -Appears compensated at present.  Hyperlipidemia -Continue statin    DVT prophylaxis: SCDs Code Status: Full code Family Communication: Sister at bedside updated on plan of care and all questions answered Disposition Plan: Anticipate discharge back to SNF pending medical stability Consults called: None Admission status: Inpatient   Time Spent: 85 minutes  Estela Isaac Bliss MD [facility] Pager [phone]  If 7PM-7AM, please contact night-coverage [email or web address] Password [facility]  07/16/2017, 6:38 PM

## 2017-07-17 ENCOUNTER — Encounter (HOSPITAL_COMMUNITY): Payer: Self-pay

## 2017-07-17 ENCOUNTER — Inpatient Hospital Stay (HOSPITAL_COMMUNITY): Payer: Medicare Other

## 2017-07-17 DIAGNOSIS — D62 Acute posthemorrhagic anemia: Secondary | ICD-10-CM | POA: Diagnosis not present

## 2017-07-17 DIAGNOSIS — Z515 Encounter for palliative care: Secondary | ICD-10-CM

## 2017-07-17 DIAGNOSIS — I340 Nonrheumatic mitral (valve) insufficiency: Secondary | ICD-10-CM

## 2017-07-17 DIAGNOSIS — Z7189 Other specified counseling: Secondary | ICD-10-CM

## 2017-07-17 DIAGNOSIS — D494 Neoplasm of unspecified behavior of bladder: Secondary | ICD-10-CM | POA: Diagnosis not present

## 2017-07-17 DIAGNOSIS — J810 Acute pulmonary edema: Secondary | ICD-10-CM | POA: Diagnosis not present

## 2017-07-17 LAB — CBC
HCT: 26.1 % — ABNORMAL LOW (ref 39.0–52.0)
Hemoglobin: 8 g/dL — ABNORMAL LOW (ref 13.0–17.0)
MCH: 24.4 pg — AB (ref 26.0–34.0)
MCHC: 30.7 g/dL (ref 30.0–36.0)
MCV: 79.6 fL (ref 78.0–100.0)
PLATELETS: 470 10*3/uL — AB (ref 150–400)
RBC: 3.28 MIL/uL — AB (ref 4.22–5.81)
RDW: 18 % — AB (ref 11.5–15.5)
WBC: 6.9 10*3/uL (ref 4.0–10.5)

## 2017-07-17 LAB — BASIC METABOLIC PANEL
Anion gap: 12 (ref 5–15)
BUN: 18 mg/dL (ref 6–20)
CALCIUM: 8.7 mg/dL — AB (ref 8.9–10.3)
CHLORIDE: 94 mmol/L — AB (ref 101–111)
CO2: 30 mmol/L (ref 22–32)
CREATININE: 1.07 mg/dL (ref 0.61–1.24)
GFR calc non Af Amer: >60 mL/min (ref >60)
GLUCOSE: 171 mg/dL — AB (ref 65–99)
Potassium: 4 mmol/L (ref 3.5–5.1)
Sodium: 136 mmol/L (ref 135–145)

## 2017-07-17 LAB — HEMOGLOBIN AND HEMATOCRIT, BLOOD
HCT: 28.4 % — ABNORMAL LOW (ref 39.0–52.0)
Hemoglobin: 8.7 g/dL — ABNORMAL LOW (ref 13.0–17.0)

## 2017-07-17 LAB — ECHOCARDIOGRAM COMPLETE
Height: 69 in
WEIGHTICAEL: 2917.1267 [oz_av]

## 2017-07-17 LAB — PREPARE RBC (CROSSMATCH)

## 2017-07-17 MED ORDER — ALPRAZOLAM 0.5 MG PO TABS
0.5000 mg | ORAL_TABLET | Freq: Once | ORAL | Status: AC
  Administered 2017-07-17: 0.5 mg via ORAL
  Filled 2017-07-17: qty 1

## 2017-07-17 MED ORDER — SODIUM CHLORIDE 0.9 % IV SOLN
Freq: Once | INTRAVENOUS | Status: AC
  Administered 2017-07-17: 10:00:00 via INTRAVENOUS

## 2017-07-17 MED ORDER — FUROSEMIDE 10 MG/ML IJ SOLN
20.0000 mg | Freq: Once | INTRAMUSCULAR | Status: AC
  Administered 2017-07-17: 20 mg via INTRAVENOUS
  Filled 2017-07-17: qty 2
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
## 2017-07-17 NOTE — Care Management Obs Status (Signed)
[place] NOTIFICATION   Patient Details  Name: [name] [name] MRN: [id number] Date of Birth: 10-06-1940   Medicare Observation Status Notification Given:  Yes(verbal signature given)    Olanda Boughner, Chauncey Reading, RN 07/17/2017, 4:39 PM

## 2017-07-17 NOTE — Clinical Social Work Note (Signed)
Patient was just recently discharged from [facility] on 07/08/17.  He is a currently a resident at [facility] for [facility].  Assessment from 07/07/17 is copied below.    Clinical Social Work Assessment  Patient Details  Name: [name] [name] MRN: [id number] Date of Birth: 04/14/1941  Date of referral:  07/07/17               Reason for consult:  Facility Placement                 Permission sought to share information with:    Permission granted to share information::                Name::                   Agency::  [facility]              Relationship::                Contact Information:     Housing/Transportation Living arrangements for the past 2 months:  [place], [place] of Information:  Facility Patient Interpreter Needed:  None Criminal Activity/Legal Involvement Pertinent to Current Situation/Hospitalization:  No - Comment as needed Significant Relationships:  Adult Children Lives with:  Facility Resident Do you feel safe going back to the place where you live?  No Need for family participation in patient care:  Yes (Comment)  Care giving concerns:  Facility resident. No concerns identified.    Social Worker assessment / plan:  Patient has been a resident at the facility for about one week. Patient had previously been at the facility earlier in the year but left due to being in copay days.  Upon going home, he could not manage living independently and returned to the facility last week.  Patient is on continuous oxygen 4L and requires assistance with ADLs.  He uses a wheelchair.  He can stand and pivot for short transfer.  Patient's daughter is supportive.   Employment status:  Retired Forensic scientist:  Medicaid In [place] PT Recommendations:  Not assessed at this time Information / Referral to community resources:     Patient/Family's Response to care: Patient is a long term resident.   Patient/Family's  Understanding of and Emotional Response to Diagnosis, Current Treatment, and Prognosis:  Patient's family has been advised of diagnosis ,treatment and prognosis.    Emotional Assessment Appearance:  Appears stated age Attitude/Demeanor/Rapport:    Affect (typically observed):  Unable to Assess Orientation:  Oriented to Self, Oriented to Place, Oriented to  Time, Oriented to Situation Alcohol / Substance use:  Not Applicable Psych involvement (Current and /or in the community):  No (Comment)  Discharge Needs  Concerns to be addressed:  Other (Comment Required(Return to [facility] ) Readmission within the last 30 days:  No Current discharge risk:  None Barriers to Discharge:  No Barriers Identified   Ihor Gully, LCSW 07/07/2017, 2:48 PM

## 2017-07-17 NOTE — Discharge Summary (Signed)
Physician Discharge Summary  [name] [name] SJG:[id number] DOB: [birth year] DOA: 07/16/2017  PCP: [name], MD  Admit date: 07/16/2017 Discharge date: 07/17/2017  Time spent: 45 minutes  Recommendations for Outpatient Follow-up:  -Will be discharged back to SNF today. -Will need to continue his radiation sessions starting 07/18/17. -Would recommend Hb check in 1 week, especially if continues to have hematuria.   Discharge Diagnoses:  Principal Problem:   Acute blood loss anemia Active Problems:   Pulmonary edema, acute (HCC)   Essential hypertension   Bladder tumor   Hyperlipidemia   Palliative care by specialist   Goals of care, counseling/discussion   DNR (do not resuscitate) discussion   Discharge Condition: Stable and improved  Filed Weights   07/16/17 1800 07/16/17 1849  Weight: 82.7 kg (182 lb 5.1 oz) 82.7 kg (182 lb 5.1 oz)    History of present illness:  [name] [name] is a 77 y.o. male well-known to [place] for multiple frequent hospitalizations due to hematuria and COPD exacerbation.  He has a history of bladder cancer and just in the past 10 days has started radiation, also has chronic respiratory failure and is on 4-5 L of nasal cannula oxygen.  His main complaint today has been significant shortness of breath especially with exertion.  In the ED he was found to have a hemoglobin of 6.1, was also found to have lower extremity edema and signs of pulmonary edema on chest x-ray.  He has not known to have congestive heart failure.  Admission has been requested.    Hospital Course:   Dyspnea/acute on chronic respiratory failure -Due to COPD with baseline oxygen requirements of 4-5 L. -Suspect acute component due to significant anemia as it is resolved.  Acute blood loss anemia -Due to hematuria which is related to his bladder cancer. -Hemoglobin was 6.1 on admission, has received 3 units of PRBCs with hemoglobin of 8.7 on discharge.  Mild pulmonary edema -Seen  on chest x-ray on admission. -Suspect related to profound anemia. -Echo without evidence of congestive heart failure. -Did receive doses of Lasix in between each unit of blood.  Bladder tumor  -We will need follow-up with his radiation specialist, did speak with him on the phone on 4/3, plan for patient to resume radiation on 4/5.  This is palliative radiation -His urologist is at [facility].  Depression -Mood appears stable, continue Cymbalta, Remeron and Celexa.  COPD -With chronic respiratory failure. -Appears compensated at present.  Hyperlipidemia -Continue statin      Procedures:  None  Consultations:  Palliative care  Discharge Instructions   Allergies as of 07/17/2017      Reactions   Metrizamide Hives   Codeine    Iohexol Hives    Code: HIVES, Desc: PT STATES HE BROKE OUT IN HIVES AND RASH AFTER CT NECK EARLY SEPT 2011; NO RESP PROBLEMS; NEEDS PRE-MEDS; MKS, Onset Date: [id number]   Ivp Dye [iodinated Diagnostic Agents] Hives   Other    Broccoli, Cauliflower       Medication List    STOP taking these medications   guaiFENesin-dextromethorphan 100-10 MG/5ML syrup Commonly known as:  ROBITUSSIN DM   levofloxacin 750 MG tablet Commonly known as:  LEVAQUIN     TAKE these medications   acetaminophen 325 MG tablet Commonly known as:  TYLENOL Take 650 mg by mouth every 6 (six) hours as needed for headache.   albuterol (2.5 MG/3ML) 0.083% nebulizer solution Commonly known as:  PROVENTIL Take 3 mLs (2.5  mg total) by nebulization every 4 (four) hours as needed. For shortness of breath   VENTOLIN HFA 108 (90 Base) MCG/ACT inhaler Generic drug:  albuterol Inhale 2 puffs into the lungs every 6 (six) hours as needed for wheezing or shortness of breath.   atorvastatin 20 MG tablet Commonly known as:  LIPITOR Take 20 mg by mouth daily.   citalopram 20 MG tablet Commonly known as:  CELEXA Take 20 mg by mouth daily.   cyanocobalamin 500 MCG tablet Take  500 mcg by mouth daily.   docusate sodium 100 MG capsule Commonly known as:  COLACE Take 100 mg by mouth daily as needed for mild constipation or moderate constipation.   DULoxetine 60 MG capsule Commonly known as:  CYMBALTA Take 1 capsule (60 mg total) by mouth daily.   fluticasone 50 MCG/ACT nasal spray Commonly known as:  FLONASE Place 1 spray into both nostrils daily.   fluticasone furoate-vilanterol 200-25 MCG/INH Aepb Commonly known as:  BREO ELLIPTA Inhale 1 puff into the lungs daily.   iron polysaccharides 150 MG capsule Commonly known as:  NIFEREX Take 150 mg by mouth daily.   loratadine 10 MG tablet Commonly known as:  CLARITIN Take 10 mg by mouth daily.   mirtazapine 15 MG tablet Commonly known as:  REMERON Take 15 mg by mouth at bedtime.   montelukast 10 MG tablet Commonly known as:  SINGULAIR Take 10 mg by mouth at bedtime.   multivitamin with minerals Tabs tablet Take 1 tablet by mouth daily.   NITROSTAT 0.4 MG SL tablet Generic drug:  nitroGLYCERIN Place 1 tablet under the tongue every 5 (five) minutes as needed for chest pain.   omeprazole 20 MG capsule Commonly known as:  PRILOSEC Take 20 mg by mouth daily.   oxybutynin 5 MG tablet Commonly known as:  DITROPAN Take 5 mg by mouth 3 (three) times daily.   oxyCODONE 5 MG immediate release tablet Commonly known as:  Oxy IR/ROXICODONE Take 1 tablet by mouth 2 (two) times daily.   prochlorperazine 10 MG tablet Commonly known as:  COMPAZINE Take 10 mg by mouth as directed. Patient takes one tablet by mouth in the morning every Monday, Tuesday, Wednesday, Thursday, and Friday for nausea one hour prior to radiation treatments   roflumilast 500 MCG Tabs tablet Commonly known as:  DALIRESP Take 1 Tablet by mouth once daily   senna-docusate 8.6-50 MG tablet Commonly known as:  Senokot-S Take 1 tablet by mouth 2 (two) times daily.   umeclidinium bromide 62.5 MCG/INH Aepb Commonly known as:   INCRUSE ELLIPTA Inhale 1 puff into the lungs daily.      Allergies  Allergen Reactions  . Metrizamide Hives  . Codeine   . Iohexol Hives     Code: HIVES, Desc: PT STATES HE BROKE OUT IN HIVES AND RASH AFTER CT NECK EARLY SEPT 2011; NO RESP PROBLEMS; NEEDS PRE-MEDS; MKS, Onset Date: [id number]   . Ivp Dye [Iodinated Diagnostic Agents] Hives  . Other     Broccoli, Cauliflower     Contact information for follow-up providers    [name], MD. Schedule an appointment as soon as possible for a visit in 2 week(s).   Specialty:  Family Medicine Contact information: [address] [place] [address] [phone]            Contact information for after-discharge care    Destination    [facility] .   Service:  Skilled Nursing Contact information:  [place] [phone]                   The results of significant diagnostics from this hospitalization (including imaging, microbiology, ancillary and laboratory) are listed below for reference.    Significant Diagnostic Studies: US Renal  Result Date: 06/22/2017 CLINICAL DATA:  Acute kidney injury. History of bladder tumor resection in 2013. EXAM: RENAL / URINARY TRACT ULTRASOUND COMPLETE COMPARISON:  CT abdomen dated 04/01/2017. FINDINGS: Right Kidney: Length: 10.8 cm. Echogenicity within normal limits. Benign cyst again noted within the midpole region, measuring 1.5 cm. No suspicious mass or hydronephrosis visualized. Left Kidney: Length: 10.6 cm. Echogenicity within normal limits. Small benign cyst again noted within the lower pole, measuring 1 cm. No suspicious mass or hydronephrosis visualized. Bladder: Bladder decompressed by Foley catheter. IMPRESSION: 1. No acute or significant findings. Both kidneys demonstrate normal cortical thickness and echogenicity. Small bilateral renal cysts, as also described on CT of 04/01/2017. 2. Bladder decompressed  by Foley catheter. Electronically Signed   By: Franki Cabot M.D.   On: 06/22/2017 12:14   Dg Chest Port 1 View  Result Date: 07/16/2017 CLINICAL DATA:  Shortness of breath. Recent pneumonia. History of COPD. EXAM: PORTABLE CHEST 1 VIEW COMPARISON:  Chest radiograph July 04, 2017 FINDINGS: Similar interstitial and alveolar airspace opacities most conspicuous in RIGHT lower lobe. No pleural effusion. Stable cardiomegaly. No pneumothorax. Soft tissue planes and included osseous structures are nonsuspicious. IMPRESSION: Similar interstitial and to lesser extent alveolar airspace opacities seen with pulmonary edema and/or pneumonia. Stable cardiomegaly. Electronically Signed   By: Elon Alas M.D.   On: 07/16/2017 13:47   Dg Chest Port 1 View  Result Date: 07/04/2017 CLINICAL DATA:  Sudden onset shortness of breath, COPD, emphysema, smoker EXAM: PORTABLE CHEST 1 VIEW COMPARISON:  06/21/2017 FINDINGS: Background COPD/emphysema noted. Mild superimposed and asymmetric diffuse interstitial opacities suggesting a component of early edema. Minor basilar atelectasis. No large effusion or pneumothorax. Trachea is midline. Normal heart size. Trachea is midline. No osseous abnormality. 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 Electronically Signed   By: Jerilynn Mages.  Shick M.D.   On: 07/04/2017 07:22   Dg Chest Port 1 View  Result Date: 06/21/2017 CLINICAL DATA:  SOB, pt on c-pap mask EXAM: PORTABLE CHEST 1 VIEW COMPARISON:  04/23/2017 FINDINGS: Cardiac silhouette is normal in size. No mediastinal or hilar masses. There are prominent bronchovascular markings which are similar to prior exams. There is a possible component of interstitial edema. No evidence of pneumonia. No pleural effusion or pneumothorax. Skeletal structures are grossly intact. IMPRESSION: 1. Prominent bronchovascular markings similar to prior exams, likely chronic. A component of interstitial edema should be  considered given the reported shortness of breath. No evidence of pneumonia. Electronically Signed   By: Lajean Manes M.D.   On: 06/21/2017 16:48    Microbiology: No results found for this or any previous visit (from the past 240 hour(s)).   Labs: Basic Metabolic Panel: Recent Labs  Lab 07/16/17 1333 07/17/17 0633  NA 134* 136  K 3.7 4.0  CL 92* 94*  CO2 30 30  GLUCOSE 118* 171*  BUN 10 18  CREATININE 1.20 1.07  CALCIUM 8.4* 8.7*   Liver Function Tests: Recent Labs  Lab 07/16/17 1333  AST 18  ALT 23  ALKPHOS 66  BILITOT 0.8  PROT 6.7  ALBUMIN 2.6*   No results for input(s): LIPASE, AMYLASE in the last 168 hours. No results  for input(s): AMMONIA in the last 168 hours. CBC: Recent Labs  Lab 07/16/17 1333 07/17/17 0633 07/17/17 1543  WBC 8.2 6.9  --   NEUTROABS 5.5  --   --   HGB 6.1* 8.0* 8.7*  HCT 21.0* 26.1* 28.4*  MCV 78.9 79.6  --   PLT 455* 470*  --    Cardiac Enzymes: Recent Labs  Lab 07/16/17 1333  TROPONINI <0.03   BNP: BNP (last 3 results) Recent Labs    06/21/17 1657 07/04/17 0725 07/16/17 1333  BNP 49.0 123.0* 261.0*    ProBNP (last 3 results) No results for input(s): PROBNP in the last 8760 hours.  CBG: No results for input(s): GLUCAP in the last 168 hours.     Signed:  Lelon Frohlich  [facility] Pager: [phone] 07/17/2017, 4:29 PM

## 2017-07-17 NOTE — Care Management CC44 (Signed)
Condition Code 44 Documentation Completed  Patient Details  Name: [name] [name] MRN: [id number] Date of Birth: [birth year]   Condition Code 44 given:  Yes Patient signature on Condition Code 44 notice:  Yes Documentation of 2 MD's agreement:  Yes Code 44 added to claim:  Yes    Nickie Deren, Chauncey Reading, RN 07/17/2017, 4:39 PM

## 2017-07-17 NOTE — Progress Notes (Signed)
Patient is to be discharged to [facility] and in stable condition. Patient and family made aware of transfer. Patient's IV and telemetry removed. Report called to [facility] Patient awaiting EMS transportation.  Celestia Khat, RN

## 2017-07-17 NOTE — NC FL2 (Signed)
[place] MEDICAID FL2 LEVEL OF CARE SCREENING TOOL     IDENTIFICATION  Patient Name: [name] [name] Birthdate: [birth year] Sex: male Admission Date (Current Location): 07/16/2017  [facility] and [place] Number:  [employer] and Address:  [place] [address], [place]      Provider Number: [id number]  Attending Physician Name and Address:  Isaac Bliss, Olam Idler*  Relative Name and Phone Number:       Current Level of Care: Hospital Recommended Level of Care: [place] Prior Approval Number:    Date Approved/Denied:   PASRR Number: [id number]  Discharge Plan: SNF    Current Diagnoses: Patient Active Problem List   Diagnosis Date Noted  . Pulmonary edema, acute ([place]) 07/16/2017  . Acute blood loss anemia 07/16/2017  . Sepsis ([place]) 07/04/2017  . Hypokalemia 07/04/2017  . Leukocytosis 07/04/2017  . Urothelial carcinoma ([place]) 06/25/2017  . AKI (acute kidney injury) ([place])   . Severe anemia 04/22/2017  . Shortness of breath   . Acute on chronic respiratory failure ([place]) 10/24/2015  . Pressure ulcer 09/25/2015  . Respiratory failure ([place]) 09/24/2015  . Acute on chronic respiratory failure with hypercapnia ([place])   . Frequent falls 09/21/2015  . HLD (hyperlipidemia) 08/18/2015  . Chronic anemia 08/18/2015  . COPD exacerbation ([place]) 08/18/2015  . Chronic pain syndrome 05/01/2015  . Chronic respiratory failure with hypoxia ([place])   . Acute on chronic respiratory failure with hypoxia ([place]) 02/25/2014  . COPD (chronic obstructive pulmonary disease) ([place]) 02/09/2014  . Hyperglycemia, drug-induced 03/26/2013  . Chronic back pain   . Anxiety 03/13/2012  . Hyperlipidemia 03/13/2012  . BPH (benign prostatic hyperplasia) 10/01/2011  . Tobacco abuse 09/29/2011  . Bladder tumor 09/28/2011  . PULMONARY NODULE 12/27/2009  . Essential hypertension 12/26/2009  . OSTEOARTHRITIS 12/26/2009    Orientation RESPIRATION BLADDER  Height & Weight     Self, Time, Situation, Place  O2(see discharge summary) Continent Weight: 182 lb 5.1 oz (82.7 kg) Height:  5\' 9"  (175.3 cm)  BEHAVIORAL SYMPTOMS/MOOD NEUROLOGICAL BOWEL NUTRITION STATUS      Continent Diet(see discharge summary)  AMBULATORY STATUS COMMUNICATION OF NEEDS Skin   Limited Assist Verbally Normal                       Personal Care Assistance Level of Assistance  Bathing, Feeding, Dressing Bathing Assistance: Limited assistance Feeding assistance: Independent Dressing Assistance: Limited assistance     Functional Limitations Info  Sight, Hearing, Speech Sight Info: Adequate Hearing Info: Adequate Speech Info: Adequate    SPECIAL CARE FACTORS FREQUENCY                       Contractures Contractures Info: Not present    Additional Factors Info  Code Status, Allergies, Psychotropic Code Status Info: Full Code Allergies Info: Metrizamide, Codeine, lohexol, ivp dye, other  Psychotropic Info: Celexa, Cymbalta, Remeron         Current Medications (07/17/2017):  This is the current hospital active medication list Current Facility-Administered Medications  Medication Dose Route Frequency Provider Last Rate Last Dose  . 0.9 %  sodium chloride infusion  250 mL Intravenous PRN Isaac Bliss, Rayford Halsted, MD      . acetaminophen (TYLENOL) tablet 650 mg  650 mg Oral Q6H PRN Isaac Bliss, Rayford Halsted, MD   650 mg at 07/17/17 0630   Or  . acetaminophen (TYLENOL) suppository 650 mg  650 mg Rectal  Q6H PRN Isaac Bliss, Rayford Halsted, MD      . albuterol (PROVENTIL) (2.5 MG/3ML) 0.083% nebulizer solution 2.5 mg  2.5 mg Nebulization Q4H PRN Isaac Bliss, Rayford Halsted, MD      . atorvastatin (LIPITOR) tablet 20 mg  20 mg Oral q1800 Isaac Bliss, Rayford Halsted, MD   20 mg at 07/16/17 2233  . citalopram (CELEXA) tablet 20 mg  20 mg Oral Daily Isaac Bliss, Rayford Halsted, MD   20 mg at 07/17/17 [phone]  . docusate sodium (COLACE) capsule 100 mg  100 mg  Oral Daily PRN Isaac Bliss, Rayford Halsted, MD   100 mg at 07/17/17 [phone]  . DULoxetine (CYMBALTA) DR capsule 60 mg  60 mg Oral QHS Isaac Bliss, Rayford Halsted, MD   60 mg at 07/16/17 2231  . fluticasone (FLONASE) 50 MCG/ACT nasal spray 1 spray  1 spray Each Nare Daily Isaac Bliss, Rayford Halsted, MD   1 spray at 07/17/17 [phone]  . fluticasone furoate-vilanterol (BREO ELLIPTA) 200-25 MCG/INH 1 puff  1 puff Inhalation Daily Isaac Bliss, Rayford Halsted, MD      . iron polysaccharides (NIFEREX) capsule 150 mg  150 mg Oral Daily Isaac Bliss, Rayford Halsted, MD   150 mg at 07/17/17 0942  . loratadine (CLARITIN) tablet 10 mg  10 mg Oral Daily Isaac Bliss, Rayford Halsted, MD   10 mg at 07/17/17 [phone]  . mirtazapine (REMERON) tablet 15 mg  15 mg Oral QHS Isaac Bliss, Rayford Halsted, MD   15 mg at 07/16/17 2231  . montelukast (SINGULAIR) tablet 10 mg  10 mg Oral QHS Isaac Bliss, Rayford Halsted, MD   10 mg at 07/16/17 2231  . multivitamin with minerals tablet 1 tablet  1 tablet Oral Daily Isaac Bliss, Rayford Halsted, MD   1 tablet at 07/17/17 [phone]  . nitroGLYCERIN (NITROSTAT) SL tablet 0.4 mg  0.4 mg Sublingual Q5 min PRN Isaac Bliss, Rayford Halsted, MD      . ondansetron [facility]) tablet 4 mg  4 mg Oral Q6H PRN Isaac Bliss, Rayford Halsted, MD       Or  . ondansetron [facility]) injection 4 mg  4 mg Intravenous Q6H PRN Isaac Bliss, Rayford Halsted, MD      . oxybutynin (DITROPAN) tablet 5 mg  5 mg Oral TID Isaac Bliss, Rayford Halsted, MD   5 mg at 07/17/17 [phone]  . pantoprazole (PROTONIX) EC tablet 40 mg  40 mg Oral QHS Isaac Bliss, Rayford Halsted, MD   40 mg at 07/16/17 2231  . roflumilast (DALIRESP) tablet 500 mcg  500 mcg Oral Daily Isaac Bliss, Rayford Halsted, MD   500 mcg at 07/17/17 [phone]  . senna-docusate (Senokot-S) tablet 1 tablet  1 tablet Oral BID Isaac Bliss, Rayford Halsted, MD   1 tablet at 07/17/17 [phone]  . sodium chloride flush (NS) 0.9 % injection 3 mL  3 mL Intravenous Q12H Isaac Bliss, Rayford Halsted, MD   3 mL at  07/17/17 [phone]  . sodium chloride flush (NS) 0.9 % injection 3 mL  3 mL Intravenous PRN Isaac Bliss, Rayford Halsted, MD      . umeclidinium bromide (INCRUSE ELLIPTA) 62.5 MCG/INH 1 puff  1 puff Inhalation Daily Isaac Bliss, Rayford Halsted, MD      . vitamin B-12 (CYANOCOBALAMIN) tablet 500 mcg  500 mcg Oral Daily Isaac Bliss, Rayford Halsted, MD   500 mcg at 07/17/17 8250     Discharge Medications: Please see discharge summary for a list of discharge medications.  Relevant  Imaging Results:  Relevant Lab Results:   Additional Information SSN: [id number]  Malikah Principato, Clydene Pugh, LCSW

## 2017-07-17 NOTE — Progress Notes (Addendum)
EMS transport arrived to take pt to [facility]. No PIV in place. Related paperwork given to transporters. Per report, off-going RN already gave report to receiving facility. No needs voiced at this time. Pt stable upon departure.

## 2017-07-17 NOTE — Clinical Social Work Note (Signed)
Facility aware of discharge. RN to print and send d/c summary to facility with patient's discharge paperwork.   Daughter, Zay Yeargan, made aware of discharge.    LCSW signing off.    Tonita Bills, Clydene Pugh, LCSW

## 2017-07-17 NOTE — Consult Note (Signed)
Consultation Note Date: 07/17/2017   Patient Name: [name] [name]  DOB: May 28, 1940  MRN: [id number]  Age / Sex: 77 y.o., male  PCP: [name], MD Referring Physician: Isaac Bliss, Olam Idler*  Reason for Consultation: Establishing goals of care and Psychosocial/spiritual support  HPI/Patient Profile: 77 y.o. male  with past medical history of high-grade urothelial carcinoma since 2013, now with hematuria and 3 hospital admissions in 6 weeks, COPD continuous home O2 2 L, pulmonary nodule with stable appearance since 2013, anxiety and depression, chronic back pain, high blood pressure and cholesterol, [facility] local SNF for rehab,  admitted on 07/16/2017 with acute blood loss anemia related to hematuria/bladder cancer.   Clinical Assessment and Goals of Care: Mr. Burtch is lying quietly in bed.  Nursing staff is at bedside administering blood products.  He is alert and oriented, but seems to have limited understanding of his health concerns.  There is no family at bedside at this time.  We talked about his home life.  He shares that he has to guys who live with him.  He states that they do not really help him, but if he fell or needed help with getting to the bathroom they probably could help him.  He tells me that he worked at the [employer] before retirement, and that he lives in [place].  He is currently at [facility] for rehab.  He tells me that his daughter, [name] [name] is his surrogate decision-maker.  I share that I am with the palliative medicine team, specialized care for people with chronic illness.  He states, "there is nothing wrong with my mind".  I ask him what he means by this.  I share with him that palliative medicine is for people with chronic illness like COPD, heart failure, cancer.  He seems to understand at that point.  We talked about his cancer.  He tells me that he sees  the cancer doctor in [place], but is unable to tell me the name.  He shares that Neoma Laming makes all his appointments, and make sure he gets to them.  He tells me that he will go "next month".  And I asked him what month?  May or June, he states probably June.  I share that he is to be discharged today so that he can have palliative radiation tomorrow.  We talked about healthcare power of attorney, see below.  We talked about CODE STATUS, see below.  He agrees for me to call his daughter, [name] [name].  Call to daughter, [name] [name], at [phone].  Mailbox is full, unable to leave message.  Healthcare power of attorney NEXT OF KIN -Mr. Henard names his daughter, [name] [name] as his Ambulance person.  He shares that he also has a son [name] [name], and a daughter [name] [name].   SUMMARY OF RECOMMENDATIONS   At this point, full scope treatment. Continue CODE STATUS discussions. Continue discussions related to goals of care related to cancer.  Code Status/Advance Care Planning:  Full code -discussed concepts of "treat the treatable, but no extraordinary measures"with Mr. Calandra.  He nods yes to chest compressions, but no to intubation.  I share that if he were to be so sick to need chest compressions he would need intubation.  We talked about when God calls, do we let you go".  At this point he states yes.  He is unable to tell me if his daughter, [name] [name], would agree.  Symptom Management:   Per hospitalist, no additional needs at this time.  Palliative Prophylaxis:   Palliative radiation to bladder  Additional Recommendations (Limitations, Scope, Preferences):  Full Scope Treatment  Psycho-social/Spiritual:   Desire for further Chaplaincy support:no  Additional Recommendations: Caregiving  Support/Resources and Education on Hospice  Prognosis:   < 6 months, or less would not be surprising based on 4 hospitalizations in the last 6 months, 3 of which  have been in the last 6 weeks.  Acute blood loss from hematuria related to bladder cancer, along with other comorbid conditions including end-stage COPD.  Discharge Planning: Anticipate return to [facility] for rehab.      Primary Diagnoses: Present on Admission: . Pulmonary edema, acute ([place]) . Essential hypertension . Bladder tumor . Hyperlipidemia   I have reviewed the medical record, interviewed the patient and family, and examined the patient. The following aspects are pertinent.  Past Medical History:  Diagnosis Date  . Anxiety 03/13/2012  . Asthma   . 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  Marland Kitchen BPH (benign prostatic hyperplasia) 10/01/2011   Per cystoscopy  . Chronic anxiety   . Chronic back pain   . COPD (chronic obstructive pulmonary disease) ([place])   . Depression   . Emphysema lung ([place])   . GERD (gastroesophageal reflux disease)   . Headache(784.0)   . History of stomach ulcers   . Hypercholesteremia   . Hyperlipidemia 03/13/2012  . On home O2    2L N/C continuous  . Pulmonary nodule 09/2011   Stable appearance  . Tobacco abuse 09/29/2011   Social History   Socioeconomic History  . Marital status: Divorced    Spouse name: Not on file  . Number of children: Not on file  . Years of education: Not on file  . Highest education level: Not on file  Occupational History  . Not on file  Social Needs  . Financial resource strain: Not on file  . Food insecurity:    Worry: Not on file    Inability: Not on file  . Transportation needs:    Medical: Not on file    Non-medical: Not on file  Tobacco Use  . Smoking status: Current Every Day Smoker    Packs/day: 0.50    Years: 58.00    Pack years: 29.00    Types: Cigarettes  . Smokeless tobacco: Never Used  Substance and Sexual Activity  . Alcohol use: No  . Drug use: No  . Sexual activity: Never  Lifestyle  . Physical activity:    Days per week: Not on file    Minutes per session:  Not on file  . Stress: Not on file  Relationships  . Social connections:    Talks on phone: Not on file    Gets together: Not on file    Attends religious service: Not on file    Active member of club or organization: Not on file    Attends meetings of clubs or organizations: Not on file  Relationship status: Not on file  Other Topics Concern  . Not on file  Social History Narrative  . Not on file   Family History  Problem Relation Age of Onset  . Kidney failure Mother   . Emphysema Brother        was not close to family; does not truly know their medical problems.  . Emphysema Brother   . Emphysema Brother   . Cancer Neg Hx    Scheduled Meds: . atorvastatin  20 mg Oral q1800  . citalopram  20 mg Oral Daily  . DULoxetine  60 mg Oral QHS  . fluticasone  1 spray Each Nare Daily  . fluticasone furoate-vilanterol  1 puff Inhalation Daily  . iron polysaccharides  150 mg Oral Daily  . loratadine  10 mg Oral Daily  . mirtazapine  15 mg Oral QHS  . montelukast  10 mg Oral QHS  . multivitamin with minerals  1 tablet Oral Daily  . oxybutynin  5 mg Oral TID  . pantoprazole  40 mg Oral QHS  . roflumilast  500 mcg Oral Daily  . senna-docusate  1 tablet Oral BID  . sodium chloride flush  3 mL Intravenous Q12H  . umeclidinium bromide  1 puff Inhalation Daily  . cyanocobalamin  500 mcg Oral Daily   Continuous Infusions: . sodium chloride     PRN Meds:.sodium chloride, acetaminophen **OR** acetaminophen, albuterol, docusate sodium, nitroGLYCERIN, ondansetron **OR** ondansetron (ZOFRAN) IV, sodium chloride flush Medications Prior to Admission:  Prior to Admission medications   Medication Sig Start Date End Date Taking? Authorizing Provider  acetaminophen (TYLENOL) 325 MG tablet Take 650 mg by mouth every 6 (six) hours as needed for headache.   Yes [provider]  albuterol (PROVENTIL) (2.5 MG/3ML) 0.083% nebulizer solution Take 3 mLs (2.5 mg total) by nebulization every 4  (four) hours as needed. For shortness of breath 11/28/15  Yes Timmothy Euler, MD  atorvastatin (LIPITOR) 20 MG tablet Take 20 mg by mouth daily.   Yes [provider]  citalopram (CELEXA) 20 MG tablet Take 20 mg by mouth daily.   Yes [provider]  cyanocobalamin 500 MCG tablet Take 500 mcg by mouth daily.    Yes [provider]  DULoxetine (CYMBALTA) 60 MG capsule Take 1 capsule (60 mg total) by mouth daily. 04/24/17  Yes Shahmehdi, Seyed A, MD  fluticasone (FLONASE) 50 MCG/ACT nasal spray Place 1 spray into both nostrils daily.   Yes [provider]  fluticasone furoate-vilanterol (BREO ELLIPTA) 200-25 MCG/INH AEPB Inhale 1 puff into the lungs daily. 04/24/17  Yes Shahmehdi, Seyed A, MD  guaiFENesin-dextromethorphan (ROBITUSSIN DM) 100-10 MG/5ML syrup Take 10 mLs by mouth every 6 (six) hours. 04/23/17  Yes Shahmehdi, Seyed A, MD  iron polysaccharides (NIFEREX) 150 MG capsule Take 150 mg by mouth daily.    Yes [provider]  loratadine (CLARITIN) 10 MG tablet Take 10 mg by mouth daily.   Yes [provider]  mirtazapine (REMERON) 15 MG tablet Take 15 mg by mouth at bedtime.   Yes [provider]  montelukast (SINGULAIR) 10 MG tablet Take 10 mg by mouth at bedtime.   Yes [provider]  Multiple Vitamin (MULTIVITAMIN WITH MINERALS) TABS tablet Take 1 tablet by mouth daily.   Yes [provider]  NITROSTAT 0.4 MG SL tablet Place 1 tablet under the tongue every 5 (five) minutes as needed for chest pain.  02/22/13  Yes [provider]  omeprazole (  PRILOSEC) 20 MG capsule Take 20 mg by mouth daily. 11/11/16  Yes [provider]  oxybutynin (DITROPAN) 5 MG tablet Take 5 mg by mouth 3 (three) times daily.   Yes [provider]  oxyCODONE (OXY IR/ROXICODONE) 5 MG immediate release tablet Take 1 tablet by mouth 2 (two) times daily.   Yes [provider]  prochlorperazine (COMPAZINE) 10 MG  tablet Take 10 mg by mouth as directed. Patient takes one tablet by mouth in the morning every Monday, Tuesday, Wednesday, Thursday, and Friday for nausea one hour prior to radiation treatments   Yes [provider]  roflumilast (DALIRESP) 500 MCG TABS tablet Take 1 Tablet by mouth once daily 11/11/16  Yes [provider]  senna-docusate (SENOKOT-S) 8.6-50 MG tablet Take 1 tablet by mouth 2 (two) times daily.   Yes [provider]  umeclidinium bromide (INCRUSE ELLIPTA) 62.5 MCG/INH AEPB Inhale 1 puff into the lungs daily. 01/01/16  Yes Hawks, Christy A, FNP  VENTOLIN HFA 108 (90 Base) MCG/ACT inhaler Inhale 2 puffs into the lungs every 6 (six) hours as needed for wheezing or shortness of breath. 12/14/15  Yes Timmothy Euler, MD  docusate sodium (COLACE) 100 MG capsule Take 100 mg by mouth daily as needed for mild constipation or moderate constipation.     [provider]  levofloxacin (LEVAQUIN) 750 MG tablet Take 750 mg by mouth daily.    [provider]   Allergies  Allergen Reactions  . Metrizamide Hives  . Codeine   . Iohexol Hives     Code: HIVES, Desc: PT STATES HE BROKE OUT IN HIVES AND RASH AFTER CT NECK EARLY SEPT 2011; NO RESP PROBLEMS; NEEDS PRE-MEDS; MKS, Onset Date: [id number]   . Ivp Dye [Iodinated Diagnostic Agents] Hives  . Other     Broccoli, Cauliflower    Review of Systems  Unable to perform ROS: Mental status change    Physical Exam  Constitutional: He is oriented to person, place, and time. He appears ill.  Appears weak and frail, makes and briefly keeps eye contact, appears chronically ill.  HENT:  Head: Atraumatic.  Pulmonary/Chest: Effort normal. No respiratory distress.  Abdominal: Soft. He exhibits no distension.  Musculoskeletal:       Right lower leg: He exhibits no edema.  Neurological: He is alert and oriented to person, place, and time.  Limited understanding of his health concerns  Skin: Skin is warm and  dry.  Psychiatric: His mood appears not anxious. He is not agitated.  Nursing note and vitals reviewed.   Vital Signs: BP 129/73   Pulse 89   Temp 98 F (36.7 C) (Oral)   Resp 16   Ht 5\' 9"  (1.753 m)   Wt 82.7 kg (182 lb 5.1 oz)   SpO2 96%   BMI 26.92 kg/m  Pain Scale: 0-10 POSS *See Group Information*: S-Acceptable,Sleep, easy to arouse Pain Score: 0-No pain   SpO2: SpO2: 96 % O2 Device:SpO2: 96 % O2 Flow Rate: .O2 Flow Rate (L/min): 5 L/min  IO: Intake/output summary:   Intake/Output Summary (Last 24 hours) at 07/17/2017 1306 Last data filed at 07/17/2017 0800 Gross per 24 hour  Intake 780 ml  Output 600 ml  Net 180 ml    LBM: Last BM Date: 07/16/17 Baseline Weight: Weight: 82.7 kg (182 lb 5.1 oz) Most recent weight: Weight: 82.7 kg (182 lb 5.1 oz)     Palliative Assessment/Data:   Flowsheet Rows     Most Recent Value  Intake Tab  Referral Department  Hospitalist  Unit at Time of Referral  Cardiac/Telemetry Unit  Palliative Care Primary Diagnosis  Cancer  Date Notified  07/17/17  Palliative Care Type  Return patient Palliative Care  Reason for referral  Clarify Goals of Care  Date of Admission  07/16/17  Date first seen by Palliative Care  07/17/17  # of days Palliative referral response time  0 Day(s)  # of days IP prior to Palliative referral  1  Clinical Assessment  Palliative Performance Scale Score  40%  Pain Max last 24 hours  Not able to report  Pain Min Last 24 hours  Not able to report  Dyspnea Max Last 24 Hours  Not able to report  Dyspnea Min Last 24 hours  Not able to report  Psychosocial & Spiritual Assessment  Palliative Care Outcomes  Patient/Family meeting held?  Yes  Who was at the meeting?  Patient at bedside, daughter Neoma Laming via phone  Palliative Care Outcomes  Clarified goals of care, Provided psychosocial or spiritual support      Time In: 1140 Time Out: 1230 Time Total: 50 minutes Greater than 50%  of this time was spent  counseling and coordinating care related to the above assessment and plan.  Signed by: Drue Novel, NP   Please contact Palliative Medicine Team phone at [phone] for questions and concerns.  For individual provider: See Shea Evans

## 2017-07-17 NOTE — Progress Notes (Signed)
*  PRELIMINARY RESULTS* Echocardiogram 2D Echocardiogram has been performed.  [name] [name] 07/17/2017, 9:33 AM

## 2017-07-18 LAB — TYPE AND SCREEN
ABO/RH(D): O POS
Antibody Screen: NEGATIVE
Unit division: 0
Unit division: 0
Unit division: 0

## 2017-07-18 LAB — BPAM RBC
BLOOD PRODUCT EXPIRATION DATE: 201904092359
BLOOD PRODUCT EXPIRATION DATE: 201904092359
Blood Product Expiration Date: 201904112359
ISSUE DATE / TIME: 201904031812
ISSUE DATE / TIME: 201904041053
ISSUE DATE / TIME: 201904031533
Unit Type and Rh: 5100
Unit Type and Rh: 5100
Unit Type and Rh: 5100

## 2017-07-28 ENCOUNTER — Emergency Department (HOSPITAL_COMMUNITY): Payer: Medicare Other

## 2017-07-28 ENCOUNTER — Encounter (HOSPITAL_COMMUNITY): Payer: Self-pay | Admitting: Primary Care

## 2017-07-28 ENCOUNTER — Inpatient Hospital Stay (HOSPITAL_COMMUNITY)
Admission: EM | Admit: 2017-07-28 | Discharge: 2017-07-30 | DRG: 070 | Disposition: A | Discharge status: Hospice | Payer: Medicare Other | Attending: Internal Medicine | Admitting: Internal Medicine

## 2017-07-28 ENCOUNTER — Other Ambulatory Visit: Payer: Self-pay

## 2017-07-28 DIAGNOSIS — G9349 Other encephalopathy: Principal | ICD-10-CM | POA: Diagnosis present

## 2017-07-28 DIAGNOSIS — Z960 Presence of urogenital implants: Secondary | ICD-10-CM | POA: Diagnosis present

## 2017-07-28 DIAGNOSIS — J9621 Acute and chronic respiratory failure with hypoxia: Secondary | ICD-10-CM | POA: Diagnosis not present

## 2017-07-28 DIAGNOSIS — Z91041 Radiographic dye allergy status: Secondary | ICD-10-CM

## 2017-07-28 DIAGNOSIS — F419 Anxiety disorder, unspecified: Secondary | ICD-10-CM | POA: Diagnosis present

## 2017-07-28 DIAGNOSIS — I5032 Chronic diastolic (congestive) heart failure: Secondary | ICD-10-CM | POA: Diagnosis present

## 2017-07-28 DIAGNOSIS — Z91018 Allergy to other foods: Secondary | ICD-10-CM

## 2017-07-28 DIAGNOSIS — I081 Rheumatic disorders of both mitral and tricuspid valves: Secondary | ICD-10-CM | POA: Diagnosis present

## 2017-07-28 DIAGNOSIS — D62 Acute posthemorrhagic anemia: Secondary | ICD-10-CM | POA: Diagnosis not present

## 2017-07-28 DIAGNOSIS — G8929 Other chronic pain: Secondary | ICD-10-CM | POA: Diagnosis present

## 2017-07-28 DIAGNOSIS — C679 Malignant neoplasm of bladder, unspecified: Secondary | ICD-10-CM | POA: Diagnosis present

## 2017-07-28 DIAGNOSIS — Z885 Allergy status to narcotic agent status: Secondary | ICD-10-CM

## 2017-07-28 DIAGNOSIS — Z79899 Other long term (current) drug therapy: Secondary | ICD-10-CM

## 2017-07-28 DIAGNOSIS — R945 Abnormal results of liver function studies: Secondary | ICD-10-CM | POA: Diagnosis present

## 2017-07-28 DIAGNOSIS — N179 Acute kidney failure, unspecified: Secondary | ICD-10-CM | POA: Diagnosis present

## 2017-07-28 DIAGNOSIS — E785 Hyperlipidemia, unspecified: Secondary | ICD-10-CM | POA: Diagnosis present

## 2017-07-28 DIAGNOSIS — I248 Other forms of acute ischemic heart disease: Secondary | ICD-10-CM | POA: Diagnosis not present

## 2017-07-28 DIAGNOSIS — Z8711 Personal history of peptic ulcer disease: Secondary | ICD-10-CM

## 2017-07-28 DIAGNOSIS — Z9981 Dependence on supplemental oxygen: Secondary | ICD-10-CM

## 2017-07-28 DIAGNOSIS — J439 Emphysema, unspecified: Secondary | ICD-10-CM | POA: Diagnosis present

## 2017-07-28 DIAGNOSIS — Z7951 Long term (current) use of inhaled steroids: Secondary | ICD-10-CM

## 2017-07-28 DIAGNOSIS — Z515 Encounter for palliative care: Secondary | ICD-10-CM | POA: Diagnosis not present

## 2017-07-28 DIAGNOSIS — N390 Urinary tract infection, site not specified: Secondary | ICD-10-CM | POA: Diagnosis present

## 2017-07-28 DIAGNOSIS — Z906 Acquired absence of other parts of urinary tract: Secondary | ICD-10-CM | POA: Diagnosis not present

## 2017-07-28 DIAGNOSIS — E860 Dehydration: Secondary | ICD-10-CM | POA: Diagnosis not present

## 2017-07-28 DIAGNOSIS — R0902 Hypoxemia: Secondary | ICD-10-CM

## 2017-07-28 DIAGNOSIS — R464 Slowness and poor responsiveness: Secondary | ICD-10-CM | POA: Diagnosis present

## 2017-07-28 DIAGNOSIS — Z841 Family history of disorders of kidney and ureter: Secondary | ICD-10-CM

## 2017-07-28 DIAGNOSIS — E876 Hypokalemia: Secondary | ICD-10-CM | POA: Diagnosis present

## 2017-07-28 DIAGNOSIS — Z7189 Other specified counseling: Secondary | ICD-10-CM | POA: Diagnosis not present

## 2017-07-28 DIAGNOSIS — Z66 Do not resuscitate: Secondary | ICD-10-CM | POA: Diagnosis not present

## 2017-07-28 DIAGNOSIS — F17210 Nicotine dependence, cigarettes, uncomplicated: Secondary | ICD-10-CM | POA: Diagnosis present

## 2017-07-28 DIAGNOSIS — I110 Hypertensive heart disease with heart failure: Secondary | ICD-10-CM | POA: Diagnosis not present

## 2017-07-28 DIAGNOSIS — R740 Nonspecific elevation of levels of transaminase and lactic acid dehydrogenase [LDH]: Secondary | ICD-10-CM | POA: Diagnosis present

## 2017-07-28 DIAGNOSIS — J449 Chronic obstructive pulmonary disease, unspecified: Secondary | ICD-10-CM | POA: Diagnosis present

## 2017-07-28 DIAGNOSIS — D638 Anemia in other chronic diseases classified elsewhere: Secondary | ICD-10-CM | POA: Diagnosis not present

## 2017-07-28 DIAGNOSIS — R748 Abnormal levels of other serum enzymes: Secondary | ICD-10-CM | POA: Diagnosis present

## 2017-07-28 DIAGNOSIS — Z923 Personal history of irradiation: Secondary | ICD-10-CM

## 2017-07-28 DIAGNOSIS — K219 Gastro-esophageal reflux disease without esophagitis: Secondary | ICD-10-CM | POA: Diagnosis present

## 2017-07-28 DIAGNOSIS — R000 Tachycardia, unspecified: Secondary | ICD-10-CM | POA: Diagnosis present

## 2017-07-28 DIAGNOSIS — G9340 Encephalopathy, unspecified: Secondary | ICD-10-CM

## 2017-07-28 DIAGNOSIS — Z9049 Acquired absence of other specified parts of digestive tract: Secondary | ICD-10-CM

## 2017-07-28 DIAGNOSIS — R319 Hematuria, unspecified: Secondary | ICD-10-CM | POA: Diagnosis present

## 2017-07-28 DIAGNOSIS — F329 Major depressive disorder, single episode, unspecified: Secondary | ICD-10-CM | POA: Diagnosis present

## 2017-07-28 DIAGNOSIS — R7401 Elevation of levels of liver transaminase levels: Secondary | ICD-10-CM

## 2017-07-28 DIAGNOSIS — Z87440 Personal history of urinary (tract) infections: Secondary | ICD-10-CM

## 2017-07-28 DIAGNOSIS — D494 Neoplasm of unspecified behavior of bladder: Secondary | ICD-10-CM | POA: Diagnosis not present

## 2017-07-28 DIAGNOSIS — R1011 Right upper quadrant pain: Secondary | ICD-10-CM | POA: Diagnosis present

## 2017-07-28 DIAGNOSIS — Z825 Family history of asthma and other chronic lower respiratory diseases: Secondary | ICD-10-CM

## 2017-07-28 DIAGNOSIS — I10 Essential (primary) hypertension: Secondary | ICD-10-CM | POA: Diagnosis present

## 2017-07-28 LAB — CBC WITH DIFFERENTIAL/PLATELET
Basophils Absolute: 0 10*3/uL (ref 0.0–0.1)
Basophils Relative: 0 %
EOS ABS: 0 10*3/uL (ref 0.0–0.7)
EOS PCT: 0 %
HCT: 33.7 % — ABNORMAL LOW (ref 39.0–52.0)
HEMOGLOBIN: 9.8 g/dL — AB (ref 13.0–17.0)
LYMPHS ABS: 1.1 10*3/uL (ref 0.7–4.0)
Lymphocytes Relative: 7 %
MCH: 24.3 pg — AB (ref 26.0–34.0)
MCHC: 29.1 g/dL — ABNORMAL LOW (ref 30.0–36.0)
MCV: 83.6 fL (ref 78.0–100.0)
MONOS PCT: 7 %
Monocytes Absolute: 1.1 10*3/uL — ABNORMAL HIGH (ref 0.1–1.0)
Neutro Abs: 14.9 10*3/uL — ABNORMAL HIGH (ref 1.7–7.7)
Neutrophils Relative %: 86 %
PLATELETS: 449 10*3/uL — AB (ref 150–400)
RBC: 4.03 MIL/uL — AB (ref 4.22–5.81)
RDW: 19.1 % — ABNORMAL HIGH (ref 11.5–15.5)
WBC: 17.1 10*3/uL — AB (ref 4.0–10.5)

## 2017-07-28 LAB — URINALYSIS, ROUTINE W REFLEX MICROSCOPIC
BILIRUBIN URINE: NEGATIVE
Glucose, UA: NEGATIVE mg/dL
KETONES UR: NEGATIVE mg/dL
Nitrite: NEGATIVE
PROTEIN: 100 mg/dL — AB
Specific Gravity, Urine: 1.009 (ref 1.005–1.030)
pH: 6 (ref 5.0–8.0)

## 2017-07-28 LAB — COMPREHENSIVE METABOLIC PANEL
ALBUMIN: 2.5 g/dL — AB (ref 3.5–5.0)
ALT: 825 U/L — AB (ref 17–63)
AST: 945 U/L — AB (ref 15–41)
Alkaline Phosphatase: 65 U/L (ref 38–126)
Anion gap: 14 (ref 5–15)
BUN: 17 mg/dL (ref 6–20)
CHLORIDE: 95 mmol/L — AB (ref 101–111)
CO2: 31 mmol/L (ref 22–32)
CREATININE: 1.55 mg/dL — AB (ref 0.61–1.24)
Calcium: 8.3 mg/dL — ABNORMAL LOW (ref 8.9–10.3)
GFR calc Af Amer: 49 mL/min — ABNORMAL LOW (ref >60)
GFR, EST NON AFRICAN AMERICAN: 42 mL/min — AB (ref >60)
Glucose, Bld: 143 mg/dL — ABNORMAL HIGH (ref 65–99)
POTASSIUM: 4.2 mmol/L (ref 3.5–5.1)
SODIUM: 140 mmol/L (ref 135–145)
Total Bilirubin: 0.8 mg/dL (ref 0.3–1.2)
Total Protein: 6.8 g/dL (ref 6.5–8.1)

## 2017-07-28 LAB — TSH: TSH: 0.299 u[IU]/mL — AB (ref 0.350–4.500)

## 2017-07-28 LAB — TROPONIN I
TROPONIN I: 0.29 ng/mL — AB (ref <0.03)
TROPONIN I: 0.71 ng/mL — AB (ref <0.03)
TROPONIN I: 0.69 ng/mL — AB (ref <0.03)

## 2017-07-28 LAB — BRAIN NATRIURETIC PEPTIDE: B NATRIURETIC PEPTIDE 5: 162 pg/mL — AB (ref 0.0–100.0)

## 2017-07-28 LAB — CBG MONITORING, ED: GLUCOSE-CAPILLARY: 140 mg/dL — AB (ref 65–99)

## 2017-07-28 LAB — LACTIC ACID, PLASMA: LACTIC ACID, VENOUS: 1.3 mmol/L (ref 0.5–1.9)

## 2017-07-28 LAB — AMMONIA: Ammonia: 14 umol/L (ref 9–35)

## 2017-07-28 MED ORDER — ONDANSETRON HCL 4 MG/2ML IJ SOLN: 4.0000 mg | Freq: Four times a day (QID) | INTRAMUSCULAR | Status: DC | PRN

## 2017-07-28 MED ORDER — ONDANSETRON HCL 4 MG PO TABS: 4.0000 mg | ORAL_TABLET | Freq: Four times a day (QID) | ORAL | Status: DC | PRN

## 2017-07-28 MED ORDER — HYDRALAZINE HCL 20 MG/ML IJ SOLN: 10.0000 mg | INTRAMUSCULAR | Status: DC | PRN

## 2017-07-28 MED ORDER — SODIUM CHLORIDE 0.9 % IV BOLUS: 1000.0000 mL | Freq: Once | INTRAVENOUS | Status: DC

## 2017-07-28 MED ORDER — ENSURE ENLIVE PO LIQD
237.0000 mL | Freq: Two times a day (BID) | ORAL | Status: DC
  Administered 2017-07-29 – 2017-07-30 (×3): 237 mL via ORAL

## 2017-07-28 MED ORDER — SODIUM CHLORIDE 0.9 % IV SOLN
INTRAVENOUS | Status: AC
  Administered 2017-07-28: 13:00:00 via INTRAVENOUS

## 2017-07-28 MED ORDER — FLUTICASONE PROPIONATE 50 MCG/ACT NA SUSP
1.0000 | Freq: Every day | NASAL | Status: DC
  Administered 2017-07-29 – 2017-07-30 (×2): 1 via NASAL
  Filled 2017-07-28: qty 16

## 2017-07-28 MED ORDER — SODIUM CHLORIDE 0.9 % IV BOLUS
500.0000 mL | Freq: Once | INTRAVENOUS | Status: AC
  Administered 2017-07-28: 500 mL via INTRAVENOUS

## 2017-07-28 MED ORDER — CEFTRIAXONE SODIUM 1 G IJ SOLR
1.0000 g | Freq: Once | INTRAMUSCULAR | Status: AC
  Administered 2017-07-28: 1 g via INTRAVENOUS
  Filled 2017-07-28: qty 10

## 2017-07-28 MED ORDER — SODIUM CHLORIDE 0.9 % IV SOLN
1.0000 g | INTRAVENOUS | Status: DC
  Administered 2017-07-29: 1 g via INTRAVENOUS
  Filled 2017-07-28: qty 1
  Filled 2017-07-28 (×2): qty 10

## 2017-07-28 NOTE — Progress Notes (Signed)
Critical lab result: Troponin 0.71. Paged Dr. [name] and made aware.

## 2017-07-28 NOTE — ED Triage Notes (Signed)
Staff went in to check on patient to do his breathing treatment this am found in bed unresponsive. 2mg  narcan given per ems. Pt became more arousable.

## 2017-07-28 NOTE — ED Provider Notes (Signed)
Emergency Department Provider Note   I have reviewed the triage vital signs and the nursing notes.   HISTORY  Chief Complaint Altered Mental Status   HPI [name] [name] is a 77 y.o. male with PMH of COPD, chronic respiratory failure on 4L [place] baseline, GERD, HLD, HNT, bladder cancer, dCHF department by EMS after being found by staff unresponsive.  He was last seen normal yesterday evening.  EMS obtained a normal blood sugar in route and started the patient on nonrebreather with 80% oxygen saturation on 4 L nasal cannula initially.  Staff noted a foul-smelling urine and note frequent hospitalizations in the recent past.  EMS gave Narcan on scene with some slight improvement in alertness.   Level 5 caveat: AMS  Past Medical History:  Diagnosis Date  . Anxiety 03/13/2012  . Asthma   . 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  Marland Kitchen BPH (benign prostatic hyperplasia) 10/01/2011   Per cystoscopy  . Chronic anxiety   . Chronic back pain   . COPD (chronic obstructive pulmonary disease) ([place])   . Depression   . Emphysema lung ([place])   . GERD (gastroesophageal reflux disease)   . Headache(784.0)   . History of stomach ulcers   . Hypercholesteremia   . Hyperlipidemia 03/13/2012  . On home O2    2L N/C continuous  . Pulmonary nodule 09/2011   Stable appearance  . Tobacco abuse 09/29/2011    Patient Active Problem List   Diagnosis Date Noted  . Encephalopathy acute 07/28/2017  . Palliative care by specialist   . Goals of care, counseling/discussion   . DNR (do not resuscitate) discussion   . Pulmonary edema, acute ([place]) 07/16/2017  . Acute blood loss anemia 07/16/2017  . Sepsis ([place]) 07/04/2017  . Hypokalemia 07/04/2017  . Leukocytosis 07/04/2017  . Urothelial carcinoma ([place]) 06/25/2017  . AKI (acute kidney injury) ([place])   . Severe anemia 04/22/2017  . Shortness of breath   . Acute on chronic respiratory failure ([place]) 10/24/2015  . Pressure ulcer 09/25/2015    . Respiratory failure ([place]) 09/24/2015  . Acute on chronic respiratory failure with hypercapnia ([place])   . Frequent falls 09/21/2015  . HLD (hyperlipidemia) 08/18/2015  . Chronic anemia 08/18/2015  . COPD exacerbation ([place]) 08/18/2015  . Chronic pain syndrome 05/01/2015  . Chronic respiratory failure with hypoxia ([place])   . Acute on chronic respiratory failure with hypoxia ([place]) 02/25/2014  . COPD (chronic obstructive pulmonary disease) ([place]) 02/09/2014  . Hyperglycemia, drug-induced 03/26/2013  . Chronic back pain   . Anxiety 03/13/2012  . Hyperlipidemia 03/13/2012  . BPH (benign prostatic hyperplasia) 10/01/2011  . Tobacco abuse 09/29/2011  . Bladder tumor 09/28/2011  . PULMONARY NODULE 12/27/2009  . Essential hypertension 12/26/2009  . OSTEOARTHRITIS 12/26/2009    Past Surgical History:  Procedure Laterality Date  . CHOLECYSTECTOMY    . CYSTOSCOPY  09/30/2011   Procedure: CYSTOSCOPY FLEXIBLE;  Surgeon: Marissa Nestle, MD;  Location: [facility];  Service: Urology;  Laterality: N/A;  . FRACTURE SURGERY     R ring finger, pin placed  . TRANSURETHRAL RESECTION OF BLADDER TUMOR  10/01/2011   Procedure: TRANSURETHRAL RESECTION OF BLADDER TUMOR (TURBT);  Surgeon: Marissa Nestle, MD;  Location: [facility];  Service: Urology;  Laterality: N/A;      Allergies Metrizamide; Codeine; Iohexol; Ivp dye [iodinated diagnostic agents]; and Other  Family History  Problem Relation Age of Onset  . Kidney failure Mother   . Emphysema Brother  was not close to family; does not truly know their medical problems.  . Emphysema Brother   . Emphysema Brother   . Cancer Neg Hx     Social History Social History   Tobacco Use  . Smoking status: Current Every Day Smoker    Packs/day: 0.50    Years: 58.00    Pack years: 29.00    Types: Cigarettes  . Smokeless tobacco: Never Used  Substance Use Topics  . Alcohol use: No  . Drug use: No    Review of Systems  Level 5 caveat:  AMS  ____________________________________________   PHYSICAL EXAM:  VITAL SIGNS: Vitals:   07/28/17 1700 07/28/17 1800  BP: 140/87 (!) 157/91  Pulse: 85 84  Resp: (!) 22 (!) 25  Temp:    SpO2: 96% 97%     Constitutional: Drowsy but groans to pain. Will verbalize to try and tell me his name but not clearly spoken.  Eyes: Conjunctivae are normal. PERRL.  Head: Atraumatic. Nose: No congestion/rhinnorhea. Mouth/Throat: Mucous membranes are moist. Neck: No stridor.  Cardiovascular: Tachycardia. Good peripheral circulation. Grossly normal heart sounds.   Respiratory: Increased respiratory effort.  No retractions. Lungs CTAB. Gastrointestinal: Soft and nontender. No distention.  Musculoskeletal: No lower extremity tenderness nor edema. No gross deformities of extremities. Neurologic:  No gross focal neurologic deficits are appreciated.  Skin:  Skin is warm, dry and intact. No rash noted.  ____________________________________________   LABS (all labs ordered are listed, but only abnormal results are displayed)  Labs Reviewed  COMPREHENSIVE METABOLIC PANEL - Abnormal; Notable for the following components:      Result Value   Chloride 95 (*)    Glucose, Bld 143 (*)    Creatinine, Ser 1.55 (*)    Calcium 8.3 (*)    Albumin 2.5 (*)    AST 945 (*)    ALT 825 (*)    GFR calc non Af Amer 42 (*)    GFR calc Af Amer 49 (*)    All other components within normal limits  CBC WITH DIFFERENTIAL/PLATELET - Abnormal; Notable for the following components:   WBC 17.1 (*)    RBC 4.03 (*)    Hemoglobin 9.8 (*)    HCT 33.7 (*)    MCH 24.3 (*)    MCHC 29.1 (*)    RDW 19.1 (*)    Platelets 449 (*)    Neutro Abs 14.9 (*)    Monocytes Absolute 1.1 (*)    All other components within normal limits  URINALYSIS, ROUTINE W REFLEX MICROSCOPIC - Abnormal; Notable for the following components:   APPearance CLOUDY (*)    Hgb urine dipstick LARGE (*)    Protein, ur 100 (*)    Leukocytes, UA  MODERATE (*)    Bacteria, UA FEW (*)    Squamous Epithelial / LPF 0-5 (*)    All other components within normal limits  TROPONIN I - Abnormal; Notable for the following components:   Troponin I 0.29 (*)    All other components within normal limits  BRAIN NATRIURETIC PEPTIDE - Abnormal; Notable for the following components:   B Natriuretic Peptide 162.0 (*)    All other components within normal limits  TROPONIN I - Abnormal; Notable for the following components:   Troponin I 0.71 (*)    All other components within normal limits  TSH - Abnormal; Notable for the following components:   TSH 0.299 (*)    All other components within normal limits  CBG  MONITORING, ED - Abnormal; Notable for the following components:   Glucose-Capillary 140 (*)    All other components within normal limits  CULTURE, BLOOD (ROUTINE X 2)  CULTURE, BLOOD (ROUTINE X 2)  URINE CULTURE  URINE CULTURE  LACTIC ACID, PLASMA  AMMONIA  HEPATITIS PANEL, ACUTE  TROPONIN I  TROPONIN I  COMPREHENSIVE METABOLIC PANEL  CBC   ____________________________________________  EKG   EKG Interpretation  Date/Time:  Monday July 28 2017 07:33:45 EDT Ventricular Rate:  106 PR Interval:    QRS Duration: 78 QT Interval:  377 QTC Calculation: 501 R Axis:   90 Text Interpretation:  Sinus tachycardia Borderline right axis deviation Abnormal R-wave progression, early transition Prolonged QT interval No STEMI.  Confirmed by Nanda Quinton [phone]) on 07/28/2017 7:52:53 AM       ____________________________________________  RADIOLOGY  Ct Head Wo Contrast  Result Date: 07/28/2017 CLINICAL DATA:  Found unresponsive EXAM: CT HEAD WITHOUT CONTRAST TECHNIQUE: Contiguous axial images were obtained from the base of the skull through the vertex without intravenous contrast. COMPARISON:  09/24/2015 FINDINGS: Brain: There is atrophy and chronic small vessel disease changes. No acute intracranial abnormality. Specifically, no  hemorrhage, hydrocephalus, mass lesion, acute infarction, or significant intracranial injury. Vascular: No hyperdense vessel or unexpected calcification. Skull: No acute calvarial abnormality. Sinuses/Orbits: Visualized paranasal sinuses and mastoids clear. Orbital soft tissues unremarkable. Other: None IMPRESSION: No acute intracranial abnormality. Atrophy, chronic microvascular disease. Electronically Signed   By: Rolm Baptise M.D.   On: 07/28/2017 09:08   Dg Chest Port 1 View  Result Date: 07/28/2017 CLINICAL DATA:  Altered mental status.  Shortness of breath. EXAM: PORTABLE CHEST 1 VIEW COMPARISON:  Radiograph of July 16, 2017. FINDINGS: Stable cardiomegaly with mild central pulmonary vascular congestion. No pneumothorax or pleural effusion is noted. Mild bibasilar subsegmental atelectasis or infiltrates is noted. Bony thorax is unremarkable. IMPRESSION: Stable bibasilar subsegmental atelectasis or infiltrates. Stable cardiomegaly with central pulmonary vascular congestion. Electronically Signed   By: Marijo Conception, M.D.   On: 07/28/2017 08:02   US Abdomen Limited Ruq  Result Date: 07/28/2017 CLINICAL DATA:  Right upper quadrant pain EXAM: ULTRASOUND ABDOMEN LIMITED RIGHT UPPER QUADRANT COMPARISON:  CT abdomen and pelvis April 23, 2017 FINDINGS: Gallbladder: No gallstones or wall thickening visualized. There is no pericholecystic fluid. No sonographic Murphy sign noted by sonographer. Common bile duct: Diameter: 3 mm. No intrahepatic or extrahepatic biliary duct dilatation. Liver: No focal lesion identified. Within normal limits in parenchymal echogenicity. Portal vein is patent on color Doppler imaging with normal direction of blood flow towards the liver. IMPRESSION: Study within normal limits. Electronically Signed   By: Lowella Grip III M.D.   On: 07/28/2017 11:13    ____________________________________________   PROCEDURES  Procedure(s) performed:   .Critical Care Performed by:  Margette Fast, MD Authorized by: Margette Fast, MD   Critical care provider statement:    Critical care time (minutes):  35   Critical care time was exclusive of:  Separately billable procedures and treating other patients and teaching time   Critical care was necessary to treat or prevent imminent or life-threatening deterioration of the following conditions:  Respiratory failure, CNS failure or compromise and dehydration   Critical care was time spent personally by me on the following activities:  Blood draw for specimens, development of treatment plan with patient or surrogate, discussions with consultants, evaluation of patient's response to treatment, examination of patient, obtaining history from patient or surrogate, ordering and performing treatments  and interventions, ordering and review of laboratory studies, ordering and review of radiographic studies, pulse oximetry, re-evaluation of patient's condition and review of old charts   I assumed direction of critical care for this patient from another provider in my specialty: no      ____________________________________________   INITIAL IMPRESSION / ASSESSMENT AND PLAN / ED COURSE  Pertinent labs & imaging results that were available during my care of the patient were reviewed by me and considered in my medical decision making (see chart for details).  She presents to the emergency department for evaluation of altered mental status.  The patient was recently admitted for volume overload and acute blood loss anemia with hemoglobin of 6 thought to be from hematuria secondary to ongoing bladder cancer.  In review the patient's CODE STATUS there have been recent to DNR discussions but the patient is currently listed as a full code and EMS support that this is accurate.  The patient is groaning to painful stimulus and attempting to speak at times.  He does appear to be protecting his airway at this time I do not feel he requires intubation.   Initially, suspecting possible sepsis but will obtain CT imaging of the head to rule out traumatic injury.   09:32 AM Labs reviewed.  The patient has significant elevated liver enzymes with normal bilirubin.  Patient also with a leukocytosis and acute kidney injury.  I have added on pneumonia and acute hepatitis panel along with right upper quadrant ultrasound.  Patient with no obvious tenderness to palpation of the abdomen but his mental status makes the exam more complicated.  For now continue IV fluids.  Doubt sepsis with normal lactate and patient being afebrile.  No source of infection identified on labs and imaging at this time.  CT imaging of the head shows no acute findings.  US abdomen reviewed. Plan for coverage for possible UTI and admit for further encephalopathic w/u.   Discussed patient's case with Hospitlaist to request admission. Patient and family (if present) updated with plan. Care transferred to Hospitalist service.  I reviewed all nursing notes, vitals, pertinent old records, EKGs, labs, imaging (as available).  ____________________________________________  FINAL CLINICAL IMPRESSION(S) / ED DIAGNOSES  Final diagnoses:  RUQ abdominal pain  Encephalopathy acute  Transaminitis  Hypoxemia     MEDICATIONS GIVEN DURING THIS VISIT:  Medications  fluticasone (FLONASE) 50 MCG/ACT nasal spray 1 spray (1 spray Each Nare Not Given 07/28/17 1610)  0.9 %  sodium chloride infusion ( Intravenous New Bag/Given 07/28/17 1253)  ondansetron (ZOFRAN) tablet 4 mg (has no administration in time range)    Or  ondansetron (ZOFRAN) injection 4 mg (has no administration in time range)  hydrALAZINE (APRESOLINE) injection 10 mg (has no administration in time range)  cefTRIAXone (ROCEPHIN) 1 g in sodium chloride 0.9 % 100 mL IVPB (has no administration in time range)  feeding supplement (ENSURE ENLIVE) (ENSURE ENLIVE) liquid 237 mL (237 mLs Oral Not Given 07/28/17 1611)  sodium chloride 0.9 %  bolus 500 mL (0 mLs Intravenous Stopped 07/28/17 0905)  cefTRIAXone (ROCEPHIN) 1 g in sodium chloride 0.9 % 100 mL IVPB (0 g Intravenous Stopped 07/28/17 1221)    Note:  This document was prepared using Dragon voice recognition software and may include unintentional dictation errors.  Nanda Quinton, MD Emergency Medicine    Mcdaniel Ohms, [facility], MD 07/28/17 [phone]

## 2017-07-28 NOTE — Progress Notes (Signed)
Trop 0.69 value has improved over previous one.

## 2017-07-28 NOTE — ED Notes (Signed)
Pt was pulling at foley catheter.  Small amt of blood noted at meatus.

## 2017-07-28 NOTE — Progress Notes (Signed)
Pharmacy Antibiotic Note  [name] [name] is a 77 y.o. male admitted on 07/28/2017 with UTI.  Pharmacy has been consulted for ceftriaxone dosing.  Plan: Ceftriaxone 1000 mg IV q24 hours  Monitor labs, c/s, and patient improvement    Temp (24hrs), Avg:98 F (36.7 C), Min:98 F (36.7 C), Max:98 F (36.7 C)  Recent Labs  Lab 07/28/17 0835  WBC 17.1*  CREATININE 1.55*  LATICACIDVEN 1.3    Estimated Creatinine Clearance: 41.2 mL/min (A) (by C-G formula based on SCr of 1.55 mg/dL (H)).    Allergies  Allergen Reactions  . Metrizamide Hives  . Codeine   . Iohexol Hives     Code: HIVES, Desc: PT STATES HE BROKE OUT IN HIVES AND RASH AFTER CT NECK EARLY SEPT 2011; NO RESP PROBLEMS; NEEDS PRE-MEDS; MKS, Onset Date: [id number]   . Ivp Dye [Iodinated Diagnostic Agents] Hives  . Other     Broccoli, Cauliflower     Antimicrobials this admission: Ceftriaxone 4/15 >>   Dose adjustments this admission: N/A  Microbiology results: 4/15 BCx: pending 4/15 UCx: pending   Thank you for allowing pharmacy to be a part of this patient's care.  Margot Ables, PharmD Clinical Pharmacist 07/28/2017 1:04 PM

## 2017-07-28 NOTE — ED Notes (Signed)
CRITICAL VALUE ALERT  Critical Value:  Troponin 0.29  Date & Time Notied: 07/28/17 0919  Provider Notified: Dr Laverta Baltimore Orders Received/Actions taken: no new orders

## 2017-07-28 NOTE — Progress Notes (Signed)
Daily Progress Note   Patient Name: [name] [name]       Date: 07/28/2017 DOB: [birth year]  Age: 77 y.o. MRN#: [id number] Attending Physician: Rodena Goldmann, DO Primary Care Physician: [name], MD Admit Date: 07/28/2017  Reason for Consultation/Follow-up: Establishing goals of care and Psychosocial/spiritual support  Subjective: [name] [name] is lying quietly in bed.  He does not open his eyes or or respond when I touch him or call his name.  Present today at bedside is daughter, [name] [name] and sister, [name] [name].  We talked about [name] [name] chronic health history in detail.  [name] [name] states her father is "been through a lot".  She states that he has been unresponsive since yesterday when she visited him in the skilled nursing facility.  [name] [name] shares that she would like her father moved from [place] to [facility].  I share that we will continue to talk about this tomorrow.  We talked about 24-48 hours for outcomes.  I share that at this point I am very worried about [name] [name] and what is next for him.  I share diagram of the chronic illness pathway, what is normal and expected.  [name] [name] has some difficulty in staying focused during our discussion.  She states that he has an appointment with urologist in [place] on 4/24.  We talked about the difficulty in [name] [name] traveling to [place].  His sister states that she feels this is unnecessary, anything that he needs, can be done in the hospital.  I share that I agree.  I share my worry for [name] [name], that he may not experience a full recovery.  We talked about how we can continue to care for him.  We talked about healthcare power of attorney.  [name] [name] states that she is the power of attorney.  I asked her to please bring  paperwork.  [name] [name] asks twice if she can add [name] [name] sister, [name] [name] to his power of attorney.  I share that only [name] [name] can change his healthcare power of attorney.  [name] [name] states that [name] [name] has a son [name] [name] and 3 daughters [name] [name], and [name] [name] in addition to herself.  We talked about CODE STATUS.  Daughter [name] [name] and [name] [name] agree to "treat the treatable, but no CPR, no intubation".   Booklet "  Gone from my sight" left with daughter and sister.  I encouraged him to read this book before our conversation tomorrow.  I asked him to consider if there is seeing some of these signs and symptoms in [name] [name].  Sister [name] [name] states she will be at bedside tomorrow morning.  Daughter [name] [name] states that she will not, but we plan for telephone call tomorrow around 1 PM.   Length of Stay: 0  Current Medications: Scheduled Meds:  . fluticasone  1 spray Each Nare Daily    Continuous Infusions: . sodium chloride 75 mL/hr at 07/28/17 1253  . [START ON 07/29/2017] cefTRIAXone (ROCEPHIN)  IV      PRN Meds: hydrALAZINE, ondansetron **OR** ondansetron (ZOFRAN) IV  Physical Exam  Constitutional: No distress.  Nursing note and vitals reviewed.           Vital Signs: BP (!) 125/93   Pulse 98   Temp 98 F (36.7 C) (Axillary)   Resp (!) 34   Ht 5\' 9"  (1.753 m)   Wt 74.1 kg (163 lb 5.8 oz)   SpO2 94%   BMI 24.12 kg/m  SpO2: SpO2: 94 % O2 Device: O2 Device: Oxymask O2 Flow Rate: O2 Flow Rate (L/min): 12 L/min  Intake/output summary:   Intake/Output Summary (Last 24 hours) at 07/28/2017 1421 Last data filed at 07/28/2017 1256 Gross per 24 hour  Intake 500 ml  Output 400 ml  Net 100 ml   LBM:   Baseline Weight: Weight: 74.1 kg (163 lb 5.8 oz) Most recent weight: Weight: 74.1 kg (163 lb 5.8 oz)       Palliative Assessment/Data:      Patient Active Problem List   Diagnosis Date Noted  . Encephalopathy acute 07/28/2017  . Palliative care by  specialist   . Goals of care, counseling/discussion   . DNR (do not resuscitate) discussion   . Pulmonary edema, acute ([place]) 07/16/2017  . Acute blood loss anemia 07/16/2017  . Sepsis ([place]) 07/04/2017  . Hypokalemia 07/04/2017  . Leukocytosis 07/04/2017  . Urothelial carcinoma ([place]) 06/25/2017  . AKI (acute kidney injury) ([place])   . [name] anemia 04/22/2017  . Shortness of breath   . Acute on chronic respiratory failure ([place]) 10/24/2015  . Pressure ulcer 09/25/2015  . Respiratory failure ([place]) 09/24/2015  . Acute on chronic respiratory failure with hypercapnia ([place])   . Frequent falls 09/21/2015  . HLD (hyperlipidemia) 08/18/2015  . Chronic anemia 08/18/2015  . COPD exacerbation ([place]) 08/18/2015  . Chronic pain syndrome 05/01/2015  . Chronic respiratory failure with hypoxia ([place])   . Acute on chronic respiratory failure with hypoxia ([place]) 02/25/2014  . COPD (chronic obstructive pulmonary disease) ([place]) 02/09/2014  . Hyperglycemia, drug-induced 03/26/2013  . Chronic back pain   . Anxiety 03/13/2012  . Hyperlipidemia 03/13/2012  . BPH (benign prostatic hyperplasia) 10/01/2011  . Tobacco abuse 09/29/2011  . Bladder tumor 09/28/2011  . PULMONARY NODULE 12/27/2009  . Essential hypertension 12/26/2009  . OSTEOARTHRITIS 12/26/2009    Palliative Care Assessment & Plan   Patient Profile: 77 y.o. male  with past medical history of high-grade urothelial carcinoma since 2013, now with hematuria and 4 hospital admissions in 6 weeks, COPD continuous home O2 2 L, pulmonary nodule with stable appearance since 2013, anxiety and depression, chronic back pain, high blood pressure and cholesterol, [facility] local SNF for rehab,  admitted on 07/28/2017 with acute encephalopathy-likely multifactorial.  Assessment: acute encephalopathy-likely multifactorial:  Likely rt polypharmacy with impaired liver and kidney  fx. Hold sedating meds. IV fluid, progressive disease process.  24-48 hours for  outcomes.  Recommendations/Plan:  At this point, 24-48 hours for outcomes.  Goals of Care and Additional Recommendations:  Limitations on Scope of Treatment: treat the treatable, buit no CPR, no Intubation.   Code Status:    Code Status Orders  (From admission, onward)        Start     Ordered   07/28/17 1241  Full code  Continuous     07/28/17 1243    Code Status History    Date Active Date Inactive Code Status Order ID Comments User Context   07/16/2017 1837 07/17/2017 2332 Full Code [id number]  Erline Hau, MD Inpatient   07/04/2017 1141 07/08/2017 1713 DNR [id number]  Erline Hau, MD Inpatient   07/04/2017 0714 07/04/2017 1141 DNR [id number]  Orlie Dakin, MD ED   06/25/2017 1744 06/30/2017 1848 DNR [id number]  Orson Eva, MD Inpatient   06/21/2017 2350 06/25/2017 1744 Full Code [id number]  Oswald Hillock, MD Inpatient   04/22/2017 1815 04/24/2017 0353 Full Code [id number]  Bethena Roys, MD Inpatient   10/24/2015 2123 10/27/2015 1613 Full Code [id number]  Bonnell Public, MD Inpatient   09/24/2015 2304 09/28/2015 2119 Full Code [id number]  Jani Gravel, MD Inpatient   08/18/2015 2157 08/22/2015 1658 Full Code [id number]  Rise Patience, MD Inpatient   05/04/2014 1331 05/08/2014 1932 Full Code [id number]  Radene Gunning, NP Inpatient   02/24/2014 1624 02/28/2014 1502 Full Code [id number]  Doree Albee, MD ED   02/09/2014 2213 02/13/2014 1548 Full Code [id number]  Waldemar Dickens, MD Inpatient   11/07/2013 0043 11/08/2013 1452 Full Code [id number]  Phillips Grout, MD Inpatient   03/25/2013 2227 03/30/2013 1715 Full Code [id number]  Phillips Grout, MD Inpatient   02/16/2013 1506 02/20/2013 1458 Full Code [id number]  Donne Hazel, MD ED   03/12/2012 0153 03/17/2012 1541 Partial Code [id number]  Ward, Kerman Passey, RN Inpatient   09/28/2011 2306 10/02/2011 1732 Full Code [id number]  Ethelene Hal, RN Inpatient   04/22/2011 0230 04/25/2011 1343 Full Code [id number]   Bethena Roys, RN Inpatient       Prognosis:   < 3 months or less would not be surprising based on 5 hospitalizations in the last 6 months, 4 of which have been in the last 6 weeks.    Acute encephalopathy, multifactorial, acute blood loss from hematuria related to bladder cancer, along with other comorbid conditions including end-stage COPD.  Discharge Planning:  Likely return to [facility], however daughter is requesting transfer to [facility].  Care plan was discussed with nursing staff, Dr. [name].   Thank you for allowing the Palliative Medicine Team to assist in the care of this patient.   Time In: 1430 Time Out: 1510 Total Time 40 minutes Prolonged Time Billed  no       Greater than 50%  of this time was spent counseling and coordinating care related to the above assessment and plan.  Drue Novel, NP  Please contact Palliative Medicine Team phone at [phone] for questions and concerns.

## 2017-07-28 NOTE — ED Notes (Signed)
Pt transported to CT ?

## 2017-07-28 NOTE — ED Notes (Signed)
First set of blood culture drawn and sent to lab

## 2017-07-28 NOTE — H&P (Addendum)
History and Physical    [name] [name]:[id number] DOB: 11-23-1940 DOA: 07/28/2017  PCP: [name], MD   Patient coming from: SN[facility]  Chief Complaint: Decreased responsiveness  HPI: [name] [name] is a 77 y.o. male with medical history significant for dyslipidemia, hypertension, bladder cancer/hematuria with recent initiation of radiation treatment, and COPD on home 4-5 L nasal cannula with multiple, recent hospital admissions who was recently discharged on 07/17/2017 with anemia as well as volume overload.  He returns today with decreased responsiveness over the last 2 days.  He was noted to have normal blood glucose levels and was supposedly given some Narcan by EMS with mild improvement in alertness.  The patient cannot give any history due to his mental status at this time and daughter is at bedside.   ED Course: Vital signs demonstrate some sinus tachycardia and elevated blood pressure readings.  Laboratory data reveals leukocytosis of 17,000, creatinine of 1.55 with usual baseline of 1.07, and troponin 0.29.  He is noted to have hemoglobin of 9.8 which appears stable.  LFTs are noted to be quite elevated.  Lactic acid is 1.3.  Urine analysis demonstrates some bacteria with moderate leukocytes which appears chronic, but patient does have chronic Foley.  Cardiology called by ED physician who will follow patient while here due to elevated troponin level.  Chest x-ray with stable cardiomegaly, CT head negative, right upper quadrant ultrasound negative.  He has been started on some IV fluid as well as Rocephin.  Acute hepatitis panel has been ordered.  Daughter at the bedside insists that patient is full code.  Review of Systems: Cannot be obtained due to patient condition.  Past Medical History:  Diagnosis Date  . Anxiety 03/13/2012  . Asthma   . 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  Marland Kitchen BPH (benign prostatic hyperplasia) 10/01/2011   Per cystoscopy  . Chronic anxiety    . Chronic back pain   . COPD (chronic obstructive pulmonary disease) ([place])   . Depression   . Emphysema lung ([place])   . GERD (gastroesophageal reflux disease)   . Headache(784.0)   . History of stomach ulcers   . Hypercholesteremia   . Hyperlipidemia 03/13/2012  . On home O2    2L N/C continuous  . Pulmonary nodule 09/2011   Stable appearance  . Tobacco abuse 09/29/2011    Past Surgical History:  Procedure Laterality Date  . CHOLECYSTECTOMY    . CYSTOSCOPY  09/30/2011   Procedure: CYSTOSCOPY FLEXIBLE;  Surgeon: Marissa Nestle, MD;  Location: [facility];  Service: Urology;  Laterality: N/A;  . FRACTURE SURGERY     R ring finger, pin placed  . TRANSURETHRAL RESECTION OF BLADDER TUMOR  10/01/2011   Procedure: TRANSURETHRAL RESECTION OF BLADDER TUMOR (TURBT);  Surgeon: Marissa Nestle, MD;  Location: [facility];  Service: Urology;  Laterality: N/A;     reports that he has been smoking cigarettes.  He has a 29.00 pack-year smoking history. He has never used smokeless tobacco. He reports that he does not drink alcohol or use drugs.  Allergies  Allergen Reactions  . Metrizamide Hives  . Codeine   . Iohexol Hives     Code: HIVES, Desc: PT STATES HE BROKE OUT IN HIVES AND RASH AFTER CT NECK EARLY SEPT 2011; NO RESP PROBLEMS; NEEDS PRE-MEDS; MKS, Onset Date: [id number]   . Ivp Dye [Iodinated Diagnostic Agents] Hives  . Other     Broccoli, Cauliflower     Family History  Problem Relation Age of Onset  . Kidney failure Mother   . Emphysema Brother        was not close to family; does not truly know their medical problems.  . Emphysema Brother   . Emphysema Brother   . Cancer Neg Hx     Prior to Admission medications   Medication Sig Start Date End Date Taking? Authorizing Provider  acetaminophen (TYLENOL) 325 MG tablet Take 650 mg by mouth every 6 (six) hours as needed for headache.   Yes [provider]  albuterol (PROVENTIL HFA;VENTOLIN HFA) 108 (90 Base) MCG/ACT inhaler  Inhale 2 puffs into the lungs every 6 (six) hours as needed for wheezing or shortness of breath.   Yes [provider]  albuterol (PROVENTIL) (2.5 MG/3ML) 0.083% nebulizer solution Take 3 mLs (2.5 mg total) by nebulization every 4 (four) hours as needed. For shortness of breath 11/28/15  Yes Timmothy Euler, MD  ALPRAZolam Duanne Moron) 1 MG tablet Take 1 mg by mouth 3 (three) times daily as needed for anxiety.   Yes [provider]  atorvastatin (LIPITOR) 20 MG tablet Take 20 mg by mouth daily.   Yes [provider]  citalopram (CELEXA) 20 MG tablet Take 20 mg by mouth daily.   Yes [provider]  cyanocobalamin 500 MCG tablet Take 1,000 mcg by mouth daily.    Yes [provider]  DULoxetine (CYMBALTA) 60 MG capsule Take 1 capsule (60 mg total) by mouth daily. 04/24/17  Yes Shahmehdi, Seyed A, MD  fluticasone (FLONASE) 50 MCG/ACT nasal spray Place 1 spray into both nostrils daily.   Yes [provider]  fluticasone furoate-vilanterol (BREO ELLIPTA) 200-25 MCG/INH AEPB Inhale 1 puff into the lungs daily. 04/24/17  Yes Shahmehdi, Seyed A, MD  furosemide (LASIX) 20 MG tablet Take 20 mg by mouth daily.   Yes [provider]  iron polysaccharides (NIFEREX) 150 MG capsule Take 150 mg by mouth daily.    Yes [provider]  loratadine (CLARITIN) 10 MG tablet Take 10 mg by mouth daily.   Yes [provider]  mirtazapine (REMERON) 15 MG tablet Take 15 mg by mouth at bedtime.   Yes [provider]  montelukast (SINGULAIR) 10 MG tablet Take 10 mg by mouth at bedtime.   Yes [provider]  moxifloxacin (AVELOX) 400 MG tablet Take 400 mg by mouth daily at 8 pm.   Yes [provider]  Multiple Vitamin (MULTIVITAMIN WITH MINERALS) TABS tablet Take 1 tablet by mouth daily.   Yes [provider]  NITROSTAT 0.4 MG SL tablet Place 1 tablet under the tongue every 5 (five) minutes as needed for chest pain.   02/22/13  Yes [provider]  omeprazole (PRILOSEC) 20 MG capsule Take 20 mg by mouth daily. 11/11/16  Yes [provider]  oxybutynin (DITROPAN) 5 MG tablet Take 5 mg by mouth 3 (three) times daily.   Yes [provider]  oxyCODONE (OXY IR/ROXICODONE) 5 MG immediate release tablet Take 1 tablet by mouth 2 (two) times daily.   Yes [provider]  roflumilast (DALIRESP) 500 MCG TABS tablet Take 1 Tablet by mouth once daily 11/11/16  Yes [provider]  senna-docusate (SENOKOT-S) 8.6-50 MG tablet Take 1 tablet by mouth 2 (two) times daily.   Yes [provider]  umeclidinium bromide (INCRUSE ELLIPTA) 62.5 MCG/INH AEPB Inhale 1 puff into the lungs daily. 01/01/16  Yes Hawks, Christy A, FNP  levofloxacin (LEVAQUIN) 750 MG tablet  Take 750 mg by mouth daily.    [provider]  prochlorperazine (COMPAZINE) 10 MG tablet Take 10 mg by mouth as directed. Patient takes one tablet by mouth in the morning every Monday, Tuesday, Wednesday, Thursday, and Friday for nausea one hour prior to radiation treatments    [provider]    Physical Exam: Vitals:   07/28/17 1030 07/28/17 1100 07/28/17 1130 07/28/17 1145  BP: (!) 136/104 (!) 138/91 (!) 148/106   Pulse: 98  96 95  Resp:      Temp:      TempSrc:      SpO2: 96%  94% 98%    Constitutional: Minimally responsive to painful stimuli Vitals:   07/28/17 1030 07/28/17 1100 07/28/17 1130 07/28/17 1145  BP: (!) 136/104 (!) 138/91 (!) 148/106   Pulse: 98  96 95  Resp:      Temp:      TempSrc:      SpO2: 96%  94% 98%   Eyes: lids and conjunctivae normal ENMT: Mucous membranes are moist.  Neck: normal, supple Respiratory: clear to auscultation bilaterally. Normal respiratory effort. No accessory muscle use. 12L [place] Cardiovascular: Regular rate and rhythm, no murmurs. No extremity edema. Abdomen: minimal tenderness, no distention. Bowel sounds positive.  Musculoskeletal:  No joint  deformity upper and lower extremities.   Skin: no rashes, lesions, ulcers. Foley with clear, bloody output.  Labs on Admission: I have personally reviewed following labs and imaging studies  CBC: Recent Labs  Lab 07/28/17 0835  WBC 17.1*  NEUTROABS 14.9*  HGB 9.8*  HCT 33.7*  MCV 83.6  PLT 734*   Basic Metabolic Panel: Recent Labs  Lab 07/28/17 0835  NA 140  K 4.2  CL 95*  CO2 31  GLUCOSE 143*  BUN 17  CREATININE 1.55*  CALCIUM 8.3*   GFR: Estimated Creatinine Clearance: 41.2 mL/min (A) (by C-G formula based on SCr of 1.55 mg/dL (H)). Liver Function Tests: Recent Labs  Lab 07/28/17 0835  AST 945*  ALT 825*  ALKPHOS 65  BILITOT 0.8  PROT 6.8  ALBUMIN 2.5*   No results for input(s): LIPASE, AMYLASE in the last 168 hours. Recent Labs  Lab 07/28/17 1049  AMMONIA 14   Coagulation Profile: No results for input(s): INR, PROTIME in the last 168 hours. Cardiac Enzymes: Recent Labs  Lab 07/28/17 0835  TROPONINI 0.29*   BNP (last 3 results) No results for input(s): PROBNP in the last 8760 hours. HbA1C: No results for input(s): HGBA1C in the last 72 hours. CBG: Recent Labs  Lab 07/28/17 0729  GLUCAP 140*   Lipid Profile: No results for input(s): CHOL, HDL, LDLCALC, TRIG, CHOLHDL, LDLDIRECT in the last 72 hours. Thyroid Function Tests: No results for input(s): TSH, T4TOTAL, FREET4, T3FREE, THYROIDAB in the last 72 hours. Anemia Panel: No results for input(s): VITAMINB12, FOLATE, FERRITIN, TIBC, IRON, RETICCTPCT in the last 72 hours. Urine analysis:    Component Value Date/Time   COLORURINE YELLOW 07/28/2017 0741   APPEARANCEUR CLOUDY (A) 07/28/2017 0741   APPEARANCEUR Turbid (A) 01/25/2016 1407   LABSPEC 1.009 07/28/2017 0741   PHURINE 6.0 07/28/2017 0741   GLUCOSEU NEGATIVE 07/28/2017 0741   HGBUR LARGE (A) 07/28/2017 0741   BILIRUBINUR NEGATIVE 07/28/2017 0741   BILIRUBINUR Negative 01/25/2016 1407   KETONESUR NEGATIVE 07/28/2017 0741    PROTEINUR 100 (A) 07/28/2017 0741   UROBILINOGEN 0.2 05/04/2014 0956   NITRITE NEGATIVE 07/28/2017 0741   LEUKOCYTESUR MODERATE (A) 07/28/2017 0741   LEUKOCYTESUR 3+ (  A) 01/25/2016 1407    Radiological Exams on Admission: Ct Head Wo Contrast  Result Date: 07/28/2017 CLINICAL DATA:  Found unresponsive EXAM: CT HEAD WITHOUT CONTRAST TECHNIQUE: Contiguous axial images were obtained from the base of the skull through the vertex without intravenous contrast. COMPARISON:  09/24/2015 FINDINGS: Brain: There is atrophy and chronic small vessel disease changes. No acute intracranial abnormality. Specifically, no hemorrhage, hydrocephalus, mass lesion, acute infarction, or significant intracranial injury. Vascular: No hyperdense vessel or unexpected calcification. Skull: No acute calvarial abnormality. Sinuses/Orbits: Visualized paranasal sinuses and mastoids clear. Orbital soft tissues unremarkable. Other: None IMPRESSION: No acute intracranial abnormality. Atrophy, chronic microvascular disease. Electronically Signed   By: Rolm Baptise M.D.   On: 07/28/2017 09:08   Dg Chest Port 1 View  Result Date: 07/28/2017 CLINICAL DATA:  Altered mental status.  Shortness of breath. EXAM: PORTABLE CHEST 1 VIEW COMPARISON:  Radiograph of July 16, 2017. FINDINGS: Stable cardiomegaly with mild central pulmonary vascular congestion. No pneumothorax or pleural effusion is noted. Mild bibasilar subsegmental atelectasis or infiltrates is noted. Bony thorax is unremarkable. IMPRESSION: Stable bibasilar subsegmental atelectasis or infiltrates. Stable cardiomegaly with central pulmonary vascular congestion. Electronically Signed   By: Marijo Conception, M.D.   On: 07/28/2017 08:02   US Abdomen Limited Ruq  Result Date: 07/28/2017 CLINICAL DATA:  Right upper quadrant pain EXAM: ULTRASOUND ABDOMEN LIMITED RIGHT UPPER QUADRANT COMPARISON:  CT abdomen and pelvis April 23, 2017 FINDINGS: Gallbladder: No gallstones or wall thickening  visualized. There is no pericholecystic fluid. No sonographic Murphy sign noted by sonographer. Common bile duct: Diameter: 3 mm. No intrahepatic or extrahepatic biliary duct dilatation. Liver: No focal lesion identified. Within normal limits in parenchymal echogenicity. Portal vein is patent on color Doppler imaging with normal direction of blood flow towards the liver. IMPRESSION: Study within normal limits. Electronically Signed   By: Lowella Grip III M.D.   On: 07/28/2017 11:13    EKG: Independently reviewed.  Sinus tachycardia at 106 bpm.  Assessment/Plan Principal Problem:   Encephalopathy acute Active Problems:   Essential hypertension   Bladder tumor   COPD (chronic obstructive pulmonary disease) ([facility])   Acute on chronic respiratory failure with hypoxia ([facility])   HLD (hyperlipidemia)   AKI (acute kidney injury) ([place])   DNR (do not resuscitate) discussion    1. Acute encephalopathy-likely multifactorial.  I believe that much of this is related to polypharmacy in the setting of liver and renal dysfunction and therefore, I will hold his current medications.  I will also initiate Rocephin empirically and monitor urine cultures.  Additionally, patient does have dehydration for which I will continue IV fluid.  Ammonia levels within normal limits.  Continue to monitor with neuro checks in stepdown unit.  CT head within normal limits.  Much of this is also progressive with poor prognosis overall for which I will consult palliative care.  Hold n.p.o. except meds for now until patient more alert. 2. Acute on chronic hypoxemic respiratory failure.  Patient does not currently appear volume overloaded and much of this may be related to his current mentation and poor respiratory drive.  Continue to monitor closely and wean down to his usual baseline of 4-5 L. 3. AK I-likely prerenal.  Continue IV fluid hydration.  Repeat a.m. labs and avoid nephrotoxic agents. Strict I/Os. 4. Elevated troponin.  No  sign of ACS currently noted and EKG within normal limits.  Cardiology consultation appreciated. Recent Echo 07/17/17 with LVEF 60-65% and no WMAs. 5. Transaminitis.  Right upper quadrant ultrasound within normal limits.  Hepatitis panel pending.  We will also check Tylenol level. Hold statin and Tylenol containing products. 6. Hypertension with poor control.  Hydralazine IV as needed. 7. Bladder carcinoma.  Patient currently undergoing radiation treatments which I believe may be an appropriate given his overall prognosis.  Continue to monitor hematuria. 8. Anemia.  Currently stable.  Monitor repeat CBC and transfuse as needed.  SCDs due to ongoing hematuria.   DVT prophylaxis: SCDs Code Status: Full Family Communication: Daughter at bedside Disposition Plan: Treatment of AK I with IV fluid as well as treatment of possible UTI to see if encephalopathy improves; palliative care discussion Consults called: Cardiology and palliative care Admission status: Inpatient, stepdown unit   Chiana Wamser Darleen Crocker DO [facility] Pager [phone]  If 7PM-7AM, please contact night-coverage [email or web address] Password [email or web address]  07/28/2017, 12:12 PM

## 2017-07-28 NOTE — ED Notes (Signed)
Hospitalist at bedside talking with daughter

## 2017-07-29 ENCOUNTER — Encounter (HOSPITAL_COMMUNITY): Payer: Self-pay | Admitting: Student

## 2017-07-29 DIAGNOSIS — G9340 Encephalopathy, unspecified: Secondary | ICD-10-CM | POA: Diagnosis not present

## 2017-07-29 DIAGNOSIS — D494 Neoplasm of unspecified behavior of bladder: Secondary | ICD-10-CM | POA: Diagnosis not present

## 2017-07-29 DIAGNOSIS — G9349 Other encephalopathy: Secondary | ICD-10-CM | POA: Diagnosis not present

## 2017-07-29 DIAGNOSIS — Z515 Encounter for palliative care: Secondary | ICD-10-CM | POA: Diagnosis not present

## 2017-07-29 DIAGNOSIS — R748 Abnormal levels of other serum enzymes: Secondary | ICD-10-CM | POA: Diagnosis not present

## 2017-07-29 DIAGNOSIS — Z7189 Other specified counseling: Secondary | ICD-10-CM

## 2017-07-29 DIAGNOSIS — R464 Slowness and poor responsiveness: Secondary | ICD-10-CM | POA: Diagnosis not present

## 2017-07-29 LAB — CBC
HEMATOCRIT: 31.3 % — AB (ref 39.0–52.0)
Hemoglobin: 9.2 g/dL — ABNORMAL LOW (ref 13.0–17.0)
MCH: 24.5 pg — AB (ref 26.0–34.0)
MCHC: 29.4 g/dL — ABNORMAL LOW (ref 30.0–36.0)
MCV: 83.2 fL (ref 78.0–100.0)
PLATELETS: 427 10*3/uL — AB (ref 150–400)
RBC: 3.76 MIL/uL — ABNORMAL LOW (ref 4.22–5.81)
RDW: 19.1 % — AB (ref 11.5–15.5)
WBC: 13.8 10*3/uL — AB (ref 4.0–10.5)

## 2017-07-29 LAB — TROPONIN I: TROPONIN I: 0.6 ng/mL — AB (ref <0.03)

## 2017-07-29 LAB — COMPREHENSIVE METABOLIC PANEL
ALBUMIN: 2.5 g/dL — AB (ref 3.5–5.0)
ALT: 993 U/L — AB (ref 17–63)
AST: 953 U/L — ABNORMAL HIGH (ref 15–41)
Alkaline Phosphatase: 59 U/L (ref 38–126)
Anion gap: 11 (ref 5–15)
BUN: 21 mg/dL — AB (ref 6–20)
CO2: 31 mmol/L (ref 22–32)
CREATININE: 1.08 mg/dL (ref 0.61–1.24)
Calcium: 8.5 mg/dL — ABNORMAL LOW (ref 8.9–10.3)
Chloride: 98 mmol/L — ABNORMAL LOW (ref 101–111)
GFR calc Af Amer: >60 mL/min (ref >60)
GFR calc non Af Amer: >60 mL/min (ref >60)
GLUCOSE: 103 mg/dL — AB (ref 65–99)
POTASSIUM: 3.4 mmol/L — AB (ref 3.5–5.1)
SODIUM: 140 mmol/L (ref 135–145)
Total Bilirubin: 0.6 mg/dL (ref 0.3–1.2)
Total Protein: 6.4 g/dL — ABNORMAL LOW (ref 6.5–8.1)

## 2017-07-29 LAB — HEPATITIS PANEL, ACUTE
HEP A IGM: NEGATIVE
HEP B S AG: NEGATIVE
Hep B C IgM: NEGATIVE

## 2017-07-29 LAB — PROTIME-INR
INR: 1.09
Prothrombin Time: 14 seconds (ref 11.4–15.2)

## 2017-07-29 LAB — ACETAMINOPHEN LEVEL: Acetaminophen (Tylenol), Serum: <10 ug/mL — ABNORMAL LOW (ref 10–30)

## 2017-07-29 MED ORDER — POTASSIUM CHLORIDE 20 MEQ/15ML (10%) PO SOLN
40.0000 meq | Freq: Once | ORAL | Status: AC
  Administered 2017-07-29: 40 meq via ORAL
  Filled 2017-07-29: qty 30

## 2017-07-29 MED ORDER — LORAZEPAM 2 MG/ML IJ SOLN
0.5000 mg | Freq: Four times a day (QID) | INTRAMUSCULAR | Status: DC | PRN
  Administered 2017-07-29 (×2): 0.5 mg via INTRAVENOUS
  Filled 2017-07-29 (×2): qty 1

## 2017-07-29 NOTE — Plan of Care (Signed)
Page to call son Carnel Stegman.  Call to phone number listed, left voicemail message. No charge

## 2017-07-29 NOTE — Progress Notes (Signed)
Daily Progress Note   Patient Name: [name] [name]       Date: 07/29/2017 DOB: 09-18-1940  Age: 77 y.o. MRN#: [id number] Attending Physician: Rodena Goldmann, DO Primary Care Physician: [name], MD Admit Date: 07/28/2017  Reason for Consultation/Follow-up: Establishing goals of care, Hospice Evaluation and Psychosocial/spiritual support  Subjective: [name] [name] is resting quietly in bed.  He greets me making an briefly keeping eye contact.  He is alert and oriented to person only.  He is calm and cooperative.  There is no family at bedside at this time.  We talked about discharge soon.  He shares that his preference is to transfer to [facility] residential SNF.  I share that, unfortunately, [facility] is not offering him a residential bed at this time.   As I go to the nursing desk, I am informed that daughter, Jackston Oaxaca is on the phone.  Neoma Laming and I talk about her father's improvement in mental status today.  I shared that his sleepiness of yesterday is being attributed to his use of narcotics and anti-anxiety medication.  [name] states that she worries that her father will not be comfortable without these medications.  We talked about finding a balance in comfort and not over sedating [name] [name].  We talked about the benefits of hospice at [facility].  I share that hospice can provide an extra layer of support, make sure that [name] [name] is comfortable.  Debbie agrees to hospice services at [facility], she requests hospice of [place].  She states that she does not feel [name] [name] would benefit from further radiation therapy, or returning to the hospital.  I anticipate that [name] will need continued education and reassurance related to these choices.   We talk about  healthcare power of attorney.  Debbie and I had a detailed discussion yesterday related to power of attorney and Debbie's stated desire to add Mr. Sessums sister, Joycelyn Man, to his healthcare power of attorney paperwork.  I share that only [name] can add or change his healthcare power of attorney paperwork.  Debbie again today asks about healthcare power of attorney and advanced directive paperwork.  She asks if she should Elta Guadeloupe some choices on this paperwork for advanced directives.  I again today explained that she cannot make changes  to [name] paperwork.  I encouraged her to bring the paperwork in in its current form, we will use it as a guideline for what is important to [name] [name].    [name] becomes very tearful over the phone stating, "I do not believe my daddy's organs are shutting down".  She goes further to state, "I believe God will heal him".  We talked about continuing to treat the treatable, continuing to care for [name] [name].  I encouraged Debbie to lean on her family for support in caring for [name] [name].  Although patient and family are requesting a change of residential SNF from [facility] to [facility], [facility] is not accepting [name] [name] as a new resident.  Return to [facility] residential SNF with the benefits of hospice of [place].  Continue to treat the treatable but no further chemotherapy, radiation, or hospitalizations, per daughter/HC POA, Raechel Chute.  Manav Pierotti will likely need continued education and reassurance related to the benefits of hospice.  Length of Stay: 1  Current Medications: Scheduled Meds:  . feeding supplement (ENSURE ENLIVE)  237 mL Oral BID BM  . fluticasone  1 spray Each Nare Daily    Continuous Infusions: . cefTRIAXone (ROCEPHIN)  IV Stopped (07/29/17 1152)    PRN Meds: hydrALAZINE, ondansetron **OR** ondansetron (ZOFRAN) IV  Physical Exam  Constitutional: No distress.  Makes and briefly keeps eye contact,  appears acutely/chronically ill  HENT:  Head: Atraumatic.  Cardiovascular: Normal rate.  Pulmonary/Chest: Effort normal. No respiratory distress.  Abdominal: Soft.  Musculoskeletal: He exhibits no edema.  Neurological: He is alert.  Oriented to person only.  Skin: Skin is warm and dry.  Psychiatric:  Calm and cooperative at this time  Nursing note and vitals reviewed.           Vital Signs: BP (!) 170/96   Pulse 86   Temp 98 F (36.7 C) (Oral)   Resp (!) 34   Ht 5\' 9"  (1.753 m)   Wt 74.1 kg (163 lb 5.8 oz)   SpO2 94%   BMI 24.12 kg/m  SpO2: SpO2: 94 % O2 Device: O2 Device: High Flow Nasal Cannula O2 Flow Rate: O2 Flow Rate (L/min): 4 L/min  Intake/output summary:   Intake/Output Summary (Last 24 hours) at 07/29/2017 1251 Last data filed at 07/29/2017 0500 Gross per 24 hour  Intake 801.25 ml  Output 1300 ml  Net -498.75 ml   LBM:   Baseline Weight: Weight: 74.1 kg (163 lb 5.8 oz) Most recent weight: Weight: 74.1 kg (163 lb 5.8 oz)       Palliative Assessment/Data:    Flowsheet Rows     Most Recent Value  Intake Tab  Referral Department  Hospitalist  Unit at Time of Referral  Intermediate Care Unit  Palliative Care Primary Diagnosis  Other (Comment) [encephalopathy]  Date Notified  07/28/17  Palliative Care Type  Return patient Palliative Care  Reason for referral  Psychosocial or Spiritual support, Clarify Goals of Care  Date of Admission  07/28/17  Date first seen by Palliative Care  07/28/17  # of days Palliative referral response time  0 Day(s)  # of days IP prior to Palliative referral  0  Clinical Assessment  Palliative Performance Scale Score  30%  Pain Max last 24 hours  Not able to report  Pain Min Last 24 hours  Not able to report  Dyspnea Max Last 24 Hours  Not able to report  Dyspnea Min  Last 24 hours  Not able to report  Psychosocial & Spiritual Assessment  Palliative Care Outcomes  Patient/Family meeting held?  Yes  Who was at the  meeting?  pt and daughter       Patient Active Problem List   Diagnosis Date Noted  . Encephalopathy acute 07/28/2017  . Palliative care by specialist   . Goals of care, counseling/discussion   . DNR (do not resuscitate) discussion   . Pulmonary edema, acute ([place]) 07/16/2017  . Acute blood loss anemia 07/16/2017  . Sepsis ([place]) 07/04/2017  . Hypokalemia 07/04/2017  . Leukocytosis 07/04/2017  . Urothelial carcinoma ([place]) 06/25/2017  . AKI (acute kidney injury) ([place])   . Severe anemia 04/22/2017  . Shortness of breath   . Acute on chronic respiratory failure ([place]) 10/24/2015  . Pressure ulcer 09/25/2015  . Respiratory failure ([place]) 09/24/2015  . Acute on chronic respiratory failure with hypercapnia ([place])   . Frequent falls 09/21/2015  . HLD (hyperlipidemia) 08/18/2015  . Chronic anemia 08/18/2015  . COPD exacerbation ([place]) 08/18/2015  . Chronic pain syndrome 05/01/2015  . Chronic respiratory failure with hypoxia ([place])   . Acute on chronic respiratory failure with hypoxia ([place]) 02/25/2014  . COPD (chronic obstructive pulmonary disease) ([place]) 02/09/2014  . Hyperglycemia, drug-induced 03/26/2013  . Chronic back pain   . Anxiety 03/13/2012  . Hyperlipidemia 03/13/2012  . BPH (benign prostatic hyperplasia) 10/01/2011  . Tobacco abuse 09/29/2011  . Bladder tumor 09/28/2011  . PULMONARY NODULE 12/27/2009  . Essential hypertension 12/26/2009  . OSTEOARTHRITIS 12/26/2009    Palliative Care Assessment & Plan   Patient Profile: 77 y.o.malewith past medical history of high-grade urothelial carcinoma since 2013, now with hematuria and 4 hospital admissions in 6 weeks, COPD continuous home O2 2 L, pulmonary nodule with stable appearance since 2013, anxiety and depression, chronic back pain, high blood pressure and cholesterol, [facility] local SNF for rehab,admitted on4/15/2019with acute encephalopathy-likely multifactorial.  Assessment: acute encephalopathy-likely  multifactorial:  Likely rt polypharmacy with impaired liver and kidney fx. Hold sedating meds. IV fluid, progressive disease process.  24-48 hours for outcomes.  Recommendations/Plan:  Although patient and family are requesting a change of residential SNF from [facility] to [facility], [facility] is not accepting Mr. Cerveny as a new resident.  Return to [facility] residential SNF with the benefits of hospice of [place].  Continue to treat the treatable but no further chemotherapy, radiation, or hospitalizations, per daughter/HC POA, Raechel Chute.  Dantre Yearwood will likely need continued coaching and education related to the benefits of hospice.  Goals of Care and Additional Recommendations:  Limitations on Scope of Treatment: Continue to treat the treatable but no CPR, no intubation.  Code Status:    Code Status Orders  (From admission, onward)        Start     Ordered   07/28/17 1536  Do not attempt resuscitation (DNR)  Continuous    Question Answer Comment  In the event of cardiac or respiratory ARREST Do not call a "code blue"   In the event of cardiac or respiratory ARREST Do not perform Intubation, CPR, defibrillation or ACLS   In the event of cardiac or respiratory ARREST Use medication by any route, position, wound care, and other measures to relive pain and suffering. May use oxygen, suction and manual treatment of airway obstruction as needed for comfort.      07/28/17 1535    Code Status History    Date  Active Date Inactive Code Status Order ID Comments User Context   07/28/2017 1243 07/28/2017 1535 Full Code [id number]  Rodena Goldmann, DO ED   07/16/2017 1837 07/17/2017 2332 Full Code [id number]  Isaac Bliss, Rayford Halsted, MD Inpatient   07/04/2017 1141 07/08/2017 1713 DNR [id number]  Erline Hau, MD Inpatient   07/04/2017 0714 07/04/2017 1141 DNR [id number]  Orlie Dakin, MD ED   06/25/2017 1744 06/30/2017 1848 DNR [id number]  Orson Eva, MD  Inpatient   06/21/2017 2350 06/25/2017 1744 Full Code [id number]  Oswald Hillock, MD Inpatient   04/22/2017 1815 04/24/2017 0353 Full Code [id number]  Bethena Roys, MD Inpatient   10/24/2015 2123 10/27/2015 1613 Full Code [id number]  Bonnell Public, MD Inpatient   09/24/2015 2304 09/28/2015 2119 Full Code [id number]  Jani Gravel, MD Inpatient   08/18/2015 2157 08/22/2015 1658 Full Code [id number]  Rise Patience, MD Inpatient   05/04/2014 1331 05/08/2014 1932 Full Code [id number]  Radene Gunning, NP Inpatient   02/24/2014 1624 02/28/2014 1502 Full Code [id number]  Doree Albee, MD ED   02/09/2014 2213 02/13/2014 1548 Full Code [id number]  Waldemar Dickens, MD Inpatient   11/07/2013 0043 11/08/2013 1452 Full Code [id number]  Phillips Grout, MD Inpatient   03/25/2013 2227 03/30/2013 1715 Full Code [id number]  Phillips Grout, MD Inpatient   02/16/2013 1506 02/20/2013 1458 Full Code [id number]  Donne Hazel, MD ED   03/12/2012 0153 03/17/2012 1541 Partial Code [id number]  Ward, Kerman Passey, RN Inpatient   09/28/2011 2306 10/02/2011 1732 Full Code [id number]  Ethelene Hal, RN Inpatient   04/22/2011 0230 04/25/2011 1343 Full Code [id number]  Bethena Roys, RN Inpatient       Prognosis:   < 3 months or less would not be surprising based on 5 hospitalizations in the last 6 months, 4 of which have been in the last 6 weeks.   Acute encephalopathy, multifactorial, acute blood loss from hematuria related to bladder cancer, along with other comorbid conditions including end-stage COPD.  Discharge Planning:  Return to residential SNF, [place], with hospice of [place] to treat the treatable.  Care plan was discussed with nursing staff, social worker, and Dr. [name].   Thank you for allowing the Palliative Medicine Team to assist in the care of this patient.   Time In: 1315 Time Out: 1400 Total Time 45 minutes Prolonged Time Billed  yes       Greater than 50%  of this time was spent  counseling and coordinating care related to the above assessment and plan.  Drue Novel, NP  Please contact Palliative Medicine Team phone at [phone] for questions and concerns.

## 2017-07-29 NOTE — Progress Notes (Signed)
Initial Nutrition Assessment  DOCUMENTATION CODES:  Not applicable  INTERVENTION:  MD agreeable to diet advancement to Full liquids.   Will order Ensure Enlive po BID, each supplement provides 350 kcal and 20 grams of protein  Please note, patient appears to have lost 20 lbs (10.8%) of his BW within the last month, roughly coinciding with beginning radiation therapy/SNF placement  Follow intake, weight and goals of care.   NUTRITION DIAGNOSIS:  Inadequate oral intake related to lethargy/confusion, cancer and cancer related treatments as evidenced by loss of >20 lbs in 1 month (10.8% of bw)  GOAL:  Patient will meet greater than or equal to 90% of their needs  MONITOR:  PO intake, Supplement acceptance, Labs, Diet advancement, Weight trends  REASON FOR ASSESSMENT:  Malnutrition Screening Tool    ASSESSMENT:  77 y/o male PMHx Chronic resp failure, COPD, CHF, HTN/HLD and recurrent bladder cancer recently beginning palliative radiation. Presents from SNF due to AMS/decreased responsiveness x 2 days. Admits for Acute encephalopathy thought related to polypharmacy, AKI and elevated troponin.   Patient appears to have only begun his palliative radiation less than 1 month ago. He appears to have declined significantly over prior month. Noted weight 3/18 was 184 lbs 5 oz. Today, he is listed at 163 lbs 6 oz. He has lost >20 lbs, nearly 11% bw, x 29month  Pt alone on RD arrival. He answers questions appropriately, though certainly does display some confusion. Accuracy of answers is questionable.   RD asked about his weight loss and if he had been eating well recently. Pt reports that he was eating poorly at [facility] due to "they serve the same thing everyday". Essentially he just did not like the menu there. He does report drinking supplements and taking vitamins.   Patient denies any n/v/c/d. He says he actually "feels great" overall. He says his appetite is now good. He is seen with a  clear liquid tray next to bedside, 100% completed. He asks for diet advancement. He is agreeable to continuing supplements. Given that he ate and tolerated CL well, will see if MD agreeable to diet advancement to fulls. Could begin Ensure then. Paged MD, received V.O to advance diet.   Suspected severe malnutrition, but able to dx given very intake hx is of very poor quality  Physical Exam: Mild orbital wasting. Overall WDL.   Labs: Albumin:2.5, WBC:13.8, K:3.4,  Meds: Ensure, IV abx,    Recent Labs  Lab 07/28/17 0835 07/29/17 0429  NA 140 140  K 4.2 3.4*  CL 95* 98*  CO2 31 31  BUN 17 21*  CREATININE 1.55* 1.08  CALCIUM 8.3* 8.5*  GLUCOSE 143* 103*   NUTRITION - FOCUSED PHYSICAL EXAM:   Most Recent Value  Orbital Region  Mild depletion  Upper Arm Region  No depletion  Thoracic and Lumbar Region  No depletion  Buccal Region  No depletion  Temple Region  No depletion  Clavicle Bone Region  No depletion  Clavicle and Acromion Bone Region  No depletion  Scapular Bone Region  No depletion  Dorsal Hand  No depletion  Patellar Region  No depletion  Anterior Thigh Region  No depletion  Posterior Calf Region  No depletion  Edema (RD Assessment)  None     Diet Order:  Diet clear liquid Room service appropriate? Yes; Fluid consistency: Thin  EDUCATION NEEDS:  No education needs have been identified at this time  Skin:  Skin Assessment: Reviewed RN Assessment  Last BM:  Unknown  Height:  Ht Readings from Last 1 Encounters:  07/28/17 5\' 9"  (1.753 m)   Weight:  Wt Readings from Last 1 Encounters:  07/29/17 163 lb 5.8 oz (74.1 kg)   Wt Readings from Last 10 Encounters:  07/29/17 163 lb 5.8 oz (74.1 kg)  07/16/17 182 lb 5.1 oz (82.7 kg)  07/06/17 179 lb 3.7 oz (81.3 kg)  06/30/17 184 lb 4.9 oz (83.6 kg)  04/22/17 182 lb 15.7 oz (83 kg)  03/02/16 165 lb (74.8 kg)  01/15/16 160 lb (72.6 kg)  01/01/16 164 lb (74.4 kg)  11/28/15 157 lb 8 oz (71.4 kg)  11/21/15 157 lb  12.8 oz (71.6 kg)   Ideal Body Weight:  72.73 kg  BMI:  Body mass index is 24.12 kg/m.  Estimated Nutritional Needs:  Kcal:  2100-2300 (28-31 kcal/kg bw) Protein:  96-111g Pro Fluid:  >2.2 L fluid (30 ml/kg bw)  Burtis Junes RD, LDN, CNSC Clinical Nutrition Available Tues-Sat via Pager: [phone] 07/29/2017 2:42 PM

## 2017-07-29 NOTE — Consult Note (Addendum)
Cardiology Consult    Patient ID: [name] [name]; [id number]; 1941-02-18   Admit date: 07/28/2017 Date of Consult: 07/29/2017  Primary Care Provider: [name], MD Primary Cardiologist: New to [facility] - Dr. Harl Bowie  Patient Profile    [name] [name] is a 77 y.o. male with past medical history of chronic hypoxic respiratory failure (on 4-5L [place] at baseline), COPD, chronic diastolic CHF, HTN, HLD, and bladder cancer (undergoing palliative radiation) who is being seen today for the evaluation of elevated troponin at the request of Dr. [name].   History of Present Illness    [name] [name] presented to [facility] ED on 07/28/2017 after being found unresponsive by nursing staff at [facility]. Narcan was administered by EMS with some improvement in alertness.   Initial labs show WBC 17.1, Hgb 9.8, platelets 449, Na+ 140, K+ 4.2, creatinine 1.55 (baseline 0.9 - 1.1). AST 945. ALT 825. BNP 162. Blood cultures negative thus far. Initial troponin 0.29 with repeat values of 0.71, 0.69, and 0.60. EKG shows sinus tachycardia, HR 106, with no acute ST or T-wave changes. CXR shows stable bibasilar subsegmental atelectasis or infiltrates with stable cardiomegaly with central pulmonary vascular congestion. CT Head showing chronic microvascular disease with no acute intracranial abnormalities. RUQ [place] with no acute findings.  An echocardiogram was performed and shows a preserved EF of 65% to 70%, no regional wall motion abnormalities, mild MR, and trivial TR with PA peak pressure elevated to 46 mm Hg (S).  He has been admitted for further evaluation of his acute encephalopathy which is felt to be due to polypharmacy and possible UTI. Has been started on Ceftriaxone. Palliative Care has been consulted and the patient has been made DNR. The family wants to "treat the treatable but no aggressive measures" by review of Palliative Care notes.   During the encounter today, no family is at the bedside but the patient is awake and  talkative. He denies any current pain but says he is very thirsty and hungry. He wishes to get out of bed as soon as possible. Asks "did he die yesterday" as he is unable to recall any of the events from yesterday. Is aware he is currently admitted to the hospital but keeps stating he "is here for surgery".   Past Medical History:  Diagnosis Date  . Anxiety 03/13/2012  . Asthma   . 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  Marland Kitchen BPH (benign prostatic hyperplasia) 10/01/2011   Per cystoscopy  . Chronic anxiety   . Chronic back pain   . COPD (chronic obstructive pulmonary disease) (HCC)    a. on 4-5L [place] at baseline  . Depression   . Emphysema lung ([place])   . GERD (gastroesophageal reflux disease)   . Headache(784.0)   . History of stomach ulcers   . Hypercholesteremia   . Hyperlipidemia 03/13/2012  . Pulmonary nodule 09/2011   Stable appearance  . Tobacco abuse 09/29/2011    Past Surgical History:  Procedure Laterality Date  . CHOLECYSTECTOMY    . CYSTOSCOPY  09/30/2011   Procedure: CYSTOSCOPY FLEXIBLE;  Surgeon: Marissa Nestle, MD;  Location: [facility];  Service: Urology;  Laterality: N/A;  . FRACTURE SURGERY     R ring finger, pin placed  . TRANSURETHRAL RESECTION OF BLADDER TUMOR  10/01/2011   Procedure: TRANSURETHRAL RESECTION OF BLADDER TUMOR (TURBT);  Surgeon: Marissa Nestle, MD;  Location: [facility];  Service: Urology;  Laterality: N/A;     Home Medications:  Prior to Admission medications   Medication Sig Start Date End Date Taking? Authorizing Provider  acetaminophen (TYLENOL) 325 MG tablet Take 650 mg by mouth every 6 (six) hours as needed for headache.   Yes [provider]  albuterol (PROVENTIL HFA;VENTOLIN HFA) 108 (90 Base) MCG/ACT inhaler Inhale 2 puffs into the lungs every 6 (six) hours as needed for wheezing or shortness of breath.   Yes [provider]  albuterol (PROVENTIL) (2.5 MG/3ML) 0.083% nebulizer solution Take 3 mLs (2.5 mg  total) by nebulization every 4 (four) hours as needed. For shortness of breath 11/28/15  Yes Timmothy Euler, MD  ALPRAZolam Duanne Moron) 1 MG tablet Take 1 mg by mouth 3 (three) times daily as needed for anxiety.   Yes [provider]  atorvastatin (LIPITOR) 20 MG tablet Take 20 mg by mouth daily.   Yes [provider]  citalopram (CELEXA) 20 MG tablet Take 20 mg by mouth daily.   Yes [provider]  cyanocobalamin 500 MCG tablet Take 1,000 mcg by mouth daily.    Yes [provider]  DULoxetine (CYMBALTA) 60 MG capsule Take 1 capsule (60 mg total) by mouth daily. 04/24/17  Yes Shahmehdi, Seyed A, MD  fluticasone (FLONASE) 50 MCG/ACT nasal spray Place 1 spray into both nostrils daily.   Yes [provider]  fluticasone furoate-vilanterol (BREO ELLIPTA) 200-25 MCG/INH AEPB Inhale 1 puff into the lungs daily. 04/24/17  Yes Shahmehdi, Seyed A, MD  furosemide (LASIX) 20 MG tablet Take 20 mg by mouth daily.   Yes [provider]  iron polysaccharides (NIFEREX) 150 MG capsule Take 150 mg by mouth daily.    Yes [provider]  loratadine (CLARITIN) 10 MG tablet Take 10 mg by mouth daily.   Yes [provider]  mirtazapine (REMERON) 15 MG tablet Take 15 mg by mouth at bedtime.   Yes [provider]  montelukast (SINGULAIR) 10 MG tablet Take 10 mg by mouth at bedtime.   Yes [provider]  moxifloxacin (AVELOX) 400 MG tablet Take 400 mg by mouth daily at 8 pm.   Yes [provider]  Multiple Vitamin (MULTIVITAMIN WITH MINERALS) TABS tablet Take 1 tablet by mouth daily.   Yes [provider]  NITROSTAT 0.4 MG SL tablet Place 1 tablet under the tongue every 5 (five) minutes as needed for chest pain.  02/22/13  Yes [provider]  omeprazole (PRILOSEC) 20 MG capsule Take 20 mg by mouth daily. 11/11/16  Yes [provider]  oxybutynin (DITROPAN) 5 MG tablet Take 5 mg by mouth 3 (three)  times daily.   Yes [provider]  oxyCODONE (OXY IR/ROXICODONE) 5 MG immediate release tablet Take 1 tablet by mouth 2 (two) times daily.   Yes [provider]  roflumilast (DALIRESP) 500 MCG TABS tablet Take 1 Tablet by mouth once daily 11/11/16  Yes [provider]  senna-docusate (SENOKOT-S) 8.6-50 MG tablet Take 1 tablet by mouth 2 (two) times daily.   Yes [provider]  umeclidinium bromide (INCRUSE ELLIPTA) 62.5 MCG/INH AEPB Inhale 1 puff into the lungs daily. 01/01/16  Yes Hawks, Christy A, FNP  levofloxacin (LEVAQUIN) 750 MG tablet Take 750 mg by mouth daily.    [provider]  prochlorperazine (COMPAZINE) 10 MG tablet Take 10 mg by mouth as directed. Patient takes one tablet by mouth in the morning every Monday, Tuesday, Wednesday, Thursday, and Friday for nausea one hour prior to radiation treatments    [provider]    Inpatient Medications: Scheduled Meds: . feeding supplement (ENSURE ENLIVE)  237 mL Oral BID BM  . fluticasone  1 spray Each Nare Daily   Continuous Infusions: . cefTRIAXone (ROCEPHIN)  IV     PRN Meds: hydrALAZINE, ondansetron **OR** ondansetron (ZOFRAN) IV  Allergies:    Allergies  Allergen Reactions  . Metrizamide Hives  . Codeine   . Iohexol Hives     Code: HIVES, Desc: PT STATES HE BROKE OUT IN HIVES AND RASH AFTER CT NECK EARLY SEPT 2011; NO RESP PROBLEMS; NEEDS PRE-MEDS; MKS, Onset Date: [id number]   . Ivp Dye [Iodinated Diagnostic Agents] Hives  . Other     Broccoli, Cauliflower     Social History:   Social History   Socioeconomic History  . Marital status: Divorced    Spouse name: Not on file  . Number of children: Not on file  . Years of education: Not on file  . Highest education level: Not on file  Occupational History  . Not on file  Social Needs  . Financial resource strain: Not on file  . Food insecurity:    Worry: Not on file    Inability: Not on file  . Transportation  needs:    Medical: Not on file    Non-medical: Not on file  Tobacco Use  . Smoking status: Current Every Day Smoker    Packs/day: 0.50    Years: 58.00    Pack years: 29.00    Types: Cigarettes  . Smokeless tobacco: Never Used  Substance and Sexual Activity  . Alcohol use: No  . Drug use: No  . Sexual activity: Never  Lifestyle  . Physical activity:    Days per week: Not on file    Minutes per session: Not on file  . Stress: Not on file  Relationships  . Social connections:    Talks on phone: Not on file    Gets together: Not on file    Attends religious service: Not on file    Active member of club or organization: Not on file    Attends meetings of clubs or organizations: Not on file    Relationship status: Not on file  . Intimate partner violence:    Fear of current or ex partner: Not on file    Emotionally abused: Not on file    Physically abused: Not on file    Forced sexual activity: Not on file  Other Topics Concern  . Not on file  Social History Narrative  . Not on file     Family History:    Family History  Problem Relation Age of Onset  . Kidney failure Mother   . Emphysema Brother        was not close to family; does not truly know their medical problems.  . Emphysema Brother   . Emphysema Brother   . Cancer Neg Hx       Review of Systems    General:  No chills, fever, night sweats or weight changes.  Cardiovascular:  No chest pain, dyspnea on exertion, edema, orthopnea, palpitations, paroxysmal nocturnal dyspnea. Dermatological: No rash, lesions/masses Respiratory: No cough, dyspnea Urologic: No hematuria, dysuria Abdominal:   No nausea, vomiting, diarrhea, bright red blood per rectum, melena, or hematemesis Neurologic:  No visual changes or wkns. Positive for changes in mental status.  All other systems reviewed and are otherwise negative except as noted above.  Physical Exam/Data    Vitals:   07/29/17  0500 07/29/17 0600 07/29/17 0700  07/29/17 0800  BP: (!) 160/93 139/74 (!) 173/66   Pulse: 97 84 89   Resp: (!) 28 (!) 27 (!) 30   Temp:    98 F (36.7 C)  TempSrc:    Oral  SpO2: 95% 97% 97% 99%  Weight: 163 lb 5.8 oz (74.1 kg)     Height:        Intake/Output Summary (Last 24 hours) at 07/29/2017 0939 Last data filed at 07/29/2017 0500 Gross per 24 hour  Intake 801.25 ml  Output 1300 ml  Net -498.75 ml   Filed Weights   07/28/17 1420 07/29/17 0500  Weight: 163 lb 5.8 oz (74.1 kg) 163 lb 5.8 oz (74.1 kg)   Body mass index is 24.12 kg/m.   General: Pleasant, elderly Caucasian male appearing in NAD Psych: Normal affect. Neuro: Alert and oriented X 2 (person, place). Moves all extremities spontaneously. HEENT: Normal  Neck: Supple without bruits or JVD. Lungs:  Resp regular and unlabored, decreased breath sounds along bases bilaterally. On 8L [place]. Heart: RRR no s3, s4, or murmurs. Abdomen: Soft, non-tender, non-distended, BS + x 4.  Extremities: No clubbing, cyanosis or edema. SCD's in place. DP/PT/Radials 2+ and equal bilaterally.   EKG:  The EKG was personally reviewed and demonstrates:  Sinus tachycardia, HR 106, with no acute ST or T-wave changes   Labs/Studies     Relevant CV Studies:  Echocardiogram: 07/17/2017 Study Conclusions  - Left ventricle: The cavity size was normal. Wall thickness was at   the upper limits of normal. Systolic function was vigorous. The   estimated ejection fraction was in the range of 65% to 70%. Wall   motion was normal; there were no regional wall motion   abnormalities. The study is not technically sufficient to allow   evaluation of LV diastolic function. - Aortic valve: Mildly to moderately calcified annulus. Trileaflet;   mildly calcified leaflets. There was no stenosis. Mean gradient   (S): 9 mm Hg. Peak gradient (S): 16 mm Hg. Valve area (VTI): 2.39   cm^2. - Mitral valve: Mildly calcified annulus. There was mild   regurgitation. - Right ventricle: The  cavity size was mildly dilated. - Right atrium: Central venous pressure (est): 8 mm Hg. - Tricuspid valve: There was trivial regurgitation. - Pulmonary arteries: PA peak pressure: 46 mm Hg (S). - Pericardium, extracardiac: There was no pericardial effusion.  Laboratory Data:  Chemistry Recent Labs  Lab 07/28/17 0835 07/29/17 0429  NA 140 140  K 4.2 3.4*  CL 95* 98*  CO2 31 31  GLUCOSE 143* 103*  BUN 17 21*  CREATININE 1.55* 1.08  CALCIUM 8.3* 8.5*  GFRNONAA 42* >60  GFRAA 49* >60  ANIONGAP 14 11    Recent Labs  Lab 07/28/17 0835 07/29/17 0429  PROT 6.8 6.4*  ALBUMIN 2.5* 2.5*  AST 945* 953*  ALT 825* 993*  ALKPHOS 65 59  BILITOT 0.8 0.6   Hematology Recent Labs  Lab 07/28/17 0835 07/29/17 0429  WBC 17.1* 13.8*  RBC 4.03* 3.76*  HGB 9.8* 9.2*  HCT 33.7* 31.3*  MCV 83.6 83.2  MCH 24.3* 24.5*  MCHC 29.1* 29.4*  RDW 19.1* 19.1*  PLT 449* 427*   Cardiac Enzymes Recent Labs  Lab 07/28/17 0835 07/28/17 1300 07/28/17 1900 07/29/17 0028  TROPONINI 0.29* 0.71* 0.69* 0.60*   No results for input(s): TROPIPOC in the last 168 hours.  BNP Recent Labs  Lab 07/28/17 0835  BNP 162.0*  DDimer No results for input(s): DDIMER in the last 168 hours.  Radiology/Studies:  Ct Head Wo Contrast  Result Date: 07/28/2017 CLINICAL DATA:  Found unresponsive EXAM: CT HEAD WITHOUT CONTRAST TECHNIQUE: Contiguous axial images were obtained from the base of the skull through the vertex without intravenous contrast. COMPARISON:  09/24/2015 FINDINGS: Brain: There is atrophy and chronic small vessel disease changes. No acute intracranial abnormality. Specifically, no hemorrhage, hydrocephalus, mass lesion, acute infarction, or significant intracranial injury. Vascular: No hyperdense vessel or unexpected calcification. Skull: No acute calvarial abnormality. Sinuses/Orbits: Visualized paranasal sinuses and mastoids clear. Orbital soft tissues unremarkable. Other: None IMPRESSION: No  acute intracranial abnormality. Atrophy, chronic microvascular disease. Electronically Signed   By: Rolm Baptise M.D.   On: 07/28/2017 09:08   Dg Chest Port 1 View  Result Date: 07/28/2017 CLINICAL DATA:  Altered mental status.  Shortness of breath. EXAM: PORTABLE CHEST 1 VIEW COMPARISON:  Radiograph of July 16, 2017. FINDINGS: Stable cardiomegaly with mild central pulmonary vascular congestion. No pneumothorax or pleural effusion is noted. Mild bibasilar subsegmental atelectasis or infiltrates is noted. Bony thorax is unremarkable. IMPRESSION: Stable bibasilar subsegmental atelectasis or infiltrates. Stable cardiomegaly with central pulmonary vascular congestion. Electronically Signed   By: Marijo Conception, M.D.   On: 07/28/2017 08:02   US Abdomen Limited Ruq  Result Date: 07/28/2017 CLINICAL DATA:  Right upper quadrant pain EXAM: ULTRASOUND ABDOMEN LIMITED RIGHT UPPER QUADRANT COMPARISON:  CT abdomen and pelvis April 23, 2017 FINDINGS: Gallbladder: No gallstones or wall thickening visualized. There is no pericholecystic fluid. No sonographic Murphy sign noted by sonographer. Common bile duct: Diameter: 3 mm. No intrahepatic or extrahepatic biliary duct dilatation. Liver: No focal lesion identified. Within normal limits in parenchymal echogenicity. Portal vein is patent on color Doppler imaging with normal direction of blood flow towards the liver. IMPRESSION: Study within normal limits. Electronically Signed   By: Lowella Grip III M.D.   On: 07/28/2017 11:13     Assessment & Plan    1. Elevated Troponin  - presented to [facility] after being found to unresponsive by nursing staff at [facility]. His mental status has improved and he is A&Ox2 during today's encounter. Unable to contribute to PMH but denies any current pain. Has chronic hypoxic respiratory failure (on 4-5L [place] at baseline, on 8L [facility] currently). - Initial troponin 0.29 with repeat values of 0.71, 0.69, and 0.60. EKG shows sinus  tachycardia, HR 106, with no acute ST or T-wave changes. Echocardiogram shows a preserved EF of 65% to 70%, no regional wall motion abnormalities, mild MR, and trivial TR with PA peak pressure elevated to 46 mm Hg (S). - overall difficult to collect history but he denies any current anginal symptoms. No history of CAD by review of Epic and Care Everywhere. Troponin elevation likely secondary to demand ischemia in the setting of his acute illness. Would not plan for further ischemic evaluation in the setting of his echo showing a preserved EF and due to his multiple medical comorbidities. Can consider addition of ASA once taking PO medications. No statin due to elevated LFT's.    2. Acute Encephalopathy - felt to be secondary to polypharmacy and UTI. Improved this AM and A&Ox2.  - has been started on Ceftriaxone.  - per admitting team.  3. AKI - creatinine elevated to 1.55 on admission, improved to 1.08 this AM.   4. Elevated LFT's - AST at 953, ALT 993. RUQ US showed no acute findings. PTA Statin held. - further evaluation  per admitting team.   5. Bladder Cancer - being followed by [facility]. Has been undergoing palliative radiation.    For questions or updates, please contact [place] Please consult [email or web address] for contact info under Cardiology/STEMI.  Signed, Erma Heritage, PA-C 07/29/2017, 9:39 AM Pager: [phone]  Attending note Patient seen and discussed with PA Ahmed Prima, I agree with her documentation above. 77 yo male history of HL, HTN, blader cancer, COPD on home O2, anemia admitted with AMS.    K 4.2 Cr 1.55 (up from 1.07), AST 945 ALT 825 Alb 2.5 WBC 17.1 Hgb 9.8 Plt 449 BNP 162 Lactic acid 1.3 TSH 0.299  Trop 0.29-->0.71-->0.69-->0.6--> RUQ [place] negative CT head no acute process CXR stable bibasilar infiltrates vs atelecatisis. Pulm congestion 07/2017 echo LVEF 65-70%, no WMAs, cannot eval diastolic function, mild Mr EKG SR, no ischemic changes  Patient with  trop peak 0.71. Evidence of systemic illness with WBC 17 on admission, LFTs in 900s, along with AKI though renal function improved with IVFs.   At this time suspect demand ischemia due to likely underlying infection based on WBC. Recent echo with hyperdynamic LVEF and no WMAs. Would not consider ischemic testing at this time. With his advanced comorbidities including COPD and bladder cancer in general not a great candidate for ischemic testing, particularly in absence of cardiopulmonary symptoms and likely alternative etiology for his trop leak being demand ischemia. Consider ASA if not contraindicated.   We will sign off inpatient care.    Carlyle Dolly MD

## 2017-07-29 NOTE — Clinical Social Work Note (Signed)
Clinical Social Work Assessment  Patient Details  Name: [name] [name] MRN: [id number] Date of Birth: 12/08/1940  Date of referral:  07/29/17               Reason for consult:  Facility Placement, Discharge Planning                Permission sought to share information with:    Permission granted to share information::     Name::        Agency::     Relationship::     Contact Information:     Housing/Transportation Living arrangements for the past 2 months:  [place], [place] of Information:  Facility Patient Interpreter Needed:  None Criminal Activity/Legal Involvement Pertinent to Current Situation/Hospitalization:  No - Comment as needed Significant Relationships:  Adult Children Lives with:  Facility Resident Do you feel safe going back to the place where you live?  Yes Need for family participation in patient care:  Yes (Comment)  Care giving concerns: Pt is a long term care resident at [facility]   Social Worker assessment / plan: Pt is a 77 year old male referred to CSW as he is admitted from a SNF. Pt has been a resident at [facility] for approximately one month. He uses a wheelchair but can stand and pivot on his best days. Pt needs assistance with all ADLs. Palliative Care is consulting here. Family asking if pt can transfer to [facility]. [facility] does not have long term care beds available at this time. Per MD, pt may be stable for dc tomorrow. Will follow and assist with dc planning.  Employment status:  Retired Forensic scientist:  Medicaid In [place] PT Recommendations:  Not assessed at this time Information / Referral to community resources:     Patient/Family's Response to care: Pt and family appear to be accepting of care.   Patient/Family's Understanding of and Emotional Response to Diagnosis, Current Treatment, and Prognosis: It is unclear if pt/family fully understand diagnosis/prognosis. Palliative  Care APNP is working with pt and family to educate on pt's condition and likely outcomes. Family would like pt to transfer to [facility] but this is not going to be an option.   Emotional Assessment Appearance:  Appears stated age Attitude/Demeanor/Rapport:    Affect (typically observed):  Unable to Assess Orientation:  Oriented to Self, Oriented to Place Alcohol / Substance use:  Not Applicable Psych involvement (Current and /or in the community):  No (Comment)  Discharge Needs  Concerns to be addressed:  Discharge Planning Concerns Readmission within the last 30 days:  Yes Current discharge risk:  None Barriers to Discharge:  No Barriers Identified   Shade Flood, LCSW 07/29/2017, 10:37 AM

## 2017-07-29 NOTE — Progress Notes (Addendum)
PROGRESS NOTE    [name] [name]  YOV:[id number] DOB: 04/06/1941 DOA: 07/28/2017 PCP: [name], MD   Brief Narrative:   [name] [name] is a 77 y.o. male with medical history significant for dyslipidemia, hypertension, bladder cancer/hematuria with recent initiation of radiation treatment, and COPD on home 4-5 L nasal cannula with multiple, recent hospital admissions who was recently discharged on 07/17/2017 with anemia as well as volume overload.  He returns today with decreased responsiveness over the last 2 days.  He has been admitted with acute encephalopathy suspected to be related to polypharmacy coupled with renal and hepatic dysfunction.  He has also been started on Rocephin for possible UTI on account of chronic Foley catheter use.  He has noted elevated troponin levels likely related to demand ischemia is evaluated by cardiology.  He has elevated liver enzymes with negative workup noted thus far.  AK I is improving.  Patient overall with poor prognosis and palliative care has been consulted.   Assessment & Plan:   Principal Problem:   Encephalopathy acute Active Problems:   Essential hypertension   Bladder tumor   COPD (chronic obstructive pulmonary disease) (HCC)   Acute on chronic respiratory failure with hypoxia (HCC)   HLD (hyperlipidemia)   AKI (acute kidney injury) ([place])   DNR (do not resuscitate) discussion   1. Acute encephalopathy-likely multifactorial.    Patient is overall becoming more awake and alert and improving.  Therefore, transfer to telemetry floor today.  I believe that much of this is related to polypharmacy in the setting of liver and renal dysfunction and therefore, I will hold his current medications.    AK I is improving with IV fluid.  Rocephin has been initiated empirically for possible UTI, but this may be unlikely.  Urine cultures pending.  Advance diet as tolerated since patient is more alert. 2. Acute on chronic hypoxemic respiratory failure.     Start to wean oxygen today.  Patient does not currently appear volume overloaded and much of this may be related to his current mentation and poor respiratory drive.  Continue to monitor closely and wean down to his usual baseline of 4-5 L. 3. AK I-likely prerenal.    This is improving.  Continue IV fluid hydration.  Repeat a.m. labs and avoid nephrotoxic agents. Strict I/Os. 4. Elevated troponin-likely secondary to demand ischemia.    [place] cardiology consultation with no current need for intervention..  No sign of ACS currently noted and EKG within normal limits.  Cardiology consultation appreciated. Recent Echo 07/17/17 with LVEF 60-65% and no WMAs. 5. Transaminitis.  Right upper quadrant ultrasound within normal limits.  Hepatitis panel negative.  We will also check Tylenol level. Hold statin and Tylenol containing products.  Follow CMP in a.m. 6. Hypertension with poor control.  Hydralazine IV as needed. 7. Bladder carcinoma.  Patient currently undergoing radiation treatments which I believe may be an appropriate given his overall prognosis.  Continue to monitor hematuria. 8. Anemia.  Currently stable.  Monitor repeat CBC and transfuse as needed.  SCDs due to ongoing hematuria.   DVT prophylaxis:SCDs Code Status: DNR Family Communication: None today Disposition Plan: Continue ongoing treatment of AKI and possible UTI and avoid home medications that is contributing to polypharmacy   Consultants:   Cardiology  Palliative Care  Procedures:   None  Antimicrobials:   Rocephin 4/15->   Subjective: Patient seen and evaluated today with no new acute complaints or concerns.  He is more awake and alert.  No acute concerns or events noted overnight.  Objective: Vitals:   07/29/17 0600 07/29/17 0700 07/29/17 0800 07/29/17 0944  BP: 139/74 (!) 173/66    Pulse: 84 89    Resp: (!) 27 (!) 30    Temp:   98 F (36.7 C)   TempSrc:   Oral   SpO2: 97% 97% 99% 98%  Weight:      Height:         Intake/Output Summary (Last 24 hours) at 07/29/2017 1026 Last data filed at 07/29/2017 0500 Gross per 24 hour  Intake 801.25 ml  Output 1300 ml  Net -498.75 ml   Filed Weights   07/28/17 1420 07/29/17 0500  Weight: 74.1 kg (163 lb 5.8 oz) 74.1 kg (163 lb 5.8 oz)    Examination:  General exam: Appears calm and comfortable  Respiratory system: Clear to auscultation. Respiratory effort normal.  Currently on nasal cannula with 10 L. Cardiovascular system: S1 & S2 heard, RRR. No JVD, murmurs, rubs, gallops or clicks. No pedal edema. Gastrointestinal system: Abdomen is nondistended, soft and nontender. No organomegaly or masses felt. Normal bowel sounds heard. Central nervous system: Alert and oriented. No focal neurological deficits. Extremities: Symmetric 5 x 5 power. Skin: No rashes, lesions or ulcers; Foley with dark urine, blood-tinged.    Data Reviewed: I have personally reviewed following labs and imaging studies  CBC: Recent Labs  Lab 07/28/17 0835 07/29/17 0429  WBC 17.1* 13.8*  NEUTROABS 14.9*  --   HGB 9.8* 9.2*  HCT 33.7* 31.3*  MCV 83.6 83.2  PLT 449* 585*   Basic Metabolic Panel: Recent Labs  Lab 07/28/17 0835 07/29/17 0429  NA 140 140  K 4.2 3.4*  CL 95* 98*  CO2 31 31  GLUCOSE 143* 103*  BUN 17 21*  CREATININE 1.55* 1.08  CALCIUM 8.3* 8.5*   GFR: Estimated Creatinine Clearance: 59.1 mL/min (by C-G formula based on SCr of 1.08 mg/dL). Liver Function Tests: Recent Labs  Lab 07/28/17 0835 07/29/17 0429  AST 945* 953*  ALT 825* 993*  ALKPHOS 65 59  BILITOT 0.8 0.6  PROT 6.8 6.4*  ALBUMIN 2.5* 2.5*   No results for input(s): LIPASE, AMYLASE in the last 168 hours. Recent Labs  Lab 07/28/17 1049  AMMONIA 14   Coagulation Profile: No results for input(s): INR, PROTIME in the last 168 hours. Cardiac Enzymes: Recent Labs  Lab 07/28/17 0835 07/28/17 1300 07/28/17 1900 07/29/17 0028  TROPONINI 0.29* 0.71* 0.69* 0.60*   BNP (last  3 results) No results for input(s): PROBNP in the last 8760 hours. HbA1C: No results for input(s): HGBA1C in the last 72 hours. CBG: Recent Labs  Lab 07/28/17 0729  GLUCAP 140*   Lipid Profile: No results for input(s): CHOL, HDL, LDLCALC, TRIG, CHOLHDL, LDLDIRECT in the last 72 hours. Thyroid Function Tests: Recent Labs    07/28/17 1300  TSH 0.299*   Anemia Panel: No results for input(s): VITAMINB12, FOLATE, FERRITIN, TIBC, IRON, RETICCTPCT in the last 72 hours. Sepsis Labs: Recent Labs  Lab 07/28/17 0835  LATICACIDVEN 1.3    Recent Results (from the past 240 hour(s))  Blood Culture (routine x 2)     Status: None (Preliminary result)   Collection Time: 07/28/17  8:35 AM  Result Value Ref Range Status   Specimen Description BLOOD LEFT ARM  Final   Special Requests   Final    BOTTLES DRAWN AEROBIC ONLY Blood Culture adequate volume   Culture   Final    NO  GROWTH < 24 HOURS Performed at [facility], [address] [place], [place] [address]    Report Status PENDING  Incomplete  Blood Culture (routine x 2)     Status: None (Preliminary result)   Collection Time: 07/28/17  8:35 AM  Result Value Ref Range Status   Specimen Description BLOOD LEFT WRIST  Final   Special Requests   Final    BOTTLES DRAWN AEROBIC AND ANAEROBIC Blood Culture results may not be optimal due to an inadequate volume of blood received in culture bottles   Culture   Final    NO GROWTH < 24 HOURS Performed at [facility], [address] [place], [place] [address]    Report Status PENDING  Incomplete         Radiology Studies: Ct Head Wo Contrast  Result Date: 07/28/2017 CLINICAL DATA:  Found unresponsive EXAM: CT HEAD WITHOUT CONTRAST TECHNIQUE: Contiguous axial images were obtained from the base of the skull through the vertex without intravenous contrast. COMPARISON:  09/24/2015 FINDINGS: Brain: There is atrophy and chronic small vessel disease changes. No acute intracranial abnormality.  Specifically, no hemorrhage, hydrocephalus, mass lesion, acute infarction, or significant intracranial injury. Vascular: No hyperdense vessel or unexpected calcification. Skull: No acute calvarial abnormality. Sinuses/Orbits: Visualized paranasal sinuses and mastoids clear. Orbital soft tissues unremarkable. Other: None IMPRESSION: No acute intracranial abnormality. Atrophy, chronic microvascular disease. Electronically Signed   By: Rolm Baptise M.D.   On: 07/28/2017 09:08   Dg Chest Port 1 View  Result Date: 07/28/2017 CLINICAL DATA:  Altered mental status.  Shortness of breath. EXAM: PORTABLE CHEST 1 VIEW COMPARISON:  Radiograph of July 16, 2017. FINDINGS: Stable cardiomegaly with mild central pulmonary vascular congestion. No pneumothorax or pleural effusion is noted. Mild bibasilar subsegmental atelectasis or infiltrates is noted. Bony thorax is unremarkable. IMPRESSION: Stable bibasilar subsegmental atelectasis or infiltrates. Stable cardiomegaly with central pulmonary vascular congestion. Electronically Signed   By: Marijo Conception, M.D.   On: 07/28/2017 08:02   US Abdomen Limited Ruq  Result Date: 07/28/2017 CLINICAL DATA:  Right upper quadrant pain EXAM: ULTRASOUND ABDOMEN LIMITED RIGHT UPPER QUADRANT COMPARISON:  CT abdomen and pelvis April 23, 2017 FINDINGS: Gallbladder: No gallstones or wall thickening visualized. There is no pericholecystic fluid. No sonographic Murphy sign noted by sonographer. Common bile duct: Diameter: 3 mm. No intrahepatic or extrahepatic biliary duct dilatation. Liver: No focal lesion identified. Within normal limits in parenchymal echogenicity. Portal vein is patent on color Doppler imaging with normal direction of blood flow towards the liver. IMPRESSION: Study within normal limits. Electronically Signed   By: Lowella Grip III M.D.   On: 07/28/2017 11:13      Scheduled Meds: . feeding supplement (ENSURE ENLIVE)  237 mL Oral BID BM  . fluticasone  1 spray Each  Nare Daily  . potassium chloride  40 mEq Oral Once   Continuous Infusions: . cefTRIAXone (ROCEPHIN)  IV       LOS: 1 day    Time spent: 30 minutes    Charliee Krenz Darleen Crocker, DO [facility] Pager [phone]  If 7PM-7AM, please contact night-coverage [email or web address] Password [email or web address] 07/29/2017, 10:26 AM

## 2017-07-30 DIAGNOSIS — R7401 Elevation of levels of liver transaminase levels: Secondary | ICD-10-CM

## 2017-07-30 DIAGNOSIS — R0902 Hypoxemia: Secondary | ICD-10-CM | POA: Diagnosis not present

## 2017-07-30 DIAGNOSIS — D494 Neoplasm of unspecified behavior of bladder: Secondary | ICD-10-CM | POA: Diagnosis not present

## 2017-07-30 DIAGNOSIS — G9340 Encephalopathy, unspecified: Secondary | ICD-10-CM | POA: Diagnosis not present

## 2017-07-30 DIAGNOSIS — N179 Acute kidney failure, unspecified: Secondary | ICD-10-CM

## 2017-07-30 DIAGNOSIS — I10 Essential (primary) hypertension: Secondary | ICD-10-CM | POA: Diagnosis not present

## 2017-07-30 DIAGNOSIS — E876 Hypokalemia: Secondary | ICD-10-CM | POA: Diagnosis not present

## 2017-07-30 DIAGNOSIS — J418 Mixed simple and mucopurulent chronic bronchitis: Secondary | ICD-10-CM

## 2017-07-30 DIAGNOSIS — R464 Slowness and poor responsiveness: Secondary | ICD-10-CM | POA: Diagnosis not present

## 2017-07-30 DIAGNOSIS — G9349 Other encephalopathy: Secondary | ICD-10-CM | POA: Diagnosis not present

## 2017-07-30 DIAGNOSIS — R740 Nonspecific elevation of levels of transaminase and lactic acid dehydrogenase [LDH]: Secondary | ICD-10-CM

## 2017-07-30 LAB — COMPREHENSIVE METABOLIC PANEL
ALK PHOS: 72 U/L (ref 38–126)
ALT: 715 U/L — ABNORMAL HIGH (ref 17–63)
ANION GAP: 10 (ref 5–15)
AST: 297 U/L — ABNORMAL HIGH (ref 15–41)
Albumin: 2.6 g/dL — ABNORMAL LOW (ref 3.5–5.0)
BUN: 12 mg/dL (ref 6–20)
CALCIUM: 8.8 mg/dL — AB (ref 8.9–10.3)
CHLORIDE: 97 mmol/L — AB (ref 101–111)
CO2: 35 mmol/L — AB (ref 22–32)
CREATININE: 0.85 mg/dL (ref 0.61–1.24)
Glucose, Bld: 122 mg/dL — ABNORMAL HIGH (ref 65–99)
Potassium: 3.4 mmol/L — ABNORMAL LOW (ref 3.5–5.1)
SODIUM: 142 mmol/L (ref 135–145)
Total Bilirubin: 0.5 mg/dL (ref 0.3–1.2)
Total Protein: 6.7 g/dL (ref 6.5–8.1)

## 2017-07-30 LAB — URINE CULTURE
Culture: NO GROWTH
Special Requests: NORMAL

## 2017-07-30 LAB — CBC
HCT: 33.7 % — ABNORMAL LOW (ref 39.0–52.0)
HEMOGLOBIN: 9.8 g/dL — AB (ref 13.0–17.0)
MCH: 24.4 pg — AB (ref 26.0–34.0)
MCHC: 29.1 g/dL — ABNORMAL LOW (ref 30.0–36.0)
MCV: 84 fL (ref 78.0–100.0)
PLATELETS: 343 10*3/uL (ref 150–400)
RBC: 4.01 MIL/uL — AB (ref 4.22–5.81)
RDW: 19 % — ABNORMAL HIGH (ref 11.5–15.5)
WBC: 14.4 10*3/uL — AB (ref 4.0–10.5)

## 2017-07-30 LAB — MAGNESIUM: MAGNESIUM: 1.8 mg/dL (ref 1.7–2.4)

## 2017-07-30 MED ORDER — ALPRAZOLAM 1 MG PO TABS: 1.0000 mg | ORAL_TABLET | Freq: Three times a day (TID) | ORAL | 0 refills | Status: DC | PRN

## 2017-07-30 MED ORDER — CEFDINIR 300 MG PO CAPS
300.0000 mg | ORAL_CAPSULE | Freq: Two times a day (BID) | ORAL | Status: DC
  Administered 2017-07-30: 300 mg via ORAL
  Filled 2017-07-30: qty 1

## 2017-07-30 MED ORDER — CEFDINIR 300 MG PO CAPS: 300.0000 mg | ORAL_CAPSULE | Freq: Two times a day (BID) | ORAL | Status: AC

## 2017-07-30 MED ORDER — CEFUROXIME AXETIL 250 MG PO TABS
250.0000 mg | ORAL_TABLET | Freq: Two times a day (BID) | ORAL | Status: DC
  Filled 2017-07-30 (×5): qty 1

## 2017-07-30 MED ORDER — OXYCODONE HCL 5 MG PO TABS: 5.0000 mg | ORAL_TABLET | Freq: Four times a day (QID) | ORAL | 0 refills | Status: DC | PRN

## 2017-07-30 NOTE — Discharge Summary (Addendum)
Physician Discharge Summary  [name] [name] XTG:[id number] DOB: [birth year] DOA: 07/28/2017  PCP: [name] Beach, MD  Admit date: 07/28/2017 Discharge date: 07/30/2017  Time spent: 35 minutes  Recommendations for Outpatient Follow-up:  1. Symptomatic management and comfort care 2. No further hospitalization   Discharge Diagnoses:  Principal Problem:   Encephalopathy acute Active Problems:   Essential hypertension   Bladder tumor   COPD (chronic obstructive pulmonary disease) (HCC)   Acute on chronic respiratory failure with hypoxia (HCC)   HLD (hyperlipidemia)   AKI (acute kidney injury) ([place])   DNR (do not resuscitate) discussion   Encounter for hospice care discussion   Hypoxemia   Transaminitis Mild hypokalemia  Discharge Condition: stable and comfortable. Will discharge back to [place] for symptomatic management and comfort care. Hospice will follow patient at SNF.  Diet recommendation: comfort feeding; watching sodium intake.  Filed Weights   07/28/17 1420 07/29/17 0500  Weight: 74.1 kg (163 lb 5.8 oz) 74.1 kg (163 lb 5.8 oz)    History of present illness:  As per H&P written by Dr. Manuella [name] on 07/28/17 77 y.o. male with medical history significant for dyslipidemia, hypertension, bladder cancer/hematuria with recent initiation of radiation treatment, and COPD on home 4-5 L nasal cannula with multiple, recent hospital admissions who was recently discharged on 07/17/2017 with anemia as well as volume overload.  He returns today with decreased responsiveness over the last 2 days.  He was noted to have normal blood glucose levels and was supposedly given some Narcan by EMS with mild improvement in alertness.  The patient cannot give any history due to his mental status at this time and daughter is at bedside.   ED Course: Vital signs demonstrate some sinus tachycardia and elevated blood pressure readings.  Laboratory data reveals leukocytosis of 17,000, creatinine of 1.55  with usual baseline of 1.07, and troponin 0.29.  He is noted to have hemoglobin of 9.8 which appears stable.  LFTs are noted to be quite elevated.  Lactic acid is 1.3.  Urine analysis demonstrates some bacteria with moderate leukocytes which appears chronic, but patient does have chronic Foley.  Cardiology called by ED physician who will follow patient while here due to elevated troponin level.  Chest x-ray with stable cardiomegaly, CT head negative, right upper quadrant ultrasound negative.  He has been started on some IV fluid as well as Rocephin.   Hospital Course:  1. Acute encephalopathy/UTI-likely multifactorial.   Patient is overall more awake and alert and stable/comfortbale. Will complete antibiotics treatment with 3 more days of Cefdinir; following GOC discussion plan is to transition gears into symptomatic management and comfort care. No further hospitalization, work up or plan intervention.  2. Acute on chronic hypoxemic respiratory failure. no signs of fluid overload. Comfortable with [place] 4-5L. No further intervention or work up wanted. Plan is for symptomatic management and comfort care.  3. AKI- likely due to dehydration and potential flow obstruction/UTI. Improved with IVF's, foley placement and decrease in the use of nephrotoxic agents.  No further blood work anticipated. Continue foley.  4. Elevated troponin-likely secondary to demand ischemia.   [place] cardiology consultation with no current need for intervention. Plan is to focus on comfort management and symptomatic approach.  5. Transaminitis. Right upper quadrant ultrasound within normal limits. Hepatitis panel negative. most likely associated with infection. No further blood work up anticipated.  6. Hypertension: continue current antihypertensive regimen. 7. Bladder carcinoma. no further radiation. Keep foley in place to prevent flow obstruction. Plan  is for symptomatic management and comfort care. Hospice to follow  patient at SNF. 8. Anemia. Currently stable. no further CBC or intervention anticipated. Will continue iron supplementation. 9. Anxiety, chronic pain and end of life care: patient discharge on xanax and PRN oxycodone. Plan is for comfort care and symptomatic management. 10. Mild Hypokalemia: no further blood work up anticipated. Plan is for comfort care.    Procedures:  See below for x-ray reports.  Consultations:  Palliative Care   Cardiology   Discharge Exam: Vitals:   07/30/17 0701 07/30/17 1344  BP: (!) 177/97 (!) 154/79  Pulse: 80 85  Resp: 20 19  Temp: 98.3 F (36.8 C) 97.7 F (36.5 C)  SpO2: 98% 100%   General exam: Appears calm and comfortable, afebrile and in no distress. Reported mild ongoing lower back discomfort.  Respiratory system: Clear to auscultation. Respiratory effort normal.  Currently on nasal cannula with 8 L. Cardiovascular system: S1 & S2 heard, RRR. No JVD, murmurs, rubs, gallops or clicks. No pedal edema. Gastrointestinal system: Abdomen is nondistended, soft and nontender. No organomegaly or masses felt. Normal bowel sounds heard. No guarding  Central nervous system: Alert and oriented. No focal neurological deficits. Following commands and answering questions appropriately  Extremities: Symmetric 5 x 5 power. Skin: No rashes, lesions or ulcers;  Urology: Foley catheter in place, with blood tinge/pinkish urine.  Discharge Instructions   Discharge Instructions    Discharge instructions   Complete by:  As directed    Complete current antibiotic therapy as instructed Hospice to follow patient at skilled nursing facility with intention of symptomatic management and comfort care No future hospitalizations (unless unable to keep patient comfortable at facility and no beds and residential hospice home)     Allergies as of 07/30/2017      Reactions   Metrizamide Hives   Codeine    Iohexol Hives    Code: HIVES, Desc: PT STATES HE BROKE OUT IN  HIVES AND RASH AFTER CT NECK EARLY SEPT 2011; NO RESP PROBLEMS; NEEDS PRE-MEDS; MKS, Onset Date: 71245809   Ivp Dye [iodinated Diagnostic Agents] Hives   Other    Broccoli, Cauliflower       Medication List    STOP taking these medications   atorvastatin 20 MG tablet Commonly known as:  LIPITOR   fluticasone 50 MCG/ACT nasal spray Commonly known as:  FLONASE   furosemide 20 MG tablet Commonly known as:  LASIX   levofloxacin 750 MG tablet Commonly known as:  LEVAQUIN   loratadine 10 MG tablet Commonly known as:  CLARITIN   mirtazapine 15 MG tablet Commonly known as:  REMERON   moxifloxacin 400 MG tablet Commonly known as:  AVELOX   multivitamin with minerals Tabs tablet   NITROSTAT 0.4 MG SL tablet Generic drug:  nitroGLYCERIN     TAKE these medications   acetaminophen 325 MG tablet Commonly known as:  TYLENOL Take 650 mg by mouth every 6 (six) hours as needed for headache.   albuterol (2.5 MG/3ML) 0.083% nebulizer solution Commonly known as:  PROVENTIL Take 3 mLs (2.5 mg total) by nebulization every 4 (four) hours as needed. For shortness of breath What changed:  Another medication with the same name was removed. Continue taking this medication, and follow the directions you see here.   ALPRAZolam 1 MG tablet Commonly known as:  XANAX Take 1 tablet (1 mg total) by mouth 3 (three) times daily as needed for anxiety.   cefdinir 300 MG capsule Commonly known  as:  OMNICEF Take 1 capsule (300 mg total) by mouth every 12 (twelve) hours for 3 days.   citalopram 20 MG tablet Commonly known as:  CELEXA Take 20 mg by mouth daily.   DULoxetine 60 MG capsule Commonly known as:  CYMBALTA Take 1 capsule (60 mg total) by mouth daily.   fluticasone furoate-vilanterol 200-25 MCG/INH Aepb Commonly known as:  BREO ELLIPTA Inhale 1 puff into the lungs daily.   iron polysaccharides 150 MG capsule Commonly known as:  NIFEREX Take 150 mg by mouth daily.   montelukast 10  MG tablet Commonly known as:  SINGULAIR Take 10 mg by mouth at bedtime.   omeprazole 20 MG capsule Commonly known as:  PRILOSEC Take 20 mg by mouth daily.   oxybutynin 5 MG tablet Commonly known as:  DITROPAN Take 5 mg by mouth 3 (three) times daily.   oxyCODONE 5 MG immediate release tablet Commonly known as:  Oxy IR/ROXICODONE Take 1 tablet (5 mg total) by mouth every 6 (six) hours as needed for severe pain. What changed:    when to take this  reasons to take this   prochlorperazine 10 MG tablet Commonly known as:  COMPAZINE Take 10 mg by mouth as directed. Patient takes one tablet by mouth in the morning every Monday, Tuesday, Wednesday, Thursday, and Friday for nausea one hour prior to radiation treatments   roflumilast 500 MCG Tabs tablet Commonly known as:  DALIRESP Take 1 Tablet by mouth once daily   senna-docusate 8.6-50 MG tablet Commonly known as:  Senokot-S Take 1 tablet by mouth 2 (two) times daily.   umeclidinium bromide 62.5 MCG/INH Aepb Commonly known as:  INCRUSE ELLIPTA Inhale 1 puff into the lungs daily.   vitamin B-12 500 MCG tablet Commonly known as:  CYANOCOBALAMIN Take 1,000 mcg by mouth daily.      Allergies  Allergen Reactions  . Metrizamide Hives  . Codeine   . Iohexol Hives     Code: HIVES, Desc: PT STATES HE BROKE OUT IN HIVES AND RASH AFTER CT NECK EARLY SEPT 2011; NO RESP PROBLEMS; NEEDS PRE-MEDS; MKS, Onset Date: [id number]   . Ivp Dye [Iodinated Diagnostic Agents] Hives  . Other     Broccoli, Cauliflower    Follow-up Information    [name] Beach, MD. Schedule an appointment as soon as possible for a visit in 10 day(s).   Specialty:  Family Medicine Contact information: [place] [address] [place] [address] [phone]          The results of significant diagnostics from this hospitalization (including imaging, microbiology, ancillary and laboratory) are listed below for reference.    Significant  Diagnostic Studies: Ct Head Wo Contrast  Result Date: 07/28/2017 CLINICAL DATA:  Found unresponsive EXAM: CT HEAD WITHOUT CONTRAST TECHNIQUE: Contiguous axial images were obtained from the base of the skull through the vertex without intravenous contrast. COMPARISON:  09/24/2015 FINDINGS: Brain: There is atrophy and chronic small vessel disease changes. No acute intracranial abnormality. Specifically, no hemorrhage, hydrocephalus, mass lesion, acute infarction, or significant intracranial injury. Vascular: No hyperdense vessel or unexpected calcification. Skull: No acute calvarial abnormality. Sinuses/Orbits: Visualized paranasal sinuses and mastoids clear. Orbital soft tissues unremarkable. Other: None IMPRESSION: No acute intracranial abnormality. Atrophy, chronic microvascular disease. Electronically Signed   By: Rolm Baptise M.D.   On: 07/28/2017 09:08   Dg Chest Port 1 View  Result Date: 07/28/2017 CLINICAL DATA:  Altered mental status.  Shortness of breath. EXAM: PORTABLE CHEST 1 VIEW  COMPARISON:  Radiograph of July 16, 2017. FINDINGS: Stable cardiomegaly with mild central pulmonary vascular congestion. No pneumothorax or pleural effusion is noted. Mild bibasilar subsegmental atelectasis or infiltrates is noted. Bony thorax is unremarkable. IMPRESSION: Stable bibasilar subsegmental atelectasis or infiltrates. Stable cardiomegaly with central pulmonary vascular congestion. Electronically Signed   By: Marijo Conception, M.D.   On: 07/28/2017 08:02   Dg Chest Port 1 View  Result Date: 07/16/2017 CLINICAL DATA:  Shortness of breath. Recent pneumonia. History of COPD. EXAM: PORTABLE CHEST 1 VIEW COMPARISON:  Chest radiograph July 04, 2017 FINDINGS: Similar interstitial and alveolar airspace opacities most conspicuous in RIGHT lower lobe. No pleural effusion. Stable cardiomegaly. No pneumothorax. Soft tissue planes and included osseous structures are nonsuspicious. IMPRESSION: Similar interstitial and to  lesser extent alveolar airspace opacities seen with pulmonary edema and/or pneumonia. Stable cardiomegaly. Electronically Signed   By: Elon Alas M.D.   On: 07/16/2017 13:47   Dg Chest Port 1 View  Result Date: 07/04/2017 CLINICAL DATA:  Sudden onset shortness of breath, COPD, emphysema, smoker EXAM: PORTABLE CHEST 1 VIEW COMPARISON:  06/21/2017 FINDINGS: Background COPD/emphysema noted. Mild superimposed and asymmetric diffuse interstitial opacities suggesting a component of early edema. Minor basilar atelectasis. No large effusion or pneumothorax. Trachea is midline. Normal heart size. Trachea is midline. No osseous abnormality. 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 Electronically Signed   By: Jerilynn Mages.  Shick M.D.   On: 07/04/2017 07:22   US Abdomen Limited Ruq  Result Date: 07/28/2017 CLINICAL DATA:  Right upper quadrant pain EXAM: ULTRASOUND ABDOMEN LIMITED RIGHT UPPER QUADRANT COMPARISON:  CT abdomen and pelvis April 23, 2017 FINDINGS: Gallbladder: No gallstones or wall thickening visualized. There is no pericholecystic fluid. No sonographic Murphy sign noted by sonographer. Common bile duct: Diameter: 3 mm. No intrahepatic or extrahepatic biliary duct dilatation. Liver: No focal lesion identified. Within normal limits in parenchymal echogenicity. Portal vein is patent on color Doppler imaging with normal direction of blood flow towards the liver. IMPRESSION: Study within normal limits. Electronically Signed   By: Lowella Grip III M.D.   On: 07/28/2017 11:13    Microbiology: Recent Results (from the past 240 hour(s))  Urine culture     Status: None   Collection Time: 07/28/17  7:41 AM  Result Value Ref Range Status   Specimen Description   Final    URINE, CATHETERIZED Performed at [facility], [address] [place], [place] [address]    Special Requests   Final    Normal Performed at [facility], [address], [place], [place]  [address]    Culture   Final    NO GROWTH Performed at [facility], [place] [address] [place], [place] [address]    Report Status 07/30/2017 FINAL  Final  Blood Culture (routine x 2)     Status: None (Preliminary result)   Collection Time: 07/28/17  8:35 AM  Result Value Ref Range Status   Specimen Description BLOOD LEFT ARM  Final   Special Requests   Final    BOTTLES DRAWN AEROBIC ONLY Blood Culture adequate volume   Culture   Final    NO GROWTH 2 DAYS Performed at [facility], [address], [place], [place] [address]    Report Status PENDING  Incomplete  Blood Culture (routine x 2)     Status: None (Preliminary result)   Collection Time: 07/28/17  8:35 AM  Result Value Ref Range Status   Specimen Description BLOOD LEFT  WRIST  Final   Special Requests   Final    BOTTLES DRAWN AEROBIC AND ANAEROBIC Blood Culture results may not be optimal due to an inadequate volume of blood received in culture bottles   Culture   Final    NO GROWTH 2 DAYS Performed at [facility], [address], [place], [place] [address]    Report Status PENDING  Incomplete     Labs: Basic Metabolic Panel: Recent Labs  Lab 07/28/17 0835 07/29/17 0429 07/30/17 0552  NA 140 140 142  K 4.2 3.4* 3.4*  CL 95* 98* 97*  CO2 31 31 35*  GLUCOSE 143* 103* 122*  BUN 17 21* 12  CREATININE 1.55* 1.08 0.85  CALCIUM 8.3* 8.5* 8.8*  MG  --   --  1.8   Liver Function Tests: Recent Labs  Lab 07/28/17 0835 07/29/17 0429 07/30/17 0552  AST 945* 953* 297*  ALT 825* 993* 715*  ALKPHOS 65 59 72  BILITOT 0.8 0.6 0.5  PROT 6.8 6.4* 6.7  ALBUMIN 2.5* 2.5* 2.6*    Recent Labs  Lab 07/28/17 1049  AMMONIA 14   CBC: Recent Labs  Lab 07/28/17 0835 07/29/17 0429 07/30/17 0552  WBC 17.1* 13.8* 14.4*  NEUTROABS 14.9*  --   --   HGB 9.8* 9.2* 9.8*  HCT 33.7* 31.3* 33.7*  MCV 83.6 83.2 84.0  PLT 449* 427* 343   Cardiac Enzymes: Recent Labs  Lab 07/28/17 0835 07/28/17 1300 07/28/17 1900  07/29/17 0028  TROPONINI 0.29* 0.71* 0.69* 0.60*   BNP: BNP (last 3 results) Recent Labs    07/04/17 0725 07/16/17 1333 07/28/17 0835  BNP 123.0* 261.0* 162.0*   CBG: Recent Labs  Lab 07/28/17 0729  GLUCAP 140*   Signed:  Barton Dubois MD.  [facility] 07/30/2017, 2:58 PM

## 2017-07-30 NOTE — NC FL2 (Signed)
[place] LEVEL OF CARE SCREENING TOOL     IDENTIFICATION  Patient Name: [name] [name] Birthdate: 01-25-1941 Sex: male Admission Date (Current Location): 07/28/2017  [facility] and [place] Number:  [employer] and Address:  [place] [address], [place]      Provider Number: [id number]  Attending Physician Name and Address:  Barton Dubois, MD  Relative Name and Phone Number:       Current Level of Care: Hospital Recommended Level of Care: [place] Prior Approval Number:    Date Approved/Denied:   PASRR Number: [id number]  Discharge Plan: SNF    Current Diagnoses: Patient Active Problem List   Diagnosis Date Noted  . Encounter for hospice care discussion   . Encephalopathy acute 07/28/2017  . Palliative care by specialist   . Goals of care, counseling/discussion   . DNR (do not resuscitate) discussion   . Pulmonary edema, acute ([place]) 07/16/2017  . Acute blood loss anemia 07/16/2017  . Sepsis ([place]) 07/04/2017  . Hypokalemia 07/04/2017  . Leukocytosis 07/04/2017  . Urothelial carcinoma ([place]) 06/25/2017  . AKI (acute kidney injury) ([place])   . Severe anemia 04/22/2017  . Shortness of breath   . Acute on chronic respiratory failure ([place]) 10/24/2015  . Pressure ulcer 09/25/2015  . Respiratory failure ([place]) 09/24/2015  . Acute on chronic respiratory failure with hypercapnia ([place])   . Frequent falls 09/21/2015  . HLD (hyperlipidemia) 08/18/2015  . Chronic anemia 08/18/2015  . COPD exacerbation ([place]) 08/18/2015  . Chronic pain syndrome 05/01/2015  . Chronic respiratory failure with hypoxia ([place])   . Acute on chronic respiratory failure with hypoxia ([place]) 02/25/2014  . COPD (chronic obstructive pulmonary disease) ([place]) 02/09/2014  . Hyperglycemia, drug-induced 03/26/2013  . Chronic back pain   . Anxiety 03/13/2012  . Hyperlipidemia 03/13/2012  . BPH (benign prostatic hyperplasia) 10/01/2011   . Tobacco abuse 09/29/2011  . Bladder tumor 09/28/2011  . PULMONARY NODULE 12/27/2009  . Essential hypertension 12/26/2009  . OSTEOARTHRITIS 12/26/2009    Orientation RESPIRATION BLADDER Height & Weight     Self, Situation, Place  O2(see discharge summary) External catheter Weight: 163 lb 5.8 oz (74.1 kg) Height:  5\' 9"  (175.3 cm)  BEHAVIORAL SYMPTOMS/MOOD NEUROLOGICAL BOWEL NUTRITION STATUS      Continent Diet(see discharge summary)  AMBULATORY STATUS COMMUNICATION OF NEEDS Skin   Extensive Assist Verbally Normal                       Personal Care Assistance Level of Assistance    Bathing Assistance: Limited assistance Feeding assistance: Independent Dressing Assistance: Limited assistance     Functional Limitations Info    Sight Info: Adequate Hearing Info: Adequate Speech Info: Adequate    SPECIAL CARE FACTORS FREQUENCY                       Contractures Contractures Info: Not present    Additional Factors Info    Code Status Info: DNR Allergies Info: Metrizamide, Codeine, Lohexol, IVP dye Psychotropic Info: Celexa, Cymbalta, Remeron         Current Medications (07/30/2017):  This is the current hospital active medication list Current Facility-Administered Medications  Medication Dose Route Frequency Provider Last Rate Last Dose  . cefUROXime (CEFTIN) tablet 250 mg  250 mg Oral BID WC Barton Dubois, MD      . feeding supplement (ENSURE ENLIVE) (ENSURE ENLIVE) liquid 237 mL  237  mL Oral BID BM Shah, Pratik D, DO   237 mL at 07/30/17 1009  . fluticasone (FLONASE) 50 MCG/ACT nasal spray 1 spray  1 spray Each Nare Daily Shah, Pratik D, DO   1 spray at 07/30/17 1009  . hydrALAZINE (APRESOLINE) injection 10 mg  10 mg Intravenous Q4H PRN [name], Pratik D, DO      . LORazepam (ATIVAN) injection 0.5 mg  0.5 mg Intravenous Q6H PRN [name], Pratik D, DO   0.5 mg at 07/29/17 2215  . ondansetron (ZOFRAN) tablet 4 mg  4 mg Oral Q6H PRN [name], Pratik D, DO       Or   . ondansetron (ZOFRAN) injection 4 mg  4 mg Intravenous Q6H PRN Heath Lark D, DO         Discharge Medications: Please see discharge summary for a list of discharge medications.  Relevant Imaging Results:  Relevant Lab Results:   Additional Information    Shade Flood, LCSW

## 2017-07-30 NOTE — Clinical Social Work Note (Signed)
LCSW following. Per MD, pt stable for dc today. Spoke with pt to determine if he could make decisions for himself. Pt appears alert to self, place, and situation. Discussed pt's discharge plans. Pt stated that he wasn't sure if he wanted to go back to [facility] or not. Pt's son would like him to go to [facility] where his wife works and where he can visit patient regularly. Discussed this with patient who stated that he doesn't want to go to [facility]. Patient states he wants to go where his daughter [name] wants him to go. Patient states "I don't care where my son wants me to go, I want to go where my daughter wants me to go."   Spoke with pt's daughter, [name], by phone to discuss. Per [name], she wants pt to go back to [facility]. She states that she spoke with [name] at the SNF and they are going to get pt a different bed and a different room and make sure that pt does not get up without assistance.  Message left for [name] that pt will return to the facility today. Hospice is being recommended by Palliative Care APNP. SNF to refer for hospice.

## 2017-07-30 NOTE — Care Management (Signed)
Patient Information   Patient Name Jatinder, Mcdonagh ([id number]) Sex Male DOB [birth year]  Room Bed  A309 A309-01  Patient Demographics   Address [place] [address] Phone [phone] (Home) *Preferred*  Patient Ethnicity & Race   Ethnic Group Patient Race  Not Hispanic or Latino White or Caucasian  Emergency Contact(s)   Name Relation Home Work [place] Daughter [phone]  [phone]  webb, frances Sister [phone]    phillip, maffei   [phone]  Documents on File    Status Date Received Description  Documents for the Patient  EMR Medication Summary Not Received    EMR Immunization Summary Not Received    EMR Problem Summary Not Received    EMR Patient Summary Not Received    [place] Received 07/12/10   [place] E-Signature HIPAA Notice of Privacy Received 06/21/10   [place] E-Signature HIPAA Notice of Privacy Spanish Received 00/86/76   Driver's License Not Received    Advance Directives/Living Will/HCPOA/POA Not Received    Driver's License Not Received  exp 2013  Insurance Card Not Received  old card  [place] Not Received    Financial Application Not Received    HIM ROI Authorization Not Received (Expired)  [place], ADDITIONAL DOCUMENTATION REQUESTED  HIM ROI Authorization  11/03/13   HIM ROI Authorization  02/16/14   HIM ROI Authorization (Expired) 02/18/14 Patient in office for hospital follow up. He was admitted last week. We do not have any records from his last stay.   Release of Information  03/08/14   Release of Information  19/50/93   Financial Application   FA application scanned  Other Photo ID Not Received    Advanced Beneficiary Notice (ABN) Not Received    E-Signature AOB Spanish Not Received    Insurance Card Received 08/18/15 [name] [id number]  Driver's License Received 24/58/09 exp 2018  AMB HH/NH/Hospice  12/25/14 ORDER  [facility]  HIM ROI Authorization (Expired) 05/24/15   Release of Information  05/25/15   HIM ROI Authorization  05/30/15 Patient Request  AMB HH/NH/Hospice  08/18/15 PHYSICIAN VERBAL ORDER [facility]  AMB HH/NH/Hospice  08/22/15 PHYSICIAN VERBAL ORDER [facility]  AMB Correspondence  09/08/15 CLIENT MEDICATION/DRUG INTERACTION REPORT [facility]  AMB HH/NH/Hospice  09/08/15 05/17 VERBAL ORDER [facility]  AMB HH/NH/Hospice  10/03/15 6/17 ORDER [facility]  AMB HH/NH/Hospice  98/33/82 CERTIFICATION POC [facility]  AMB HH/NH/Hospice  09/29/15 PHYSICIAN VERBAL ORDER [facility]  AMB Intake Forms/Questionnaires  10/11/15 6/17 THNCM [facility] REFERRAL  HIM ROI Authorization  10/26/15 Patient Request  AMB HH/NH/Hospice  09/30/15 ORDER [facility]  AMB HH/NH/Hospice  50/53/97 CERTIFICATION&POC [facility]  AMB HH/NH/Hospice  11/27/15 ORDER [facility]  HIM ROI Authorization  01/25/16   Release of Information  02/02/16   AMB Provider Completed Forms  02/02/16 REQUEST FOR ASSESSMENT [facility]  HIM ROI Authorization  02/13/16 Dr Dione Housekeeper  HIM ROI Authorization  03/28/16 Dione Housekeeper, MD  HIM ROI Authorization  07/18/17 CONTINUITY OF CARE  HIM ROI Authorization  07/18/17 CONTINUITY OF CARE  HIM ROI Authorization  07/22/17 [facility] Authorization  07/22/17   AMB Provider Completed Forms (Deleted) 02/02/16 REQUEST FOR ASSESSMENT [facility]  HIM Release of Information Output  07/18/17 Requested records  HIM Release of Information Output  07/22/17 Requested records  HIM Release of Information Output  07/22/17 Requested records  Documents for the Encounter  AOB (Assignment of Insurance Benefits) Not Received    E-signature AOB Unable to Obtain 07/28/17   MEDICARE RIGHTS Not Received    E-signature Medicare Rights Unable to Obtain 07/28/17   Cardiac Monitoring Strip  Shift Summary Received 07/28/17   Ultrasound Received 07/28/17   ED Patient Billing Extract   ED PB Billing Extract  Cardiac Monitoring Strip Received 07/28/17   EKG Received 07/28/17   Admission Information   Attending Provider Admitting Provider Admission Type Admission Date/Time  Barton Dubois, MD Rodena Goldmann, DO Emergency 07/28/17 0726  Discharge Date Hospital Service Auth/Cert Status Service Area   Internal Medicine Incomplete [facility]  Unit Room/Bed Admission Status   AP-DEPT 300 A309/A309-01 Admission (Confirmed)   Admission   Complaint  [facility] Account   Name Acct ID Class Status Primary Coverage  Jove, Beyl [id number] Inpatient Open UNITED HEALTHCARE MEDICARE - UHC MEDICARE      Guarantor Account (for [place] [id number])   Name Relation to Pt Service Area Active? Acct Type  Marchia Bond Self CHSA Yes Personal/Family  Address Phone    [place], [place] [address] [phone])        Coverage Information (for Hospital Account [id number])   F/O Payor/Plan Precert #  [facility] [place] #  [place], [facility] [id number]  Address Phone  [address] [place] [address]

## 2017-07-30 NOTE — Care Management Important Message (Signed)
Important Message  Patient Details  Name: [name] [name] MRN: [id number] Date of Birth: [birth year]   Medicare Important Message Given:  Yes    Anesha Hackert, Chauncey [place], RN 07/30/2017, 9:51 AM

## 2017-07-30 NOTE — Progress Notes (Signed)
Report called to [facility] nursing facility.

## 2017-07-30 NOTE — Care Management (Addendum)
Referral sent to [facility] as requested. Patient discharging back to [facility] today.

## 2017-08-02 LAB — CULTURE, BLOOD (ROUTINE X 2)
CULTURE: NO GROWTH
Culture: NO GROWTH
Special Requests: ADEQUATE

## 2017-08-06 ENCOUNTER — Encounter (HOSPITAL_COMMUNITY): Payer: Self-pay

## 2017-08-06 ENCOUNTER — Emergency Department (HOSPITAL_COMMUNITY): Payer: Medicare Other

## 2017-08-06 ENCOUNTER — Inpatient Hospital Stay (HOSPITAL_COMMUNITY)
Admission: EM | Admit: 2017-08-06 | Discharge: 2017-08-11 | DRG: 193 | Disposition: A | Discharge status: Skilled Nursing Facility | Payer: Medicare Other | Attending: Internal Medicine | Admitting: Internal Medicine

## 2017-08-06 DIAGNOSIS — N401 Enlarged prostate with lower urinary tract symptoms: Secondary | ICD-10-CM | POA: Diagnosis present

## 2017-08-06 DIAGNOSIS — I10 Essential (primary) hypertension: Secondary | ICD-10-CM | POA: Diagnosis present

## 2017-08-06 DIAGNOSIS — G9340 Encephalopathy, unspecified: Secondary | ICD-10-CM | POA: Diagnosis present

## 2017-08-06 DIAGNOSIS — J449 Chronic obstructive pulmonary disease, unspecified: Secondary | ICD-10-CM | POA: Diagnosis present

## 2017-08-06 DIAGNOSIS — Z9981 Dependence on supplemental oxygen: Secondary | ICD-10-CM

## 2017-08-06 DIAGNOSIS — R319 Hematuria, unspecified: Secondary | ICD-10-CM | POA: Diagnosis present

## 2017-08-06 DIAGNOSIS — Z79899 Other long term (current) drug therapy: Secondary | ICD-10-CM

## 2017-08-06 DIAGNOSIS — Z515 Encounter for palliative care: Secondary | ICD-10-CM | POA: Diagnosis not present

## 2017-08-06 DIAGNOSIS — J189 Pneumonia, unspecified organism: Principal | ICD-10-CM | POA: Diagnosis present

## 2017-08-06 DIAGNOSIS — Z841 Family history of disorders of kidney and ureter: Secondary | ICD-10-CM

## 2017-08-06 DIAGNOSIS — Z906 Acquired absence of other parts of urinary tract: Secondary | ICD-10-CM

## 2017-08-06 DIAGNOSIS — Z9049 Acquired absence of other specified parts of digestive tract: Secondary | ICD-10-CM

## 2017-08-06 DIAGNOSIS — Z91041 Radiographic dye allergy status: Secondary | ICD-10-CM

## 2017-08-06 DIAGNOSIS — Z91018 Allergy to other foods: Secondary | ICD-10-CM

## 2017-08-06 DIAGNOSIS — T380X5A Adverse effect of glucocorticoids and synthetic analogues, initial encounter: Secondary | ICD-10-CM | POA: Diagnosis present

## 2017-08-06 DIAGNOSIS — F17210 Nicotine dependence, cigarettes, uncomplicated: Secondary | ICD-10-CM | POA: Diagnosis present

## 2017-08-06 DIAGNOSIS — Z825 Family history of asthma and other chronic lower respiratory diseases: Secondary | ICD-10-CM

## 2017-08-06 DIAGNOSIS — Z79891 Long term (current) use of opiate analgesic: Secondary | ICD-10-CM

## 2017-08-06 DIAGNOSIS — Z8619 Personal history of other infectious and parasitic diseases: Secondary | ICD-10-CM

## 2017-08-06 DIAGNOSIS — E876 Hypokalemia: Secondary | ICD-10-CM | POA: Diagnosis present

## 2017-08-06 DIAGNOSIS — J439 Emphysema, unspecified: Secondary | ICD-10-CM | POA: Diagnosis present

## 2017-08-06 DIAGNOSIS — Z66 Do not resuscitate: Secondary | ICD-10-CM | POA: Diagnosis present

## 2017-08-06 DIAGNOSIS — R739 Hyperglycemia, unspecified: Secondary | ICD-10-CM | POA: Diagnosis present

## 2017-08-06 DIAGNOSIS — J9621 Acute and chronic respiratory failure with hypoxia: Secondary | ICD-10-CM | POA: Diagnosis present

## 2017-08-06 DIAGNOSIS — F419 Anxiety disorder, unspecified: Secondary | ICD-10-CM | POA: Diagnosis present

## 2017-08-06 DIAGNOSIS — G9341 Metabolic encephalopathy: Secondary | ICD-10-CM | POA: Diagnosis present

## 2017-08-06 DIAGNOSIS — A419 Sepsis, unspecified organism: Secondary | ICD-10-CM | POA: Diagnosis present

## 2017-08-06 DIAGNOSIS — F329 Major depressive disorder, single episode, unspecified: Secondary | ICD-10-CM | POA: Diagnosis present

## 2017-08-06 DIAGNOSIS — C689 Malignant neoplasm of urinary organ, unspecified: Secondary | ICD-10-CM | POA: Diagnosis present

## 2017-08-06 DIAGNOSIS — C679 Malignant neoplasm of bladder, unspecified: Secondary | ICD-10-CM | POA: Diagnosis present

## 2017-08-06 DIAGNOSIS — R338 Other retention of urine: Secondary | ICD-10-CM | POA: Diagnosis present

## 2017-08-06 DIAGNOSIS — Z960 Presence of urogenital implants: Secondary | ICD-10-CM | POA: Diagnosis present

## 2017-08-06 DIAGNOSIS — K219 Gastro-esophageal reflux disease without esophagitis: Secondary | ICD-10-CM | POA: Diagnosis present

## 2017-08-06 DIAGNOSIS — E8770 Fluid overload, unspecified: Secondary | ICD-10-CM | POA: Diagnosis present

## 2017-08-06 DIAGNOSIS — Z8711 Personal history of peptic ulcer disease: Secondary | ICD-10-CM

## 2017-08-06 DIAGNOSIS — J441 Chronic obstructive pulmonary disease with (acute) exacerbation: Secondary | ICD-10-CM | POA: Diagnosis present

## 2017-08-06 DIAGNOSIS — Z885 Allergy status to narcotic agent status: Secondary | ICD-10-CM

## 2017-08-06 DIAGNOSIS — J181 Lobar pneumonia, unspecified organism: Secondary | ICD-10-CM

## 2017-08-06 DIAGNOSIS — Y95 Nosocomial condition: Secondary | ICD-10-CM | POA: Diagnosis present

## 2017-08-06 DIAGNOSIS — E785 Hyperlipidemia, unspecified: Secondary | ICD-10-CM | POA: Diagnosis present

## 2017-08-06 DIAGNOSIS — Z888 Allergy status to other drugs, medicaments and biological substances status: Secondary | ICD-10-CM

## 2017-08-06 HISTORY — DX: Malignant (primary) neoplasm, unspecified: C80.1

## 2017-08-06 LAB — CBC WITH DIFFERENTIAL/PLATELET
BASOS ABS: 0 10*3/uL (ref 0.0–0.1)
BASOS PCT: 0 %
EOS ABS: 0.2 10*3/uL (ref 0.0–0.7)
Eosinophils Relative: 1 %
HEMATOCRIT: 35.2 % — AB (ref 39.0–52.0)
HEMOGLOBIN: 10.2 g/dL — AB (ref 13.0–17.0)
Lymphocytes Relative: 17 %
Lymphs Abs: 3.7 10*3/uL (ref 0.7–4.0)
MCH: 23.8 pg — ABNORMAL LOW (ref 26.0–34.0)
MCHC: 29 g/dL — ABNORMAL LOW (ref 30.0–36.0)
MCV: 82.1 fL (ref 78.0–100.0)
Monocytes Absolute: 1.9 10*3/uL (ref 0.1–1.0)
Monocytes Relative: 9 %
NEUTROS ABS: 15.9 10*3/uL (ref 1.7–7.7)
NEUTROS PCT: 73 %
Platelets: 296 10*3/uL (ref 150–400)
RBC: 4.29 MIL/uL (ref 4.22–5.81)
RDW: 20.6 % — ABNORMAL HIGH (ref 11.5–15.5)
WBC: 21.7 10*3/uL — AB (ref 4.0–10.5)

## 2017-08-06 LAB — BASIC METABOLIC PANEL
Anion gap: 15 (ref 5–15)
BUN: 29 mg/dL — ABNORMAL HIGH (ref 6–20)
CO2: 32 mmol/L (ref 22–32)
Calcium: 9 mg/dL (ref 8.9–10.3)
Chloride: 92 mmol/L — ABNORMAL LOW (ref 101–111)
Creatinine, Ser: 1.15 mg/dL (ref 0.61–1.24)
GFR calc Af Amer: >60 mL/min (ref >60)
Glucose, Bld: 140 mg/dL — ABNORMAL HIGH (ref 65–99)
POTASSIUM: 2.9 mmol/L — AB (ref 3.5–5.1)
Sodium: 139 mmol/L (ref 135–145)

## 2017-08-06 LAB — TROPONIN I: TROPONIN I: 0.03 ng/mL — AB (ref <0.03)

## 2017-08-06 LAB — BRAIN NATRIURETIC PEPTIDE: B Natriuretic Peptide: 170 pg/mL — ABNORMAL HIGH (ref 0.0–100.0)

## 2017-08-06 MED ORDER — VANCOMYCIN HCL IN DEXTROSE 1-5 GM/200ML-% IV SOLN
1000.0000 mg | Freq: Once | INTRAVENOUS | Status: AC
  Administered 2017-08-07: 1000 mg via INTRAVENOUS
  Filled 2017-08-06: qty 200

## 2017-08-06 MED ORDER — SODIUM CHLORIDE 0.9 % IV SOLN
2.0000 g | Freq: Once | INTRAVENOUS | Status: AC
  Administered 2017-08-07: 2 g via INTRAVENOUS
  Filled 2017-08-06: qty 2

## 2017-08-06 NOTE — ED Notes (Signed)
Date and time results received: 08/06/17 2048  Test: Troponin Critical Value: 0.03  Name of Provider Notified: Long  Orders Received? Or Actions Taken?: no new orders at this time

## 2017-08-06 NOTE — ED Notes (Signed)
Call pt's daughter [name] with disposition. [phone]

## 2017-08-06 NOTE — ED Provider Notes (Signed)
Emergency Department Provider Note   I have reviewed the triage vital signs and the nursing notes.   HISTORY  Chief Complaint Pneumonia (CXR today)   HPI [name] [name] is a 77 y.o. male with PMH of COPD, GERD, HLD, HTN, and asthma resents to the emergency department for evaluation of pneumonia.  The patient is currently at [facility] after recent admission for acute encephalopathy.  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  He began having fever today which point a chest x-ray was done showing a pneumonia.  Staff received orders for antibiotics but the patient's daughter at bedside insisted that he be transferred to the hospital for further evaluation.  The patient denies any shortness of breath currently.  No increased oxygen requirement or chest pain.  Family noted the patient has been confused or mentally they think because of medication that he has been taking. Patient is DNR and was previously in the hospice program but family states was discharged recently.   Level 5 caveat: confusion.    Past Medical History:  Diagnosis Date  . Anxiety 03/13/2012  . Asthma   . 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  Marland Kitchen BPH (benign prostatic hyperplasia) 10/01/2011   Per cystoscopy  . Chronic anxiety   . Chronic back pain   . COPD (chronic obstructive pulmonary disease) (HCC)    a. on 4-5L [place] at baseline  . Depression   . Emphysema lung ([place])   . GERD (gastroesophageal reflux disease)   . Headache(784.0)   . History of stomach ulcers   . Hypercholesteremia   . Hyperlipidemia 03/13/2012  . Pulmonary nodule 09/2011   Stable appearance  . Tobacco abuse 09/29/2011    Patient Active Problem List   Diagnosis Date Noted  . Hypoxemia   . Transaminitis   . Encounter for hospice care discussion   . Encephalopathy acute 07/28/2017  . Palliative care by specialist   . Goals of care, counseling/discussion   . DNR (do not  resuscitate) discussion   . Pulmonary edema, acute ([place]) 07/16/2017  . Acute blood loss anemia 07/16/2017  . Sepsis ([place]) 07/04/2017  . Hypokalemia 07/04/2017  . Leukocytosis 07/04/2017  . Urothelial carcinoma ([place]) 06/25/2017  . AKI (acute kidney injury) ([place])   . Severe anemia 04/22/2017  . Shortness of breath   . Acute on chronic respiratory failure ([place]) 10/24/2015  . Pressure ulcer 09/25/2015  . Respiratory failure ([place]) 09/24/2015  . Acute on chronic respiratory failure with hypercapnia ([place])   . Frequent falls 09/21/2015  . HLD (hyperlipidemia) 08/18/2015  . Chronic anemia 08/18/2015  . COPD exacerbation ([place]) 08/18/2015  . Chronic pain syndrome 05/01/2015  . Chronic respiratory failure with hypoxia ([place])   . Acute on chronic respiratory failure with hypoxia ([place]) 02/25/2014  . COPD (chronic obstructive pulmonary disease) ([place]) 02/09/2014  . Hyperglycemia, drug-induced 03/26/2013  . Chronic back pain   . Anxiety 03/13/2012  . Hyperlipidemia 03/13/2012  . BPH (benign prostatic hyperplasia) 10/01/2011  . Tobacco abuse 09/29/2011  . Bladder tumor 09/28/2011  . PULMONARY NODULE 12/27/2009  . Essential hypertension 12/26/2009  . OSTEOARTHRITIS 12/26/2009    Past Surgical History:  Procedure Laterality Date  . CHOLECYSTECTOMY    . CYSTOSCOPY  09/30/2011   Procedure: CYSTOSCOPY FLEXIBLE;  Surgeon: Marissa Nestle, MD;  Location: [facility];  Service: Urology;  Laterality: N/A;  . FRACTURE SURGERY     R ring finger, pin placed  .  TRANSURETHRAL RESECTION OF BLADDER TUMOR  10/01/2011   Procedure: TRANSURETHRAL RESECTION OF BLADDER TUMOR (TURBT);  Surgeon: Marissa Nestle, MD;  Location: [facility];  Service: Urology;  Laterality: N/A;    Current Outpatient Rx  . Order #: [id number] Class: Historical Med  . Order #: [id number] Class: Normal  . Order #: [id number] Class: Print  . Order #: [id number] Class: Historical Med  . Order #: [id number] Class: Normal  . Order #: [id number] Class:  Historical Med  . Order #: [id number] Class: Historical Med  . Order #: [id number] Class: Historical Med  . Order #: [id number] Class: Historical Med  . Order #: [id number] Class: Print  . Order #: [id number] Class: Historical Med  . Order #: [id number] Class: Historical Med  . Order #: [id number] Class: Historical Med  . Order #: [id number] Class: Historical Med    Allergies Metrizamide; Codeine; Iohexol; Ivp dye [iodinated diagnostic agents]; and Other  Family History  Problem Relation Age of Onset  . Kidney failure Mother   . Emphysema Brother        was not close to family; does not truly know their medical problems.  . Emphysema Brother   . Emphysema Brother   . Cancer Neg Hx     Social History Social History   Tobacco Use  . Smoking status: Current Every Day Smoker    Packs/day: 0.50    Years: 58.00    Pack years: 29.00    Types: Cigarettes  . Smokeless tobacco: Never Used  Substance Use Topics  . Alcohol use: No  . Drug use: No    Review of Systems  Constitutional: Positive fever.  Eyes: No visual changes. ENT: No sore throat. Cardiovascular: Denies chest pain. Respiratory: Denies shortness of breath. Gastrointestinal: No abdominal pain.  No nausea, no vomiting.  No diarrhea.  No constipation. Genitourinary: Negative for dysuria. Musculoskeletal: Negative for back pain. Skin: Negative for rash. Neurological: Negative for headaches, focal weakness or numbness.  10-point ROS otherwise negative.  ____________________________________________   PHYSICAL EXAM:  VITAL SIGNS: ED Triage Vitals  Enc Vitals Group     BP 08/06/17 2017 127/80     Pulse Rate 08/06/17 2017 92     Resp 08/06/17 2017 (!) 26     Temp 08/06/17 2017 98 F (36.7 C)     Temp Source 08/06/17 2017 Oral     SpO2 08/06/17 2017 91 %     Pain Score 08/06/17 2024 0   Constitutional: Alert with some confusion noted. Chronically ill-appearing. No acute distress.  Eyes: Conjunctivae are normal. Head:  Atraumatic. Nose: No congestion/rhinnorhea. Mouth/Throat: Mucous membranes are moist.  Neck: No stridor.   Cardiovascular: Normal rate, regular rhythm. Good peripheral circulation. Grossly normal heart sounds.   Respiratory: Slight increased respiratory effort.  No retractions. Lungs with crackles bilaterally.  Gastrointestinal: Soft and nontender. No distention.  Musculoskeletal: No lower extremity tenderness nor edema. No gross deformities of extremities. Neurologic:  Normal speech and language. No gross focal neurologic deficits are appreciated.  Skin:  Skin is warm, dry and intact. No rash noted.  ____________________________________________   LABS (all labs ordered are listed, but only abnormal results are displayed)  Labs Reviewed  BASIC METABOLIC PANEL - Abnormal; Notable for the following components:      Result Value   Potassium 2.9 (*)    Chloride 92 (*)    Glucose, Bld 140 (*)    BUN 29 (*)    All other components within normal limits  CBC WITH DIFFERENTIAL/PLATELET - Abnormal; Notable for the following components:   WBC  21.7 (*)    Hemoglobin 10.2 (*)    HCT 35.2 (*)    MCH 23.8 (*)    MCHC 29.0 (*)    RDW 20.6 (*)    All other components within normal limits  TROPONIN I - Abnormal; Notable for the following components:   Troponin I 0.03 (*)    All other components within normal limits  BRAIN NATRIURETIC PEPTIDE - Abnormal; Notable for the following components:   B Natriuretic Peptide 170.0 (*)    All other components within normal limits  CULTURE, BLOOD (ROUTINE X 2)  CULTURE, BLOOD (ROUTINE X 2)  I-STAT CG4 LACTIC ACID, ED   ____________________________________________  RADIOLOGY  Dg Chest 2 View  Result Date: 08/06/2017 CLINICAL DATA:  Patient was diagnosed today with pneumonia at an outside facility. Here for re-evaluation. History of COPD and asthma. EXAM: CHEST - 2 VIEW COMPARISON:  07/28/2017 FINDINGS: Cardiac enlargement with mild pulmonary vascular  congestion. Diffuse interstitial and alveolar infiltrates may indicate pneumonia or edema. There is more dense infiltration in the left lower lung posteriorly suggesting superimposed pneumonia. No blunting of costophrenic angles. No pneumothorax. Mediastinal contours appear intact. Calcification of the aorta. IMPRESSION: 1. Cardiac enlargement with pulmonary vascular congestion and diffuse interstitial and alveolar infiltration suggesting edema. This could also represent multifocal pneumonia. 2. Focal area of more dense infiltration in the left lower lung posteriorly suggest superimposed pneumonia. Electronically Signed   By: Lucienne Capers M.D.   On: 08/06/2017 21:06    ____________________________________________   PROCEDURES  Procedure(s) performed:   .Critical Care Performed by: Margette Fast, MD Authorized by: Margette Fast, MD   Critical care provider statement:    Critical care time (minutes):  35   Critical care time was exclusive of:  Separately billable procedures and treating other patients and teaching time   Critical care was necessary to treat or prevent imminent or life-threatening deterioration of the following conditions:  Respiratory failure   Critical care was time spent personally by me on the following activities:  Blood draw for specimens, development of treatment plan with patient or surrogate, evaluation of patient's response to treatment, examination of patient, obtaining history from patient or surrogate, ordering and performing treatments and interventions, ordering and review of laboratory studies, ordering and review of radiographic studies, pulse oximetry, re-evaluation of patient's condition and review of old charts   I assumed direction of critical care for this patient from another provider in my specialty: no       ____________________________________________   INITIAL IMPRESSION / [place] / ED COURSE  Pertinent labs & imaging results  that were available during my care of the patient were reviewed by me and considered in my medical decision making (see chart for details).  Patient presents to the emergency department for evaluation of pneumonia.  Chest x-ray was performed today after patient developed fever.  He denies any shortness of breath, weakness, or increased oxygen requirement.  Chest x-ray here shows vascular congestion with possible overlying pneumonia.  11:30 PM Patient now with slight increased oxygen requirement.  He has oxygen in the mid 80s on 4 L nasal cannula.  Patient may require high flow nasal cannula.  No significant increased work of breathing.  He remains afebrile here.  Chest x-ray shows some concern for multifocal pneumonia, possibly HCAP given his multiple recent admissions and SNF placement.  Family was initially at bedside but is no longer here.  I am unable to reach them by phone to  discuss the test results and disposition plan.  Given the patient's now increased oxygen requirement above his baseline and multiple medical conditions plan for overnight admission with IV antibiotics and oxygen support.   Discussed patient's case with Hospitalist, Dr. [name] to request admission. Patient and family (if present) updated with plan. Care transferred to Hospitalist service.  I reviewed all nursing notes, vitals, pertinent old records, EKGs, labs, imaging (as available).  ____________________________________________  FINAL CLINICAL IMPRESSION(S) / ED DIAGNOSES  Final diagnoses:  Pneumonia  HCAP (healthcare-associated pneumonia)    MEDICATIONS GIVEN DURING THIS VISIT:  Medications  ceFEPIme (MAXIPIME) 2 g in sodium chloride 0.9 % 100 mL IVPB (has no administration in time range)  vancomycin (VANCOCIN) IVPB 1000 mg/200 mL premix (has no administration in time range)    Note:  This document was prepared using Dragon voice recognition software and may include unintentional dictation errors.  Nanda Quinton, MD Emergency Medicine    Meribeth Vitug, [facility], MD 08/06/17 [phone]

## 2017-08-06 NOTE — ED Triage Notes (Signed)
Pt arrives via [facility] from [facility].  Pt had a cxr today at the facility and diagnosed with pneumonia.  Antibiotics were ordered for the patient, but the family wishes for the patient to be evaluated here for same.  Pt denies complaints

## 2017-08-07 ENCOUNTER — Other Ambulatory Visit: Payer: Self-pay

## 2017-08-07 ENCOUNTER — Encounter (HOSPITAL_COMMUNITY): Payer: Self-pay | Admitting: *Deleted

## 2017-08-07 DIAGNOSIS — E876 Hypokalemia: Secondary | ICD-10-CM

## 2017-08-07 DIAGNOSIS — J9621 Acute and chronic respiratory failure with hypoxia: Secondary | ICD-10-CM

## 2017-08-07 DIAGNOSIS — J181 Lobar pneumonia, unspecified organism: Secondary | ICD-10-CM | POA: Diagnosis not present

## 2017-08-07 DIAGNOSIS — J189 Pneumonia, unspecified organism: Secondary | ICD-10-CM

## 2017-08-07 DIAGNOSIS — Z7189 Other specified counseling: Secondary | ICD-10-CM | POA: Diagnosis not present

## 2017-08-07 DIAGNOSIS — J441 Chronic obstructive pulmonary disease with (acute) exacerbation: Secondary | ICD-10-CM

## 2017-08-07 DIAGNOSIS — G9340 Encephalopathy, unspecified: Secondary | ICD-10-CM

## 2017-08-07 DIAGNOSIS — C689 Malignant neoplasm of urinary organ, unspecified: Secondary | ICD-10-CM

## 2017-08-07 DIAGNOSIS — Z515 Encounter for palliative care: Secondary | ICD-10-CM

## 2017-08-07 DIAGNOSIS — D494 Neoplasm of unspecified behavior of bladder: Secondary | ICD-10-CM

## 2017-08-07 LAB — URINALYSIS, COMPLETE (UACMP) WITH MICROSCOPIC
Bilirubin Urine: NEGATIVE
Glucose, UA: NEGATIVE mg/dL
KETONES UR: NEGATIVE mg/dL
Nitrite: NEGATIVE
PH: 6 (ref 5.0–8.0)
Protein, ur: 100 mg/dL — AB
RBC / HPF: >50 RBC/hpf — ABNORMAL HIGH (ref 0–5)
SPECIFIC GRAVITY, URINE: 1.015 (ref 1.005–1.030)

## 2017-08-07 LAB — CBC
HCT: 34.8 % — ABNORMAL LOW (ref 39.0–52.0)
Hemoglobin: 10.1 g/dL — ABNORMAL LOW (ref 13.0–17.0)
MCH: 23.9 pg — AB (ref 26.0–34.0)
MCHC: 29 g/dL — ABNORMAL LOW (ref 30.0–36.0)
MCV: 82.5 fL (ref 78.0–100.0)
PLATELETS: 291 10*3/uL (ref 150–400)
RBC: 4.22 MIL/uL (ref 4.22–5.81)
RDW: 20.9 % — ABNORMAL HIGH (ref 11.5–15.5)
WBC: 16.1 10*3/uL — AB (ref 4.0–10.5)

## 2017-08-07 LAB — MAGNESIUM: MAGNESIUM: 1.9 mg/dL (ref 1.7–2.4)

## 2017-08-07 LAB — BASIC METABOLIC PANEL
Anion gap: 11 (ref 5–15)
BUN: 24 mg/dL — ABNORMAL HIGH (ref 6–20)
CALCIUM: 8.9 mg/dL (ref 8.9–10.3)
CO2: 33 mmol/L — ABNORMAL HIGH (ref 22–32)
CREATININE: 1.01 mg/dL (ref 0.61–1.24)
Chloride: 95 mmol/L — ABNORMAL LOW (ref 101–111)
Glucose, Bld: 115 mg/dL — ABNORMAL HIGH (ref 65–99)
Potassium: 4 mmol/L (ref 3.5–5.1)
SODIUM: 139 mmol/L (ref 135–145)

## 2017-08-07 LAB — HEPATIC FUNCTION PANEL
ALBUMIN: 2.5 g/dL — AB (ref 3.5–5.0)
ALK PHOS: 63 U/L (ref 38–126)
ALT: 41 U/L (ref 17–63)
AST: 26 U/L (ref 15–41)
BILIRUBIN TOTAL: 0.8 mg/dL (ref 0.3–1.2)
Bilirubin, Direct: 0.1 mg/dL (ref 0.1–0.5)
Indirect Bilirubin: 0.7 mg/dL (ref 0.3–0.9)
Total Protein: 7.4 g/dL (ref 6.5–8.1)

## 2017-08-07 LAB — I-STAT CG4 LACTIC ACID, ED: Lactic Acid, Venous: 0.79 mmol/L (ref 0.5–1.9)

## 2017-08-07 LAB — PROCALCITONIN: Procalcitonin: 0.2 ng/mL

## 2017-08-07 MED ORDER — IPRATROPIUM-ALBUTEROL 0.5-2.5 (3) MG/3ML IN SOLN: 3.0000 mL | Freq: Four times a day (QID) | RESPIRATORY_TRACT | Status: DC | PRN

## 2017-08-07 MED ORDER — PANTOPRAZOLE SODIUM 40 MG PO TBEC
40.0000 mg | DELAYED_RELEASE_TABLET | Freq: Every day | ORAL | Status: DC
  Administered 2017-08-07 – 2017-08-11 (×5): 40 mg via ORAL
  Filled 2017-08-07 (×5): qty 1

## 2017-08-07 MED ORDER — POTASSIUM CHLORIDE 20 MEQ/15ML (10%) PO SOLN
40.0000 meq | Freq: Once | ORAL | Status: AC
  Administered 2017-08-07: 40 meq via ORAL
  Filled 2017-08-07: qty 30

## 2017-08-07 MED ORDER — SODIUM CHLORIDE 0.9 % IV SOLN
2.0000 g | Freq: Three times a day (TID) | INTRAVENOUS | Status: DC
  Administered 2017-08-08 – 2017-08-11 (×11): 2 g via INTRAVENOUS
  Filled 2017-08-07 (×16): qty 2

## 2017-08-07 MED ORDER — CITALOPRAM HYDROBROMIDE 20 MG PO TABS
20.0000 mg | ORAL_TABLET | Freq: Every day | ORAL | Status: DC
  Administered 2017-08-07 – 2017-08-11 (×5): 20 mg via ORAL
  Filled 2017-08-07 (×5): qty 1

## 2017-08-07 MED ORDER — ONDANSETRON HCL 4 MG PO TABS: 4.0000 mg | ORAL_TABLET | Freq: Four times a day (QID) | ORAL | Status: DC | PRN

## 2017-08-07 MED ORDER — OXYBUTYNIN CHLORIDE 5 MG PO TABS
5.0000 mg | ORAL_TABLET | Freq: Three times a day (TID) | ORAL | Status: DC
  Administered 2017-08-07 – 2017-08-11 (×13): 5 mg via ORAL
  Filled 2017-08-07 (×13): qty 1

## 2017-08-07 MED ORDER — ACETAMINOPHEN 325 MG PO TABS
650.0000 mg | ORAL_TABLET | Freq: Four times a day (QID) | ORAL | Status: DC | PRN
  Administered 2017-08-11: 650 mg via ORAL
  Filled 2017-08-07 (×2): qty 2

## 2017-08-07 MED ORDER — ENOXAPARIN SODIUM 40 MG/0.4ML ~~LOC~~ SOLN
40.0000 mg | SUBCUTANEOUS | Status: DC
  Administered 2017-08-07 – 2017-08-11 (×5): 40 mg via SUBCUTANEOUS
  Filled 2017-08-07 (×5): qty 0.4

## 2017-08-07 MED ORDER — ALPRAZOLAM 0.5 MG PO TABS
1.0000 mg | ORAL_TABLET | Freq: Three times a day (TID) | ORAL | Status: DC | PRN
  Administered 2017-08-08 – 2017-08-11 (×5): 1 mg via ORAL
  Filled 2017-08-07 (×5): qty 2

## 2017-08-07 MED ORDER — DULOXETINE HCL 30 MG PO CPEP
60.0000 mg | ORAL_CAPSULE | Freq: Every day | ORAL | Status: DC
  Administered 2017-08-07 – 2017-08-11 (×5): 60 mg via ORAL
  Filled 2017-08-07 (×5): qty 2

## 2017-08-07 MED ORDER — SODIUM CHLORIDE 0.9 % IV SOLN
2.0000 g | Freq: Once | INTRAVENOUS | Status: AC
  Administered 2017-08-07: 2 g via INTRAVENOUS
  Filled 2017-08-07: qty 2

## 2017-08-07 MED ORDER — MONTELUKAST SODIUM 10 MG PO TABS
10.0000 mg | ORAL_TABLET | Freq: Every day | ORAL | Status: DC
  Administered 2017-08-07 – 2017-08-10 (×5): 10 mg via ORAL
  Filled 2017-08-07 (×5): qty 1

## 2017-08-07 MED ORDER — LACTATED RINGERS IV SOLN
INTRAVENOUS | Status: AC
  Administered 2017-08-07: 03:00:00 via INTRAVENOUS

## 2017-08-07 MED ORDER — ROFLUMILAST 500 MCG PO TABS
500.0000 ug | ORAL_TABLET | Freq: Every day | ORAL | Status: DC
Start: 2017-08-07 — End: 2017-08-11
  Administered 2017-08-07 – 2017-08-11 (×5): 500 ug via ORAL
  Filled 2017-08-07 (×5): qty 1

## 2017-08-07 MED ORDER — ARFORMOTEROL TARTRATE 15 MCG/2ML IN NEBU
15.0000 ug | INHALATION_SOLUTION | Freq: Two times a day (BID) | RESPIRATORY_TRACT | Status: DC
  Administered 2017-08-07 – 2017-08-11 (×9): 15 ug via RESPIRATORY_TRACT
  Filled 2017-08-07 (×9): qty 2

## 2017-08-07 MED ORDER — ACETAMINOPHEN 650 MG RE SUPP: 650.0000 mg | Freq: Four times a day (QID) | RECTAL | Status: DC | PRN

## 2017-08-07 MED ORDER — IPRATROPIUM-ALBUTEROL 0.5-2.5 (3) MG/3ML IN SOLN
3.0000 mL | Freq: Four times a day (QID) | RESPIRATORY_TRACT | Status: DC
  Administered 2017-08-07 – 2017-08-11 (×15): 3 mL via RESPIRATORY_TRACT
  Filled 2017-08-07 (×15): qty 3

## 2017-08-07 MED ORDER — SENNOSIDES-DOCUSATE SODIUM 8.6-50 MG PO TABS
1.0000 | ORAL_TABLET | Freq: Two times a day (BID) | ORAL | Status: DC
  Administered 2017-08-07 – 2017-08-11 (×10): 1 via ORAL
  Filled 2017-08-07 (×10): qty 1

## 2017-08-07 MED ORDER — ACETAMINOPHEN 325 MG PO TABS
650.0000 mg | ORAL_TABLET | Freq: Four times a day (QID) | ORAL | Status: DC | PRN
  Administered 2017-08-07 – 2017-08-10 (×3): 650 mg via ORAL
  Filled 2017-08-07 (×2): qty 2

## 2017-08-07 MED ORDER — ONDANSETRON HCL 4 MG/2ML IJ SOLN: 4.0000 mg | Freq: Four times a day (QID) | INTRAMUSCULAR | Status: DC | PRN

## 2017-08-07 MED ORDER — VANCOMYCIN HCL IN DEXTROSE 750-5 MG/150ML-% IV SOLN
750.0000 mg | Freq: Two times a day (BID) | INTRAVENOUS | Status: DC
  Administered 2017-08-07 – 2017-08-09 (×5): 750 mg via INTRAVENOUS
  Filled 2017-08-07 (×9): qty 150

## 2017-08-07 MED ORDER — BUDESONIDE 0.5 MG/2ML IN SUSP
0.5000 mg | Freq: Two times a day (BID) | RESPIRATORY_TRACT | Status: DC
  Administered 2017-08-07 – 2017-08-11 (×9): 0.5 mg via RESPIRATORY_TRACT
  Filled 2017-08-07 (×9): qty 2

## 2017-08-07 MED ORDER — OXYCODONE HCL 5 MG PO TABS
5.0000 mg | ORAL_TABLET | Freq: Four times a day (QID) | ORAL | Status: DC | PRN
  Administered 2017-08-07 – 2017-08-08 (×2): 5 mg via ORAL
  Filled 2017-08-07 (×3): qty 1

## 2017-08-07 MED ORDER — POTASSIUM CHLORIDE 10 MEQ/100ML IV SOLN
10.0000 meq | INTRAVENOUS | Status: AC
Start: 2017-08-07 — End: 2017-08-07
  Administered 2017-08-07 (×4): 10 meq via INTRAVENOUS
  Filled 2017-08-07: qty 100

## 2017-08-07 MED ORDER — PROCHLORPERAZINE MALEATE 5 MG PO TABS
10.0000 mg | ORAL_TABLET | ORAL | Status: DC
  Administered 2017-08-08: 10 mg via ORAL
  Filled 2017-08-07 (×2): qty 2

## 2017-08-07 NOTE — Care Management Obs Status (Signed)
[place] NOTIFICATION   Patient Details  Name: [name] [name] MRN: [id number] Date of Birth: 1940/05/03   Medicare Observation Status Notification Given:  Yes    Sherald Barge, RN 08/07/2017, 1:18 PM

## 2017-08-07 NOTE — Progress Notes (Signed)
PROGRESS NOTE  [name] [name] RSW:[id number] DOB: 07/03/1940 DOA: 08/06/2017 PCP: [name], MD  Brief History:  77 year old male with a history of bladder cancer,COPD,  chronic respiratory failure on 4 L oxygen, hyperlipidemia, depression, hyperlipidemia presenting with fever at [facility].  Apparently, chest x-ray was performed and showed pneumonia.  Staff received orders for antibiotics but the patient's daughter at bedside insisted that he be transferred to the hospital for further evaluation. The patient has had numerous hospital admissions in the last 6 months.  Most recently, the patient was discharged from the hospital after a stay from 07/28/17 through 07/30/2017 for acute encephalopathy secondary to UTI.  Upon presentation, he was noted to have WBC 21.7.  He was started vancomycin and cefepime after blood cultures were obtained.    Assessment/Plan: Acute on chronic respiratory failure with hypoxia -Secondary to COPD exacerbation and HCAP -Normally on 4-5 L nasal cannula at home -Weaned oxygen back to baseline level -personally reviewed CXR--bibasilar opacities, chronic interstitial markings  HCAP -continue vanco and cefepime -check procalcitonin -follow blood cultures  COPD exacerbation -Very low functional reserve -Start duonebs -StartPulmicort -start brovana  Acute metabolic encephalopathy -secondary to infection -check UA -remains confused  Urinary retention/hematuria/high grade urothelial carcinoma -He has chronic indwellingFoley catheter -he was scheduled to start radiation to his bladder on 06/23/17, this has been delayed due to his numerous hospitalizations. -04/26/17--CYSTOSCOPY W/ FULGURATION / EVACUATION OF CLOTS, TURBT -Hemoglobin has remained stable  Drug induced hyperglycemia -check A1C--6.2 -due to steroids previously  [place] -consult palliative medicine -pt is DNR  Hypokalemia -replete -check  mag  Depression/Anxiety -continue home dose alprazolam, celexa, cymbalta    Disposition Plan:   SNF 2-3 days  Family Communication:  No Family at bedside--Total time spent 35 minutes.  Greater than 50% spent face to face counseling and coordinating care.  Consultants:  Palliative medicine  Code Status: DNR  DVT Prophylaxis:  [place] Lovenox   Procedures: As Listed in Progress Note Above  Antibiotics: Vanco 4/24>>> Cefepime 4/24>>    Subjective: Patient remains pleasantly confused.  He denies any chest pain, hemoptysis, nausea, vomiting, diarrhea, abdominal pain.  He has a low shortness of breath.  He denies any headache or neck pain.  Objective: Vitals:   08/07/17 0453 08/07/17 0454 08/07/17 0559 08/07/17 0602  BP: 121/85 119/79    Pulse: 98 98    Resp:      Temp: 97.7 F (36.5 C) 98.2 F (36.8 C)    TempSrc: Oral Oral    SpO2: 94% 94% (!) 88% 93%  Weight:    69.3 kg (152 lb 11.2 oz)  Height:    5\' 9"  (1.753 m)    Intake/Output Summary (Last 24 hours) at 08/07/2017 0827 Last data filed at 08/07/2017 0500 Gross per 24 hour  Intake 348.75 ml  Output -  Net 348.75 ml   Weight change:  Exam:   General:  Pt is alert, follows commands appropriately, not in acute distress; pleasantly confused  HEENT: No icterus, No thrush, No neck mass, [place]/AT  Cardiovascular: RRR, S1/S2, no rubs, no gallops  Respiratory: Bilateral expiratory wheeze.  Good air movement.  Bibasilar rales.  Abdomen: Soft/+BS, non tender, non distended, no guarding  Extremities: No edema, No lymphangitis, No petechiae, No rashes, no synovitis   Data Reviewed: I have personally reviewed following labs and imaging studies Basic Metabolic Panel: Recent Labs  Lab 08/06/17 2138 08/07/17 0629  NA 139 139  K  2.9* 4.0  CL 92* 95*  CO2 32 33*  GLUCOSE 140* 115*  BUN 29* 24*  CREATININE 1.15 1.01  CALCIUM 9.0 8.9  MG  --  1.9   Liver Function Tests: No results for input(s): AST, ALT, ALKPHOS,  BILITOT, PROT, ALBUMIN in the last 168 hours. No results for input(s): LIPASE, AMYLASE in the last 168 hours. No results for input(s): AMMONIA in the last 168 hours. Coagulation Profile: No results for input(s): INR, PROTIME in the last 168 hours. CBC: Recent Labs  Lab 08/06/17 2138 08/07/17 0629  WBC 21.7* 16.1*  NEUTROABS 15.9  --   HGB 10.2* 10.1*  HCT 35.2* 34.8*  MCV 82.1 82.5  PLT 296 291   Cardiac Enzymes: Recent Labs  Lab 08/06/17 2138  TROPONINI 0.03*   BNP: Invalid input(s): POCBNP CBG: No results for input(s): GLUCAP in the last 168 hours. HbA1C: No results for input(s): HGBA1C in the last 72 hours. Urine analysis:    Component Value Date/Time   COLORURINE YELLOW 07/28/2017 0741   APPEARANCEUR CLOUDY (A) 07/28/2017 0741   APPEARANCEUR Turbid (A) 01/25/2016 1407   LABSPEC 1.009 07/28/2017 0741   PHURINE 6.0 07/28/2017 0741   GLUCOSEU NEGATIVE 07/28/2017 0741   HGBUR LARGE (A) 07/28/2017 0741   BILIRUBINUR NEGATIVE 07/28/2017 0741   BILIRUBINUR Negative 01/25/2016 1407   KETONESUR NEGATIVE 07/28/2017 0741   PROTEINUR 100 (A) 07/28/2017 0741   UROBILINOGEN 0.2 05/04/2014 0956   NITRITE NEGATIVE 07/28/2017 0741   LEUKOCYTESUR MODERATE (A) 07/28/2017 0741   LEUKOCYTESUR 3+ (A) 01/25/2016 1407   Sepsis Labs: @LABRCNTIP (procalcitonin:4,lacticidven:4) ) Recent Results (from the past 240 hour(s))  Blood Culture (routine x 2)     Status: None   Collection Time: 07/28/17  8:35 AM  Result Value Ref Range Status   Specimen Description BLOOD LEFT ARM  Final   Special Requests   Final    BOTTLES DRAWN AEROBIC ONLY Blood Culture adequate volume   Culture   Final    NO GROWTH 5 DAYS Performed at [facility], [address], [place], [place] [address]    Report Status 08/02/2017 FINAL  Final  Blood Culture (routine x 2)     Status: None   Collection Time: 07/28/17  8:35 AM  Result Value Ref Range Status   Specimen Description BLOOD LEFT WRIST  Final    Special Requests   Final    BOTTLES DRAWN AEROBIC AND ANAEROBIC Blood Culture results may not be optimal due to an inadequate volume of blood received in culture bottles   Culture   Final    NO GROWTH 5 DAYS Performed at [facility], [address], [place], [place] [address]    Report Status 08/02/2017 FINAL  Final  Blood Culture (routine x 2)     Status: None (Preliminary result)   Collection Time: 08/07/17 12:07 AM  Result Value Ref Range Status   Specimen Description RIGHT ANTECUBITAL  Final   Special Requests   Final    BOTTLES DRAWN AEROBIC AND ANAEROBIC Blood Culture adequate volume   Culture   Final    NO GROWTH < 12 HOURS Performed at [facility], [address] [place], [place] [address]    Report Status PENDING  Incomplete  Blood Culture (routine x 2)     Status: None (Preliminary result)   Collection Time: 08/07/17 12:14 AM  Result Value Ref Range Status   Specimen Description LEFT ANTECUBITAL  Final   Special Requests   Final  BOTTLES DRAWN AEROBIC AND ANAEROBIC Blood Culture adequate volume   Culture   Final    NO GROWTH < 12 HOURS Performed at [facility], [address], [place], [place] [address]    Report Status PENDING  Incomplete     Scheduled Meds: . citalopram  20 mg Oral Daily  . DULoxetine  60 mg Oral Daily  . enoxaparin (LOVENOX) injection  40 mg Subcutaneous Q24H  . montelukast  10 mg Oral QHS  . oxybutynin  5 mg Oral TID  . pantoprazole  40 mg Oral Daily  . prochlorperazine  10 mg Oral Once per day on Mon Tue Wed Thu Fri  . roflumilast  500 mcg Oral Daily  . senna-docusate  1 tablet Oral BID   Continuous Infusions: . ceFEPime (MAXIPIME) IV    . ceFEPime (MAXIPIME) IV    . lactated ringers 75 mL/hr at 08/07/17 0301  . vancomycin      Procedures/Studies: Dg Chest 2 View  Result Date: 08/06/2017 CLINICAL DATA:  Patient was diagnosed today with pneumonia at an outside facility. Here for re-evaluation. History of COPD and asthma. EXAM:  CHEST - 2 VIEW COMPARISON:  07/28/2017 FINDINGS: Cardiac enlargement with mild pulmonary vascular congestion. Diffuse interstitial and alveolar infiltrates may indicate pneumonia or edema. There is more dense infiltration in the left lower lung posteriorly suggesting superimposed pneumonia. No blunting of costophrenic angles. No pneumothorax. Mediastinal contours appear intact. Calcification of the aorta. IMPRESSION: 1. Cardiac enlargement with pulmonary vascular congestion and diffuse interstitial and alveolar infiltration suggesting edema. This could also represent multifocal pneumonia. 2. Focal area of more dense infiltration in the left lower lung posteriorly suggest superimposed pneumonia. Electronically Signed   By: Lucienne Capers M.D.   On: 08/06/2017 21:06   Ct Head Wo Contrast  Result Date: 07/28/2017 CLINICAL DATA:  Found unresponsive EXAM: CT HEAD WITHOUT CONTRAST TECHNIQUE: Contiguous axial images were obtained from the base of the skull through the vertex without intravenous contrast. COMPARISON:  09/24/2015 FINDINGS: Brain: There is atrophy and chronic small vessel disease changes. No acute intracranial abnormality. Specifically, no hemorrhage, hydrocephalus, mass lesion, acute infarction, or significant intracranial injury. Vascular: No hyperdense vessel or unexpected calcification. Skull: No acute calvarial abnormality. Sinuses/Orbits: Visualized paranasal sinuses and mastoids clear. Orbital soft tissues unremarkable. Other: None IMPRESSION: No acute intracranial abnormality. Atrophy, chronic microvascular disease. Electronically Signed   By: Rolm Baptise M.D.   On: 07/28/2017 09:08   Dg Chest Port 1 View  Result Date: 07/28/2017 CLINICAL DATA:  Altered mental status.  Shortness of breath. EXAM: PORTABLE CHEST 1 VIEW COMPARISON:  Radiograph of July 16, 2017. FINDINGS: Stable cardiomegaly with mild central pulmonary vascular congestion. No pneumothorax or pleural effusion is noted. Mild  bibasilar subsegmental atelectasis or infiltrates is noted. Bony thorax is unremarkable. IMPRESSION: Stable bibasilar subsegmental atelectasis or infiltrates. Stable cardiomegaly with central pulmonary vascular congestion. Electronically Signed   By: Marijo Conception, M.D.   On: 07/28/2017 08:02   Dg Chest Port 1 View  Result Date: 07/16/2017 CLINICAL DATA:  Shortness of breath. Recent pneumonia. History of COPD. EXAM: PORTABLE CHEST 1 VIEW COMPARISON:  Chest radiograph July 04, 2017 FINDINGS: Similar interstitial and alveolar airspace opacities most conspicuous in RIGHT lower lobe. No pleural effusion. Stable cardiomegaly. No pneumothorax. Soft tissue planes and included osseous structures are nonsuspicious. IMPRESSION: Similar interstitial and to lesser extent alveolar airspace opacities seen with pulmonary edema and/or pneumonia. Stable cardiomegaly. Electronically Signed   By: Thana Farr.D.  On: 07/16/2017 13:47   US Abdomen Limited Ruq  Result Date: 07/28/2017 CLINICAL DATA:  Right upper quadrant pain EXAM: ULTRASOUND ABDOMEN LIMITED RIGHT UPPER QUADRANT COMPARISON:  CT abdomen and pelvis April 23, 2017 FINDINGS: Gallbladder: No gallstones or wall thickening visualized. There is no pericholecystic fluid. No sonographic Murphy sign noted by sonographer. Common bile duct: Diameter: 3 mm. No intrahepatic or extrahepatic biliary duct dilatation. Liver: No focal lesion identified. Within normal limits in parenchymal echogenicity. Portal vein is patent on color Doppler imaging with normal direction of blood flow towards the liver. IMPRESSION: Study within normal limits. Electronically Signed   By: Lowella Grip III M.D.   On: 07/28/2017 11:13    Orson Eva, DO  [facility] Pager [phone]  If 7PM-7AM, please contact night-coverage [email or web address] Password [email or web address] 08/07/2017, 8:27 AM   LOS: 0 days

## 2017-08-07 NOTE — Progress Notes (Signed)
Late entry:  Foley catheter present from SNF on admission.  Seal not intact. Unable to obtain specimen.  Foley catheter changed per order.  Sterile technique maintained.    Yellow urine returned immediately upon placement.  Specimet obtained.  Patient tolerated well.

## 2017-08-07 NOTE — ED Notes (Signed)
Blood cultures obtained at this time. This RN will hand antibiotics.

## 2017-08-07 NOTE — NC FL2 (Signed)
[place] MEDICAID FL2 LEVEL OF CARE SCREENING TOOL     IDENTIFICATION  Patient Name: [name] [name] Birthdate: 06/03/1940 Sex: male Admission Date (Current Location): 08/06/2017  [facility] and [place] Number:  [employer] and Address:  [place] [address] [place]      Provider Number: [id number]  Attending Physician Name and Address:  Orson Eva, MD  Relative Name and Phone Number:       Current Level of Care: Other (Comment)(patient is in observation) Recommended Level of Care: [place] Prior Approval Number:    Date Approved/Denied:   PASRR Number: [id number]  Discharge Plan: SNF    Current Diagnoses: Patient Active Problem List   Diagnosis Date Noted  . Pneumonia 08/07/2017  . Lobar pneumonia ([place]) 08/07/2017  . Hypoxemia   . Transaminitis   . Encounter for hospice care discussion   . Encephalopathy acute 07/28/2017  . Palliative care by specialist   . Goals of care, counseling/discussion   . DNR (do not resuscitate) discussion   . Pulmonary edema, acute ([place]) 07/16/2017  . Acute blood loss anemia 07/16/2017  . Sepsis ([place]) 07/04/2017  . Hypokalemia 07/04/2017  . Leukocytosis 07/04/2017  . Urothelial carcinoma ([place]) 06/25/2017  . AKI (acute kidney injury) ([place])   . Severe anemia 04/22/2017  . Shortness of breath   . Acute on chronic respiratory failure ([place]) 10/24/2015  . Pressure ulcer 09/25/2015  . Respiratory failure ([place]) 09/24/2015  . Acute on chronic respiratory failure with hypercapnia ([place])   . Frequent falls 09/21/2015  . HLD (hyperlipidemia) 08/18/2015  . Chronic anemia 08/18/2015  . COPD exacerbation ([place]) 08/18/2015  . Chronic pain syndrome 05/01/2015  . Chronic respiratory failure with hypoxia ([place])   . Acute on chronic respiratory failure with hypoxia ([place]) 02/25/2014  . COPD (chronic obstructive pulmonary disease) ([place]) 02/09/2014  . Hyperglycemia, drug-induced 03/26/2013  .  Chronic back pain   . Anxiety 03/13/2012  . Hyperlipidemia 03/13/2012  . BPH (benign prostatic hyperplasia) 10/01/2011  . Tobacco abuse 09/29/2011  . Bladder tumor 09/28/2011  . PULMONARY NODULE 12/27/2009  . Essential hypertension 12/26/2009  . OSTEOARTHRITIS 12/26/2009    Orientation RESPIRATION BLADDER Height & Weight     Self, Situation, Place  O2(see discharge summary) Continent Weight: 152 lb 11.2 oz (69.3 kg) Height:  5\' 9"  (175.3 cm)  BEHAVIORAL SYMPTOMS/MOOD NEUROLOGICAL BOWEL NUTRITION STATUS      Continent Diet(regular)  AMBULATORY STATUS COMMUNICATION OF NEEDS Skin   Extensive Assist Verbally Normal                       Personal Care Assistance Level of Assistance  Bathing, Feeding, Dressing Bathing Assistance: Limited assistance Feeding assistance: Independent Dressing Assistance: Limited assistance     Functional Limitations Info  Sight, Hearing, Speech Sight Info: Adequate Hearing Info: Adequate Speech Info: Adequate    SPECIAL CARE FACTORS FREQUENCY                       Contractures Contractures Info: Not present    Additional Factors Info  Code Status, Allergies, Psychotropic Code Status Info: DNR Allergies Info: Metrizamide, Codeine, Lohexol, IVP dye, other Psychotropic Info: Celexa, Xanax, Cymbalta         Current Medications (08/07/2017):  This is the current hospital active medication list Current Facility-Administered Medications  Medication Dose Route Frequency Provider Last Rate Last Dose  . acetaminophen (TYLENOL) tablet 650 mg  650  mg Oral Q6H PRN Heath Lark D, DO   650 mg at 08/07/17 4540   Or  . acetaminophen (TYLENOL) suppository 650 mg  650 mg Rectal Q6H PRN [name], Pratik D, DO      . acetaminophen (TYLENOL) tablet 650 mg  650 mg Oral Q6H PRN [name], Pratik D, DO      . ALPRAZolam Duanne Moron) tablet 1 mg  1 mg Oral TID PRN [name], Pratik D, DO      . arformoterol (BROVANA) nebulizer solution 15 mcg  15 mcg Nebulization BID  Orson Eva, MD   15 mcg at 08/07/17 1048  . budesonide (PULMICORT) nebulizer solution 0.5 mg  0.5 mg Nebulization BID Tat, David, MD   0.5 mg at 08/07/17 1049  . ceFEPIme (MAXIPIME) 2 g in sodium chloride 0.9 % 100 mL IVPB  2 g Intravenous Q8H Tat, David, MD      . citalopram (CELEXA) tablet 20 mg  20 mg Oral Daily [name], Pratik D, DO   20 mg at 08/07/17 0849  . DULoxetine (CYMBALTA) DR capsule 60 mg  60 mg Oral Daily [name], Pratik D, DO   60 mg at 08/07/17 0849  . enoxaparin (LOVENOX) injection 40 mg  40 mg Subcutaneous Q24H [name], Pratik D, DO   40 mg at 08/07/17 0309  . ipratropium-albuterol (DUONEB) 0.5-2.5 (3) MG/3ML nebulizer solution 3 mL  3 mL Nebulization Q6H PRN [name], Pratik D, DO      . ipratropium-albuterol (DUONEB) 0.5-2.5 (3) MG/3ML nebulizer solution 3 mL  3 mL Nebulization Q6H Tat, David, MD   3 mL at 08/07/17 1350  . lactated ringers infusion   Intravenous Continuous Heath Lark D, DO 75 mL/hr at 08/07/17 0301    . montelukast (SINGULAIR) tablet 10 mg  10 mg Oral QHS Shah, Pratik D, DO   10 mg at 08/07/17 0308  . ondansetron (ZOFRAN) tablet 4 mg  4 mg Oral Q6H PRN [name], Pratik D, DO       Or  . ondansetron (ZOFRAN) injection 4 mg  4 mg Intravenous Q6H PRN [name], Pratik D, DO      . oxybutynin (DITROPAN) tablet 5 mg  5 mg Oral TID [name], Pratik D, DO   5 mg at 08/07/17 0848  . oxyCODONE (Oxy IR/ROXICODONE) immediate release tablet 5 mg  5 mg Oral Q6H PRN [name], Pratik D, DO   5 mg at 08/07/17 0309  . pantoprazole (PROTONIX) EC tablet 40 mg  40 mg Oral Daily [name], Pratik D, DO   40 mg at 08/07/17 0849  . prochlorperazine (COMPAZINE) tablet 10 mg  10 mg Oral Once per day on Mon Tue Wed Thu Fri [name], Pratik D, DO      . roflumilast Newton Pigg) tablet 500 mcg  500 mcg Oral Daily Heath Lark D, DO   500 mcg at 08/07/17 0848  . senna-docusate (Senokot-S) tablet 1 tablet  1 tablet Oral BID Heath Lark D, DO   1 tablet at 08/07/17 0849  . vancomycin (VANCOCIN) IVPB 750 mg/150 ml premix  750 mg  Intravenous Q12H Orson Eva, MD         Discharge Medications: Please see discharge summary for a list of discharge medications.  Relevant Imaging Results:  Relevant Lab Results:   Additional Information SSN: [id number]  Taequan Stockhausen, Clydene Pugh, LCSW

## 2017-08-07 NOTE — Clinical Social Work Note (Signed)
Patient discharged from [facility] on 07/30/17 and returned to [facility]. Patient was admitted to [place] at the facility. Patient's daughter revoked his hospice benefit on 08/06/17 and had patient hospitalized.  See full assessment below completed on 07/29/17.     Clinical Social Work Assessment  Patient Details  Name: [name] [name] MRN: [id number] Date of Birth: 1940-08-10  Date of referral:  07/29/17               Reason for consult:  Facility Placement, Discharge Planning                    Permission sought to share information with:    Permission granted to share information::                Name::                   Agency::                Relationship::                Contact Information:     Housing/Transportation Living arrangements for the past 2 months:  [place], [place] of Information:  Facility Patient Interpreter Needed:  None Criminal Activity/Legal Involvement Pertinent to Current Situation/Hospitalization:  No - Comment as needed Significant Relationships:  Adult Children Lives with:  Facility Resident Do you feel safe going back to the place where you live?  Yes Need for family participation in patient care:  Yes (Comment)  Care giving concerns: Pt is a long term care resident at [facility].   Social Worker assessment / plan: Pt is a 77 year old male referred to CSW as he is admitted from a SNF. Pt has been a resident at [facility] for approximately one month. He uses a wheelchair but can stand and pivot on his best days. Pt needs assistance with all ADLs. Palliative Care is consulting here. Family asking if pt can transfer to [facility]. [facility] does not have long term care beds available at this time. Per MD, pt may be stable for dc tomorrow. Will follow and assist with dc planning.  Employment status:  Retired Forensic scientist:  Medicaid In [place] PT Recommendations:  Not assessed at this  time Information / Referral to community resources:     Patient/Family's Response to care: Pt and family appear to be accepting of care.   Patient/Family's Understanding of and Emotional Response to Diagnosis, Current Treatment, and Prognosis: It is unclear if pt/family fully understand diagnosis/prognosis. Palliative Care APNP is working with pt and family to educate on pt's condition and likely outcomes. Family would like pt to transfer to [facility] but this is not going to be an option.   Emotional Assessment Appearance:  Appears stated age Attitude/Demeanor/Rapport:    Affect (typically observed):  Unable to Assess Orientation:  Oriented to Self, Oriented to Place Alcohol / Substance use:  Not Applicable Psych involvement (Current and /or in the community):  No (Comment)  Discharge Needs  Concerns to be addressed:  Discharge Planning Concerns Readmission within the last 30 days:  Yes Current discharge risk:  None Barriers to Discharge:  No Barriers Identified   Shade Flood, LCSW 07/29/2017, 10:37 AM

## 2017-08-07 NOTE — H&P (Addendum)
History and Physical    [name] [name] QZR:[id number] DOB: 12/31/1940 DOA: 08/06/2017  PCP: [name], MD   Patient coming from: SNF  Chief Complaint: Fever  HPI: [name] [name] is a 77 y.o. male with medical history significant for hypertension, COPD chronically on 4 L nasal cannula, dyslipidemia, hypertension, and bladder cancer who was recently discharged on 4/17 for hospice care at skilled nursing facility returns to the emergency department today with noted fever at the facility with chest x-ray demonstrating pneumonia.  Staff was going to initiate antibiotics, but patient's daughter persisted that the patient come to the hospital for further evaluation.  He has had some mild confusion as well but this has been intermittent.  She cannot give any significant history on account of some of his confusion.   ED Course: Vital signs are noted to be stable, but he is hypoxemic despite 5 L nasal cannula currently.  Laboratory data demonstrates leukocytosis of 21,700, and potassium of 2.9.  Troponin is 0.03.  Two-view chest x-ray demonstrates some cardiomegaly with possible edema versus multifocal pneumonia especially in the left lower lobe.  Review of Systems: Cannot be obtained due to patient condition.  Past Medical History:  Diagnosis Date  . Anxiety 03/13/2012  . Asthma   . 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  Marland Kitchen BPH (benign prostatic hyperplasia) 10/01/2011   Per cystoscopy  . Cancer ([place])    bladder  . Chronic anxiety   . Chronic back pain   . COPD (chronic obstructive pulmonary disease) (HCC)    a. on 4-5L [place] at baseline  . Depression   . Emphysema lung ([place])   . GERD (gastroesophageal reflux disease)   . Headache(784.0)   . History of stomach ulcers   . Hypercholesteremia   . Hyperlipidemia 03/13/2012  . Pulmonary nodule 09/2011   Stable appearance  . Tobacco abuse 09/29/2011    Past Surgical History:  Procedure Laterality Date  .  CHOLECYSTECTOMY    . CYSTOSCOPY  09/30/2011   Procedure: CYSTOSCOPY FLEXIBLE;  Surgeon: Marissa Nestle, MD;  Location: [facility];  Service: Urology;  Laterality: N/A;  . FRACTURE SURGERY     R ring finger, pin placed  . TRANSURETHRAL RESECTION OF BLADDER TUMOR  10/01/2011   Procedure: TRANSURETHRAL RESECTION OF BLADDER TUMOR (TURBT);  Surgeon: Marissa Nestle, MD;  Location: [facility];  Service: Urology;  Laterality: N/A;     reports that he has been smoking cigarettes.  He has a 29.00 pack-year smoking history. He has never used smokeless tobacco. He reports that he does not drink alcohol or use drugs.  Allergies  Allergen Reactions  . Metrizamide Hives  . Codeine   . Iohexol Hives     Code: HIVES, Desc: PT STATES HE BROKE OUT IN HIVES AND RASH AFTER CT NECK EARLY SEPT 2011; NO RESP PROBLEMS; NEEDS PRE-MEDS; MKS, Onset Date: [id number]   . Ivp Dye [Iodinated Diagnostic Agents] Hives  . Other     Broccoli, Cauliflower     Family History  Problem Relation Age of Onset  . Kidney failure Mother   . Emphysema Brother        was not close to family; does not truly know their medical problems.  . Emphysema Brother   . Emphysema Brother   . Cancer Neg Hx     Prior to Admission medications   Medication Sig Start Date End Date Taking? Authorizing Provider  acetaminophen (TYLENOL) 325 MG tablet Take 650 mg  by mouth every 6 (six) hours as needed for headache.   Yes [provider]  albuterol (PROVENTIL) (2.5 MG/3ML) 0.083% nebulizer solution Take 3 mLs (2.5 mg total) by nebulization every 4 (four) hours as needed. For shortness of breath Patient taking differently: Take 2.5 mg by nebulization every 6 (six) hours. For shortness of breath 11/28/15  Yes Timmothy Euler, MD  ALPRAZolam Duanne Moron) 1 MG tablet Take 1 tablet (1 mg total) by mouth 3 (three) times daily as needed for anxiety. 07/30/17  Yes Barton Dubois, MD  citalopram (CELEXA) 20 MG tablet Take 20 mg by mouth daily.   Yes  [provider]  DULoxetine (CYMBALTA) 60 MG capsule Take 1 capsule (60 mg total) by mouth daily. 04/24/17  Yes Shahmehdi, Seyed A, MD  furosemide (LASIX) 20 MG tablet Take 20 mg by mouth daily.   Yes [provider]  montelukast (SINGULAIR) 10 MG tablet Take 10 mg by mouth at bedtime.   Yes [provider]  omeprazole (PRILOSEC) 20 MG capsule Take 20 mg by mouth daily. 11/11/16  Yes [provider]  oxybutynin (DITROPAN) 5 MG tablet Take 5 mg by mouth 3 (three) times daily.   Yes [provider]  oxyCODONE (OXY IR/ROXICODONE) 5 MG immediate release tablet Take 1 tablet (5 mg total) by mouth every 6 (six) hours as needed for severe pain. Patient taking differently: Take 5 mg by mouth 2 (two) times daily.  07/30/17  Yes Barton Dubois, MD  OXYGEN Inhale 6 L into the lungs continuous.   Yes [provider]  prochlorperazine (COMPAZINE) 10 MG tablet Take 10 mg by mouth as directed. Patient takes one tablet by mouth in the morning every Monday, Tuesday, Wednesday, Thursday, and Friday for nausea one hour prior to radiation treatments   Yes [provider]  roflumilast (DALIRESP) 500 MCG TABS tablet Take 1 Tablet by mouth once daily 11/11/16  Yes [provider]  senna-docusate (SENOKOT-S) 8.6-50 MG tablet Take 1 tablet by mouth 2 (two) times daily.   Yes [provider]    Physical Exam: Vitals:   08/06/17 2244 08/07/17 0030 08/07/17 0113 08/07/17 0130  BP: 127/83 140/87  127/77  Pulse: 95 88  96  Resp: 20 (!) 22  (!) 23  Temp:      TempSrc:      SpO2: 90% (!) 89% 90% 90%    Constitutional: NAD, appears confused Vitals:   08/06/17 2244 08/07/17 0030 08/07/17 0113 08/07/17 0130  BP: 127/83 140/87  127/77  Pulse: 95 88  96  Resp: 20 (!) 22  (!) 23  Temp:      TempSrc:      SpO2: 90% (!) 89% 90% 90%   Eyes: lids and conjunctivae normal ENMT: Mucous membranes are moist.  Neck: normal, supple Respiratory: clear  to auscultation bilaterally. Normal respiratory effort. No accessory muscle use.  Currently on 5 L nasal cannula. Cardiovascular: Regular rate and rhythm, no murmurs. No extremity edema. Abdomen: no tenderness, no distention. Bowel sounds positive.  Musculoskeletal:  No joint deformity upper and lower extremities.   Skin: no rashes, lesions, ulcers.  Foley with dark, yellow, urine output.  Labs on Admission: I have personally reviewed following labs and imaging studies  CBC: Recent Labs  Lab 08/06/17 2138  WBC 21.7*  NEUTROABS 15.9  HGB 10.2*  HCT 35.2*  MCV 82.1  PLT 160   Basic Metabolic Panel: Recent Labs  Lab 08/06/17 2138  NA 139  K 2.9*  CL 92*  CO2 32  GLUCOSE 140*  BUN 29*  CREATININE 1.15  CALCIUM 9.0   GFR: Estimated Creatinine Clearance: 55.5 mL/min (by C-G formula based on SCr of 1.15 mg/dL). Liver Function Tests: No results for input(s): AST, ALT, ALKPHOS, BILITOT, PROT, ALBUMIN in the last 168 hours. No results for input(s): LIPASE, AMYLASE in the last 168 hours. No results for input(s): AMMONIA in the last 168 hours. Coagulation Profile: No results for input(s): INR, PROTIME in the last 168 hours. Cardiac Enzymes: Recent Labs  Lab 08/06/17 2138  TROPONINI 0.03*   BNP (last 3 results) No results for input(s): PROBNP in the last 8760 hours. HbA1C: No results for input(s): HGBA1C in the last 72 hours. CBG: No results for input(s): GLUCAP in the last 168 hours. Lipid Profile: No results for input(s): CHOL, HDL, LDLCALC, TRIG, CHOLHDL, LDLDIRECT in the last 72 hours. Thyroid Function Tests: No results for input(s): TSH, T4TOTAL, FREET4, T3FREE, THYROIDAB in the last 72 hours. Anemia Panel: No results for input(s): VITAMINB12, FOLATE, FERRITIN, TIBC, IRON, RETICCTPCT in the last 72 hours. Urine analysis:    Component Value Date/Time   COLORURINE YELLOW 07/28/2017 0741   APPEARANCEUR CLOUDY (A) 07/28/2017 0741   APPEARANCEUR Turbid (A) 01/25/2016  1407   LABSPEC 1.009 07/28/2017 0741   PHURINE 6.0 07/28/2017 0741   GLUCOSEU NEGATIVE 07/28/2017 0741   HGBUR LARGE (A) 07/28/2017 0741   BILIRUBINUR NEGATIVE 07/28/2017 0741   BILIRUBINUR Negative 01/25/2016 1407   KETONESUR NEGATIVE 07/28/2017 0741   PROTEINUR 100 (A) 07/28/2017 0741   UROBILINOGEN 0.2 05/04/2014 0956   NITRITE NEGATIVE 07/28/2017 0741   LEUKOCYTESUR MODERATE (A) 07/28/2017 0741   LEUKOCYTESUR 3+ (A) 01/25/2016 1407    Radiological Exams on Admission: Dg Chest 2 View  Result Date: 08/06/2017 CLINICAL DATA:  Patient was diagnosed today with pneumonia at an outside facility. Here for re-evaluation. History of COPD and asthma. EXAM: CHEST - 2 VIEW COMPARISON:  07/28/2017 FINDINGS: Cardiac enlargement with mild pulmonary vascular congestion. Diffuse interstitial and alveolar infiltrates may indicate pneumonia or edema. There is more dense infiltration in the left lower lung posteriorly suggesting superimposed pneumonia. No blunting of costophrenic angles. No pneumothorax. Mediastinal contours appear intact. Calcification of the aorta. IMPRESSION: 1. Cardiac enlargement with pulmonary vascular congestion and diffuse interstitial and alveolar infiltration suggesting edema. This could also represent multifocal pneumonia. 2. Focal area of more dense infiltration in the left lower lung posteriorly suggest superimposed pneumonia. Electronically Signed   By: Lucienne Capers M.D.   On: 08/06/2017 21:06    Assessment/Plan Principal Problem:   Pneumonia Active Problems:   Essential hypertension   Bladder tumor   Hyperlipidemia   COPD (chronic obstructive pulmonary disease) (HCC)   Acute on chronic respiratory failure with hypoxia (HCC)   Hypokalemia    1. Fever secondary to pneumonia.  Continue HCAP coverage with vancomycin and cefepime.  Lactic acid noted to be 0.79.  Continue gentle IV fluid and follow blood cultures.  Patient is not a candidate for aggressive care otherwise  and I will also consult palliative care once again.  Continue pain/anxiety medications. Hopefully patient can be discharged back to skilled nursing facility with oral antibiotics soon.  Hold Lasix. 2. Acute metabolic encephalopathy likely secondary to above.  Monitor with neuro checks. 3. Acute on chronic hypoxemic respiratory failure secondary to above.  Increased nasal cannula to titrate O2 saturation greater than 88%. HFNC per respiratory. 4. Hypokalemia.  Potassium supplementation orally and IV with recheck in a.m. along  with magnesium level. 5. Bladder carcinoma.  Maintain Foley catheter.  Patient is supposed to be under hospice care with no plans for further treatment noted. 6. COPD.  Order duo nebs as needed for shortness of breath or wheezing.   DVT prophylaxis: Lovenox  Code Status: DNR; possible comfort measures Family Communication: None at bedside Disposition Plan: Admit for sepsis treatment; likely candidate for early discharge with hospice care Consults called: Palliative care Admission status: Observation, MedSurg   Hydia Copelin Darleen Crocker DO [facility] Pager [phone]  If 7PM-7AM, please contact night-coverage [email or web address] Password [facility]  08/07/2017, 1:43 AM

## 2017-08-07 NOTE — Progress Notes (Signed)
Pt placed on HFNC due to low O2 saturations

## 2017-08-07 NOTE — Progress Notes (Signed)
Daily Progress Note   Patient Name: [name] [name]       Date: 08/07/2017 DOB: 1940/09/23  Age: 77 y.o. MRN#: [id number] Attending Physician: Orson Eva, MD Primary Care Physician: [name], MD Admit Date: 08/06/2017  Reason for Consultation/Follow-up: Establishing goals of care, Hospice Evaluation and Psychosocial/spiritual support  Subjective: [name] [name] is resting quietly in bed.  He greets me making them briefly keeping eye contact.  He is alert, oriented to person and place only.  He is not sure why he is in the hospital telling me that his daughter said that he should come because he "had spasms and everything else".  No family at bedside at this time.  Call to daughter, Birdie Beveridge.  We talked about [name] [name] hospitalization.  Debbie shared with me that she wanted to know if the doctor here at the hospital could give him something different for his pneumonia.  I share that [facility] was going to give him an antibiotic, did she know which one, what concerned her about that antibiotic.  She did not know what they were going to give him there at [facility].  [name] goes further the state that she was told that her father would likely die of pneumonia before cancer.  I shared that this is likely so.  We talked about [name] [name] weight loss of approximately 30 pounds in the last 6 weeks.  Debbie talks about Ensure, and I shared that this would be provided by [facility], but weight loss such as this is normal and expected with advancing cancer.  I share that [name] [name] will likely return to [facility] residential SNF in the next day or so.  [name] states that hospice will continue to give services when he returns.   Call to son Darnelle [name], left voicemail message.  Conference with social  worker related to plan of care and disposition. Telephone conference with [name], RN at [facility] related to plan of care and disposition.   Length of Stay: 0  Current Medications: Scheduled Meds:  . arformoterol  15 mcg Nebulization BID  . budesonide (PULMICORT) nebulizer solution  0.5 mg Nebulization BID  . citalopram  20 mg Oral Daily  . DULoxetine  60 mg Oral Daily  . enoxaparin (LOVENOX) injection  40 mg Subcutaneous Q24H  . ipratropium-albuterol  3 mL Nebulization Q6H  . montelukast  10 mg Oral QHS  . oxybutynin  5 mg Oral TID  . pantoprazole  40 mg Oral Daily  . prochlorperazine  10 mg Oral Once per day on Mon Tue Wed Thu Fri  . roflumilast  500 mcg Oral Daily  . senna-docusate  1 tablet Oral BID    Continuous Infusions: . ceFEPime (MAXIPIME) IV    . ceFEPime (MAXIPIME) IV    . lactated ringers 75 mL/hr at 08/07/17 0301  . vancomycin      PRN Meds: acetaminophen **OR** acetaminophen, acetaminophen, ALPRAZolam, ipratropium-albuterol, ondansetron **OR** ondansetron (ZOFRAN) IV, oxyCODONE  Physical Exam  Constitutional: No distress.  HENT:  Head: Atraumatic.  Cardiovascular: Normal rate.  Pulmonary/Chest: Effort normal. No respiratory distress.  Abdominal: Soft. He exhibits no distension.  Musculoskeletal: He exhibits no edema.  Neurological: He is alert.  Oriented to self only.  Skin: Skin is warm and dry.  Nursing note and vitals reviewed.           Vital Signs: BP 119/79 (BP Location: Left Arm)   Pulse 98   Temp 98.2 F (36.8 C) (Oral)   Resp (!) 23   Ht 5\' 9"  (1.753 m)   Wt 69.3 kg (152 lb 11.2 oz)   SpO2 93%   BMI 22.55 kg/m  SpO2: SpO2: 93 % O2 Device: O2 Device: High Flow Nasal Cannula O2 Flow Rate: O2 Flow Rate (L/min): 6 L/min  Intake/output summary:   Intake/Output Summary (Last 24 hours) at 08/07/2017 1253 Last data filed at 08/07/2017 0841 Gross per 24 hour  Intake 588.75 ml  Output -  Net 588.75 ml   LBM:   Baseline Weight: Weight:  69.3 kg (152 lb 11.2 oz) Most recent weight: Weight: 69.3 kg (152 lb 11.2 oz)       Palliative Assessment/Data:      Patient Active Problem List   Diagnosis Date Noted  . Pneumonia 08/07/2017  . Lobar pneumonia ([place]) 08/07/2017  . Hypoxemia   . Transaminitis   . Encounter for hospice care discussion   . Encephalopathy acute 07/28/2017  . Palliative care by specialist   . Goals of care, counseling/discussion   . DNR (do not resuscitate) discussion   . Pulmonary edema, acute ([place]) 07/16/2017  . Acute blood loss anemia 07/16/2017  . Sepsis ([place]) 07/04/2017  . Hypokalemia 07/04/2017  . Leukocytosis 07/04/2017  . Urothelial carcinoma ([place]) 06/25/2017  . AKI (acute kidney injury) ([place])   . Severe anemia 04/22/2017  . Shortness of breath   . Acute on chronic respiratory failure ([place]) 10/24/2015  . Pressure ulcer 09/25/2015  . Respiratory failure ([place]) 09/24/2015  . Acute on chronic respiratory failure with hypercapnia ([place])   . Frequent falls 09/21/2015  . HLD (hyperlipidemia) 08/18/2015  . Chronic anemia 08/18/2015  . COPD exacerbation ([place]) 08/18/2015  . Chronic pain syndrome 05/01/2015  . Chronic respiratory failure with hypoxia ([place])   . Acute on chronic respiratory failure with hypoxia ([place]) 02/25/2014  . COPD (chronic obstructive pulmonary disease) ([place]) 02/09/2014  . Hyperglycemia, drug-induced 03/26/2013  . Chronic back pain   . Anxiety 03/13/2012  . Hyperlipidemia 03/13/2012  . BPH (benign prostatic hyperplasia) 10/01/2011  . Tobacco abuse 09/29/2011  . Bladder tumor 09/28/2011  . PULMONARY NODULE 12/27/2009  . Essential hypertension 12/26/2009  . OSTEOARTHRITIS 12/26/2009    Palliative Care Assessment & Plan   Patient Profile: 77 y.o.malewith past medical history of high-grade urothelial carcinoma since 2013, now with hematuria and4hospital  admissions in 6 weeks, COPD continuous home O2 2 L, pulmonary nodule with stable appearance since 2013, anxiety  and depression, chronic back pain, high blood pressure and cholesterol, [facility] local SNF for rehab,admitted on4/24/2019withAcute on chronic respiratory failure with hypoxia.  Assessment: Acute on chronic respiratory failure with hypoxia: Per RN at [facility], Mr. Hayashida was going to be treated with by mouth antibiotics but daughter, Child Campoy declined to have in-house treatment, revoked hospice, and requested Mr. Debono be transferred to the hospital.  He will likely return to [facility] within the next day or so, per daughter to restart hospice.  Recommendations/Plan:  Although patient and family are requesting a change of residential SNF from [facility] to [facility], [facility] is not accepting Mr. Eckardt as a new resident.  Return to [facility] residential SNF with the benefits of hospice of [place].  Continue to treat the treatable but no further chemotherapy, radiation, or hospitalizations, per daughter/HC POA, Raechel Chute.  Cayce Quezada will likely need continued coaching and education related to the benefits of hospice.  Goals of Care and Additional Recommendations:  Limitations on Scope of Treatment: Treat the treatable but no CPR, no intubation.  Return to residential SNF, [facility] with hospice services.  Code Status:    Code Status Orders  (From admission, onward)        Start     Ordered   08/07/17 0149  Do not attempt resuscitation (DNR)  Continuous    Question Answer Comment  In the event of cardiac or respiratory ARREST Do not call a "code blue"   In the event of cardiac or respiratory ARREST Do not perform Intubation, CPR, defibrillation or ACLS   In the event of cardiac or respiratory ARREST Use medication by any route, position, wound care, and other measures to relive pain and suffering. May use oxygen, suction and manual treatment of airway obstruction as needed for comfort.      08/07/17 0151      Code Status History    Date Active Date Inactive Code Status Order ID Comments User Context   07/28/2017 1535 07/30/2017 2224 DNR [id number]  Drue Novel, NP Inpatient   07/28/2017 1243 07/28/2017 1535 Full Code [id number]  Rodena Goldmann, DO ED   07/16/2017 1837 07/17/2017 2332 Full Code [id number]  Isaac Bliss, Rayford Halsted, MD Inpatient   07/04/2017 1141 07/08/2017 1713 DNR [id number]  Erline Hau, MD Inpatient   07/04/2017 0714 07/04/2017 1141 DNR [id number]  Orlie Dakin, MD ED   06/25/2017 1744 06/30/2017 1848 DNR [id number]  Orson Eva, MD Inpatient   06/21/2017 2350 06/25/2017 1744 Full Code [id number]  Oswald Hillock, MD Inpatient   04/22/2017 1815 04/24/2017 0353 Full Code [id number]  Bethena Roys, MD Inpatient   10/24/2015 2123 10/27/2015 1613 Full Code [id number]  Bonnell Public, MD Inpatient   09/24/2015 2304 09/28/2015 2119 Full Code [id number]  Jani Gravel, MD Inpatient   08/18/2015 2157 08/22/2015 1658 Full Code [id number]  Rise Patience, MD Inpatient   05/04/2014 1331 05/08/2014 1932 Full Code [id number]  Radene Gunning, NP Inpatient   02/24/2014 1624 02/28/2014 1502 Full Code [id number]  Doree Albee, MD ED   02/09/2014 2213 02/13/2014 1548 Full Code [id number]  Waldemar Dickens, MD Inpatient   11/07/2013 0043 11/08/2013 1452 Full Code [id number]  Phillips Grout, MD Inpatient   03/25/2013 2227 03/30/2013 1715 Full Code [id number]  Derrill Kay  A, MD Inpatient   02/16/2013 1506 02/20/2013 1458 Full Code [id number]  Donne Hazel, MD ED   03/12/2012 0153 03/17/2012 1541 Partial Code [id number]  Ward, Kerman Passey, RN Inpatient   09/28/2011 2306 10/02/2011 1732 Full Code [id number]  Ethelene Hal, RN Inpatient   04/22/2011 0230 04/25/2011 1343 Full Code [id number]  Bethena Roys, RN Inpatient       Prognosis:   < 3 months or less would not be surprising based on6hospitalizations in the last 6 months, 5of which have been in the last 6 weeks.  Weight loss from 182 pounds 252  pounds over the last 6 weeks.  history of acute respiratory failure, multifactorial encephalopathy, acute blood loss from hematuria related to bladder cancer, along with other comorbid conditions including end-stage COPD.  Discharge Planning:  Return to [facility] residential SNF with the benefits of hospice.  Care plan was discussed with nursing staff, social worker, and Dr. Carles Collet.  Thank you for allowing the Palliative Medicine Team to assist in the care of this patient.   Time In:  1330 Time Out:  1405 Total Time  35 minutes Prolonged Time Billed  no       Greater than 50%  of this time was spent counseling and coordinating care related to the above assessment and plan.  Drue Novel, NP  Please contact Palliative Medicine Team phone at [phone] for questions and concerns.

## 2017-08-07 NOTE — Progress Notes (Signed)
ANTIBIOTIC CONSULT NOTE-Preliminary  Pharmacy Consult for cefepime Indication: pneumonia  Allergies  Allergen Reactions  . Metrizamide Hives  . Codeine   . Iohexol Hives     Code: HIVES, Desc: PT STATES HE BROKE OUT IN HIVES AND RASH AFTER CT NECK EARLY SEPT 2011; NO RESP PROBLEMS; NEEDS PRE-MEDS; MKS, Onset Date: [id number]   . Ivp Dye [Iodinated Diagnostic Agents] Hives  . Other     Broccoli, Cauliflower     Patient Measurements:     Vital Signs: Temp: 98.2 F (36.8 C) (04/25 0454) Temp Source: Oral (04/25 0454) BP: 119/79 (04/25 0454) Pulse Rate: 98 (04/25 0454)  Labs: Recent Labs    08/06/17 2138  WBC 21.7*  HGB 10.2*  PLT 296  CREATININE 1.15    Estimated Creatinine Clearance: 55.5 mL/min (by C-G formula based on SCr of 1.15 mg/dL).  No results for input(s): VANCOTROUGH, VANCOPEAK, VANCORANDOM, GENTTROUGH, GENTPEAK, GENTRANDOM, TOBRATROUGH, TOBRAPEAK, TOBRARND, AMIKACINPEAK, AMIKACINTROU, AMIKACIN in the last 72 hours.   Microbiology: Recent Results (from the past 720 hour(s))  Urine culture     Status: None   Collection Time: 07/28/17  7:41 AM  Result Value Ref Range Status   Specimen Description   Final    URINE, CATHETERIZED Performed at [facility], [address], [place], [place] [address]    Special Requests   Final    Normal Performed at [facility], [address], [place], [place] [address]    Culture   Final    NO GROWTH Performed at [facility], [place] [address], [place], [place] [address]    Report Status 07/30/2017 FINAL  Final  Blood Culture (routine x 2)     Status: None   Collection Time: 07/28/17  8:35 AM  Result Value Ref Range Status   Specimen Description BLOOD LEFT ARM  Final   Special Requests   Final    BOTTLES DRAWN AEROBIC ONLY Blood Culture adequate volume   Culture   Final    NO GROWTH 5 DAYS Performed at [facility], [address] [place], [place] [address]    Report Status 08/02/2017 FINAL  Final  Blood  Culture (routine x 2)     Status: None   Collection Time: 07/28/17  8:35 AM  Result Value Ref Range Status   Specimen Description BLOOD LEFT WRIST  Final   Special Requests   Final    BOTTLES DRAWN AEROBIC AND ANAEROBIC Blood Culture results may not be optimal due to an inadequate volume of blood received in culture bottles   Culture   Final    NO GROWTH 5 DAYS Performed at [facility], [address], [place], [place] [address]    Report Status 08/02/2017 FINAL  Final    Medical History: Past Medical History:  Diagnosis Date  . Anxiety 03/13/2012  . Asthma   . 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  Marland Kitchen BPH (benign prostatic hyperplasia) 10/01/2011   Per cystoscopy  . Cancer ([place])    bladder  . Chronic anxiety   . Chronic back pain   . COPD (chronic obstructive pulmonary disease) (HCC)    a. on 4-5L [place] at baseline  . Depression   . Emphysema lung ([place])   . GERD (gastroesophageal reflux disease)   . Headache(784.0)   . History of stomach ulcers   . Hypercholesteremia   . Hyperlipidemia 03/13/2012  . Pulmonary nodule 09/2011   Stable appearance  . Tobacco abuse 09/29/2011  Medications:  Infusions:  . ceFEPime (MAXIPIME) IV    . lactated ringers 75 mL/hr at 08/07/17 0301  . potassium chloride Stopped (08/07/17 0412)   PRN: acetaminophen **OR** acetaminophen, acetaminophen, ALPRAZolam, ipratropium-albuterol, ondansetron **OR** ondansetron (ZOFRAN) IV, oxyCODONE Anti-infectives (From admission, onward)   Start     Dose/Rate Route Frequency Ordered Stop   08/07/17 0830  ceFEPIme (MAXIPIME) 2 g in sodium chloride 0.9 % 100 mL IVPB     2 g 200 mL/hr over 30 Minutes Intravenous  Once 08/07/17 0443     08/06/17 2330  ceFEPIme (MAXIPIME) 2 g in sodium chloride 0.9 % 100 mL IVPB     2 g 200 mL/hr over 30 Minutes Intravenous  Once 08/06/17 2328 08/07/17 0052   08/06/17 2330  vancomycin (VANCOCIN) IVPB 1000 mg/200 mL premix     1,000 mg 200 mL/hr over 60  Minutes Intravenous  Once 08/06/17 2328 08/07/17 0211      Assessment: 77 yo male from SNF with fever secondary to PNA. Starting vancomycin and cefepime.  First doses given in ED.     Plan:  Preliminary review of pertinent patient information completed.  Protocol will be initiated with dose(s) of cefepime 2 grams 8 hours after first dose.  Forestine Na clinical pharmacist will complete review during morning rounds to assess patient and finalize treatment regimen if needed.  Nyra Capes, RPH 08/07/2017,5:05 AM

## 2017-08-07 NOTE — Progress Notes (Signed)
Pt arrived to 335 from ED via stretcher. Oriented to room and equipmemt. Skin swarm completed with Steffanie , Therapist, sports. Blanchable redness noted to sacrum. No other skin issues noted. Foley in place and present on arrival to ER, per report. Amber urine noted. No needs voiced at this time. Will continue to monitor.

## 2017-08-07 NOTE — Progress Notes (Signed)
Pharmacy Antibiotic Note  [name] [name] is a 77 y.o. male admitted on 08/06/2017 with pneumonia.  Pharmacy has been consulted for Vancomycin and Cefepime dosing.  Plan: Vancomycin 1000mg  loading dose, then 750mg   IV every 12 hours.  Goal trough 15-20 mcg/mL.  Cefepime 2gm IV q8h F/U cxs and clinical progress Monitor V/S, labs and levels as indicated  Height: 5\' 9"  (175.3 cm) Weight: 152 lb 11.2 oz (69.3 kg) IBW/kg (Calculated) : 70.7  Temp (24hrs), Avg:98 F (36.7 C), Min:97.7 F (36.5 C), Max:98.2 F (36.8 C)  Recent Labs  Lab 08/06/17 2138 08/07/17 0011 08/07/17 0629  WBC 21.7*  --  16.1*  CREATININE 1.15  --  1.01  LATICACIDVEN  --  0.79  --     Estimated Creatinine Clearance: 61.9 mL/min (by C-G formula based on SCr of 1.01 mg/dL).    Allergies  Allergen Reactions  . Metrizamide Hives  . Codeine   . Iohexol Hives     Code: HIVES, Desc: PT STATES HE BROKE OUT IN HIVES AND RASH AFTER CT NECK EARLY SEPT 2011; NO RESP PROBLEMS; NEEDS PRE-MEDS; MKS, Onset Date: [id number]   . Ivp Dye [Iodinated Diagnostic Agents] Hives  . Other     Broccoli, Cauliflower     Antimicrobials this admission: Vancomycin 4/25 >>   Cefepime 4/25 >>   Dose adjustments this admission: n/a  Microbiology results: 4/25 BCx: ngtd 4/25 UCx: pending  Thank you for allowing pharmacy to be a part of this patient's care.  Isac Sarna, BS Pharm D, [place] Clinical Pharmacist Pager [phone] 08/07/2017 10:41 AM

## 2017-08-08 ENCOUNTER — Other Ambulatory Visit: Payer: Self-pay

## 2017-08-08 DIAGNOSIS — Z906 Acquired absence of other parts of urinary tract: Secondary | ICD-10-CM | POA: Diagnosis not present

## 2017-08-08 DIAGNOSIS — F329 Major depressive disorder, single episode, unspecified: Secondary | ICD-10-CM | POA: Diagnosis present

## 2017-08-08 DIAGNOSIS — J9621 Acute and chronic respiratory failure with hypoxia: Secondary | ICD-10-CM

## 2017-08-08 DIAGNOSIS — J439 Emphysema, unspecified: Secondary | ICD-10-CM | POA: Diagnosis present

## 2017-08-08 DIAGNOSIS — G9341 Metabolic encephalopathy: Secondary | ICD-10-CM | POA: Diagnosis present

## 2017-08-08 DIAGNOSIS — C679 Malignant neoplasm of bladder, unspecified: Secondary | ICD-10-CM | POA: Diagnosis present

## 2017-08-08 DIAGNOSIS — F419 Anxiety disorder, unspecified: Secondary | ICD-10-CM | POA: Diagnosis present

## 2017-08-08 DIAGNOSIS — N401 Enlarged prostate with lower urinary tract symptoms: Secondary | ICD-10-CM | POA: Diagnosis present

## 2017-08-08 DIAGNOSIS — R319 Hematuria, unspecified: Secondary | ICD-10-CM | POA: Diagnosis present

## 2017-08-08 DIAGNOSIS — E8770 Fluid overload, unspecified: Secondary | ICD-10-CM | POA: Diagnosis present

## 2017-08-08 DIAGNOSIS — F17210 Nicotine dependence, cigarettes, uncomplicated: Secondary | ICD-10-CM | POA: Diagnosis present

## 2017-08-08 DIAGNOSIS — I10 Essential (primary) hypertension: Secondary | ICD-10-CM

## 2017-08-08 DIAGNOSIS — R739 Hyperglycemia, unspecified: Secondary | ICD-10-CM | POA: Diagnosis present

## 2017-08-08 DIAGNOSIS — Y95 Nosocomial condition: Secondary | ICD-10-CM | POA: Diagnosis present

## 2017-08-08 DIAGNOSIS — J181 Lobar pneumonia, unspecified organism: Secondary | ICD-10-CM

## 2017-08-08 DIAGNOSIS — J441 Chronic obstructive pulmonary disease with (acute) exacerbation: Secondary | ICD-10-CM | POA: Diagnosis not present

## 2017-08-08 DIAGNOSIS — Z9981 Dependence on supplemental oxygen: Secondary | ICD-10-CM | POA: Diagnosis not present

## 2017-08-08 DIAGNOSIS — T380X5A Adverse effect of glucocorticoids and synthetic analogues, initial encounter: Secondary | ICD-10-CM | POA: Diagnosis present

## 2017-08-08 DIAGNOSIS — Z960 Presence of urogenital implants: Secondary | ICD-10-CM | POA: Diagnosis present

## 2017-08-08 DIAGNOSIS — Z66 Do not resuscitate: Secondary | ICD-10-CM | POA: Diagnosis present

## 2017-08-08 DIAGNOSIS — Z515 Encounter for palliative care: Secondary | ICD-10-CM | POA: Diagnosis not present

## 2017-08-08 DIAGNOSIS — E785 Hyperlipidemia, unspecified: Secondary | ICD-10-CM | POA: Diagnosis present

## 2017-08-08 DIAGNOSIS — D494 Neoplasm of unspecified behavior of bladder: Secondary | ICD-10-CM | POA: Diagnosis not present

## 2017-08-08 DIAGNOSIS — J189 Pneumonia, unspecified organism: Secondary | ICD-10-CM | POA: Diagnosis present

## 2017-08-08 DIAGNOSIS — E876 Hypokalemia: Secondary | ICD-10-CM | POA: Diagnosis not present

## 2017-08-08 DIAGNOSIS — R338 Other retention of urine: Secondary | ICD-10-CM | POA: Diagnosis present

## 2017-08-08 DIAGNOSIS — K219 Gastro-esophageal reflux disease without esophagitis: Secondary | ICD-10-CM | POA: Diagnosis present

## 2017-08-08 DIAGNOSIS — C689 Malignant neoplasm of urinary organ, unspecified: Secondary | ICD-10-CM

## 2017-08-08 DIAGNOSIS — G9340 Encephalopathy, unspecified: Secondary | ICD-10-CM | POA: Diagnosis not present

## 2017-08-08 LAB — COMPREHENSIVE METABOLIC PANEL
ALT: 32 U/L (ref 17–63)
AST: 22 U/L (ref 15–41)
Albumin: 2.2 g/dL — ABNORMAL LOW (ref 3.5–5.0)
Alkaline Phosphatase: 53 U/L (ref 38–126)
Anion gap: 12 (ref 5–15)
BUN: 15 mg/dL (ref 6–20)
CHLORIDE: 95 mmol/L — AB (ref 101–111)
CO2: 29 mmol/L (ref 22–32)
CREATININE: 0.92 mg/dL (ref 0.61–1.24)
Calcium: 8.8 mg/dL — ABNORMAL LOW (ref 8.9–10.3)
Glucose, Bld: 100 mg/dL — ABNORMAL HIGH (ref 65–99)
POTASSIUM: 3.4 mmol/L — AB (ref 3.5–5.1)
Sodium: 136 mmol/L (ref 135–145)
Total Bilirubin: 0.9 mg/dL (ref 0.3–1.2)
Total Protein: 6.6 g/dL (ref 6.5–8.1)

## 2017-08-08 LAB — CBC
HCT: 32.3 % — ABNORMAL LOW (ref 39.0–52.0)
Hemoglobin: 9.3 g/dL — ABNORMAL LOW (ref 13.0–17.0)
MCH: 23.5 pg — AB (ref 26.0–34.0)
MCHC: 28.8 g/dL — AB (ref 30.0–36.0)
MCV: 81.6 fL (ref 78.0–100.0)
PLATELETS: 302 10*3/uL (ref 150–400)
RBC: 3.96 MIL/uL — ABNORMAL LOW (ref 4.22–5.81)
RDW: 20.2 % — ABNORMAL HIGH (ref 11.5–15.5)
WBC: 12.9 10*3/uL — ABNORMAL HIGH (ref 4.0–10.5)

## 2017-08-08 LAB — MRSA PCR SCREENING: MRSA BY PCR: POSITIVE — AB

## 2017-08-08 LAB — MAGNESIUM: Magnesium: 1.6 mg/dL — ABNORMAL LOW (ref 1.7–2.4)

## 2017-08-08 LAB — PROCALCITONIN

## 2017-08-08 MED ORDER — POTASSIUM CHLORIDE CRYS ER 20 MEQ PO TBCR
40.0000 meq | EXTENDED_RELEASE_TABLET | Freq: Once | ORAL | Status: AC
  Administered 2017-08-08: 40 meq via ORAL
  Filled 2017-08-08: qty 2

## 2017-08-08 MED ORDER — FUROSEMIDE 10 MG/ML IJ SOLN
40.0000 mg | Freq: Once | INTRAMUSCULAR | Status: AC
  Administered 2017-08-08: 40 mg via INTRAVENOUS
  Filled 2017-08-08: qty 4

## 2017-08-08 MED ORDER — MAGNESIUM SULFATE 2 GM/50ML IV SOLN
2.0000 g | Freq: Once | INTRAVENOUS | Status: AC
  Administered 2017-08-08: 2 g via INTRAVENOUS
  Filled 2017-08-08: qty 50

## 2017-08-08 MED ORDER — HALOPERIDOL LACTATE 5 MG/ML IJ SOLN
5.0000 mg | Freq: Once | INTRAMUSCULAR | Status: AC
  Administered 2017-08-08: 5 mg via INTRAMUSCULAR
  Filled 2017-08-08: qty 1

## 2017-08-08 MED ORDER — HALOPERIDOL LACTATE 5 MG/ML IJ SOLN
5.0000 mg | Freq: Four times a day (QID) | INTRAMUSCULAR | Status: DC | PRN
  Administered 2017-08-08: 5 mg via INTRAVENOUS
  Filled 2017-08-08: qty 1

## 2017-08-08 NOTE — Clinical Social Work Note (Signed)
Patient's daughter, [name] [name], is seeking to become patient's HCPOA.  LCSW advised that due to patient not being oriented [facility] could not notarize HCPOA documentation. LCSW discussed with patient seeking legal counsel.    [name] [name], [name] Pugh, LCSW

## 2017-08-08 NOTE — Progress Notes (Signed)
PROGRESS NOTE  [name] [name] WJX:[id number] DOB: 1941/02/26 DOA: 08/06/2017 PCP: [name], MD  Brief History:  77 year old male with a history of bladder cancer,COPD,  chronic respiratory failure on 4 L oxygen, hyperlipidemia, depression, hyperlipidemia presenting with fever at [facility].  Apparently, chest x-ray was performed and showed pneumonia.  Staff received orders for antibiotics but the patient's daughter at bedside insisted that he be transferred to the hospital for further evaluation. The patient has had numerous hospital admissions in the last 6 months.  Most recently, the patient was discharged from the hospital after a stay from 07/28/17 through 07/30/2017 for acute encephalopathy secondary to UTI.  Upon presentation, he was noted to have WBC 21.7.  He was started vancomycin and cefepime after blood cultures were obtained.    Assessment/Plan: Acute on chronic respiratory failure with hypoxia -Secondary to COPD exacerbation, fluid overload and HCAP -presently on HFNC at 8 L -Normally on 4-5 L nasal cannula at home -Weaned oxygen back to baseline level -personally reviewed CXR--bibasilar opacities, chronic interstitial markings -lasix IV x 1  HCAP -continue vanco and cefepime -check procalcitonin--0.20 -follow blood cultures  COPD exacerbation -Very low functional reserve -continue duonebs -ContinuePulmicort -Continue brovana  Acute metabolic encephalopathy -secondary to infection -check UA--21-50WBC -more alert, but remains confused  Urinary retention/hematuria/high grade urothelial carcinoma -He has chronic indwellingFoley catheter -he was scheduled to start radiation to his bladder on 06/23/17, this has been delayed due to his numerous hospitalizations. -04/26/17--CYSTOSCOPY W/ FULGURATION / EVACUATION OF CLOTS, TURBT -Hemoglobin has remained stable  Drug induced hyperglycemia -check A1C--6.2 -due to steroids  previously  [place] -consult palliative medicine -pt is DNR  Hypokalemia/hypomagnesemia -repleted -check mag--1.6  Depression/Anxiety -continue home dose alprazolam, celexa, cymbalta    Disposition Plan:   SNF 2-3 days  Family Communication:  daughter updated at bedside 4/26--Total time spent 35 minutes.  Greater than 50% spent face to face counseling and coordinating care.  Consultants:  Palliative medicine  Code Status: DNR  DVT Prophylaxis:  [place] Lovenox   Procedures: As Listed in Progress Note Above  Antibiotics: Vanco 4/24>>> Cefepime 4/24>>       Subjective: Patient is more alert but he is pleasantly confused.  Denies any chest pain, nausea, vomiting, diarrhea, abdominal pain, headache, neck pain.  He has a nonproductive cough.  He is short of breath with minimal exertion.  He denies any hematochezia or melena.  There is no dysuria or hematuria.  Objective: Vitals:   08/08/17 0149 08/08/17 0525 08/08/17 0700 08/08/17 0803  BP:  132/79    Pulse:  85    Resp:      Temp:  98.1 F (36.7 C)    TempSrc:  Oral    SpO2: (!) 88% (!) 89% 91% (!) 84%  Weight:      Height:        Intake/Output Summary (Last 24 hours) at 08/08/2017 1058 Last data filed at 08/08/2017 0620 Gross per 24 hour  Intake 370 ml  Output 750 ml  Net -380 ml   Weight change:  Exam:   General:  Pt is alert, follows commands appropriately, not in acute distress  HEENT: No icterus, No thrush, No neck mass, [place]/AT  Cardiovascular: RRR, S1/S2, no rubs, no gallops  Respiratory: Scattered bilateral rales.  Bilateral wheezes.  Good air movement.  Abdomen: Soft/+BS, non tender, non distended, no guarding  Extremities: No edema, No lymphangitis, No petechiae, No rashes, no synovitis  Data Reviewed: I have personally reviewed following labs and imaging studies Basic Metabolic Panel: Recent Labs  Lab 08/06/17 2138 08/07/17 0629 08/08/17 0643  NA 139 139 136  K 2.9* 4.0  3.4*  CL 92* 95* 95*  CO2 32 33* 29  GLUCOSE 140* 115* 100*  BUN 29* 24* 15  CREATININE 1.15 1.01 0.92  CALCIUM 9.0 8.9 8.8*  MG  --  1.9 1.6*   Liver Function Tests: Recent Labs  Lab 08/07/17 0629 08/08/17 0643  AST 26 22  ALT 41 32  ALKPHOS 63 53  BILITOT 0.8 0.9  PROT 7.4 6.6  ALBUMIN 2.5* 2.2*   No results for input(s): LIPASE, AMYLASE in the last 168 hours. No results for input(s): AMMONIA in the last 168 hours. Coagulation Profile: No results for input(s): INR, PROTIME in the last 168 hours. CBC: Recent Labs  Lab 08/06/17 2138 08/07/17 0629 08/08/17 0643  WBC 21.7* 16.1* 12.9*  NEUTROABS 15.9  --   --   HGB 10.2* 10.1* 9.3*  HCT 35.2* 34.8* 32.3*  MCV 82.1 82.5 81.6  PLT 296 291 302   Cardiac Enzymes: Recent Labs  Lab 08/06/17 2138  TROPONINI 0.03*   BNP: Invalid input(s): POCBNP CBG: No results for input(s): GLUCAP in the last 168 hours. HbA1C: No results for input(s): HGBA1C in the last 72 hours. Urine analysis:    Component Value Date/Time   COLORURINE YELLOW 08/07/2017 1600   APPEARANCEUR HAZY (A) 08/07/2017 1600   APPEARANCEUR Turbid (A) 01/25/2016 1407   LABSPEC 1.015 08/07/2017 1600   PHURINE 6.0 08/07/2017 1600   GLUCOSEU NEGATIVE 08/07/2017 1600   HGBUR LARGE (A) 08/07/2017 1600   BILIRUBINUR NEGATIVE 08/07/2017 1600   BILIRUBINUR Negative 01/25/2016 1407   KETONESUR NEGATIVE 08/07/2017 1600   PROTEINUR 100 (A) 08/07/2017 1600   UROBILINOGEN 0.2 05/04/2014 0956   NITRITE NEGATIVE 08/07/2017 1600   LEUKOCYTESUR SMALL (A) 08/07/2017 1600   LEUKOCYTESUR 3+ (A) 01/25/2016 1407   Sepsis Labs: @LABRCNTIP (procalcitonin:4,lacticidven:4) ) Recent Results (from the past 240 hour(s))  Blood Culture (routine x 2)     Status: None (Preliminary result)   Collection Time: 08/07/17 12:07 AM  Result Value Ref Range Status   Specimen Description RIGHT ANTECUBITAL  Final   Special Requests   Final    BOTTLES DRAWN AEROBIC AND ANAEROBIC Blood  Culture adequate volume   Culture   Final    NO GROWTH 1 DAY Performed at [facility], [address], [place], [place] [address]    Report Status PENDING  Incomplete  Blood Culture (routine x 2)     Status: None (Preliminary result)   Collection Time: 08/07/17 12:14 AM  Result Value Ref Range Status   Specimen Description LEFT ANTECUBITAL  Final   Special Requests   Final    BOTTLES DRAWN AEROBIC AND ANAEROBIC Blood Culture adequate volume   Culture   Final    NO GROWTH 1 DAY Performed at [facility], [address] [place],  [address]    Report Status PENDING  Incomplete     Scheduled Meds: . arformoterol  15 mcg Nebulization BID  . budesonide (PULMICORT) nebulizer solution  0.5 mg Nebulization BID  . citalopram  20 mg Oral Daily  . DULoxetine  60 mg Oral Daily  . enoxaparin (LOVENOX) injection  40 mg Subcutaneous Q24H  . ipratropium-albuterol  3 mL Nebulization Q6H  . montelukast  10 mg Oral QHS  . oxybutynin  5 mg Oral TID  . pantoprazole  40  mg Oral Daily  . prochlorperazine  10 mg Oral Once per day on Mon Tue Wed Thu Fri  . roflumilast  500 mcg Oral Daily  . senna-docusate  1 tablet Oral BID   Continuous Infusions: . ceFEPime (MAXIPIME) IV Stopped (08/08/17 0626)  . vancomycin Stopped (08/08/17 0444)    Procedures/Studies: Dg Chest 2 View  Result Date: 08/06/2017 CLINICAL DATA:  Patient was diagnosed today with pneumonia at an outside facility. Here for re-evaluation. History of COPD and asthma. EXAM: CHEST - 2 VIEW COMPARISON:  07/28/2017 FINDINGS: Cardiac enlargement with mild pulmonary vascular congestion. Diffuse interstitial and alveolar infiltrates may indicate pneumonia or edema. There is more dense infiltration in the left lower lung posteriorly suggesting superimposed pneumonia. No blunting of costophrenic angles. No pneumothorax. Mediastinal contours appear intact. Calcification of the aorta. IMPRESSION: 1. Cardiac enlargement with pulmonary vascular  congestion and diffuse interstitial and alveolar infiltration suggesting edema. This could also represent multifocal pneumonia. 2. Focal area of more dense infiltration in the left lower lung posteriorly suggest superimposed pneumonia. Electronically Signed   By: Lucienne Capers M.D.   On: 08/06/2017 21:06   Ct Head Wo Contrast  Result Date: 07/28/2017 CLINICAL DATA:  Found unresponsive EXAM: CT HEAD WITHOUT CONTRAST TECHNIQUE: Contiguous axial images were obtained from the base of the skull through the vertex without intravenous contrast. COMPARISON:  09/24/2015 FINDINGS: Brain: There is atrophy and chronic small vessel disease changes. No acute intracranial abnormality. Specifically, no hemorrhage, hydrocephalus, mass lesion, acute infarction, or significant intracranial injury. Vascular: No hyperdense vessel or unexpected calcification. Skull: No acute calvarial abnormality. Sinuses/Orbits: Visualized paranasal sinuses and mastoids clear. Orbital soft tissues unremarkable. Other: None IMPRESSION: No acute intracranial abnormality. Atrophy, chronic microvascular disease. Electronically Signed   By: Rolm Baptise M.D.   On: 07/28/2017 09:08   Dg Chest Port 1 View  Result Date: 07/28/2017 CLINICAL DATA:  Altered mental status.  Shortness of breath. EXAM: PORTABLE CHEST 1 VIEW COMPARISON:  Radiograph of July 16, 2017. FINDINGS: Stable cardiomegaly with mild central pulmonary vascular congestion. No pneumothorax or pleural effusion is noted. Mild bibasilar subsegmental atelectasis or infiltrates is noted. Bony thorax is unremarkable. IMPRESSION: Stable bibasilar subsegmental atelectasis or infiltrates. Stable cardiomegaly with central pulmonary vascular congestion. Electronically Signed   By: Marijo Conception, M.D.   On: 07/28/2017 08:02   Dg Chest Port 1 View  Result Date: 07/16/2017 CLINICAL DATA:  Shortness of breath. Recent pneumonia. History of COPD. EXAM: PORTABLE CHEST 1 VIEW COMPARISON:  Chest  radiograph July 04, 2017 FINDINGS: Similar interstitial and alveolar airspace opacities most conspicuous in RIGHT lower lobe. No pleural effusion. Stable cardiomegaly. No pneumothorax. Soft tissue planes and included osseous structures are nonsuspicious. IMPRESSION: Similar interstitial and to lesser extent alveolar airspace opacities seen with pulmonary edema and/or pneumonia. Stable cardiomegaly. Electronically Signed   By: Elon Alas M.D.   On: 07/16/2017 13:47   US Abdomen Limited Ruq  Result Date: 07/28/2017 CLINICAL DATA:  Right upper quadrant pain EXAM: ULTRASOUND ABDOMEN LIMITED RIGHT UPPER QUADRANT COMPARISON:  CT abdomen and pelvis April 23, 2017 FINDINGS: Gallbladder: No gallstones or wall thickening visualized. There is no pericholecystic fluid. No sonographic Murphy sign noted by sonographer. Common bile duct: Diameter: 3 mm. No intrahepatic or extrahepatic biliary duct dilatation. Liver: No focal lesion identified. Within normal limits in parenchymal echogenicity. Portal vein is patent on color Doppler imaging with normal direction of blood flow towards the liver. IMPRESSION: Study within normal limits. Electronically Signed   By: Gwyndolyn Saxon  Jasmine December III M.D.   On: 07/28/2017 11:13    Orson Eva, DO  [facility] Pager [phone]  If 7PM-7AM, please contact night-coverage [email or web address] Password [email or web address] 08/08/2017, 10:58 AM   LOS: 0 days

## 2017-08-08 NOTE — Care Management Important Message (Signed)
Important Message  Patient Details  Name: [name] [name] MRN: [id number] Date of Birth: 12/09/1940   Medicare Important Message Given:  Yes    Sherald Barge, RN 08/08/2017, 3:43 PM

## 2017-08-09 LAB — BASIC METABOLIC PANEL
Anion gap: 11 (ref 5–15)
BUN: 12 mg/dL (ref 6–20)
CHLORIDE: 98 mmol/L — AB (ref 101–111)
CO2: 30 mmol/L (ref 22–32)
CREATININE: 0.99 mg/dL (ref 0.61–1.24)
Calcium: 8.9 mg/dL (ref 8.9–10.3)
GFR calc non Af Amer: >60 mL/min (ref >60)
GLUCOSE: 100 mg/dL — AB (ref 65–99)
Potassium: 3.4 mmol/L — ABNORMAL LOW (ref 3.5–5.1)
Sodium: 139 mmol/L (ref 135–145)

## 2017-08-09 LAB — PROCALCITONIN: Procalcitonin: <0.1 ng/mL

## 2017-08-09 LAB — CBC
HCT: 34.1 % — ABNORMAL LOW (ref 39.0–52.0)
Hemoglobin: 9.9 g/dL — ABNORMAL LOW (ref 13.0–17.0)
MCH: 23.7 pg — AB (ref 26.0–34.0)
MCHC: 29 g/dL — ABNORMAL LOW (ref 30.0–36.0)
MCV: 81.8 fL (ref 78.0–100.0)
PLATELETS: 304 10*3/uL (ref 150–400)
RBC: 4.17 MIL/uL — AB (ref 4.22–5.81)
RDW: 20.5 % — ABNORMAL HIGH (ref 11.5–15.5)
WBC: 9.8 10*3/uL (ref 4.0–10.5)

## 2017-08-09 LAB — URINE CULTURE: CULTURE: NO GROWTH

## 2017-08-09 LAB — MAGNESIUM: Magnesium: 2 mg/dL (ref 1.7–2.4)

## 2017-08-09 MED ORDER — MUPIROCIN 2 % EX OINT
1.0000 "application " | TOPICAL_OINTMENT | Freq: Two times a day (BID) | CUTANEOUS | Status: DC
  Administered 2017-08-09 – 2017-08-11 (×5): 1 via NASAL
  Filled 2017-08-09: qty 22

## 2017-08-09 MED ORDER — CHLORHEXIDINE GLUCONATE CLOTH 2 % EX PADS
6.0000 | MEDICATED_PAD | Freq: Every day | CUTANEOUS | Status: DC
  Administered 2017-08-09 – 2017-08-11 (×3): 6 via TOPICAL

## 2017-08-09 MED ORDER — METHYLPREDNISOLONE SODIUM SUCC 125 MG IJ SOLR
60.0000 mg | Freq: Two times a day (BID) | INTRAMUSCULAR | Status: DC
  Administered 2017-08-09 – 2017-08-11 (×4): 60 mg via INTRAVENOUS
  Filled 2017-08-09 (×4): qty 2

## 2017-08-09 MED ORDER — FUROSEMIDE 10 MG/ML IJ SOLN: 40.0000 mg | Freq: Once | INTRAMUSCULAR | Status: DC

## 2017-08-09 NOTE — Progress Notes (Signed)
PROGRESS NOTE  [name] [name]:[id number] DOB: 03-26-1941 DOA: 08/06/2017 PCP: [name], MD  Brief History: 77 year old male with a history of bladder cancer,COPD,chronic respiratory failure on 4 L oxygen, hyperlipidemia, depression, hyperlipidemia presenting with feverat [facility].Apparently, chest x-ray was performed and showed pneumonia.Staff received orders for antibiotics but the patient's daughter at bedside insisted that he be transferred to the hospital for further evaluation. The patient has had numerous hospital admissions in the last 6 months. Most recently, the patient was discharged from the hospital after a stay from 07/28/17 through 07/30/2017 for acute encephalopathy secondary to UTI. Upon presentation, he was noted to have WBC 21.7. He was started vancomycin and cefepime after blood cultures were obtained.    Assessment/Plan: Acute on chronic respiratory failure with hypoxia -Secondary to COPD exacerbation, fluid overloadand HCAP -presently on HFNC at 8 L -Normally on 4-5L nasal cannula at home -Weaned oxygen back to baseline level -personally reviewed CXR--bibasilar opacities, chronic interstitial markings -lasix IV x 1 on 4/26 and 4/27  HCAP -continue cefepime -d/c vanco -check procalcitonin--0.20 -follow blood cultures--neg  COPD exacerbation -Very low functional reserve -continue duonebs -ContinuePulmicort -Continue brovana -start IV solumedrol  Acute metabolic encephalopathy -secondary to infection -check UA--21-50WBC--culture with multiple organisms -more alert, but remains confused  Urinary retention/hematuria/high grade urothelial carcinoma -He has chronic indwellingFoley catheter -he was scheduled to start radiation to his bladder on 06/23/17, this has been delayed due to hisnumeroushospitalizations. -04/26/17--CYSTOSCOPY W/ FULGURATION / EVACUATION OF CLOTS, TURBT -Hemoglobin has remained stable  Drug  induced hyperglycemia -check A1C--6.2 -due to steroidspreviously  GOC -consult palliative medicine -pt is DNR  Hypokalemia/hypomagnesemia -repleted -check mag--1.6  Depression/Anxiety -continue home dose alprazolam, celexa, cymbalta    Disposition Plan:SNF 4/29 or 4/30 Family Communication:daughter updated at bedside 4/27  Consultants:Palliative medicine  Code Status: DNR  DVT Prophylaxis: [place] Lovenox   Procedures: As Listed in Progress Note Above  Antibiotics: Vanco 4/24>>>4/27 Cefepime 4/24>>      Subjective: Patient remains pleasantly confused.  He becomes agitated at nighttime.  He denies any chest pain, nausea, vomiting, diarrhea, abdominal pain.  He feels he is breathing better.  No headache or neck pain.  Objective: Vitals:   08/09/17 0600 08/09/17 0733 08/09/17 1309 08/09/17 1408  BP: 114/73  126/76   Pulse: 67  85   Resp: 18  20   Temp: 98.3 F (36.8 C)  97.6 F (36.4 C)   TempSrc: Oral  Oral   SpO2: 100% 94% 98% 96%  Weight:      Height:        Intake/Output Summary (Last 24 hours) at 08/09/2017 1646 Last data filed at 08/09/2017 1543 Gross per 24 hour  Intake 1250 ml  Output 600 ml  Net 650 ml   Weight change:  Exam:   General:  Pt is alert, follows commands appropriately, not in acute distress  HEENT: No icterus, No thrush, No neck mass, [place]/AT  Cardiovascular: RRR, S1/S2, no rubs, no gallops  Respiratory: Scattered bilateral rales.  Bibasilar wheezing.  Good air movement.  Abdomen: Soft/+BS, non tender, non distended, no guarding  Extremities: No edema, No lymphangitis, No petechiae, No rashes, no synovitis   Data Reviewed: I have personally reviewed following labs and imaging studies Basic Metabolic Panel: Recent Labs  Lab 08/06/17 2138 08/07/17 0629 08/08/17 0643 08/09/17 0718  NA 139 139 136 139  K 2.9* 4.0 3.4* 3.4*  CL 92* 95* 95* 98*  CO2 32 33*  29 30  GLUCOSE 140* 115* 100* 100*  BUN  29* 24* 15 12  CREATININE 1.15 1.01 0.92 0.99  CALCIUM 9.0 8.9 8.8* 8.9  MG  --  1.9 1.6*  --    Liver Function Tests: Recent Labs  Lab 08/07/17 0629 08/08/17 0643  AST 26 22  ALT 41 32  ALKPHOS 63 53  BILITOT 0.8 0.9  PROT 7.4 6.6  ALBUMIN 2.5* 2.2*   No results for input(s): LIPASE, AMYLASE in the last 168 hours. No results for input(s): AMMONIA in the last 168 hours. Coagulation Profile: No results for input(s): INR, PROTIME in the last 168 hours. CBC: Recent Labs  Lab 08/06/17 2138 08/07/17 0629 08/08/17 0643 08/09/17 0718  WBC 21.7* 16.1* 12.9* 9.8  NEUTROABS 15.9  --   --   --   HGB 10.2* 10.1* 9.3* 9.9*  HCT 35.2* 34.8* 32.3* 34.1*  MCV 82.1 82.5 81.6 81.8  PLT 296 291 302 304   Cardiac Enzymes: Recent Labs  Lab 08/06/17 2138  TROPONINI 0.03*   BNP: Invalid input(s): POCBNP CBG: No results for input(s): GLUCAP in the last 168 hours. HbA1C: No results for input(s): HGBA1C in the last 72 hours. Urine analysis:    Component Value Date/Time   COLORURINE YELLOW 08/07/2017 1600   APPEARANCEUR HAZY (A) 08/07/2017 1600   APPEARANCEUR Turbid (A) 01/25/2016 1407   LABSPEC 1.015 08/07/2017 1600   PHURINE 6.0 08/07/2017 1600   GLUCOSEU NEGATIVE 08/07/2017 1600   HGBUR LARGE (A) 08/07/2017 1600   BILIRUBINUR NEGATIVE 08/07/2017 1600   BILIRUBINUR Negative 01/25/2016 1407   KETONESUR NEGATIVE 08/07/2017 1600   PROTEINUR 100 (A) 08/07/2017 1600   UROBILINOGEN 0.2 05/04/2014 0956   NITRITE NEGATIVE 08/07/2017 1600   LEUKOCYTESUR SMALL (A) 08/07/2017 1600   LEUKOCYTESUR 3+ (A) 01/25/2016 1407   Sepsis Labs: @LABRCNTIP (procalcitonin:4,lacticidven:4) ) Recent Results (from the past 240 hour(s))  Blood Culture (routine x 2)     Status: None (Preliminary result)   Collection Time: 08/07/17 12:07 AM  Result Value Ref Range Status   Specimen Description RIGHT ANTECUBITAL  Final   Special Requests   Final    BOTTLES DRAWN AEROBIC AND ANAEROBIC Blood Culture  adequate volume   Culture   Final    NO GROWTH 2 DAYS Performed at [facility], [address], [place], [place] [address]    Report Status PENDING  Incomplete  Blood Culture (routine x 2)     Status: None (Preliminary result)   Collection Time: 08/07/17 12:14 AM  Result Value Ref Range Status   Specimen Description LEFT ANTECUBITAL  Final   Special Requests   Final    BOTTLES DRAWN AEROBIC AND ANAEROBIC Blood Culture adequate volume   Culture   Final    NO GROWTH 2 DAYS Performed at [facility], [address] [place], [place] [address]    Report Status PENDING  Incomplete  Culture, Urine     Status: None   Collection Time: 08/07/17  4:00 PM  Result Value Ref Range Status   Specimen Description   Final    URINE, RANDOM Performed at [facility], [address] [place], [place] [address]    Special Requests   Final    NONE Performed at [facility], [address] [place], [place] [address]    Culture   Final    NO GROWTH Performed at [facility], [place] [address] [place], [place] [address]    Report Status 08/09/2017 FINAL  Final  MRSA PCR Screening     Status: Abnormal   Collection Time: 08/08/17  6:40 AM  Result Value Ref Range Status   MRSA by PCR POSITIVE (A) NEGATIVE Final    Comment:        The GeneXpert MRSA Assay (FDA approved for NASAL specimens only), is one component of a comprehensive MRSA colonization surveillance program. It is not intended to diagnose MRSA infection nor to guide or monitor treatment for MRSA infections. RESULT CALLED TO, READ BACK BY AND VERIFIED WITH: [name] D. AT 1106A ON [id number] BY THOMPSON S. Performed at [facility], [address] [place], [place] [address]      Scheduled Meds: . arformoterol  15 mcg Nebulization BID  . budesonide (PULMICORT) nebulizer solution  0.5 mg Nebulization BID  . Chlorhexidine Gluconate Cloth  6 each Topical Q0600  . citalopram  20 mg Oral Daily  . DULoxetine  60 mg Oral Daily  .  enoxaparin (LOVENOX) injection  40 mg Subcutaneous Q24H  . ipratropium-albuterol  3 mL Nebulization Q6H  . methylPREDNISolone (SOLU-MEDROL) injection  60 mg Intravenous Q12H  . montelukast  10 mg Oral QHS  . mupirocin ointment  1 application Nasal BID  . oxybutynin  5 mg Oral TID  . pantoprazole  40 mg Oral Daily  . prochlorperazine  10 mg Oral Once per day on Mon Tue Wed Thu Fri  . roflumilast  500 mcg Oral Daily  . senna-docusate  1 tablet Oral BID   Continuous Infusions: . ceFEPime (MAXIPIME) IV Stopped (08/09/17 1430)    Procedures/Studies: Dg Chest 2 View  Result Date: 08/06/2017 CLINICAL DATA:  Patient was diagnosed today with pneumonia at an outside facility. Here for re-evaluation. History of COPD and asthma. EXAM: CHEST - 2 VIEW COMPARISON:  07/28/2017 FINDINGS: Cardiac enlargement with mild pulmonary vascular congestion. Diffuse interstitial and alveolar infiltrates may indicate pneumonia or edema. There is more dense infiltration in the left lower lung posteriorly suggesting superimposed pneumonia. No blunting of costophrenic angles. No pneumothorax. Mediastinal contours appear intact. Calcification of the aorta. IMPRESSION: 1. Cardiac enlargement with pulmonary vascular congestion and diffuse interstitial and alveolar infiltration suggesting edema. This could also represent multifocal pneumonia. 2. Focal area of more dense infiltration in the left lower lung posteriorly suggest superimposed pneumonia. Electronically Signed   By: [name] Capers M.D.   On: 08/06/2017 21:06   Ct Head Wo Contrast  Result Date: 07/28/2017 CLINICAL DATA:  Found unresponsive EXAM: CT HEAD WITHOUT CONTRAST TECHNIQUE: Contiguous axial images were obtained from the base of the skull through the vertex without intravenous contrast. COMPARISON:  09/24/2015 FINDINGS: Brain: There is atrophy and chronic small vessel disease changes. No acute intracranial abnormality. Specifically, no hemorrhage, hydrocephalus,  mass lesion, acute infarction, or significant intracranial injury. Vascular: No hyperdense vessel or unexpected calcification. Skull: No acute calvarial abnormality. Sinuses/Orbits: Visualized paranasal sinuses and mastoids clear. Orbital soft tissues unremarkable. Other: None IMPRESSION: No acute intracranial abnormality. Atrophy, chronic microvascular disease. Electronically Signed   By: [name] Baptise M.D.   On: 07/28/2017 09:08   Dg Chest Port 1 View  Result Date: 07/28/2017 CLINICAL DATA:  Altered mental status.  Shortness of breath. EXAM: PORTABLE CHEST 1 VIEW COMPARISON:  Radiograph of July 16, 2017. FINDINGS: Stable cardiomegaly with mild central pulmonary vascular congestion. No pneumothorax or pleural effusion is noted. Mild bibasilar subsegmental atelectasis or infiltrates is noted. Bony thorax is unremarkable. IMPRESSION: Stable bibasilar subsegmental atelectasis or infiltrates. Stable cardiomegaly with central pulmonary vascular congestion. Electronically Signed  By: [name] [name], M.D.   On: 07/28/2017 08:02   Dg Chest Port 1 View  Result Date: 07/16/2017 CLINICAL DATA:  Shortness of breath. Recent pneumonia. History of COPD. EXAM: PORTABLE CHEST 1 VIEW COMPARISON:  Chest radiograph July 04, 2017 FINDINGS: Similar interstitial and alveolar airspace opacities most conspicuous in RIGHT lower lobe. No pleural effusion. Stable cardiomegaly. No pneumothorax. Soft tissue planes and included osseous structures are nonsuspicious. IMPRESSION: Similar interstitial and to lesser extent alveolar airspace opacities seen with pulmonary edema and/or pneumonia. Stable cardiomegaly. Electronically Signed   By: [name] Alas M.D.   On: 07/16/2017 13:47   US Abdomen Limited Ruq  Result Date: 07/28/2017 CLINICAL DATA:  Right upper quadrant pain EXAM: ULTRASOUND ABDOMEN LIMITED RIGHT UPPER QUADRANT COMPARISON:  CT abdomen and pelvis April 23, 2017 FINDINGS: Gallbladder: No gallstones or wall thickening  visualized. There is no pericholecystic fluid. No sonographic Murphy sign noted by sonographer. Common bile duct: Diameter: 3 mm. No intrahepatic or extrahepatic biliary duct dilatation. Liver: No focal lesion identified. Within normal limits in parenchymal echogenicity. Portal vein is patent on color Doppler imaging with normal direction of blood flow towards the liver. IMPRESSION: Study within normal limits. Electronically Signed   By: [name] Grip III M.D.   On: 07/28/2017 11:13    [name] Eva, DO  [facility] Pager [phone]  If 7PM-7AM, please contact night-coverage [email or web address] Password [email or web address] 08/09/2017, 4:46 PM   LOS: 1 day

## 2017-08-10 DIAGNOSIS — D494 Neoplasm of unspecified behavior of bladder: Secondary | ICD-10-CM

## 2017-08-10 LAB — BASIC METABOLIC PANEL
ANION GAP: 13 (ref 5–15)
BUN: 14 mg/dL (ref 6–20)
CHLORIDE: 99 mmol/L — AB (ref 101–111)
CO2: 26 mmol/L (ref 22–32)
Calcium: 9.5 mg/dL (ref 8.9–10.3)
Creatinine, Ser: 0.77 mg/dL (ref 0.61–1.24)
GFR calc Af Amer: >60 mL/min (ref >60)
GLUCOSE: 175 mg/dL — AB (ref 65–99)
POTASSIUM: 3.7 mmol/L (ref 3.5–5.1)
Sodium: 138 mmol/L (ref 135–145)

## 2017-08-10 LAB — MAGNESIUM: Magnesium: 1.8 mg/dL (ref 1.7–2.4)

## 2017-08-10 MED ORDER — MAGNESIUM OXIDE 400 (241.3 MG) MG PO TABS
400.0000 mg | ORAL_TABLET | Freq: Every day | ORAL | Status: DC
  Administered 2017-08-10 – 2017-08-11 (×2): 400 mg via ORAL
  Filled 2017-08-10 (×2): qty 1

## 2017-08-10 MED ORDER — FUROSEMIDE 10 MG/ML IJ SOLN
40.0000 mg | Freq: Once | INTRAMUSCULAR | Status: AC
  Administered 2017-08-10: 40 mg via INTRAVENOUS
  Filled 2017-08-10: qty 4

## 2017-08-10 NOTE — Progress Notes (Signed)
Pharmacy Antibiotic Note  [name] [name] is a 77 y.o. male admitted on 08/06/2017 with pneumonia.  Pharmacy has been consulted for Vancomycin and Cefepime dosing.  Plan: Cefepime 2gm IV q8h F/U cxs and clinical progress Monitor V/S, labs   Height: 5\' 9"  (175.3 cm) Weight: 152 lb 11.2 oz (69.3 kg) IBW/kg (Calculated) : 70.7  Temp (24hrs), Avg:97.9 F (36.6 C), Min:97.6 F (36.4 C), Max:98.1 F (36.7 C)  Recent Labs  Lab 08/06/17 2138 08/07/17 0011 08/07/17 0629 08/08/17 0643 08/09/17 0718  WBC 21.7*  --  16.1* 12.9* 9.8  CREATININE 1.15  --  1.01 0.92 0.99  LATICACIDVEN  --  0.79  --   --   --     Estimated Creatinine Clearance: 63.2 mL/min (by C-G formula based on SCr of 0.99 mg/dL).    Allergies  Allergen Reactions  . Metrizamide Hives  . Codeine   . Iohexol Hives     Code: HIVES, Desc: PT STATES HE BROKE OUT IN HIVES AND RASH AFTER CT NECK EARLY SEPT 2011; NO RESP PROBLEMS; NEEDS PRE-MEDS; MKS, Onset Date: [id number]   . Ivp Dye [Iodinated Diagnostic Agents] Hives  . Other     Broccoli, Cauliflower     Antimicrobials this admission: Vancomycin 4/25 >> 4/27  Cefepime 4/25 >>   Dose adjustments this admission: n/a  Microbiology results: 4/25 BCx: ngtd 4/25 UCx: no growth   Thank you for allowing pharmacy to be a part of this patient's care.   Margot Ables, PharmD Clinical Pharmacist 08/10/2017 8:18 AM

## 2017-08-10 NOTE — Progress Notes (Signed)
PROGRESS NOTE  [name] [name] MVE:[id number] DOB: 1940-12-18 DOA: 08/06/2017 PCP: [name], MD  Brief History: 77 year old male with a history of bladder cancer,COPD,chronic respiratory failure on 4 L oxygen, hyperlipidemia, depression, hyperlipidemia presenting with feverat [place].Apparently, chest x-ray was performed and showed pneumonia.Staff received orders for antibiotics but the patient's daughter at bedside insisted that he be transferred to the hospital for further evaluation. The patient has had numerous hospital admissions in the last 6 months. Most recently, the patient was discharged from the hospital after a stay from 07/28/17 through 07/30/2017 for acute encephalopathy secondary to UTI. Upon presentation, he was noted to have WBC 21.7. He was started vancomycin and cefepime after blood cultures were obtained.    Assessment/Plan: Acute on chronic respiratory failure with hypoxia -Secondary to COPD exacerbation, fluid overloadand HCAP -presently on HFNC at 8 L -Normally on 4-5L nasal cannula at home -Weaned oxygen back to baseline level -personally reviewed CXR--bibasilar opacities, chronic interstitial markings -lasix IV x 1 on 4/26 and 4/28  HCAP -continue cefepime -d/c vanco -check procalcitonin--0.20 -follow blood cultures--neg  COPD exacerbation -Very low functional reserve -continueduonebs -ContinuePulmicort -Continuebrovana -continue IV solumedrol  Acute metabolic encephalopathy -secondary to infection -check UA--21-50WBC--culture with multiple organisms -more alert, butremains confused  Urinary retention/hematuria/high grade urothelial carcinoma -He has chronic indwellingFoley catheter -he was scheduled to start radiation to his bladder on 06/23/17, this has been delayed due to hisnumeroushospitalizations. -04/26/17--CYSTOSCOPY W/ FULGURATION / EVACUATION OF CLOTS, TURBT -Hemoglobin has remained  stable  Drug induced hyperglycemia -check A1C--6.2 -due to steroidspreviously  GOC -consult palliative medicine -pt is DNR  Hypokalemia/hypomagnesemia -repleted -check mag--1.6 -start mag ox  Depression/Anxiety -continue home dose alprazolam, celexa, cymbalta    Disposition Plan:SNF 4/29 or 4/30 Family Communication:daughter updatedat bedside 4/27  Consultants:Palliative medicine  Code Status: DNR  DVT Prophylaxis: [place] Lovenox   Procedures: As Listed in Progress Note Above  Antibiotics: Vanco 4/24>>>4/27 Cefepime 4/24>>       Subjective: Patient denies fevers, chills, headache, chest pain, dyspnea, nausea, vomiting, diarrhea, abdominal pain, dysuria, hematuria, hematochezia, and melena.   Objective: Vitals:   08/10/17 0459 08/10/17 0726 08/10/17 1316 08/10/17 1501  BP: 138/81  121/73   Pulse: 78  90   Resp:   20   Temp: 98 F (36.7 C)  98.2 F (36.8 C)   TempSrc: Oral  Oral   SpO2: 94% 93% 98% 93%  Weight:      Height:        Intake/Output Summary (Last 24 hours) at 08/10/2017 1631 Last data filed at 08/10/2017 0900 Gross per 24 hour  Intake 480 ml  Output 600 ml  Net -120 ml   Weight change:  Exam:   General:  Pt is alert, follows commands appropriately, not in acute distress  HEENT: No icterus, No thrush, No neck mass, [place]/AT  Cardiovascular: RRR, S1/S2, no rubs, no gallops  Respiratory: Bibasilar rales.  Bibasilar expiratory wheeze.  Good air movement.  Abdomen: Soft/+BS, non tender, non distended, no guarding  Extremities: No edema, No lymphangitis, No petechiae, No rashes, no synovitis   Data Reviewed: I have personally reviewed following labs and imaging studies Basic Metabolic Panel: Recent Labs  Lab 08/06/17 2138 08/07/17 0629 08/08/17 0643 08/09/17 0718 08/09/17 1608 08/10/17 0707  NA 139 139 136 139  --  138  K 2.9* 4.0 3.4* 3.4*  --  3.7  CL 92* 95* 95* 98*  --  99*  CO2 32  33* 29 30  --   26  GLUCOSE 140* 115* 100* 100*  --  175*  BUN 29* 24* 15 12  --  14  CREATININE 1.15 1.01 0.92 0.99  --  0.77  CALCIUM 9.0 8.9 8.8* 8.9  --  9.5  MG  --  1.9 1.6*  --  2.0 1.8   Liver Function Tests: Recent Labs  Lab 08/07/17 0629 08/08/17 0643  AST 26 22  ALT 41 32  ALKPHOS 63 53  BILITOT 0.8 0.9  PROT 7.4 6.6  ALBUMIN 2.5* 2.2*   No results for input(s): LIPASE, AMYLASE in the last 168 hours. No results for input(s): AMMONIA in the last 168 hours. Coagulation Profile: No results for input(s): INR, PROTIME in the last 168 hours. CBC: Recent Labs  Lab 08/06/17 2138 08/07/17 0629 08/08/17 0643 08/09/17 0718  WBC 21.7* 16.1* 12.9* 9.8  NEUTROABS 15.9  --   --   --   HGB 10.2* 10.1* 9.3* 9.9*  HCT 35.2* 34.8* 32.3* 34.1*  MCV 82.1 82.5 81.6 81.8  PLT 296 291 302 304   Cardiac Enzymes: Recent Labs  Lab 08/06/17 2138  TROPONINI 0.03*   BNP: Invalid input(s): POCBNP CBG: No results for input(s): GLUCAP in the last 168 hours. HbA1C: No results for input(s): HGBA1C in the last 72 hours. Urine analysis:    Component Value Date/Time   COLORURINE YELLOW 08/07/2017 1600   APPEARANCEUR HAZY (A) 08/07/2017 1600   APPEARANCEUR Turbid (A) 01/25/2016 1407   LABSPEC 1.015 08/07/2017 1600   PHURINE 6.0 08/07/2017 1600   GLUCOSEU NEGATIVE 08/07/2017 1600   HGBUR LARGE (A) 08/07/2017 1600   BILIRUBINUR NEGATIVE 08/07/2017 1600   BILIRUBINUR Negative 01/25/2016 1407   KETONESUR NEGATIVE 08/07/2017 1600   PROTEINUR 100 (A) 08/07/2017 1600   UROBILINOGEN 0.2 05/04/2014 0956   NITRITE NEGATIVE 08/07/2017 1600   LEUKOCYTESUR SMALL (A) 08/07/2017 1600   LEUKOCYTESUR 3+ (A) 01/25/2016 1407   Sepsis Labs: @LABRCNTIP (procalcitonin:4,lacticidven:4) ) Recent Results (from the past 240 hour(s))  Blood Culture (routine x 2)     Status: None (Preliminary result)   Collection Time: 08/07/17 12:07 AM  Result Value Ref Range Status   Specimen Description RIGHT ANTECUBITAL  Final    Special Requests   Final    BOTTLES DRAWN AEROBIC AND ANAEROBIC Blood Culture adequate volume   Culture   Final    NO GROWTH 3 DAYS Performed at [facility], [address] [place], [place] [address]    Report Status PENDING  Incomplete  Blood Culture (routine x 2)     Status: None (Preliminary result)   Collection Time: 08/07/17 12:14 AM  Result Value Ref Range Status   Specimen Description LEFT ANTECUBITAL  Final   Special Requests   Final    BOTTLES DRAWN AEROBIC AND ANAEROBIC Blood Culture adequate volume   Culture   Final    NO GROWTH 3 DAYS Performed at [facility], [address], [place], [place] [address]    Report Status PENDING  Incomplete  Culture, Urine     Status: None   Collection Time: 08/07/17  4:00 PM  Result Value Ref Range Status   Specimen Description   Final    URINE, RANDOM Performed at [facility], [address], [place], [place] [address]    Special Requests   Final    NONE Performed at [facility], [address], [place], [place] [address]    Culture   Final    NO GROWTH  Performed at [facility], [place] [address] [place], [place] [address]    Report Status 08/09/2017 FINAL  Final  MRSA PCR Screening     Status: Abnormal   Collection Time: 08/08/17  6:40 AM  Result Value Ref Range Status   MRSA by PCR POSITIVE (A) NEGATIVE Final    Comment:        The GeneXpert MRSA Assay (FDA approved for NASAL specimens only), is one component of a comprehensive MRSA colonization surveillance program. It is not intended to diagnose MRSA infection nor to guide or monitor treatment for MRSA infections. RESULT CALLED TO, READ BACK BY AND VERIFIED WITH: MARTIN D. AT 1106A ON [id number] BY THOMPSON S. Performed at [facility], [address], [place],  [address]      Scheduled Meds: . arformoterol  15 mcg Nebulization BID  . budesonide (PULMICORT) nebulizer solution  0.5 mg Nebulization BID  . Chlorhexidine Gluconate Cloth  6 each Topical  Q0600  . citalopram  20 mg Oral Daily  . DULoxetine  60 mg Oral Daily  . enoxaparin (LOVENOX) injection  40 mg Subcutaneous Q24H  . furosemide  40 mg Intravenous Once  . ipratropium-albuterol  3 mL Nebulization Q6H  . methylPREDNISolone (SOLU-MEDROL) injection  60 mg Intravenous Q12H  . montelukast  10 mg Oral QHS  . mupirocin ointment  1 application Nasal BID  . oxybutynin  5 mg Oral TID  . pantoprazole  40 mg Oral Daily  . roflumilast  500 mcg Oral Daily  . senna-docusate  1 tablet Oral BID   Continuous Infusions: . ceFEPime (MAXIPIME) IV 2 g (08/10/17 1413)    Procedures/Studies: Dg Chest 2 View  Result Date: 08/06/2017 CLINICAL DATA:  Patient was diagnosed today with pneumonia at an outside facility. Here for re-evaluation. History of COPD and asthma. EXAM: CHEST - 2 VIEW COMPARISON:  07/28/2017 FINDINGS: Cardiac enlargement with mild pulmonary vascular congestion. Diffuse interstitial and alveolar infiltrates may indicate pneumonia or edema. There is more dense infiltration in the left lower lung posteriorly suggesting superimposed pneumonia. No blunting of costophrenic angles. No pneumothorax. Mediastinal contours appear intact. Calcification of the aorta. IMPRESSION: 1. Cardiac enlargement with pulmonary vascular congestion and diffuse interstitial and alveolar infiltration suggesting edema. This could also represent multifocal pneumonia. 2. Focal area of more dense infiltration in the left lower lung posteriorly suggest superimposed pneumonia. Electronically Signed   By: Lucienne Capers M.D.   On: 08/06/2017 21:06   Ct Head Wo Contrast  Result Date: 07/28/2017 CLINICAL DATA:  Found unresponsive EXAM: CT HEAD WITHOUT CONTRAST TECHNIQUE: Contiguous axial images were obtained from the base of the skull through the vertex without intravenous contrast. COMPARISON:  09/24/2015 FINDINGS: Brain: There is atrophy and chronic small vessel disease changes. No acute intracranial abnormality.  Specifically, no hemorrhage, hydrocephalus, mass lesion, acute infarction, or significant intracranial injury. Vascular: No hyperdense vessel or unexpected calcification. Skull: No acute calvarial abnormality. Sinuses/Orbits: Visualized paranasal sinuses and mastoids clear. Orbital soft tissues unremarkable. Other: None IMPRESSION: No acute intracranial abnormality. Atrophy, chronic microvascular disease. Electronically Signed   By: Rolm Baptise M.D.   On: 07/28/2017 09:08   Dg Chest Port 1 View  Result Date: 07/28/2017 CLINICAL DATA:  Altered mental status.  Shortness of breath. EXAM: PORTABLE CHEST 1 VIEW COMPARISON:  Radiograph of July 16, 2017. FINDINGS: Stable cardiomegaly with mild central pulmonary vascular congestion. No pneumothorax or pleural effusion is noted. Mild bibasilar subsegmental atelectasis or infiltrates is noted. Bony thorax is unremarkable. IMPRESSION:  Stable bibasilar subsegmental atelectasis or infiltrates. Stable cardiomegaly with central pulmonary vascular congestion. Electronically Signed   By: Marijo Conception, M.D.   On: 07/28/2017 08:02   Dg Chest Port 1 View  Result Date: 07/16/2017 CLINICAL DATA:  Shortness of breath. Recent pneumonia. History of COPD. EXAM: PORTABLE CHEST 1 VIEW COMPARISON:  Chest radiograph July 04, 2017 FINDINGS: Similar interstitial and alveolar airspace opacities most conspicuous in RIGHT lower lobe. No pleural effusion. Stable cardiomegaly. No pneumothorax. Soft tissue planes and included osseous structures are nonsuspicious. IMPRESSION: Similar interstitial and to lesser extent alveolar airspace opacities seen with pulmonary edema and/or pneumonia. Stable cardiomegaly. Electronically Signed   By: Elon Alas M.D.   On: 07/16/2017 13:47   US Abdomen Limited Ruq  Result Date: 07/28/2017 CLINICAL DATA:  Right upper quadrant pain EXAM: ULTRASOUND ABDOMEN LIMITED RIGHT UPPER QUADRANT COMPARISON:  CT abdomen and pelvis April 23, 2017 FINDINGS:  Gallbladder: No gallstones or wall thickening visualized. There is no pericholecystic fluid. No sonographic Murphy sign noted by sonographer. Common bile duct: Diameter: 3 mm. No intrahepatic or extrahepatic biliary duct dilatation. Liver: No focal lesion identified. Within normal limits in parenchymal echogenicity. Portal vein is patent on color Doppler imaging with normal direction of blood flow towards the liver. IMPRESSION: Study within normal limits. Electronically Signed   By: Lowella Grip III M.D.   On: 07/28/2017 11:13    Orson Eva, DO  [facility] Pager [phone]  If 7PM-7AM, please contact night-coverage [email or web address] Password [email or web address] 08/10/2017, 4:31 PM   LOS: 2 days

## 2017-08-11 LAB — BASIC METABOLIC PANEL
ANION GAP: 11 (ref 5–15)
BUN: 17 mg/dL (ref 6–20)
CALCIUM: 9.5 mg/dL (ref 8.9–10.3)
CO2: 30 mmol/L (ref 22–32)
CREATININE: 0.84 mg/dL (ref 0.61–1.24)
Chloride: 96 mmol/L — ABNORMAL LOW (ref 101–111)
Glucose, Bld: 160 mg/dL — ABNORMAL HIGH (ref 65–99)
Potassium: 3 mmol/L — ABNORMAL LOW (ref 3.5–5.1)
SODIUM: 137 mmol/L (ref 135–145)

## 2017-08-11 LAB — MAGNESIUM: MAGNESIUM: 1.8 mg/dL (ref 1.7–2.4)

## 2017-08-11 MED ORDER — OXYCODONE HCL 5 MG PO TABS: 5.0000 mg | ORAL_TABLET | Freq: Four times a day (QID) | ORAL | 0 refills | Status: AC | PRN

## 2017-08-11 MED ORDER — POTASSIUM CHLORIDE CRYS ER 20 MEQ PO TBCR
40.0000 meq | EXTENDED_RELEASE_TABLET | Freq: Once | ORAL | Status: AC
  Administered 2017-08-11: 40 meq via ORAL
  Filled 2017-08-11: qty 2

## 2017-08-11 MED ORDER — PREDNISONE 10 MG PO TABS: 60.0000 mg | ORAL_TABLET | Freq: Every day | ORAL | 0 refills | Status: DC

## 2017-08-11 MED ORDER — ALPRAZOLAM 1 MG PO TABS: 1.0000 mg | ORAL_TABLET | Freq: Three times a day (TID) | ORAL | 0 refills | Status: AC | PRN

## 2017-08-11 MED ORDER — MAGNESIUM SULFATE 2 GM/50ML IV SOLN
2.0000 g | Freq: Once | INTRAVENOUS | Status: AC
  Administered 2017-08-11: 2 g via INTRAVENOUS
  Filled 2017-08-11: qty 50

## 2017-08-11 MED ORDER — AMOXICILLIN-POT CLAVULANATE 875-125 MG PO TABS: 1.0000 | ORAL_TABLET | Freq: Two times a day (BID) | ORAL | 0 refills | Status: DC

## 2017-08-11 MED ORDER — MAGNESIUM OXIDE 400 (241.3 MG) MG PO TABS: 400.0000 mg | ORAL_TABLET | Freq: Every day | ORAL | 0 refills | Status: AC

## 2017-08-11 NOTE — Evaluation (Signed)
Physical Therapy Evaluation Patient Details Name: [name] [name] MRN: [id number] DOB: 07-06-1940 Today's Date: 08/11/2017   History of Present Illness  [name] [name] is a 77 y.o. male with medical history significant for hypertension, COPD chronically on 4 L nasal cannula, dyslipidemia, hypertension, and bladder cancer who was recently discharged on 4/17 for hospice care at skilled nursing facility returns to the emergency department today with noted fever at the facility with chest x-ray demonstrating pneumonia.  Staff was going to initiate antibiotics, but patient's daughter persisted that the patient come to the hospital for further evaluation.  He has had some mild confusion as well but this has been intermittent.  She cannot give any significant history on account of some of his confusion.    Clinical Impression  Patient limited to a few steps at bedside mostly due to SOB, very unsteady on feet with frequent loss of balance using RW, persistent coughing and tolerated sitting up in chair after therapy - RN notified.  Patient will benefit from continued physical therapy in hospital and recommended venue below to increase strength, balance, endurance for safe ADLs and gait.    Follow Up Recommendations SNF    Equipment Recommendations  None recommended by PT    Recommendations for Other Services       Precautions / Restrictions Precautions Precautions: Fall Restrictions Weight Bearing Restrictions: No      Mobility  Bed Mobility Overal bed mobility: Needs Assistance Bed Mobility: Supine to Sit     Supine to sit: Min guard        Transfers Overall transfer level: Needs assistance Equipment used: Rolling walker (2 wheeled) Transfers: Sit to/from [facility] Sit to Stand: Min assist Stand pivot transfers: Mod assist       General transfer comment: slow labored unsteady movement  Ambulation/Gait Ambulation/Gait assistance: Mod assist;Max  assist Ambulation Distance (Feet): 3 Feet Assistive device: Rolling walker (2 wheeled) Gait Pattern/deviations: Decreased step length - left;Decreased stance time - right;Decreased stride length     General Gait Details: limited to 4-5 unsteady steps with near loss of balance, limited secondary to SOB while on 4 LPM  Stairs            Wheelchair Mobility    Modified Rankin (Stroke Patients Only)       Balance Overall balance assessment: Needs assistance Sitting-balance support: Feet supported;No upper extremity supported Sitting balance-Leahy Scale: Good     Standing balance support: Bilateral upper extremity supported;During functional activity Standing balance-Leahy Scale: Poor Standing balance comment: fair/poor with RW                             Pertinent Vitals/Pain Pain Assessment: No/denies pain    Home Living Family/patient expects to be discharged to:: Skilled nursing facility               Home Equipment: Gilford Rile - 4 wheels;Bedside commode;Shower seat      Prior Function                 Hand Dominance        Extremity/Trunk Assessment   Upper Extremity Assessment Upper Extremity Assessment: Generalized weakness    Lower Extremity Assessment Lower Extremity Assessment: Generalized weakness    Cervical / Trunk Assessment Cervical / Trunk Assessment: Normal  Communication   Communication: No difficulties(slightly difficult to undestand due to possible confusion)  Cognition Arousal/Alertness: Awake/alert Behavior During Therapy: WFL for tasks assessed/performed Overall  Cognitive Status: Within Functional Limits for tasks assessed                                        General Comments      Exercises     Assessment/Plan    PT Assessment Patient needs continued PT services  PT Problem List Decreased strength;Decreased activity tolerance;Decreased mobility;Decreased balance       PT Treatment  Interventions Gait training;Functional mobility training;Therapeutic activities;Therapeutic exercise;Patient/family education    PT Goals (Current goals can be found in the Care Plan section)  Acute Rehab PT Goals Patient Stated Goal: return home  PT Goal Formulation: With patient Time For Goal Achievement: 08/25/17 Potential to Achieve Goals: Good    Frequency Min 3X/week   Barriers to discharge        Co-evaluation               AM-PAC PT "6 Clicks" Daily Activity  Outcome Measure Difficulty turning over in bed (including adjusting bedclothes, sheets and blankets)?: None Difficulty moving from lying on back to sitting on the side of the bed? : A Little Difficulty sitting down on and standing up from a chair with arms (e.g., wheelchair, bedside commode, etc,.)?: A Lot Help needed moving to and from a bed to chair (including a wheelchair)?: A Lot Help needed walking in hospital room?: A Lot Help needed climbing 3-5 steps with a railing? : Total 6 Click Score: 14    End of Session Equipment Utilized During Treatment: Gait belt Activity Tolerance: Patient limited by fatigue;Patient tolerated treatment well Patient left: in chair;with call bell/phone within reach;with chair alarm set Nurse Communication: Mobility status;Other (comment)(RN notified patient left up in chair) PT Visit Diagnosis: Unsteadiness on feet (R26.81);Other abnormalities of gait and mobility (R26.89);Muscle weakness (generalized) (M62.81)    Time: 0263-7858 PT Time Calculation (min) (ACUTE ONLY): 27 min   Charges:   PT Evaluation $PT Eval Moderate Complexity: 1 Mod PT Treatments $Therapeutic Activity: 23-37 mins   PT G Codes:        12:06 PM, Aug 20, 2017 Lonell Grandchild, MPT Physical Therapist with [facility] [phone] office [phone] mobile phone

## 2017-08-11 NOTE — Plan of Care (Signed)
  Problem: Acute Rehab PT Goals(only PT should resolve) Goal: Pt Will Go Supine/Side To Sit Outcome: Progressing Flowsheets (Taken 08/11/2017 1209) Pt will go Supine/Side to Sit: with supervision Goal: Patient Will Transfer Sit To/From Stand Outcome: Progressing Flowsheets (Taken 08/11/2017 1209) Patient will transfer sit to/from stand: with minimal assist Goal: Pt Will Transfer Bed To Chair/Chair To Bed Outcome: Progressing Flowsheets (Taken 08/11/2017 1209) Pt will Transfer Bed to Chair/Chair to Bed: with min assist Goal: Pt Will Ambulate Outcome: Progressing Flowsheets (Taken 08/11/2017 1209) Pt will Ambulate: 50 feet;with minimal assist;with rolling walker  12:13 PM, 08/11/17 Lonell Grandchild, MPT Physical Therapist with [facility] [phone] office [phone] mobile phone

## 2017-08-11 NOTE — Clinical Social Work Note (Signed)
LCSW following. Pt stable to return to [facility] today. DC clinical sent through the hub to the SNF. Rudi Heap at the facility. Updated pt's RN who will call report to [phone] hall RN at the SNF.  EMS transport arranged with requested pickup time of 1pm.   Spoke with pt's daughter by phone to update. She is aware that pt will transfer around 1:00.  There are no other SW needs for dc.

## 2017-08-11 NOTE — Progress Notes (Signed)
Patient being discharged back to [facility].  IV removed - WNL.  Report called and given to carol at the facility.  All questions answered.  Awaiting arrival of EMS to transport.  Foley left in place - chronic.  Patient in NAD at this time

## 2017-08-11 NOTE — Care Management Important Message (Signed)
Important Message  Patient Details  Name: [name] [name] MRN: [id number] Date of Birth: 03-10-1941   Medicare Important Message Given:  Yes    Edris, Friedt 08/11/2017, 12:06 PM

## 2017-08-11 NOTE — Discharge Summary (Signed)
Physician Discharge Summary  [name] [name] GYI:[id number] DOB: 10/30/1940 DOA: 08/06/2017  PCP: [name], MD  Admit date: 08/06/2017 Discharge date: 08/11/2017  Admitted From: SNF Disposition:   SNF  Recommendations for Outpatient Follow-up:  1. Follow up with PCP in 1-2 weeks 2. Please obtain BMP/CBC in one week    Discharge Condition: Stable CODE STATUS: DNR Diet recommendation: Heart Healthy / Carb Modified / Dysphagia / Regular   Brief/Interim Summary: 77 year old male with a history of bladder cancer,COPD,chronic respiratory failure on 4 L oxygen, hyperlipidemia, depression, hyperlipidemia presenting with feverat [place].Apparently, chest x-ray was performed and showed pneumonia.Staff received orders for antibiotics but the patient's daughter at bedside insisted that he be transferred to the hospital for further evaluation. The patient has had numerous hospital admissions in the last 6 months. Most recently, the patient was discharged from the hospital after a stay from 07/28/17 through 07/30/2017 for acute encephalopathy secondary to UTI. Upon presentation, he was noted to have WBC 21.7. He was started vancomycin and cefepime after blood cultures were obtained.  He was also noted to have COPD exacerbation and started on IV steroids.  He slowly improved and was weaned back to his home amount of oxygen.  After speaking with patient's family, they want him to return to [facility].   Discharge Diagnoses:  Acute on chronic respiratory failure with hypoxia -Secondary to COPD exacerbation, fluid overloadand HCAP -presently on HFNC at 8 L -Normally on 4-5L nasal cannula at home -Weaned oxygen back to baseline level -personally reviewed CXR--bibasilar opacities, chronic interstitial markings -lasix IV x 1on 4/26 and 4/28  HCAP -continue cefepime>>>d/c with amox/clav x 3 more days -d/c vanco -check procalcitonin--0.20 -follow blood cultures--neg  COPD  exacerbation -Very low functional reserve -continueduonebs -ContinuePulmicort -Continuebrovana -continue IV solumedrol>>d/c with prednisone taper  Acute metabolic encephalopathy -secondary to infection -check UA--21-50WBC--culture with multiple organisms -more alert, butremains confused  Urinary retention/hematuria/high grade urothelial carcinoma -He has chronic indwellingFoley catheter -he was scheduled to start radiation to his bladder on 06/23/17, this has been delayed due to hisnumeroushospitalizations. -04/26/17--CYSTOSCOPY W/ FULGURATION / EVACUATION OF CLOTS, TURBT -Hemoglobin has remained stable  Drug induced hyperglycemia -check A1C--6.2 -due to steroidspreviously  GOC -consult palliative medicine -pt is DNR  Hypokalemia/hypomagnesemia -repleted -check mag--1.6 -start mag ox 400 mg daily  Depression/Anxiety -continue home dose alprazolam, celexa, cymbalta      Discharge Instructions   Allergies as of 08/11/2017      Reactions   Metrizamide Hives   Codeine    Iohexol Hives    Code: HIVES, Desc: PT STATES HE BROKE OUT IN HIVES AND RASH AFTER CT NECK EARLY SEPT 2011; NO RESP PROBLEMS; NEEDS PRE-MEDS; MKS, Onset Date: [id number]   Ivp Dye [iodinated Diagnostic Agents] Hives   Other    Broccoli, Cauliflower       Medication List    STOP taking these medications   prochlorperazine 10 MG tablet Commonly known as:  COMPAZINE     TAKE these medications   acetaminophen 325 MG tablet Commonly known as:  TYLENOL Take 650 mg by mouth every 6 (six) hours as needed for headache.   albuterol (2.5 MG/3ML) 0.083% nebulizer solution Commonly known as:  PROVENTIL Take 3 mLs (2.5 mg total) by nebulization every 4 (four) hours as needed. For shortness of breath What changed:    when to take this  additional instructions   ALPRAZolam 1 MG tablet Commonly known as:  XANAX Take 1 tablet (1 mg total) by mouth 3 (three)  times daily as needed for  anxiety.   amoxicillin-clavulanate 875-125 MG tablet Commonly known as:  AUGMENTIN Take 1 tablet by mouth 2 (two) times daily.   citalopram 20 MG tablet Commonly known as:  CELEXA Take 20 mg by mouth daily.   DULoxetine 60 MG capsule Commonly known as:  CYMBALTA Take 1 capsule (60 mg total) by mouth daily.   furosemide 20 MG tablet Commonly known as:  LASIX Take 20 mg by mouth daily.   magnesium oxide 400 (241.3 Mg) MG tablet Commonly known as:  MAG-OX Take 1 tablet (400 mg total) by mouth daily. Start taking on:  08/12/2017   montelukast 10 MG tablet Commonly known as:  SINGULAIR Take 10 mg by mouth at bedtime.   omeprazole 20 MG capsule Commonly known as:  PRILOSEC Take 20 mg by mouth daily.   oxybutynin 5 MG tablet Commonly known as:  DITROPAN Take 5 mg by mouth 3 (three) times daily.   oxyCODONE 5 MG immediate release tablet Commonly known as:  Oxy IR/ROXICODONE Take 1 tablet (5 mg total) by mouth every 6 (six) hours as needed for severe pain. What changed:    when to take this  reasons to take this   OXYGEN Inhale 6 L into the lungs continuous.   predniSONE 10 MG tablet Commonly known as:  DELTASONE Take 6 tablets (60 mg total) by mouth daily with breakfast. And decrease by one tablet daily Start taking on:  08/12/2017   roflumilast 500 MCG Tabs tablet Commonly known as:  DALIRESP Take 1 Tablet by mouth once daily   senna-docusate 8.6-50 MG tablet Commonly known as:  Senokot-S Take 1 tablet by mouth 2 (two) times daily.       Allergies  Allergen Reactions  . Metrizamide Hives  . Codeine   . Iohexol Hives     Code: HIVES, Desc: PT STATES HE BROKE OUT IN HIVES AND RASH AFTER CT NECK EARLY SEPT 2011; NO RESP PROBLEMS; NEEDS PRE-MEDS; MKS, Onset Date: [id number]   . Ivp Dye [Iodinated Diagnostic Agents] Hives  . Other     Broccoli, Cauliflower     Consultations:  Palliative medicine   Procedures/Studies: Dg Chest 2 View  Result Date:  08/06/2017 CLINICAL DATA:  Patient was diagnosed today with pneumonia at an outside facility. Here for re-evaluation. History of COPD and asthma. EXAM: CHEST - 2 VIEW COMPARISON:  07/28/2017 FINDINGS: Cardiac enlargement with mild pulmonary vascular congestion. Diffuse interstitial and alveolar infiltrates may indicate pneumonia or edema. There is more dense infiltration in the left lower lung posteriorly suggesting superimposed pneumonia. No blunting of costophrenic angles. No pneumothorax. Mediastinal contours appear intact. Calcification of the aorta. IMPRESSION: 1. Cardiac enlargement with pulmonary vascular congestion and diffuse interstitial and alveolar infiltration suggesting edema. This could also represent multifocal pneumonia. 2. Focal area of more dense infiltration in the left lower lung posteriorly suggest superimposed pneumonia. Electronically Signed   By: Lucienne Capers M.D.   On: 08/06/2017 21:06   Ct Head Wo Contrast  Result Date: 07/28/2017 CLINICAL DATA:  Found unresponsive EXAM: CT HEAD WITHOUT CONTRAST TECHNIQUE: Contiguous axial images were obtained from the base of the skull through the vertex without intravenous contrast. COMPARISON:  09/24/2015 FINDINGS: Brain: There is atrophy and chronic small vessel disease changes. No acute intracranial abnormality. Specifically, no hemorrhage, hydrocephalus, mass lesion, acute infarction, or significant intracranial injury. Vascular: No hyperdense vessel or unexpected calcification. Skull: No acute calvarial abnormality. Sinuses/Orbits: Visualized paranasal sinuses and mastoids clear. Orbital soft tissues  unremarkable. Other: None IMPRESSION: No acute intracranial abnormality. Atrophy, chronic microvascular disease. Electronically Signed   By: Rolm Baptise M.D.   On: 07/28/2017 09:08   Dg Chest Port 1 View  Result Date: 07/28/2017 CLINICAL DATA:  Altered mental status.  Shortness of breath. EXAM: PORTABLE CHEST 1 VIEW COMPARISON:  Radiograph  of July 16, 2017. FINDINGS: Stable cardiomegaly with mild central pulmonary vascular congestion. No pneumothorax or pleural effusion is noted. Mild bibasilar subsegmental atelectasis or infiltrates is noted. Bony thorax is unremarkable. IMPRESSION: Stable bibasilar subsegmental atelectasis or infiltrates. Stable cardiomegaly with central pulmonary vascular congestion. Electronically Signed   By: Marijo Conception, M.D.   On: 07/28/2017 08:02   Dg Chest Port 1 View  Result Date: 07/16/2017 CLINICAL DATA:  Shortness of breath. Recent pneumonia. History of COPD. EXAM: PORTABLE CHEST 1 VIEW COMPARISON:  Chest radiograph July 04, 2017 FINDINGS: Similar interstitial and alveolar airspace opacities most conspicuous in RIGHT lower lobe. No pleural effusion. Stable cardiomegaly. No pneumothorax. Soft tissue planes and included osseous structures are nonsuspicious. IMPRESSION: Similar interstitial and to lesser extent alveolar airspace opacities seen with pulmonary edema and/or pneumonia. Stable cardiomegaly. Electronically Signed   By: Elon Alas M.D.   On: 07/16/2017 13:47   US Abdomen Limited Ruq  Result Date: 07/28/2017 CLINICAL DATA:  Right upper quadrant pain EXAM: ULTRASOUND ABDOMEN LIMITED RIGHT UPPER QUADRANT COMPARISON:  CT abdomen and pelvis April 23, 2017 FINDINGS: Gallbladder: No gallstones or wall thickening visualized. There is no pericholecystic fluid. No sonographic Murphy sign noted by sonographer. Common bile duct: Diameter: 3 mm. No intrahepatic or extrahepatic biliary duct dilatation. Liver: No focal lesion identified. Within normal limits in parenchymal echogenicity. Portal vein is patent on color Doppler imaging with normal direction of blood flow towards the liver. IMPRESSION: Study within normal limits. Electronically Signed   By: Lowella Grip III M.D.   On: 07/28/2017 11:13        Discharge Exam: Vitals:   08/11/17 0445 08/11/17 0737  BP: (!) 149/85   Pulse: 97   Resp:  (!) 30   Temp: 98.2 F (36.8 C)   SpO2: 98% 96%   Vitals:   08/10/17 1957 08/10/17 2136 08/11/17 0445 08/11/17 0737  BP:  128/75 (!) 149/85   Pulse:  88 97   Resp:  19 (!) 30   Temp:  98.2 F (36.8 C) 98.2 F (36.8 C)   TempSrc:  Oral Oral   SpO2: 93% 94% 98% 96%  Weight:      Height:        General: Pt is alert, awake, not in acute distress Cardiovascular: RRR, S1/S2 +, no rubs, no gallops Respiratory: CTA bilaterally, no wheezing, no rhonchi Abdominal: Soft, NT, ND, bowel sounds + Extremities: no edema, no cyanosis   The results of significant diagnostics from this hospitalization (including imaging, microbiology, ancillary and laboratory) are listed below for reference.    Significant Diagnostic Studies: Dg Chest 2 View  Result Date: 08/06/2017 CLINICAL DATA:  Patient was diagnosed today with pneumonia at an outside facility. Here for re-evaluation. History of COPD and asthma. EXAM: CHEST - 2 VIEW COMPARISON:  07/28/2017 FINDINGS: Cardiac enlargement with mild pulmonary vascular congestion. Diffuse interstitial and alveolar infiltrates may indicate pneumonia or edema. There is more dense infiltration in the left lower lung posteriorly suggesting superimposed pneumonia. No blunting of costophrenic angles. No pneumothorax. Mediastinal contours appear intact. Calcification of the aorta. IMPRESSION: 1. Cardiac enlargement with pulmonary vascular congestion and diffuse interstitial and alveolar  infiltration suggesting edema. This could also represent multifocal pneumonia. 2. Focal area of more dense infiltration in the left lower lung posteriorly suggest superimposed pneumonia. Electronically Signed   By: Lucienne Capers M.D.   On: 08/06/2017 21:06   Ct Head Wo Contrast  Result Date: 07/28/2017 CLINICAL DATA:  Found unresponsive EXAM: CT HEAD WITHOUT CONTRAST TECHNIQUE: Contiguous axial images were obtained from the base of the skull through the vertex without intravenous contrast.  COMPARISON:  09/24/2015 FINDINGS: Brain: There is atrophy and chronic small vessel disease changes. No acute intracranial abnormality. Specifically, no hemorrhage, hydrocephalus, mass lesion, acute infarction, or significant intracranial injury. Vascular: No hyperdense vessel or unexpected calcification. Skull: No acute calvarial abnormality. Sinuses/Orbits: Visualized paranasal sinuses and mastoids clear. Orbital soft tissues unremarkable. Other: None IMPRESSION: No acute intracranial abnormality. Atrophy, chronic microvascular disease. Electronically Signed   By: Rolm Baptise M.D.   On: 07/28/2017 09:08   Dg Chest Port 1 View  Result Date: 07/28/2017 CLINICAL DATA:  Altered mental status.  Shortness of breath. EXAM: PORTABLE CHEST 1 VIEW COMPARISON:  Radiograph of July 16, 2017. FINDINGS: Stable cardiomegaly with mild central pulmonary vascular congestion. No pneumothorax or pleural effusion is noted. Mild bibasilar subsegmental atelectasis or infiltrates is noted. Bony thorax is unremarkable. IMPRESSION: Stable bibasilar subsegmental atelectasis or infiltrates. Stable cardiomegaly with central pulmonary vascular congestion. Electronically Signed   By: Marijo Conception, M.D.   On: 07/28/2017 08:02   Dg Chest Port 1 View  Result Date: 07/16/2017 CLINICAL DATA:  Shortness of breath. Recent pneumonia. History of COPD. EXAM: PORTABLE CHEST 1 VIEW COMPARISON:  Chest radiograph July 04, 2017 FINDINGS: Similar interstitial and alveolar airspace opacities most conspicuous in RIGHT lower lobe. No pleural effusion. Stable cardiomegaly. No pneumothorax. Soft tissue planes and included osseous structures are nonsuspicious. IMPRESSION: Similar interstitial and to lesser extent alveolar airspace opacities seen with pulmonary edema and/or pneumonia. Stable cardiomegaly. Electronically Signed   By: Elon Alas M.D.   On: 07/16/2017 13:47   US Abdomen Limited Ruq  Result Date: 07/28/2017 CLINICAL DATA:  Right  upper quadrant pain EXAM: ULTRASOUND ABDOMEN LIMITED RIGHT UPPER QUADRANT COMPARISON:  CT abdomen and pelvis April 23, 2017 FINDINGS: Gallbladder: No gallstones or wall thickening visualized. There is no pericholecystic fluid. No sonographic Murphy sign noted by sonographer. Common bile duct: Diameter: 3 mm. No intrahepatic or extrahepatic biliary duct dilatation. Liver: No focal lesion identified. Within normal limits in parenchymal echogenicity. Portal vein is patent on color Doppler imaging with normal direction of blood flow towards the liver. IMPRESSION: Study within normal limits. Electronically Signed   By: Lowella Grip III M.D.   On: 07/28/2017 11:13     Microbiology: Recent Results (from the past 240 hour(s))  Blood Culture (routine x 2)     Status: None (Preliminary result)   Collection Time: 08/07/17 12:07 AM  Result Value Ref Range Status   Specimen Description RIGHT ANTECUBITAL  Final   Special Requests   Final    BOTTLES DRAWN AEROBIC AND ANAEROBIC Blood Culture adequate volume   Culture   Final    NO GROWTH 4 DAYS Performed at [facility], [address] [place], [place] [address]    Report Status PENDING  Incomplete  Blood Culture (routine x 2)     Status: None (Preliminary result)   Collection Time: 08/07/17 12:14 AM  Result Value Ref Range Status   Specimen Description LEFT ANTECUBITAL  Final   Special Requests   Final    BOTTLES  DRAWN AEROBIC AND ANAEROBIC Blood Culture adequate volume   Culture   Final    NO GROWTH 4 DAYS Performed at [facility], [address], [place], [place] [address]    Report Status PENDING  Incomplete  Culture, Urine     Status: None   Collection Time: 08/07/17  4:00 PM  Result Value Ref Range Status   Specimen Description   Final    URINE, RANDOM Performed at [facility], [address], [place], [place] [address]    Special Requests   Final    NONE Performed at [facility], [address] [place], [place] [address]     Culture   Final    NO GROWTH Performed at [facility], [place] [address] [place], [place] [address]    Report Status 08/09/2017 FINAL  Final  MRSA PCR Screening     Status: Abnormal   Collection Time: 08/08/17  6:40 AM  Result Value Ref Range Status   MRSA by PCR POSITIVE (A) NEGATIVE Final    Comment:        The GeneXpert MRSA Assay (FDA approved for NASAL specimens only), is one component of a comprehensive MRSA colonization surveillance program. It is not intended to diagnose MRSA infection nor to guide or monitor treatment for MRSA infections. RESULT CALLED TO, READ BACK BY AND VERIFIED WITH: MARTIN D. AT 1106A ON [id number] BY THOMPSON S. Performed at [facility], [address], [place], [place] [address]      Labs: Basic Metabolic Panel: Recent Labs  Lab 08/07/17 [phone] 08/08/17 9449 08/09/17 0718 08/09/17 1608 08/10/17 0707 08/11/17 0601  NA 139 136 139  --  138 137  K 4.0 3.4* 3.4*  --  3.7 3.0*  CL 95* 95* 98*  --  99* 96*  CO2 33* 29 30  --  26 30  GLUCOSE 115* 100* 100*  --  175* 160*  BUN 24* 15 12  --  14 17  CREATININE 1.01 0.92 0.99  --  0.77 0.84  CALCIUM 8.9 8.8* 8.9  --  9.5 9.5  MG 1.9 1.6*  --  2.0 1.8 1.8   Liver Function Tests: Recent Labs  Lab 08/07/17 0629 08/08/17 0643  AST 26 22  ALT 41 32  ALKPHOS 63 53  BILITOT 0.8 0.9  PROT 7.4 6.6  ALBUMIN 2.5* 2.2*   No results for input(s): LIPASE, AMYLASE in the last 168 hours. No results for input(s): AMMONIA in the last 168 hours. CBC: Recent Labs  Lab 08/06/17 2138 08/07/17 0629 08/08/17 0643 08/09/17 0718  WBC 21.7* 16.1* 12.9* 9.8  NEUTROABS 15.9  --   --   --   HGB 10.2* 10.1* 9.3* 9.9*  HCT 35.2* 34.8* 32.3* 34.1*  MCV 82.1 82.5 81.6 81.8  PLT 296 291 302 304   Cardiac Enzymes: Recent Labs  Lab 08/06/17 2138  TROPONINI 0.03*   BNP: Invalid input(s): POCBNP CBG: No results for input(s): GLUCAP in the last 168 hours.  Time coordinating discharge:  36  minutes  Signed:  Orson Eva, DO [facility] Pager: [phone] 08/11/2017, 11:15 AM

## 2017-08-12 LAB — CULTURE, BLOOD (ROUTINE X 2)
CULTURE: NO GROWTH
CULTURE: NO GROWTH
SPECIAL REQUESTS: ADEQUATE
Special Requests: ADEQUATE

## 2017-11-11 ENCOUNTER — Ambulatory Visit: Payer: Medicare Other | Admitting: Urology

## 2017-12-16 ENCOUNTER — Emergency Department (HOSPITAL_COMMUNITY): Payer: Medicare Other

## 2017-12-16 ENCOUNTER — Inpatient Hospital Stay (HOSPITAL_COMMUNITY)
Admission: EM | Admit: 2017-12-16 | Discharge: 2017-12-19 | DRG: 872 | Disposition: A | Discharge status: Skilled Nursing Facility | Payer: Medicare Other | Source: Skilled Nursing Facility | Attending: Internal Medicine | Admitting: Internal Medicine

## 2017-12-16 ENCOUNTER — Encounter (HOSPITAL_COMMUNITY): Payer: Self-pay

## 2017-12-16 ENCOUNTER — Other Ambulatory Visit: Payer: Self-pay

## 2017-12-16 DIAGNOSIS — G8929 Other chronic pain: Secondary | ICD-10-CM | POA: Diagnosis not present

## 2017-12-16 DIAGNOSIS — I959 Hypotension, unspecified: Secondary | ICD-10-CM | POA: Diagnosis not present

## 2017-12-16 DIAGNOSIS — I10 Essential (primary) hypertension: Secondary | ICD-10-CM | POA: Diagnosis not present

## 2017-12-16 DIAGNOSIS — Z888 Allergy status to other drugs, medicaments and biological substances status: Secondary | ICD-10-CM | POA: Diagnosis not present

## 2017-12-16 DIAGNOSIS — K219 Gastro-esophageal reflux disease without esophagitis: Secondary | ICD-10-CM | POA: Diagnosis not present

## 2017-12-16 DIAGNOSIS — N390 Urinary tract infection, site not specified: Secondary | ICD-10-CM | POA: Diagnosis not present

## 2017-12-16 DIAGNOSIS — J189 Pneumonia, unspecified organism: Secondary | ICD-10-CM | POA: Diagnosis not present

## 2017-12-16 DIAGNOSIS — E785 Hyperlipidemia, unspecified: Secondary | ICD-10-CM | POA: Diagnosis not present

## 2017-12-16 DIAGNOSIS — A419 Sepsis, unspecified organism: Secondary | ICD-10-CM | POA: Diagnosis not present

## 2017-12-16 DIAGNOSIS — J42 Unspecified chronic bronchitis: Secondary | ICD-10-CM | POA: Diagnosis not present

## 2017-12-16 DIAGNOSIS — F329 Major depressive disorder, single episode, unspecified: Secondary | ICD-10-CM | POA: Diagnosis not present

## 2017-12-16 DIAGNOSIS — J439 Emphysema, unspecified: Secondary | ICD-10-CM | POA: Diagnosis not present

## 2017-12-16 DIAGNOSIS — J9611 Chronic respiratory failure with hypoxia: Secondary | ICD-10-CM | POA: Diagnosis present

## 2017-12-16 DIAGNOSIS — N4889 Other specified disorders of penis: Secondary | ICD-10-CM

## 2017-12-16 DIAGNOSIS — C679 Malignant neoplasm of bladder, unspecified: Secondary | ICD-10-CM

## 2017-12-16 DIAGNOSIS — Z9049 Acquired absence of other specified parts of digestive tract: Secondary | ICD-10-CM | POA: Diagnosis not present

## 2017-12-16 DIAGNOSIS — R310 Gross hematuria: Secondary | ICD-10-CM | POA: Diagnosis not present

## 2017-12-16 DIAGNOSIS — D649 Anemia, unspecified: Secondary | ICD-10-CM | POA: Diagnosis present

## 2017-12-16 DIAGNOSIS — T83021A Displacement of indwelling urethral catheter, initial encounter: Secondary | ICD-10-CM

## 2017-12-16 DIAGNOSIS — N400 Enlarged prostate without lower urinary tract symptoms: Secondary | ICD-10-CM | POA: Diagnosis not present

## 2017-12-16 DIAGNOSIS — X58XXXA Exposure to other specified factors, initial encounter: Secondary | ICD-10-CM | POA: Diagnosis not present

## 2017-12-16 DIAGNOSIS — A4159 Other Gram-negative sepsis: Secondary | ICD-10-CM | POA: Diagnosis not present

## 2017-12-16 DIAGNOSIS — M549 Dorsalgia, unspecified: Secondary | ICD-10-CM | POA: Diagnosis not present

## 2017-12-16 DIAGNOSIS — F17210 Nicotine dependence, cigarettes, uncomplicated: Secondary | ICD-10-CM | POA: Diagnosis not present

## 2017-12-16 DIAGNOSIS — F419 Anxiety disorder, unspecified: Secondary | ICD-10-CM | POA: Diagnosis present

## 2017-12-16 DIAGNOSIS — Z885 Allergy status to narcotic agent status: Secondary | ICD-10-CM | POA: Diagnosis not present

## 2017-12-16 DIAGNOSIS — Z91041 Radiographic dye allergy status: Secondary | ICD-10-CM | POA: Diagnosis not present

## 2017-12-16 DIAGNOSIS — Z8711 Personal history of peptic ulcer disease: Secondary | ICD-10-CM | POA: Diagnosis not present

## 2017-12-16 DIAGNOSIS — E7800 Pure hypercholesterolemia, unspecified: Secondary | ICD-10-CM | POA: Diagnosis not present

## 2017-12-16 DIAGNOSIS — J449 Chronic obstructive pulmonary disease, unspecified: Secondary | ICD-10-CM | POA: Diagnosis present

## 2017-12-16 DIAGNOSIS — E876 Hypokalemia: Secondary | ICD-10-CM | POA: Diagnosis not present

## 2017-12-16 LAB — CBC WITH DIFFERENTIAL/PLATELET
Basophils Absolute: 0 10*3/uL (ref 0.0–0.1)
Basophils Relative: 0 %
EOS ABS: 0.6 10*3/uL (ref 0.0–0.7)
EOS PCT: 4 %
HCT: 34.4 % — ABNORMAL LOW (ref 39.0–52.0)
Hemoglobin: 10.4 g/dL — ABNORMAL LOW (ref 13.0–17.0)
LYMPHS ABS: 3.7 10*3/uL (ref 0.7–4.0)
LYMPHS PCT: 23 %
MCH: 25.9 pg — AB (ref 26.0–34.0)
MCHC: 30.2 g/dL (ref 30.0–36.0)
MCV: 85.6 fL (ref 78.0–100.0)
Monocytes Absolute: 1.3 10*3/uL — ABNORMAL HIGH (ref 0.1–1.0)
Monocytes Relative: 8 %
Neutro Abs: 10.6 10*3/uL — ABNORMAL HIGH (ref 1.7–7.7)
Neutrophils Relative %: 65 %
PLATELETS: 524 10*3/uL — AB (ref 150–400)
RBC: 4.02 MIL/uL — ABNORMAL LOW (ref 4.22–5.81)
RDW: 19.7 % — ABNORMAL HIGH (ref 11.5–15.5)
WBC: 16.2 10*3/uL — AB (ref 4.0–10.5)

## 2017-12-16 LAB — URINALYSIS, ROUTINE W REFLEX MICROSCOPIC
BILIRUBIN URINE: NEGATIVE
Glucose, UA: NEGATIVE mg/dL
NITRITE: NEGATIVE
PH: 6.5 (ref 5.0–8.0)
Protein, ur: 100 mg/dL — AB
Specific Gravity, Urine: 1.025 (ref 1.005–1.030)

## 2017-12-16 LAB — I-STAT CG4 LACTIC ACID, ED
Lactic Acid, Venous: 2.18 mmol/L (ref 0.5–1.9)
Lactic Acid, Venous: 2 mmol/L (ref 0.5–1.9)

## 2017-12-16 LAB — COMPREHENSIVE METABOLIC PANEL
ALBUMIN: 2.6 g/dL — AB (ref 3.5–5.0)
ALT: 13 U/L (ref 0–44)
ANION GAP: 9 (ref 5–15)
AST: 17 U/L (ref 15–41)
Alkaline Phosphatase: 84 U/L (ref 38–126)
BILIRUBIN TOTAL: 0.5 mg/dL (ref 0.3–1.2)
BUN: 9 mg/dL (ref 8–23)
CO2: 33 mmol/L — AB (ref 22–32)
Calcium: 10.6 mg/dL — ABNORMAL HIGH (ref 8.9–10.3)
Chloride: 94 mmol/L — ABNORMAL LOW (ref 98–111)
Creatinine, Ser: 0.7 mg/dL (ref 0.61–1.24)
GFR calc Af Amer: >60 mL/min (ref >60)
GFR calc non Af Amer: >60 mL/min (ref >60)
GLUCOSE: 130 mg/dL — AB (ref 70–99)
Potassium: 3.9 mmol/L (ref 3.5–5.1)
Sodium: 136 mmol/L (ref 135–145)
TOTAL PROTEIN: 7.7 g/dL (ref 6.5–8.1)

## 2017-12-16 LAB — PROTIME-INR
INR: 0.97
PROTHROMBIN TIME: 12.8 s (ref 11.4–15.2)

## 2017-12-16 LAB — URINALYSIS, MICROSCOPIC (REFLEX): RBC / HPF: >50 RBC/hpf (ref 0–5)

## 2017-12-16 LAB — PROCALCITONIN: Procalcitonin: <0.1 ng/mL

## 2017-12-16 MED ORDER — MAGNESIUM OXIDE 400 (241.3 MG) MG PO TABS
400.0000 mg | ORAL_TABLET | Freq: Every day | ORAL | Status: DC
  Administered 2017-12-17 – 2017-12-19 (×3): 400 mg via ORAL
  Filled 2017-12-16 (×3): qty 1

## 2017-12-16 MED ORDER — CITALOPRAM HYDROBROMIDE 20 MG PO TABS
20.0000 mg | ORAL_TABLET | Freq: Every day | ORAL | Status: DC
  Administered 2017-12-17 – 2017-12-19 (×3): 20 mg via ORAL
  Filled 2017-12-16 (×3): qty 1

## 2017-12-16 MED ORDER — ALBUTEROL SULFATE (2.5 MG/3ML) 0.083% IN NEBU
2.5000 mg | INHALATION_SOLUTION | Freq: Two times a day (BID) | RESPIRATORY_TRACT | Status: DC
  Administered 2017-12-17 – 2017-12-19 (×5): 2.5 mg via RESPIRATORY_TRACT
  Filled 2017-12-16 (×6): qty 3

## 2017-12-16 MED ORDER — ROFLUMILAST 500 MCG PO TABS
500.0000 ug | ORAL_TABLET | Freq: Every day | ORAL | Status: DC
  Administered 2017-12-17 – 2017-12-19 (×3): 500 ug via ORAL
  Filled 2017-12-16 (×3): qty 1

## 2017-12-16 MED ORDER — ALBUTEROL SULFATE (2.5 MG/3ML) 0.083% IN NEBU
2.5000 mg | INHALATION_SOLUTION | Freq: Four times a day (QID) | RESPIRATORY_TRACT | Status: DC
  Administered 2017-12-16: 2.5 mg via RESPIRATORY_TRACT
  Filled 2017-12-16: qty 3

## 2017-12-16 MED ORDER — FLEET ENEMA 7-19 GM/118ML RE ENEM: 1.0000 | ENEMA | Freq: Once | RECTAL | Status: DC | PRN

## 2017-12-16 MED ORDER — ALUM & MAG HYDROXIDE-SIMETH 200-200-20 MG/5ML PO SUSP
30.0000 mL | Freq: Four times a day (QID) | ORAL | Status: DC | PRN
  Administered 2017-12-16: 30 mL via ORAL
  Filled 2017-12-16: qty 30

## 2017-12-16 MED ORDER — SENNOSIDES-DOCUSATE SODIUM 8.6-50 MG PO TABS
1.0000 | ORAL_TABLET | Freq: Two times a day (BID) | ORAL | Status: DC
  Administered 2017-12-16 – 2017-12-19 (×7): 1 via ORAL
  Filled 2017-12-16 (×7): qty 1

## 2017-12-16 MED ORDER — SODIUM CHLORIDE 0.9 % IV BOLUS
1000.0000 mL | Freq: Once | INTRAVENOUS | Status: AC
  Administered 2017-12-16: 1000 mL via INTRAVENOUS

## 2017-12-16 MED ORDER — VANCOMYCIN HCL IN DEXTROSE 750-5 MG/150ML-% IV SOLN
750.0000 mg | Freq: Once | INTRAVENOUS | Status: DC
  Filled 2017-12-16: qty 150

## 2017-12-16 MED ORDER — VANCOMYCIN HCL IN DEXTROSE 1-5 GM/200ML-% IV SOLN: 1000.0000 mg | INTRAVENOUS | Status: DC

## 2017-12-16 MED ORDER — ACETAMINOPHEN 325 MG PO TABS
650.0000 mg | ORAL_TABLET | Freq: Four times a day (QID) | ORAL | Status: DC | PRN
  Administered 2017-12-17 – 2017-12-19 (×4): 650 mg via ORAL
  Filled 2017-12-16 (×4): qty 2

## 2017-12-16 MED ORDER — PANTOPRAZOLE SODIUM 40 MG PO TBEC
40.0000 mg | DELAYED_RELEASE_TABLET | Freq: Every day | ORAL | Status: DC
  Administered 2017-12-17 – 2017-12-19 (×3): 40 mg via ORAL
  Filled 2017-12-16 (×3): qty 1

## 2017-12-16 MED ORDER — HYDROMORPHONE HCL 1 MG/ML IJ SOLN
0.5000 mg | INTRAMUSCULAR | Status: DC | PRN
  Administered 2017-12-16: 0.5 mg via INTRAVENOUS
  Filled 2017-12-16: qty 1

## 2017-12-16 MED ORDER — OXYBUTYNIN CHLORIDE 5 MG PO TABS
5.0000 mg | ORAL_TABLET | Freq: Three times a day (TID) | ORAL | Status: DC
  Administered 2017-12-16 – 2017-12-19 (×10): 5 mg via ORAL
  Filled 2017-12-16 (×9): qty 1

## 2017-12-16 MED ORDER — VANCOMYCIN HCL 500 MG IV SOLR
500.0000 mg | Freq: Once | INTRAVENOUS | Status: AC
  Administered 2017-12-16: 500 mg via INTRAVENOUS
  Filled 2017-12-16: qty 500

## 2017-12-16 MED ORDER — ALPRAZOLAM 1 MG PO TABS
1.0000 mg | ORAL_TABLET | Freq: Three times a day (TID) | ORAL | Status: DC | PRN
  Administered 2017-12-18 – 2017-12-19 (×3): 1 mg via ORAL
  Filled 2017-12-16 (×3): qty 1

## 2017-12-16 MED ORDER — ALBUTEROL SULFATE (2.5 MG/3ML) 0.083% IN NEBU: 2.5000 mg | INHALATION_SOLUTION | RESPIRATORY_TRACT | Status: DC | PRN

## 2017-12-16 MED ORDER — DULOXETINE HCL 60 MG PO CPEP
60.0000 mg | ORAL_CAPSULE | Freq: Every day | ORAL | Status: DC
  Administered 2017-12-17 – 2017-12-19 (×3): 60 mg via ORAL
  Filled 2017-12-16 (×3): qty 1

## 2017-12-16 MED ORDER — ONDANSETRON HCL 4 MG/2ML IJ SOLN: 4.0000 mg | Freq: Four times a day (QID) | INTRAMUSCULAR | Status: DC | PRN

## 2017-12-16 MED ORDER — SODIUM CHLORIDE 0.9 % IV SOLN
1.0000 g | Freq: Once | INTRAVENOUS | Status: AC
  Administered 2017-12-16: 1 g via INTRAVENOUS
  Filled 2017-12-16: qty 1

## 2017-12-16 MED ORDER — OXYCODONE HCL 5 MG PO TABS
5.0000 mg | ORAL_TABLET | Freq: Four times a day (QID) | ORAL | Status: DC | PRN
  Administered 2017-12-19: 5 mg via ORAL
  Filled 2017-12-16: qty 1

## 2017-12-16 MED ORDER — SODIUM CHLORIDE 0.9 % IV SOLN
1.0000 g | Freq: Once | INTRAVENOUS | Status: AC
  Administered 2017-12-16: 1 g via INTRAVENOUS
  Filled 2017-12-16: qty 10

## 2017-12-16 MED ORDER — MONTELUKAST SODIUM 10 MG PO TABS
10.0000 mg | ORAL_TABLET | Freq: Every day | ORAL | Status: DC
  Administered 2017-12-16 – 2017-12-19 (×4): 10 mg via ORAL
  Filled 2017-12-16 (×4): qty 1

## 2017-12-16 MED ORDER — FUROSEMIDE 20 MG PO TABS
20.0000 mg | ORAL_TABLET | Freq: Every day | ORAL | Status: DC
  Filled 2017-12-16: qty 1

## 2017-12-16 MED ORDER — SODIUM CHLORIDE 0.9 % IV SOLN
1.0000 g | Freq: Three times a day (TID) | INTRAVENOUS | Status: DC
  Administered 2017-12-17 – 2017-12-18 (×5): 1 g via INTRAVENOUS
  Filled 2017-12-16 (×5): qty 1

## 2017-12-16 MED ORDER — VANCOMYCIN HCL IN DEXTROSE 1-5 GM/200ML-% IV SOLN: 1000.0000 mg | Freq: Once | INTRAVENOUS | Status: DC

## 2017-12-16 MED ORDER — SODIUM CHLORIDE 0.9 % IV SOLN: 2.0000 g | Freq: Once | INTRAVENOUS | Status: DC

## 2017-12-16 MED ORDER — ONDANSETRON HCL 4 MG PO TABS: 4.0000 mg | ORAL_TABLET | Freq: Four times a day (QID) | ORAL | Status: DC | PRN

## 2017-12-16 MED ORDER — SODIUM CHLORIDE 0.9 % IV BOLUS
500.0000 mL | Freq: Once | INTRAVENOUS | Status: AC
  Administered 2017-12-16: 500 mL via INTRAVENOUS

## 2017-12-16 NOTE — ED Notes (Signed)
[name] [name] pts daughter and POA phone # [phone], [phone]

## 2017-12-16 NOTE — ED Provider Notes (Signed)
[facility] EMERGENCY DEPARTMENT Provider Note   CSN: [id number] Arrival date & time: 12/16/17  1137     History   Chief Complaint Chief Complaint  Patient presents with  . foley problem    HPI [name] [name] is a 77 y.o. male.  Patient brought in by EMS from [place] facility for [employer].  Patient has history of bladder cancer and recurrent Foley use and it was removed at the facility per report this morning and they were unable to replace the Foley due to bleeding.  Patient unsure details of his medical history or symptoms.  Patient says he overall feels well is on home oxygen for COPD.  Patient still smoking cigarettes.  Patient denies fevers or chills.  Patient unsure how long bleeding and swelling to the penis has been present.     Past Medical History:  Diagnosis Date  . Anxiety 03/13/2012  . Asthma   . 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  Marland Kitchen BPH (benign prostatic hyperplasia) 10/01/2011   Per cystoscopy  . Cancer ([place])    bladder  . Chronic anxiety   . Chronic back pain   . COPD (chronic obstructive pulmonary disease) (HCC)    a. on 4-5L [place] at baseline  . Depression   . Emphysema lung ([place])   . GERD (gastroesophageal reflux disease)   . Headache(784.0)   . History of stomach ulcers   . Hypercholesteremia   . Hyperlipidemia 03/13/2012  . Pulmonary nodule 09/2011   Stable appearance  . Tobacco abuse 09/29/2011    Patient Active Problem List   Diagnosis Date Noted  . Penile bleeding 12/16/2017  . Hypomagnesemia 08/08/2017  . Pneumonia 08/07/2017  . Lobar pneumonia ([place]) 08/07/2017  . Hypoxemia   . Transaminitis   . Encounter for hospice care discussion   . Encephalopathy acute 07/28/2017  . Palliative care by specialist   . Goals of care, counseling/discussion   . DNR (do not resuscitate) discussion   . Pulmonary edema, acute ([place]) 07/16/2017  . Acute blood loss anemia 07/16/2017  . Sepsis ([place]) 07/04/2017  .  Hypokalemia 07/04/2017  . Leukocytosis 07/04/2017  . Urothelial carcinoma ([place]) 06/25/2017  . AKI (acute kidney injury) ([place])   . Severe anemia 04/22/2017  . Shortness of breath   . Acute on chronic respiratory failure ([place]) 10/24/2015  . Pressure ulcer 09/25/2015  . Respiratory failure ([place]) 09/24/2015  . Acute on chronic respiratory failure with hypercapnia ([place])   . Frequent falls 09/21/2015  . HLD (hyperlipidemia) 08/18/2015  . Chronic anemia 08/18/2015  . COPD exacerbation ([place]) 08/18/2015  . Chronic pain syndrome 05/01/2015  . Chronic respiratory failure with hypoxia ([place])   . Acute on chronic respiratory failure with hypoxia ([place]) 02/25/2014  . COPD (chronic obstructive pulmonary disease) ([place]) 02/09/2014  . Hyperglycemia, drug-induced 03/26/2013  . Chronic back pain   . Anxiety 03/13/2012  . Hyperlipidemia 03/13/2012  . BPH (benign prostatic hyperplasia) 10/01/2011  . Tobacco abuse 09/29/2011  . Bladder cancer ([place]) 09/28/2011  . PULMONARY NODULE 12/27/2009  . Essential hypertension 12/26/2009  . OSTEOARTHRITIS 12/26/2009    Past Surgical History:  Procedure Laterality Date  . CHOLECYSTECTOMY    . CYSTOSCOPY  09/30/2011   Procedure: CYSTOSCOPY FLEXIBLE;  Surgeon: Marissa Nestle, MD;  Location: [facility];  Service: Urology;  Laterality: N/A;  . FRACTURE SURGERY     R ring finger, pin placed  . TRANSURETHRAL RESECTION OF BLADDER TUMOR  10/01/2011   Procedure:  TRANSURETHRAL RESECTION OF BLADDER TUMOR (TURBT);  Surgeon: Marissa Nestle, MD;  Location: [facility];  Service: Urology;  Laterality: N/A;        Home Medications    Prior to Admission medications   Medication Sig Start Date End Date Taking? Authorizing Provider  acetaminophen (TYLENOL) 325 MG tablet Take 650 mg by mouth every 6 (six) hours as needed for headache.   Yes [provider]  albuterol (PROVENTIL) (2.5 MG/3ML) 0.083% nebulizer solution Take 3 mLs (2.5 mg total) by nebulization every 4  (four) hours as needed. For shortness of breath Patient taking differently: Take 2.5 mg by nebulization every 6 (six) hours. For shortness of breath 11/28/15  Yes Timmothy Euler, MD  ALPRAZolam Duanne Moron) 1 MG tablet Take 1 tablet (1 mg total) by mouth 3 (three) times daily as needed for anxiety. 08/11/17  Yes Tat, Shanon Brow, MD  citalopram (CELEXA) 20 MG tablet Take 20 mg by mouth daily.   Yes [provider]  DULoxetine (CYMBALTA) 60 MG capsule Take 1 capsule (60 mg total) by mouth daily. 04/24/17  Yes Shahmehdi, Seyed A, MD  furosemide (LASIX) 20 MG tablet Take 20 mg by mouth daily.   Yes [provider]  magnesium oxide (MAG-OX) 400 (241.3 Mg) MG tablet Take 1 tablet (400 mg total) by mouth daily. 08/12/17  Yes Tat, Shanon Brow, MD  montelukast (SINGULAIR) 10 MG tablet Take 10 mg by mouth at bedtime.   Yes [provider]  omeprazole (PRILOSEC) 20 MG capsule Take 20 mg by mouth daily. 11/11/16  Yes [provider]  oxybutynin (DITROPAN) 5 MG tablet Take 5 mg by mouth 3 (three) times daily.   Yes [provider]  oxyCODONE (OXY IR/ROXICODONE) 5 MG immediate release tablet Take 1 tablet (5 mg total) by mouth every 6 (six) hours as needed for severe pain. 08/11/17  Yes Tat, Shanon Brow, MD  OXYGEN Inhale 6 L into the lungs continuous.   Yes [provider]  roflumilast (DALIRESP) 500 MCG TABS tablet Take 1 Tablet by mouth once daily 11/11/16  Yes [provider]  senna-docusate (SENOKOT-S) 8.6-50 MG tablet Take 1 tablet by mouth 2 (two) times daily.   Yes [provider]  amoxicillin-clavulanate (AUGMENTIN) 875-125 MG tablet Take 1 tablet by mouth 2 (two) times daily. Patient not taking: Reported on 12/16/2017 08/11/17   Orson Eva, MD  predniSONE (DELTASONE) 10 MG tablet Take 6 tablets (60 mg total) by mouth daily with breakfast. And decrease by one tablet daily Patient not taking: Reported on 12/16/2017 08/12/17   Orson Eva, MD    Family  History Family History  Problem Relation Age of Onset  . Kidney failure Mother   . Emphysema Brother        was not close to family; does not truly know their medical problems.  . Emphysema Brother   . Emphysema Brother   . Cancer Neg Hx     Social History Social History   Tobacco Use  . Smoking status: Current Every Day Smoker    Packs/day: 0.50    Years: 58.00    Pack years: 29.00    Types: Cigarettes  . Smokeless tobacco: Never Used  Substance Use Topics  . Alcohol use: No  . Drug use: No     Allergies   Metrizamide; Codeine; Iohexol; Ivp dye [iodinated diagnostic agents]; and Other   Review of Systems Review of Systems  Constitutional: Negative for chills and fever.  HENT: Negative for congestion.  Eyes: Negative for visual disturbance.  Respiratory: Negative for shortness of breath.   Cardiovascular: Negative for chest pain.  Gastrointestinal: Negative for abdominal pain and vomiting.  Genitourinary: Positive for difficulty urinating and penile swelling. Negative for dysuria and flank pain.  Musculoskeletal: Negative for back pain, neck pain and neck stiffness.  Skin: Negative for rash.  Neurological: Negative for light-headedness and headaches.     Physical Exam Updated Vital Signs BP 112/84   Pulse (!) 107   Temp 97.8 F (36.6 C) (Oral)   Resp (!) 27   Ht 5\' 5"  (1.651 m)   Wt 49.9 kg   SpO2 99%   BMI 18.30 kg/m   Physical Exam  Constitutional: He is oriented to person, place, and time. He appears well-developed.  HENT:  Head: Normocephalic and atraumatic.  Dry mm  Eyes: Conjunctivae are normal. Right eye exhibits no discharge. Left eye exhibits no discharge.  Neck: Normal range of motion. Neck supple. No tracheal deviation present.  Cardiovascular: Regular rhythm. Tachycardia present.  Pulmonary/Chest: He has rales (tachypnea).  Abdominal: Soft. He exhibits no distension. There is no tenderness. There is no guarding.  Genitourinary:   Genitourinary Comments: Swollen penis, inflamed glans, mild superficial abrasion to glans/ mild bleeding controlled with pressure, widened urethra  Musculoskeletal: He exhibits no edema.  Neurological: He is alert and oriented to person, place, and time.  Skin: Skin is warm. No rash noted.  Psychiatric: His mood appears not anxious. He is not agitated and not slowed.  Nursing note and vitals reviewed.    ED Treatments / Results  Labs (all labs ordered are listed, but only abnormal results are displayed) Labs Reviewed  COMPREHENSIVE METABOLIC PANEL - Abnormal; Notable for the following components:      Result Value   Chloride 94 (*)    CO2 33 (*)    Glucose, Bld 130 (*)    Calcium 10.6 (*)    Albumin 2.6 (*)    All other components within normal limits  CBC WITH DIFFERENTIAL/PLATELET - Abnormal; Notable for the following components:   WBC 16.2 (*)    RBC 4.02 (*)    Hemoglobin 10.4 (*)    HCT 34.4 (*)    MCH 25.9 (*)    RDW 19.7 (*)    Platelets 524 (*)    Neutro Abs 10.6 (*)    Monocytes Absolute 1.3 (*)    All other components within normal limits  I-STAT CG4 LACTIC ACID, ED - Abnormal; Notable for the following components:   Lactic Acid, Venous 2.18 (*)    All other components within normal limits  I-STAT CG4 LACTIC ACID, ED - Abnormal; Notable for the following components:   Lactic Acid, Venous 2.00 (*)    All other components within normal limits  CULTURE, BLOOD (ROUTINE X 2)  CULTURE, BLOOD (ROUTINE X 2)  URINE CULTURE  PROTIME-INR  URINALYSIS, ROUTINE W REFLEX MICROSCOPIC  PROCALCITONIN    EKG EKG Interpretation  Date/Time:  Tuesday December 16 2017 11:54:12 EDT Ventricular Rate:  109 PR Interval:    QRS Duration: 86 QT Interval:  323 QTC Calculation: 435 R Axis:   102 Text Interpretation:  Sinus tachycardia Right axis deviation Low voltage, extremity and precordial leads Minimal ST elevation, inferior leads poor baseline Confirmed by Elnora Morrison  [phone]) on 12/16/2017 3:10:13 PM   Radiology Dg Chest 2 View  Result Date: 12/16/2017 CLINICAL DATA:  Suspected sepsis.  Cough and congestion. EXAM: CHEST - 2 VIEW COMPARISON:  October 16, 2017 FINDINGS: No pneumothorax. Scarring and nodularity in the right upper lung is stable since the April 23, 2017 CT. Opacity seen in the right mid lung, in the region of a right-sided EKG lead. Emphysematous changes in the lungs. The heart, hila, and mediastinum are unremarkable. No other acute abnormalities identified. IMPRESSION: 1. There is mild focal opacity in the right mid lung in the region of an EKG lead. A subtle focal infiltrate is possible. Recommend short-term follow-up to ensure resolution. 2. Scarring and nodularity in the right upper lobe is stable as above. 3. Emphysematous changes in the lungs. Electronically Signed   By: Dorise Bullion III M.D   On: 12/16/2017 13:05    Procedures .Critical Care Performed by: Elnora Morrison, MD Authorized by: Elnora Morrison, MD   Critical care provider statement:    Critical care time (minutes):  40   Critical care start time:  12/16/2017 3:00 PM   Critical care end time:  12/16/2017 3:40 PM   Critical care time was exclusive of:  Separately billable procedures and treating other patients and teaching time   Critical care was necessary to treat or prevent imminent or life-threatening deterioration of the following conditions:  Sepsis   Critical care was time spent personally by me on the following activities:  Discussions with consultants, evaluation of patient's response to treatment, examination of patient, ordering and performing treatments and interventions, ordering and review of laboratory studies, ordering and review of radiographic studies, pulse oximetry, re-evaluation of patient's condition, obtaining history from patient or surrogate and review of old charts   (including critical care time)  Medications Ordered in ED Medications  sodium chloride 0.9 %  bolus 1,000 mL (1,000 mLs Intravenous New Bag/Given 12/16/17 1522)  sodium chloride 0.9 % bolus 500 mL (has no administration in time range)  vancomycin (VANCOCIN) 749 mg in sodium chloride 0.9 % 150 mL IVPB (has no administration in time range)  cefTRIAXone (ROCEPHIN) 1 g in sodium chloride 0.9 % 100 mL IVPB (1 g Intravenous New Bag/Given 12/16/17 1527)     Initial Impression / Assessment and Plan / ED Course  I have reviewed the triage vital signs and the nursing notes.  Pertinent labs & imaging results that were available during my care of the patient were reviewed by me and considered in my medical decision making (see chart for details).    Patient presents for hematuria and unable to replace Foley.  On exam patient is tachycardic, tachypneic, sepsis screen revealed leukocytosis.  Urinalysis concerning for infection and possible pneumonia on chest x-ray reviewed.  Blood work reviewed.  Discussed with urology who reviewed his records and he had palliative care recommended due to extensive bladder cancer.  At bedside nursing staff myself unable to pass Foley all the way likely due to bladder cancer.  Patient will need to be transferred to [facility] long hospital per urology and hospitalist was consulted to assist with this.  IV antibiotics ordered, cultures, IV fluids.  The patients results and plan were reviewed and discussed.   Any x-rays performed were independently reviewed by myself.   Differential diagnosis were considered with the presenting HPI.  Medications  sodium chloride 0.9 % bolus 1,000 mL (1,000 mLs Intravenous New Bag/Given 12/16/17 1522)  sodium chloride 0.9 % bolus 500 mL (has no administration in time range)  vancomycin (VANCOCIN) 749 mg in sodium chloride 0.9 % 150 mL IVPB (has no administration in time range)  cefTRIAXone (ROCEPHIN) 1 g in sodium chloride 0.9 % 100  mL IVPB (1 g Intravenous New Bag/Given 12/16/17 1527)    Vitals:   12/16/17 1400 12/16/17 1430 12/16/17 1500  12/16/17 1530  BP: 107/87 103/75 97/72 112/84  Pulse:  (!) 107 (!) 107   Resp: (!) 25 (!) 23 (!) 24 (!) 27  Temp:      TempSrc:      SpO2:  97% 99%   Weight:      Height:        Final diagnoses:  HCAP (healthcare-associated pneumonia)  Dislodged Foley catheter, initial encounter [facility])    Admission/ observation were discussed with the admitting physician, patient and/or family and they are comfortable with the plan.    Final Clinical Impressions(s) / ED Diagnoses   Final diagnoses:  HCAP (healthcare-associated pneumonia)  Dislodged Foley catheter, initial encounter [facility])    ED Discharge Orders    None       Elnora Morrison, MD 12/16/17 1559

## 2017-12-16 NOTE — Progress Notes (Signed)
Patient arrived via care link in stable condition VS documented and flow manager paged to make MD aware of patients arrival. Urology consult has already been done.

## 2017-12-16 NOTE — Progress Notes (Signed)
Pharmacy Antibiotic Note  [name] [name] is a 77 y.o. male admitted on 12/16/2017 with sepsis & pneumonia.  Pharmacy has been consulted for Vancomycin and Cefepime dosing.  Upon transfer to [name], RN confirmed that initial dose of Vancomycin 750mg  was administered (although not charted as given).  Plan:  Will give an additional one time dose of Vancomycin 500mg  IV (following the initial 750mg  dose) for a total loading dose of 1250mg  (20 mg/kg)  Then continue with Vancomycin 1000mg  IV q24h (Goal AUC 400-500)  Cefepime 1gm IV q8h  Follow renal function  F/u culture results/sensitivities  Height: 5\' 5"  (165.1 cm) Weight: 131 lb 2.8 oz (59.5 kg) IBW/kg (Calculated) : 61.5  Temp (24hrs), Avg:98.5 F (36.9 C), Min:97.8 F (36.6 C), Max:99.1 F (37.3 C)  Recent Labs  Lab 12/16/17 1146 12/16/17 1209 12/16/17 1434  WBC 16.2*  --   --   CREATININE 0.70  --   --   LATICACIDVEN  --  2.18* 2.00*    Estimated Creatinine Clearance: 66.1 mL/min (by C-G formula based on SCr of 0.7 mg/dL).    Allergies  Allergen Reactions  . Metrizamide Hives  . Codeine   . Iohexol Hives     Code: HIVES, Desc: PT STATES HE BROKE OUT IN HIVES AND RASH AFTER CT NECK EARLY SEPT 2011; NO RESP PROBLEMS; NEEDS PRE-MEDS; MKS, Onset Date: [id number]   . Ivp Dye [Iodinated Diagnostic Agents] Hives  . Other     Broccoli, Cauliflower     Antimicrobials this admission: 9/3 Ceftriaxone x 1 dose 9/3 Cefepime >>   9/3 Vanc >>  Dose adjustments this admission:    Microbiology results: 9/3 BCx: sent 9/3 UCx: sent   Thank you for allowing pharmacy to be a part of this patient's care.  Everette Rank, PharmD 12/16/2017 7:40 PM

## 2017-12-16 NOTE — ED Notes (Addendum)
[place] and spoke with nurse Glenvil. States he messes with foley cath a lot and pulls at it, bleeding started from penis this morning, the head of the penis has been irritated for "quite some time now" but unsure exactly how long.

## 2017-12-16 NOTE — H&P (Signed)
H&P        History and Physical    [name] [name] WEX:[id number] DOB: 1940/07/18 DOA: 12/16/2017  PCP: [name], MD  Patient coming from: NH  I have personally briefly reviewed patient's old medical records in [place]  Chief Complaint: Penile bleeding  HPI: [name] [name] is a 77 y.o. male with medical history significant of hypertension COPD, chronic respiratory failure on nasal cannula, bladder cancer, anxiety depression who presents from the nursing home with penile bleeding.  Per report the nursing home was unable to place a Foley.  Patient was also unable to get a Foley placed here in the ED.  UA was sent and showed red blood cells and also suggestive of urinary tract infection.  Patient's last 2 urine cultures have been negative with no growth.  he was tachycardic and had soft blood pressures upon  presentation to the ED.  He also has significant leukocytosis of 16,000.  Patient was empirically given vancomycin and Rocephin in the ED.  The 1 L of IV fluids now completed his sepsis bolus with additional 500 transfused.  He has also been having increased rhonchi.  He states his baseline shortness of breath is unchanged.  Chest x-ray showed questionable right sided infiltrate.  Patient complains mainly of just heartburn and  penile pain.  Denies any fever but does have a cough.    ED Course: Unable to place Foley.  Neurology Dr. Tammi Klippel consulted patient to be transferred to [facility] for further care.  Patient given IV vancomycin and Rocephin and 1 L IV fluid in the ED.  Review of Systems: Positive for chronic shortness of breath positive for cough denies fever positive for penile swelling and pain positive for heartburn All others reviewed with patient  and are  negative unless otherwise stated    Past Medical History:  Diagnosis Date  . Anxiety 03/13/2012  . Asthma   . 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  Marland Kitchen BPH (benign prostatic hyperplasia)  10/01/2011   Per cystoscopy  . Cancer ([place])    bladder  . Chronic anxiety   . Chronic back pain   . COPD (chronic obstructive pulmonary disease) (HCC)    a. on 4-5L [place] at baseline  . Depression   . Emphysema lung ([place])   . GERD (gastroesophageal reflux disease)   . Headache(784.0)   . History of stomach ulcers   . Hypercholesteremia   . Hyperlipidemia 03/13/2012  . Pulmonary nodule 09/2011   Stable appearance  . Tobacco abuse 09/29/2011    Past Surgical History:  Procedure Laterality Date  . CHOLECYSTECTOMY    . CYSTOSCOPY  09/30/2011   Procedure: CYSTOSCOPY FLEXIBLE;  Surgeon: Marissa Nestle, MD;  Location: [facility];  Service: Urology;  Laterality: N/A;  . FRACTURE SURGERY     R ring finger, pin placed  . TRANSURETHRAL RESECTION OF BLADDER TUMOR  10/01/2011   Procedure: TRANSURETHRAL RESECTION OF BLADDER TUMOR (TURBT);  Surgeon: Marissa Nestle, MD;  Location: [facility];  Service: Urology;  Laterality: N/A;     reports that he has been smoking cigarettes. He has a 29.00 pack-year smoking history. He has never used smokeless tobacco. He reports that he does not drink alcohol or use drugs.  Allergies  Allergen Reactions  . Metrizamide Hives  . Codeine   . Iohexol Hives     Code: HIVES, Desc: PT STATES HE BROKE OUT IN HIVES AND RASH AFTER CT NECK EARLY SEPT  2011; NO RESP PROBLEMS; NEEDS PRE-MEDS; MKS, Onset Date: [id number]   . Ivp Dye [Iodinated Diagnostic Agents] Hives  . Other     Broccoli, Cauliflower     Family History  Problem Relation Age of Onset  . Kidney failure Mother   . Emphysema Brother        was not close to family; does not truly know their medical problems.  . Emphysema Brother   . Emphysema Brother   . Cancer Neg Hx     Prior to Admission medications   Medication Sig Start Date End Date Taking? Authorizing Provider  acetaminophen (TYLENOL) 325 MG tablet Take 650 mg by mouth every 6 (six) hours as needed for headache.   Yes [provider]    albuterol (PROVENTIL) (2.5 MG/3ML) 0.083% nebulizer solution Take 3 mLs (2.5 mg total) by nebulization every 4 (four) hours as needed. For shortness of breath Patient taking differently: Take 2.5 mg by nebulization every 6 (six) hours. For shortness of breath 11/28/15  Yes Timmothy Euler, MD  ALPRAZolam Duanne Moron) 1 MG tablet Take 1 tablet (1 mg total) by mouth 3 (three) times daily as needed for anxiety. 08/11/17  Yes Tat, Shanon Brow, MD  citalopram (CELEXA) 20 MG tablet Take 20 mg by mouth daily.   Yes [provider]  DULoxetine (CYMBALTA) 60 MG capsule Take 1 capsule (60 mg total) by mouth daily. 04/24/17  Yes Shahmehdi, Seyed A, MD  furosemide (LASIX) 20 MG tablet Take 20 mg by mouth daily.   Yes [provider]  magnesium oxide (MAG-OX) 400 (241.3 Mg) MG tablet Take 1 tablet (400 mg total) by mouth daily. 08/12/17  Yes Tat, Shanon Brow, MD  montelukast (SINGULAIR) 10 MG tablet Take 10 mg by mouth at bedtime.   Yes [provider]  omeprazole (PRILOSEC) 20 MG capsule Take 20 mg by mouth daily. 11/11/16  Yes [provider]  oxybutynin (DITROPAN) 5 MG tablet Take 5 mg by mouth 3 (three) times daily.   Yes [provider]  oxyCODONE (OXY IR/ROXICODONE) 5 MG immediate release tablet Take 1 tablet (5 mg total) by mouth every 6 (six) hours as needed for severe pain. 08/11/17  Yes Tat, Shanon Brow, MD  OXYGEN Inhale 6 L into the lungs continuous.   Yes [provider]  roflumilast (DALIRESP) 500 MCG TABS tablet Take 1 Tablet by mouth once daily 11/11/16  Yes [provider]  senna-docusate (SENOKOT-S) 8.6-50 MG tablet Take 1 tablet by mouth 2 (two) times daily.   Yes [provider]  amoxicillin-clavulanate (AUGMENTIN) 875-125 MG tablet Take 1 tablet by mouth 2 (two) times daily. Patient not taking: Reported on 12/16/2017 08/11/17   Orson Eva, MD  predniSONE (DELTASONE) 10 MG tablet Take 6 tablets (60 mg total) by mouth daily with breakfast. And  decrease by one tablet daily Patient not taking: Reported on 12/16/2017 08/12/17   Orson Eva, MD    Physical Exam: Vitals:   12/16/17 1430 12/16/17 1500 12/16/17 1530 12/16/17 1630  BP: 103/75 97/72 112/84 97/78  Pulse: (!) 107 (!) 107  (!) 134  Resp: (!) 23 (!) 24 (!) 27 (!) 29  Temp:      TempSrc:      SpO2: 97% 99%  91%  Weight:      Height:        Constitutional: NAD, mildly anxious  Vitals:   12/16/17 1430 12/16/17 1500 12/16/17 1530 12/16/17 1630  BP: 103/75 97/72 112/84 97/78  Pulse: (!) 107 (!) 107  Marland Kitchen)  134  Resp: (!) 23 (!) 24 (!) 27 (!) 29  Temp:      TempSrc:      SpO2: 97% 99%  91%  Weight:      Height:       Eyes: PERRL, lids and conjunctivae normal ENMT: Mucous membranes are moist. Posterior pharynx clear of any exudate or lesions.Normal dentition.  Neck: normal, supple, no masses,  Respiratory: Positive for upper  airway rhonchi tight wheezes .  Pursed lip breathing. No accessory muscle use.  Cardiovascular:  tachycardic rate and regular rhythm, no murmurs / rubs / gallops. No extremity edema. 2+ pedal pulses.  Abdomen: no tenderness, no masses palpated. No hepatosplenomegaly. Bowel sounds positive. GU.  Please see attached photo tender pubis area no lymphadenopathy Musculoskeletal: no clubbing / cyanosis. No joint deformity upper and lower extremities. , no contractures. Normal muscle tone.  Skin: no rashes, lesions, ulcers. No induration Neurologic: CN 2-12 grossly intact.  All extremities equally.  Psychiatric: poor  judgment and insight. Alert and oriented x 3.  Anxious  Media Information   Document Information   Photos    12/16/2017 16:30  Attached To:  Hospital Encounter on 12/16/17  Source Information   Johnson-Pitts, Meriam Chojnowski, MD  [facility]    ( Labs on Admission: I have personally reviewed following labs and imaging studies  CBC: Recent Labs  Lab 12/16/17 1146  WBC 16.2*  NEUTROABS 10.6*  HGB 10.4*  HCT 34.4*  MCV 85.6  PLT  951*   Basic Metabolic Panel: Recent Labs  Lab 12/16/17 1146  NA 136  K 3.9  CL 94*  CO2 33*  GLUCOSE 130*  BUN 9  CREATININE 0.70  CALCIUM 10.6*   GFR: Estimated Creatinine Clearance: 55.4 mL/min (by C-G formula based on SCr of 0.7 mg/dL). Liver Function Tests: Recent Labs  Lab 12/16/17 1146  AST 17  ALT 13  ALKPHOS 84  BILITOT 0.5  PROT 7.7  ALBUMIN 2.6*   No results for input(s): LIPASE, AMYLASE in the last 168 hours. No results for input(s): AMMONIA in the last 168 hours. Coagulation Profile: Recent Labs  Lab 12/16/17 1146  INR 0.97   Cardiac Enzymes: No results for input(s): CKTOTAL, CKMB, CKMBINDEX, TROPONINI in the last 168 hours. BNP (last 3 results) No results for input(s): PROBNP in the last 8760 hours. HbA1C: No results for input(s): HGBA1C in the last 72 hours. CBG: No results for input(s): GLUCAP in the last 168 hours. Lipid Profile: No results for input(s): CHOL, HDL, LDLCALC, TRIG, CHOLHDL, LDLDIRECT in the last 72 hours. Thyroid Function Tests: No results for input(s): TSH, T4TOTAL, FREET4, T3FREE, THYROIDAB in the last 72 hours. Anemia Panel: No results for input(s): VITAMINB12, FOLATE, FERRITIN, TIBC, IRON, RETICCTPCT in the last 72 hours. Urine analysis:    Component Value Date/Time   COLORURINE RED (A) 12/16/2017 1510   APPEARANCEUR HAZY (A) 12/16/2017 1510   APPEARANCEUR Turbid (A) 01/25/2016 1407   LABSPEC 1.025 12/16/2017 1510   PHURINE 6.5 12/16/2017 1510   GLUCOSEU NEGATIVE 12/16/2017 1510   HGBUR LARGE (A) 12/16/2017 1510   BILIRUBINUR NEGATIVE 12/16/2017 1510   BILIRUBINUR Negative 01/25/2016 1407   KETONESUR TRACE (A) 12/16/2017 1510   PROTEINUR 100 (A) 12/16/2017 1510   UROBILINOGEN 0.2 05/04/2014 0956   NITRITE NEGATIVE 12/16/2017 1510   LEUKOCYTESUR LARGE (A) 12/16/2017 1510   LEUKOCYTESUR 3+ (A) 01/25/2016 1407    Radiological Exams on Admission: Dg Chest 2 View  Result Date: 12/16/2017 CLINICAL DATA:  Suspected  sepsis.  Cough and congestion. EXAM: CHEST - 2 VIEW COMPARISON:  October 16, 2017 FINDINGS: No pneumothorax. Scarring and nodularity in the right upper lung is stable since the April 23, 2017 CT. Opacity seen in the right mid lung, in the region of a right-sided EKG lead. Emphysematous changes in the lungs. The heart, hila, and mediastinum are unremarkable. No other acute abnormalities identified. IMPRESSION: 1. There is mild focal opacity in the right mid lung in the region of an EKG lead. A subtle focal infiltrate is possible. Recommend short-term follow-up to ensure resolution. 2. Scarring and nodularity in the right upper lobe is stable as above. 3. Emphysematous changes in the lungs. Electronically Signed   By: Dorise Bullion III M.D   On: 12/16/2017 13:05    EKG: Independently reviewed. *Sinus tachycardia rate of 109 no specific ST changes inferiorly  Assessment/Plan Principal Problem:   Sepsis ([place]) Active Problems:   Bladder cancer (HCC)   Anxiety   COPD (chronic obstructive pulmonary disease) (HCC)   Chronic respiratory failure with hypoxia (HCC)   Chronic anemia   Penile bleeding  Chronic pain   -Inpatient admission to [facility] long telemetry.  Patient has received his IV fluid bolus.  Treat empirically with IV cefepime and vancomycin.  Follow cultures.  Repeat lactic acid pending.  proCalcitonin  Pending  -Urology consultation -Consult respiratory  therapy for pulmonary toilet. -cont home oxygen supplementation -Hemoglobin stable coags within normal limits follow -Cont home medications for anxiety and chronic pain  DVT prophylaxis: SCDs  Code Status: DNR  Disposition Plan: Discharge back to SNF 3 to 4 days  Consults called: ED call urology Dr. Tammi Klippel  Admission status: In Patient telemetry  Jensen Kilburg Johnson-Pitts MD [facility] Pager [phone]  If 7PM-7AM, please contact night-coverage [email or web address] Password [email or web address]  12/16/2017, 4:55 PM

## 2017-12-16 NOTE — ED Notes (Signed)
Date and time results received: 12/16/17 1443 (use smartphrase ".now" to insert current time)  Test: Lactic Acid Critical Value:2.00 Name of Provider Notified: Dr Reather Converse Orders Received? Or Actions Taken?:NA

## 2017-12-16 NOTE — ED Notes (Signed)
Date and time results received: 12/16/17 1229 (use smartphrase ".now" to insert current time)  Test: Lactic Acid Critical Value: 2.18  Name of Provider Notified: Dr Reather Converse Orders Received? Or Actions Taken?:NA

## 2017-12-16 NOTE — ED Triage Notes (Signed)
Pt brought to ED via [facility] from [facility]. Pt with hx of bladder cancer and had foley removed at facility. Facility was unable to replace foley and pt had bleeding when attempted so was brought to ED.

## 2017-12-16 NOTE — ED Notes (Signed)
Dr Reather Converse notified of pt and vitals.

## 2017-12-16 NOTE — ED Notes (Signed)
Spoke with [employer] loss adjuster at [facility]. Admitted there 10/21/17 and penis shaft and head already swollen when he arrived there. No rawness to head of penis when arrived there but RN states has been intermittent since arrived and sometimes bleeds bc pt picks at it. States took cath out this am due to not draining after flushing and started bleeding from penis. Attempted to put back in but could not.

## 2017-12-16 NOTE — Progress Notes (Signed)
Pharmacy Note:  Initial antibiotic(s) regimen of Vancomycin and Cefepime ordered by EDP to treat Sepsis.  Estimated Creatinine Clearance: 55.4 mL/min (by C-G formula based on SCr of 0.7 mg/dL).   Allergies  Allergen Reactions  . Metrizamide Hives  . Codeine   . Iohexol Hives     Code: HIVES, Desc: PT STATES HE BROKE OUT IN HIVES AND RASH AFTER CT NECK EARLY SEPT 2011; NO RESP PROBLEMS; NEEDS PRE-MEDS; MKS, Onset Date: [id number]   . Ivp Dye [Iodinated Diagnostic Agents] Hives  . Other     Broccoli, Cauliflower     Vitals:   12/16/17 1530 12/16/17 1630  BP: 112/84 97/78  Pulse:  (!) 134  Resp: (!) 27 (!) 29  Temp:    SpO2:  91%    Anti-infectives (From admission, onward)   Start     Dose/Rate Route Frequency Ordered Stop   12/16/17 1700  vancomycin (VANCOCIN) IVPB 750 mg/150 ml premix     750 mg 150 mL/hr over 60 Minutes Intravenous  Once 12/16/17 1555     12/16/17 1700  ceFEPIme (MAXIPIME) 1 g in sodium chloride 0.9 % 100 mL IVPB     1 g 200 mL/hr over 30 Minutes Intravenous  Once 12/16/17 1649     12/16/17 1530  cefTRIAXone (ROCEPHIN) 1 g in sodium chloride 0.9 % 100 mL IVPB     1 g 200 mL/hr over 30 Minutes Intravenous  Once 12/16/17 1515 12/16/17 1555     Antimicrobials this admission:  Vanc 9/3 >>  Cefepime 9/3 >>   Dose adjustments this admission:  n/a   Microbiology results:  9/3 BCx: pending 9/3 UCx: pending   Sputum:    MRSA PCR:    Plan: Initial dose(s) of Vancomycin and Cefepime X 1 ordered. F/U admission orders for further dosing if therapy continued.  Pricilla Larsson, [facility] 12/16/2017 4:50 PM

## 2017-12-17 DIAGNOSIS — J42 Unspecified chronic bronchitis: Secondary | ICD-10-CM

## 2017-12-17 DIAGNOSIS — J9611 Chronic respiratory failure with hypoxia: Secondary | ICD-10-CM

## 2017-12-17 DIAGNOSIS — N4889 Other specified disorders of penis: Secondary | ICD-10-CM

## 2017-12-17 DIAGNOSIS — A419 Sepsis, unspecified organism: Secondary | ICD-10-CM

## 2017-12-17 DIAGNOSIS — D649 Anemia, unspecified: Secondary | ICD-10-CM

## 2017-12-17 DIAGNOSIS — F419 Anxiety disorder, unspecified: Secondary | ICD-10-CM

## 2017-12-17 DIAGNOSIS — C679 Malignant neoplasm of bladder, unspecified: Secondary | ICD-10-CM

## 2017-12-17 LAB — CBC
HEMATOCRIT: 29.1 % — AB (ref 39.0–52.0)
Hemoglobin: 9 g/dL — ABNORMAL LOW (ref 13.0–17.0)
MCH: 26.2 pg (ref 26.0–34.0)
MCHC: 30.9 g/dL (ref 30.0–36.0)
MCV: 84.6 fL (ref 78.0–100.0)
PLATELETS: 526 10*3/uL — AB (ref 150–400)
RBC: 3.44 MIL/uL — ABNORMAL LOW (ref 4.22–5.81)
RDW: 19.8 % — AB (ref 11.5–15.5)
WBC: 17.7 10*3/uL — AB (ref 4.0–10.5)

## 2017-12-17 LAB — BASIC METABOLIC PANEL
ANION GAP: 10 (ref 5–15)
BUN: 10 mg/dL (ref 8–23)
CALCIUM: 10.3 mg/dL (ref 8.9–10.3)
CO2: 29 mmol/L (ref 22–32)
Chloride: 99 mmol/L (ref 98–111)
Creatinine, Ser: 0.65 mg/dL (ref 0.61–1.24)
GFR calc Af Amer: >60 mL/min (ref >60)
GFR calc non Af Amer: >60 mL/min (ref >60)
Glucose, Bld: 123 mg/dL — ABNORMAL HIGH (ref 70–99)
Potassium: 4 mmol/L (ref 3.5–5.1)
Sodium: 138 mmol/L (ref 135–145)

## 2017-12-17 LAB — MRSA PCR SCREENING: MRSA by PCR: POSITIVE — AB

## 2017-12-17 MED ORDER — SODIUM CHLORIDE 0.9 % IV SOLN
INTRAVENOUS | Status: DC
  Administered 2017-12-19: 09:00:00 via INTRAVENOUS

## 2017-12-17 MED ORDER — CHLORHEXIDINE GLUCONATE CLOTH 2 % EX PADS
6.0000 | MEDICATED_PAD | Freq: Every day | CUTANEOUS | Status: DC
  Administered 2017-12-17 – 2017-12-19 (×3): 6 via TOPICAL

## 2017-12-17 MED ORDER — SODIUM CHLORIDE 0.9 % IV BOLUS
250.0000 mL | Freq: Once | INTRAVENOUS | Status: AC
  Administered 2017-12-17: 250 mL via INTRAVENOUS

## 2017-12-17 MED ORDER — VANCOMYCIN HCL IN DEXTROSE 1-5 GM/200ML-% IV SOLN
1000.0000 mg | INTRAVENOUS | Status: DC
  Administered 2017-12-17: 1000 mg via INTRAVENOUS

## 2017-12-17 MED ORDER — MUPIROCIN 2 % EX OINT
1.0000 "application " | TOPICAL_OINTMENT | Freq: Two times a day (BID) | CUTANEOUS | Status: DC
  Administered 2017-12-17 – 2017-12-19 (×6): 1 via NASAL
  Filled 2017-12-17 (×3): qty 22

## 2017-12-17 NOTE — Progress Notes (Signed)
CSW notified that pt is resident of [facility], [place] admission with [facility]. Will follow during hospitalization for needs and assist with disposition.   Sharren Bridge, MSW, LCSW Clinical Social Work 12/17/2017 [phone]

## 2017-12-17 NOTE — Consult Note (Signed)
Urology Consult  Referring physician: Curly Shores Reason for referral: Hematuria  Chief Complaint: hematuria   History of Present Illness: Patient known to [facility] and Dr Gloriann Loan for advanced bladder cancer in face of significant co-morbidities; high grade tumor; ventilation issues post TUR lengthy stay [facility]; prev BCG; not candidate for cystectomy; unable to pass cathter yesterday in ER; started on iv antibiotics; SOB; penile pain; treated as sepsis;   Poor historian; recent gross hematuria; burns (burning may be chronic) good output and no pain otherwise  Spoke with nursing: good urine output with and without incontinence  Modifying factors: There are no other modifying factors  Associated signs and symptoms: There are no other associated signs and symptoms Aggravating and relieving factors: There are no other aggravating or relieving factors Severity: Moderate Duration: Persistent  Early blood c/s normal CBC: Hb 9 (stable) WBC: 17.7 Cr: .65    Past Medical History:  Diagnosis Date  . Anxiety 03/13/2012  . Asthma   . 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  Marland Kitchen BPH (benign prostatic hyperplasia) 10/01/2011   Per cystoscopy  . Cancer ([place])    bladder  . Chronic anxiety   . Chronic back pain   . COPD (chronic obstructive pulmonary disease) (HCC)    a. on 4-5L [place] at baseline  . Depression   . Emphysema lung ([place])   . GERD (gastroesophageal reflux disease)   . Headache(784.0)   . History of stomach ulcers   . Hypercholesteremia   . Hyperlipidemia 03/13/2012  . Pulmonary nodule 09/2011   Stable appearance  . Tobacco abuse 09/29/2011   Past Surgical History:  Procedure Laterality Date  . CHOLECYSTECTOMY    . CYSTOSCOPY  09/30/2011   Procedure: CYSTOSCOPY FLEXIBLE;  Surgeon: Marissa Nestle, MD;  Location: [facility];  Service: Urology;  Laterality: N/A;  . FRACTURE SURGERY     R ring finger, pin placed  . TRANSURETHRAL RESECTION OF BLADDER TUMOR   10/01/2011   Procedure: TRANSURETHRAL RESECTION OF BLADDER TUMOR (TURBT);  Surgeon: Marissa Nestle, MD;  Location: [facility];  Service: Urology;  Laterality: N/A;    Medications: I have reviewed the patient's current medications. Allergies:  Allergies  Allergen Reactions  . Metrizamide Hives  . Codeine   . Iohexol Hives     Code: HIVES, Desc: PT STATES HE BROKE OUT IN HIVES AND RASH AFTER CT NECK EARLY SEPT 2011; NO RESP PROBLEMS; NEEDS PRE-MEDS; MKS, Onset Date: [id number]   . Ivp Dye [Iodinated Diagnostic Agents] Hives  . Other     Broccoli, Cauliflower     Family History  Problem Relation Age of Onset  . Kidney failure Mother   . Emphysema Brother        was not close to family; does not truly know their medical problems.  . Emphysema Brother   . Emphysema Brother   . Cancer Neg Hx    Social History:  reports that he has been smoking cigarettes. He has a 29.00 pack-year smoking history. He has never used smokeless tobacco. He reports that he does not drink alcohol or use drugs.  ROS: All systems are reviewed and negative except as noted. rest negative  Physical Exam:  Vital signs in last 24 hours: Temp:  [97.8 F (36.6 C)-99.1 F (37.3 C)] 97.8 F (36.6 C) (09/04 1122) Pulse Rate:  [81-134] 93 (09/04 1122) Resp:  [14-29] 18 (09/04 1122) BP: (80-119)/(60-87) 91/60 (09/04 1122) SpO2:  [91 %-100 %] 98 % (09/04  1122) Weight:  [49.9 kg-61.1 kg] 61.1 kg (09/04 0646)  Cardiovascular: Skin warm; not flushed Respiratory: Breaths quiet; no shortness of breath Abdomen: No masses Neurological: Normal sensation to touch Musculoskeletal: Normal motor function arms and legs Lymphatics: No inguinal adenopathy Skin: No rashes Genitourinary:woody corporal bodies with redness and ? Ulcer glans penis; belly soft and no palpable bladder  Laboratory Data:  Results for orders placed or performed during the hospital encounter of 12/16/17 (from the past 72 hour(s))  Comprehensive  metabolic panel     Status: Abnormal   Collection Time: 12/16/17 11:46 AM  Result Value Ref Range   Sodium 136 135 - 145 mmol/L   Potassium 3.9 3.5 - 5.1 mmol/L   Chloride 94 (L) 98 - 111 mmol/L   CO2 33 (H) 22 - 32 mmol/L   Glucose, Bld 130 (H) 70 - 99 mg/dL   BUN 9 8 - 23 mg/dL   Creatinine, Ser 0.70 0.61 - 1.24 mg/dL   Calcium 10.6 (H) 8.9 - 10.3 mg/dL   Total Protein 7.7 6.5 - 8.1 g/dL   Albumin 2.6 (L) 3.5 - 5.0 g/dL   AST 17 15 - 41 U/L   ALT 13 0 - 44 U/L   Alkaline Phosphatase 84 38 - 126 U/L   Total Bilirubin 0.5 0.3 - 1.2 mg/dL   GFR calc non Af Amer >60 >60 mL/min   GFR calc Af Amer >60 >60 mL/min    Comment: (NOTE) The eGFR has been calculated using the CKD EPI equation. This calculation has not been validated in all clinical situations. eGFR's persistently <60 mL/min signify possible Chronic Kidney Disease.    Anion gap 9 5 - 15    Comment: Performed at [facility], [address], [place], [place] [address]  CBC with Differential     Status: Abnormal   Collection Time: 12/16/17 11:46 AM  Result Value Ref Range   WBC 16.2 (H) 4.0 - 10.5 K/uL   RBC 4.02 (L) 4.22 - 5.81 MIL/uL   Hemoglobin 10.4 (L) 13.0 - 17.0 g/dL   HCT 34.4 (L) 39.0 - 52.0 %   MCV 85.6 78.0 - 100.0 fL   MCH 25.9 (L) 26.0 - 34.0 pg   MCHC 30.2 30.0 - 36.0 g/dL   RDW 19.7 (H) 11.5 - 15.5 %   Platelets 524 (H) 150 - 400 K/uL   Neutrophils Relative % 65 %   Neutro Abs 10.6 (H) 1.7 - 7.7 K/uL   Lymphocytes Relative 23 %   Lymphs Abs 3.7 0.7 - 4.0 K/uL   Monocytes Relative 8 %   Monocytes Absolute 1.3 (H) 0.1 - 1.0 K/uL   Eosinophils Relative 4 %   Eosinophils Absolute 0.6 0.0 - 0.7 K/uL   Basophils Relative 0 %   Basophils Absolute 0.0 0.0 - 0.1 K/uL    Comment: Performed at [facility], [address] [place], [place] [address]  Protime-INR     Status: None   Collection Time: 12/16/17 11:46 AM  Result Value Ref Range   Prothrombin Time 12.8 11.4 - 15.2 seconds   INR 0.97     Comment:  Performed at [facility], [address] [place], [place] [address]  Culture, blood (Routine x 2)     Status: None (Preliminary result)   Collection Time: 12/16/17 11:50 AM  Result Value Ref Range   Specimen Description BLOOD LEFT ANTECUBITAL    Special Requests      BOTTLES DRAWN AEROBIC AND ANAEROBIC Blood Culture results may  not be optimal due to an excessive volume of blood received in culture bottles   Culture      NO GROWTH < 24 HOURS Performed at [facility], [address] [place], [place] [address]    Report Status PENDING   Culture, blood (Routine x 2)     Status: None (Preliminary result)   Collection Time: 12/16/17 11:56 AM  Result Value Ref Range   Specimen Description BLOOD LEFT FOREARM    Special Requests      BOTTLES DRAWN AEROBIC AND ANAEROBIC Blood Culture adequate volume   Culture      NO GROWTH < 24 HOURS Performed at [facility], [address] [place], [place] [address]    Report Status PENDING   I-Stat CG4 Lactic Acid, ED     Status: Abnormal   Collection Time: 12/16/17 12:09 PM  Result Value Ref Range   Lactic Acid, Venous 2.18 (HH) 0.5 - 1.9 mmol/L   Comment NOTIFIED PHYSICIAN   I-Stat CG4 Lactic Acid, ED     Status: Abnormal   Collection Time: 12/16/17  2:34 PM  Result Value Ref Range   Lactic Acid, Venous 2.00 (HH) 0.5 - 1.9 mmol/L   Comment MD NOTIFIED, REPEAT TEST   Urinalysis, Routine w reflex microscopic     Status: Abnormal   Collection Time: 12/16/17  3:10 PM  Result Value Ref Range   Color, Urine RED (A) YELLOW    Comment: BIOCHEMICALS MAY BE AFFECTED BY COLOR   APPearance HAZY (A) CLEAR   Specific Gravity, Urine 1.025 1.005 - 1.030   pH 6.5 5.0 - 8.0   Glucose, UA NEGATIVE NEGATIVE mg/dL   Hgb urine dipstick LARGE (A) NEGATIVE   Bilirubin Urine NEGATIVE NEGATIVE   Ketones, ur TRACE (A) NEGATIVE mg/dL   Protein, ur 100 (A) NEGATIVE mg/dL   Nitrite NEGATIVE NEGATIVE   Leukocytes, UA LARGE (A) NEGATIVE    Comment: Performed at [facility], [address], [place], [place] [address]  Urinalysis, Microscopic (reflex)     Status: Abnormal   Collection Time: 12/16/17  3:10 PM  Result Value Ref Range   RBC / HPF >50 0 - 5 RBC/hpf   WBC, UA 21-50 0 - 5 WBC/hpf   Bacteria, UA MANY (A) NONE SEEN   Squamous Epithelial / LPF 0-5 0 - 5    Comment: Performed at [facility], [address], [place],  [address]  Procalcitonin - Baseline     Status: None   Collection Time: 12/16/17  3:45 PM  Result Value Ref Range   Procalcitonin <0.10 ng/mL    Comment:        Interpretation: PCT (Procalcitonin) <= 0.5 ng/mL: Systemic infection (sepsis) is not likely. Local bacterial infection is possible. (NOTE)       Sepsis PCT Algorithm           Lower Respiratory Tract                                      Infection PCT Algorithm    ----------------------------     ----------------------------         PCT < 0.25 ng/mL                PCT < 0.10 ng/mL         Strongly encourage             Strongly discourage  discontinuation of antibiotics    initiation of antibiotics    ----------------------------     -----------------------------       PCT 0.25 - 0.50 ng/mL            PCT 0.10 - 0.25 ng/mL               OR       >80% decrease in PCT            Discourage initiation of                                            antibiotics      Encourage discontinuation           of antibiotics    ----------------------------     -----------------------------         PCT >= 0.50 ng/mL              PCT 0.26 - 0.50 ng/mL               AND        <80% decrease in PCT             Encourage initiation of                                             antibiotics       Encourage continuation           of antibiotics    ----------------------------     -----------------------------        PCT >= 0.50 ng/mL                  PCT > 0.50 ng/mL               AND         increase in PCT                  Strongly encourage                                       initiation of antibiotics    Strongly encourage escalation           of antibiotics                                     -----------------------------                                           PCT <= 0.25 ng/mL                                                 OR                                        >  80% decrease in PCT                                     Discontinue / Do not initiate                                             antibiotics Performed at [facility], [address] [place], [place] [address]   MRSA PCR Screening     Status: Abnormal   Collection Time: 12/17/17  4:30 AM  Result Value Ref Range   MRSA by PCR POSITIVE (A) NEGATIVE    Comment:        The GeneXpert MRSA Assay (FDA approved for NASAL specimens only), is one component of a comprehensive MRSA colonization surveillance program. It is not intended to diagnose MRSA infection nor to guide or monitor treatment for MRSA infections. RESULT CALLED TO, READ BACK BY AND VERIFIED WITH: MURRELL,K RN AT 7867 12/17/17 BY TIBBITTS,K Performed at [facility] [address], [place], [place] [address]   CBC     Status: Abnormal   Collection Time: 12/17/17  4:40 AM  Result Value Ref Range   WBC 17.7 (H) 4.0 - 10.5 K/uL   RBC 3.44 (L) 4.22 - 5.81 MIL/uL   Hemoglobin 9.0 (L) 13.0 - 17.0 g/dL   HCT 29.1 (L) 39.0 - 52.0 %   MCV 84.6 78.0 - 100.0 fL   MCH 26.2 26.0 - 34.0 pg   MCHC 30.9 30.0 - 36.0 g/dL   RDW 19.8 (H) 11.5 - 15.5 %   Platelets 526 (H) 150 - 400 K/uL    Comment: Performed at [facility], [place] [address], [place], [place] [address]  Basic metabolic panel     Status: Abnormal   Collection Time: 12/17/17  4:40 AM  Result Value Ref Range   Sodium 138 135 - 145 mmol/L   Potassium 4.0 3.5 - 5.1 mmol/L   Chloride 99 98 - 111 mmol/L   CO2 29 22 - 32 mmol/L   Glucose, Bld 123 (H) 70 - 99 mg/dL   BUN 10 8 - 23 mg/dL   Creatinine, Ser 0.65 0.61 - 1.24 mg/dL    Calcium 10.3 8.9 - 10.3 mg/dL   GFR calc non Af Amer >60 >60 mL/min   GFR calc Af Amer >60 >60 mL/min    Comment: (NOTE) The eGFR has been calculated using the CKD EPI equation. This calculation has not been validated in all clinical situations. eGFR's persistently <60 mL/min signify possible Chronic Kidney Disease.    Anion gap 10 5 - 15    Comment: Performed at [facility], [place] [address] [place], [place] [address]   Recent Results (from the past 240 hour(s))  Culture, blood (Routine x 2)     Status: None (Preliminary result)   Collection Time: 12/16/17 11:50 AM  Result Value Ref Range Status   Specimen Description BLOOD LEFT ANTECUBITAL  Final   Special Requests   Final    BOTTLES DRAWN AEROBIC AND ANAEROBIC Blood Culture results may not be optimal due to an excessive volume of blood received in culture bottles   Culture   Final    NO GROWTH < 24 HOURS Performed at [facility], [address] [place], [place] [address]  Report Status PENDING  Incomplete  Culture, blood (Routine x 2)     Status: None (Preliminary result)   Collection Time: 12/16/17 11:56 AM  Result Value Ref Range Status   Specimen Description BLOOD LEFT FOREARM  Final   Special Requests   Final    BOTTLES DRAWN AEROBIC AND ANAEROBIC Blood Culture adequate volume   Culture   Final    NO GROWTH < 24 HOURS Performed at [facility], [address] [place], [place] [address]    Report Status PENDING  Incomplete  MRSA PCR Screening     Status: Abnormal   Collection Time: 12/17/17  4:30 AM  Result Value Ref Range Status   MRSA by PCR POSITIVE (A) NEGATIVE Final    Comment:        The GeneXpert MRSA Assay (FDA approved for NASAL specimens only), is one component of a comprehensive MRSA colonization surveillance program. It is not intended to diagnose MRSA infection nor to guide or monitor treatment for MRSA infections. RESULT CALLED TO, READ BACK BY AND VERIFIED WITH: MURRELL,K RN  AT 4920 12/17/17 BY TIBBITTS,K Performed at [facility] [address], [place], [place] [address]    Creatinine: Recent Labs    12/16/17 1146 12/17/17 0440  CREATININE 0.70 0.65    Xrays: See report/chart none  Impression/Assessment:  Advanced bladder cancer; possible extension to penis; not clinically in retention; no need for foley now  Plan:  Treat as a UTI with urine c/s pending; keep on broad spectrim pending c/s results; unfortunately no local therapy can be offered; only manage with foley if clot retention and/or perc tubes; not good prognosis; will let Dr Gloriann Loan know re admission; OK to send home with gross hematuria  Kessler Solly A 12/17/2017, 12:18 PM

## 2017-12-17 NOTE — Progress Notes (Signed)
PROGRESS NOTE    [name] [name]  WUJ:[id number] DOB: January 25, 1941 DOA: 12/16/2017 PCP: [name], MD   Brief Narrative:  HPI on 12/16/2017 by Dr. Wells Guiles Johnson-Pitts [name] [name] is a 77 y.o. male with medical history significant of hypertension COPD, chronic respiratory failure on nasal cannula, bladder cancer, anxiety depression who presents from the nursing home with penile bleeding.  Per report the nursing home was unable to place a Foley.  Patient was also unable to get a Foley placed here in the ED.  UA was sent and showed red blood cells and also suggestive of urinary tract infection.  Patient's last 2 urine cultures have been negative with no growth.  he was tachycardic and had soft blood pressures upon  presentation to the ED.  He also has significant leukocytosis of 16,000.  Patient was empirically given vancomycin and Rocephin in the ED.  The 1 L of IV fluids now completed his sepsis bolus with additional 500 transfused.  He has also been having increased rhonchi.  He states his baseline shortness of breath is unchanged.  Chest x-ray showed questionable right sided infiltrate.  Patient complains mainly of just heartburn and  penile pain.  Denies any fever but does have a cough.    Assessment & Plan   Sepsis secondary to UTI with hematuria  -Patient with tachycardia, tachypnea, leukocytosis, elevated lactic acid of 2.18 -UA shows large leukocytes, many bacteria, 21-50 WBC -Urine culture pending -Blood cultures showed no growth to date -Continue vancomycin and cefepime  Advanced bladder cancer -follows with urology, Dr. Gloriann Loan and [facility] -Urology consulted and appreciated, recommended treating this is a urinary tract infection.  Unfortunately no local therapy can be offered.  Patient does not have a good prognosis.  Okay to send him home with gross hematuria. -Of note patient has had previous B CG, is not a candidate for cystectomy -Foley catheter was unable to be placed by the  emergency department on 12/16/2016 -patient followed by [facility]  COPD -Currently stable, no wheezing on exam -Continue Daliresp, Singulair  Hypotension -Hold Lasix, placed on IV fluid and continue to monitor closely  Depression/Anxiety -Continue Celexa, Cymbalta, and Ativan as needed  DVT Prophylaxis  SCDs  Code Status: DNR  Family Communication: None at bedside  Disposition Plan: Admitted. Pending urine culture and improvement in sepsis morphology. Suspect discharge to SNF in 1-2 days. Continue IVF and antibiotics.   Consultants Urology  Procedures  None  Antibiotics   Anti-infectives (From admission, onward)   Start     Dose/Rate Route Frequency Ordered Stop   12/17/17 1700  vancomycin (VANCOCIN) IVPB 1000 mg/200 mL premix  Status:  Discontinued     1,000 mg 200 mL/hr over 60 Minutes Intravenous Every 24 hours 12/16/17 1939 12/17/17 1322   12/17/17 1400  vancomycin (VANCOCIN) IVPB 1000 mg/200 mL premix     1,000 mg 200 mL/hr over 60 Minutes Intravenous Every 24 hours 12/17/17 1322     12/17/17 0100  ceFEPIme (MAXIPIME) 1 g in sodium chloride 0.9 % 100 mL IVPB     1 g 200 mL/hr over 30 Minutes Intravenous Every 8 hours 12/16/17 1938     12/16/17 1945  vancomycin (VANCOCIN) 500 mg in sodium chloride 0.9 % 100 mL IVPB     500 mg 100 mL/hr over 60 Minutes Intravenous  Once 12/16/17 1938 12/16/17 2123   12/16/17 1845  vancomycin (VANCOCIN) IVPB 1000 mg/200 mL premix  Status:  Discontinued     1,000 mg  200 mL/hr over 60 Minutes Intravenous  Once 12/16/17 1842 12/16/17 1907   12/16/17 1845  ceFEPIme (MAXIPIME) 2 g in sodium chloride 0.9 % 100 mL IVPB  Status:  Discontinued     2 g 200 mL/hr over 30 Minutes Intravenous  Once 12/16/17 1842 12/16/17 1907   12/16/17 1700  vancomycin (VANCOCIN) IVPB 750 mg/150 ml premix  Status:  Discontinued     750 mg 150 mL/hr over 60 Minutes Intravenous  Once 12/16/17 1555 12/17/17 1321   12/16/17 1700  ceFEPIme (MAXIPIME) 1 g in  sodium chloride 0.9 % 100 mL IVPB     1 g 200 mL/hr over 30 Minutes Intravenous  Once 12/16/17 1649 12/16/17 1729   12/16/17 1530  cefTRIAXone (ROCEPHIN) 1 g in sodium chloride 0.9 % 100 mL IVPB     1 g 200 mL/hr over 30 Minutes Intravenous  Once 12/16/17 1515 12/16/17 1555      Subjective:   [name] [name] seen and examined today.  Patient has no complaints this morning.  Denies current chest pain, shortness of breath, abdominal pain, nausea vomiting, diarrhea constipation. Continues to have burning urination as well as frank blood. Objective:   Vitals:   12/16/17 2233 12/17/17 0646 12/17/17 0900 12/17/17 1122  BP: 95/67 (!) 87/62 (!) 80/60 91/60  Pulse: (!) 102 89 81 93  Resp: 14 20  18   Temp: 98.4 F (36.9 C) 98.2 F (36.8 C)  97.8 F (36.6 C)  TempSrc: Oral Oral  Oral  SpO2: 97% 98%  98%  Weight:  61.1 kg    Height:        Intake/Output Summary (Last 24 hours) at 12/17/2017 1406 Last data filed at 12/17/2017 1049 Gross per 24 hour  Intake 2160 ml  Output -  Net 2160 ml   Filed Weights   12/16/17 1235 12/16/17 1857 12/17/17 0646  Weight: 49.9 kg 59.5 kg 61.1 kg   Exam  General: Well developed, well nourished, NAD, appears stated age  54: NCAT, mucous membranes moist.   Neck: Supple  Cardiovascular: S1 S2 auscultated, no murmurs, RRR  Respiratory: Clear to auscultation bilaterally with equal chest rise  Abdomen: Soft, nontender, nondistended, + bowel sounds  Extremities: warm dry without cyanosis clubbing or edema  GU: redness glans penis  Neuro: AAOx3, nonfocal  Psych: Normal affect and demeanor  Data Reviewed: I have personally reviewed following labs and imaging studies  CBC: Recent Labs  Lab 12/16/17 1146 12/17/17 0440  WBC 16.2* 17.7*  NEUTROABS 10.6*  --   HGB 10.4* 9.0*  HCT 34.4* 29.1*  MCV 85.6 84.6  PLT 524* 371*   Basic Metabolic Panel: Recent Labs  Lab 12/16/17 1146 12/17/17 0440  NA 136 138  K 3.9 4.0  CL 94* 99  CO2 33*  29  GLUCOSE 130* 123*  BUN 9 10  CREATININE 0.70 0.65  CALCIUM 10.6* 10.3   GFR: Estimated Creatinine Clearance: 67.9 mL/min (by C-G formula based on SCr of 0.65 mg/dL). Liver Function Tests: Recent Labs  Lab 12/16/17 1146  AST 17  ALT 13  ALKPHOS 84  BILITOT 0.5  PROT 7.7  ALBUMIN 2.6*   No results for input(s): LIPASE, AMYLASE in the last 168 hours. No results for input(s): AMMONIA in the last 168 hours. Coagulation Profile: Recent Labs  Lab 12/16/17 1146  INR 0.97   Cardiac Enzymes: No results for input(s): CKTOTAL, CKMB, CKMBINDEX, TROPONINI in the last 168 hours. BNP (last 3 results) No results for input(s): PROBNP in  the last 8760 hours. HbA1C: No results for input(s): HGBA1C in the last 72 hours. CBG: No results for input(s): GLUCAP in the last 168 hours. Lipid Profile: No results for input(s): CHOL, HDL, LDLCALC, TRIG, CHOLHDL, LDLDIRECT in the last 72 hours. Thyroid Function Tests: No results for input(s): TSH, T4TOTAL, FREET4, T3FREE, THYROIDAB in the last 72 hours. Anemia Panel: No results for input(s): VITAMINB12, FOLATE, FERRITIN, TIBC, IRON, RETICCTPCT in the last 72 hours. Urine analysis:    Component Value Date/Time   COLORURINE RED (A) 12/16/2017 1510   APPEARANCEUR HAZY (A) 12/16/2017 1510   APPEARANCEUR Turbid (A) 01/25/2016 1407   LABSPEC 1.025 12/16/2017 1510   PHURINE 6.5 12/16/2017 1510   GLUCOSEU NEGATIVE 12/16/2017 1510   HGBUR LARGE (A) 12/16/2017 1510   BILIRUBINUR NEGATIVE 12/16/2017 1510   BILIRUBINUR Negative 01/25/2016 1407   KETONESUR TRACE (A) 12/16/2017 1510   PROTEINUR 100 (A) 12/16/2017 1510   UROBILINOGEN 0.2 05/04/2014 0956   NITRITE NEGATIVE 12/16/2017 1510   LEUKOCYTESUR LARGE (A) 12/16/2017 1510   LEUKOCYTESUR 3+ (A) 01/25/2016 1407   Sepsis Labs: @LABRCNTIP (procalcitonin:4,lacticidven:4)  ) Recent Results (from the past 240 hour(s))  Culture, blood (Routine x 2)     Status: None (Preliminary result)    Collection Time: 12/16/17 11:50 AM  Result Value Ref Range Status   Specimen Description BLOOD LEFT ANTECUBITAL  Final   Special Requests   Final    BOTTLES DRAWN AEROBIC AND ANAEROBIC Blood Culture results may not be optimal due to an excessive volume of blood received in culture bottles   Culture   Final    NO GROWTH < 24 HOURS Performed at [facility], [address], [place], [place] [address]    Report Status PENDING  Incomplete  Culture, blood (Routine x 2)     Status: None (Preliminary result)   Collection Time: 12/16/17 11:56 AM  Result Value Ref Range Status   Specimen Description BLOOD LEFT FOREARM  Final   Special Requests   Final    BOTTLES DRAWN AEROBIC AND ANAEROBIC Blood Culture adequate volume   Culture   Final    NO GROWTH < 24 HOURS Performed at [facility], [address] [place], [place] [address]    Report Status PENDING  Incomplete  MRSA PCR Screening     Status: Abnormal   Collection Time: 12/17/17  4:30 AM  Result Value Ref Range Status   MRSA by PCR POSITIVE (A) NEGATIVE Final    Comment:        The GeneXpert MRSA Assay (FDA approved for NASAL specimens only), is one component of a comprehensive MRSA colonization surveillance program. It is not intended to diagnose MRSA infection nor to guide or monitor treatment for MRSA infections. RESULT CALLED TO, READ BACK BY AND VERIFIED WITH: MURRELL,K RN AT 7209 12/17/17 BY TIBBITTS,K Performed at [facility] [address], [place], [place] [address]       Radiology Studies: Dg Chest 2 View  Result Date: 12/16/2017 CLINICAL DATA:  Suspected sepsis.  Cough and congestion. EXAM: CHEST - 2 VIEW COMPARISON:  October 16, 2017 FINDINGS: No pneumothorax. Scarring and nodularity in the right upper lung is stable since the April 23, 2017 CT. Opacity seen in the right mid lung, in the region of a right-sided EKG lead. Emphysematous changes in the lungs. The heart, hila, and mediastinum are  unremarkable. No other acute abnormalities identified. IMPRESSION: 1. There is mild focal opacity in the right mid lung in the region  of an EKG lead. A subtle focal infiltrate is possible. Recommend short-term follow-up to ensure resolution. 2. Scarring and nodularity in the right upper lobe is stable as above. 3. Emphysematous changes in the lungs. Electronically Signed   By: Dorise Bullion III M.D   On: 12/16/2017 13:05     Scheduled Meds: . albuterol  2.5 mg Nebulization BID  . Chlorhexidine Gluconate Cloth  6 each Topical Q0600  . citalopram  20 mg Oral Daily  . DULoxetine  60 mg Oral Daily  . furosemide  20 mg Oral Daily  . magnesium oxide  400 mg Oral Daily  . montelukast  10 mg Oral QHS  . mupirocin ointment  1 application Nasal BID  . oxybutynin  5 mg Oral TID  . pantoprazole  40 mg Oral Daily  . roflumilast  500 mcg Oral Daily  . senna-docusate  1 tablet Oral BID   Continuous Infusions: . sodium chloride 50 mL/hr at 12/17/17 1251  . ceFEPime (MAXIPIME) IV 1 g (12/17/17 2440)  . vancomycin       LOS: 1 day   Time Spent in minutes   45 minutes (greater than 50% of time spent with patient face to face, as well as reviewing records, calling consults, and formulating a plan)   Cristal Ford D.O. on 12/17/2017 at 2:06 PM  Between 7am to 7pm - Please see pager noted on [email or web address]  After 7pm go to [email or web address]  And look for the night coverage person covering for me after hours  [facility] Office  [phone]

## 2017-12-18 LAB — LACTIC ACID, PLASMA
LACTIC ACID, VENOUS: 1.8 mmol/L (ref 0.5–1.9)
Lactic Acid, Venous: 3.3 mmol/L (ref 0.5–1.9)
Lactic Acid, Venous: 2.2 mmol/L (ref 0.5–1.9)

## 2017-12-18 LAB — HEMOGLOBIN AND HEMATOCRIT, BLOOD
HCT: 26 % — ABNORMAL LOW (ref 39.0–52.0)
HEMOGLOBIN: 8.2 g/dL — AB (ref 13.0–17.0)

## 2017-12-18 LAB — BASIC METABOLIC PANEL
ANION GAP: 10 (ref 5–15)
BUN: 8 mg/dL (ref 8–23)
CO2: 26 mmol/L (ref 22–32)
Calcium: 9.7 mg/dL (ref 8.9–10.3)
Chloride: 97 mmol/L — ABNORMAL LOW (ref 98–111)
Creatinine, Ser: 0.65 mg/dL (ref 0.61–1.24)
GFR calc Af Amer: >60 mL/min (ref >60)
Glucose, Bld: 97 mg/dL (ref 70–99)
Potassium: 4 mmol/L (ref 3.5–5.1)
SODIUM: 133 mmol/L — AB (ref 135–145)

## 2017-12-18 LAB — CBC
HCT: 25 % — ABNORMAL LOW (ref 39.0–52.0)
HEMOGLOBIN: 7.8 g/dL — AB (ref 13.0–17.0)
MCH: 26 pg (ref 26.0–34.0)
MCHC: 31.2 g/dL (ref 30.0–36.0)
MCV: 83.3 fL (ref 78.0–100.0)
PLATELETS: 500 10*3/uL — AB (ref 150–400)
RBC: 3 MIL/uL — ABNORMAL LOW (ref 4.22–5.81)
RDW: 19.5 % — ABNORMAL HIGH (ref 11.5–15.5)
WBC: 16.2 10*3/uL — AB (ref 4.0–10.5)

## 2017-12-18 LAB — PROCALCITONIN: PROCALCITONIN: 0.23 ng/mL

## 2017-12-18 MED ORDER — SODIUM CHLORIDE 0.9 % IV BOLUS
1000.0000 mL | Freq: Once | INTRAVENOUS | Status: AC
  Administered 2017-12-18: 1000 mL via INTRAVENOUS

## 2017-12-18 MED ORDER — SODIUM CHLORIDE 0.9 % IV SOLN
2.0000 g | INTRAVENOUS | Status: DC
  Administered 2017-12-18: 2 g via INTRAVENOUS
  Filled 2017-12-18: qty 2
  Filled 2017-12-18: qty 20

## 2017-12-18 NOTE — Progress Notes (Signed)
Lab called to report critical lactic acid of 2.2. Result reported to Dr. Ree Kida. No changes to treatment plan at this time.

## 2017-12-18 NOTE — Progress Notes (Signed)
Bladder scanned for post-void residual.  PVR: 139 ml

## 2017-12-18 NOTE — Progress Notes (Signed)
PROGRESS NOTE    [name] [name]  EZM:[id number] DOB: 01-15-1941 DOA: 12/16/2017 PCP: [name], MD   Brief Narrative:  HPI on 12/16/2017 by Dr. Wells Guiles Johnson-Pitts [name] [name] is a 77 y.o. male with medical history significant of hypertension COPD, chronic respiratory failure on nasal cannula, bladder cancer, anxiety depression who presents from the nursing home with penile bleeding.  Per report the nursing home was unable to place a Foley.  Patient was also unable to get a Foley placed here in the ED.  UA was sent and showed red blood cells and also suggestive of urinary tract infection.  Patient's last 2 urine cultures have been negative with no growth.  he was tachycardic and had soft blood pressures upon  presentation to the ED.  He also has significant leukocytosis of 16,000.  Patient was empirically given vancomycin and Rocephin in the ED.  The 1 L of IV fluids now completed his sepsis bolus with additional 500 transfused.  He has also been having increased rhonchi.  He states his baseline shortness of breath is unchanged.  Chest x-ray showed questionable right sided infiltrate.  Patient complains mainly of just heartburn and  penile pain.  Denies any fever but does have a cough.    Interim history Admitted for sepsis secondary to UTI and hematuria.  Cultures currently pending.  Urology consulted and appreciated and recommended treating for the UTI only.  No surgical intervention at this time.  Assessment & Plan   Sepsis secondary to UTI with hematuria  -Patient with tachycardia, tachypnea, leukocytosis, elevated lactic acid of 2.18 -UA shows large leukocytes, many bacteria, 21-50 WBC -Urine culture 40K colonies of proteus mirabilis, pending sensitivities  -Blood cultures showed no growth to date -Continue vancomycin and cefepime -Lactic acid improved, currently 1.8  Advanced bladder cancer -follows with urology, Dr. Gloriann Loan and [facility] -Urology consulted and appreciated, recommended  treating this is a urinary tract infection.  Unfortunately no local therapy can be offered.  Patient does not have a good prognosis.  Okay to send him home with gross hematuria. -Of note patient has had previous BCG, is not a candidate for cystectomy -Foley catheter was unable to be placed by the emergency department on 12/16/2016 -patient followed by [facility]  COPD -Currently stable, no wheezing on exam -Continue Daliresp, Singulair  Hypotension -Hold Lasix, placed on IV fluid and continue to monitor closely -BP has improved  Depression/Anxiety -Continue Celexa, Cymbalta, and Ativan as needed  Normocytic anemia -Baseline hemoglobin approximately 9-10, currently 8.2 -Feel drop in hemoglobin is likely dilutional component given that patient has received IVF -Continue to monitor   DVT Prophylaxis  SCDs  Code Status: DNR  Family Communication: None at bedside  Disposition Plan: Admitted. Pending urine culture and improvement in sepsis morphology. Suspect discharge to SNF in 1-2 days. Continue IVF and antibiotics.   Consultants Urology  Procedures  None  Antibiotics   Anti-infectives (From admission, onward)   Start     Dose/Rate Route Frequency Ordered Stop   12/17/17 1700  vancomycin (VANCOCIN) IVPB 1000 mg/200 mL premix  Status:  Discontinued     1,000 mg 200 mL/hr over 60 Minutes Intravenous Every 24 hours 12/16/17 1939 12/17/17 1322   12/17/17 1400  vancomycin (VANCOCIN) IVPB 1000 mg/200 mL premix     1,000 mg 200 mL/hr over 60 Minutes Intravenous Every 24 hours 12/17/17 1322     12/17/17 0100  ceFEPIme (MAXIPIME) 1 g in sodium chloride 0.9 % 100 mL IVPB  1 g 200 mL/hr over 30 Minutes Intravenous Every 8 hours 12/16/17 1938     12/16/17 1945  vancomycin (VANCOCIN) 500 mg in sodium chloride 0.9 % 100 mL IVPB     500 mg 100 mL/hr over 60 Minutes Intravenous  Once 12/16/17 1938 12/16/17 2123   12/16/17 1845  vancomycin (VANCOCIN) IVPB 1000 mg/200 mL premix   Status:  Discontinued     1,000 mg 200 mL/hr over 60 Minutes Intravenous  Once 12/16/17 1842 12/16/17 1907   12/16/17 1845  ceFEPIme (MAXIPIME) 2 g in sodium chloride 0.9 % 100 mL IVPB  Status:  Discontinued     2 g 200 mL/hr over 30 Minutes Intravenous  Once 12/16/17 1842 12/16/17 1907   12/16/17 1700  vancomycin (VANCOCIN) IVPB 750 mg/150 ml premix  Status:  Discontinued     750 mg 150 mL/hr over 60 Minutes Intravenous  Once 12/16/17 1555 12/17/17 1321   12/16/17 1700  ceFEPIme (MAXIPIME) 1 g in sodium chloride 0.9 % 100 mL IVPB     1 g 200 mL/hr over 30 Minutes Intravenous  Once 12/16/17 1649 12/16/17 1729   12/16/17 1530  cefTRIAXone (ROCEPHIN) 1 g in sodium chloride 0.9 % 100 mL IVPB     1 g 200 mL/hr over 30 Minutes Intravenous  Once 12/16/17 1515 12/16/17 1555      Subjective:   [name] [name] seen and examined today.  Patient has no complaints this morning.  Denies chest pain, shortness breath, leg pain, nausea or vomiting, diarrhea or constipation.  Objective:   Vitals:   12/18/17 0303 12/18/17 0544 12/18/17 0550 12/18/17 0929  BP:  101/67    Pulse:  100    Resp:  20    Temp:  98.1 F (36.7 C)    TempSrc:      SpO2:  90%  93%  Weight: 60.8 kg  61.8 kg   Height:        Intake/Output Summary (Last 24 hours) at 12/18/2017 1037 Last data filed at 12/18/2017 0821 Gross per 24 hour  Intake 1373.74 ml  Output 250 ml  Net 1123.74 ml   Filed Weights   12/17/17 0646 12/18/17 0303 12/18/17 0550  Weight: 61.1 kg 60.8 kg 61.8 kg   Exam  General: Well developed, well nourished, NAD, appears stated age  75: NCAT, mucous membranes moist.   Neck: Supple  Cardiovascular: S1 S2 auscultated, no rubs, murmurs or gallops. Regular rate and rhythm.  Respiratory: Clear to auscultation bilaterally with equal chest rise  Abdomen: Soft, nontender, nondistended, + bowel sounds  Extremities: warm dry without cyanosis clubbing or edema  Neuro: AAOx2 (self and place), otherwise  nonfocal  Psych: Pleasant  Data Reviewed: I have personally reviewed following labs and imaging studies  CBC: Recent Labs  Lab 12/16/17 1146 12/17/17 0440 12/18/17 0407 12/18/17 0808  WBC 16.2* 17.7* 16.2*  --   NEUTROABS 10.6*  --   --   --   HGB 10.4* 9.0* 7.8* 8.2*  HCT 34.4* 29.1* 25.0* 26.0*  MCV 85.6 84.6 83.3  --   PLT 524* 526* 500*  --    Basic Metabolic Panel: Recent Labs  Lab 12/16/17 1146 12/17/17 0440 12/18/17 0407  NA 136 138 133*  K 3.9 4.0 4.0  CL 94* 99 97*  CO2 33* 29 26  GLUCOSE 130* 123* 97  BUN 9 10 8   CREATININE 0.70 0.65 0.65  CALCIUM 10.6* 10.3 9.7   GFR: Estimated Creatinine Clearance: 68.3 mL/min (by C-G formula  based on SCr of 0.65 mg/dL). Liver Function Tests: Recent Labs  Lab 12/16/17 1146  AST 17  ALT 13  ALKPHOS 84  BILITOT 0.5  PROT 7.7  ALBUMIN 2.6*   No results for input(s): LIPASE, AMYLASE in the last 168 hours. No results for input(s): AMMONIA in the last 168 hours. Coagulation Profile: Recent Labs  Lab 12/16/17 1146  INR 0.97   Cardiac Enzymes: No results for input(s): CKTOTAL, CKMB, CKMBINDEX, TROPONINI in the last 168 hours. BNP (last 3 results) No results for input(s): PROBNP in the last 8760 hours. HbA1C: No results for input(s): HGBA1C in the last 72 hours. CBG: No results for input(s): GLUCAP in the last 168 hours. Lipid Profile: No results for input(s): CHOL, HDL, LDLCALC, TRIG, CHOLHDL, LDLDIRECT in the last 72 hours. Thyroid Function Tests: No results for input(s): TSH, T4TOTAL, FREET4, T3FREE, THYROIDAB in the last 72 hours. Anemia Panel: No results for input(s): VITAMINB12, FOLATE, FERRITIN, TIBC, IRON, RETICCTPCT in the last 72 hours. Urine analysis:    Component Value Date/Time   COLORURINE RED (A) 12/16/2017 1510   APPEARANCEUR HAZY (A) 12/16/2017 1510   APPEARANCEUR Turbid (A) 01/25/2016 1407   LABSPEC 1.025 12/16/2017 1510   PHURINE 6.5 12/16/2017 1510   GLUCOSEU NEGATIVE 12/16/2017 1510    HGBUR LARGE (A) 12/16/2017 1510   BILIRUBINUR NEGATIVE 12/16/2017 1510   BILIRUBINUR Negative 01/25/2016 1407   KETONESUR TRACE (A) 12/16/2017 1510   PROTEINUR 100 (A) 12/16/2017 1510   UROBILINOGEN 0.2 05/04/2014 0956   NITRITE NEGATIVE 12/16/2017 1510   LEUKOCYTESUR LARGE (A) 12/16/2017 1510   LEUKOCYTESUR 3+ (A) 01/25/2016 1407   Sepsis Labs: @LABRCNTIP (procalcitonin:4,lacticidven:4)  ) Recent Results (from the past 240 hour(s))  Culture, blood (Routine x 2)     Status: None (Preliminary result)   Collection Time: 12/16/17 11:50 AM  Result Value Ref Range Status   Specimen Description BLOOD LEFT ANTECUBITAL  Final   Special Requests   Final    BOTTLES DRAWN AEROBIC AND ANAEROBIC Blood Culture results may not be optimal due to an excessive volume of blood received in culture bottles   Culture   Final    NO GROWTH 2 DAYS Performed at [facility], [address] [place], [place] [address]    Report Status PENDING  Incomplete  Culture, blood (Routine x 2)     Status: None (Preliminary result)   Collection Time: 12/16/17 11:56 AM  Result Value Ref Range Status   Specimen Description BLOOD LEFT FOREARM  Final   Special Requests   Final    BOTTLES DRAWN AEROBIC AND ANAEROBIC Blood Culture adequate volume   Culture   Final    NO GROWTH 2 DAYS Performed at [facility], [address], [place], [place] [address]    Report Status PENDING  Incomplete  Urine culture     Status: Abnormal (Preliminary result)   Collection Time: 12/16/17  3:10 PM  Result Value Ref Range Status   Specimen Description   Final    URINE, RANDOM Performed at [facility], [address] [place], [place] [address]    Special Requests   Final    NONE Performed at [facility], [address] [place], [place] [address]    Culture (A)  Final    40,000 COLONIES/mL PROTEUS MIRABILIS SUSCEPTIBILITIES TO FOLLOW Performed at [facility], [place] [address] [place],  [address]    Report Status  PENDING  Incomplete  MRSA PCR Screening     Status: Abnormal  Collection Time: 12/17/17  4:30 AM  Result Value Ref Range Status   MRSA by PCR POSITIVE (A) NEGATIVE Final    Comment:        The GeneXpert MRSA Assay (FDA approved for NASAL specimens only), is one component of a comprehensive MRSA colonization surveillance program. It is not intended to diagnose MRSA infection nor to guide or monitor treatment for MRSA infections. RESULT CALLED TO, READ BACK BY AND VERIFIED WITH: MURRELL,K RN AT 1696 12/17/17 BY TIBBITTS,K Performed at [facility] [address] [place], [place] [address]       Radiology Studies: Dg Chest 2 View  Result Date: 12/16/2017 CLINICAL DATA:  Suspected sepsis.  Cough and congestion. EXAM: CHEST - 2 VIEW COMPARISON:  October 16, 2017 FINDINGS: No pneumothorax. Scarring and nodularity in the right upper lung is stable since the April 23, 2017 CT. Opacity seen in the right mid lung, in the region of a right-sided EKG lead. Emphysematous changes in the lungs. The heart, hila, and mediastinum are unremarkable. No other acute abnormalities identified. IMPRESSION: 1. There is mild focal opacity in the right mid lung in the region of an EKG lead. A subtle focal infiltrate is possible. Recommend short-term follow-up to ensure resolution. 2. Scarring and nodularity in the right upper lobe is stable as above. 3. Emphysematous changes in the lungs. Electronically Signed   By: Dorise Bullion III M.D   On: 12/16/2017 13:05     Scheduled Meds: . albuterol  2.5 mg Nebulization BID  . Chlorhexidine Gluconate Cloth  6 each Topical Q0600  . citalopram  20 mg Oral Daily  . DULoxetine  60 mg Oral Daily  . magnesium oxide  400 mg Oral Daily  . montelukast  10 mg Oral QHS  . mupirocin ointment  1 application Nasal BID  . oxybutynin  5 mg Oral TID  . pantoprazole  40 mg Oral Daily  . roflumilast  500 mcg Oral Daily  . senna-docusate  1 tablet Oral BID    Continuous Infusions: . sodium chloride 50 mL/hr at 12/18/17 0528  . ceFEPime (MAXIPIME) IV 1 g (12/18/17 0821)  . vancomycin Stopped (12/17/17 1546)     LOS: 2 days   Time Spent in minutes   30 minutes  [place]kken Beach D.O. on 12/18/2017 at 10:37 AM  Between 7am to 7pm - Please see pager noted on [email or web address]  After 7pm go to [email or web address]  And look for the night coverage person covering for me after hours  [facility] Office  [phone]

## 2017-12-19 LAB — BASIC METABOLIC PANEL
Anion gap: 10 (ref 5–15)
BUN: 5 mg/dL — ABNORMAL LOW (ref 8–23)
CALCIUM: 10.3 mg/dL (ref 8.9–10.3)
CO2: 29 mmol/L (ref 22–32)
CREATININE: 0.57 mg/dL — AB (ref 0.61–1.24)
Chloride: 100 mmol/L (ref 98–111)
GFR calc Af Amer: >60 mL/min (ref >60)
GFR calc non Af Amer: >60 mL/min (ref >60)
GLUCOSE: 84 mg/dL (ref 70–99)
Potassium: 3.1 mmol/L — ABNORMAL LOW (ref 3.5–5.1)
Sodium: 139 mmol/L (ref 135–145)

## 2017-12-19 LAB — CBC
HEMATOCRIT: 27 % — AB (ref 39.0–52.0)
Hemoglobin: 8.4 g/dL — ABNORMAL LOW (ref 13.0–17.0)
MCH: 25.7 pg — ABNORMAL LOW (ref 26.0–34.0)
MCHC: 31.1 g/dL (ref 30.0–36.0)
MCV: 82.6 fL (ref 78.0–100.0)
Platelets: 520 10*3/uL — ABNORMAL HIGH (ref 150–400)
RBC: 3.27 MIL/uL — ABNORMAL LOW (ref 4.22–5.81)
RDW: 19.4 % — AB (ref 11.5–15.5)
WBC: 15.3 10*3/uL — ABNORMAL HIGH (ref 4.0–10.5)

## 2017-12-19 LAB — URINE CULTURE: Culture: 40000 — AB

## 2017-12-19 LAB — POTASSIUM: Potassium: 3.7 mmol/L (ref 3.5–5.1)

## 2017-12-19 MED ORDER — POTASSIUM CHLORIDE CRYS ER 20 MEQ PO TBCR
40.0000 meq | EXTENDED_RELEASE_TABLET | ORAL | Status: AC
  Administered 2017-12-19 (×2): 40 meq via ORAL
  Filled 2017-12-19 (×2): qty 2

## 2017-12-19 MED ORDER — CEPHALEXIN 500 MG PO CAPS: 500.0000 mg | ORAL_CAPSULE | Freq: Two times a day (BID) | ORAL | 0 refills | Status: AC

## 2017-12-19 NOTE — Progress Notes (Signed)
Pt returning to [facility] [place]. Report [id number]. Will need [facility] transport at DC and DC summary to be provided to SNF via the [facility] Discussed at length with pt's daughter. She reports she is considering moving pt home or to an ALF. CSW advised based on pt's care needs currently and historically, SNF appropriate, however going forward advised her to discuss with hospice providers and SNF. She agreed.   Sharren Bridge, MSW, LCSW Clinical Social Work 12/19/2017 [phone]

## 2017-12-19 NOTE — Progress Notes (Signed)
Pt's daughter returned call. Daughter states that she spoke with CSW. [name] states that she would like an update on her father. Will inform pt's RN.

## 2017-12-19 NOTE — [place] FL2 (Signed)
[place] MEDICAID FL2 LEVEL OF CARE SCREENING TOOL     IDENTIFICATION  Patient Name: [name] [name] Birthdate: 07-Dec-1940 Sex: male Admission Date (Current Location): 12/16/2017  [facility] and [place] Number:  Herbalist and Address:  [facility],  [place] [address]      Provider Number: [id number]  Attending Physician Name and Address:  Cristal Ford, DO  Relative Name and Phone Number:       Current Level of Care: Hospital Recommended Level of Care: [place] Prior Approval Number:    Date Approved/Denied:   PASRR Number: [id number]  Discharge Plan: SNF    Current Diagnoses: Patient Active Problem List   Diagnosis Date Noted  . Penile bleeding 12/16/2017  . Hypomagnesemia 08/08/2017  . Pneumonia 08/07/2017  . Lobar pneumonia ([place]) 08/07/2017  . Hypoxemia   . Transaminitis   . Encounter for hospice care discussion   . Encephalopathy acute 07/28/2017  . Palliative care by specialist   . Goals of care, counseling/discussion   . DNR (do not resuscitate) discussion   . Pulmonary edema, acute ([place]) 07/16/2017  . Acute blood loss anemia 07/16/2017  . Sepsis ([place]) 07/04/2017  . Hypokalemia 07/04/2017  . Leukocytosis 07/04/2017  . Urothelial carcinoma ([place]) 06/25/2017  . AKI (acute kidney injury) ([place])   . Severe anemia 04/22/2017  . Shortness of breath   . Acute on chronic respiratory failure ([place]) 10/24/2015  . Pressure ulcer 09/25/2015  . Respiratory failure ([place]) 09/24/2015  . Acute on chronic respiratory failure with hypercapnia ([place])   . Frequent falls 09/21/2015  . HLD (hyperlipidemia) 08/18/2015  . Chronic anemia 08/18/2015  . COPD exacerbation ([place]) 08/18/2015  . Chronic pain syndrome 05/01/2015  . Chronic respiratory failure with hypoxia ([place])   . Acute on chronic respiratory failure with hypoxia ([place]) 02/25/2014  . COPD (chronic obstructive pulmonary disease) ([place]) 02/09/2014  .  Hyperglycemia, drug-induced 03/26/2013  . Chronic back pain   . Anxiety 03/13/2012  . Hyperlipidemia 03/13/2012  . BPH (benign prostatic hyperplasia) 10/01/2011  . Tobacco abuse 09/29/2011  . Bladder cancer ([place]) 09/28/2011  . PULMONARY NODULE 12/27/2009  . Essential hypertension 12/26/2009  . OSTEOARTHRITIS 12/26/2009    Orientation RESPIRATION BLADDER Height & Weight     Self, Time, Situation, Place  (S) O2(2L) Incontinent Weight: 130 lb 15.3 oz (59.4 kg) Height:  5\' 5"  (165.1 cm)  BEHAVIORAL SYMPTOMS/MOOD NEUROLOGICAL BOWEL NUTRITION STATUS      Continent Diet(regular diet)  AMBULATORY STATUS COMMUNICATION OF NEEDS Skin   Extensive Assist Verbally Normal                       Personal Care Assistance Level of Assistance  Bathing, Feeding, Dressing Bathing Assistance: Maximum assistance Feeding assistance: Independent Dressing Assistance: Maximum assistance     Functional Limitations Info  Sight, Hearing, Speech Sight Info: Adequate Hearing Info: Impaired Speech Info: Adequate    SPECIAL CARE FACTORS FREQUENCY                       Contractures Contractures Info: Not present    Additional Factors Info  Code Status, Allergies Code Status Info: DNR Allergies Info: Metrizamide, Codeine, Iohexol, Ivp Dye Iodinated Diagnostic Agents, Other           Current Medications (12/19/2017):  This is the current hospital active medication list Current Facility-Administered Medications  Medication Dose Route Frequency Provider Last Rate Last Dose  . 0.9 %  sodium chloride infusion   Intravenous Continuous Cristal Ford, DO 50 mL/hr at 12/19/17 1655    . acetaminophen (TYLENOL) tablet 650 mg  650 mg Oral Q6H PRN Johnson-Pitts, Endia, MD   650 mg at 12/18/17 2118  . albuterol (PROVENTIL) (2.5 MG/3ML) 0.083% nebulizer solution 2.5 mg  2.5 mg Nebulization BID Johnson-Pitts, Endia, MD   2.5 mg at 12/19/17 0957  . albuterol (PROVENTIL) (2.5 MG/3ML) 0.083% nebulizer  solution 2.5 mg  2.5 mg Nebulization Q4H PRN Johnson-Pitts, Endia, MD      . ALPRAZolam Duanne Moron) tablet 1 mg  1 mg Oral TID PRN Johnson-Pitts, Endia, MD   1 mg at 12/18/17 1853  . alum & mag hydroxide-simeth (MAALOX/MYLANTA) 200-200-20 MG/5ML suspension 30 mL  30 mL Oral Q6H PRN Johnson-Pitts, Endia, MD   30 mL at 12/16/17 1646  . cefTRIAXone (ROCEPHIN) 2 g in sodium chloride 0.9 % 100 mL IVPB  2 g Intravenous Q24H Mikhail, Velta Addison, DO 200 mL/hr at 12/18/17 1542 2 g at 12/18/17 1542  . Chlorhexidine Gluconate Cloth 2 % PADS 6 each  6 each Topical Q0600 Johnson-Pitts, Endia, MD   6 each at 12/19/17 0623  . citalopram (CELEXA) tablet 20 mg  20 mg Oral Daily Johnson-Pitts, Endia, MD   20 mg at 12/19/17 0903  . DULoxetine (CYMBALTA) DR capsule 60 mg  60 mg Oral Daily Johnson-Pitts, Endia, MD   60 mg at 12/19/17 0903  . HYDROmorphone (DILAUDID) injection 0.5 mg  0.5 mg Intravenous Q4H PRN Johnson-Pitts, Endia, MD   0.5 mg at 12/16/17 1648  . magnesium oxide (MAG-OX) tablet 400 mg  400 mg Oral Daily Johnson-Pitts, Endia, MD   400 mg at 12/19/17 0903  . montelukast (SINGULAIR) tablet 10 mg  10 mg Oral QHS Johnson-Pitts, Endia, MD   10 mg at 12/18/17 2118  . mupirocin ointment (BACTROBAN) 2 % 1 application  1 application Nasal BID Johnson-Pitts, Endia, MD   1 application at 37/48/27 0903  . ondansetron (ZOFRAN) tablet 4 mg  4 mg Oral Q6H PRN Johnson-Pitts, Endia, MD       Or  . ondansetron (ZOFRAN) injection 4 mg  4 mg Intravenous Q6H PRN Johnson-Pitts, Endia, MD      . oxybutynin (DITROPAN) tablet 5 mg  5 mg Oral TID Johnson-Pitts, Endia, MD   5 mg at 12/19/17 0903  . oxyCODONE (Oxy IR/ROXICODONE) immediate release tablet 5 mg  5 mg Oral Q6H PRN Johnson-Pitts, Endia, MD   5 mg at 12/19/17 0903  . pantoprazole (PROTONIX) EC tablet 40 mg  40 mg Oral Daily Johnson-Pitts, Endia, MD   40 mg at 12/19/17 0903  . roflumilast (DALIRESP) tablet 500 mcg  500 mcg Oral Daily Johnson-Pitts, Endia, MD   500 mcg at  12/19/17 0903  . senna-docusate (Senokot-S) tablet 1 tablet  1 tablet Oral BID Johnson-Pitts, Endia, MD   1 tablet at 12/19/17 0903  . sodium phosphate (FLEET) 7-19 GM/118ML enema 1 enema  1 enema Rectal Once PRN Johnson-Pitts, Endia, MD         Discharge Medications: Please see discharge summary for a list of discharge medications.  Relevant Imaging Results:  Relevant Lab Results:   Additional Information SS# [place], [place]

## 2017-12-19 NOTE — Progress Notes (Signed)
Patient instruction/report called to Arbie Cookey, RN admissions. Questions, concerns denied.

## 2017-12-19 NOTE — Discharge Summary (Signed)
Physician Discharge Summary  [name] [name]:[id number] DOB: July 06, 1940 DOA: 12/16/2017  PCP: [name] Beach, MD  Admit date: 12/16/2017 Discharge date: 12/19/2017  Time spent: 45 minutes  Recommendations for Outpatient Follow-up:  Patient will be discharged to skilled nursing facility with hospice.  Patient will need to follow up with primary care provider within one week of discharge, CBC and BMP.  Follow up with urology at [facility]. Patient should continue medications as prescribed.  Patient should follow a regular diet.   Discharge Diagnoses:  Sepsis secondary to UTI with hematuria  Advanced bladder cancer COPD Hypotension Depression/Anxiety Normocytic anemia  Discharge Condition: Stable  Diet recommendation: Regular  Filed Weights   12/18/17 0303 12/18/17 0550 12/19/17 0623  Weight: 60.8 kg 61.8 kg 59.4 kg    History of present illness:  on 12/16/2017 by Dr. Wells Guiles Johnson-Pitts [name] Stalls Smithis a 77 y.o.malewith medical history significant ofhypertension COPD, chronic respiratory failure on nasal cannula, bladder cancer, anxiety depression who presents from the nursing home with penile bleeding. Per report the nursing home was unable to place a Foley. Patient was also unable to get a Foley placed here in the ED. UA was sent and showed red blood cells and also suggestive of urinary tract infection. Patient's last 2 urine cultures have been negative with no growth. he was tachycardic and had soft blood pressures uponpresentation tothe ED. He also has significant leukocytosis of 16,000. Patient was empirically given vancomycin and Rocephin in the ED. The 1 L of IV fluids now completed his sepsis bolus with additional 500 transfused. He has also been having increased rhonchi. He states his baseline shortness of breath is unchanged. Chest x-ray showed questionable right sided infiltrate. Patient complains mainly of just heartburn andpenile pain. Denies any fever  but does have a cough.   Hospital Course:  Sepsis secondary to UTI with hematuria  -Patient with tachycardia, tachypnea, leukocytosis, elevated lactic acid 3.3 -UA shows large leukocytes, many bacteria, 21-50 WBC -Urine culture 40K colonies of proteus mirabilis -Blood cultures showed no growth to date -Was placed on vancomycin and cefepime and transitioned to ceftriaxone -lactic acid improved -Will discharge with keflex 500mg  BID - continue for additional 7 days -repeat CBC in one week  Advanced bladder cancer -follows with urology, Dr. Gloriann [name] and [facility] -Urology consulted and appreciated, recommended treating this is a urinary tract infection.  Unfortunately no local therapy can be offered.  Patient does not have a good prognosis.  Okay to send him home with gross hematuria. No need for catheter at this time as patient has been able to urinate on his own. -Of note patient has had previous BCG, is not a candidate for cystectomy -Foley catheter was unable to be placed by the emergency department on 12/16/2016 -patient [facility]  COPD -Currently stable, no wheezing on exam -Continue Daliresp, Singulair  Hypotension -Hold Lasix, placed on IV fluid and continue to monitor closely -BP has improved  Depression/Anxiety -Continue Celexa, Cymbalta, and Ativan as needed  Normocytic anemia -Baseline hemoglobin approximately 9-10, currently 8.4 -Feel drop in hemoglobin is likely dilutional component given that patient has received IVF -repeat CBC  Hypokalemia -resolved with replacement -repeat BMP in one week  Procedures: None  Consultations: Urology  Discharge Exam: Vitals:   12/19/17 0623 12/19/17 0957  BP: 103/79   Pulse: 91   Resp: 16   Temp: 97.9 F (36.6 C)   SpO2: 95% 95%   Denies any chest pain, shortness of breath, abdominal pain, nausea  or vomiting, diarrhea constipation.  Would like to go home today.   General: Well developed, well  nourished, NAD, appears stated age  68: NCAT, mucous membranes moist.  Neck: Supple  Cardiovascular: S1 S2 auscultated,RRR  Respiratory: Clear to auscultation bilaterally with equal chest rise  Abdomen: Soft, nontender, nondistended, + bowel sounds  Extremities: warm dry without cyanosis clubbing or edema  Neuro: AAOx3, nonfocal  Psych: Normal affect and demeanor  Discharge Instructions Discharge Instructions    Discharge instructions   Complete by:  As directed    Patient will be discharged to skilled nursing facility with hospice.  Patient will need to follow up with primary care provider within one week of discharge, CBC and BMP.  Follow up with urology at [facility]. Patient should continue medications as prescribed.  Patient should follow a regular diet.     Allergies as of 12/19/2017      Reactions   Metrizamide Hives   Codeine    Iohexol Hives    Code: HIVES, Desc: PT STATES HE BROKE OUT IN HIVES AND RASH AFTER CT NECK EARLY SEPT 2011; NO RESP PROBLEMS; NEEDS PRE-MEDS; MKS, Onset Date: [id number]   Ivp Dye [iodinated Diagnostic Agents] Hives   Other    Broccoli, Cauliflower       Medication List    STOP taking these medications   amoxicillin-clavulanate 875-125 MG tablet Commonly known as:  AUGMENTIN   predniSONE 10 MG tablet Commonly known as:  DELTASONE     TAKE these medications   acetaminophen 325 MG tablet Commonly known as:  TYLENOL Take 650 mg by mouth every 6 (six) hours as needed for headache.   albuterol (2.5 MG/3ML) 0.083% nebulizer solution Commonly known as:  PROVENTIL Take 3 mLs (2.5 mg total) by nebulization every 4 (four) hours as needed. For shortness of breath What changed:  when to take this   ALPRAZolam 1 MG tablet Commonly known as:  XANAX Take 1 tablet (1 mg total) by mouth 3 (three) times daily as needed for anxiety.   cephALEXin 500 MG capsule Commonly known as:  KEFLEX Take 1 capsule (500 mg total) by mouth 2 (two) times  daily for 7 days.   citalopram 20 MG tablet Commonly known as:  CELEXA Take 20 mg by mouth daily.   DULoxetine 60 MG capsule Commonly known as:  CYMBALTA Take 1 capsule (60 mg total) by mouth daily.   furosemide 20 MG tablet Commonly known as:  LASIX Take 20 mg by mouth daily.   magnesium oxide 400 (241.3 Mg) MG tablet Commonly known as:  MAG-OX Take 1 tablet (400 mg total) by mouth daily.   montelukast 10 MG tablet Commonly known as:  SINGULAIR Take 10 mg by mouth at bedtime.   omeprazole 20 MG capsule Commonly known as:  PRILOSEC Take 20 mg by mouth daily.   oxybutynin 5 MG tablet Commonly known as:  DITROPAN Take 5 mg by mouth 3 (three) times daily.   oxyCODONE 5 MG immediate release tablet Commonly known as:  Oxy IR/ROXICODONE Take 1 tablet (5 mg total) by mouth every 6 (six) hours as needed for severe pain.   OXYGEN Inhale 6 L into the lungs continuous.   roflumilast 500 MCG Tabs tablet Commonly known as:  DALIRESP Take 1 Tablet by mouth once daily   senna-docusate 8.6-50 MG tablet Commonly known as:  Senokot-S Take 1 tablet by mouth 2 (two) times daily.      Allergies  Allergen Reactions  . Metrizamide  Hives  . Codeine   . Iohexol Hives     Code: HIVES, Desc: PT STATES HE BROKE OUT IN HIVES AND RASH AFTER CT NECK EARLY SEPT 2011; NO RESP PROBLEMS; NEEDS PRE-MEDS; MKS, Onset Date: [id number]   . Ivp Dye [Iodinated Diagnostic Agents] Hives  . Other     Broccoli, Cauliflower     Contact information for follow-up providers    [name] Beach, MD. Schedule an appointment as soon as possible for a visit.   Specialty:  Family Medicine Why:  Hospital follow up Contact information: [address]  [place] [address] [phone]            Contact information for after-discharge care    Destination    [facility] .   Service:  Skilled Nursing Contact information: [place] [phone]                   The results of significant diagnostics from this hospitalization (including imaging, microbiology, ancillary and laboratory) are listed below for reference.    Significant Diagnostic Studies: Dg Chest 2 View  Result Date: 12/16/2017 CLINICAL DATA:  Suspected sepsis.  Cough and congestion. EXAM: CHEST - 2 VIEW COMPARISON:  October 16, 2017 FINDINGS: No pneumothorax. Scarring and nodularity in the right upper lung is stable since the April 23, 2017 CT. Opacity seen in the right mid lung, in the region of a right-sided EKG lead. Emphysematous changes in the lungs. The heart, hila, and mediastinum are unremarkable. No other acute abnormalities identified. IMPRESSION: 1. There is mild focal opacity in the right mid lung in the region of an EKG lead. A subtle focal infiltrate is possible. Recommend short-term follow-up to ensure resolution. 2. Scarring and nodularity in the right upper lobe is stable as above. 3. Emphysematous changes in the lungs. Electronically Signed   By: Dorise Bullion III M.D   On: 12/16/2017 13:05    Microbiology: Recent Results (from the past 240 hour(s))  Culture, blood (Routine x 2)     Status: None (Preliminary result)   Collection Time: 12/16/17 11:50 AM  Result Value Ref Range Status   Specimen Description BLOOD LEFT ANTECUBITAL  Final   Special Requests   Final    BOTTLES DRAWN AEROBIC AND ANAEROBIC Blood Culture adequate volume   Culture   Final    NO GROWTH 3 DAYS Performed at [facility], [address], [place], [place] [address]    Report Status PENDING  Incomplete  Culture, blood (Routine x 2)     Status: None (Preliminary result)   Collection Time: 12/16/17 11:56 AM  Result Value Ref Range Status   Specimen Description BLOOD LEFT FOREARM  Final   Special Requests   Final    BOTTLES DRAWN AEROBIC AND ANAEROBIC Blood Culture adequate volume   Culture   Final    NO GROWTH 3 DAYS Performed at [facility],  [address], [place], [place] [address]    Report Status PENDING  Incomplete  Urine culture     Status: Abnormal   Collection Time: 12/16/17  3:10 PM  Result Value Ref Range Status   Specimen Description   Final    URINE, RANDOM Performed at [facility], [address], [place], [place] [address]    Special Requests   Final    NONE Performed at [facility], [address], [place],  [address]    Culture 40,000 COLONIES/mL PROTEUS MIRABILIS (A)  Final   Report Status 12/19/2017 FINAL  Final   Organism ID, Bacteria PROTEUS MIRABILIS (A)  Final      Susceptibility   Proteus mirabilis - MIC*    AMPICILLIN >=32 RESISTANT Resistant     CEFAZOLIN 8 SENSITIVE Sensitive     CEFTRIAXONE <=1 SENSITIVE Sensitive     CIPROFLOXACIN >=4 RESISTANT Resistant     GENTAMICIN 8 INTERMEDIATE Intermediate     IMIPENEM 2 SENSITIVE Sensitive     NITROFURANTOIN 128 RESISTANT Resistant     TRIMETH/SULFA >=320 RESISTANT Resistant     AMPICILLIN/SULBACTAM >=32 RESISTANT Resistant     PIP/TAZO <=4 SENSITIVE Sensitive     * 40,000 COLONIES/mL PROTEUS MIRABILIS  MRSA PCR Screening     Status: Abnormal   Collection Time: 12/17/17  4:30 AM  Result Value Ref Range Status   MRSA by PCR POSITIVE (A) NEGATIVE Final    Comment:        The GeneXpert MRSA Assay (FDA approved for NASAL specimens only), is one component of a comprehensive MRSA colonization surveillance program. It is not intended to diagnose MRSA infection nor to guide or monitor treatment for MRSA infections. RESULT CALLED TO, READ BACK BY AND VERIFIED WITH: MURRELL,K RN AT 7124 12/17/17 BY TIBBITTS,K Performed at [facility] [address] [place], [place] [address]      Labs: Basic Metabolic Panel: Recent Labs  Lab 12/16/17 1146 12/17/17 0440 12/18/17 0407 12/19/17 0420 12/19/17 1404  NA 136 138 133* 139  --   K 3.9 4.0 4.0 3.1* 3.7  CL 94* 99 97* 100  --   CO2 33* 29 26 29   --   GLUCOSE 130* 123* 97  84  --   BUN 9 10 8  5*  --   CREATININE 0.70 0.65 0.65 0.57*  --   CALCIUM 10.6* 10.3 9.7 10.3  --    Liver Function Tests: Recent Labs  Lab 12/16/17 1146  AST 17  ALT 13  ALKPHOS 84  BILITOT 0.5  PROT 7.7  ALBUMIN 2.6*   No results for input(s): LIPASE, AMYLASE in the last 168 hours. No results for input(s): AMMONIA in the last 168 hours. CBC: Recent Labs  Lab 12/16/17 1146 12/17/17 0440 12/18/17 0407 12/18/17 0808 12/19/17 0420  WBC 16.2* 17.7* 16.2*  --  15.3*  NEUTROABS 10.6*  --   --   --   --   HGB 10.4* 9.0* 7.8* 8.2* 8.4*  HCT 34.4* 29.1* 25.0* 26.0* 27.0*  MCV 85.6 84.6 83.3  --  82.6  PLT 524* 526* 500*  --  520*   Cardiac Enzymes: No results for input(s): CKTOTAL, CKMB, CKMBINDEX, TROPONINI in the last 168 hours. BNP: BNP (last 3 results) Recent Labs    07/16/17 1333 07/28/17 0835 08/06/17 2139  BNP 261.0* 162.0* 170.0*    ProBNP (last 3 results) No results for input(s): PROBNP in the last 8760 hours.  CBG: No results for input(s): GLUCAP in the last 168 hours.     Signed:  Cristal Ford  [facility] 12/19/2017, 2:51 PM

## 2017-12-19 NOTE — Progress Notes (Signed)
Pt's daughter called Lynwood Kubisiak and asked for CM to return her call.  Four calls were made with no answer to Johnell Comings [phone]. The home number is not correct in Epic.

## 2017-12-21 LAB — CULTURE, BLOOD (ROUTINE X 2)
CULTURE: NO GROWTH
Culture: NO GROWTH
SPECIAL REQUESTS: ADEQUATE
SPECIAL REQUESTS: ADEQUATE

## 2018-01-13 DEATH — deceased

## 2018-06-30 IMAGING — DX DG CHEST 2V
2 series · 2 of 2 positions shown · non-contrast
Comparison: 07/28/2017

CLINICAL DATA: Patient was diagnosed today with pneumonia at an
outside facility. Here for re-evaluation. History of COPD and
asthma.

EXAM:
CHEST - 2 VIEW

[chest ap]
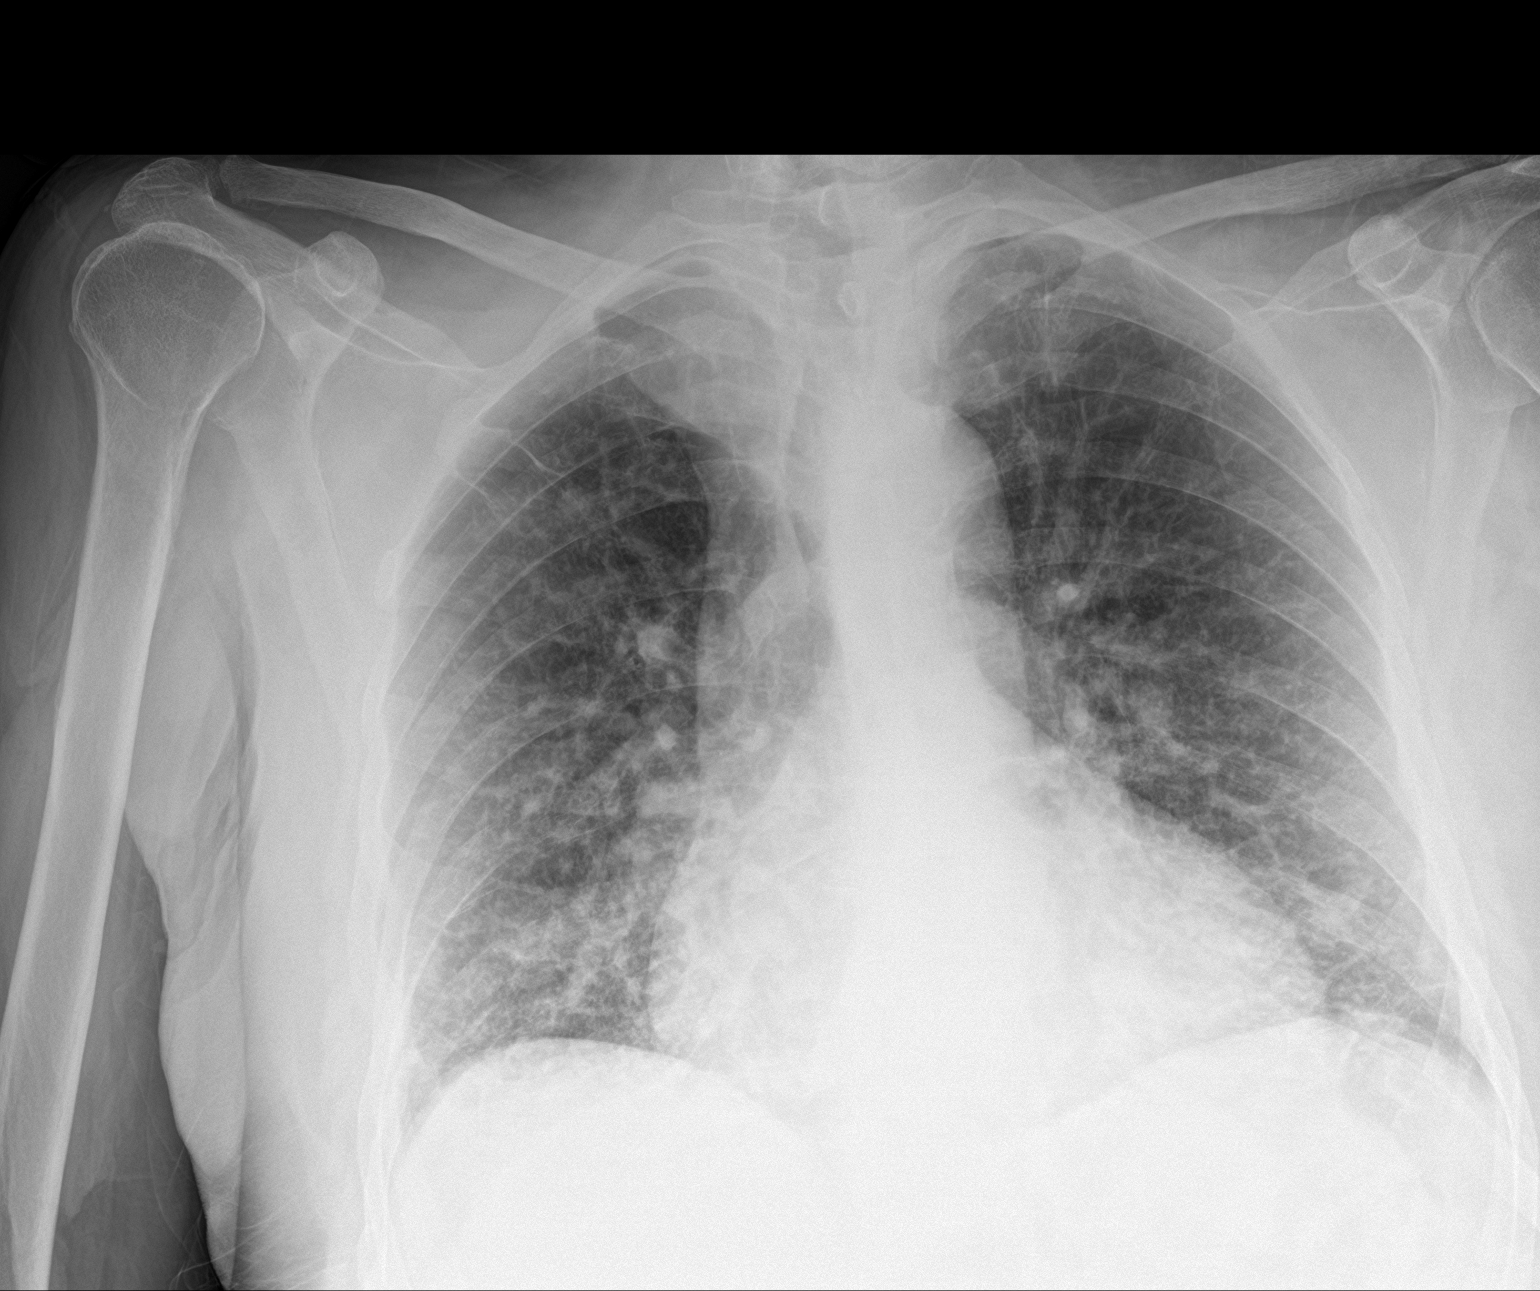

[chest lat]
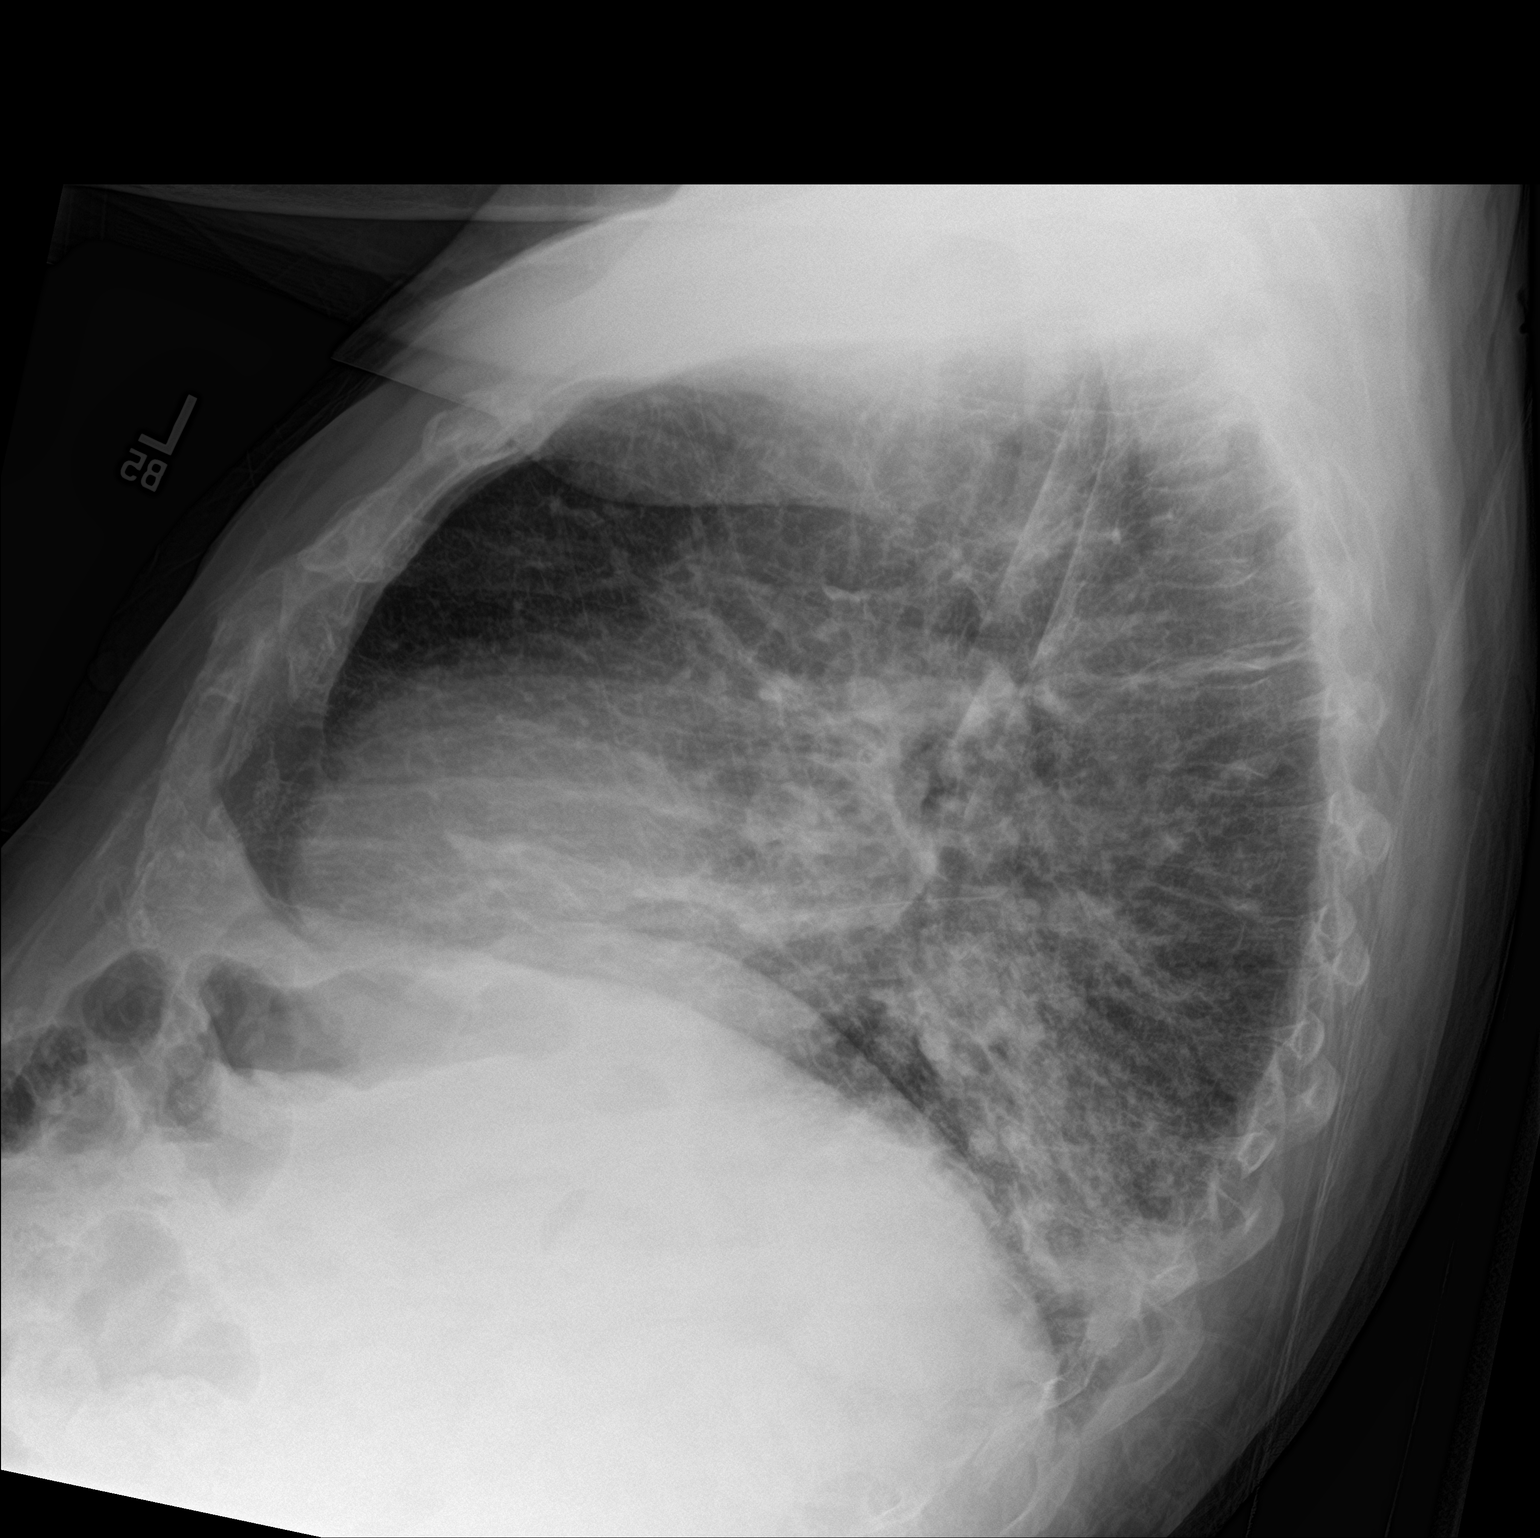

[2 of 2 positions shown; findings below may reference images not displayed]

FINDINGS: Cardiac enlargement with mild pulmonary vascular congestion. Diffuse
interstitial and alveolar infiltrates may indicate pneumonia or
edema. There is more dense infiltration in the left lower lung
posteriorly suggesting superimposed pneumonia. No blunting of
costophrenic angles. No pneumothorax. Mediastinal contours appear
intact. Calcification of the aorta.
IMPRESSION: 1. Cardiac enlargement with pulmonary vascular congestion and
diffuse interstitial and alveolar infiltration suggesting edema.
This could also represent multifocal pneumonia.
2. Focal area of more dense infiltration in the left lower lung
posteriorly suggest superimposed pneumonia.
